# Patient Record
Sex: Male | Born: 1950 | Race: Black or African American | Hispanic: No | Smoking: Former smoker
Health system: Southern US, Community
[De-identification: ages and names within clinical notes are randomized; demographics above are authoritative.]

## PROBLEM LIST (undated history)

## (undated) DIAGNOSIS — I639 Cerebral infarction, unspecified: Secondary | ICD-10-CM

## (undated) DIAGNOSIS — I219 Acute myocardial infarction, unspecified: Secondary | ICD-10-CM

## (undated) DIAGNOSIS — R0602 Shortness of breath: Secondary | ICD-10-CM

## (undated) DIAGNOSIS — I10 Essential (primary) hypertension: Secondary | ICD-10-CM

## (undated) DIAGNOSIS — K219 Gastro-esophageal reflux disease without esophagitis: Secondary | ICD-10-CM

## (undated) DIAGNOSIS — N289 Disorder of kidney and ureter, unspecified: Secondary | ICD-10-CM

## (undated) DIAGNOSIS — E119 Type 2 diabetes mellitus without complications: Secondary | ICD-10-CM

## (undated) DIAGNOSIS — I509 Heart failure, unspecified: Secondary | ICD-10-CM

## (undated) DIAGNOSIS — E7800 Pure hypercholesterolemia, unspecified: Secondary | ICD-10-CM

## (undated) DIAGNOSIS — Z889 Allergy status to unspecified drugs, medicaments and biological substances status: Secondary | ICD-10-CM

## (undated) HISTORY — PX: CATARACT EXTRACTION W/ INTRAOCULAR LENS  IMPLANT, BILATERAL: SHX1307

---

## 1999-06-24 ENCOUNTER — Encounter: Admission: RE | Admit: 1999-06-24 | Discharge: 1999-09-22 | Payer: Self-pay | Admitting: Unknown Physician Specialty

## 2001-09-13 ENCOUNTER — Encounter: Admission: RE | Admit: 2001-09-13 | Discharge: 2001-12-12 | Payer: Self-pay | Admitting: Family Medicine

## 2006-03-08 HISTORY — PX: CORONARY ARTERY BYPASS GRAFT: SHX141

## 2006-04-28 ENCOUNTER — Ambulatory Visit: Payer: Self-pay | Admitting: Vascular Surgery

## 2006-04-28 ENCOUNTER — Encounter (INDEPENDENT_AMBULATORY_CARE_PROVIDER_SITE_OTHER): Payer: Self-pay | Admitting: Interventional Cardiology

## 2006-04-28 ENCOUNTER — Inpatient Hospital Stay (HOSPITAL_COMMUNITY): Admission: AD | Admit: 2006-04-28 | Discharge: 2006-04-29 | Payer: Self-pay | Admitting: Internal Medicine

## 2006-04-28 ENCOUNTER — Encounter: Payer: Self-pay | Admitting: Vascular Surgery

## 2006-05-01 ENCOUNTER — Emergency Department (HOSPITAL_COMMUNITY): Admission: EM | Admit: 2006-05-01 | Discharge: 2006-05-02 | Payer: Self-pay | Admitting: Emergency Medicine

## 2006-12-29 ENCOUNTER — Inpatient Hospital Stay (HOSPITAL_COMMUNITY): Admission: AD | Admit: 2006-12-29 | Discharge: 2007-01-07 | Payer: Self-pay | Admitting: Cardiology

## 2006-12-29 ENCOUNTER — Ambulatory Visit: Payer: Self-pay | Admitting: Surgery

## 2006-12-29 ENCOUNTER — Ambulatory Visit: Payer: Self-pay | Admitting: Cardiothoracic Surgery

## 2006-12-30 ENCOUNTER — Encounter (INDEPENDENT_AMBULATORY_CARE_PROVIDER_SITE_OTHER): Payer: Self-pay | Admitting: Cardiology

## 2007-01-27 ENCOUNTER — Encounter: Admission: RE | Admit: 2007-01-27 | Discharge: 2007-01-27 | Payer: Self-pay | Admitting: Cardiothoracic Surgery

## 2007-01-27 ENCOUNTER — Ambulatory Visit: Payer: Self-pay | Admitting: Cardiothoracic Surgery

## 2007-03-09 DIAGNOSIS — I639 Cerebral infarction, unspecified: Secondary | ICD-10-CM

## 2007-03-09 HISTORY — DX: Cerebral infarction, unspecified: I63.9

## 2007-03-27 ENCOUNTER — Ambulatory Visit (HOSPITAL_COMMUNITY): Admission: RE | Admit: 2007-03-27 | Discharge: 2007-03-27 | Payer: Self-pay | Admitting: Cardiology

## 2009-04-02 ENCOUNTER — Emergency Department (HOSPITAL_COMMUNITY)
Admission: EM | Admit: 2009-04-02 | Discharge: 2009-04-02 | Payer: Self-pay | Source: Home / Self Care | Admitting: Emergency Medicine

## 2010-03-23 ENCOUNTER — Encounter (HOSPITAL_COMMUNITY): Admission: RE | Admit: 2010-03-23 | Payer: Self-pay | Source: Home / Self Care | Admitting: Cardiology

## 2010-04-08 ENCOUNTER — Encounter: Payer: Self-pay | Admitting: Cardiology

## 2010-05-24 LAB — GLUCOSE, CAPILLARY: Glucose-Capillary: 208 mg/dL — ABNORMAL HIGH (ref 70–99)

## 2010-05-24 LAB — COMPREHENSIVE METABOLIC PANEL WITH GFR
ALT: 13 U/L (ref 0–53)
AST: 16 U/L (ref 0–37)
Albumin: 3.7 g/dL (ref 3.5–5.2)
Alkaline Phosphatase: 86 U/L (ref 39–117)
BUN: 2 mg/dL — ABNORMAL LOW (ref 6–23)
CO2: 30 meq/L (ref 19–32)
Calcium: 8.6 mg/dL (ref 8.4–10.5)
Chloride: 101 meq/L (ref 96–112)
Creatinine, Ser: 0.82 mg/dL (ref 0.4–1.5)
GFR calc non Af Amer: >60 mL/min
Glucose, Bld: 234 mg/dL — ABNORMAL HIGH (ref 70–99)
Potassium: 3.9 meq/L (ref 3.5–5.1)
Sodium: 136 meq/L (ref 135–145)
Total Bilirubin: 0.5 mg/dL (ref 0.3–1.2)
Total Protein: 6.7 g/dL (ref 6.0–8.3)

## 2010-05-24 LAB — DIFFERENTIAL
Eosinophils Absolute: 0.3 10*3/uL (ref 0.0–0.7)
Lymphocytes Relative: 40 % (ref 12–46)
Monocytes Relative: 7 % (ref 3–12)
Neutro Abs: 2.7 10*3/uL (ref 1.7–7.7)

## 2010-05-24 LAB — CBC
HCT: 32.7 % — ABNORMAL LOW (ref 39.0–52.0)
Hemoglobin: 10.8 g/dL — ABNORMAL LOW (ref 13.0–17.0)
MCHC: 33.1 g/dL (ref 30.0–36.0)
MCV: 84.7 fL (ref 78.0–100.0)
Platelets: 159 K/uL (ref 150–400)
RBC: 3.86 MIL/uL — ABNORMAL LOW (ref 4.22–5.81)
RDW: 14 % (ref 11.5–15.5)
WBC: 6 K/uL (ref 4.0–10.5)

## 2010-07-21 NOTE — Discharge Summary (Signed)
[name], [name]                ACCOUNT NO.:  [id number]   MEDICAL RECORD NO.:  [id number]          PATIENT TYPE:  INP   LOCATION:  2007                         FACILITY:  [facility]   PHYSICIAN:  Ivin Poot, M.D.  DATE OF BIRTH:  1950-05-18   DATE OF ADMISSION:  12/29/2006  DATE OF DISCHARGE:                               DISCHARGE SUMMARY   HISTORY OF PRESENT ILLNESS:  The patient is a 60 year old black male  with a history of diabetes and hypertension who was recently felt to  require admission for evaluation of coronary artery disease.  The  patient has multiple risk factors including the diabetes, hypertension,  hyperlipidemia and a previous stroke of the right sided basal ganglia in  February 2008.  The patient had a 2D echocardiogram which revealed a  well preserved left ventricular function without valvular insufficiency.  He recently has developed exertional chest pain that was worrisome for  angina and doctor recommended admission for cardiac catheterization.  He  was admitted for this and further treatment pending the results of this  study.   PAST MEDICAL HISTORY:  1. Hypertension.  2. Diabetes mellitus.  3. History of tobacco use, quit 10 years ago.  4. History of alcohol abuse.  5. History of CVA as described above.  6. History of diabetic neuropathy.   HOME MEDICATIONS:  Included:  1. Toprol XL 50 mg daily.  2. Enalapril 10 mg daily.  3. Aspirin 81 mg daily.  4. Plavix 75 mg daily.  5. Metformin 1 gram b.i.d.  6. Glipizide 10 mg daily.  7. Neurontin 300 mg at bedtime.  8. Nitroglycerin sublingual 0.4 mg p.r.n.   ALLERGIES:  None.   FAMILY HISTORY:  Please see the history and physical done at the time of  admission.   SOCIAL HISTORY:  Please see the history and physical done at the time of  admission.   REVIEW OF SYSTEMS:  Please see the history and physical done at the time  of admission.   PHYSICAL EXAMINATION:  Please see the history and physical  done at the  time of admission.   HOSPITAL COURSE:  The patient was admitted for the cardiac  catheterization as described.  This was performed on the day of  admission.  This demonstrated multivessel coronary artery disease with a  70-80% stenosis of the LAD and 80% stenosis of the ramus intermediate  and distal 80-90% stenosis of the right coronary artery extending into  the posterior descending and posterolateral bifurcation.  Left  ventricular ejection fraction was 50% with some inferior wall  hypokinesia and no evidence of mitral regurgitation or aortic stenosis.  Based on the coronary anatomy and symptoms, he was recommended surgical  revascularization.  Dr. Tharon Aquas Trigt evaluated the patient and his  studies and agreed with recommendations.  The patient was felt to  require a short term Plavix washout time period but was otherwise stable  medically and the surgery was done on January 02, 2007.  The following  procedure was performed: Coronary artery bypass grafting x4, the  following grafts were placed:  (  1) left internal mammary artery to the  LAD, (2) saphenous vein graft to the posterior descending, (3) a  saphenous vein graft to the ramus and (4) a saphenous vein graft to the  obtuse marginal #1.  The patient tolerated the procedure well, was taken  to the surgical intensive care unit in stable condition.   POSTOPERATIVE HOSPITAL COURSE:  The patient has overall done well.  His  inotropic support was weaned without difficulty.  He was weaned from the  ventilator without difficulty.  All routine lines, monitors and drainage  devices have been discontinued in the standard fashion.  He initially  did have some junctional rhythm but this has resolved.  He now maintains  normal sinus rhythm.  All routine lines, monitors and drainage devices  have been discontinued in the standard fashion.  His incisions are  healing well without evidence of infection.  He is tolerating  cardiac  rehabilitation phase 1 modalities per standard protocols.  He does have  an acute blood loss anemia.  His hemoglobin and hematocrit has  stabilized and he has been started on iron.  His most recent values on  January 06, 2007 are 7.6 and 22.8.  His electrolytes, BUN and creatinine  are stable and within normal limits.  He has had some postoperative  fever.  It was felt primarily to be related to a pulmonary source.  He  does have a slight infiltrate on is left lower lobe with some  atelectasis.  He has been started on a course of Avelox which will be  continued as an outpatient.  His diabetes is under adequate control.  Tentatively he is felt stable for discharge in the morning of January 07, 2007 pending morning round reevaluation.   INSTRUCTIONS:  The patient will receive read instructions in regards to  medications, activity, diet, wound care and followup.  Followup will  include Dr. Terrence Dupont 2 weeks, Dr. Prescott Gum on January 27, 2007 at 12:15  with a chest x-ray.   MEDICATIONS:  At the time of this dictation for discharge will be as  follows:  1. Aspirin 81 mg daily.  2. Lopressor 25 mg twice daily.  3. Zocor 20 mg daily.  4. Neurontin 300 mg daily at bedtime.  5. Glucotrol 10 mg daily.  6. Plavix 75 mg daily.  7. Nu-Iron 150 mg daily.  8. Glucophage 1 gram twice daily.  9. Avelox 400 mg daily for 5 additional days.  10.Lasix 40 mg daily for 10 days.  11.K-Dur 20 mEq daily for 10 days.  12.For pain, oxycodone 1 to 2 every 6 hours as needed.   FINAL DIAGNOSIS:  Include the following:  Severe multivessel coronary  artery disease as described, now status post surgical revascularization.   OTHER DIAGNOSES:  Include:  1. Hypertension.  2. Hyperlipidemia.  3. Noninsulin dependent diabetes.  4. Hypercholesterolemia.  5. History of cerebrovascular accident.  6. History of tobacco abuse.  7. History of alcohol abuse.  8. Positive family history for coronary artery  disease.  9. Postoperative acute blood loss anemia.  10.Postoperative fever with presumed pulmonary etiology which is      improving.      John Giovanni, P.A.-C.      Ivin Poot, M.D.  Electronically Signed    WEG/MEDQ  D:  01/06/2007  T:  01/07/2007  Job:  [id number]   cc:   Ivin Poot, M.D.  Allegra Lai. Terrence Dupont, M.D.  Robert A. Alyson Ingles, M.D.

## 2010-07-21 NOTE — Assessment & Plan Note (Signed)
OFFICE VISIT   [name], [name]  DOB:  Aug 11, 1950                                        January 27, 2007  CHART #:  [id number]   CURRENT PROBLEMS:  1. CABG x4 for severe 3-vessel coronary artery disease with unstable      angina.  2. Hypertension status post CVA.  3. Diabetes.   HISTORY OF PRESENT ILLNESS:  Mr. Steininger is a 60 year old black male who  returns from his first office visit after undergoing urgent 4 vessel  coronary artery bypass grafting on 01/01/06 for unstable angina with 3  vessel disease.  He is 60 years old who presented with dyspnea on  exertion and progressive chest pain.  He was found by Dr. Terrence Dupont to  have 3 vessel disease.  He underwent left IMA grafting due to his LAD  being grafted in the ramus intermediate circumflex marginal and  posterior descending and had an uncomplicated postoperative recovery in  the hospital.  He was discharged home on the 5th postoperative day in  sinus rhythm on Lopressor 25 b.i.d., Zocor 20 mg, aspirin 81 mg,  Glucotrol, Glucophage and short care of Lasix and potassium.  He has had  no recurrent chest pain since returning home and no CHF symptoms.  The  surgical incisions are healing well.  He is anxious to increase his  activity level.   On exam his blood pressure is 126/80, pulse 90, respirations 18,  saturation 98%.  He is alert and oriented.  Breath sounds are clear and  equal.  The sternum is stable and well-healed.  Cardiac rhythm is  regular without S3 gallop or rub.  The leg incision is well healed and  there is no peripheral edema.  There is no neurologic motor deficit on  exam.   His chest x-ray reveals stable cardiac silhouette with sternal wires  well aligned.  There is no pleural effusion and some minimal atelectasis  at the left costophrenic angle.   PLAN:  The patient is recommended to resume driving and light  activities.  He knows not to lift more than 20 pounds until mid  January.  He was encouraged to start the cardiac rehab program  or to take a daily walk on his own of 20 minutes.  He will maintain his  current medications.  He will return here as needed.   Ivin Poot, M.D.  Electronically Signed   PV/MEDQ  D:  01/27/2007  T:  01/28/2007  Job:  [id number]   cc:   Allegra Lai. Terrence Dupont, M.D.  [facility]

## 2010-07-21 NOTE — Op Note (Signed)
[name], [name]                ACCOUNT NO.:  [id number]   MEDICAL RECORD NO.:  [id number]          PATIENT TYPE:  INP   LOCATION:  2307                         FACILITY:  [facility]   PHYSICIAN:  Ivin Poot, M.D.  DATE OF BIRTH:  [birth year]   DATE OF PROCEDURE:  01/02/2007  DATE OF DISCHARGE:                               OPERATIVE REPORT   OPERATION:  1. Coronary artery bypass grafting x4 (left internal mammary artery to      left anterior descending, saphenous vein graft to ramus      intermediate, saphenous vein graft to circumflex marginal,      saphenous vein graft to posterior descending).   SURGEON:  Ivin Poot, M.D.   ASSISTANT:  Gwendolyn Lima MD and Crista Curb, surgical assistant.   PRE AND POSTOPERATIVE DIAGNOSIS:  Severe three-vessel coronary disease  with unstable angina.   ANESTHESIA:  General.   INDICATIONS:  The patient is a 60 year old hypertensive male who  presents with chest pain and progressive dyspnea on exertion with prior  history of coronary disease and percutaneous intervention.  Cardiac  catheterization by Dr. Terrence Dupont demonstrated severe three-vessel coronary  disease with fairly well-preserved LV function.  He is felt to be  candidate for surgical revascularization.  Prior to surgery I examined  the patient in his CCU room and reviewed results of the results of the  cardiac cath and preoperative echo with the patient.  I discussed  indications, benefits and alternatives to coronary artery bypass surgery  for treatment of his coronary disease.  I also reviewed the major  aspects of planned procedure including the choice of conduit, location  of the surgical incisions, use of general anesthesia and cardiopulmonary  bypass, and expected postoperative recovery.  I reviewed with [name]  the risks to him of coronary artery bypass surgery including risks of  MI, CVA, bleeding, infection, and death.  After reviewing these issues,  he  demonstrated his understanding and agreed to proceed with operation  as planned under what I felt was an informed consent.   OPERATIVE FINDINGS:  The vein was harvested endoscopically and was small  in caliber.  The mammary artery was 1.5 mm and had good flow.  The  patient had cardiomegaly.  The coronaries were heavily calcified.  The  circumflex vessel was a 1-mm vessel and was a borderline target.  The  distal right coronary vessels were too small to graft.  The patient  received one unit packed cells during surgery for a hemoglobin of 7  grams.   PROCEDURE:  The patient was brought to operative room placed on the  operating table where general anesthesia was induced.  The chest,  abdomen and legs were prepped with Betadine and draped as a sterile  field.  A sternal incision was made as the saphenous vein was harvested  endoscopically from the right leg.  The left internal mammary artery was  harvested as a pedicle graft from its origin at the subclavian vessels.  It was a good vessel with good flow.  Heparin was administered and the  ACT  was documented as being therapeutic.  The sternal retractor was  placed and the pericardium was opened and suspended.  When the vein had  been harvested and inspected, pursestrings placed in the ascending aorta  and right atrium and the patient was cannulated and placed on bypass.  The coronaries were identified for grafting.  The mammary artery and  vein grafts were prepared for the distal anastomoses.  Cardioplegia  catheters were placed for both antegrade and retrograde cold blood  cardioplegia.  The patient was cooled to 32 degrees and aortic  crossclamp was applied.  850 mL of cold blood cardioplegia was delivered  in split doses between the antegrade aortic and retrograde coronary  sinus catheters.  There is good cardioplegic arrest and septal  temperature dropped less than 12 degrees.  Cardioplegia was redosed  every 20 minutes while the  crossclamp was in place.   The distal coronary anastomoses were performed.  The first distal  anastomosis was the posterior descending.  This was a good target with a  1.5 mm diameter.  Reverse saphenous vein was sewn with 7-0 Prolene with  good flow through the graft.  The second distal anastomosis was to the  circumflex marginal.  This was a 1-mm vessel and was a borderline  target.  A reverse saphenous vein was sewn with running 7-0 Prolene with  fair flow through graft.  The third distal anastomosis was the ramus  intermediate.  This is a better vessel measuring 1.5 mm in diameter and  was intramyocardial.  A reverse saphenous vein was sewn end-to-side with  running 7-0 Prolene.  There was good flow through graft.  Cardioplegia  was redosed.  The fourth distal anastomosis was the distal third of the  LAD.  This a 1.7-mm vessel with proximal 80% stenosis.  The left IMA  pedicle was brought through an opening created in the left lateral  pericardium, was brought down onto the LAD and sewn end-to-side with  running 8-0 Prolene.  There was good flow through the anastomosis after  briefly releasing the pedicle bulldog on the mammary artery.  The  bulldog was reapplied and the pedicle secured to the epicardium.  Cardioplegia was redosed.   While the crossclamp was still in place the proximal vein anastomoses  were performed on the ascending aorta using a 4.0-mm punch running 7-0  Prolene.  Prior to tying down the final proximal anastomosis, air was  vented from the coronaries with a dose of retrograde warm blood  cardioplegia.  The final proximal anastomosis was tied and the  crossclamp was removed.   The heart resumed a spontaneous rhythm.  The cardioplegia catheters were  removed.  Air was aspirated from the vein grafts with a 27 gauge needle.  The proximal and distal anastomoses were inspected and found hemostatic.  The patient was rewarmed to 37 degrees.  Temporary pacing wires  were  applied.  The lungs re-expanded.  The ventilator was resumed.  The  patient was then weaned from bypass without difficulty.  Blood pressure  and cardiac output were stable.  The patient was given protamine without  adverse reaction.  The cannulas removed.  The mediastinum was irrigated  with warm antibiotic irrigation.  Leg incision was irrigated and closed  in a standard fashion.  The superior pericardial fat was closed.  Two  mediastinal and left pleural chest tube were placed brought out through  separate incisions.  The sternum was closed with interrupted steel wire.  The pectoralis fascia  was closed in running #1 Vicryl.  Subcutaneous and  skin layers were closed in running Vicryl and sterile dressings were  applied.  Total bypass time was 140 minutes with crossclamp time of 91  minutes sign.      Ivin Poot, M.D.  Electronically Signed     PV/MEDQ  D:  01/02/2007  T:  01/03/2007  Job:  [id number]   cc:   Allegra Lai. Terrence Dupont, M.D.

## 2010-07-21 NOTE — Cardiovascular Report (Signed)
[name], [name]                ACCOUNT NO.:  [id number]   MEDICAL RECORD NO.:  [id number]          PATIENT TYPE:  INP   LOCATION:  2922                         FACILITY:  [facility]   PHYSICIAN:  Mohan N. Terrence Dupont, M.D. DATE OF BIRTH:  1950-09-27   DATE OF PROCEDURE:  12/29/2006  DATE OF DISCHARGE:                            CARDIAC CATHETERIZATION   PROCEDURE:  Left cardiac catheterization with selective left and right  coronary angiography, left ventriculography via right groin using  Judkins technique.   INDICATIONS FOR PROCEDURE:  Mr. Cubitt is a 60 year old black male with  past medical history significant for hypertension, non-insulin-dependent  diabetes mellitus, history of CVA in the past, diabetic neuropathy,  history of tobacco abuse, history of alcohol abuse.  Complains of  retrosternal chest pain described as pressure, localized, without  associated symptoms of nausea, vomiting or diaphoresis.  Denies any  shortness of breath.  Denies palpitation, lightheadedness or syncope.  Denies PND, orthopnea, leg swelling.  EKG done in my office showed  normal sinus rhythm with T-wave inversion in the inferior leads.  Due to  typical chest pain and EKG changes and multiple risk factors, discussed  with the patient regarding various options of treatment, i.e.  noninvasive stress testing versus left catheterization, possible PTCA  and stenting, its risks and benefits, and consented for PCI.   PAST MEDICAL HISTORY:  As above.   PAST SURGICAL HISTORY:  None.   ALLERGIES:  No known drug allergies.   MEDICATIONS:  He is on:  1. Toprol-XL 50 mg p.o. daily.  2. Enalapril 10 mg p.o. daily.  3. Enteric-coated aspirin 81 mg p.o. daily.  4. Plavix 75 mg p.o. daily.  5. Metformin 1000 mg p.o. b.i.d.  6. Glipizide 10 mg p.o. daily.  7. Neurontin 300 mg p.o. h.s.  8. Nitrostat sublingual p.r.n.   SOCIAL HISTORY:  He is separated, has four sons.  Smoked less than one  pack per day for 10+  years, quit in the 1980s.  Used to drink beer and  hard liquor, quit 12 years ago.  He was born in [place], [place], lives in [place].  Presently on disability.   FAMILY HISTORY:  Father died of complications of bilateral pneumonia.  Mother died at the age of 36.  She was diabetic and died of septic  shock.  One brother died of MI in his 15s.   EXAMINATION:  He is alert, awake, oriented x3 in no acute distress.  Blood pressure was 140/80, pulse was 68 and regular.  Conjunctivae were  pink.  NECK:  Supple.  No JVD, no bruit.  LUNGS:  Clear to auscultation without rhonchi or rales.  CARDIOVASCULAR:  S1, S2 was normal.  There is no S3 gallop.  ABDOMEN:  Soft, bowel sounds were present, nontender.  EXTREMITIES:  There was no clubbing, cyanosis or edema.   IMPRESSION:  1. New-onset angina.  2. Hypertension.  3. Non-insulin-dependent diabetes mellitus.  4. Hypercholesteremia.  5. History of cerebrovascular accident.  6. History of tobacco abuse.  7. Alcohol abuse.  8. Positive family history of coronary artery  disease.   Again, discussed at length with the patient regarding noninvasive stress  testing versus left catheterization , possible PTCA and stenting; its  risks and benefits, i.e. death, MI, stroke, need for emergency CABG,  risk of restenosis, local vascular complications; accepted and consented  for PCI.   PROCEDURE:  After obtaining the informed consent the patient was brought  to the catheterization laboratory and was placed on the fluoroscopy  table.  Right groin was prepped and draped in usual fashion.  Xylocaine  2% was used for local anesthesia in the right groin.  With the help of  thin-wall needle, a 6-French arterial sheath was placed.  The sheath was  aspirated and flushed.  Next, 6-French left Judkins catheter was  advanced over the wire under fluoroscopic guidance up to the ascending  aorta.  The wire was pulled out, the catheter was aspirated  and  connected to the manifold.  The catheter was further advanced and  engaged into left coronary ostium.  Multiple views of the left system  were taken.  Next, the catheter was disengaged and was pulled out over  the wire and was replaced with 6-French right Judkins catheter, which  was advanced over the wire under fluoroscopic guidance up to the  ascending aorta.  The wire was pulled out, the catheter was aspirated  and connected to the manifold.  The catheter was further advanced and  engaged into right coronary ostium.  Multiple views of the right system  were taken.  Next, the catheter was disengaged and was pulled out over  the wire and was replaced with 6-French pigtail catheter, which was  advanced over the wire under fluoroscopic guidance up to the ascending  aorta.  The catheter was further advanced across the aortic valve into  LV.  LV pressures were recorded.  Next, left ventriculograph was done in  30-degree RAO position.  Post angiographic pressures were recorded from  the LV and then pullback pressures were recorded from the aorta.  There  was no significant gradient across the aortic valve.  Next, the pigtail  catheter was pulled out over the wire, sheaths aspirated and flushed.   FINDINGS:  LV showed good LV systolic function, mild LVH, EF of  approximately 50%.  Left main was patent.  All his vessels were  diffusely diseased diabetic vessels.  LAD has 30-40% proximal sequential  stenosis and 80-85% mid focal stenosis and 70-80% apical stenosis.  The  vessel distally is small and diffusely diseased.  Diagonal one to  diagonal three were very, very small.  Left circumflex was 100% occluded  beyond the proximal portion.  High OM-1/ramus branch has long sequential  75-80% proximal and mid stenosis.  Distally, the vessel appears to be  moderate size and patent.  The OM-2 was patent.  OM-3 was 100% occluded  which was diffusely diseased proximally which appears to be small   vessel.  RCA has proximal sequential 85-90% stenosis and 70-75% mid and  distal stenosis.  PLV branch has sequential 70-75% stenosis.  PDA is  small which is patent.  RCA is providing collaterals to the left  circumflex.  The patient tolerated the procedure well.  There are no  complications.  The patient was transferred to  recovery room in stable condition.  Plan is to get a CVTS consult for  possible CABG.  The patient in the past has been noncompliant to his  medications, i.e., aspirin, Plavix, and is not a good candidate for  percutaneous intervention due to multiple atherosclerotic diffuse  disease.      Allegra Lai. Terrence Dupont, M.D.  Electronically Signed     MNH/MEDQ  D:  12/29/2006  T:  12/30/2006  Job:  [id number]   cc:   Cath Lab

## 2010-07-21 NOTE — Consult Note (Signed)
NAMETYME, FALSETTI                ACCOUNT NO.:  [id number]   MEDICAL RECORD NO.:  [id number]          PATIENT TYPE:  INP   LOCATION:  2807                         FACILITY:  [facility]   PHYSICIAN:  Ivin Poot, M.D.  DATE OF BIRTH:  [birth year]   DATE OF CONSULTATION:  12/29/2006  DATE OF DISCHARGE:                                 CONSULTATION   REASON FOR CONSULTATION:  Severe multivessel coronary artery disease,  with class III angina.   PRIMARY CARE PHYSICIAN:  Robert A. Alyson Ingles, M.D., [facility].   HISTORY OF PRESENT ILLNESS:  I was asked to evaluate this 60 year old  African American male with past history of diabetes and hypertension who  was recently found to have severe three-vessel coronary artery disease  by cardiac catheterization.  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  At that time,  he complained of having some heart fatigue.  However, his 2D  echocardiogram showed fairly well-preserved LV function without valvular  insufficiency, and his cardiac enzymes and EKG were nonspecific.  He  recently had exertional chest pain worrisome for angina.  Dr. Terrence Dupont  recommended cardiac catheterization, which was performed today.  This  demonstrated multivessel coronary artery disease, with a 70%-80%  stenosis of the LAD, 80% stenosis of the ramus intermediate, distal 80%-  90% stenosis of the right coronary artery extending into the posterior  descending and posterolateral bifurcation, LVEF of 50%, with some  inferior wall hypokinesia, and no evidence of mitral regurgitation or  aortic stenosis.  Based on his coronary anatomy and symptoms, he was  felt to be a candidate for surgical coronary revascularization.  He is  currently stable in the cardiac catheterization lab holding area pending  admission.   PAST MEDICAL HISTORY:  1.  Hypertension.  2. Diabetes mellitus.  3. History of tobacco use, quit 10 years ago.  4. History of alcohol abuse.  5. 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  6. Diabetic neuropathy.   HOME MEDICATIONS:  1. Toprol-XL 50 mg daily.  2. Enalapril 10 mg daily.  3. Aspirin 81 mg daily.  4. Plavix 75 mg daily.  5. Metformin 1 g b.i.d.  6. Glipizide 10 mg daily.  7. Neurontin 300 mg at bedtime.  8. Nitroglycerin sublingual 0.4 mg p.r.n.   ALLERGIES:  None.   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  He is separated and  has four sons, the youngest being 64 years old.  He quit smoking several  years ago, and used to drink hard liquor, but quit 12 years ago.   FAMILY HISTORY:  Positive for myocardial infarction in a brother.  Positive for hypertension and diabetes in his parents.   REVIEW OF SYSTEMS:  CONSTITUTIONAL:  Negative for fever or weight loss.  ENT:  Negative for difficulty swallowing.  He had an appointment to  see  a dentist for some poor dentition, but he was unable to keep the  appointment due to his cardiac issues.  He denies any loose teeth.  THORACIC:  Negative for history of chest trauma or abnormal chest x-ray  or recent symptoms of URI.  CARDIAC:  Positive for his multivessel  coronary disease, as described previously.  GI:  Negative for hepatitis,  jaundice, or blood per rectum.  NEUROLOGIC:  Negative for kidney stones  or BPH.  ENDOCRINE:  Positive diabetes.  Negative for thyroid disease.  VASCULAR:  Negative for claudication or TIA.  Positive for stroke.  Previous carotid Dopplers done earlier this year were negative.  HEMATOLOGIC:  Negative for bleeding disorder.  He has been taking Plavix  since the stroke, without bleeding complications.  He denies prior blood  transfusion.  NEUROLOGIC:  Positive for his basal ganglia stroke, with  right  facial droop.  Otherwise, no neurologic deficit.   PHYSICAL EXAMINATION:  VITAL SIGNS:  The patient is 5 feet 10 inches and  weighs 202 pounds.  Blood pressure 130/80, pulse 70, respirations 18.  GENERAL APPEARANCE:  A middle-aged African American male in the  catheterization lab holding area, in no acute distress.  HEENT:  With a mild right facial droop.  Otherwise normocephalic.  Dentition shows several missing teeth, and the remaining teeth have some  gum erosion but are not loose  NECK:  Without JVD, mass. or carotid bruit.  LYMPHATICS:  Show no palpable supraclavicular or cervical adenopathy.  LUNGS:  Breath sounds are clear and equal.  CARDIAC:  Reveals regular S1 and S2, without murmur or S3 gallop.  ABDOMEN:  Soft, without pulsatile mass.  There is no tenderness.  EXTREMITIES:  Reveal no clubbing, cyanosis, or edema.  Peripheral pulses  are 1-2 plus in the right pedal, nonpalpable in the left pedal,  otherwise palpable in both radials and femorals.  NEUROLOGIC:  Without motor deficit, and he is alert and oriented x3.   LABORATORY DATA:  Coronary arteriograms performed by Dr. Terrence Dupont.  Agree  with his interpretation of severe three-vessel coronary artery disease  with fairly well-preserved LV function.   PLAN:  The patient will be scheduled for surgical revascularization, but  we will need to allow several days for Plavix washout.  The patient may  be a candidate to return home on leave of absence and return for surgery  early next week.  This will be discussed and reviewed.   Thank you for the consultation.      Ivin Poot, M.D.  Electronically Signed     PV/MEDQ  D:  12/29/2006  T:  12/30/2006  Job:  [id number]   cc:   Herbie Baltimore A. Alyson Ingles, M.D.  [facility] Office  [place]. Terrence Dupont, M.D.

## 2010-07-24 NOTE — H&P (Signed)
NAME:  [name], [name]                ACCOUNT NO.:  [id number]   MEDICAL RECORD NO.:  [id number]          PATIENT TYPE:  INP   LOCATION:  0101                         [facility]AC[facility]   PHYSICIAN:  Benito Mccreedy, M.D.DATE OF BIRTH:  1950-06-22   DATE OF ADMISSION:  04/27/2006  DATE OF DISCHARGE:                              HISTORY & PHYSICAL   CHIEF COMPLAINT:  Left facial droop.   HISTORY OF PRESENT ILLNESS:  Patient is a 60 year old African American  gentleman with history of hypertension and diabetes mellitus.  He denies  any known previous history of strokes.  He presented with one day  history of left facial droop.  He has also had some mild difficulty with  his speech.  He awoke in the morning with this symptom.  He had been a  little bit weak over the last three days.  No headaches, no visual  changes.  No difficulty swallowing.  At the emergency room patient was  noted to be severely hyperglycemic.  Exam also confirmed left facial  droop.  CT scan was positive for subacute versus old internal capsule  corona radiata and basal ganglia stroke.  MRI was recommended for follow-  up and patient admitted for further evaluation and management of  presumed stroke.   ALLERGIES:  NO KNOWN DRUG ALLERGIES.   CURRENT MEDICATIONS:  Multivitamin.  Apparently he has not been  compliant with his diabetes and hypertension medications.   PAST MEDICAL HISTORY:  As noted above.   FAMILY HISTORY:  Positive for diabetes mellitus.  There is also some  family history of stroke.   SOCIAL HISTORY:  Patient does not smoke cigarettes nor drink alcohol.   REVIEW OF SYSTEMS:  Patient denies any fever or chills.  No shortness of  breath, no chest pain, no cough, no sputum production, no abdominal  pain, nausea or vomiting.  No heat or cold intolerance.   PHYSICAL EXAMINATION:  GENERAL APPEARANCE:  Not in distress.  VITAL SIGNS:  Blood pressure 128/88, pulse 86, respirations 18,  temperature  98.4 degrees F.  HEENT:  Normocephalic and atraumatic.  Pupils are equal, round and  reactive to light.  Extraocular movements intact.  Oropharynx moist.  NECK:  Supple.  No JVD.  CARDIOPULMONARY:  Auscultation was unremarkable.  ABDOMEN:  Soft and nontender.  Bowel sounds present.  EXTREMITIES:  No cyanosis, no edema.  NEUROLOGIC:  Alert and oriented x3.  No acute focal deficit except for  mild left facial droop.  His speech was slightly garbled.   Patient had CT as noted above.   LABORATORY DATA:  Sodium 134, potassium normal 3.8, chloride 100, CO2  27, glucose 428, BUN 5, creatinine 0.76, calcium 9.4.  CBC was within  normal limits essentially.  Coagulation panel was unremarkable.   IMPRESSION:  Acute neurologic deficit, likely related to stroke.  Cannot  rule out hyperglycemia as __________ in a patient with possible previous  old infarct.  Patient was admitted for MRI to confirm the stroke.  Patient definitely has an old stroke.  He denies any previous knowledge  of this, therefore, we  will go ahead and obtain a full panel of stroke  work-up.  He will be started on IV insulin and try to control his sugars  for now.  He will need to be seen by diabetes educator, continue  counseling regarding the need to be compliant to his medication regimen.      Benito Mccreedy, M.D.  Electronically Signed     GO/MEDQ  D:  04/28/2006  T:  04/28/2006  Job:  [id number]

## 2010-07-30 ENCOUNTER — Other Ambulatory Visit (HOSPITAL_COMMUNITY): Payer: Self-pay | Admitting: Cardiology

## 2010-08-24 ENCOUNTER — Other Ambulatory Visit (HOSPITAL_COMMUNITY): Payer: Self-pay

## 2010-12-16 LAB — BASIC METABOLIC PANEL
BUN: 10
BUN: 12
BUN: 9
BUN: 9
BUN: 9
CO2: 26
CO2: 27
CO2: 27
CO2: 28
CO2: 28
Calcium: 8.4
Calcium: 8.4
Calcium: 8.5
Calcium: 8.5
Calcium: 9
Chloride: 103
Chloride: 104
Chloride: 106
Chloride: 106
Chloride: 114 — ABNORMAL HIGH
Creatinine, Ser: 0.82
Creatinine, Ser: 0.87
Creatinine, Ser: 0.9
Creatinine, Ser: 0.91
Creatinine, Ser: 1
GFR calc Af Amer: >60
GFR calc Af Amer: >60
GFR calc Af Amer: >60
GFR calc Af Amer: >60
GFR calc Af Amer: >60
GFR calc non Af Amer: >60
GFR calc non Af Amer: >60
GFR calc non Af Amer: >60
GFR calc non Af Amer: >60
GFR calc non Af Amer: >60
Glucose, Bld: 108 — ABNORMAL HIGH
Glucose, Bld: 129 — ABNORMAL HIGH
Glucose, Bld: 136 — ABNORMAL HIGH
Glucose, Bld: 150 — ABNORMAL HIGH
Glucose, Bld: 156 — ABNORMAL HIGH
Potassium: 3.6
Potassium: 3.9
Potassium: 3.9
Potassium: 3.9
Potassium: 4.1
Sodium: 137
Sodium: 138
Sodium: 140
Sodium: 140
Sodium: 144

## 2010-12-16 LAB — POCT I-STAT 3, ART BLOOD GAS (G3+)
Acid-Base Excess: 1
Acid-base deficit: 1
Acid-base deficit: 1
Acid-base deficit: 3 — ABNORMAL HIGH
Acid-base deficit: 3 — ABNORMAL HIGH
Bicarbonate: 20.7
Bicarbonate: 21.1
Bicarbonate: 22.9
O2 Saturation: 100
O2 Saturation: 100
O2 Saturation: 92
O2 Saturation: 96
O2 Saturation: 99
O2 Saturation: 99
Operator id: 128731
Operator id: 3406
Patient temperature: 36
Patient temperature: 37.6
Patient temperature: 38
TCO2: 22
TCO2: 22
pCO2 arterial: 32.6 — ABNORMAL LOW
pCO2 arterial: 36.2
pH, Arterial: 7.385
pO2, Arterial: 123 — ABNORMAL HIGH
pO2, Arterial: 327 — ABNORMAL HIGH

## 2010-12-16 LAB — CBC
HCT: 22.8 — ABNORMAL LOW
HCT: 24.9 — ABNORMAL LOW
HCT: 25 — ABNORMAL LOW
HCT: 26.5 — ABNORMAL LOW
HCT: 26.8 — ABNORMAL LOW
HCT: 31.1 — ABNORMAL LOW
HCT: 31.4 — ABNORMAL LOW
HCT: 33.3 — ABNORMAL LOW
HCT: 34.5 — ABNORMAL LOW
Hemoglobin: 10.3 — ABNORMAL LOW
Hemoglobin: 10.5 — ABNORMAL LOW
Hemoglobin: 10.9 — ABNORMAL LOW
Hemoglobin: 11.3 — ABNORMAL LOW
Hemoglobin: 7.6 — CL
Hemoglobin: 8.1 — ABNORMAL LOW
Hemoglobin: 8.2 — ABNORMAL LOW
Hemoglobin: 8.5 — ABNORMAL LOW
Hemoglobin: 8.7 — ABNORMAL LOW
MCHC: 32.1
MCHC: 32.5
MCHC: 32.5
MCHC: 32.6
MCHC: 32.6
MCHC: 32.7
MCHC: 32.8
MCHC: 33.3
MCHC: 33.7
MCV: 84.2
MCV: 84.9
MCV: 85.2
MCV: 85.2
MCV: 85.3
MCV: 85.8
MCV: 85.9
MCV: 86.1
MCV: 86.3
Platelets: 131 — ABNORMAL LOW
Platelets: 140 — ABNORMAL LOW
Platelets: 148 — ABNORMAL LOW
Platelets: 153
Platelets: 160
Platelets: 179
Platelets: 179
Platelets: 209
RBC: 2.71 — ABNORMAL LOW
RBC: 2.9 — ABNORMAL LOW
RBC: 2.93 — ABNORMAL LOW
RBC: 3.08 — ABNORMAL LOW
RBC: 3.1 — ABNORMAL LOW
RBC: 3.65 — ABNORMAL LOW
RBC: 3.66 — ABNORMAL LOW
RBC: 3.93 — ABNORMAL LOW
RBC: 4.05 — ABNORMAL LOW
RDW: 13.1
RDW: 13.5
RDW: 13.5
RDW: 13.6
RDW: 13.6
RDW: 13.6
RDW: 13.8
RDW: 13.8
RDW: 13.9
WBC: 10.9 — ABNORMAL HIGH
WBC: 11.2 — ABNORMAL HIGH
WBC: 12.3 — ABNORMAL HIGH
WBC: 12.7 — ABNORMAL HIGH
WBC: 6.5
WBC: 9
WBC: 9.6
WBC: 9.9

## 2010-12-16 LAB — URINALYSIS, ROUTINE W REFLEX MICROSCOPIC
Bilirubin Urine: NEGATIVE
Glucose, UA: NEGATIVE
Hgb urine dipstick: NEGATIVE
Ketones, ur: NEGATIVE
Nitrite: NEGATIVE
pH: 6.5

## 2010-12-16 LAB — POCT I-STAT 4, (NA,K, GLUC, HGB,HCT)
Glucose, Bld: 116 — ABNORMAL HIGH
Glucose, Bld: 161 — ABNORMAL HIGH
HCT: 24 — ABNORMAL LOW
HCT: 34 — ABNORMAL LOW
HCT: 35 — ABNORMAL LOW
HCT: 35 — ABNORMAL LOW
Hemoglobin: 11.6 — ABNORMAL LOW
Hemoglobin: 11.9 — ABNORMAL LOW
Hemoglobin: 8.2 — ABNORMAL LOW
Hemoglobin: 8.5 — ABNORMAL LOW
Operator id: 3406
Operator id: 3406
Potassium: 3.7
Potassium: 3.9
Potassium: 4
Potassium: 4
Sodium: 137
Sodium: 140
Sodium: 141
Sodium: 144

## 2010-12-16 LAB — URINE CULTURE
Colony Count: 6000
Special Requests: NEGATIVE

## 2010-12-16 LAB — HEMOGLOBIN AND HEMATOCRIT, BLOOD
HCT: 21.6 — ABNORMAL LOW
Hemoglobin: 7.1 — CL

## 2010-12-16 LAB — MAGNESIUM
Magnesium: 2.4
Magnesium: 2.5
Magnesium: 2.7 — ABNORMAL HIGH

## 2010-12-16 LAB — CREATININE, SERUM
Creatinine, Ser: 0.8
Creatinine, Ser: 1
GFR calc Af Amer: >60
GFR calc Af Amer: >60
GFR calc non Af Amer: >60
GFR calc non Af Amer: >60

## 2010-12-16 LAB — TYPE AND SCREEN
ABO/RH(D): B POS
Antibody Screen: NEGATIVE

## 2010-12-16 LAB — ABO/RH: ABO/RH(D): B POS

## 2010-12-16 LAB — CULTURE, BLOOD (ROUTINE X 2)
Culture: NO GROWTH
Culture: NO GROWTH

## 2010-12-16 LAB — COMPREHENSIVE METABOLIC PANEL
AST: 17
Albumin: 3.2 — ABNORMAL LOW
Alkaline Phosphatase: 55
Calcium: 9
Sodium: 140
Total Protein: 6

## 2010-12-16 LAB — BLOOD GAS, ARTERIAL
Acid-Base Excess: 1.6
O2 Saturation: 96.1
TCO2: 26.8

## 2010-12-16 LAB — PLATELET COUNT: Platelets: 149 — ABNORMAL LOW

## 2010-12-16 LAB — POCT I-STAT GLUCOSE
Glucose, Bld: 111 — ABNORMAL HIGH
Operator id: 3406

## 2010-12-16 LAB — EXPECTORATED SPUTUM ASSESSMENT W GRAM STAIN, RFLX TO RESP C

## 2010-12-16 LAB — HEMOGLOBIN A1C: Mean Plasma Glucose: 147

## 2010-12-16 LAB — APTT: aPTT: 36

## 2010-12-16 LAB — PROTIME-INR
INR: 1.4
Prothrombin Time: 17 — ABNORMAL HIGH

## 2012-07-20 ENCOUNTER — Encounter (HOSPITAL_COMMUNITY): Payer: Self-pay | Admitting: *Deleted

## 2012-07-20 ENCOUNTER — Emergency Department (HOSPITAL_COMMUNITY)
Admission: EM | Admit: 2012-07-20 | Discharge: 2012-07-20 | Disposition: A | Discharge status: Home / Self Care | Payer: Medicare Other | Attending: Emergency Medicine | Admitting: Emergency Medicine

## 2012-07-20 ENCOUNTER — Emergency Department (HOSPITAL_COMMUNITY): Payer: Medicare Other

## 2012-07-20 DIAGNOSIS — I2510 Atherosclerotic heart disease of native coronary artery without angina pectoris: Secondary | ICD-10-CM | POA: Insufficient documentation

## 2012-07-20 DIAGNOSIS — R739 Hyperglycemia, unspecified: Secondary | ICD-10-CM

## 2012-07-20 DIAGNOSIS — R000 Tachycardia, unspecified: Secondary | ICD-10-CM | POA: Insufficient documentation

## 2012-07-20 DIAGNOSIS — R319 Hematuria, unspecified: Secondary | ICD-10-CM | POA: Insufficient documentation

## 2012-07-20 DIAGNOSIS — Z7982 Long term (current) use of aspirin: Secondary | ICD-10-CM | POA: Insufficient documentation

## 2012-07-20 DIAGNOSIS — I10 Essential (primary) hypertension: Secondary | ICD-10-CM | POA: Insufficient documentation

## 2012-07-20 DIAGNOSIS — R7309 Other abnormal glucose: Secondary | ICD-10-CM | POA: Insufficient documentation

## 2012-07-20 DIAGNOSIS — R509 Fever, unspecified: Secondary | ICD-10-CM

## 2012-07-20 LAB — URINALYSIS, ROUTINE W REFLEX MICROSCOPIC
Leukocytes, UA: NEGATIVE
Nitrite: NEGATIVE
Protein, ur: 100 mg/dL — AB
Specific Gravity, Urine: 1.035 — ABNORMAL HIGH (ref 1.005–1.030)
Urobilinogen, UA: 1 mg/dL (ref 0.0–1.0)

## 2012-07-20 LAB — CBC WITH DIFFERENTIAL/PLATELET
Basophils Absolute: 0.1 10*3/uL (ref 0.0–0.1)
Basophils Relative: 1 % (ref 0–1)
Eosinophils Relative: 1 % (ref 0–5)
HCT: 30.6 % — ABNORMAL LOW (ref 39.0–52.0)
Hemoglobin: 10.4 g/dL — ABNORMAL LOW (ref 13.0–17.0)
Lymphocytes Relative: 15 % (ref 12–46)
MCHC: 34 g/dL (ref 30.0–36.0)
MCV: 78.9 fL (ref 78.0–100.0)
Monocytes Absolute: 0.9 10*3/uL (ref 0.1–1.0)
Monocytes Relative: 9 % (ref 3–12)
RDW: 12.8 % (ref 11.5–15.5)

## 2012-07-20 LAB — COMPREHENSIVE METABOLIC PANEL
AST: 16 U/L (ref 0–37)
BUN: 12 mg/dL (ref 6–23)
CO2: 27 mEq/L (ref 19–32)
Calcium: 9.5 mg/dL (ref 8.4–10.5)
Creatinine, Ser: 1.05 mg/dL (ref 0.50–1.35)
GFR calc non Af Amer: 74 mL/min — ABNORMAL LOW (ref >90)
Total Bilirubin: 0.6 mg/dL (ref 0.3–1.2)

## 2012-07-20 LAB — URINE MICROSCOPIC-ADD ON

## 2012-07-20 MED ORDER — ACETAMINOPHEN 325 MG PO TABS
650.0000 mg | ORAL_TABLET | Freq: Four times a day (QID) | ORAL | Status: DC | PRN
  Administered 2012-07-20: 650 mg via ORAL
  Filled 2012-07-20: qty 2

## 2012-07-20 NOTE — ED Notes (Signed)
Attempted to locate pt.; pt. Not in room or bathroom across hall from room.  Will attempt to find pt.
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
## 2012-07-20 NOTE — ED Notes (Signed)
Pt. Having chills, diaphoresis today. Denies feeling febrile until now. Denies sob, flu like symptoms, no urinary symptoms, no diarrhea.

## 2012-07-21 NOTE — ED Provider Notes (Signed)
Medical screening examination/treatment/procedure(s) were performed by non-physician practitioner and as supervising physician I was immediately available for consultation/collaboration.    Kathalene Frames, MD 07/21/12 [phone]

## 2012-07-27 ENCOUNTER — Emergency Department (HOSPITAL_COMMUNITY): Payer: Medicare Other

## 2012-07-27 ENCOUNTER — Encounter (HOSPITAL_COMMUNITY): Payer: Self-pay | Admitting: Emergency Medicine

## 2012-07-27 ENCOUNTER — Inpatient Hospital Stay (HOSPITAL_COMMUNITY)
Admission: EM | Admit: 2012-07-27 | Discharge: 2012-08-11 | DRG: 853 | Disposition: A | Discharge status: Skilled Nursing Facility | Payer: Medicare Other | Attending: Internal Medicine | Admitting: Internal Medicine

## 2012-07-27 DIAGNOSIS — M9080 Osteopathy in diseases classified elsewhere, unspecified site: Secondary | ICD-10-CM | POA: Diagnosis present

## 2012-07-27 DIAGNOSIS — F418 Other specified anxiety disorders: Secondary | ICD-10-CM

## 2012-07-27 DIAGNOSIS — E1159 Type 2 diabetes mellitus with other circulatory complications: Secondary | ICD-10-CM | POA: Diagnosis present

## 2012-07-27 DIAGNOSIS — F3289 Other specified depressive episodes: Secondary | ICD-10-CM | POA: Diagnosis present

## 2012-07-27 DIAGNOSIS — Z951 Presence of aortocoronary bypass graft: Secondary | ICD-10-CM

## 2012-07-27 DIAGNOSIS — E1149 Type 2 diabetes mellitus with other diabetic neurological complication: Secondary | ICD-10-CM | POA: Diagnosis present

## 2012-07-27 DIAGNOSIS — L02619 Cutaneous abscess of unspecified foot: Secondary | ICD-10-CM | POA: Diagnosis present

## 2012-07-27 DIAGNOSIS — Z720 Tobacco use: Secondary | ICD-10-CM

## 2012-07-27 DIAGNOSIS — D649 Anemia, unspecified: Secondary | ICD-10-CM

## 2012-07-27 DIAGNOSIS — F329 Major depressive disorder, single episode, unspecified: Secondary | ICD-10-CM | POA: Diagnosis present

## 2012-07-27 DIAGNOSIS — Z8673 Personal history of transient ischemic attack (TIA), and cerebral infarction without residual deficits: Secondary | ICD-10-CM

## 2012-07-27 DIAGNOSIS — N390 Urinary tract infection, site not specified: Secondary | ICD-10-CM | POA: Diagnosis present

## 2012-07-27 DIAGNOSIS — W19XXXA Unspecified fall, initial encounter: Secondary | ICD-10-CM | POA: Diagnosis present

## 2012-07-27 DIAGNOSIS — R112 Nausea with vomiting, unspecified: Secondary | ICD-10-CM

## 2012-07-27 DIAGNOSIS — I2510 Atherosclerotic heart disease of native coronary artery without angina pectoris: Secondary | ICD-10-CM | POA: Diagnosis present

## 2012-07-27 DIAGNOSIS — A480 Gas gangrene: Secondary | ICD-10-CM

## 2012-07-27 DIAGNOSIS — E871 Hypo-osmolality and hyponatremia: Secondary | ICD-10-CM | POA: Diagnosis not present

## 2012-07-27 DIAGNOSIS — E1142 Type 2 diabetes mellitus with diabetic polyneuropathy: Secondary | ICD-10-CM | POA: Diagnosis present

## 2012-07-27 DIAGNOSIS — F411 Generalized anxiety disorder: Secondary | ICD-10-CM | POA: Diagnosis present

## 2012-07-27 DIAGNOSIS — F17200 Nicotine dependence, unspecified, uncomplicated: Secondary | ICD-10-CM | POA: Diagnosis present

## 2012-07-27 DIAGNOSIS — M869 Osteomyelitis, unspecified: Secondary | ICD-10-CM | POA: Diagnosis present

## 2012-07-27 DIAGNOSIS — E1110 Type 2 diabetes mellitus with ketoacidosis without coma: Secondary | ICD-10-CM

## 2012-07-27 DIAGNOSIS — Z794 Long term (current) use of insulin: Secondary | ICD-10-CM

## 2012-07-27 DIAGNOSIS — E1165 Type 2 diabetes mellitus with hyperglycemia: Secondary | ICD-10-CM | POA: Diagnosis present

## 2012-07-27 DIAGNOSIS — Z9119 Patient's noncompliance with other medical treatment and regimen: Secondary | ICD-10-CM

## 2012-07-27 DIAGNOSIS — E1310 Other specified diabetes mellitus with ketoacidosis without coma: Secondary | ICD-10-CM | POA: Diagnosis present

## 2012-07-27 DIAGNOSIS — E1169 Type 2 diabetes mellitus with other specified complication: Secondary | ICD-10-CM

## 2012-07-27 DIAGNOSIS — IMO0002 Reserved for concepts with insufficient information to code with codable children: Secondary | ICD-10-CM | POA: Diagnosis present

## 2012-07-27 DIAGNOSIS — Z7982 Long term (current) use of aspirin: Secondary | ICD-10-CM

## 2012-07-27 DIAGNOSIS — A419 Sepsis, unspecified organism: Principal | ICD-10-CM

## 2012-07-27 DIAGNOSIS — N289 Disorder of kidney and ureter, unspecified: Secondary | ICD-10-CM | POA: Diagnosis present

## 2012-07-27 DIAGNOSIS — I10 Essential (primary) hypertension: Secondary | ICD-10-CM

## 2012-07-27 DIAGNOSIS — Z79899 Other long term (current) drug therapy: Secondary | ICD-10-CM

## 2012-07-27 DIAGNOSIS — F1010 Alcohol abuse, uncomplicated: Secondary | ICD-10-CM

## 2012-07-27 DIAGNOSIS — L03039 Cellulitis of unspecified toe: Secondary | ICD-10-CM | POA: Diagnosis present

## 2012-07-27 DIAGNOSIS — Z91199 Patient's noncompliance with other medical treatment and regimen due to unspecified reason: Secondary | ICD-10-CM

## 2012-07-27 DIAGNOSIS — D500 Iron deficiency anemia secondary to blood loss (chronic): Secondary | ICD-10-CM | POA: Diagnosis not present

## 2012-07-27 DIAGNOSIS — E875 Hyperkalemia: Secondary | ICD-10-CM | POA: Diagnosis not present

## 2012-07-27 DIAGNOSIS — I96 Gangrene, not elsewhere classified: Secondary | ICD-10-CM

## 2012-07-27 DIAGNOSIS — R6520 Severe sepsis without septic shock: Secondary | ICD-10-CM

## 2012-07-27 DIAGNOSIS — B3749 Other urogenital candidiasis: Secondary | ICD-10-CM | POA: Diagnosis not present

## 2012-07-27 HISTORY — DX: Essential (primary) hypertension: I10

## 2012-07-27 LAB — CBC
MCHC: 35 g/dL (ref 30.0–36.0)
Platelets: 359 10*3/uL (ref 150–400)
RDW: 12.9 % (ref 11.5–15.5)
WBC: 28.3 10*3/uL — ABNORMAL HIGH (ref 4.0–10.5)

## 2012-07-27 LAB — GLUCOSE, CAPILLARY
Glucose-Capillary: >600 mg/dL (ref 70–99)
Glucose-Capillary: 433 mg/dL — ABNORMAL HIGH (ref 70–99)
Glucose-Capillary: 419 mg/dL — ABNORMAL HIGH (ref 70–99)
Glucose-Capillary: 336 mg/dL — ABNORMAL HIGH (ref 70–99)
Glucose-Capillary: 324 mg/dL — ABNORMAL HIGH (ref 70–99)

## 2012-07-27 LAB — POCT I-STAT 3, VENOUS BLOOD GAS (G3P V)
Acid-base deficit: 6 mmol/L — ABNORMAL HIGH (ref 0.0–2.0)
O2 Saturation: 59 %
Patient temperature: 99.8
TCO2: 20 mmol/L (ref 0–100)

## 2012-07-27 LAB — COMPREHENSIVE METABOLIC PANEL
AST: 21 U/L (ref 0–37)
Albumin: 2.2 g/dL — ABNORMAL LOW (ref 3.5–5.2)
Chloride: 82 mEq/L — ABNORMAL LOW (ref 96–112)
Creatinine, Ser: 1.35 mg/dL (ref 0.50–1.35)
Potassium: 4.4 mEq/L (ref 3.5–5.1)
Total Bilirubin: 0.7 mg/dL (ref 0.3–1.2)
Total Protein: 8.3 g/dL (ref 6.0–8.3)

## 2012-07-27 LAB — BASIC METABOLIC PANEL
BUN: 36 mg/dL — ABNORMAL HIGH (ref 6–23)
Calcium: 9.4 mg/dL (ref 8.4–10.5)
Creatinine, Ser: 1.13 mg/dL (ref 0.50–1.35)
GFR calc Af Amer: 79 mL/min — ABNORMAL LOW (ref >90)

## 2012-07-27 LAB — URINALYSIS, ROUTINE W REFLEX MICROSCOPIC
Glucose, UA: >1000 mg/dL — AB
Leukocytes, UA: NEGATIVE
Protein, ur: 100 mg/dL — AB
pH: 5 (ref 5.0–8.0)

## 2012-07-27 LAB — CBC WITH DIFFERENTIAL/PLATELET
Basophils Relative: 0 % (ref 0–1)
Eosinophils Relative: 0 % (ref 0–5)
Hemoglobin: 8.4 g/dL — ABNORMAL LOW (ref 13.0–17.0)
Lymphocytes Relative: 6 % — ABNORMAL LOW (ref 12–46)
Neutrophils Relative %: 85 % — ABNORMAL HIGH (ref 43–77)
RBC: 3.14 MIL/uL — ABNORMAL LOW (ref 4.22–5.81)
WBC: 29.1 10*3/uL — ABNORMAL HIGH (ref 4.0–10.5)

## 2012-07-27 LAB — URINE MICROSCOPIC-ADD ON

## 2012-07-27 LAB — MRSA PCR SCREENING: MRSA by PCR: NEGATIVE

## 2012-07-27 MED ORDER — METOPROLOL TARTRATE 25 MG PO TABS
25.0000 mg | ORAL_TABLET | Freq: Two times a day (BID) | ORAL | Status: DC
  Administered 2012-07-27 – 2012-08-11 (×30): 25 mg via ORAL
  Filled 2012-07-27 (×31): qty 1

## 2012-07-27 MED ORDER — SODIUM CHLORIDE 0.9 % IV SOLN: INTRAVENOUS | Status: AC

## 2012-07-27 MED ORDER — SODIUM CHLORIDE 0.9 % IV SOLN
INTRAVENOUS | Status: DC
  Administered 2012-07-27: 5.4 [IU]/h via INTRAVENOUS
  Administered 2012-07-27: 10.1 [IU]/h via INTRAVENOUS
  Filled 2012-07-27: qty 1

## 2012-07-27 MED ORDER — SODIUM CHLORIDE 0.9 % IV BOLUS (SEPSIS)
2000.0000 mL | Freq: Once | INTRAVENOUS | Status: AC
  Administered 2012-07-27: 1000 mL via INTRAVENOUS

## 2012-07-27 MED ORDER — ONDANSETRON HCL 4 MG/2ML IJ SOLN
4.0000 mg | Freq: Four times a day (QID) | INTRAMUSCULAR | Status: DC | PRN
  Administered 2012-07-28 – 2012-08-11 (×5): 4 mg via INTRAVENOUS
  Filled 2012-07-27 (×5): qty 2

## 2012-07-27 MED ORDER — ENOXAPARIN SODIUM 40 MG/0.4ML ~~LOC~~ SOLN: 40.0000 mg | SUBCUTANEOUS | Status: DC

## 2012-07-27 MED ORDER — ACETAMINOPHEN 650 MG RE SUPP: 650.0000 mg | Freq: Four times a day (QID) | RECTAL | Status: DC | PRN

## 2012-07-27 MED ORDER — ASPIRIN 81 MG PO CHEW
81.0000 mg | CHEWABLE_TABLET | Freq: Every day | ORAL | Status: DC
  Administered 2012-07-27 – 2012-08-10 (×13): 81 mg via ORAL
  Filled 2012-07-27 (×13): qty 1

## 2012-07-27 MED ORDER — PIPERACILLIN-TAZOBACTAM 3.375 G IVPB
3.3750 g | Freq: Three times a day (TID) | INTRAVENOUS | Status: DC
  Administered 2012-07-27 – 2012-08-08 (×34): 3.375 g via INTRAVENOUS
  Filled 2012-07-27 (×39): qty 50

## 2012-07-27 MED ORDER — DEXTROSE 50 % IV SOLN: 25.0000 mL | INTRAVENOUS | Status: DC | PRN

## 2012-07-27 MED ORDER — OXYCODONE HCL 5 MG PO TABS
5.0000 mg | ORAL_TABLET | ORAL | Status: DC | PRN
  Administered 2012-07-28 – 2012-08-10 (×27): 5 mg via ORAL
  Filled 2012-07-27 (×28): qty 1

## 2012-07-27 MED ORDER — PIPERACILLIN-TAZOBACTAM 3.375 G IVPB 30 MIN
3.3750 g | Freq: Once | INTRAVENOUS | Status: AC
  Administered 2012-07-27: 3.375 g via INTRAVENOUS
  Filled 2012-07-27: qty 50

## 2012-07-27 MED ORDER — ENOXAPARIN SODIUM 40 MG/0.4ML ~~LOC~~ SOLN
40.0000 mg | SUBCUTANEOUS | Status: DC
  Administered 2012-07-27 – 2012-08-10 (×15): 40 mg via SUBCUTANEOUS
  Filled 2012-07-27 (×16): qty 0.4

## 2012-07-27 MED ORDER — DEXTROSE-NACL 5-0.45 % IV SOLN
INTRAVENOUS | Status: DC
  Administered 2012-07-28: 1000 mL via INTRAVENOUS

## 2012-07-27 MED ORDER — SODIUM CHLORIDE 0.9 % IV SOLN
INTRAVENOUS | Status: DC
  Administered 2012-07-27 – 2012-07-28 (×2): 1000 mL via INTRAVENOUS
  Administered 2012-07-28 – 2012-07-29 (×2): via INTRAVENOUS
  Administered 2012-07-30: 1000 mL via INTRAVENOUS
  Administered 2012-07-30 – 2012-07-31 (×2): 100 mL/h via INTRAVENOUS
  Administered 2012-08-11: 08:00:00 via INTRAVENOUS

## 2012-07-27 MED ORDER — ONDANSETRON HCL 4 MG PO TABS
4.0000 mg | ORAL_TABLET | Freq: Four times a day (QID) | ORAL | Status: DC | PRN
  Administered 2012-08-09: 4 mg via ORAL
  Filled 2012-07-27: qty 1

## 2012-07-27 MED ORDER — VANCOMYCIN HCL IN DEXTROSE 1-5 GM/200ML-% IV SOLN: 1000.0000 mg | Freq: Once | INTRAVENOUS | Status: DC

## 2012-07-27 MED ORDER — ACETAMINOPHEN 325 MG PO TABS
650.0000 mg | ORAL_TABLET | Freq: Four times a day (QID) | ORAL | Status: DC | PRN
  Administered 2012-07-27 – 2012-08-11 (×11): 650 mg via ORAL
  Filled 2012-07-27 (×13): qty 2

## 2012-07-27 MED ORDER — SODIUM CHLORIDE 0.9 % IV SOLN
INTRAVENOUS | Status: DC
  Administered 2012-07-27: 5.5 [IU]/h via INTRAVENOUS
  Administered 2012-07-28: 2.9 [IU]/h via INTRAVENOUS
  Administered 2012-07-28 (×2): 1.3 [IU]/h via INTRAVENOUS
  Filled 2012-07-27 (×2): qty 1

## 2012-07-27 MED ORDER — SODIUM CHLORIDE 0.9 % IJ SOLN
3.0000 mL | Freq: Two times a day (BID) | INTRAMUSCULAR | Status: DC
  Administered 2012-07-28 – 2012-08-11 (×12): 3 mL via INTRAVENOUS

## 2012-07-27 MED ORDER — VANCOMYCIN HCL IN DEXTROSE 1-5 GM/200ML-% IV SOLN
1000.0000 mg | Freq: Two times a day (BID) | INTRAVENOUS | Status: DC
  Administered 2012-07-27 – 2012-08-04 (×15): 1000 mg via INTRAVENOUS
  Filled 2012-07-27 (×18): qty 200

## 2012-07-27 MED ORDER — POTASSIUM CHLORIDE 10 MEQ/100ML IV SOLN
10.0000 meq | INTRAVENOUS | Status: AC
  Administered 2012-07-27 – 2012-07-28 (×4): 10 meq via INTRAVENOUS
  Filled 2012-07-27: qty 100
  Filled 2012-07-27: qty 400

## 2012-07-27 MED ORDER — INSULIN ASPART 100 UNIT/ML ~~LOC~~ SOLN: 10.0000 [IU] | Freq: Once | SUBCUTANEOUS | Status: DC

## 2012-07-27 NOTE — H&P (Signed)
[facility] History and Physical  Mandell Leeb [id number] DOB: 07-03-1950 DOA: 07/27/2012  Referring physician: ED PCP: No PCP Per Patient , sees Dr Terrence Dupont as his cardiolist  Chief Complaint:  Fall at home with enck pain x 1 day Pain and discoloration of right toe since 3 days  HPI:  62 year old male wit history of diabetes mellitus ( post being on oral hypoglycemics until one year back and stopped taking his meds), CAD status post CABG X4 in 2008 ( reports being on aspirin and following with Dr. Terrence Dupont as outpatient), history of CVA, history of tobacco and alcohol use presented to the ED after a mechanical fall at home yesterday and hurt his neck. Also complained of pain over his right toe that he has noticed since 3 days. Patient is a poor historian. Patient also reports off off-and-on nausea and vomiting for past one week associated with increased frequency of urination and dry mouth. Patient denies any chest pain, palpitations, dizziness, headache, blurry vision, fever, chills, abdominal pain, diarrhea shortness of breath or joint pain.  Course in the ED Patient noted to have gangrene of his right third toe. Noted low grade  Temperature. Labs done showed a marked leukocytosis with WBC of 29th K., anemia, blood glucose >600, with anion gap of 30, ketonuria and mild acidosis suggestive of DKA. Also noted for elevated lactate. Blood cultures sent and patient empirically given IV vancomycin and Zosyn. -Patient started on IV insulin for DKA. -CT of his head and neck given fall was done which was unremarkable. -[facility] called for admission stepdown given sepsis and DKA. -Orthopedics Dr. Berenice Primas consulted for gangrene of his right third toe.  Review of Systems:  Constitutional: Denies fever, chills, diaphoresis, appetite change. C/o fatigue. HEENT: Denies photophobia, eye pain, redness, hearing loss, ear pain, congestion, sore throat, rhinorrhea, sneezing, mouth sores,  trouble swallowing, neck pain+, neck stiffness and tinnitus.   Respiratory: Denies SOB, DOE, cough, chest tightness,  and wheezing.   Cardiovascular: Denies chest pain, palpitations and leg swelling.  Gastrointestinal: positive for  nausea, vomiting, denies abdominal pain, diarrhea, constipation, blood in stool and abdominal distention.  Genitourinary: Complains of polyuria,  polydipsia, Denies dysuria, urgency, frequency, hematuria, flank pain and difficulty urinating.  Musculoskeletal: Pain in right foot , Denies myalgias, back pain, joint swelling, arthralgias and gait problem.  Skin: Denies pallor, rash   Neurological: Denies dizziness, seizures, syncope, weakness, light-headedness, numbness and headaches.  Hematological: Denies adenopathy. Easy bruising, personal or family bleeding history  Psychiatric/Behavioral: Denies suicidal ideation, mood changes, confusion, nervousness, sleep disturbance and agitation   Past medical hx CAD S/p CABG HTN Uncontrolled DM  Past surgical hx  CABG X 4 in 2008 by Dr Lawson Fiscal   Social History:  HX of tobacco and etoh abuse, now quit. He reports that he does use illicit drugs.  No Known Allergies  No family history on file.  Prior to Admission medications   Medication Sig Start Date End Date Taking? Authorizing Provider  aspirin 81 MG chewable tablet Chew 81 mg by mouth daily.   Yes Historical Provider, MD    Physical Exam:  Filed Vitals:   07/27/12 1605 07/27/12 1615  BP: 154/72 182/81  Pulse: 107 110  Temp: 99.8 F (37.7 C)   TempSrc: Oral   Resp: 14   SpO2: 98% 99%    Constitutional: Vital signs reviewed.  Patient is a well-developed and well-nourished in no acute distress and cooperative with exam. Head: Normocephalic and atraumatic Ear: TM normal bilaterally  Mouth: no erythema or exudates, dry oral mucosa Eyes: PERRL, EOMI, conjunctivae normal, No scleral icterus.  Neck: Supple,No JVD, mass, thyromegaly, or carotid bruit  present.  Cardiovascular: RRR, S1 normal, S2 normal, no MRG,  Pulmonary/Chest: CTAB, no wheezes, rales, or rhonchi Abdominal: Soft. Non-tender, non-distended, bowel sounds are normal, no masses, organomegaly, or guarding present.  GU: no CVA tenderness Musculoskeletal: No joint deformities, erythema, or stiffness, ROM full and no nontender Ext: Gangrenous right third toe with absent distal pulsation. Blister over her dorsum of the right foot. Trace edema over right foot extending to distal tibia. Hematology: no cervical adenopathy.  Neurological: A&O x3, Strenght is normal and symmetric bilaterally, cranial nerve II-XII are grossly intact, no focal motor deficit, sensory intact to light touch bilaterally.  Skin: Warm, dry and intact. No rash, cyanosis, or clubbing.  Psychiatric: Normal mood and affect. speech and behavior is normal. Judgment and thought content normal. Cognition and memory are normal.   Labs on Admission:  Basic Metabolic Panel:  Recent Labs Lab 07/20/12 1928 07/27/12 1631  NA 132* 126*  K 3.9 4.4  CL 95* 82*  CO2 27 14*  GLUCOSE 322* 669*  BUN 12 42*  CREATININE 1.05 1.35  CALCIUM 9.5 9.6   Liver Function Tests:  Recent Labs Lab 07/20/12 1928 07/27/12 1631  AST 16 21  ALT 12 14  ALKPHOS 117 149*  BILITOT 0.6 0.7  PROT 7.9 8.3  ALBUMIN 3.6 2.2*   No results found for this basename: LIPASE, AMYLASE,  in the last 168 hours No results found for this basename: AMMONIA,  in the last 168 hours CBC:  Recent Labs Lab 07/20/12 1928 07/27/12 1631  WBC 10.2 29.1*  NEUTROABS 7.6 24.8*  HGB 10.4* 8.4*  HCT 30.6* 24.7*  MCV 78.9 78.7  PLT 291 376   Cardiac Enzymes: No results found for this basename: CKTOTAL, CKMB, CKMBINDEX, TROPONINI,  in the last 168 hours BNP: No components found with this basename: POCBNP,  CBG:  Recent Labs Lab 07/27/12 1608 07/27/12 1812  GLUCAP >600* 516*    Radiological Exams on Admission: Dg Chest 2 View  07/27/2012    *RADIOLOGY REPORT*  Clinical Data: Fever.  Left-sided chest pain.  Diabetes.  Coronary artery disease.  CHEST - 2 VIEW  Comparison: 07/20/2012  Findings: Low lung volumes and lordotic positioning noted.  Both lungs are clear.  No evidence of pleural effusion.  Heart size is within normal limits.  Prior CABG again noted.  IMPRESSION: No active disease.   Original Report Authenticated By: Earle Gell, M.D.   Ct Head Wo Contrast  07/27/2012   *RADIOLOGY REPORT*  Clinical Data:  Fall.  Head and neck injury and pain.  CT HEAD WITHOUT CONTRAST CT CERVICAL SPINE WITHOUT CONTRAST  Technique:  Multidetector CT imaging of the head and cervical spine was performed following the standard protocol without intravenous contrast.  Multiplanar CT image reconstructions of the cervical spine were also generated.  Comparison:  Head CT on 04/27/2006  CT HEAD  Findings: There is no evidence of intracranial hemorrhage, brain edema or other signs of acute infarction.  There is no evidence of intracranial mass lesion or mass effect.  No abnormal extra-axial fluid collections are identified.  Ventricles remain normal in size.  Mild chronic small vessel disease is again seen.  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  No evidence of skull fracture or other bone lesion.  IMPRESSION:  1.  No  acute intracranial findings. 2.  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  CT CERVICAL SPINE  Findings: No evidence of cervical spine fracture or subluxation. Degenerative disc disease is seen which is most pronounced at C6-7, and to lesser degrees at C4-5 and C5-6.  Mild facet DJD noted bilaterally as well as mild atlantoaxial degenerative changes.  No other significant bone abnormality identified.  IMPRESSION:  1.  No evidence of acute cervical spine fracture or subluxation. 2.  Degenerative spondylosis as described.   Original Report  Authenticated By: Earle Gell, M.D.   Ct Cervical Spine Wo Contrast  07/27/2012   *RADIOLOGY REPORT*  Clinical Data:  Fall.  Head and neck injury and pain.  CT HEAD WITHOUT CONTRAST CT CERVICAL SPINE WITHOUT CONTRAST  Technique:  Multidetector CT imaging of the head and cervical spine was performed following the standard protocol without intravenous contrast.  Multiplanar CT image reconstructions of the cervical spine were also generated.  Comparison:  Head CT on 04/27/2006  CT HEAD  Findings: There is no evidence of intracranial hemorrhage, brain edema or other signs of acute infarction.  There is no evidence of intracranial mass lesion or mass effect.  No abnormal extra-axial fluid collections are identified.  Ventricles remain normal in size.  Mild chronic small vessel disease is again seen.  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  No evidence of skull fracture or other bone lesion.  IMPRESSION:  1.  No acute intracranial findings. 2.  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  CT CERVICAL SPINE  Findings: No evidence of cervical spine fracture or subluxation. Degenerative disc disease is seen which is most pronounced at C6-7, and to lesser degrees at C4-5 and C5-6.  Mild facet DJD noted bilaterally as well as mild atlantoaxial degenerative changes.  No other significant bone abnormality identified.  IMPRESSION:  1.  No evidence of acute cervical spine fracture or subluxation. 2.  Degenerative spondylosis as described.   Original Report Authenticated By: Earle Gell, M.D.   Dg Foot Complete Right  07/27/2012   *RADIOLOGY REPORT*  Clinical Data: Gangrene right third toe block and with blistering extending into the lateral metatarsal and tarsal region, swelling, history diabetes, recent fall yesterday, question osteomyelitis  RIGHT FOOT COMPLETE - 3+ VIEW  Comparison: None  Findings: Soft tissue gas  throughout the third toe. Additional foci of soft tissue gas are seen adjacent to the third metatarsal head and between the second and first metatarsal heads. Bones appear demineralized. Diffuse soft tissue swelling. Joint spaces preserved. Due to superimposed soft tissue gas is difficult to visualize the cortex at the distal phalanx there is question of bone destruction involving the shaft of the distal phalanx. No additional fracture dislocation or bone destruction. Small plantar calcaneal spur.  IMPRESSION: Extensive soft tissue gas in the third toe extending to the region of the third metatarsal head and between the first and second metatarsal head consistent with infection by a gas forming organism. Questionable bone destruction at the distal phalanx of the third toe raising question of osteomyelitis.   Original Report Authenticated By: Lavonia Dana, M.D.    EKG: pending  Assessment/Plan  Principal Problem:   DKA (diabetic ketoacidoses) Admit to stepdown. Started on insulin drip on admission. Place on glucose stabilizer. Monitor anion gap closely . Patient not on any meds for over a year and  non complaint with diet or medical follow up. aggressive IV hydration for DKA.  Check A1C. Diabetic coordinator consult.   Severe sepsis  marked leucocytosis, elevated lactate secondary to gangrenous toe. X ray shows osteomyelitis. Empirically covering with IV vanco and zosyn. Blood cx sent from ED Appreciate ortho recommendations. Will need amputation of the toe once medically stable    Active Problems:    H/O: CVA (cerebrovascular accident) No residual weakness. informs taking aspirin    Hypertension Not on any BP meds. Given extensive CAD will need perioperative BB. Will place on metoprolol    CAD S/P CABG x 4 in 2008 informs seeing Dr Terrence Dupont infrequently. Please consult in am if needed. Check lipid panel. Patient quite non compliant    Tobacco abuse/ Alcohol abuse Reports to have quit      Anemia Low MCV. Check stool for occult blood and iron panel    DVT prophylaxis Subcutaneous Lovenox  Diet: Diabetic  Code Status: Full code Family Communication:none at bedside Disposition Plan: currently inpatient  Louellen Molder [facility] Pager [phone]  If 7PM-7AM, please contact night-coverage [email or web address] Password [facility] 07/27/2012, 7:10 PM  Total time spent: 70 minutes

## 2012-07-27 NOTE — ED Notes (Signed)
Per EMS: pt fell at home last night around 2030. Denies loc. Pain in r side. Hx htn, dm - non compliant with meds. CBG for ems was HIGH. Rates pain in neck 4/10.

## 2012-07-27 NOTE — Progress Notes (Signed)
ANTIBIOTIC CONSULT NOTE - INITIAL  Pharmacy Consult for Vancomycin Indication: gangrene in rt third toe  No Known Allergies  Patient Measurements: Height: 5\' 11"  (180.3 cm) Weight: 174 lb 6.1 oz (79.1 kg) IBW/kg (Calculated) : 75.3 Adjusted Body Weight:   Vital Signs: Temp: 102 F (38.9 C) (05/22 2045) Temp src: Oral (05/22 2045) BP: 139/75 mmHg (05/22 2015) Pulse Rate: 117 (05/22 2015) Intake/Output from previous day:   Intake/Output from this shift:    Labs:  Recent Labs  07/27/12 1631  WBC 29.1*  HGB 8.4*  PLT 376  CREATININE 1.35   Estimated Creatinine Clearance: 60.4 ml/min (by C-G formula based on Cr of 1.35). No results found for this basename: VANCOTROUGH, VANCOPEAK, VANCORANDOM, GENTTROUGH, GENTPEAK, GENTRANDOM, TOBRATROUGH, TOBRAPEAK, TOBRARND, AMIKACINPEAK, AMIKACINTROU, AMIKACIN,  in the last 72 hours   Microbiology: No results found for this or any previous visit (from the past 720 hour(s)).  Medical History: History reviewed. No pertinent past medical history.  Medications:  Scheduled:  . sodium chloride   Intravenous STAT  . aspirin  81 mg Oral Daily  . enoxaparin (LOVENOX) injection  40 mg Subcutaneous Q24H  . metoprolol tartrate  25 mg Oral BID  . piperacillin-tazobactam (ZOSYN)  IV  3.375 g Intravenous Q8H  . potassium chloride  10 mEq Intravenous Q1H  . sodium chloride  3 mL Intravenous Q12H   Assessment: 62 yr old male arrived at ED after a fall at home. 3rd right toe was painful and discolored. He had stopped his DM meds a year ago (metformin and glipizide).  Blood glucose was >600. WBC 29K. Was started on an insulin drip, zosyn and vancomycin per pharmacy.  Goal of Therapy:  Vancomycin trough level 15-20 mcg/ml  Plan:  Vancomycin 1 Gm IV q12hrs.  Levels when appropriate.   Minta Balsam 07/27/2012,9:35 PM

## 2012-07-27 NOTE — ED Provider Notes (Signed)
History     CSN: [id number]  Arrival date & time 07/27/12  1554   First MD Initiated Contact with Patient 07/27/12 1617      Chief Complaint  Patient presents with  . Fall  . Neck Pain  . Hyperglycemia    (Consider location/radiation/quality/duration/timing/severity/associated sxs/prior treatment) The history is provided by the patient.  [name] is a 62 y.o. male history diabetes with medication noncompliance here presenting with fall. He stated that he had not taken his metformin and glipizide for over a year. His sugar has been running high. He is chronically weak and got worse. Yesterday he tripped over something and hit his head and had some headache and neck pain. Called EMS today because he felt weak. He also has some subjective fevers at home. He has some occasional cough and vomiting. He also noticed several days ago when he was trying to trim his nails on his right foot the toenail came off and the right second toe has become gangrenous. Arrived collared by EMS.    History reviewed. No pertinent past medical history.  History reviewed. No pertinent past surgical history.  No family history on file.  History  Substance Use Topics  . Smoking status: Never Smoker   . Smokeless tobacco: Not on file  . Alcohol Use: No      Review of Systems  Constitutional: Positive for fever and chills.  HENT: Positive for neck pain.   Respiratory: Positive for cough.   Gastrointestinal: Positive for vomiting.  Musculoskeletal:       R second toe gangrene   All other systems reviewed and are negative.    Allergies  Review of patient's allergies indicates no known allergies.  Home Medications   Current Outpatient Rx  Name  Route  Sig  Dispense  Refill  . aspirin 81 MG chewable tablet   Oral   Chew 81 mg by mouth daily.           BP 182/81  Pulse 110  Temp(Src) 99.8 F (37.7 C) (Oral)  Resp 14  SpO2 99%  Physical Exam  Nursing note and vitals  reviewed. Constitutional: He is oriented to person, place, and time.  Chronically ill, tired. Unkempt, dehydrated   HENT:  Head: Normocephalic.  MM dry. No scalp hematoma.   Eyes: Conjunctivae are normal. Pupils are equal, round, and reactive to light.  Neck:  C collar in place. ? Midline tenderness   Cardiovascular: Regular rhythm and normal heart sounds.   + tachycardic   Pulmonary/Chest: Effort normal.  Crackles R base   Abdominal: Soft. Bowel sounds are normal. He exhibits no distension. There is no tenderness. There is no rebound and no guarding.  Musculoskeletal:  R 3rd toe with black gangrene that is tender. Diminished pulses. No obvious cellulitis in foot.   Neurological: He is alert and oriented to person, place, and time.  Skin: Skin is warm and dry.  Psychiatric: He has a normal mood and affect. His behavior is normal. Judgment and thought content normal.    ED Course  Procedures (including critical care time)  CRITICAL CARE Performed by: Darl Householder, DAVID   Total critical care time: 45 min   Critical care time was exclusive of separately billable procedures and treating other patients.  Critical care was necessary to treat or prevent imminent or life-threatening deterioration.  Critical care was time spent personally by me on the following activities: development of treatment plan with patient and/or surrogate as well as nursing, discussions  with consultants, evaluation of patient's response to treatment, examination of patient, obtaining history from patient or surrogate, ordering and performing treatments and interventions, ordering and review of laboratory studies, ordering and review of radiographic studies, pulse oximetry and re-evaluation of patient's condition.    Labs Reviewed  GLUCOSE, CAPILLARY - Abnormal; Notable for the following:    Glucose-Capillary >600 (*)    All other components within normal limits  CBC WITH DIFFERENTIAL - Abnormal; Notable for the  following:    WBC 29.1 (*)    RBC 3.14 (*)    Hemoglobin 8.4 (*)    HCT 24.7 (*)    Neutrophils Relative % 85 (*)    Lymphocytes Relative 6 (*)    Neutro Abs 24.8 (*)    Monocytes Absolute 2.6 (*)    All other components within normal limits  COMPREHENSIVE METABOLIC PANEL - Abnormal; Notable for the following:    Sodium 126 (*)    Chloride 82 (*)    CO2 14 (*)    Glucose, Bld 669 (*)    BUN 42 (*)    Albumin 2.2 (*)    Alkaline Phosphatase 149 (*)    GFR calc non Af Amer 55 (*)    GFR calc Af Amer 63 (*)    All other components within normal limits  URINALYSIS, ROUTINE W REFLEX MICROSCOPIC - Abnormal; Notable for the following:    APPearance CLOUDY (*)    Glucose, UA >1000 (*)    Hgb urine dipstick MODERATE (*)    Ketones, ur 40 (*)    Protein, ur 100 (*)    All other components within normal limits  LACTIC ACID, PLASMA - Abnormal; Notable for the following:    Lactic Acid, Venous 2.4 (*)    All other components within normal limits  POCT I-STAT 3, BLOOD GAS (G3P V) - Abnormal; Notable for the following:    pH, Ven 7.323 (*)    pCO2, Ven 36.5 (*)    Bicarbonate 18.8 (*)    Acid-base deficit 6.0 (*)    All other components within normal limits  URINE CULTURE  CULTURE, BLOOD (ROUTINE X 2)  CULTURE, BLOOD (ROUTINE X 2)  URINE MICROSCOPIC-ADD ON   Dg Chest 2 View  07/27/2012   *RADIOLOGY REPORT*  Clinical Data: Fever.  Left-sided chest pain.  Diabetes.  Coronary artery disease.  CHEST - 2 VIEW  Comparison: 07/20/2012  Findings: Low lung volumes and lordotic positioning noted.  Both lungs are clear.  No evidence of pleural effusion.  Heart size is within normal limits.  Prior CABG again noted.  IMPRESSION: No active disease.   Original Report Authenticated By: Earle Gell, M.D.   Ct Head Wo Contrast  07/27/2012   *RADIOLOGY REPORT*  Clinical Data:  Fall.  Head and neck injury and pain.  CT HEAD WITHOUT CONTRAST CT CERVICAL SPINE WITHOUT CONTRAST  Technique:  Multidetector CT  imaging of the head and cervical spine was performed following the standard protocol without intravenous contrast.  Multiplanar CT image reconstructions of the cervical spine were also generated.  Comparison:  Head CT on 04/27/2006  CT HEAD  Findings: There is no evidence of intracranial hemorrhage, brain edema or other signs of acute infarction.  There is no evidence of intracranial mass lesion or mass effect.  No abnormal extra-axial fluid collections are identified.  Ventricles remain normal in size.  Mild chronic small vessel disease is again seen.  An old lacunar infarct is seen involving the right basal ganglia  and deep periventricular matter.  No evidence of skull fracture or other bone lesion.  IMPRESSION:  1.  No acute intracranial findings. 2.  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  CT CERVICAL SPINE  Findings: No evidence of cervical spine fracture or subluxation. Degenerative disc disease is seen which is most pronounced at C6-7, and to lesser degrees at C4-5 and C5-6.  Mild facet DJD noted bilaterally as well as mild atlantoaxial degenerative changes.  No other significant bone abnormality identified.  IMPRESSION:  1.  No evidence of acute cervical spine fracture or subluxation. 2.  Degenerative spondylosis as described.   Original Report Authenticated By: Earle Gell, M.D.   Ct Cervical Spine Wo Contrast  07/27/2012   *RADIOLOGY REPORT*  Clinical Data:  Fall.  Head and neck injury and pain.  CT HEAD WITHOUT CONTRAST CT CERVICAL SPINE WITHOUT CONTRAST  Technique:  Multidetector CT imaging of the head and cervical spine was performed following the standard protocol without intravenous contrast.  Multiplanar CT image reconstructions of the cervical spine were also generated.  Comparison:  Head CT on 04/27/2006  CT HEAD  Findings: There is no evidence of intracranial hemorrhage, brain edema or other signs of acute infarction.  There is no  evidence of intracranial mass lesion or mass effect.  No abnormal extra-axial fluid collections are identified.  Ventricles remain normal in size.  Mild chronic small vessel disease is again seen.  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  No evidence of skull fracture or other bone lesion.  IMPRESSION:  1.  No acute intracranial findings. 2.  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  CT CERVICAL SPINE  Findings: No evidence of cervical spine fracture or subluxation. Degenerative disc disease is seen which is most pronounced at C6-7, and to lesser degrees at C4-5 and C5-6.  Mild facet DJD noted bilaterally as well as mild atlantoaxial degenerative changes.  No other significant bone abnormality identified.  IMPRESSION:  1.  No evidence of acute cervical spine fracture or subluxation. 2.  Degenerative spondylosis as described.   Original Report Authenticated By: Earle Gell, M.D.   Dg Foot Complete Right  07/27/2012   *RADIOLOGY REPORT*  Clinical Data: Gangrene right third toe block and with blistering extending into the lateral metatarsal and tarsal region, swelling, history diabetes, recent fall yesterday, question osteomyelitis  RIGHT FOOT COMPLETE - 3+ VIEW  Comparison: None  Findings: Soft tissue gas throughout the third toe. Additional foci of soft tissue gas are seen adjacent to the third metatarsal head and between the second and first metatarsal heads. Bones appear demineralized. Diffuse soft tissue swelling. Joint spaces preserved. Due to superimposed soft tissue gas is difficult to visualize the cortex at the distal phalanx there is question of bone destruction involving the shaft of the distal phalanx. No additional fracture dislocation or bone destruction. Small plantar calcaneal spur.  IMPRESSION: Extensive soft tissue gas in the third toe extending to the region of the third metatarsal  head and between the first and second metatarsal head consistent with infection by a gas forming organism. Questionable bone destruction at the distal phalanx of the third toe raising question of osteomyelitis.   Original Report Authenticated By: Lavonia Dana, M.D.     No diagnosis found.    MDM  [name] is a 62 y.o. male hx of DM with  med uncompliance here with s/p fall. Low grade temp and tachy, concerned for sepsis. Will need to r/o pneumonia vs osteo of foot vs UTI. Will also need to r/o DKA with labs and VBG. Will get CT head/neck given injury. Will control sugar with glucose stabilizer. Will need admission.   5:40 PM Xray showed osteo R 3rd toe with gas. Given vanc/zosyn. WBC 29 with AG 30. Patient on glucose stabilizer. I called Dr. Berenice Primas, who recommend IV abx, medical admission, and he may do amputation tomorrow if stabilized.   5:50 PM I called Dr. Selinda Michaels from critical care, who said that patient doesn't need to be in ICU. Should be in a monitored bed.   6:07 PM I talked to Dr. Clementeen Graham, hospitalist, who accepted the patient on stepdown.          Wandra Arthurs, MD 07/27/12 [id number]

## 2012-07-27 NOTE — ED Notes (Signed)
Pt's CBG was reading HI and RN Marylou Flesher is aware.4:17 pm JG.

## 2012-07-27 NOTE — ED Notes (Signed)
Attempted report. RN will call back. 

## 2012-07-27 NOTE — Consult Note (Signed)
Reason for Consult:62 year old male with uncontrolled diabetes who presents with a gangrenous toe. Referring Physician: Hospitalists  [name] is an 62 y.o. male.  HPI: This 62 year old male with uncontrolled diabetes presents to the emergency room with a gangrenous right third toe.  We are consulted for management of the gangrenous toe.  The patient will be admitted to medicine and started on IV antibiotics.  We will evaluate the patient for management.  History reviewed. No pertinent past medical history.  History reviewed. No pertinent past surgical history.  No family history on file.  Social History:  reports that he has never smoked. He does not have any smokeless tobacco history on file. He reports that he does not drink alcohol or use illicit drugs.  Allergies: No Known Allergies  Medications: I have reviewed the patient's current medications.  Results for orders placed during the hospital encounter of 07/27/12 (from the past 48 hour(s))  GLUCOSE, CAPILLARY     Status: Abnormal   Collection Time    07/27/12  4:08 PM      Result Value Range   Glucose-Capillary >600 (*) 70 - 99 mg/dL  CBC WITH DIFFERENTIAL     Status: Abnormal   Collection Time    07/27/12  4:31 PM      Result Value Range   WBC 29.1 (*) 4.0 - 10.5 K/uL   RBC 3.14 (*) 4.22 - 5.81 MIL/uL   Hemoglobin 8.4 (*) 13.0 - 17.0 g/dL   HCT 24.7 (*) 39.0 - 52.0 %   MCV 78.7  78.0 - 100.0 fL   MCH 26.8  26.0 - 34.0 pg   MCHC 34.0  30.0 - 36.0 g/dL   RDW 13.2  11.5 - 15.5 %   Platelets 376  150 - 400 K/uL   Neutrophils Relative % 85 (*) 43 - 77 %   Lymphocytes Relative 6 (*) 12 - 46 %   Monocytes Relative 9  3 - 12 %   Eosinophils Relative 0  0 - 5 %   Basophils Relative 0  0 - 1 %   Neutro Abs 24.8 (*) 1.7 - 7.7 K/uL   Lymphs Abs 1.7  0.7 - 4.0 K/uL   Monocytes Absolute 2.6 (*) 0.1 - 1.0 K/uL   Eosinophils Absolute 0.0  0.0 - 0.7 K/uL   Basophils Absolute 0.0  0.0 - 0.1 K/uL   RBC Morphology  POLYCHROMASIA PRESENT     Comment: BURR CELLS   WBC Morphology TOXIC GRANULATION     Comment: ATYPICAL LYMPHOCYTES   Smear Review LARGE PLATELETS PRESENT    COMPREHENSIVE METABOLIC PANEL     Status: Abnormal   Collection Time    07/27/12  4:31 PM      Result Value Range   Sodium 126 (*) 135 - 145 mEq/L   Potassium 4.4  3.5 - 5.1 mEq/L   Chloride 82 (*) 96 - 112 mEq/L   CO2 14 (*) 19 - 32 mEq/L   Glucose, Bld 669 (*) 70 - 99 mg/dL   Comment: CRITICAL RESULT CALLED TO, READ BACK BY AND VERIFIED WITH:     K MUELLER,RN 1738 07/27/12 WBOND   BUN 42 (*) 6 - 23 mg/dL   Creatinine, Ser 1.35  0.50 - 1.35 mg/dL   Calcium 9.6  8.4 - 10.5 mg/dL   Total Protein 8.3  6.0 - 8.3 g/dL   Albumin 2.2 (*) 3.5 - 5.2 g/dL   AST 21  0 - 37 U/L   ALT 14  0 - 53 U/L   Alkaline Phosphatase 149 (*) 39 - 117 U/L   Total Bilirubin 0.7  0.3 - 1.2 mg/dL   GFR calc non Af Amer 55 (*) >90 mL/min   GFR calc Af Amer 63 (*) >90 mL/min   Comment:            The eGFR has been calculated     using the CKD EPI equation.     This calculation has not been     validated in all clinical     situations.     eGFR's persistently     <90 mL/min signify     possible Chronic Kidney Disease.  LACTIC ACID, PLASMA     Status: Abnormal   Collection Time    07/27/12  4:54 PM      Result Value Range   Lactic Acid, Venous 2.4 (*) 0.5 - 2.2 mmol/L  POCT I-STAT 3, BLOOD GAS (G3P V)     Status: Abnormal   Collection Time    07/27/12  5:08 PM      Result Value Range   pH, Ven 7.323 (*) 7.250 - 7.300   pCO2, Ven 36.5 (*) 45.0 - 50.0 mmHg   pO2, Ven 34.0  30.0 - 45.0 mmHg   Bicarbonate 18.8 (*) 20.0 - 24.0 mEq/L   TCO2 20  0 - 100 mmol/L   O2 Saturation 59.0     Acid-base deficit 6.0 (*) 0.0 - 2.0 mmol/L   Patient temperature 99.8 F     Collection site RADIAL, ALLEN'S TEST ACCEPTABLE     Drawn by RT     Sample type VENOUS     Comment NOTIFIED PHYSICIAN    URINALYSIS, ROUTINE W REFLEX MICROSCOPIC     Status: Abnormal    Collection Time    07/27/12  5:10 PM      Result Value Range   Color, Urine YELLOW  YELLOW   APPearance CLOUDY (*) CLEAR   Specific Gravity, Urine 1.026  1.005 - 1.030   pH 5.0  5.0 - 8.0   Glucose, UA >1000 (*) NEGATIVE mg/dL   Hgb urine dipstick MODERATE (*) NEGATIVE   Bilirubin Urine NEGATIVE  NEGATIVE   Ketones, ur 40 (*) NEGATIVE mg/dL   Protein, ur 100 (*) NEGATIVE mg/dL   Urobilinogen, UA 1.0  0.0 - 1.0 mg/dL   Nitrite NEGATIVE  NEGATIVE   Leukocytes, UA NEGATIVE  NEGATIVE  URINE MICROSCOPIC-ADD ON     Status: None   Collection Time    07/27/12  5:10 PM      Result Value Range   Squamous Epithelial / LPF RARE  RARE   WBC, UA 0-2  <3 WBC/hpf   RBC / HPF 0-2  <3 RBC/hpf   Bacteria, UA RARE  RARE   Urine-Other MUCOUS PRESENT      Dg Chest 2 View  07/27/2012   *RADIOLOGY REPORT*  Clinical Data: Fever.  Left-sided chest pain.  Diabetes.  Coronary artery disease.  CHEST - 2 VIEW  Comparison: 07/20/2012  Findings: Low lung volumes and lordotic positioning noted.  Both lungs are clear.  No evidence of pleural effusion.  Heart size is within normal limits.  Prior CABG again noted.  IMPRESSION: No active disease.   Original Report Authenticated By: Earle Gell, M.D.   Dg Foot Complete Right  07/27/2012   *RADIOLOGY REPORT*  Clinical Data: Gangrene right third toe block and with blistering extending into the lateral metatarsal and tarsal region, swelling, history  diabetes, recent fall yesterday, question osteomyelitis  RIGHT FOOT COMPLETE - 3+ VIEW  Comparison: None  Findings: Soft tissue gas throughout the third toe. Additional foci of soft tissue gas are seen adjacent to the third metatarsal head and between the second and first metatarsal heads. Bones appear demineralized. Diffuse soft tissue swelling. Joint spaces preserved. Due to superimposed soft tissue gas is difficult to visualize the cortex at the distal phalanx there is question of bone destruction involving the shaft of the  distal phalanx. No additional fracture dislocation or bone destruction. Small plantar calcaneal spur.  IMPRESSION: Extensive soft tissue gas in the third toe extending to the region of the third metatarsal head and between the first and second metatarsal head consistent with infection by a gas forming organism. Questionable bone destruction at the distal phalanx of the third toe raising question of osteomyelitis.   Original Report Authenticated By: Lavonia Dana, M.D.    ROS: I have reviewed the patient's review of systems thoroughly and there are no positive responses as relates to the HPI. Exam:  Blood pressure 182/81, pulse 110, temperature 99.8 F (37.7 C), temperature source Oral, resp. rate 14, SpO2 99.00%. Well-developed well-nourished patient in no acute distress. Alert and oriented x3 HEENT:within normal limits Cardiac: Regular rate and rhythm Pulmonary: Lungs clear to auscultation Abdomen: Soft and nontender.  Normal active bowel sounds  Musculoskeletal:Markedly edematous and ecchymotic third toe.  There is drainage from the nail area.  There is significant erythema and necrotic appearance to 3rd toe.  Blistered area between 4and 5th toes .  Assessment/Plan: 62 year old male with uncontrolled diabetes with severe third toe infection.//The patient will be admitted to the internal medicine service and started on IV antibiotic therapy.  Will need blood sugar control.  We will monitor him while in the hospital but he will likely need an amputation of the third toe.  We will likely get an MRI examination after his been on antibiotics for a couple days suspect Saturday AM..  Will follow and Dr. Mayer Camel will assume care after noon tomorrow.  Alvera Tourigny L 07/27/2012, 5:53 PM

## 2012-07-27 NOTE — ED Notes (Signed)
Attempted report. Charge rn will call back.

## 2012-07-27 NOTE — ED Notes (Signed)
Pt's CBG was 564 when I checked it.6:15pm JG.

## 2012-07-28 ENCOUNTER — Inpatient Hospital Stay (HOSPITAL_COMMUNITY): Payer: Medicare Other

## 2012-07-28 DIAGNOSIS — A419 Sepsis, unspecified organism: Secondary | ICD-10-CM

## 2012-07-28 DIAGNOSIS — D649 Anemia, unspecified: Secondary | ICD-10-CM

## 2012-07-28 LAB — BASIC METABOLIC PANEL
BUN: 35 mg/dL — ABNORMAL HIGH (ref 6–23)
BUN: 35 mg/dL — ABNORMAL HIGH (ref 6–23)
CO2: 25 mEq/L (ref 19–32)
Calcium: 9.1 mg/dL (ref 8.4–10.5)
Calcium: 9.2 mg/dL (ref 8.4–10.5)
Chloride: 97 mEq/L (ref 96–112)
Creatinine, Ser: 1.16 mg/dL (ref 0.50–1.35)
Creatinine, Ser: 1.23 mg/dL (ref 0.50–1.35)
GFR calc Af Amer: 73 mL/min — ABNORMAL LOW (ref >90)
GFR calc Af Amer: 71 mL/min — ABNORMAL LOW (ref >90)
GFR calc non Af Amer: 61 mL/min — ABNORMAL LOW (ref >90)
GFR calc non Af Amer: 63 mL/min — ABNORMAL LOW (ref >90)
GFR calc non Af Amer: 66 mL/min — ABNORMAL LOW (ref >90)
Glucose, Bld: 264 mg/dL — ABNORMAL HIGH (ref 70–99)
Potassium: 3.6 mEq/L (ref 3.5–5.1)
Sodium: 134 mEq/L — ABNORMAL LOW (ref 135–145)
Sodium: 134 mEq/L — ABNORMAL LOW (ref 135–145)

## 2012-07-28 LAB — CBC
HCT: 23.5 % — ABNORMAL LOW (ref 39.0–52.0)
MCHC: 34.9 g/dL (ref 30.0–36.0)
RDW: 12.9 % (ref 11.5–15.5)

## 2012-07-28 LAB — LACTIC ACID, PLASMA: Lactic Acid, Venous: 2.9 mmol/L — ABNORMAL HIGH (ref 0.5–2.2)

## 2012-07-28 LAB — FERRITIN: Ferritin: 4198 ng/mL — ABNORMAL HIGH (ref 22–322)

## 2012-07-28 LAB — GLUCOSE, CAPILLARY
Glucose-Capillary: 245 mg/dL — ABNORMAL HIGH (ref 70–99)
Glucose-Capillary: 204 mg/dL — ABNORMAL HIGH (ref 70–99)
Glucose-Capillary: 133 mg/dL — ABNORMAL HIGH (ref 70–99)
Glucose-Capillary: 125 mg/dL — ABNORMAL HIGH (ref 70–99)

## 2012-07-28 LAB — LIPID PANEL
Cholesterol: 137 mg/dL (ref 0–200)
LDL Cholesterol: 105 mg/dL — ABNORMAL HIGH (ref 0–99)
Triglycerides: 123 mg/dL (ref <150)

## 2012-07-28 LAB — URINE CULTURE
Colony Count: NO GROWTH
Culture: NO GROWTH

## 2012-07-28 LAB — IRON AND TIBC: Iron: 28 ug/dL — ABNORMAL LOW (ref 42–135)

## 2012-07-28 MED ORDER — INSULIN ASPART 100 UNIT/ML ~~LOC~~ SOLN
0.0000 [IU] | Freq: Three times a day (TID) | SUBCUTANEOUS | Status: DC
  Administered 2012-07-28: 11 [IU] via SUBCUTANEOUS
  Administered 2012-07-28: 8 [IU] via SUBCUTANEOUS
  Administered 2012-07-29: 5 [IU] via SUBCUTANEOUS
  Administered 2012-07-29: 8 [IU] via SUBCUTANEOUS
  Administered 2012-07-30: 5 [IU] via SUBCUTANEOUS
  Administered 2012-07-30: 3 [IU] via SUBCUTANEOUS
  Administered 2012-07-30: 8 [IU] via SUBCUTANEOUS
  Administered 2012-07-31: 5 [IU] via SUBCUTANEOUS
  Administered 2012-07-31: 3 [IU] via SUBCUTANEOUS
  Administered 2012-07-31: 5 [IU] via SUBCUTANEOUS
  Administered 2012-08-01: 2 [IU] via SUBCUTANEOUS
  Administered 2012-08-01 – 2012-08-04 (×7): 3 [IU] via SUBCUTANEOUS
  Administered 2012-08-04: 2 [IU] via SUBCUTANEOUS
  Administered 2012-08-05 (×2): 3 [IU] via SUBCUTANEOUS
  Administered 2012-08-06: 5 [IU] via SUBCUTANEOUS
  Administered 2012-08-06 (×2): 3 [IU] via SUBCUTANEOUS
  Administered 2012-08-07 – 2012-08-08 (×2): 2 [IU] via SUBCUTANEOUS

## 2012-07-28 MED ORDER — INSULIN GLARGINE 100 UNIT/ML ~~LOC~~ SOLN
20.0000 [IU] | Freq: Every day | SUBCUTANEOUS | Status: DC
  Filled 2012-07-28: qty 0.2

## 2012-07-28 MED ORDER — INSULIN ASPART 100 UNIT/ML ~~LOC~~ SOLN
0.0000 [IU] | Freq: Every day | SUBCUTANEOUS | Status: DC
  Administered 2012-07-28 – 2012-07-29 (×2): 2 [IU] via SUBCUTANEOUS

## 2012-07-28 MED ORDER — INSULIN DETEMIR 100 UNIT/ML ~~LOC~~ SOLN
20.0000 [IU] | Freq: Every day | SUBCUTANEOUS | Status: DC
  Administered 2012-07-28 – 2012-07-29 (×2): 20 [IU] via SUBCUTANEOUS
  Filled 2012-07-28 (×3): qty 0.2

## 2012-07-28 MED ORDER — LIVING WELL WITH DIABETES BOOK
Freq: Once | Status: AC
  Administered 2012-07-28: 13:00:00
  Filled 2012-07-28: qty 1

## 2012-07-28 NOTE — Progress Notes (Signed)
5/23 Spoke with patient about his diabetes.  States that he was diagnosed "many years ago".  Had been seeing Dr. Joneen Caraway at one time, but has not seen any doctor for several years.  States that he stopped taking his oral meds due to financial issues.  His only transportation is the city bus service. Lives alone and usually goes out to eat.  Recommend that case management see patient to determine what needs he has in reference to affording medications that he will be discharged with and to determine his insurance situation.  Needs a PCP to follow him at discharge.  Also recommend that a dietician see patient to help with meal planning.  Recommend that a HgbA1C be checked.  Will continue to follow while in hospital.  Harvel Ricks RN BSN CDE

## 2012-07-28 NOTE — Evaluation (Signed)
Physical Therapy Evaluation Patient Details Name: [name] MRN: [id number] DOB: 12/05/1950 Today's Date: 07/28/2012 Time: 1631-1700 PT Time Calculation (min): 29 min  PT Assessment / Plan / Recommendation Clinical Impression  Pt is 62 year old male wit history of diabetes mellitus ( post being on oral hypoglycemics until one year back and stopped taking his meds), CAD status post CABG X4 in 2008 ( reports being on aspirin and following with Dr. Terrence Dupont as outpatient), history of CVA, history of tobacco and alcohol use presented to the ED after a mechanical fall at home yesterday and hurt his neck. Also complained of pain over his right toe that he has noticed since 3 days. Patient is a poor historian.  Pt found to have right third toe osteomyelitis and most likely amputation per ortho note.  Pt with generalized weakness and limited due to back and right LE pain.  Pt will benefit from acute PT services to improve overall mobilty and prepare for safe d/c home    PT Assessment  Patient needs continued PT services    Follow Up Recommendations  Home health PT;Supervision/Assistance - 24 hour (May need to update post surgery)    Does the patient have the potential to tolerate intense rehabilitation      Barriers to Discharge Decreased caregiver support      Equipment Recommendations  Rolling walker with 5" wheels    Recommendations for Other Services     Frequency Min 3X/week    Precautions / Restrictions Precautions Precautions: Fall Restrictions Weight Bearing Restrictions: No   Pertinent Vitals/Pain 10/10 back pain but in bed calm with no sign of pain      Mobility  Bed Mobility Bed Mobility: Rolling Right;Right Sidelying to Sit Rolling Right: 4: Min assist;With rail Right Sidelying to Sit: 4: Min assist;HOB flat;With rails Details for Bed Mobility Assistance: (A) to complete roll and (A) to elevate trunk OOB with cues for hand placement Transfers Transfers: Sit to  Stand;Stand to Sit Sit to Stand: 4: Min assist;From bed Stand to Sit: 4: Min assist;To chair/3-in-1 Details for Transfer Assistance: (A) to initiate transfer and slowly descend to chair with max cues for hand placement Ambulation/Gait Ambulation/Gait Assistance: 4: Min assist Ambulation Distance (Feet): 4 Feet (side steps to recliner) Assistive device: Rolling walker Ambulation/Gait Assistance Details: (A) to maintain balance.  Limited ambulation due to right foot pain with weight bearing Gait Pattern: Step-to pattern;Decreased stance time - right;Shuffle;Antalgic Gait velocity: decreased due to pain Stairs: No Wheelchair Mobility Wheelchair Mobility: No    Exercises     PT Diagnosis: Difficulty walking;Generalized weakness;Acute pain  PT Problem List: Decreased strength;Decreased activity tolerance;Decreased balance;Decreased mobility;Decreased knowledge of use of DME;Pain PT Treatment Interventions: DME instruction;Gait training;Functional mobility training;Therapeutic activities;Therapeutic exercise;Balance training;Patient/family education   PT Goals Acute Rehab PT Goals PT Goal Formulation: With patient Time For Goal Achievement: 08/04/12 Potential to Achieve Goals: Good Pt will go Supine/Side to Sit: with modified independence PT Goal: Supine/Side to Sit - Progress: Goal set today Pt will go Sit to Stand: with modified independence PT Goal: Sit to Stand - Progress: Goal set today Pt will go Stand to Sit: with modified independence PT Goal: Stand to Sit - Progress: Goal set today Pt will Ambulate: >150 feet;with modified independence;with rolling walker PT Goal: Ambulate - Progress: Goal set today  Visit Information  Last PT Received On: 07/28/12 Assistance Needed: +1    Subjective Data  Subjective: "My back has been hurting since the fall."  Patient Stated Goal: To go  home   Prior [place] Lives With: Alone Available Help at Discharge:  Family;Available 24 hours/day Type of Home: Apartment Home Access: Level entry Home Layout: One level Bathroom Shower/Tub: Walk-in Sales promotion account executive: Handicapped height Bathroom Accessibility: Yes How Accessible: Accessible via wheelchair;Accessible via walker Home Adaptive Equipment: Grab bars in shower;Grab bars around toilet;Straight cane Prior Function Level of Independence: Independent with assistive device(s) (SPC) Able to Take Stairs?: No Driving: No Vocation: On disability Communication Communication: No difficulties Dominant Hand: Right    Cognition  Cognition Arousal/Alertness: Awake/alert Behavior During Therapy: WFL for tasks assessed/performed Overall Cognitive Status: Within Functional Limits for tasks assessed    Extremity/Trunk Assessment Right Lower Extremity Assessment RLE ROM/Strength/Tone: Unable to fully assess;Due to pain (At least 3/5 with functional mobility) RLE Sensation: History of peripheral neuropathy Left Lower Extremity Assessment LLE ROM/Strength/Tone: Within functional levels LLE Sensation: History of peripheral neuropathy   Balance Balance Balance Assessed: Yes Static Sitting Balance Static Sitting - Balance Support: Feet supported Static Sitting - Level of Assistance: 6: Modified independent (Device/Increase time)  End of Session PT - End of Session Equipment Utilized During Treatment: Gait belt Activity Tolerance: Patient limited by pain Patient left: in chair;with call bell/phone within reach Nurse Communication: Mobility status  GP     [name] 07/28/2012, 5:08 PM Antoine Poche, [place] DPT [phone]

## 2012-07-28 NOTE — Plan of Care (Addendum)
Problem: Food- and Nutrition-Related Knowledge Deficit (NB-1.1) Goal: Nutrition education Formal process to instruct or train a patient/client in a skill or to impart knowledge to help patients/clients voluntarily manage or modify food choices and eating behavior to maintain or improve health.  Outcome: Completed/Met Date Met:  07/28/12  RD consulted for nutrition education regarding diabetes.      Lab Results  Component Value Date    HGBA1C  Value: 6.3 (NOTE)   The ADA recommends the following therapeutic goals for glycemic  control related to Hgb A1C measurement:   Goal of Therapy:   < 7.0% Hgb A1C   Action Suggested:  > 8.0% Hgb A1C   Ref:  Diabetes Care, 43, Suppl. 1, 1999* 12/30/2006    RD provided "Carbohydrate Counting for People with Diabetes" handout from the Academy of Nutrition and Dietetics. Discussed different food groups and their effects on blood sugar, emphasizing carbohydrate-containing foods. Provided list of carbohydrates and recommended serving sizes of common foods.  Discussed importance of controlled and consistent carbohydrate intake throughout the day. Provided examples of ways to balance meals/snacks and encouraged intake of high-fiber, whole grain complex carbohydrates. RD did not review handouts with patient as RN was cleaning pt up, will return for more formal education today if time permits.  Body mass index is 25.29 kg/(m^2). Pt meets criteria for overwt based on current BMI.  Current diet order is Carbohydrate Modified Medium. Labs and medications reviewed. No further nutrition interventions warranted at this time. RD contact information provided. If additional nutrition issues arise, please re-consult RD.  Inda Coke MS, RD, LDN Pager: [phone] After-hours pager: [phone]

## 2012-07-28 NOTE — Progress Notes (Signed)
Subjective: No c/o's.  Pt non verbal today.  Objective: Vital signs in last 24 hours: Temp:  [98.1 F (36.7 C)-102.6 F (39.2 C)] 99.6 F (37.6 C) (05/23 0920) Pulse Rate:  [58-117] 89 (05/23 0920) Resp:  [14-24] 16 (05/23 0920) BP: (114-182)/(55-81) 152/70 mmHg (05/23 0920) SpO2:  [94 %-99 %] 99 % (05/23 0920) Weight:  [79.1 kg (174 lb 6.1 oz)-82.2 kg (181 lb 3.5 oz)] 82.2 kg (181 lb 3.5 oz) (05/23 0600)  Intake/Output from previous day: 05/22 0701 - 05/23 0700 In: 1555 [P.O.:480; I.V.:425; IV Piggyback:650] Out: 300 [Urine:300] Intake/Output this shift: Total I/O In: 203 [I.V.:203] Out: 350 [Urine:350]   Recent Labs  07/27/12 1631 07/27/12 2106 07/28/12 0257  HGB 8.4* 9.0* 8.2*    Recent Labs  07/27/12 2106 07/28/12 0257  WBC 28.3* 24.8*  RBC 3.35* 3.05*  HCT 25.7* 23.5*  PLT 359 367    Recent Labs  07/28/12 0058 07/28/12 0257  NA 134* 135  K 3.6 3.7  CL 96 97  CO2 26 26  BUN 35* 35*  CREATININE 1.20 1.23  GLUCOSE 264* 158*  CALCIUM 9.2 9.3   Right foot exam: Has red swollen third toe. Large superfiscial blister on dorsolateral aspect of foot. Minimal redness on dorsum of foot.  Assessment/Plan: Infection/osteomyelitis right third toe.R/O foot abcess or more proximal infection. Plan: Ordered MRI of right foot . Dr Mayer Camel to cover over holiday weekend. Pt will probably need right 3rd toe amputation by Dr Mayer Camel in 1-2 days. He will see fri am after MRI obtained.     Torian Thoennes G 07/28/2012, 11:09 AM

## 2012-07-28 NOTE — Progress Notes (Signed)
PT Cancellation Note  Patient Details Name: [name] MRN: [id number] DOB: 03-22-1950   Cancelled Treatment:    Reason Eval/Treat Not Completed: Patient at procedure or test/unavailable.  Pt off floor when attempted to see pt.  Will attempt later today if time allows & pt able.  Thanks!!   Omari Mcmanaway 07/28/2012, 2:00 PM [place], [place] DPT [phone]

## 2012-07-28 NOTE — Progress Notes (Signed)
Utilization Review Completed. 07/28/2012

## 2012-07-28 NOTE — Progress Notes (Signed)
[facility] PROGRESS NOTE  [name] [id number] DOB: 02/24/1951 DOA: 07/27/2012 PCP: No PCP Per Patient   Brief narrative: 62 year old male with history of diabetes mellitus ( was  on oral hypoglycemics until one year back and stopped taking his meds), CAD status post CABG X4 in 2008  history of CVA, history of tobacco and alcohol use presented to the ED after a mechanical fall at home and hurt his neck. Also complained of pain over his right toe that he has noticed since 3 days.patient noted to have gangrene of his right 3rd toe with sepsis and DKA. admitted to stepdown.   Assessment/Plan: DKA (diabetic ketoacidoses)  In the setting of sepsis and severe non compliance Continue stepdown monitoring for today.  Started on insulin drip on admission.AG closed this am. Ordered 20 units levemir . Discontinue drip after 2 hrs and place on SSI. Diabetes coordinator consulted.contineu hydration with NS  Check A1C.   Severe sepsis  marked leucocytosis, elevated lactate secondary to gangrenous toe. X ray shows osteomyelitis.  Empirically covering with IV vanco and zosyn. Blood cx sent from ED  Appreciate ortho recommendations. Will need amputation of the toe once medically stable Afebrile this am, leucocytosis slightly improved. Elevated lactate. Possibly with sepsis and dehydration. will  monitor.     Active Problems:  H/O: CVA (cerebrovascular accident)  No residual weakness. informs taking aspirin   Hypertension  Not on any BP meds. Given extensive CAD will need perioperative BB. started on metoprolol   CAD S/P CABG x 4 in 2008  informs seeing Dr Terrence Dupont infrequently. Will consult him. Needs preop cardiac clearance as well.  Tobacco abuse/ Alcohol abuse  Reports to have quit   Anemia  Low MCV. Check stool for occult blood. iron panel suggests iron def.  DVT prophylaxis  Subcutaneous Lovenox   Diet: Diabetic   Code Status:full Family Communication:none Disposition  Plan: currently inpatient   Consultants:  Ortho ( Dr Berenice Primas)  Dr Terrence Dupont to be consulted  Procedures:  none  Antibiotics:  IV vnaco / zosyn: 5/22>>  HPI/Subjective: AG closed this am. Patient denies any N/V. Foot pain better.   Objective: Filed Vitals:   07/28/12 0400 07/28/12 0411 07/28/12 0500 07/28/12 0600  BP: 117/60 117/60 123/59   Pulse: 81 80 71   Temp:  98.1 F (36.7 C)    TempSrc:  Oral    Resp: 17 17 14    Height:      Weight:    82.2 kg (181 lb 3.5 oz)  SpO2: 98%  98%     Intake/Output Summary (Last 24 hours) at 07/28/12 0816 Last data filed at 07/28/12 0400  Gross per 24 hour  Intake   1555 ml  Output    300 ml  Net   1255 ml   Filed Weights   07/27/12 2100 07/27/12 2201 07/28/12 0600  Weight: 79.1 kg (174 lb 6.1 oz) 79.1 kg (174 lb 6.1 oz) 82.2 kg (181 lb 3.5 oz)    Exam:   General: middle aged male in NAD  HEENT: no pallor, dry mucosa  Cardiovascular: S1&S2 normal, 3/6 systolic murmur  Respiratory: clear b/l, no added sounds  Abdomen: soft, NT, ND, BS+ Musculoskeletal:Gangrenous right third toe with absent distal pulsation. Blister over her dorsum of the right foot. Trace edema over right foot extending to distal tibia  CNS: AAOX3   Data Reviewed: Basic Metabolic Panel:  Recent Labs Lab 07/27/12 1631 07/27/12 2106 07/27/12 2303 07/28/12 0058 07/28/12 0257  NA 126* 132*  134* 134* 135  K 4.4 5.0 3.6 3.6 3.7  CL 82* 93* 95* 96 97  CO2 14* 21 25 26 26   GLUCOSE 669* 370* 330* 264* 158*  BUN 42* 36* 36* 35* 35*  CREATININE 1.35 1.13 1.16 1.20 1.23  CALCIUM 9.6 9.4 9.1 9.2 9.3   Liver Function Tests:  Recent Labs Lab 07/27/12 1631  AST 21  ALT 14  ALKPHOS 149*  BILITOT 0.7  PROT 8.3  ALBUMIN 2.2*   No results found for this basename: LIPASE, AMYLASE,  in the last 168 hours No results found for this basename: AMMONIA,  in the last 168 hours CBC:  Recent Labs Lab 07/27/12 1631 07/27/12 2106 07/28/12 0257  WBC  29.1* 28.3* 24.8*  NEUTROABS 24.8*  --   --   HGB 8.4* 9.0* 8.2*  HCT 24.7* 25.7* 23.5*  MCV 78.7 76.7* 77.0*  PLT 376 359 367   Cardiac Enzymes: No results found for this basename: CKTOTAL, CKMB, CKMBINDEX, TROPONINI,  in the last 168 hours BNP (last 3 results) No results found for this basename: PROBNP,  in the last 8760 hours CBG:  Recent Labs Lab 07/28/12 0159 07/28/12 0328 07/28/12 0436 07/28/12 0531 07/28/12 0634  GLUCAP 204* 133* 133* 124* 125*    Recent Results (from the past 240 hour(s))  MRSA PCR SCREENING     Status: None   Collection Time    07/27/12  9:17 PM      Result Value Range Status   MRSA by PCR NEGATIVE  NEGATIVE Final   Comment:            The GeneXpert MRSA Assay (FDA     approved for NASAL specimens     only), is one component of a     comprehensive MRSA colonization     surveillance program. It is not     intended to diagnose MRSA     infection nor to guide or     monitor treatment for     MRSA infections.     Studies: Dg Chest 2 View  07/27/2012   *RADIOLOGY REPORT*  Clinical Data: Fever.  Left-sided chest pain.  Diabetes.  Coronary artery disease.  CHEST - 2 VIEW  Comparison: 07/20/2012  Findings: Low lung volumes and lordotic positioning noted.  Both lungs are clear.  No evidence of pleural effusion.  Heart size is within normal limits.  Prior CABG again noted.  IMPRESSION: No active disease.   Original Report Authenticated By: Earle Gell, M.D.   Ct Head Wo Contrast  07/27/2012   *RADIOLOGY REPORT*  Clinical Data:  Fall.  Head and neck injury and pain.  CT HEAD WITHOUT CONTRAST CT CERVICAL SPINE WITHOUT CONTRAST  Technique:  Multidetector CT imaging of the head and cervical spine was performed following the standard protocol without intravenous contrast.  Multiplanar CT image reconstructions of the cervical spine were also generated.  Comparison:  Head CT on 04/27/2006  CT HEAD  Findings: There is no evidence of intracranial hemorrhage, brain  edema or other signs of acute infarction.  There is no evidence of intracranial mass lesion or mass effect.  No abnormal extra-axial fluid collections are identified.  Ventricles remain normal in size.  Mild chronic small vessel disease is again seen.  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  No evidence of skull fracture or other bone lesion.  IMPRESSION:  1.  No acute intracranial findings. 2.  Chronic small vessel  disease, and old right basal ganglia and deep white matter lacunar infarct.  CT CERVICAL SPINE  Findings: No evidence of cervical spine fracture or subluxation. Degenerative disc disease is seen which is most pronounced at C6-7, and to lesser degrees at C4-5 and C5-6.  Mild facet DJD noted bilaterally as well as mild atlantoaxial degenerative changes.  No other significant bone abnormality identified.  IMPRESSION:  1.  No evidence of acute cervical spine fracture or subluxation. 2.  Degenerative spondylosis as described.   Original Report Authenticated By: Earle Gell, M.D.   Ct Cervical Spine Wo Contrast  07/27/2012   *RADIOLOGY REPORT*  Clinical Data:  Fall.  Head and neck injury and pain.  CT HEAD WITHOUT CONTRAST CT CERVICAL SPINE WITHOUT CONTRAST  Technique:  Multidetector CT imaging of the head and cervical spine was performed following the standard protocol without intravenous contrast.  Multiplanar CT image reconstructions of the cervical spine were also generated.  Comparison:  Head CT on 04/27/2006  CT HEAD  Findings: There is no evidence of intracranial hemorrhage, brain edema or other signs of acute infarction.  There is no evidence of intracranial mass lesion or mass effect.  No abnormal extra-axial fluid collections are identified.  Ventricles remain normal in size.  Mild chronic small vessel disease is again seen.  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  No evidence of  skull fracture or other bone lesion.  IMPRESSION:  1.  No acute intracranial findings. 2.  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  CT CERVICAL SPINE  Findings: No evidence of cervical spine fracture or subluxation. Degenerative disc disease is seen which is most pronounced at C6-7, and to lesser degrees at C4-5 and C5-6.  Mild facet DJD noted bilaterally as well as mild atlantoaxial degenerative changes.  No other significant bone abnormality identified.  IMPRESSION:  1.  No evidence of acute cervical spine fracture or subluxation. 2.  Degenerative spondylosis as described.   Original Report Authenticated By: Earle Gell, M.D.   Dg Foot Complete Right  07/27/2012   *RADIOLOGY REPORT*  Clinical Data: Gangrene right third toe block and with blistering extending into the lateral metatarsal and tarsal region, swelling, history diabetes, recent fall yesterday, question osteomyelitis  RIGHT FOOT COMPLETE - 3+ VIEW  Comparison: None  Findings: Soft tissue gas throughout the third toe. Additional foci of soft tissue gas are seen adjacent to the third metatarsal head and between the second and first metatarsal heads. Bones appear demineralized. Diffuse soft tissue swelling. Joint spaces preserved. Due to superimposed soft tissue gas is difficult to visualize the cortex at the distal phalanx there is question of bone destruction involving the shaft of the distal phalanx. No additional fracture dislocation or bone destruction. Small plantar calcaneal spur.  IMPRESSION: Extensive soft tissue gas in the third toe extending to the region of the third metatarsal head and between the first and second metatarsal head consistent with infection by a gas forming organism. Questionable bone destruction at the distal phalanx of the third toe raising question of osteomyelitis.   Original Report Authenticated By: Lavonia Dana, M.D.    Scheduled Meds: . sodium chloride    Intravenous STAT  . aspirin  81 mg Oral Daily  . enoxaparin (LOVENOX) injection  40 mg Subcutaneous Q24H  . insulin aspart  0-15 Units Subcutaneous TID WC  . insulin detemir  20 Units Subcutaneous Daily  .  metoprolol tartrate  25 mg Oral BID  . piperacillin-tazobactam (ZOSYN)  IV  3.375 g Intravenous Q8H  . sodium chloride  3 mL Intravenous Q12H  . vancomycin  1,000 mg Intravenous Q12H   Continuous Infusions: . sodium chloride 100 mL/hr at 07/28/12 0809  . dextrose 5 % and 0.45% NaCl 1,000 mL (07/28/12 0104)  . insulin (NOVOLIN-R) infusion 1.3 Units/hr (07/28/12 0807)      Time spent: 35 minutes    Lily Kernen, [place] Pager [phone] If 7PM-7AM, please contact night-coverage at [email or web address], password [facility] 07/28/2012, 8:16 AM  LOS: 1 day

## 2012-07-29 ENCOUNTER — Encounter (HOSPITAL_COMMUNITY): Payer: Self-pay | Admitting: Anesthesiology

## 2012-07-29 ENCOUNTER — Inpatient Hospital Stay (HOSPITAL_COMMUNITY): Payer: Medicare Other | Admitting: Anesthesiology

## 2012-07-29 ENCOUNTER — Encounter (HOSPITAL_COMMUNITY)
Admission: EM | Disposition: A | Discharge status: Skilled Nursing Facility | Payer: Self-pay | Source: Home / Self Care | Attending: Internal Medicine

## 2012-07-29 HISTORY — PX: AMPUTATION: SHX166

## 2012-07-29 LAB — CBC
HCT: 18.8 % — ABNORMAL LOW (ref 39.0–52.0)
Hemoglobin: 6.5 g/dL — CL (ref 13.0–17.0)
MCH: 26.6 pg (ref 26.0–34.0)
MCV: 77 fL — ABNORMAL LOW (ref 78.0–100.0)
Platelets: 371 10*3/uL (ref 150–400)
RBC: 2.44 MIL/uL — ABNORMAL LOW (ref 4.22–5.81)
WBC: 25.3 10*3/uL — ABNORMAL HIGH (ref 4.0–10.5)

## 2012-07-29 LAB — GLUCOSE, CAPILLARY
Glucose-Capillary: 237 mg/dL — ABNORMAL HIGH (ref 70–99)
Glucose-Capillary: 256 mg/dL — ABNORMAL HIGH (ref 70–99)

## 2012-07-29 LAB — HEMOGLOBIN A1C
Hgb A1c MFr Bld: 10.9 % — ABNORMAL HIGH (ref <5.7)
Mean Plasma Glucose: 266 mg/dL — ABNORMAL HIGH (ref <117)

## 2012-07-29 SURGERY — AMPUTATION DIGIT
Anesthesia: General | Laterality: Right | Wound class: Dirty or Infected

## 2012-07-29 MED ORDER — OXYCODONE HCL 5 MG/5ML PO SOLN: 5.0000 mg | Freq: Once | ORAL | Status: DC | PRN

## 2012-07-29 MED ORDER — FENTANYL CITRATE 0.05 MG/ML IJ SOLN
INTRAMUSCULAR | Status: DC | PRN
  Administered 2012-07-29: 50 ug via INTRAVENOUS

## 2012-07-29 MED ORDER — MORPHINE SULFATE 2 MG/ML IJ SOLN
2.0000 mg | INTRAMUSCULAR | Status: DC | PRN
  Administered 2012-08-05 – 2012-08-09 (×3): 2 mg via INTRAVENOUS
  Filled 2012-07-29 (×4): qty 1

## 2012-07-29 MED ORDER — 0.9 % SODIUM CHLORIDE (POUR BTL) OPTIME
TOPICAL | Status: DC | PRN
  Administered 2012-07-29: 1000 mL

## 2012-07-29 MED ORDER — PROPOFOL 10 MG/ML IV BOLUS
INTRAVENOUS | Status: DC | PRN
  Administered 2012-07-29: 150 mg via INTRAVENOUS

## 2012-07-29 MED ORDER — HYDRALAZINE HCL 25 MG PO TABS
25.0000 mg | ORAL_TABLET | Freq: Four times a day (QID) | ORAL | Status: DC | PRN
  Administered 2012-08-05 – 2012-08-07 (×3): 25 mg via ORAL
  Filled 2012-07-29 (×4): qty 1

## 2012-07-29 MED ORDER — MORPHINE SULFATE 2 MG/ML IJ SOLN: 1.0000 mg | INTRAMUSCULAR | Status: DC | PRN

## 2012-07-29 MED ORDER — OXYCODONE HCL 5 MG PO TABS: 5.0000 mg | ORAL_TABLET | Freq: Once | ORAL | Status: DC | PRN

## 2012-07-29 MED ORDER — HYDROGEN PEROXIDE 3 % EX SOLN
CUTANEOUS | Status: DC | PRN
  Administered 2012-07-29: 1

## 2012-07-29 MED ORDER — LIDOCAINE HCL (CARDIAC) 20 MG/ML IV SOLN
INTRAVENOUS | Status: DC | PRN
  Administered 2012-07-29: 80 mg via INTRAVENOUS

## 2012-07-29 MED ORDER — FAMOTIDINE IN NACL 20-0.9 MG/50ML-% IV SOLN
20.0000 mg | Freq: Once | INTRAVENOUS | Status: DC
  Filled 2012-07-29: qty 50

## 2012-07-29 MED ORDER — PROMETHAZINE HCL 25 MG/ML IJ SOLN: 6.2500 mg | INTRAMUSCULAR | Status: DC | PRN

## 2012-07-29 SURGICAL SUPPLY — 42 items
BANDAGE CONFORM 2  STR LF (GAUZE/BANDAGES/DRESSINGS) IMPLANT
BANDAGE ELASTIC 3 VELCRO ST LF (GAUZE/BANDAGES/DRESSINGS) IMPLANT
BANDAGE ELASTIC 4 VELCRO ST LF (GAUZE/BANDAGES/DRESSINGS) IMPLANT
BANDAGE GAUZE ELAST BULKY 4 IN (GAUZE/BANDAGES/DRESSINGS) ×2 IMPLANT
BNDG COHESIVE 1X5 TAN STRL LF (GAUZE/BANDAGES/DRESSINGS) IMPLANT
CLOTH BEACON ORANGE TIMEOUT ST (SAFETY) ×2 IMPLANT
CORDS BIPOLAR (ELECTRODE) IMPLANT
COVER SURGICAL LIGHT HANDLE (MISCELLANEOUS) ×2 IMPLANT
CUFF TOURNIQUET SINGLE 18IN (TOURNIQUET CUFF) ×2 IMPLANT
CUFF TOURNIQUET SINGLE 24IN (TOURNIQUET CUFF) IMPLANT
DRAPE OEC MINIVIEW 54X84 (DRAPES) IMPLANT
DRAPE SURG 17X23 STRL (DRAPES) ×2 IMPLANT
GAUZE SPONGE 2X2 8PLY STRL LF (GAUZE/BANDAGES/DRESSINGS) IMPLANT
GAUZE XEROFORM 1X8 LF (GAUZE/BANDAGES/DRESSINGS) IMPLANT
GLOVE BIO SURGEON STRL SZ7 (GLOVE) IMPLANT
GLOVE BIO SURGEON STRL SZ7.5 (GLOVE) ×2 IMPLANT
GLOVE BIOGEL PI IND STRL 8 (GLOVE) ×1 IMPLANT
GLOVE BIOGEL PI INDICATOR 8 (GLOVE) ×1
GOWN PREVENTION PLUS XLARGE (GOWN DISPOSABLE) ×2 IMPLANT
GOWN STRL NON-REIN LRG LVL3 (GOWN DISPOSABLE) ×4 IMPLANT
KIT BASIN OR (CUSTOM PROCEDURE TRAY) ×2 IMPLANT
KIT ROOM TURNOVER OR (KITS) ×2 IMPLANT
MANIFOLD NEPTUNE II (INSTRUMENTS) IMPLANT
NEEDLE HYPO 25GX1X1/2 BEV (NEEDLE) IMPLANT
NS IRRIG 1000ML POUR BTL (IV SOLUTION) ×2 IMPLANT
PACK ORTHO EXTREMITY (CUSTOM PROCEDURE TRAY) ×2 IMPLANT
PAD ARMBOARD 7.5X6 YLW CONV (MISCELLANEOUS) ×4 IMPLANT
PAD CAST 4YDX4 CTTN HI CHSV (CAST SUPPLIES) IMPLANT
PADDING CAST COTTON 4X4 STRL (CAST SUPPLIES)
SOLUTION BETADINE 4OZ (MISCELLANEOUS) IMPLANT
SPECIMEN JAR SMALL (MISCELLANEOUS) IMPLANT
SPONGE GAUZE 2X2 STER 10/PKG (GAUZE/BANDAGES/DRESSINGS)
SPONGE GAUZE 4X4 12PLY (GAUZE/BANDAGES/DRESSINGS) ×2 IMPLANT
SPONGE SCRUB IODOPHOR (GAUZE/BANDAGES/DRESSINGS) IMPLANT
SUCTION FRAZIER TIP 10 FR DISP (SUCTIONS) IMPLANT
SUT ETHILON 4 0 PS 2 18 (SUTURE) IMPLANT
SYR CONTROL 10ML LL (SYRINGE) IMPLANT
TOWEL OR 17X24 6PK STRL BLUE (TOWEL DISPOSABLE) ×2 IMPLANT
TOWEL OR 17X26 10 PK STRL BLUE (TOWEL DISPOSABLE) ×2 IMPLANT
TUBE CONNECTING 12X1/4 (SUCTIONS) IMPLANT
UNDERPAD 30X30 INCONTINENT (UNDERPADS AND DIAPERS) ×2 IMPLANT
WATER STERILE IRR 1000ML POUR (IV SOLUTION) IMPLANT

## 2012-07-29 NOTE — Progress Notes (Signed)
[facility] PROGRESS NOTE  [name] [id number] DOB: [birth year] DOA: 07/27/2012 PCP: No PCP Per Patient  Brief narrative:  62 year old male with history of diabetes mellitus ( was on oral hypoglycemics until one year back and stopped taking his meds), CAD status post CABG X4 in 2008 history of CVA, history of tobacco and alcohol use presented to the ED after a mechanical fall at home and hurt his neck. Also complained of pain over his right toe that he has noticed since 3 days.patient noted to have gangrene of his right 3rd toe with sepsis and DKA. admitted to stepdown.   Assessment/Plan:  DKA (diabetic ketoacidoses)  In the setting of sepsis and severe non compliance  Started on insulin drip on admission.AG closed.. Ordered 20 units levemir .Diabetes coordinator consulted.contineu hydration with NS  A1C of 10.9 .  Severe sepsis  marked leucocytosis, elevated lactate secondary to gangrenous toe. X ray shows osteomyelitis.  Empirically covering with IV vanco and zosyn. Blood cx so far no growth Appreciate ortho recommendations. Toe amputation today. MRI rt foot shows osteomyelitis  of rt third toe with cellulitis of foot. Follow post surgery.  Active Problems:  H/O: CVA (cerebrovascular accident)  No residual weakness. informs taking aspirin   Hypertension  Not on any BP meds. Given extensive CAD will need perioperative BB. started on metoprolol   CAD S/P CABG x 4 in 2008  informs seeing Dr Terrence Dupont infrequently. Discussed with him on 5/22. No further recommendations  Anemia Low MCV noted on iron panel. No recent blood work. Drop in H&H noted. No gross Gi bleed. Stool for occult blood pending. 2U PRBC ordered.   Tobacco abuse/ Alcohol abuse  Reports to have quit    DVT prophylaxis  Sq lovenox  Diet: Diabetic   Code Status:full  Family Communication:sisters at bedside  Disposition Plan: currently inpatient   Consultants:  Ortho ( Dr Berenice Primas)    Procedures:   Rt third toe MTP amputation on 5/24   Antibiotics:  IV vnaco / zosyn: 5/22>>     HPI/Subjective: Noted for drop in H&H. Ordered 2 U PRBC. Patient taken to OR this afternoon for toe amputation. Sisters at bedside  Objective: Filed Vitals:   07/29/12 1342 07/29/12 1345 07/29/12 1400 07/29/12 1415  BP: 155/61 169/83 172/76 184/97  Pulse:  81 89 93  Temp: 99.8 F (37.7 C)   99.3 F (37.4 C)  TempSrc:      Resp:  11 11 11   Height:      Weight:      SpO2: 98% 98% 97% 97%    Intake/Output Summary (Last 24 hours) at 07/29/12 1510 Last data filed at 07/29/12 1400  Gross per 24 hour  Intake 2887.5 ml  Output    755 ml  Net 2132.5 ml   Filed Weights   07/27/12 2201 07/28/12 0600 07/29/12 0036  Weight: 79.1 kg (174 lb 6.1 oz) 82.2 kg (181 lb 3.5 oz) 84 kg (185 lb 3 oz)    Exam: General: middle aged male in NAD  HEENT: no pallor, moist mucosa  Cardiovascular: S1&S2 normal, 3/6 systolic murmur  Respiratory: clear b/l, no added sounds  Abdomen: soft, NT, ND, BS+ Musculoskeletal:Gangrenous right third toe with absent distal pulsation. Blister over her dorsum of the right foot. Trace edema over right foot extending to distal tibia  CNS: AAOX3    Data Reviewed: Basic Metabolic Panel:  Recent Labs Lab 07/27/12 1631 07/27/12 2106 07/27/12 2303 07/28/12 0058 07/28/12 0257  NA 126* 132*  134* 134* 135  K 4.4 5.0 3.6 3.6 3.7  CL 82* 93* 95* 96 97  CO2 14* 21 25 26 26   GLUCOSE 669* 370* 330* 264* 158*  BUN 42* 36* 36* 35* 35*  CREATININE 1.35 1.13 1.16 1.20 1.23  CALCIUM 9.6 9.4 9.1 9.2 9.3   Liver Function Tests:  Recent Labs Lab 07/27/12 1631  AST 21  ALT 14  ALKPHOS 149*  BILITOT 0.7  PROT 8.3  ALBUMIN 2.2*   No results found for this basename: LIPASE, AMYLASE,  in the last 168 hours No results found for this basename: AMMONIA,  in the last 168 hours CBC:  Recent Labs Lab 07/27/12 1631 07/27/12 2106 07/28/12 0257 07/29/12 0849  WBC 29.1* 28.3*  24.8* 25.3*  NEUTROABS 24.8*  --   --   --   HGB 8.4* 9.0* 8.2* 6.5*  HCT 24.7* 25.7* 23.5* 18.8*  MCV 78.7 76.7* 77.0* 77.0*  PLT 376 359 367 371   Cardiac Enzymes: No results found for this basename: CKTOTAL, CKMB, CKMBINDEX, TROPONINI,  in the last 168 hours BNP (last 3 results) No results found for this basename: PROBNP,  in the last 8760 hours CBG:  Recent Labs Lab 07/28/12 1623 07/28/12 2156 07/29/12 0817 07/29/12 1217 07/29/12 1351  GLUCAP 319* 243* 235* 237* 256*    Recent Results (from the past 240 hour(s))  CULTURE, BLOOD (ROUTINE X 2)     Status: None   Collection Time    07/27/12  4:40 PM      Result Value Range Status   Specimen Description BLOOD LEFT ANTECUBITAL   Final   Special Requests BOTTLES DRAWN AEROBIC ONLY 4CC   Final   Culture  Setup Time 07/27/2012 21:39   Final   Culture     Final   Value:        BLOOD CULTURE RECEIVED NO GROWTH TO DATE CULTURE WILL BE HELD FOR 5 DAYS BEFORE ISSUING A FINAL NEGATIVE REPORT   Report Status PENDING   Incomplete  CULTURE, BLOOD (ROUTINE X 2)     Status: None   Collection Time    07/27/12  4:50 PM      Result Value Range Status   Specimen Description BLOOD RIGHT HAND   Final   Special Requests BOTTLES DRAWN AEROBIC ONLY 1CC   Final   Culture  Setup Time 07/27/2012 21:38   Final   Culture     Final   Value:        BLOOD CULTURE RECEIVED NO GROWTH TO DATE CULTURE WILL BE HELD FOR 5 DAYS BEFORE ISSUING A FINAL NEGATIVE REPORT   Report Status PENDING   Incomplete  URINE CULTURE     Status: None   Collection Time    07/27/12  5:10 PM      Result Value Range Status   Specimen Description URINE, CLEAN CATCH   Final   Special Requests NONE   Final   Culture  Setup Time 07/27/2012 21:57   Final   Colony Count NO GROWTH   Final   Culture NO GROWTH   Final   Report Status 07/28/2012 FINAL   Final  MRSA PCR SCREENING     Status: None   Collection Time    07/27/12  9:17 PM      Result Value Range Status   MRSA by PCR  NEGATIVE  NEGATIVE Final   Comment:            The GeneXpert MRSA Assay (FDA  approved for NASAL specimens     only), is one component of a     comprehensive MRSA colonization     surveillance program. It is not     intended to diagnose MRSA     infection nor to guide or     monitor treatment for     MRSA infections.     Studies: Dg Chest 2 View  07/27/2012   *RADIOLOGY REPORT*  Clinical Data: Fever.  Left-sided chest pain.  Diabetes.  Coronary artery disease.  CHEST - 2 VIEW  Comparison: 07/20/2012  Findings: Low lung volumes and lordotic positioning noted.  Both lungs are clear.  No evidence of pleural effusion.  Heart size is within normal limits.  Prior CABG again noted.  IMPRESSION: No active disease.   Original Report Authenticated By: Earle Gell, M.D.   Ct Head Wo Contrast  07/27/2012   *RADIOLOGY REPORT*  Clinical Data:  Fall.  Head and neck injury and pain.  CT HEAD WITHOUT CONTRAST CT CERVICAL SPINE WITHOUT CONTRAST  Technique:  Multidetector CT imaging of the head and cervical spine was performed following the standard protocol without intravenous contrast.  Multiplanar CT image reconstructions of the cervical spine were also generated.  Comparison:  Head CT on 04/27/2006  CT HEAD  Findings: There is no evidence of intracranial hemorrhage, brain edema or other signs of acute infarction.  There is no evidence of intracranial mass lesion or mass effect.  No abnormal extra-axial fluid collections are identified.  Ventricles remain normal in size.  Mild chronic small vessel disease is again seen.  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  No evidence of skull fracture or other bone lesion.  IMPRESSION:  1.  No acute intracranial findings. 2.  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  CT CERVICAL SPINE  Findings: No evidence of cervical spine fracture or subluxation.  Degenerative disc disease is seen which is most pronounced at C6-7, and to lesser degrees at C4-5 and C5-6.  Mild facet DJD noted bilaterally as well as mild atlantoaxial degenerative changes.  No other significant bone abnormality identified.  IMPRESSION:  1.  No evidence of acute cervical spine fracture or subluxation. 2.  Degenerative spondylosis as described.   Original Report Authenticated By: Earle Gell, M.D.   Ct Cervical Spine Wo Contrast  07/27/2012   *RADIOLOGY REPORT*  Clinical Data:  Fall.  Head and neck injury and pain.  CT HEAD WITHOUT CONTRAST CT CERVICAL SPINE WITHOUT CONTRAST  Technique:  Multidetector CT imaging of the head and cervical spine was performed following the standard protocol without intravenous contrast.  Multiplanar CT image reconstructions of the cervical spine were also generated.  Comparison:  Head CT on 04/27/2006  CT HEAD  Findings: There is no evidence of intracranial hemorrhage, brain edema or other signs of acute infarction.  There is no evidence of intracranial mass lesion or mass effect.  No abnormal extra-axial fluid collections are identified.  Ventricles remain normal in size.  Mild chronic small vessel disease is again seen.  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  No evidence of skull fracture or other bone lesion.  IMPRESSION:  1.  No acute intracranial findings. 2.  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  CT CERVICAL SPINE  Findings: No evidence of cervical spine  fracture or subluxation. Degenerative disc disease is seen which is most pronounced at C6-7, and to lesser degrees at C4-5 and C5-6.  Mild facet DJD noted bilaterally as well as mild atlantoaxial degenerative changes.  No other significant bone abnormality identified.  IMPRESSION:  1.  No evidence of acute cervical spine fracture or subluxation. 2.  Degenerative spondylosis as described.    Original Report Authenticated By: Earle Gell, M.D.   Mr Foot Right Wo Contrast  07/28/2012   *RADIOLOGY REPORT*  Clinical Data: Diabetes.  Gas in third toe.  Abscess.  MRI OF THE RIGHT FOREFOOT WITHOUT CONTRAST  Technique:  Multiplanar, multisequence MR imaging was performed. No intravenous contrast was administered.  Comparison: None.  Findings: 9.8 x 4.6 cm skin blistering noted along the dorsum of the foot.  Diffuse subcutaneous edema in the foot with diffuse edema within along the plantar musculature of the foot  Low signal intensity compatible with infiltrative gas in the subcutaneous tissues noted in the third toe, most appreciable on the T1-weighted images. Small linear gas signal intensities in the first and second intermetatarsal regions distally. Abnormal signal distal phalanx third toe compatible with osteomyelitis of the distal phalanx.  No other regions of osteomyelitis are observed in the forefoot.  Lisfranc ligament intact.  Subtle edema in the distal talus.  IMPRESSION:  1.  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 Trace amount of suspected gas in the distal first and second intermetatarsal spaces. 2.  Diffuse soft tissue infiltrative edema in the foot, involving its subcutaneous and muscular structures.  Appearance suggests cellulitis and myositis. 3.  Large blister along the dorsum of the foot. 4.  Subtle edema in the distal talus, likely reactive or due to mild stress reaction.   Original Report Authenticated By: Van Clines, M.D.   Dg Foot Complete Right  07/27/2012   *RADIOLOGY REPORT*  Clinical Data: Gangrene right third toe block and with blistering extending into the lateral metatarsal and tarsal region, swelling, history diabetes, recent fall yesterday, question osteomyelitis  RIGHT FOOT COMPLETE - 3+ VIEW  Comparison: None   Findings: Soft tissue gas throughout the third toe. Additional foci of soft tissue gas are seen adjacent to the third metatarsal head and between the second and first metatarsal heads. Bones appear demineralized. Diffuse soft tissue swelling. Joint spaces preserved. Due to superimposed soft tissue gas is difficult to visualize the cortex at the distal phalanx there is question of bone destruction involving the shaft of the distal phalanx. No additional fracture dislocation or bone destruction. Small plantar calcaneal spur.  IMPRESSION: Extensive soft tissue gas in the third toe extending to the region of the third metatarsal head and between the first and second metatarsal head consistent with infection by a gas forming organism. Questionable bone destruction at the distal phalanx of the third toe raising question of osteomyelitis.   Original Report Authenticated By: Lavonia Dana, M.D.    Scheduled Meds: . aspirin  81 mg Oral Daily  . enoxaparin (LOVENOX) injection  40 mg Subcutaneous Q24H  . famotidine (PEPCID) IV  20 mg Intravenous Once  . insulin aspart  0-15 Units Subcutaneous TID WC  . insulin aspart  0-5 Units Subcutaneous QHS  . insulin detemir  20 Units Subcutaneous Daily  . metoprolol tartrate  25 mg Oral BID  . piperacillin-tazobactam (ZOSYN)  IV  3.375 g Intravenous  Q8H  . sodium chloride  3 mL Intravenous Q12H  . vancomycin  1,000 mg Intravenous Q12H   Continuous Infusions: . sodium chloride 1,000 mL (07/28/12 2039)  . dextrose 5 % and 0.45% NaCl 1,000 mL (07/28/12 0104)       Time spent:35 minutes   Freman Lapage, [place]  [facility] Pager [phone] If 7PM-7AM, please contact night-coverage at [email or web address], password [facility] 07/29/2012, 3:10 PM  LOS: 2 days

## 2012-07-29 NOTE — Anesthesia Preprocedure Evaluation (Addendum)
Anesthesia Evaluation  Patient identified by MRN, date of birth, ID band Patient awake    Reviewed: Allergy & Precautions, H&P , NPO status , Patient's Chart, lab work & pertinent test results  Airway Mallampati: II  Neck ROM: Full    Dental   Pulmonary neg pulmonary ROS, Current Smoker,    + decreased breath sounds      Cardiovascular hypertension, + CAD, + CABG and + Peripheral Vascular Disease Rhythm:Regular Rate:Normal     Neuro/Psych CVA    GI/Hepatic   Endo/Other  diabetes  Renal/GU Renal InsufficiencyRenal disease     Musculoskeletal   Abdominal   Peds  Hematology   Anesthesia Other Findings   Reproductive/Obstetrics                           Anesthesia Physical Anesthesia Plan  ASA: III  Anesthesia Plan: General   Post-op Pain Management:    Induction: Intravenous  Airway Management Planned: LMA  Additional Equipment:   Intra-op Plan:   Post-operative Plan: Extubation in OR  Informed Consent: I have reviewed the patients History and Physical, chart, labs and discussed the procedure including the risks, benefits and alternatives for the proposed anesthesia with the patient or authorized representative who has indicated his/her understanding and acceptance.   Dental advisory given  Plan Discussed with: CRNA and Surgeon  Anesthesia Plan Comments:         Anesthesia Quick Evaluation

## 2012-07-29 NOTE — Anesthesia Postprocedure Evaluation (Signed)
  Anesthesia Post-op Note  Patient: [name]  Procedure(s) Performed: Procedure(s) with comments: AMPUTATION 3RD TOE (Right) - right third toe amputation  Patient Location: PACU  Anesthesia Type:General  Level of Consciousness: awake  Airway and Oxygen Therapy: Patient Spontanous Breathing  Post-op Pain: none  Post-op Assessment: Post-op Vital signs reviewed  Post-op Vital Signs: stable  Complications: No apparent anesthesia complications

## 2012-07-29 NOTE — Op Note (Addendum)
Pre Op Dx: Gangrene and necrosis of right third toe secondary to diabetic ketoacidosis with deep infection  Post Op Dx: Same  Procedure: Right third toe MTP amputation with irrigation and debridement of soft tissues  Surgeon: Kerin Salen, MD  Assistant: Leafy Kindle PA-C, present for entire case and needed for retraction irrigation and debridement the  Anesthesia: General  EBL: None  Fluids: 800 cc  Tourniquet Time: None  Indications: Patient was admitted 2 days ago with a blood sugar of 600 and sepsis. Had blistering to the dorsal aspect of the right foot and obvious necrosis of the third toe. Plain x-rays were consistent with possible osteo-of the third toe distal phalanx confirmed by MRI scan. Clinically the entire third toe was necrotic. The patient is taken for probable MTP amputation and debridement of any nonviable tissue. He is a long-term diabetic and has not taken his medicines for the last year resulting in the diabetic ketoacidosis. He is on IV vancomycin and Zosyn  Procedure: Patient was identified by arm band and taken to operating room 3 at [facility]. The appropriate anesthetic monitors were attached and general LMA anesthesia induced with the patient in supine position. The right lower extremity was prepped and draped in the usual sterile fashion, and time out procedure performed. Using a #15 blade and cutting back to normal-appearing tissue at the base of the third toe we then amputated at the level of the MTP joint. There was virtually no bleeding noted although the remaining tissue appeared healthy we also noted blistering from the second toe lateral side and the fourth toe medial side. The footpad over the metatarsal heads from 2-4 had a dusky appearance, but had not necrosed.After sharp debridement all tissues were thoroughly irrigated with normal saline solution alternating with hydrogen peroxide. We then applied a dressing of 4 x 4's and Kerlix. The patient  was awakened extubated and taken to the recovery room without difficulty.

## 2012-07-29 NOTE — Anesthesia Procedure Notes (Signed)
Procedure Name: LMA Insertion Date/Time: 07/29/2012 1:06 PM Performed by: Marinda Elk A Pre-anesthesia Checklist: Patient identified, Timeout performed, Emergency Drugs available, Suction available and Patient being monitored Patient Re-evaluated:Patient Re-evaluated prior to inductionOxygen Delivery Method: Circle system utilized Preoxygenation: Pre-oxygenation with 100% oxygen Intubation Type: IV induction Ventilation: Mask ventilation without difficulty LMA Size: 5.0 Number of attempts: 1 Placement Confirmation: positive ETCO2 Tube secured with: Tape Dental Injury: Teeth and Oropharynx as per pre-operative assessment

## 2012-07-29 NOTE — Progress Notes (Addendum)
Patient ID: [name], male   DOB: 1950-07-26, 62 y.o.   MRN: [id number] Subjective: Patient is much improved today regarding his diabetic ketoacidosis he is alert oriented and anxious to discuss definitive treatment of the right third toe. His white blood cell count has dropped down to 24,000 and his temperature dropped down to 100. Because of diabetic neuropathy he actually has little pain in the foot. The third toe is obviously black and necrotic from gangrene. His MRI scan is consistent with osteomyelitis of the third toe distal phalanx but on clinical examination he has obvious necrosis of the skin back to the metatarsal head. The risks and benefits of amputation were discussed at length with the patient.  Objective: The right third toe is obviously necrotic all way back to the base. The blister to the dorsum of the foot is healing nicely and the swelling is greatly diminished to the foot. He does have some sensation intact to the dorsum and plantar aspect of the foot and the other toes appear to be in good shape. The MRI scan did show the osteomyelitis of the third toe as well as some gas in the soft tissues around the third metatarsal head.  Assessment: Obviously necrotic right third toe secondary to gangrene secondary to diabetic ketoacidosis, ketoacidosis now resolved.  Plan: the risks and benefits of the indication discussed at length with the patient we'll try to get this accomplished later today after 1300 hours. We'll obtain informed consent and place the patient n.p.o.

## 2012-07-29 NOTE — Progress Notes (Signed)
CRITICAL VALUE ALERT  Critical value received:  Hemoglobin 6.5  Date of notification:  07/29/12  Time of notification:  0915  Critical value read back:YES  Nurse who received alert:  HaskinsRN  MD notified (1st page):  Dhungel  Time of first page:  0916  MD notified (2nd page):  Time of second page:  Responding MD:  Clementeen Graham  Time MD responded:  [phone]

## 2012-07-29 NOTE — Transfer of Care (Signed)
Immediate Anesthesia Transfer of Care Note  Patient: [name]  Procedure(s) Performed: Procedure(s) with comments: AMPUTATION 3RD TOE (Right) - right third toe amputation  Patient Location: PACU  Anesthesia Type:General  Level of Consciousness: awake  Airway & Oxygen Therapy: Patient Spontanous Breathing  Post-op Assessment: Report given to PACU RN and Post -op Vital signs reviewed and stable  Post vital signs: Reviewed and stable  Complications: No apparent anesthesia complications

## 2012-07-30 DIAGNOSIS — E1310 Other specified diabetes mellitus with ketoacidosis without coma: Secondary | ICD-10-CM

## 2012-07-30 DIAGNOSIS — E1110 Type 2 diabetes mellitus with ketoacidosis without coma: Secondary | ICD-10-CM | POA: Diagnosis present

## 2012-07-30 LAB — CBC
HCT: 29.8 % — ABNORMAL LOW (ref 39.0–52.0)
MCH: 27.6 pg (ref 26.0–34.0)
MCHC: 35.9 g/dL (ref 30.0–36.0)
MCV: 77 fL — ABNORMAL LOW (ref 78.0–100.0)
Platelets: 339 10*3/uL (ref 150–400)
RDW: 13.8 % (ref 11.5–15.5)
WBC: 21.3 10*3/uL — ABNORMAL HIGH (ref 4.0–10.5)

## 2012-07-30 LAB — TYPE AND SCREEN
ABO/RH(D): B POS
Antibody Screen: NEGATIVE
Unit division: 0

## 2012-07-30 LAB — GLUCOSE, CAPILLARY
Glucose-Capillary: 177 mg/dL — ABNORMAL HIGH (ref 70–99)
Glucose-Capillary: 149 mg/dL — ABNORMAL HIGH (ref 70–99)

## 2012-07-30 MED ORDER — INSULIN DETEMIR 100 UNIT/ML ~~LOC~~ SOLN
25.0000 [IU] | SUBCUTANEOUS | Status: DC
  Administered 2012-07-30 – 2012-08-08 (×9): 25 [IU] via SUBCUTANEOUS
  Filled 2012-07-30 (×18): qty 0.25

## 2012-07-30 MED ORDER — INSULIN ASPART 100 UNIT/ML ~~LOC~~ SOLN
3.0000 [IU] | Freq: Three times a day (TID) | SUBCUTANEOUS | Status: DC
  Administered 2012-07-30 – 2012-08-08 (×17): 3 [IU] via SUBCUTANEOUS

## 2012-07-30 MED ORDER — POLYETHYLENE GLYCOL 3350 17 G PO PACK
17.0000 g | PACK | Freq: Every day | ORAL | Status: DC
  Administered 2012-07-30 – 2012-08-08 (×8): 17 g via ORAL
  Filled 2012-07-30 (×13): qty 1

## 2012-07-30 NOTE — Progress Notes (Signed)
[facility] PROGRESS NOTE  [name] [id number] DOB: 1950-05-12 DOA: 07/27/2012 PCP: No PCP Per Patient  Brief narrative:  62 year old male with history of diabetes mellitus ( was on oral hypoglycemics until one year back and stopped taking his meds), CAD status post CABG X4 in 2008 history of CVA, history of tobacco and alcohol use presented to the ED after a mechanical fall at home and hurt his neck. Also complained of pain over his right toe that he has noticed since 3 days.patient noted to have gangrene of his right 3rd toe with sepsis and DKA. admitted to stepdown.    Assessment/Plan:  DKA (diabetic ketoacidoses)  In the setting of sepsis and severe non compliance  Started on insulin drip on admission.AG closed and placed on levemir. A1C of 10.9.  Increase levemir dose and add premeal aspart .  Severe sepsis  marked leucocytosis, elevated lactate secondary to gangrenous toe. X ray shows osteomyelitis. MRI rt foot shows osteomyelitis of rt third toe with cellulitis of foot.  Empirically covering with IV vanco and zosyn. Blood cx so far no growth  Appreciate ortho recommendations. Toe amputation done on 5/24. tolerated well. continue dressing. Pain control  Active Problems:  H/O: CVA (cerebrovascular accident)  No residual weakness. continue ASA   Hypertension  Not on any BP meds. On perioperative BB.  CAD S/P CABG x 4 in 2008  informs seeing Dr Terrence Dupont infrequently. Discussed with him on 5/22. No further recommendations   Anemia  Low MCV noted on iron panel. No recent blood work. Drop in H&H noted. No gross Gi bleed. Stool for occult blood pending. 2U PRBC given with good improvement in H&H. Monitor closely.  Tobacco abuse/ Alcohol abuse  Reports to have quit   DVT prophylaxis  Sq lovenox   Diet: Diabetic   Code Status:full   Family Communication:sisters at bedside   Disposition Plan: currently inpatient   Consultants:  Ortho ( Dr Berenice Primas)     Procedures:  Rt third toe MTP amputation on 5/24  Antibiotics:  IV vnaco / zosyn: 5/22>>   HPI/Subjective:  S/p rt toe amputation. tolerated well  Objective: Filed Vitals:   07/30/12 0900 07/30/12 1100 07/30/12 1146 07/30/12 1335  BP:   152/70 130/72  Pulse: 87 88 79 71  Temp:   99.3 F (37.4 C) 98.9 F (37.2 C)  TempSrc:   Oral Oral  Resp: 9 14 16 18   Height:      Weight:      SpO2: 96% 97% 99% 99%    Intake/Output Summary (Last 24 hours) at 07/30/12 1343 Last data filed at 07/30/12 1100  Gross per 24 hour  Intake 3925.67 ml  Output   1375 ml  Net 2550.67 ml   Filed Weights   07/27/12 2201 07/28/12 0600 07/29/12 0036  Weight: 79.1 kg (174 lb 6.1 oz) 82.2 kg (181 lb 3.5 oz) 84 kg (185 lb 3 oz)    Exam:  General: middle aged male in NAD  HEENT: no pallor, moist mucosa  Cardiovascular: S1&S2 normal, 3/6 systolic murmur  Respiratory: clear b/l, no added sounds  Abdomen: soft, NT, ND, BS+ Musculoskeletal:right foot dressing.   Trace edema over right foot extending to distal tibia  CNS: AAOX3    Data Reviewed: Basic Metabolic Panel:  Recent Labs Lab 07/27/12 1631 07/27/12 2106 07/27/12 2303 07/28/12 0058 07/28/12 0257  NA 126* 132* 134* 134* 135  K 4.4 5.0 3.6 3.6 3.7  CL 82* 93* 95* 96 97  CO2 14*  21 25 26 26   GLUCOSE 669* 370* 330* 264* 158*  BUN 42* 36* 36* 35* 35*  CREATININE 1.35 1.13 1.16 1.20 1.23  CALCIUM 9.6 9.4 9.1 9.2 9.3   Liver Function Tests:  Recent Labs Lab 07/27/12 1631  AST 21  ALT 14  ALKPHOS 149*  BILITOT 0.7  PROT 8.3  ALBUMIN 2.2*   No results found for this basename: LIPASE, AMYLASE,  in the last 168 hours No results found for this basename: AMMONIA,  in the last 168 hours CBC:  Recent Labs Lab 07/27/12 1631 07/27/12 2106 07/28/12 0257 07/29/12 0849 07/30/12 0440  WBC 29.1* 28.3* 24.8* 25.3* 21.3*  NEUTROABS 24.8*  --   --   --   --   HGB 8.4* 9.0* 8.2* 6.5* 10.7*  HCT 24.7* 25.7* 23.5* 18.8* 29.8*   MCV 78.7 76.7* 77.0* 77.0* 77.0*  PLT 376 359 367 371 339   Cardiac Enzymes: No results found for this basename: CKTOTAL, CKMB, CKMBINDEX, TROPONINI,  in the last 168 hours BNP (last 3 results) No results found for this basename: PROBNP,  in the last 8760 hours CBG:  Recent Labs Lab 07/29/12 1351 07/29/12 1713 07/29/12 2110 07/30/12 0819 07/30/12 1150  GLUCAP 256* 274* 243* 270* 254*    Recent Results (from the past 240 hour(s))  CULTURE, BLOOD (ROUTINE X 2)     Status: None   Collection Time    07/27/12  4:40 PM      Result Value Range Status   Specimen Description BLOOD LEFT ANTECUBITAL   Final   Special Requests BOTTLES DRAWN AEROBIC ONLY 4CC   Final   Culture  Setup Time 07/27/2012 21:39   Final   Culture     Final   Value:        BLOOD CULTURE RECEIVED NO GROWTH TO DATE CULTURE WILL BE HELD FOR 5 DAYS BEFORE ISSUING A FINAL NEGATIVE REPORT   Report Status PENDING   Incomplete  CULTURE, BLOOD (ROUTINE X 2)     Status: None   Collection Time    07/27/12  4:50 PM      Result Value Range Status   Specimen Description BLOOD RIGHT HAND   Final   Special Requests BOTTLES DRAWN AEROBIC ONLY 1CC   Final   Culture  Setup Time 07/27/2012 21:38   Final   Culture     Final   Value:        BLOOD CULTURE RECEIVED NO GROWTH TO DATE CULTURE WILL BE HELD FOR 5 DAYS BEFORE ISSUING A FINAL NEGATIVE REPORT   Report Status PENDING   Incomplete  URINE CULTURE     Status: None   Collection Time    07/27/12  5:10 PM      Result Value Range Status   Specimen Description URINE, CLEAN CATCH   Final   Special Requests NONE   Final   Culture  Setup Time 07/27/2012 21:57   Final   Colony Count NO GROWTH   Final   Culture NO GROWTH   Final   Report Status 07/28/2012 FINAL   Final  MRSA PCR SCREENING     Status: None   Collection Time    07/27/12  9:17 PM      Result Value Range Status   MRSA by PCR NEGATIVE  NEGATIVE Final   Comment:            The GeneXpert MRSA Assay (FDA      approved for NASAL specimens  only), is one component of a     comprehensive MRSA colonization     surveillance program. It is not     intended to diagnose MRSA     infection nor to guide or     monitor treatment for     MRSA infections.     Studies: No results found.  Scheduled Meds: . aspirin  81 mg Oral Daily  . enoxaparin (LOVENOX) injection  40 mg Subcutaneous Q24H  . famotidine (PEPCID) IV  20 mg Intravenous Once  . insulin aspart  0-15 Units Subcutaneous TID WC  . insulin aspart  3 Units Subcutaneous TID WC  . insulin detemir  25 Units Subcutaneous Q24H  . metoprolol tartrate  25 mg Oral BID  . piperacillin-tazobactam (ZOSYN)  IV  3.375 g Intravenous Q8H  . sodium chloride  3 mL Intravenous Q12H  . vancomycin  1,000 mg Intravenous Q12H   Continuous Infusions: . sodium chloride 100 mL/hr (07/30/12 0410)      Time spent: 35 minutes    [name], [facility] Pager [phone] If 7PM-7AM, please contact night-coverage at [email or web address], password [facility] 07/30/2012, 1:43 PM  LOS: 3 days

## 2012-07-30 NOTE — Progress Notes (Signed)
S:  Patient is awake, alert, and oriented this morning.  He reports improvement in his foot pain.  No nausea or vomiting.  Passing flatus.  O: Right foot dressing has some drainage over the lateral dorsum of the foot.    A: #1 s/p right third toe amputation yesterday #2 Diabetic ketoacidosis - managed by medicine service.  P: Dressing changes twice daily.  Wash with peroxide and then apply 4x4 gauze and kerlix.  Partial weightbearing right foot.

## 2012-07-30 NOTE — Progress Notes (Signed)
ANTIBIOTIC CONSULT NOTE - INITIAL  Pharmacy Consult for Vancomycin Indication: gangrene in rt third toe  No Known Allergies  Patient Measurements: Height: 5\' 11"  (180.3 cm) Weight: 185 lb 3 oz (84 kg) IBW/kg (Calculated) : 75.3   Vital Signs: Temp: 100.1 F (37.8 C) (05/25 0800) Temp src: Oral (05/25 0800) BP: 142/70 mmHg (05/25 0800) Pulse Rate: 87 (05/25 0900) Intake/Output from previous day: 05/24 0701 - 05/25 0700 In: 4195.7 [P.O.:360; I.V.:2610.7; Blood:762.5; IV Piggyback:462.5] Out: 880 [Urine:875; Blood:5] Intake/Output from this shift: Total I/O In: 880 [P.O.:480; I.V.:200; IV Piggyback:200] Out: 500 [Urine:500]  Labs:  Recent Labs  07/27/12 2303 07/28/12 0058 07/28/12 0257 07/29/12 0849 07/30/12 0440  WBC  --   --  24.8* 25.3* 21.3*  HGB  --   --  8.2* 6.5* 10.7*  PLT  --   --  367 371 339  CREATININE 1.16 1.20 1.23  --   --    Estimated Creatinine Clearance: 66.3 ml/min (by C-G formula based on Cr of 1.23).  Recent Labs  07/30/12 0840  [place] 16.9     Microbiology: Recent Results (from the past 720 hour(s))  CULTURE, BLOOD (ROUTINE X 2)     Status: None   Collection Time    07/27/12  4:40 PM      Result Value Range Status   Specimen Description BLOOD LEFT ANTECUBITAL   Final   Special Requests BOTTLES DRAWN AEROBIC ONLY 4CC   Final   Culture  Setup Time 07/27/2012 21:39   Final   Culture     Final   Value:        BLOOD CULTURE RECEIVED NO GROWTH TO DATE CULTURE WILL BE HELD FOR 5 DAYS BEFORE ISSUING A FINAL NEGATIVE REPORT   Report Status PENDING   Incomplete  CULTURE, BLOOD (ROUTINE X 2)     Status: None   Collection Time    07/27/12  4:50 PM      Result Value Range Status   Specimen Description BLOOD RIGHT HAND   Final   Special Requests BOTTLES DRAWN AEROBIC ONLY 1CC   Final   Culture  Setup Time 07/27/2012 21:38   Final   Culture     Final   Value:        BLOOD CULTURE RECEIVED NO GROWTH TO DATE CULTURE WILL BE HELD FOR 5 DAYS  BEFORE ISSUING A FINAL NEGATIVE REPORT   Report Status PENDING   Incomplete  URINE CULTURE     Status: None   Collection Time    07/27/12  5:10 PM      Result Value Range Status   Specimen Description URINE, CLEAN CATCH   Final   Special Requests NONE   Final   Culture  Setup Time 07/27/2012 21:57   Final   Colony Count NO GROWTH   Final   Culture NO GROWTH   Final   Report Status 07/28/2012 FINAL   Final  MRSA PCR SCREENING     Status: None   Collection Time    07/27/12  9:17 PM      Result Value Range Status   MRSA by PCR NEGATIVE  NEGATIVE Final   Comment:            The GeneXpert MRSA Assay (FDA     approved for NASAL specimens     only), is one component of a     comprehensive MRSA colonization     surveillance program. It is not     intended  to diagnose MRSA     infection nor to guide or     monitor treatment for     MRSA infections.   Assessment: 61 yr old male arrived at ED after a fall at home. 3rd right toe was painful and discolored. He had stopped his DM meds a year ago (metformin and glipizide).  Blood glucose was >600. WBC 29K. Was started on an insulin drip, zosyn and vancomycin per pharmacy.    5/24:  Right third toe MTP amputation with irrigation and debridement of soft tissues  5/25 - Vancomycin trough 16.9 on 1g IV q12h which is therapeutic  Goal of Therapy:  Vancomycin trough level 15-20 mcg/ml  Plan:  Continue Vancomycin 1 Gm IV q12hrs.  Continue Zosyn 3.375g IV q8h (infuse over 4 hours)  Candie Mile 07/30/2012,11:39 AM

## 2012-07-31 ENCOUNTER — Inpatient Hospital Stay (HOSPITAL_COMMUNITY): Payer: Medicare Other

## 2012-07-31 DIAGNOSIS — I10 Essential (primary) hypertension: Secondary | ICD-10-CM

## 2012-07-31 LAB — URINALYSIS, ROUTINE W REFLEX MICROSCOPIC
Glucose, UA: 250 mg/dL — AB
Ketones, ur: NEGATIVE mg/dL
pH: 5.5 (ref 5.0–8.0)

## 2012-07-31 LAB — GLUCOSE, CAPILLARY
Glucose-Capillary: 215 mg/dL — ABNORMAL HIGH (ref 70–99)
Glucose-Capillary: 248 mg/dL — ABNORMAL HIGH (ref 70–99)

## 2012-07-31 LAB — CBC
MCH: 27.2 pg (ref 26.0–34.0)
MCHC: 35.4 g/dL (ref 30.0–36.0)
Platelets: 332 10*3/uL (ref 150–400)
RBC: 3.35 MIL/uL — ABNORMAL LOW (ref 4.22–5.81)
RDW: 14.3 % (ref 11.5–15.5)

## 2012-07-31 LAB — URINE MICROSCOPIC-ADD ON

## 2012-07-31 MED ORDER — FAMOTIDINE 10 MG PO TABS
10.0000 mg | ORAL_TABLET | Freq: Two times a day (BID) | ORAL | Status: DC
  Administered 2012-07-31 – 2012-08-09 (×19): 10 mg via ORAL
  Filled 2012-07-31 (×21): qty 1

## 2012-07-31 MED ORDER — ALUM & MAG HYDROXIDE-SIMETH 200-200-20 MG/5ML PO SUSP: 30.0000 mL | Freq: Four times a day (QID) | ORAL | Status: DC | PRN

## 2012-07-31 NOTE — Progress Notes (Signed)
Orthopedic Tech Progress Note Patient Details:  [name] [birth year] [id number]  Ortho Devices Type of Ortho Device: Darco shoe Ortho Device/Splint Interventions: Application   [name] 07/31/2012, 4:50 PM

## 2012-07-31 NOTE — Progress Notes (Addendum)
Physical Therapy Treatment Patient Details Name: [name] MRN: [id number] DOB: June 27, 1950 Today's Date: 07/31/2012 Time: [id number] PT Time Calculation (min): 33 min  PT Assessment / Plan / Recommendation Comments on Treatment Session  Pt re-evaluated s/p R 3rd toe amputation. Patient demonstrates deficitts in functional mobility seconodary to pain and WBing restrictions. At this time patient may benefit from use of Darko shoe to assist with wt bearing. Will continue to see patient to increase activity as tolerated.  Do not feel patient is safe for d/c home currently but will monitor progress with [place] with hopes for d/c home with HHPT.      Follow Up Recommendations  Home health PT;Supervision/Assistance - 24 hour         Barriers to Discharge Decreased caregiver support      Equipment Recommendations  Rolling walker with 5" wheels    Recommendations for Other Services Other (comment) (DARKO SHOE)  Frequency Min 3X/week   Plan Discharge plan needs to be updated    Precautions / Restrictions Precautions Precautions: Fall Restrictions Weight Bearing Restrictions: No   Pertinent Vitals/Pain 4-5/10    Mobility  Bed Mobility Bed Mobility: Rolling Right;Right Sidelying to Sit Rolling Right: 4: Min guard Right Sidelying to Sit: 4: Min guard Sitting - Scoot to [employer] of Bed: 5: Supervision Details for Bed Mobility Assistance: vc Transfers Transfers: Sit to Stand;Stand to Sit Sit to Stand: 4: Min assist;From bed Stand to Sit: 4: Min assist;To chair/3-in-1 Details for Transfer Assistance: (A) to initiate transfer and slowly descend to chair with max cues for hand placement Ambulation/Gait Ambulation/Gait Assistance: 4: Min assist Ambulation Distance (Feet): 12 Feet (4 backwards steps) Assistive device: Rolling walker Ambulation/Gait Assistance Details: Assist to keep RLE WBing decreased; poor compliance with partial WBing. Difficulty bearing weight through UEs; max VCs for  technique, sequencing and WBing Gait Pattern: Step-to pattern;Decreased stance time - right;Shuffle;Trunk flexed;Narrow base of support Gait velocity: decreased due to pain Stairs: No        PT Diagnosis: Difficulty walking;Generalized weakness;Acute pain  PT Problem List: Decreased strength;Decreased activity tolerance;Decreased balance;Decreased mobility;Decreased knowledge of use of DME;Pain PT Treatment Interventions: DME instruction;Gait training;Functional mobility training;Therapeutic activities;Therapeutic exercise;Balance training;Patient/family education   PT Goals Acute Rehab PT Goals PT Goal Formulation: With patient Time For Goal Achievement: 08/04/12 Potential to Achieve Goals: Good Pt will go Supine/Side to Sit: with modified independence PT Goal: Supine/Side to Sit - Progress: Progressing toward goal Pt will go Sit to Stand: with modified independence PT Goal: Sit to Stand - Progress: Progressing toward goal Pt will go Stand to Sit: with modified independence PT Goal: Stand to Sit - Progress: Progressing toward goal Pt will Ambulate: >150 feet;with modified independence;with rolling walker PT Goal: Ambulate - Progress: Progressing toward goal  Visit Information  Last PT Received On: 07/31/12 Assistance Needed: +1    Subjective Data  Subjective: "I would like to get as much practice as possible" Patient Stated Goal: To go home   Cognition  Cognition Arousal/Alertness: Awake/alert Behavior During Therapy: WFL for tasks assessed/performed Overall Cognitive Status: No family/caregiver present to determine baseline cognitive functioning (slow processing. min A with problem solving)    Balance  Balance Balance Assessed: Yes Static Sitting Balance Static Sitting - Balance Support: Feet supported Static Sitting - Level of Assistance: 6: Modified independent (Device/Increase time) Dynamic Standing Balance Dynamic Standing - Balance Support: Right upper extremity  supported;During functional activity Dynamic Standing - Level of Assistance: 3: Mod assist Dynamic Standing - Balance Activities: Other (  comment) (hygiene and bathing) Dynamic Standing - Comments: Pt with difficulty shifting weight onto upoperated side, poor WBing compliance, difficulty maintaining balance while performing activities.  End of Session PT - End of Session Equipment Utilized During Treatment: Gait belt Activity Tolerance: Patient limited by pain Patient left: in chair;with call bell/phone within reach Nurse Communication: Mobility status   GP     Duncan Dull 07/31/2012, 4:04 PM Alben Deeds, [place] DPT  [phone]

## 2012-07-31 NOTE — Progress Notes (Signed)
07/31/2012 0815 Pt. C/o sharp chest pain on inspiration 6/10. Pt. States this pain is intermittent. EKG performed. No acute changes. Dr. Clementeen Graham on floor and made aware. No orders received at this time. MD to see patient.  [name], [name]

## 2012-07-31 NOTE — Progress Notes (Addendum)
Occupational Therapy Evaluation Patient Details Name: [name] MRN: [id number] DOB: 1950-12-07 Today's Date: 07/31/2012 Time: [id number] OT Time Calculation (min): 33 min  OT Assessment / Plan / Recommendation Clinical Impression  Patient was admitted with a blood sugar of 600 and sepsis. Had blistering to the dorsal aspect of the right foot and obvious necrosis of the third toe. Plain x-rays were consistent with possible osteo-of the third toe distal phalanx confirmed by MRI scan. Clinically the entire third toe was necrotic. The patient is taken for probable MTP amputation and debridement of any nonviable tissue. He is a long-term diabetic and has not taken his medicines for the last year resulting in the diabetic ketoacidosis. S/p R 3rd toe amputation.  Pt will benefit from skilled OT services to facilitate D/C to home with HHOT due to below deficits. Pt would benefit from [place] to assist with maintaining PWB status. Discussed with Anderson Malta, [place].  Pt will need to be mod I to D/C home as he lives alone. Pt will most likely be able to D/C home @ Wednesday after OT/PT pending progress if medically stable.  Rec SW consult to assist with any community resources available. Pt uses his moped to currently get groceries, etc.    OT Assessment  Patient needs continued OT Services    Follow Up Recommendations  Home health OT    Barriers to Discharge None    Equipment Recommendations  Tub/shower seat    Recommendations for Other Services  SW consult  Frequency  Min 3X/week    Precautions / Restrictions Precautions Precautions: Fall Restrictions Weight Bearing Restrictions: No   Pertinent Vitals/Pain 4. nsg aware    ADL  Grooming: Set up;Wash/dry hands;Wash/dry face;Performed Where Assessed - Grooming: Unsupported sitting Upper Body Bathing: Set up Where Assessed - Upper Body Bathing: Unsupported sitting Lower Body Bathing: Moderate assistance Where Assessed - Lower Body  Bathing: Supported sit to stand Upper Body Dressing: Set up Where Assessed - Upper Body Dressing: Unsupported sitting Lower Body Dressing: Moderate assistance Where Assessed - Lower Body Dressing: Supported sit to stand Toilet Transfer: Minimal assistance Toilet Transfer Method: Sit to stand Toileting - Clothing Manipulation and Hygiene: Moderate assistance Where Assessed - Toileting Clothing Manipulation and Hygiene: Sit to stand from 3-in-1 or toilet Equipment Used: Gait belt;Rolling walker Transfers/Ambulation Related to ADLs: mod a for mobility due to PWB status - Mod A for balance ADL Comments: decrease in funcitonal status    OT Diagnosis: Generalized weakness;Acute pain  OT Problem List: Decreased strength;Decreased range of motion;Decreased activity tolerance;Impaired balance (sitting and/or standing);Decreased safety awareness;Decreased knowledge of use of DME or AE;Decreased knowledge of precautions;Pain OT Treatment Interventions: Self-care/ADL training;Therapeutic exercise;Energy conservation;DME and/or AE instruction;Therapeutic activities;Patient/family education;Balance training   OT Goals Acute Rehab OT Goals OT Goal Formulation: With patient Time For Goal Achievement: 09/14/12 Potential to Achieve Goals: Good ADL Goals Pt Will Perform Lower Body Bathing: with modified independence;Sit to stand from chair;Unsupported;with adaptive equipment ADL Goal: Lower Body Bathing - Progress: Goal set today Pt Will Perform Lower Body Dressing: with modified independence;Sit to stand from chair;Unsupported;with adaptive equipment ADL Goal: Lower Body Dressing - Progress: Goal set today Pt Will Transfer to Toilet: with modified independence;Ambulation;3-in-1 ADL Goal: Toilet Transfer - Progress: Goal set today Pt Will Perform Toileting - Clothing Manipulation: with modified independence;Standing ADL Goal: Toileting - Clothing Manipulation - Progress: Goal set today Pt Will Perform  Toileting - Hygiene: with modified independence;Standing at 3-in-1/toilet ADL Goal: Toileting - Hygiene - Progress: Goal set today  Additional ADL Goal #1: independent with donning/doffing R Darco shoe ADL Goal: Additional Goal #1 - Progress: Goal set today Additional ADL Goal #2: mod I with tiem mgnt during functional task @ RW level. ADL Goal: Additional Goal #2 - Progress: Goal set today  Visit Information  Last OT Received On: 07/31/12 Assistance Needed: +1    Subjective Data      Prior Functioning     Home Living Lives With: Alone Available Help at Discharge: Family;Available 24 hours/day Type of Home: Apartment Home Access: Level entry Home Layout: One level Bathroom Shower/Tub: Walk-in Sales promotion account executive: Handicapped height Bathroom Accessibility: Yes How Accessible: Accessible via wheelchair;Accessible via walker Home Adaptive Equipment: Grab bars in shower;Grab bars around toilet;Straight cane Prior Function Level of Independence: Independent with assistive device(s) (SPC) Able to Take Stairs?: No Driving: No Vocation: On disability Comments: drove scooter to get groceries Communication Communication: No difficulties Dominant Hand: Right         Vision/Perception Vision - History Baseline Vision: No visual deficits   Cognition  Cognition Arousal/Alertness: Awake/alert Behavior During Therapy: WFL for tasks assessed/performed Overall Cognitive Status: No family/caregiver present to determine baseline cognitive functioning (slow processing. min A with problem solving)    Extremity/Trunk Assessment Right Upper Extremity Assessment RUE ROM/Strength/Tone: WFL for tasks assessed Left Upper Extremity Assessment LUE ROM/Strength/Tone: WFL for tasks assessed Right Lower Extremity Assessment RLE ROM/Strength/Tone: Unable to fully assess;Due to pain (At least 3/5 with functional mobility) RLE Sensation: History of peripheral neuropathy Left Lower  Extremity Assessment LLE ROM/Strength/Tone: Within functional levels LLE Sensation: History of peripheral neuropathy Trunk Assessment Trunk Assessment: Normal     Mobility Bed Mobility Bed Mobility: Rolling Right;Right Sidelying to Sit Rolling Right: 4: Min guard Right Sidelying to Sit: 4: Min guard Sitting - Scoot to [employer] of Bed: 5: Supervision Details for Bed Mobility Assistance: vc Transfers Transfers: Sit to Stand;Stand to Sit Sit to Stand: 4: Min assist;From bed Stand to Sit: 4: Min assist;To chair/3-in-1 Details for Transfer Assistance: (A) to initiate transfer and slowly descend to chair with max cues for hand placement     Exercise     Balance Balance Balance Assessed: Yes Static Sitting Balance Static Sitting - Balance Support: Feet supported Static Sitting - Level of Assistance: 6: Modified independent (Device/Increase time) Dynamic Standing Balance Dynamic Standing - Balance Support: Right upper extremity supported;During functional activity Dynamic Standing - Level of Assistance: 3: Mod assist Dynamic Standing - Balance Activities: Other (comment) (hygiene and bathing) Dynamic Standing - Comments: Pt with difficulty shifting weight onto upoperated side, poor WBing compliance, difficulty maintaining balance while performing activities.   End of Session OT - End of Session Equipment Utilized During Treatment: Gait belt Activity Tolerance: Patient tolerated treatment well Patient left: in chair;with call bell/phone within reach Nurse Communication: Mobility status  GO     Lakeeta Dobosz,HILLARY 07/31/2012, 4:07 PM [facility], OTR/L  [phone] 07/31/2012

## 2012-07-31 NOTE — Progress Notes (Signed)
S: Patient is awake, alert, and oriented this morning. He reports continued improvement in his foot pain. No nausea or vomiting. Passing flatus.  He was complaining of chest pain earlier this morning.  This seems to have improved.  EKG was obtained and showed no acute changes.  Internal medicine was notified.  O: Right foot dressing is clean and dry  A: #1 s/p right third toe amputation Saturday  #2 Diabetic ketoacidosis - managed by medicine service.   P: Continue dressing changes twice daily. Wash with peroxide and then apply 4x4 gauze and kerlix. Partial weightbearing right foot.

## 2012-07-31 NOTE — Progress Notes (Signed)
[facility] PROGRESS NOTE  [name] [id number] DOB: 01-05-1951 DOA: 07/27/2012 PCP: No PCP Per Patient  Brief narrative:  62 year old male with history of diabetes mellitus ( was on oral hypoglycemics until one year back and stopped taking his meds), CAD status post CABG X4 in 2008 history of CVA, history of tobacco and alcohol use presented to the ED after a mechanical fall at home and hurt his neck. Also complained of pain over his right toe that he has noticed since 3 days.patient noted to have gangrene of his right 3rd toe with sepsis and DKA. admitted to stepdown.   Assessment/Plan:  DKA (diabetic ketoacidoses)  In the setting of sepsis and severe non compliance  Started on insulin drip on admission.AG closed and placed on levemir. A1C of 10.9.  IncreaseD levemir dose and add premeal aspart  .  Severe sepsis  marked leucocytosis, elevated lactate secondary to gangrenous toe. X ray shows osteomyelitis. MRI rt foot shows osteomyelitis of rt third toe with cellulitis of foot.  Empirically covering with IV vanco and zosyn. Blood cx so far no growth . Appreciate ortho recommendations. Toe amputation done on 5/24.  tolerated well. continue dressing. Pain control  Temp spike today, check repeat blood cx, UA. CXR negative.  Active Problems:  H/O: CVA (cerebrovascular accident)  No residual weakness. continue ASA   Hypertension  Not on any BP meds. On perioperative BB.   CAD S/P CABG x 4 in 2008  informs seeing Dr Terrence Dupont infrequently. Discussed with him on 5/22. No further recommendations   Anemia  Low MCV noted on iron panel. No recent blood work. Drop in H&H noted. No gross Gi bleed. Stool for occult blood pending. 2U PRBC given with good improvement in H&H. Monitor closely.   Tobacco abuse/ Alcohol abuse  Reports to have quit   DVT prophylaxis  Sq lovenox   Diet: Diabetic   Code Status:full   Family Communication:none at bedside   Disposition Plan: currently  inpatient . PT eval  Consultants:  Ortho ( Dr Berenice Primas)    Procedures:  Rt third toe MTP amputation on 5/24    Antibiotics:  IV vnaco / zosyn: 5/22>>   HPI/Subjective:  Fever overnight. C/o chest discomfort with burning  this a. Given Maalox with relief . EKG unremarkable   Objective: Filed Vitals:   07/30/12 1955 07/31/12 0411 07/31/12 0900 07/31/12 1351  BP: 171/79 114/76 153/64 148/64  Pulse: 89 83 85 81  Temp: 100.7 F (38.2 C) 100.1 F (37.8 C)  101.8 F (38.8 C)  TempSrc: Oral Oral  Oral  Resp: 18 18  18   Height:      Weight:  88.9 kg (195 lb 15.8 oz)    SpO2: 96% 97%  100%    Intake/Output Summary (Last 24 hours) at 07/31/12 1423 Last data filed at 07/31/12 1352  Gross per 24 hour  Intake   2150 ml  Output   1050 ml  Net   1100 ml   Filed Weights   07/28/12 0600 07/29/12 0036 07/31/12 0411  Weight: 82.2 kg (181 lb 3.5 oz) 84 kg (185 lb 3 oz) 88.9 kg (195 lb 15.8 oz)    Exam:  General: middle aged male in NAD  HEENT: no pallor, moist mucosa  Cardiovascular: S1&S2 normal, 3/6 systolic murmur  Respiratory: clear b/l, no added sounds  Abdomen: soft, NT, ND, BS+ Musculoskeletal:right foot dressing. Trace edema over right foot extending to distal tibia  CNS: AAOX3   Data Reviewed: Basic Metabolic  Panel:  Recent Labs Lab 07/27/12 1631 07/27/12 2106 07/27/12 2303 07/28/12 0058 07/28/12 0257  NA 126* 132* 134* 134* 135  K 4.4 5.0 3.6 3.6 3.7  CL 82* 93* 95* 96 97  CO2 14* 21 25 26 26   GLUCOSE 669* 370* 330* 264* 158*  BUN 42* 36* 36* 35* 35*  CREATININE 1.35 1.13 1.16 1.20 1.23  CALCIUM 9.6 9.4 9.1 9.2 9.3   Liver Function Tests:  Recent Labs Lab 07/27/12 1631  AST 21  ALT 14  ALKPHOS 149*  BILITOT 0.7  PROT 8.3  ALBUMIN 2.2*   No results found for this basename: LIPASE, AMYLASE,  in the last 168 hours No results found for this basename: AMMONIA,  in the last 168 hours CBC:  Recent Labs Lab 07/27/12 1631 07/27/12 2106  07/28/12 0257 07/29/12 0849 07/30/12 0440 07/31/12 0400  WBC 29.1* 28.3* 24.8* 25.3* 21.3* 25.8*  NEUTROABS 24.8*  --   --   --   --   --   HGB 8.4* 9.0* 8.2* 6.5* 10.7* 9.1*  HCT 24.7* 25.7* 23.5* 18.8* 29.8* 25.7*  MCV 78.7 76.7* 77.0* 77.0* 77.0* 76.7*  PLT 376 359 367 371 339 332   Cardiac Enzymes: No results found for this basename: CKTOTAL, CKMB, CKMBINDEX, TROPONINI,  in the last 168 hours BNP (last 3 results) No results found for this basename: PROBNP,  in the last 8760 hours CBG:  Recent Labs Lab 07/30/12 1150 07/30/12 1635 07/30/12 2041 07/31/12 0605 07/31/12 1131  GLUCAP 254* 177* 149* 197* 222*    Recent Results (from the past 240 hour(s))  CULTURE, BLOOD (ROUTINE X 2)     Status: None   Collection Time    07/27/12  4:40 PM      Result Value Range Status   Specimen Description BLOOD LEFT ANTECUBITAL   Final   Special Requests BOTTLES DRAWN AEROBIC ONLY 4CC   Final   Culture  Setup Time 07/27/2012 21:39   Final   Culture     Final   Value:        BLOOD CULTURE RECEIVED NO GROWTH TO DATE CULTURE WILL BE HELD FOR 5 DAYS BEFORE ISSUING A FINAL NEGATIVE REPORT   Report Status PENDING   Incomplete  CULTURE, BLOOD (ROUTINE X 2)     Status: None   Collection Time    07/27/12  4:50 PM      Result Value Range Status   Specimen Description BLOOD RIGHT HAND   Final   Special Requests BOTTLES DRAWN AEROBIC ONLY 1CC   Final   Culture  Setup Time 07/27/2012 21:38   Final   Culture     Final   Value:        BLOOD CULTURE RECEIVED NO GROWTH TO DATE CULTURE WILL BE HELD FOR 5 DAYS BEFORE ISSUING A FINAL NEGATIVE REPORT   Report Status PENDING   Incomplete  URINE CULTURE     Status: None   Collection Time    07/27/12  5:10 PM      Result Value Range Status   Specimen Description URINE, CLEAN CATCH   Final   Special Requests NONE   Final   Culture  Setup Time 07/27/2012 21:57   Final   Colony Count NO GROWTH   Final   Culture NO GROWTH   Final   Report Status  07/28/2012 FINAL   Final  MRSA PCR SCREENING     Status: None   Collection Time    07/27/12  9:17 PM      Result Value Range Status   MRSA by PCR NEGATIVE  NEGATIVE Final   Comment:            The GeneXpert MRSA Assay (FDA     approved for NASAL specimens     only), is one component of a     comprehensive MRSA colonization     surveillance program. It is not     intended to diagnose MRSA     infection nor to guide or     monitor treatment for     MRSA infections.     Studies: Dg Chest Port 1 View  07/31/2012   *RADIOLOGY REPORT*  Clinical Data: Cough and fever, evaluate for pneumonia  PORTABLE CHEST - 1 VIEW  Comparison: 07/27/2012; 07/20/2012; 01/27/1999  Findings: Grossly unchanged cardiac silhouette and mediastinal contours given reduced lung volumes and patient rotation.  Post median sternotomy and CABG.  There is persistent mild elevation of the right hemidiaphragm.  No focal airspace opacities.  No pleural effusion or pneumothorax.  No definite evidence of edema. Unchanged bones.  IMPRESSION: No definite acute cardiopulmonary disease on this low lung volume AP portable exam. Further evaluation with a PA and lateral chest radiograph may be obtained as clinically indicated.   Original Report Authenticated By: Jake Seats, MD    Scheduled Meds: . aspirin  81 mg Oral Daily  . enoxaparin (LOVENOX) injection  40 mg Subcutaneous Q24H  . famotidine (PEPCID) IV  20 mg Intravenous Once  . famotidine  10 mg Oral BID  . insulin aspart  0-15 Units Subcutaneous TID WC  . insulin aspart  3 Units Subcutaneous TID WC  . insulin detemir  25 Units Subcutaneous Q24H  . metoprolol tartrate  25 mg Oral BID  . piperacillin-tazobactam (ZOSYN)  IV  3.375 g Intravenous Q8H  . polyethylene glycol  17 g Oral Daily  . sodium chloride  3 mL Intravenous Q12H  . vancomycin  1,000 mg Intravenous Q12H   Continuous Infusions: . sodium chloride 20 mL/hr at 07/31/12 1343      Time spent: 25  minutes    Ravneet Spilker, [facility] Pager [phone] If 7PM-7AM, please contact night-coverage at [email or web address], password [facility] 07/31/2012, 2:23 PM  LOS: 4 days

## 2012-08-01 ENCOUNTER — Encounter (HOSPITAL_COMMUNITY): Payer: Self-pay | Admitting: Anesthesiology

## 2012-08-01 DIAGNOSIS — I96 Gangrene, not elsewhere classified: Secondary | ICD-10-CM

## 2012-08-01 LAB — CBC
MCH: 26.6 pg (ref 26.0–34.0)
MCHC: 34.2 g/dL (ref 30.0–36.0)
MCV: 77.9 fL — ABNORMAL LOW (ref 78.0–100.0)
Platelets: 333 10*3/uL (ref 150–400)

## 2012-08-01 LAB — PREPARE RBC (CROSSMATCH)

## 2012-08-01 MED ORDER — ACETAMINOPHEN 325 MG PO TABS
650.0000 mg | ORAL_TABLET | Freq: Once | ORAL | Status: AC
  Administered 2012-08-01: 650 mg via ORAL

## 2012-08-01 MED ORDER — IBUPROFEN 800 MG PO TABS
800.0000 mg | ORAL_TABLET | Freq: Once | ORAL | Status: AC
  Administered 2012-08-01: 800 mg via ORAL
  Filled 2012-08-01: qty 1

## 2012-08-01 MED ORDER — INSULIN ASPART 100 UNIT/ML ~~LOC~~ SOLN: 3.0000 [IU] | Freq: Three times a day (TID) | SUBCUTANEOUS | Status: DC

## 2012-08-01 NOTE — Progress Notes (Signed)
[facility] PROGRESS NOTE  [name] [id number] DOB: 06-Feb-1951 DOA: 07/27/2012 PCP: No PCP Per Patient  Brief narrative:  62 year old male with history of diabetes mellitus ( was on oral hypoglycemics until one year back and stopped taking his meds), CAD status post CABG X4 in 2008 history of CVA, history of tobacco and alcohol use presented to the ED after a mechanical fall at home and hurt his neck. Also complained of pain over his right toe that he has noticed since 3 days.patient noted to have gangrene of his right 3rd toe with sepsis and DKA. admitted to stepdown.   Assessment/Plan:   Severe sepsis  marked leucocytosis, elevated lactate secondary to gangrenous third right toe. X ray shows osteomyelitis. MRI rt foot shows osteomyelitis of rt third toe with cellulitis of foot.  Empirically covering with IV vanco and zosyn. Blood cx so far no growth .  Appreciate ortho recommendations. Third Toe amputation done on 5/24. However he still has fever and other toe appear necrotic. ABI ordered and per ortho . <0.9 in both legs.  he will likely need amputation of other toes and debridement of  necrotic tissue to the plantar aspect of his right foot.  Pain control   repeat blood cx CXR negative. Pending urine cx  DKA (diabetic ketoacidoses)  In the setting of sepsis and severe non compliance  Started on insulin drip on admission.AG closed and placed on levemir. A1C of 10.9.  Increased levemir dose and add premeal aspart   .   Active Problems:  H/O: CVA (cerebrovascular accident)  No residual weakness. continue ASA   Hypertension  Not on any BP meds. On perioperative BB.   CAD S/P CABG x 4 in 2008  informs seeing Dr Terrence Dupont infrequently. Discussed with him on 5/22. No further recommendations   Anemia  Low MCV noted on iron panel. No recent blood work. Drop in H&H noted. No gross Gi bleed. Stool for occult blood pending. 2U PRBC given on the day of surgery. Hb of 8 again. Will  order 2 U prbc.  Tobacco abuse/ Alcohol abuse  Reports to have quit   Low urine output on 5/27 Check bladder scan with almost 400 cc residue. Ordered foley.  DVT prophylaxis  Sq lovenox   Diet: Diabetic   Code Status:full  Family Communication:sister at bedside  Disposition Plan: possible further amputation of toes.    Consultants:  Ortho ( Dr Berenice Primas)  Procedures:  Rt third toe MTP amputation on 5/24 .   Antibiotics:  IV vnaco / zosyn: 5/22>>   HPI/Subjective:  Still has temp spikes. Other toes appear necrotic on exam. Nurse informs of poor urine outptut today   Objective: Filed Vitals:   08/01/12 0349 08/01/12 0357 08/01/12 0553 08/01/12 1300  BP: 180/69 170/80 118/60 154/61  Pulse: 80   71  Temp: 100.3 F (37.9 C)   100.2 F (37.9 C)  TempSrc: Oral   Oral  Resp: 18   18  Height:      Weight:      SpO2: 95%   100%    Intake/Output Summary (Last 24 hours) at 08/01/12 1646 Last data filed at 08/01/12 0945  Gross per 24 hour  Intake    600 ml  Output    850 ml  Net   -250 ml   Filed Weights   07/28/12 0600 07/29/12 0036 07/31/12 0411  Weight: 82.2 kg (181 lb 3.5 oz) 84 kg (185 lb 3 oz) 88.9 kg (195 lb 15.8  oz)    Exam: General: middle aged male in NAD  HEENT: no pallor, moist mucosa  Cardiovascular: S1&S2 normal, 3/6 systolic murmur  Respiratory: clear b/l, no added sounds  Abdomen: soft, NT, ND, BS+  Musculoskeletal:right foot dressing. Trace edema over right foot extending to distal tibia . Other toes appears necrotic as well CNS: AAOX3      Data Reviewed: Basic Metabolic Panel:  Recent Labs Lab 07/27/12 1631 07/27/12 2106 07/27/12 2303 07/28/12 0058 07/28/12 0257  NA 126* 132* 134* 134* 135  K 4.4 5.0 3.6 3.6 3.7  CL 82* 93* 95* 96 97  CO2 14* 21 25 26 26   GLUCOSE 669* 370* 330* 264* 158*  BUN 42* 36* 36* 35* 35*  CREATININE 1.35 1.13 1.16 1.20 1.23  CALCIUM 9.6 9.4 9.1 9.2 9.3   Liver Function Tests:  Recent Labs Lab  07/27/12 1631  AST 21  ALT 14  ALKPHOS 149*  BILITOT 0.7  PROT 8.3  ALBUMIN 2.2*   No results found for this basename: LIPASE, AMYLASE,  in the last 168 hours No results found for this basename: AMMONIA,  in the last 168 hours CBC:  Recent Labs Lab 07/27/12 1631  07/28/12 0257 07/29/12 0849 07/30/12 0440 07/31/12 0400 08/01/12 0520  WBC 29.1*  < > 24.8* 25.3* 21.3* 25.8* 23.3*  NEUTROABS 24.8*  --   --   --   --   --   --   HGB 8.4*  < > 8.2* 6.5* 10.7* 9.1* 8.2*  HCT 24.7*  < > 23.5* 18.8* 29.8* 25.7* 24.0*  MCV 78.7  < > 77.0* 77.0* 77.0* 76.7* 77.9*  PLT 376  < > 367 371 339 332 333  < > = values in this interval not displayed. Cardiac Enzymes: No results found for this basename: CKTOTAL, CKMB, CKMBINDEX, TROPONINI,  in the last 168 hours BNP (last 3 results) No results found for this basename: PROBNP,  in the last 8760 hours CBG:  Recent Labs Lab 07/31/12 1649 07/31/12 2047 08/01/12 0555 08/01/12 1112 08/01/12 1613  GLUCAP 215* 248* 151* 177* 146*    Recent Results (from the past 240 hour(s))  CULTURE, BLOOD (ROUTINE X 2)     Status: None   Collection Time    07/27/12  4:40 PM      Result Value Range Status   Specimen Description BLOOD LEFT ANTECUBITAL   Final   Special Requests BOTTLES DRAWN AEROBIC ONLY 4CC   Final   Culture  Setup Time 07/27/2012 21:39   Final   Culture     Final   Value:        BLOOD CULTURE RECEIVED NO GROWTH TO DATE CULTURE WILL BE HELD FOR 5 DAYS BEFORE ISSUING A FINAL NEGATIVE REPORT   Report Status PENDING   Incomplete  CULTURE, BLOOD (ROUTINE X 2)     Status: None   Collection Time    07/27/12  4:50 PM      Result Value Range Status   Specimen Description BLOOD RIGHT HAND   Final   Special Requests BOTTLES DRAWN AEROBIC ONLY 1CC   Final   Culture  Setup Time 07/27/2012 21:38   Final   Culture     Final   Value:        BLOOD CULTURE RECEIVED NO GROWTH TO DATE CULTURE WILL BE HELD FOR 5 DAYS BEFORE ISSUING A FINAL NEGATIVE  REPORT   Report Status PENDING   Incomplete  URINE CULTURE     Status:  None   Collection Time    07/27/12  5:10 PM      Result Value Range Status   Specimen Description URINE, CLEAN CATCH   Final   Special Requests NONE   Final   Culture  Setup Time 07/27/2012 21:57   Final   Colony Count NO GROWTH   Final   Culture NO GROWTH   Final   Report Status 07/28/2012 FINAL   Final  MRSA PCR SCREENING     Status: None   Collection Time    07/27/12  9:17 PM      Result Value Range Status   MRSA by PCR NEGATIVE  NEGATIVE Final   Comment:            The GeneXpert MRSA Assay (FDA     approved for NASAL specimens     only), is one component of a     comprehensive MRSA colonization     surveillance program. It is not     intended to diagnose MRSA     infection nor to guide or     monitor treatment for     MRSA infections.  CULTURE, BLOOD (ROUTINE X 2)     Status: None   Collection Time    07/31/12  2:37 PM      Result Value Range Status   Specimen Description BLOOD RIGHT ARM   Final   Special Requests BOTTLES DRAWN AEROBIC ONLY 10CC   Final   Culture  Setup Time 07/31/2012 23:11   Final   Culture     Final   Value:        BLOOD CULTURE RECEIVED NO GROWTH TO DATE CULTURE WILL BE HELD FOR 5 DAYS BEFORE ISSUING A FINAL NEGATIVE REPORT   Report Status PENDING   Incomplete  CULTURE, BLOOD (ROUTINE X 2)     Status: None   Collection Time    07/31/12  3:15 PM      Result Value Range Status   Specimen Description BLOOD RIGHT ARM   Final   Special Requests BOTTLES DRAWN AEROBIC AND ANAEROBIC 10CC   Final   Culture  Setup Time 07/31/2012 23:12   Final   Culture     Final   Value:        BLOOD CULTURE RECEIVED NO GROWTH TO DATE CULTURE WILL BE HELD FOR 5 DAYS BEFORE ISSUING A FINAL NEGATIVE REPORT   Report Status PENDING   Incomplete     Studies: Dg Chest Port 1 View  07/31/2012   *RADIOLOGY REPORT*  Clinical Data: Cough and fever, evaluate for pneumonia  PORTABLE CHEST - 1 VIEW   Comparison: 07/27/2012; 07/20/2012; 01/27/1999  Findings: Grossly unchanged cardiac silhouette and mediastinal contours given reduced lung volumes and patient rotation.  Post median sternotomy and CABG.  There is persistent mild elevation of the right hemidiaphragm.  No focal airspace opacities.  No pleural effusion or pneumothorax.  No definite evidence of edema. Unchanged bones.  IMPRESSION: No definite acute cardiopulmonary disease on this low lung volume AP portable exam. Further evaluation with a PA and lateral chest radiograph may be obtained as clinically indicated.   Original Report Authenticated By: Jake Seats, MD    Scheduled Meds: . aspirin  81 mg Oral Daily  . enoxaparin (LOVENOX) injection  40 mg Subcutaneous Q24H  . famotidine  10 mg Oral BID  . insulin aspart  0-15 Units Subcutaneous TID WC  . insulin aspart  3 Units Subcutaneous TID WC  .  insulin detemir  25 Units Subcutaneous Q24H  . metoprolol tartrate  25 mg Oral BID  . piperacillin-tazobactam (ZOSYN)  IV  3.375 g Intravenous Q8H  . polyethylene glycol  17 g Oral Daily  . sodium chloride  3 mL Intravenous Q12H  . vancomycin  1,000 mg Intravenous Q12H   Continuous Infusions: . sodium chloride 20 mL/hr at 07/31/12 1343      Time spent:35 minutes    Navi Erber  [facility] Pager [phone]. If 7PM-7AM, please contact night-coverage at [email or web address], password [facility] 08/01/2012, 4:46 PM  LOS: 5 days

## 2012-08-01 NOTE — Clinical Documentation Improvement (Signed)
Anemia Blood Loss Clarification  THIS DOCUMENT IS NOT A PERMANENT PART OF THE MEDICAL RECORD  RESPOND TO THE THIS QUERY, FOLLOW THE INSTRUCTIONS BELOW:  1. If needed, update documentation for the patient's encounter via the notes activity.  2. Access this query again and click edit on the In [employer].  3. After updating, or not, click F2 to complete all highlighted (required) fields concerning your review. Select "additional documentation in the medical record" OR "no additional documentation provided".  4. Click Sign note button.  5. The deficiency will fall out of your In Basket *Please let us know if you are not able to complete this workflow by phone or e-mail (listed below).        08/01/12  Dear Dr.Dhungel Rolley Sims  In an effort to better capture your patient's severity of illness, reflect appropriate length of stay and utilization of resources, a review of the patient medical record has revealed the following indicators.    Based on your clinical judgment, please clarify and document in a progress note and/or discharge summary the clinical condition associated with the following supporting information:  In responding to this query please exercise your independent judgment.  The fact that a query is asked, does not imply that any particular answer is desired or expected.   Possible Clinical Conditions?   " Expected Acute Blood Loss Anemia  " Acute Blood Loss Anemia  " Acute on chronic blood loss anemia  " Precipitous drop in Hematocrit  " Other Condition  " Cannot Clinically Determine    Risk Factors:  Anemia noted per 5/24 progress notes.   Diagnostics: H&H on 5/25:   10.7/29.8 H&H on 5/24:     6.5/18.8   Transfusion: 5/24:  2units PRBC    Reviewed: Anemia of blood loss documented per 5/31 progress notes.  Thank You,  Theron Arista,  Clinical Documentation Specialist:  Phone: [place]

## 2012-08-01 NOTE — Progress Notes (Signed)
Physical Therapy Treatment Patient Details Name: [name] MRN: [id number] DOB: [birth year] Today's Date: 08/01/2012 Time: [id number] PT Time Calculation (min): 31 min  PT Assessment / Plan / Recommendation Comments on Treatment Session  Worked with patient using Darko shoe for compliance with wt bearing restrictions. Pt with better compliance but continues to demonstrate unsafe behaviors and shows no carryover of instructions. At this time feel that patient is unsafe to return home alone. Rec updating discharge to SNF to improve mobility, address deficits as indicated and ensure patient safety prior to returning home.     Follow Up Recommendations  SNF     Does the patient have the potential to tolerate intense rehabilitation     Barriers to Discharge  Patient lives alone and has decreased caregiver support available      Equipment Recommendations  Rolling walker with 5" wheels    Recommendations for Other Services    Frequency Min 3X/week   Plan Discharge plan needs to be updated    Precautions / Restrictions Precautions Precautions: Fall Required Braces or Orthoses: Other Brace/Splint (DARCO shoe) Restrictions Weight Bearing Restrictions: Yes RLE Weight Bearing: Partial weight bearing   Pertinent Vitals/Pain 4-5/10    Mobility  Bed Mobility Bed Mobility: Supine to Sit;Sitting - Scoot to Edge of Bed Rolling Right: 4: Min guard Right Sidelying to Sit: 4: Min guard Details for Bed Mobility Assistance: VCs and Max time needed to perform; patietn requiring significant amount of encouragement and VCs to carry out task. Difficulty initiating tasks  Transfers Transfers: Sit to Stand;Stand to Sit Sit to Stand: 4: Min assist;From bed Stand to Sit: 4: Min assist;To chair/3-in-1 Details for Transfer Assistance: (A) to initiate transfer and slowly descend to chair with max cues for hand placement; increased time required and max cues for  encouragement Ambulation/Gait Ambulation/Gait Assistance: 4: Min assist Ambulation Distance (Feet): 22 Feet Assistive device: Rolling walker Ambulation/Gait Assistance Details: Max cues for sequencing with no carry over;, better compliance with darko shoe Gait Pattern: Step-to pattern;Decreased stance time - right;Shuffle;Trunk flexed;Narrow base of support Gait velocity: decreased due to pain Stairs: No      PT Goals Acute Rehab PT Goals PT Goal Formulation: With patient Time For Goal Achievement: 08/04/12 Potential to Achieve Goals: Good Pt will go Supine/Side to Sit: with modified independence PT Goal: Supine/Side to Sit - Progress: Progressing toward goal Pt will go Sit to Stand: with modified independence PT Goal: Sit to Stand - Progress: Progressing toward goal Pt will go Stand to Sit: with modified independence PT Goal: Stand to Sit - Progress: Progressing toward goal Pt will Ambulate: >150 feet;with modified independence;with rolling walker PT Goal: Ambulate - Progress: Progressing toward goal (modestly)  Visit Information  Last PT Received On: 08/01/12 Assistance Needed: +1    Subjective Data  Subjective: You just don't know how much pain there is Patient Stated Goal: To go home   Cognition  Cognition Arousal/Alertness: Awake/alert Behavior During Therapy: Flat affect Overall Cognitive Status: History of cognitive impairments - at baseline (? related to ETOH)    Balance  Balance Balance Assessed: Yes Dynamic Standing Balance Dynamic Standing - Balance Support: Right upper extremity supported;During functional activity Dynamic Standing - Level of Assistance: 5: Stand by assistance Dynamic Standing - Balance Activities: Other (comment) (hygiene and bathing) Dynamic Standing - Comments: Patient still with poor compliance rolling onto forefoot requires cues to correct, patient very slow to perform activities  End of Session PT - End of Session Equipment Utilized  During  Treatment: Gait belt Activity Tolerance: Patient limited by pain Patient left: in chair;with call bell/phone within reach Nurse Communication: Mobility status   GP     [name] 08/01/2012, 9:52 AM [name], PT DPT  [phone]

## 2012-08-01 NOTE — Progress Notes (Signed)
Occupational Therapy Treatment Patient Details Name: [name] MRN: [id number] DOB: [birth year] Today's Date: 08/01/2012 Time: [id number] OT Time Calculation (min): 31 min  OT Assessment / Plan / Recommendation Comments on Treatment Session Concerned over pt's cognitive status regarding D/C plans. Discussed D/C with son, who states that his Dad does not drink anymore; however, pt (+) for ETOH on admission. Son also states that his father does not take his medicines. On the last 2 consecutive visits, pt was unaware of being saturated with urine. When questioned, pt denied incontinence. Do not feel pt will be safe to D/C home, as he does not appear capable of taking care of himself. If pt D/Cs home, feel he will have a very high chance of readmission due to ETOH use, fall risk and further wound complications. Recommend SNF placement. If pt refuses, then recommend adult protective services be involved. Will continue to follow acutely.    Follow Up Recommendations  SNF    Barriers to Discharge       Equipment Recommendations  None recommended by OT    Recommendations for Other Services    Frequency Min 2X/week   Plan Discharge plan needs to be updated    Precautions / Restrictions Precautions Precautions: Fall Required Braces or Orthoses: Other Brace/Splint (DARCO shoe) Restrictions Weight Bearing Restrictions: Yes RLE Weight Bearing: Partial weight bearing   Pertinent Vitals/Pain 4. Back. Requests pain meds    ADL  Grooming: Set up;Supervision/safety Where Assessed - Grooming: Supported standing Upper Body Bathing: Supervision/safety;Set up Where Assessed - Upper Body Bathing: Supported sit to stand Lower Body Bathing: Moderate assistance Where Assessed - Lower Body Bathing: Supported sit to Lobbyist: Minimal assistance Equipment Used: Gait belt;Rolling walker Transfers/Ambulation Related to ADLs: min A/ RW level ADL Comments: REquires increased time for processing  and completion of tasks. Appears internally distracted    OT Diagnosis:    OT Problem List:   OT Treatment Interventions:     OT Goals Acute Rehab OT Goals OT Goal Formulation: With patient Time For Goal Achievement: 08/15/12 Potential to Achieve Goals: Good ADL Goals Pt Will Perform Lower Body Bathing: with modified independence;Sit to stand from chair;Unsupported;with adaptive equipment ADL Goal: Lower Body Bathing - Progress: Progressing toward goals Pt Will Perform Lower Body Dressing: with modified independence;Sit to stand from chair;Unsupported;with adaptive equipment ADL Goal: Lower Body Dressing - Progress: Progressing toward goals Pt Will Transfer to Toilet: with modified independence;Ambulation;3-in-1 ADL Goal: Toilet Transfer - Progress: Progressing toward goals Pt Will Perform Toileting - Clothing Manipulation: with modified independence;Standing ADL Goal: Toileting - Clothing Manipulation - Progress: Progressing toward goals Pt Will Perform Toileting - Hygiene: with modified independence;Standing at 3-in-1/toilet ADL Goal: Toileting - Hygiene - Progress: Progressing toward goals Additional ADL Goal #1: independent with donning/doffing R Darco shoe ADL Goal: Additional Goal #1 - Progress: Progressing toward goals Additional ADL Goal #2: mod I with tiem mgnt during functional task @ RW level. ADL Goal: Additional Goal #2 - Progress: Progressing toward goals  Visit Information  Last OT Received On: 08/01/12 Assistance Needed: +1 PT/OT Co-Evaluation/Treatment: Yes    Subjective Data      Prior Functioning       Cognition  Cognition Arousal/Alertness: Awake/alert Behavior During Therapy: Flat affect Overall Cognitive Status: History of cognitive impairments - at baseline (? related to ETOH)    Mobility  Bed Mobility Bed Mobility: Supine to Sit;Sitting - Scoot to Edge of Bed Rolling Right: 5: Supervision Right Sidelying to Sit: 4: Min guard  Sitting - Scoot to  Edge of Bed: 5: Supervision Details for Bed Mobility Assistance: vc for problem solving how to scoot to EOB and transfer from sit - stand Transfers Transfers: Sit to Stand;Stand to Sit Sit to Stand: 4: Min assist Stand to Sit: 4: Min assist Details for Transfer Assistance: (A) to initiate transfer and slowly descend to chair with max cues for hand placement; increased time required and max cues for encouragement    Exercises      Balance Balance Balance Assessed: Yes Dynamic Standing Balance Dynamic Standing - Balance Support: Right upper extremity supported;During functional activity Dynamic Standing - Level of Assistance: 5: Stand by assistance Dynamic Standing - Balance Activities: Other (comment) (hygiene and bathing) Dynamic Standing - Comments: Patient still with poor compliance rolling onto forefoot requires cues to correct, patient very slow to perform activities   End of Session OT - End of Session Equipment Utilized During Treatment: Gait belt Activity Tolerance: Patient tolerated treatment well Patient left: in chair;with call bell/phone within reach Nurse Communication: Mobility status;Other (comment);Patient requests pain meds (IV attention)  GO     Mikel Pyon,HILLARY 08/01/2012, 9:53 AM [facility], OTR/L  [phone] 08/01/2012

## 2012-08-01 NOTE — Progress Notes (Signed)
08/01/2012 2100 Second page in to MD on call to clarify blood transfusion order.  Will continue to monitor. Perri Aragones, Luciano Cutter , RN

## 2012-08-01 NOTE — Progress Notes (Signed)
Subjective: 3 Days Post-Op Procedure(s) (LRB): AMPUTATION 3RD TOE (Right) Patient reports pain as 2 on 0-10 scale.  Patient is resting comfortably on telemetry where he was placed because of some chest pains. He reports minimal clear drainage from his foot.  Objective: Vital signs in last 24 hours: Temp:  [99.2 F (37.3 C)-101.8 F (38.8 C)] 100.3 F (37.9 C) (05/27 0349) Pulse Rate:  [79-85] 80 (05/27 0349) Resp:  [18] 18 (05/27 0349) BP: (118-180)/(60-80) 118/60 mmHg (05/27 0553) SpO2:  [95 %-100 %] 95 % (05/27 0349)  Intake/Output from previous day: 05/26 0701 - 05/27 0700 In: 600 [P.O.:600] Out: 1075 [Urine:1075] Intake/Output this shift:     Recent Labs  07/29/12 0849 07/30/12 0440 07/31/12 0400 08/01/12 0520  HGB 6.5* 10.7* 9.1* 8.2*    Recent Labs  07/31/12 0400 08/01/12 0520  WBC 25.8* 23.3*  RBC 3.35* 3.08*  HCT 25.7* 24.0*  PLT 332 333   No results found for this basename: NA, K, CL, CO2, BUN, CREATININE, GLUCOSE, CALCIUM,  in the last 72 hours No results found for this basename: LABPT, INR,  in the last 72 hours  Objective: The right foot wound has little if any drainage, but that is probably secondary to a lack of any blood supply. A great toe and the bordering second and fourth toes are developing dry gangrene consistent with his diabetes there is little if any swelling to the foot. There is also a line of demarcation to the foot pad indicating necrosis of the distal 1-2 cm of the foot pad.  Assessment/Plan: Assessment: 1. Status post severe diabetic ketoacidosis, and sepsis, with dry necrosis the tissue of the right foot. 2. Recurrent anemia with a hematocrit of 24 today they will probably require further transfusion per internal medicine.  Plan: We will obtain arterial duplex study of the lower extremity, to get arterial brachial indices. This will mainly be for a baseline. In addition, we had a talk about the need to amputate the second, fourth and  probably great toes as well as the necrotic tissue to the plantar aspect of his right foot. As his tissues are turning black, has dry gangrene on there is really no choice. I will reexamine his foot in 24 hours to see if the area of necrosis is better delineated, and then set him up for amputation of the necrotic tissue.  [name] J 08/01/2012, 7:19 AM

## 2012-08-01 NOTE — Progress Notes (Signed)
MD notified and aware Pt has only voided 100 cc of dark urine all day. Pt states he does not feel the urge to void. Bladder scan performed, resulted 389 ml. Orders given to insert foley catheter. Orders activated. Will continue to monitor.

## 2012-08-01 NOTE — Progress Notes (Signed)
08/01/2012 8:04 PM Recheck of pt temp is 101.4 orally one hour after tylenol dose.  MD on call made aware. Pt is to received two units PRBC tonight.  Awaiting call back. Will continue to monitor. Kailand Seda, Luciano Cutter , RN

## 2012-08-01 NOTE — Progress Notes (Signed)
Pt's temperature is 101.4 prior to blood transfusion. MD made aware, orders given to administer 2 tylenol, wait 1 hour and recheck temperature before transfusing. Will continue to monitor.

## 2012-08-01 NOTE — Progress Notes (Signed)
VASCULAR LAB PRELIMINARY  ARTERIAL  ABI completed:    RIGHT    LEFT    PRESSURE WAVEFORM  PRESSURE WAVEFORM  BRACHIAL 152 triphasic BRACHIAL 151 triphasic  DP   DP    AT 85 monophasic AT 89 monophasic  PT 94 monophasic PT 104 monophasic  PER   PER    GREAT TOE  flat GREAT TOE  flat    RIGHT LEFT  ABI 0.62 0.68     Daleyssa Loiselle, RVT 08/01/2012, 3:50 PM

## 2012-08-02 ENCOUNTER — Encounter (HOSPITAL_COMMUNITY)
Admission: EM | Disposition: A | Discharge status: Skilled Nursing Facility | Payer: Self-pay | Source: Home / Self Care | Attending: Internal Medicine

## 2012-08-02 ENCOUNTER — Inpatient Hospital Stay (HOSPITAL_COMMUNITY): Payer: Medicare Other | Admitting: Anesthesiology

## 2012-08-02 ENCOUNTER — Encounter (HOSPITAL_COMMUNITY): Payer: Self-pay | Admitting: Critical Care Medicine

## 2012-08-02 ENCOUNTER — Encounter (HOSPITAL_COMMUNITY): Payer: Self-pay | Admitting: Anesthesiology

## 2012-08-02 DIAGNOSIS — Z8673 Personal history of transient ischemic attack (TIA), and cerebral infarction without residual deficits: Secondary | ICD-10-CM

## 2012-08-02 HISTORY — PX: AMPUTATION: SHX166

## 2012-08-02 LAB — CBC
MCH: 27.5 pg (ref 26.0–34.0)
MCV: 80.3 fL (ref 78.0–100.0)
Platelets: 331 10*3/uL (ref 150–400)
RDW: 15 % (ref 11.5–15.5)
WBC: 27 10*3/uL — ABNORMAL HIGH (ref 4.0–10.5)

## 2012-08-02 LAB — URINE CULTURE

## 2012-08-02 LAB — CULTURE, BLOOD (ROUTINE X 2)
Culture: NO GROWTH
Culture: NO GROWTH

## 2012-08-02 LAB — GLUCOSE, CAPILLARY
Glucose-Capillary: 91 mg/dL (ref 70–99)
Glucose-Capillary: 92 mg/dL (ref 70–99)
Glucose-Capillary: 191 mg/dL — ABNORMAL HIGH (ref 70–99)

## 2012-08-02 LAB — BASIC METABOLIC PANEL
CO2: 22 mEq/L (ref 19–32)
Calcium: 8.2 mg/dL — ABNORMAL LOW (ref 8.4–10.5)
Creatinine, Ser: 1.25 mg/dL (ref 0.50–1.35)

## 2012-08-02 SURGERY — AMPUTATION, FOOT, PARTIAL
Anesthesia: General | Site: Foot | Laterality: Right | Wound class: Dirty or Infected

## 2012-08-02 SURGERY — AMPUTATION, FOOT, PARTIAL
Anesthesia: Choice | Site: Foot | Laterality: Right | Wound class: Clean

## 2012-08-02 MED ORDER — PROPOFOL 10 MG/ML IV BOLUS
INTRAVENOUS | Status: DC | PRN
  Administered 2012-08-02: 150 mg via INTRAVENOUS

## 2012-08-02 MED ORDER — MIDAZOLAM HCL 2 MG/2ML IJ SOLN: 0.5000 mg | Freq: Once | INTRAMUSCULAR | Status: DC | PRN

## 2012-08-02 MED ORDER — OXYCODONE HCL 5 MG/5ML PO SOLN: 5.0000 mg | Freq: Once | ORAL | Status: DC | PRN

## 2012-08-02 MED ORDER — MEPERIDINE HCL 25 MG/ML IJ SOLN: 6.2500 mg | INTRAMUSCULAR | Status: DC | PRN

## 2012-08-02 MED ORDER — LIDOCAINE HCL (PF) 1 % IJ SOLN
INTRAMUSCULAR | Status: AC
  Filled 2012-08-02: qty 30

## 2012-08-02 MED ORDER — FENTANYL CITRATE 0.05 MG/ML IJ SOLN
INTRAMUSCULAR | Status: DC | PRN
  Administered 2012-08-02 (×6): 25 ug via INTRAVENOUS

## 2012-08-02 MED ORDER — OXYCODONE HCL 5 MG PO TABS: 5.0000 mg | ORAL_TABLET | Freq: Once | ORAL | Status: DC | PRN

## 2012-08-02 MED ORDER — ONDANSETRON HCL 4 MG/2ML IJ SOLN
INTRAMUSCULAR | Status: DC | PRN
  Administered 2012-08-02: 4 mg via INTRAVENOUS

## 2012-08-02 MED ORDER — BUPIVACAINE HCL (PF) 0.5 % IJ SOLN
INTRAMUSCULAR | Status: AC
  Filled 2012-08-02: qty 30

## 2012-08-02 MED ORDER — HYDROMORPHONE HCL PF 1 MG/ML IJ SOLN: 0.2500 mg | INTRAMUSCULAR | Status: DC | PRN

## 2012-08-02 MED ORDER — 0.9 % SODIUM CHLORIDE (POUR BTL) OPTIME
TOPICAL | Status: DC | PRN
  Administered 2012-08-02: 1000 mL

## 2012-08-02 MED ORDER — MIDAZOLAM HCL 5 MG/5ML IJ SOLN
INTRAMUSCULAR | Status: DC | PRN
  Administered 2012-08-02: 1 mg via INTRAVENOUS

## 2012-08-02 MED ORDER — PROMETHAZINE HCL 25 MG/ML IJ SOLN: 6.2500 mg | INTRAMUSCULAR | Status: DC | PRN

## 2012-08-02 MED ORDER — LACTATED RINGERS IV SOLN
INTRAVENOUS | Status: DC | PRN
  Administered 2012-08-02: 11:00:00 via INTRAVENOUS

## 2012-08-02 MED ORDER — EPHEDRINE SULFATE 50 MG/ML IJ SOLN
INTRAMUSCULAR | Status: DC | PRN
  Administered 2012-08-02 (×2): 5 mg via INTRAVENOUS

## 2012-08-02 MED ORDER — LIDOCAINE HCL (CARDIAC) 20 MG/ML IV SOLN
INTRAVENOUS | Status: DC | PRN
  Administered 2012-08-02: 20 mg via INTRAVENOUS

## 2012-08-02 SURGICAL SUPPLY — 53 items
BANDAGE ELASTIC 3 VELCRO ST LF (GAUZE/BANDAGES/DRESSINGS) IMPLANT
BANDAGE ELASTIC 4 VELCRO ST LF (GAUZE/BANDAGES/DRESSINGS) ×2 IMPLANT
BANDAGE GAUZE ELAST BULKY 4 IN (GAUZE/BANDAGES/DRESSINGS) ×2 IMPLANT
BARD CWS 400 CLOSED WOUND SUCTION KIT ×2 IMPLANT
BLADE OSC/SAG .038X5.5 CUT EDG (BLADE) ×2 IMPLANT
BLADE OSCILLATING/SAGITTAL (BLADE) ×1
BLADE SURG 10 STRL SS (BLADE) ×6 IMPLANT
BLADE SW THK.38XMED NAR THN (BLADE) ×1 IMPLANT
BNDG ESMARK 4X9 LF (GAUZE/BANDAGES/DRESSINGS) IMPLANT
CLOTH BEACON ORANGE TIMEOUT ST (SAFETY) ×2 IMPLANT
COVER TABLE BACK 60X90 (DRAPES) ×2 IMPLANT
CUFF TOURNIQUET SINGLE 18IN (TOURNIQUET CUFF) IMPLANT
CUFF TOURNIQUET SINGLE 34IN LL (TOURNIQUET CUFF) IMPLANT
DECANTER SPIKE VIAL GLASS SM (MISCELLANEOUS) IMPLANT
DRAPE OEC MINIVIEW 54X84 (DRAPES) IMPLANT
DRAPE SURG 17X23 STRL (DRAPES) IMPLANT
DRAPE U-SHAPE 47X51 STRL (DRAPES) IMPLANT
DURAPREP 26ML APPLICATOR (WOUND CARE) ×2 IMPLANT
ELECT REM PT RETURN 9FT ADLT (ELECTROSURGICAL) ×2
ELECTRODE REM PT RTRN 9FT ADLT (ELECTROSURGICAL) ×1 IMPLANT
GAUZE XEROFORM 1X8 LF (GAUZE/BANDAGES/DRESSINGS) ×2 IMPLANT
GLOVE BIO SURGEON STRL SZ7 (GLOVE) ×2 IMPLANT
GLOVE BIO SURGEON STRL SZ7.5 (GLOVE) ×2 IMPLANT
GLOVE BIOGEL PI IND STRL 6.5 (GLOVE) ×2 IMPLANT
GLOVE BIOGEL PI IND STRL 7.0 (GLOVE) ×2 IMPLANT
GLOVE BIOGEL PI IND STRL 8 (GLOVE) ×1 IMPLANT
GLOVE BIOGEL PI INDICATOR 6.5 (GLOVE) ×2
GLOVE BIOGEL PI INDICATOR 7.0 (GLOVE) ×2
GLOVE BIOGEL PI INDICATOR 8 (GLOVE) ×1
GLOVE ECLIPSE 7.0 STRL STRAW (GLOVE) ×2 IMPLANT
GOWN PREVENTION PLUS XLARGE (GOWN DISPOSABLE) ×2 IMPLANT
KIT BASIN OR (CUSTOM PROCEDURE TRAY) ×2 IMPLANT
NEEDLE HYPO 22GX1.5 SAFETY (NEEDLE) IMPLANT
NS IRRIG 1000ML POUR BTL (IV SOLUTION) ×2 IMPLANT
PACK ORTHO EXTREMITY (CUSTOM PROCEDURE TRAY) ×2 IMPLANT
PAD CAST 4YDX4 CTTN HI CHSV (CAST SUPPLIES) ×1 IMPLANT
PADDING CAST ABS 4INX4YD NS (CAST SUPPLIES)
PADDING CAST ABS COTTON 4X4 ST (CAST SUPPLIES) IMPLANT
PADDING CAST COTTON 4X4 STRL (CAST SUPPLIES) ×2
SPONGE GAUZE 4X4 12PLY (GAUZE/BANDAGES/DRESSINGS) ×2 IMPLANT
SPONGE LAP 18X18 X RAY DECT (DISPOSABLE) IMPLANT
SPONGE LAP 4X18 X RAY DECT (DISPOSABLE) IMPLANT
SUT ETHILON 2 0 FS 18 (SUTURE) ×4 IMPLANT
SUT ETHILON 3 0 PS 1 (SUTURE) ×2 IMPLANT
SUT ETHILON 4 0 PS 2 18 (SUTURE) ×2 IMPLANT
SUT VIC AB 2-0 SH 27 (SUTURE) ×2
SUT VIC AB 2-0 SH 27XBRD (SUTURE) ×1 IMPLANT
SYR BULB IRRIGATION 50ML (SYRINGE) ×2 IMPLANT
SYR CONTROL 10ML LL (SYRINGE) IMPLANT
TOWEL OR 17X24 6PK STRL BLUE (TOWEL DISPOSABLE) ×2 IMPLANT
TUBE CONNECTING 12X1/4 (SUCTIONS) ×2 IMPLANT
UNDERPAD 30X30 INCONTINENT (UNDERPADS AND DIAPERS) ×2 IMPLANT
YANKAUER SUCT BULB TIP NO VENT (SUCTIONS) ×2 IMPLANT

## 2012-08-02 NOTE — Op Note (Signed)
Pre Op Dx: Dry gangrene of the right foot including second and fourth toes foot pad, status post diabetic ketoacidosis.  Post Op Dx: Same, with dysvascular tissue extending all the way to the mid foot across all of the metatarsals.  Procedure: Right foot transmetatarsal amputation  Surgeon: Kerin Salen, MD  Assistant: Leafy Kindle PA-C  Anesthesia: General  EBL: Minimal  Fluids: 800 cc of crystalloid  Tourniquet Time: None  Indications: Admitted last week with severe diabetic ketoacidosis with a glucose of 600 a high lactic acid and obvious necrosis of the right third toe.MRI scan was consistent with osteo-the distal phalanx of the third toe and 4 days ago the patient underwent right third toe amputation for necrosis of the third toe at the MTP joint going distally. The status of the second and fourth toes was questionable but they were not obviously infected at that time it was felt that we will allow the patient's ketoacidosis to continue to resolve and for the area of necrosis to outline itself. Over the last few days the second and fourth OB, modified half of the great toes also be, modified and the only tether seen to have any blood supply was the fifth toe. There was little if any drainage from the open wound and it should be noted that at the first surgery the patient had essentially no bleeding secondary to poor blood supply. He is had very little if any pain in the foot, but his white cell count is been at 23,000 although was 33,000 when he came in. He is taken for a minimum of second and fourth toe MTP and dictation is however we intend to cut back to bleeding tissue which may take [place] all way up to the midfoot. The foot pad over the metatarsal heads is also dusky.  Procedure: Patient identified by arm band and taken to the operating room at [facility] appropriate anesthetic monitors were attached and are when general LMA anesthesia.we did not use a tourniquet, the right  foot was then prepped and draped in usual sterile fashion from the ankle to the toes. Timeout procedure was performed. We began by making a fishmouth incision that included the MTP joints of the second and fourth MTP joints. This removed a black necrotic tissue and left [place] with an open wound with obvious necrosis extending up towards the midfoot and no bleeding. At this point I went ahead and excise the mummified great toe at the MTP joint and the small toe at the MTP joint. There is purulent material extending up to the midfoot at this point the fishmouth incision incision was extended up to the mid metatarsals and necrotic tissue was peeled off the distal aspect of the metatarsals back to tissue that did have a blood supply. Using a small anterior cruciate ligament saw we then performed transmetatarsal amputation 1 through 5 and continued sharp debridement removing necrotic tissue at the midfoot level and including tendons, fascia and sub-subcutaneous tissue. The dorsal and plantar flaps were also trimmed back to allow a loose closure over the metatarsal heads. The wound is thoroughly irrigated with normal saline solution alternating with hydrogen peroxide. A small Hemovac drain was placed in the wound and then we loosely closed the fishmouth incision with 2-0 nylon suture. A dressing of 4 x 4's and Kerlix was then applied the patient was awakened extubated and taken to the recovery without difficulty. Based on the condition of the tissues at the midfoot the dorsal and plantar  flaps may or may not survive, if they do not, he is a candidate for a BKA amputation.

## 2012-08-02 NOTE — Progress Notes (Signed)
Report given to Elise RN

## 2012-08-02 NOTE — Care Management Note (Unsigned)
    Page 1 of 2   08/10/2012     4:15:06 PM   CARE MANAGEMENT NOTE 08/10/2012  Patient:  [name],[name]   Account Number:  [id number]  Date Initiated:  07/28/2012  Documentation initiated by:  Felix Ahmadi Assessment:   62 yr-old male adm with dx of DKA; lives alone, independent PTA     Action/Plan:   Anticipated DC Date:  08/11/2012   Anticipated DC Plan:  SKILLED NURSING FACILITY  In-house referral  Clinical Social Worker      DC Planning Services  CM consult      Choice offered to / List presented to:             Status of service:  In process, will continue to follow Medicare Important Message given?   (If response is "NO", the following Medicare IM given date fields will be blank) Date Medicare IM given:   Date Additional Medicare IM given:    Discharge Disposition:    Per UR Regulation:  Reviewed for med. necessity/level of care/duration of stay  If discussed at [place] of Stay Meetings, dates discussed:   08/10/2012    Comments:    08/02/12 Lerone Onder,RN,BSN [id number] PT FOR FURTHER TOE AMPUTATIONS TODAY IN O.R.  PT/OT RECOMMENDING SNF, AS PT LIVES ALONE.  WILL CONSULT CSW TO FACILITATE POSS DC TO SNF FOR REHAB AT DC.  WILL FOLLOW PROGRESS.  07-31-12 Asked to see patient again regarding medication assistance and PCP . Confirmed he does have prescription coverage . He does not quality for medication assistance . Discussed PCP , explianed he can call the number on his insurance card for list of PCP's in his are accepting new patient's in network .  Magdalen Spatz RN BSN    07/28/12 1418 [place] RN MSN BSN CCM Received referral to assist with medications and PCP f/u. Pt does not qualify for medication assistance because he has insurance, will not qualify for Medicaid as his income is over the limit.  Will assist with pharmaceutical patient assistance programs based on d/c meds but informed pt he will need PCP.  Provided pt with list of  low-cost and free clinics, offered to call to ensure his insurance would be accepted -  pt does not want to discuss finding PCP@ present.

## 2012-08-02 NOTE — Progress Notes (Signed)
[facility] PROGRESS NOTE  [name] [id number] DOB: 01-Apr-1950 DOA: 07/27/2012 PCP: No PCP Per Patient  Brief narrative:  62 year old male with history of diabetes mellitus ( was on oral hypoglycemics until one year back and stopped taking his meds), CAD status post CABG X4 in 2008 history of CVA, history of tobacco and alcohol use presented to the ED after a mechanical fall at home and hurt his neck. Also complained of pain over his right toe that he has noticed since 3 days.patient noted to have gangrene of his right 3rd toe with sepsis and DKA. admitted to stepdown and transferred to telemetry .   Assessment/Plan:   Severe sepsis  marked leucocytosis, elevated lactate secondary to gangrenous third right toe. X ray shows osteomyelitis. MRI rt foot shows osteomyelitis of rt third toe with cellulitis of foot.  Empirically covering with IV vanco and zosyn. Blood cx so far no growth .  Appreciate ortho recommendations. Third Toe amputation done on 5/24. However he still has fever and other toe appear necrotic. ABI ordered and per ortho . <0.9 in both legs.  he will likely need amputation of other toes and debridement of  necrotic tissue to the plantar aspect of his right foot today.  Pain control   repeat blood cx CXR negative. Pending urine cx  DKA (diabetic ketoacidoses)  In the setting of sepsis and severe non compliance  Started on insulin drip on admission.AG closed and placed on levemir. A1C of 10.9.  Increased levemir dose and add premeal aspart  CBG (last 3)   Recent Labs  08/01/12 2105 08/02/12 0637 08/02/12 1224  GLUCAP 110* 91 92      .   Active Problems:  H/O: CVA (cerebrovascular accident)  No residual weakness. continue ASA   Hypertension  Not on any BP meds. On perioperative BB.   CAD S/P CABG x 4 in 2008  informs seeing Dr Terrence Dupont infrequently. Discussed with him on 5/22. No further recommendations   Anemia  Low MCV noted on iron panel. No  recent blood work. Drop in H&H noted. No gross Gi bleed. Stool for occult blood pending. 2U PRBC given on the day of surgery. Hb of 8 again. Will order 2 U prbc.  Tobacco abuse/ Alcohol abuse  Reports to have quit   Low urine output on 5/27 Check bladder scan with almost 400 cc residue. Ordered foley. I/O last 3 completed shifts: In: [id number] [P.O.:720; Blood:650; IV Piggyback:300] Out: 850 [Urine:850]       DVT prophylaxis  Sq lovenox   Diet: Diabetic   Code Status:full  Family Communication:sister at bedside  Disposition Plan: possible further amputation of toes.    Consultants:  Ortho ( Dr Berenice Primas)  Procedures:  Rt third toe MTP amputation on 5/24 .   Antibiotics:  IV vnaco / zosyn: 5/22>>   HPI/Subjective:  Reports pain in the toes. Is awaiting surgery today.  Objective: Filed Vitals:   08/02/12 0242 08/02/12 0321 08/02/12 0426 08/02/12 0525  BP: 131/68 127/68 142/67 129/69  Pulse: 68 71 69 72  Temp: 98.4 F (36.9 C) 98 F (36.7 C) 97.7 F (36.5 C) 97.7 F (36.5 C)  TempSrc: Oral Oral Oral Oral  Resp: 18 20 20 20   Height:      Weight:      SpO2:    98%    Intake/Output Summary (Last 24 hours) at 08/02/12 0748 Last data filed at 08/02/12 0549  Gross per 24 hour  Intake   1670 ml  Output    100 ml  Net   1570 ml   Filed Weights   07/28/12 0600 07/29/12 0036 07/31/12 0411  Weight: 82.2 kg (181 lb 3.5 oz) 84 kg (185 lb 3 oz) 88.9 kg (195 lb 15.8 oz)    Exam: General: middle aged male in NAD  HEENT: no pallor, moist mucosa  Cardiovascular: S1&S2 normal, 3/6 systolic murmur  Respiratory: clear b/l, no added sounds  Abdomen: soft, NT, ND, BS+  Musculoskeletal:right foot dressing. Trace edema over right foot extending to distal tibia . Other toes appears necrotic as well CNS: AAOX3      Data Reviewed: Basic Metabolic Panel:  Recent Labs Lab 07/27/12 1631 07/27/12 2106 07/27/12 2303 07/28/12 0058 07/28/12 0257  NA 126* 132* 134* 134* 135   K 4.4 5.0 3.6 3.6 3.7  CL 82* 93* 95* 96 97  CO2 14* 21 25 26 26   GLUCOSE 669* 370* 330* 264* 158*  BUN 42* 36* 36* 35* 35*  CREATININE 1.35 1.13 1.16 1.20 1.23  CALCIUM 9.6 9.4 9.1 9.2 9.3   Liver Function Tests:  Recent Labs Lab 07/27/12 1631  AST 21  ALT 14  ALKPHOS 149*  BILITOT 0.7  PROT 8.3  ALBUMIN 2.2*   No results found for this basename: LIPASE, AMYLASE,  in the last 168 hours No results found for this basename: AMMONIA,  in the last 168 hours CBC:  Recent Labs Lab 07/27/12 1631  07/28/12 0257 07/29/12 0849 07/30/12 0440 07/31/12 0400 08/01/12 0520  WBC 29.1*  < > 24.8* 25.3* 21.3* 25.8* 23.3*  NEUTROABS 24.8*  --   --   --   --   --   --   HGB 8.4*  < > 8.2* 6.5* 10.7* 9.1* 8.2*  HCT 24.7*  < > 23.5* 18.8* 29.8* 25.7* 24.0*  MCV 78.7  < > 77.0* 77.0* 77.0* 76.7* 77.9*  PLT 376  < > 367 371 339 332 333  < > = values in this interval not displayed. Cardiac Enzymes: No results found for this basename: CKTOTAL, CKMB, CKMBINDEX, TROPONINI,  in the last 168 hours BNP (last 3 results) No results found for this basename: PROBNP,  in the last 8760 hours CBG:  Recent Labs Lab 08/01/12 0555 08/01/12 1112 08/01/12 1613 08/01/12 2105 08/02/12 0637  GLUCAP 151* 177* 146* 110* 91    Recent Results (from the past 240 hour(s))  CULTURE, BLOOD (ROUTINE X 2)     Status: None   Collection Time    07/27/12  4:40 PM      Result Value Range Status   Specimen Description BLOOD LEFT ANTECUBITAL   Final   Special Requests BOTTLES DRAWN AEROBIC ONLY 4CC   Final   Culture  Setup Time 07/27/2012 21:39   Final   Culture     Final   Value:        BLOOD CULTURE RECEIVED NO GROWTH TO DATE CULTURE WILL BE HELD FOR 5 DAYS BEFORE ISSUING A FINAL NEGATIVE REPORT   Report Status PENDING   Incomplete  CULTURE, BLOOD (ROUTINE X 2)     Status: None   Collection Time    07/27/12  4:50 PM      Result Value Range Status   Specimen Description BLOOD RIGHT HAND   Final   Special  Requests BOTTLES DRAWN AEROBIC ONLY [place]   Final   Culture  Setup Time 07/27/2012 21:38   Final   Culture     Final   Value:  BLOOD CULTURE RECEIVED NO GROWTH TO DATE CULTURE WILL BE HELD FOR 5 DAYS BEFORE ISSUING A FINAL NEGATIVE REPORT   Report Status PENDING   Incomplete  URINE CULTURE     Status: None   Collection Time    07/27/12  5:10 PM      Result Value Range Status   Specimen Description URINE, CLEAN CATCH   Final   Special Requests NONE   Final   Culture  Setup Time 07/27/2012 21:57   Final   Colony Count NO GROWTH   Final   Culture NO GROWTH   Final   Report Status 07/28/2012 FINAL   Final  MRSA PCR SCREENING     Status: None   Collection Time    07/27/12  9:17 PM      Result Value Range Status   MRSA by PCR NEGATIVE  NEGATIVE Final   Comment:            The GeneXpert MRSA Assay (FDA     approved for NASAL specimens     only), is one component of a     comprehensive MRSA colonization     surveillance program. It is not     intended to diagnose MRSA     infection nor to guide or     monitor treatment for     MRSA infections.  CULTURE, BLOOD (ROUTINE X 2)     Status: None   Collection Time    07/31/12  2:37 PM      Result Value Range Status   Specimen Description BLOOD RIGHT ARM   Final   Special Requests BOTTLES DRAWN AEROBIC ONLY 10CC   Final   Culture  Setup Time 07/31/2012 23:11   Final   Culture     Final   Value:        BLOOD CULTURE RECEIVED NO GROWTH TO DATE CULTURE WILL BE HELD FOR 5 DAYS BEFORE ISSUING A FINAL NEGATIVE REPORT   Report Status PENDING   Incomplete  CULTURE, BLOOD (ROUTINE X 2)     Status: None   Collection Time    07/31/12  3:15 PM      Result Value Range Status   Specimen Description BLOOD RIGHT ARM   Final   Special Requests BOTTLES DRAWN AEROBIC AND ANAEROBIC 10CC   Final   Culture  Setup Time 07/31/2012 23:12   Final   Culture     Final   Value:        BLOOD CULTURE RECEIVED NO GROWTH TO DATE CULTURE WILL BE HELD FOR 5 DAYS  BEFORE ISSUING A FINAL NEGATIVE REPORT   Report Status PENDING   Incomplete     Studies: Dg Chest Port 1 View  07/31/2012   *RADIOLOGY REPORT*  Clinical Data: Cough and fever, evaluate for pneumonia  PORTABLE CHEST - 1 VIEW  Comparison: 07/27/2012; 07/20/2012; 01/27/1999  Findings: Grossly unchanged cardiac silhouette and mediastinal contours given reduced lung volumes and patient rotation.  Post median sternotomy and CABG.  There is persistent mild elevation of the right hemidiaphragm.  No focal airspace opacities.  No pleural effusion or pneumothorax.  No definite evidence of edema. Unchanged bones.  IMPRESSION: No definite acute cardiopulmonary disease on this low lung volume AP portable exam. Further evaluation with a PA and lateral chest radiograph may be obtained as clinically indicated.   Original Report Authenticated By: Jake Seats, MD    Scheduled Meds: . aspirin  81 mg Oral Daily  . enoxaparin (  LOVENOX) injection  40 mg Subcutaneous Q24H  . famotidine  10 mg Oral BID  . insulin aspart  0-15 Units Subcutaneous TID WC  . insulin aspart  3 Units Subcutaneous TID WC  . insulin detemir  25 Units Subcutaneous Q24H  . metoprolol tartrate  25 mg Oral BID  . piperacillin-tazobactam (ZOSYN)  IV  3.375 g Intravenous Q8H  . polyethylene glycol  17 g Oral Daily  . sodium chloride  3 mL Intravenous Q12H  . vancomycin  1,000 mg Intravenous Q12H   Continuous Infusions: . sodium chloride 20 mL/hr at 07/31/12 1343      Time spent:35 minutes    Osric Klopf  [facility] Pager [phone]. If 7PM-7AM, please contact night-coverage at [email or web address], password [facility] 08/02/2012, 7:48 AM  LOS: 6 days

## 2012-08-02 NOTE — Progress Notes (Signed)
Patient ID: [name], male   DOB: 1950-04-25, 62 y.o.   MRN: [id number] Patient seen in preop area for amputation of gangrenous second and fourth toes as well as soft tissue from the right forefoot. Status post amputation of third toe at the MTP joint back on Saturday, with progressive necrosis of the second and fourth toe as well as part of the foot pad. Patient is now out of the intensive care unit on telemetry, has received 4 units of packed red cells since I met him on Saturday, still has a white cell count of 23,000, urine output in the last 24 hours has been 100 cc.. When we did the amputation Saturday there was no bleeding from the tissue although we cut back to tissue that appeared to be viable. Postoperatively patient had some chest pain has been on telemetry ever since and stable, but a stress test that was ordered by Dr. Sarina Ser has not been a cholecystectomy. It is my opinion that the amputation of the gangrenous tissue is urgent and needs to be done without delay.

## 2012-08-02 NOTE — Anesthesia Postprocedure Evaluation (Signed)
  Anesthesia Post-op Note  Patient: [name]  Procedure(s) Performed: Procedure(s): right transmetatarsal amputation (Right)  Patient Location: PACU  Anesthesia Type:General  Level of Consciousness: awake, alert , oriented and patient cooperative  Airway and Oxygen Therapy: Patient Spontanous Breathing and Patient connected to nasal cannula oxygen  Post-op Pain: none  Post-op Assessment: Post-op Vital signs reviewed, Patient's Cardiovascular Status Stable, Respiratory Function Stable, Patent Airway, No signs of Nausea or vomiting and Pain level controlled  Post-op Vital Signs: Reviewed and stable  Complications: No apparent anesthesia complications

## 2012-08-02 NOTE — Transfer of Care (Signed)
Immediate Anesthesia Transfer of Care Note  Patient: [name]  Procedure(s) Performed: Procedure(s): right transmetatarsal amputation (Right)  Patient Location: PACU  Anesthesia Type:General  Level of Consciousness: awake, alert  and oriented  Airway & Oxygen Therapy: Patient Spontanous Breathing and Patient connected to nasal cannula oxygen  Post-op Assessment: Report given to PACU RN, Post -op Vital signs reviewed and stable and Patient moving all extremities X 4  Post vital signs: Reviewed and stable  Complications: No apparent anesthesia complications

## 2012-08-02 NOTE — Anesthesia Preprocedure Evaluation (Addendum)
Anesthesia Evaluation  Patient identified by MRN, date of birth, ID band Patient awake    Reviewed: Allergy & Precautions, H&P , NPO status , Patient's Chart, lab work & pertinent test results  History of Anesthesia Complications Negative for: history of anesthetic complications  Airway Mallampati: II TM Distance: >3 FB Neck ROM: Full    Dental  (+) Dental Advisory Given and Missing   Pulmonary Current Smoker,  breath sounds clear to auscultation  Pulmonary exam normal       Cardiovascular hypertension, Pt. on medications and Pt. on home beta blockers + CAD, + CABG and + Peripheral Vascular Disease (s/p toe amp) Rhythm:Regular Rate:Normal  Last stress test '09: EF 41%, no reversible ischemia   Neuro/Psych CVA    GI/Hepatic negative GI ROS, Neg liver ROS,   Endo/Other  diabetes (glu 91), Poorly Controlled, Type 2  Renal/GU negative Renal ROS     Musculoskeletal   Abdominal   Peds  Hematology  (+) Blood dyscrasia (Hb 10.2 s/p transfusion), anemia ,   Anesthesia Other Findings   Reproductive/Obstetrics                         Anesthesia Physical Anesthesia Plan  ASA: III  Anesthesia Plan: General   Post-op Pain Management:    Induction: Intravenous  Airway Management Planned: LMA  Additional Equipment:   Intra-op Plan:   Post-operative Plan:   Informed Consent: I have reviewed the patients History and Physical, chart, labs and discussed the procedure including the risks, benefits and alternatives for the proposed anesthesia with the patient or authorized representative who has indicated his/her understanding and acceptance.   Dental advisory given  Plan Discussed with: Anesthesiologist, Surgeon and CRNA  Anesthesia Plan Comments: (Plan routine monitors, GA- LMA OK  Leg cellulitic)       Anesthesia Quick Evaluation

## 2012-08-02 NOTE — Progress Notes (Addendum)
Patient ID: [name], male   DOB: 1950/05/20, 62 y.o.   MRN: [id number] Subjective: Patient resting comfortably in bed with minimal pain. Planned amputation of necrotic toes today, patient received packed red blood cells last night for hemoglobin of 8.2, urine output in the last 24 hours is measured at 100 cc. Capillary glucose measurements are now down well around 100.  Objective: The second toe is necrotic all the way down to the MTP joint and is becoming mummified. The fourth toes also mummified down to the IP joint and has no sensation. The great toe has active motion and sensation out to near the tip in the fifth toe has a normal appearance. The foot pad is sensation out to the level of the MTP joint. Arterial brachial indexes were accomplished and were 0.60 on the right and 0.68 on the left consistent with moderate peripheral artery disease.  Assessment: The patient has dry gangrene of the second and fourth toe as well as the distal part of the foot pad. There is little if any drainage probably because there's little if any blood supply to these areas. There is no active swelling or cellulitis on the mid foot or heel pad.  Plan: He'll be taken for second fourth toe MTP amputation's and removal of necrotic tissue from the foot pad today if necessary will also take metatarsal heads so there's coverage over the bone. I will defer to internal medicine regarding the management of his low urine output. Regarding his anemia, I do not believe is related to surgery, because there was no bleeding at all when we did the third toe amputation.

## 2012-08-02 NOTE — Progress Notes (Addendum)
ANTIBIOTIC CONSULT NOTE -follow up  Pharmacy Consult for Vancomycin/zosyn Indication: gangrene in rt third toe  No Known Allergies  Patient Measurements: Height: 5\' 11"  (180.3 cm) Weight: 195 lb 15.8 oz (88.9 kg) IBW/kg (Calculated) : 75.3   Vital Signs: Temp: 98.1 F (36.7 C) (05/28 1315) Temp src: Oral (05/28 0954) BP: 144/65 mmHg (05/28 1315) Pulse Rate: 84 (05/28 1315) Intake/Output from previous day: 05/27 0701 - 05/28 0700 In: 1670 [P.O.:720; Blood:650; IV Piggyback:300] Out: 100 [Urine:100] Intake/Output from this shift: Total I/O In: 500 [I.V.:500] Out: 1485 [Urine:1475; Blood:10]  Labs:  Recent Labs  07/31/12 0400 08/01/12 0520 08/02/12 0852  WBC 25.8* 23.3* 27.0*  HGB 9.1* 8.2* 10.2*  PLT 332 333 331  CREATININE  --   --  1.25   Estimated Creatinine Clearance: 65.3 ml/min (by C-G formula based on Cr of 1.25). No results found for this basename: VANCOTROUGH, VANCOPEAK, VANCORANDOM, [place], GENTPEAK, [place], [place], [place], TOBRARND, AMIKACINPEAK, AMIKACINTROU, AMIKACIN,  in the last 72 hours   Microbiology: Recent Results (from the past 720 hour(s))  CULTURE, BLOOD (ROUTINE X 2)     Status: None   Collection Time    07/27/12  4:40 PM      Result Value Range Status   Specimen Description BLOOD LEFT ANTECUBITAL   Final   Special Requests BOTTLES DRAWN AEROBIC ONLY 4CC   Final   Culture  Setup Time 07/27/2012 21:39   Final   Culture NO GROWTH 5 DAYS   Final   Report Status 08/02/2012 FINAL   Final  CULTURE, BLOOD (ROUTINE X 2)     Status: None   Collection Time    07/27/12  4:50 PM      Result Value Range Status   Specimen Description BLOOD RIGHT HAND   Final   Special Requests BOTTLES DRAWN AEROBIC ONLY [place]   Final   Culture  Setup Time 07/27/2012 21:38   Final   Culture NO GROWTH 5 DAYS   Final   Report Status 08/02/2012 FINAL   Final  URINE CULTURE     Status: None   Collection Time    07/27/12  5:10 PM      Result Value Range  Status   Specimen Description URINE, CLEAN CATCH   Final   Special Requests NONE   Final   Culture  Setup Time 07/27/2012 21:57   Final   Colony Count NO GROWTH   Final   Culture NO GROWTH   Final   Report Status 07/28/2012 FINAL   Final  MRSA PCR SCREENING     Status: None   Collection Time    07/27/12  9:17 PM      Result Value Range Status   MRSA by PCR NEGATIVE  NEGATIVE Final   Comment:            The GeneXpert MRSA Assay (FDA     approved for NASAL specimens     only), is one component of a     comprehensive MRSA colonization     surveillance program. It is not     intended to diagnose MRSA     infection nor to guide or     monitor treatment for     MRSA infections.  CULTURE, BLOOD (ROUTINE X 2)     Status: None   Collection Time    07/31/12  2:37 PM      Result Value Range Status   Specimen Description BLOOD RIGHT ARM   Final  Special Requests BOTTLES DRAWN AEROBIC ONLY 10CC   Final   Culture  Setup Time 07/31/2012 23:11   Final   Culture     Final   Value:        BLOOD CULTURE RECEIVED NO GROWTH TO DATE CULTURE WILL BE HELD FOR 5 DAYS BEFORE ISSUING A FINAL NEGATIVE REPORT   Report Status PENDING   Incomplete  CULTURE, BLOOD (ROUTINE X 2)     Status: None   Collection Time    07/31/12  3:15 PM      Result Value Range Status   Specimen Description BLOOD RIGHT ARM   Final   Special Requests BOTTLES DRAWN AEROBIC AND ANAEROBIC 10CC   Final   Culture  Setup Time 07/31/2012 23:12   Final   Culture     Final   Value:        BLOOD CULTURE RECEIVED NO GROWTH TO DATE CULTURE WILL BE HELD FOR 5 DAYS BEFORE ISSUING A FINAL NEGATIVE REPORT   Report Status PENDING   Incomplete   Assessment: 62 yr old male arrived at ED after a fall at home. 3rd right toe was painful and discolored. He had stopped his DM meds a year ago (metformin and glipizide).  Blood glucose was >600. WBC 29K. Was started on an insulin drip, zosyn and vancomycin per pharmacy.    5/24:  Right third toe MTP  amputation with irrigation and debridement of soft tissues  5/25 - Vancomycin trough 16.9 on 1g IV q12h which is therapeutic  5/28 back to OR for amputation of R 2nd and 4th toes as well as soft tissue from R forefoot  WBC up to 27, tmax 101.2,  Creat 1.25.   Am vanc dose missed b/c pt in OR but got zosyn dose.  All cx negative.  Goal of Therapy:  Vancomycin trough level 15-20 mcg/ml  Plan:  Continue Vancomycin 1 Gm IV q12hrs. Rescheduled  2nd to OR 5/28. Continue Zosyn 3.375g IV q8h (infuse over 4 hours) F/u planned LOT Eudelia Bunch, Pharm.D. [id number] 08/02/2012 2:31 PM

## 2012-08-02 NOTE — Progress Notes (Signed)
Dr. Mayer Camel at bedside spoke with patient above surgery and amputation of toes

## 2012-08-02 NOTE — Preoperative (Signed)
Beta Blockers   Reason not to administer Beta Blockers:Not Applicable 

## 2012-08-03 DIAGNOSIS — F1010 Alcohol abuse, uncomplicated: Secondary | ICD-10-CM

## 2012-08-03 LAB — TYPE AND SCREEN
ABO/RH(D): B POS
Antibody Screen: NEGATIVE
Unit division: 0

## 2012-08-03 LAB — BASIC METABOLIC PANEL
BUN: 24 mg/dL — ABNORMAL HIGH (ref 6–23)
CO2: 22 mEq/L (ref 19–32)
Calcium: 8.1 mg/dL — ABNORMAL LOW (ref 8.4–10.5)
Glucose, Bld: 190 mg/dL — ABNORMAL HIGH (ref 70–99)
Potassium: 4.1 mEq/L (ref 3.5–5.1)
Sodium: 127 mEq/L — ABNORMAL LOW (ref 135–145)

## 2012-08-03 LAB — GLUCOSE, CAPILLARY
Glucose-Capillary: 179 mg/dL — ABNORMAL HIGH (ref 70–99)
Glucose-Capillary: 163 mg/dL — ABNORMAL HIGH (ref 70–99)

## 2012-08-03 LAB — CBC
HCT: 27.3 % — ABNORMAL LOW (ref 39.0–52.0)
Hemoglobin: 9.5 g/dL — ABNORMAL LOW (ref 13.0–17.0)
MCH: 27.2 pg (ref 26.0–34.0)
RBC: 3.49 MIL/uL — ABNORMAL LOW (ref 4.22–5.81)

## 2012-08-03 MED ORDER — SODIUM CHLORIDE 0.9 % IV SOLN
INTRAVENOUS | Status: DC
  Administered 2012-08-03 – 2012-08-10 (×5): via INTRAVENOUS

## 2012-08-03 NOTE — Progress Notes (Signed)
Dressing changed to right foot. Cleansed foot and applied new dressing. Drain to right foot was lying in bed with the end not connected to the foot. Pt did not know when the drain had slipped off. Will page MD to make aware. Pt tolerated dressing change well. Marko Plume

## 2012-08-03 NOTE — Progress Notes (Signed)
Subjective: 1 Day Post-Op Procedure(s) (LRB): right transmetatarsal amputation (Right) Patient reports pain as 4 on 0-10 scale. Discussed the findings at surgery that included purulence extending up into the midfoot with necrotic tissue to the mid metatarsal level. Prepared the patient for possible below the knee amputation if the remaining skin flaps do not survive.   Objective: Vital signs in last 24 hours: Temp:  [98.1 F (36.7 C)-101.9 F (38.8 C)] 99.9 F (37.7 C) (05/29 0426) Pulse Rate:  [71-95] 79 (05/29 0426) Resp:  [12-18] 18 (05/29 0426) BP: (126-153)/(51-68) 142/62 mmHg (05/29 0426) SpO2:  [98 %-100 %] 98 % (05/29 0426)  Intake/Output from previous day: 05/28 0701 - 05/29 0700 In: 1460 [P.O.:960; I.V.:500] Out: 2885 [Urine:2875; Blood:10] Intake/Output this shift:     Recent Labs  08/01/12 0520 08/02/12 0852 08/03/12 0455  HGB 8.2* 10.2* 9.5*    Recent Labs  08/02/12 0852 08/03/12 0455  WBC 27.0* 25.7*  RBC 3.71* 3.49*  HCT 29.8* 27.3*  PLT 331 363    Recent Labs  08/02/12 0852 08/03/12 0455  NA 131* 127*  K 3.7 4.1  CL 97 93*  CO2 22 22  BUN 24* 24*  CREATININE 1.25 1.38*  GLUCOSE 92 190*  CALCIUM 8.2* 8.1*   No results found for this basename: LABPT, INR,  in the last 72 hours  The patient's actually had some bleeding through the dressing last night, this is actually good findings since up until now there is been no bleeding tissue to the forefoot. The drain is intact and has had minimal bloody output. I did take the dressing down and of the blistering dorsally and laterally appears stable and the patient says he has sensation when I palpate over the dorsum of the foot. At the mid foot the skin remains mildly mottled and dusky and that survival is in question. The suture line is intact and although the dressing was soaked there was no active bleeding on the dressing changed today. Assessment/Plan: 1 Day Post-Op Procedure(s) (LRB): right  transmetatarsal amputation (Right), I believe that when the patient came in with sepsis the infection had thrombosed small vessels throughout the foot, and her survival of the foot will be based on remaining blood supply and tissue viability. If there is further necrosis of the skin at the midfoot over the next few days we will proceed below the knee amputation, which will give the patient hives chance for function.  Plan: Continue drain for least 24 hours longer continue dressing changes for the next couple of days to see if the skin at the midfoot level survives. If not the patient is prepared for below the knee amputation.  Randalyn Ahmed J 08/03/2012, 7:51 AM

## 2012-08-03 NOTE — Progress Notes (Signed)
Nutrition Brief Follow Up Note  RD consulted for nutrition education regarding diabetes 5/23 ---> RD provided handouts to patient, however, unable to review/provide formal education at that time. This RD briefly reviewed DM diet guidelines; patient not very interested during visitation.  Lab Results  Component Value Date   HGBA1C 10.9* 07/28/2012    CBG (last 3)   Recent Labs  08/02/12 1656 08/02/12 2113 08/03/12 0613  GLUCAP 152* 191* 179*    Body mass index is 27.35 kg/(m^2). Patient meets criteria for Overweight based on current BMI.   Current diet order is Carbohydrate Modified High Calorie, patient is consuming approximately 100% of meals at this time. Labs and medications reviewed.   No further nutrition intervention warranted at this time.  If nutrition issues arise, please re-consult RD.   Arthur Holms, RD, LDN Pager #: [phone] After-Hours Pager #: [phone]

## 2012-08-03 NOTE — Progress Notes (Addendum)
[facility] PROGRESS NOTE  [name] [id number] DOB: [birth year] DOA: 07/27/2012 PCP: No PCP Per Patient  Brief narrative:  62 year old male with history of diabetes mellitus ( was on oral hypoglycemics until one year back and stopped taking his meds), CAD status post CABG X4 in 2008 history of CVA, history of tobacco and alcohol use presented to the ED after a mechanical fall at home and hurt his neck. Also complained of pain over his right toe that he has noticed since 3 days.patient noted to have gangrene of his right 3rd toe with sepsis and DKA. admitted to stepdown and transferred to telemetry .   Assessment/Plan:   Severe sepsis  marked leucocytosis, elevated lactate secondary to gangrenous third right toe. X ray shows osteomyelitis. MRI rt foot shows osteomyelitis of rt third toe with cellulitis of foot.  Empirically covering with IV vanco and zosyn. Blood cx so far no growth .  Appreciate ortho recommendations. Third Toe amputation done on 5/24. However he still has fever and other toe appear necrotic. ABI ordered and per ortho . <0.9 in both legs. He underwent right foot transmetatarsal amputation.  Recommend drain for 24 hour and to see if the skin at the mid foot level survives. , if there is necrosis of the skin in the next 48 hours , he will need BKA. Discussed with the patient.   Pain control   repeat blood cx CXR negative. Pending urine cx  DKA (diabetic ketoacidoses)  In the setting of sepsis and severe non compliance  Started on insulin drip on admission.AG closed and placed on levemir. A1C of 10.9.  Increased levemir dose and add premeal aspart  CBG (last 3)   Recent Labs  08/03/12 0613 08/03/12 1118 08/03/12 1610  GLUCAP 179* 163* 183*      .   Active Problems:  H/O: CVA (cerebrovascular accident)  No residual weakness. continue ASA   Hypertension  Not on any BP meds. On perioperative BB.   CAD S/P CABG x 4 in 2008  informs seeing Dr Terrence Dupont  infrequently. Discussed with him on 5/22. No further recommendations   Anemia  Low MCV noted on iron panel. No recent blood work. Drop in H&H noted. No gross Gi bleed. Stool for occult blood pending. 2U PRBC given on the day of surgery. Hb of 8 again. Will order 2 U prbc.  Tobacco abuse/ Alcohol abuse  Reports to have quit   Low urine output on 5/27 Check bladder scan with almost 400 cc residue. Ordered foley. I/O last 3 completed shifts: In: 2410 [P.O.:960; I.V.:500; Blood:650; IV Piggyback:300] Out: 2885 [Urine:2875; Blood:10] Total I/O In: 46 [P.O.:720] Out: 900 [Urine:900]  Hyponatremia and worsening of renal insufficiency: probably from dehydration. Wills tart him on IV normal saline and repeat labs in am.     DVT prophylaxis  Sq lovenox   Diet: Diabetic   Code Status:full  Family Communication:sister at bedside  Disposition Plan: possible further amputation of toes.    Consultants:  Ortho ( Dr Berenice Primas)  Procedures:  Rt third toe MTP amputation on 5/24 .   Antibiotics:  IV vnaco / zosyn: 5/22>>   HPI/Subjective:  Frustrated that he might need surgery int he future.   Objective: Filed Vitals:   08/02/12 2116 08/02/12 2210 08/03/12 0426 08/03/12 1345  BP: 139/66  142/62 148/58  Pulse: 95  79 75  Temp: 101.9 F (38.8 C) 99.2 F (37.3 C) 99.9 F (37.7 C) 98.8 F (37.1 C)  TempSrc: Oral Oral  Oral Oral  Resp: 18  18 18   Height:      Weight:      SpO2: 100%  98% 99%    Intake/Output Summary (Last 24 hours) at 08/03/12 1644 Last data filed at 08/03/12 1300  Gross per 24 hour  Intake   1440 ml  Output   2200 ml  Net   -760 ml   Filed Weights   07/28/12 0600 07/29/12 0036 07/31/12 0411  Weight: 82.2 kg (181 lb 3.5 oz) 84 kg (185 lb 3 oz) 88.9 kg (195 lb 15.8 oz)    Exam: General: middle aged male in NAD  HEENT: no pallor, moist mucosa  Cardiovascular: S1&S2 normal, 3/6 systolic murmur  Respiratory: clear b/l, no added sounds  Abdomen: soft, NT,  ND, BS+  Musculoskeletal:right foot dressing. Trace edema over right foot extending to distal tibia . Other toes appears necrotic as well CNS: AAOX3      Data Reviewed: Basic Metabolic Panel:  Recent Labs Lab 07/27/12 2303 07/28/12 0058 07/28/12 0257 08/02/12 0852 08/03/12 0455  NA 134* 134* 135 131* 127*  K 3.6 3.6 3.7 3.7 4.1  CL 95* 96 97 97 93*  CO2 25 26 26 22 22   GLUCOSE 330* 264* 158* 92 190*  BUN 36* 35* 35* 24* 24*  CREATININE 1.16 1.20 1.23 1.25 1.38*  CALCIUM 9.1 9.2 9.3 8.2* 8.1*   Liver Function Tests: No results found for this basename: AST, ALT, ALKPHOS, BILITOT, PROT, ALBUMIN,  in the last 168 hours No results found for this basename: LIPASE, AMYLASE,  in the last 168 hours No results found for this basename: AMMONIA,  in the last 168 hours CBC:  Recent Labs Lab 07/30/12 0440 07/31/12 0400 08/01/12 0520 08/02/12 0852 08/03/12 0455  WBC 21.3* 25.8* 23.3* 27.0* 25.7*  HGB 10.7* 9.1* 8.2* 10.2* 9.5*  HCT 29.8* 25.7* 24.0* 29.8* 27.3*  MCV 77.0* 76.7* 77.9* 80.3 78.2  PLT 339 332 333 331 363   Cardiac Enzymes: No results found for this basename: CKTOTAL, CKMB, CKMBINDEX, TROPONINI,  in the last 168 hours BNP (last 3 results) No results found for this basename: PROBNP,  in the last 8760 hours CBG:  Recent Labs Lab 08/02/12 1656 08/02/12 2113 08/03/12 0613 08/03/12 1118 08/03/12 1610  GLUCAP 152* 191* 179* 163* 183*    Recent Results (from the past 240 hour(s))  CULTURE, BLOOD (ROUTINE X 2)     Status: None   Collection Time    07/27/12  4:40 PM      Result Value Range Status   Specimen Description BLOOD LEFT ANTECUBITAL   Final   Special Requests BOTTLES DRAWN AEROBIC ONLY 4CC   Final   Culture  Setup Time 07/27/2012 21:39   Final   Culture NO GROWTH 5 DAYS   Final   Report Status 08/02/2012 FINAL   Final  CULTURE, BLOOD (ROUTINE X 2)     Status: None   Collection Time    07/27/12  4:50 PM      Result Value Range Status   Specimen  Description BLOOD RIGHT HAND   Final   Special Requests BOTTLES DRAWN AEROBIC ONLY [place]   Final   Culture  Setup Time 07/27/2012 21:38   Final   Culture NO GROWTH 5 DAYS   Final   Report Status 08/02/2012 FINAL   Final  URINE CULTURE     Status: None   Collection Time    07/27/12  5:10 PM  Result Value Range Status   Specimen Description URINE, CLEAN CATCH   Final   Special Requests NONE   Final   Culture  Setup Time 07/27/2012 21:57   Final   Colony Count NO GROWTH   Final   Culture NO GROWTH   Final   Report Status 07/28/2012 FINAL   Final  MRSA PCR SCREENING     Status: None   Collection Time    07/27/12  9:17 PM      Result Value Range Status   MRSA by PCR NEGATIVE  NEGATIVE Final   Comment:            The GeneXpert MRSA Assay (FDA     approved for NASAL specimens     only), is one component of a     comprehensive MRSA colonization     surveillance program. It is not     intended to diagnose MRSA     infection nor to guide or     monitor treatment for     MRSA infections.  CULTURE, BLOOD (ROUTINE X 2)     Status: None   Collection Time    07/31/12  2:37 PM      Result Value Range Status   Specimen Description BLOOD RIGHT ARM   Final   Special Requests BOTTLES DRAWN AEROBIC ONLY 10CC   Final   Culture  Setup Time 07/31/2012 23:11   Final   Culture     Final   Value:        BLOOD CULTURE RECEIVED NO GROWTH TO DATE CULTURE WILL BE HELD FOR 5 DAYS BEFORE ISSUING A FINAL NEGATIVE REPORT   Report Status PENDING   Incomplete  CULTURE, BLOOD (ROUTINE X 2)     Status: None   Collection Time    07/31/12  3:15 PM      Result Value Range Status   Specimen Description BLOOD RIGHT ARM   Final   Special Requests BOTTLES DRAWN AEROBIC AND ANAEROBIC 10CC   Final   Culture  Setup Time 07/31/2012 23:12   Final   Culture     Final   Value:        BLOOD CULTURE RECEIVED NO GROWTH TO DATE CULTURE WILL BE HELD FOR 5 DAYS BEFORE ISSUING A FINAL NEGATIVE REPORT   Report Status PENDING    Incomplete  URINE CULTURE     Status: None   Collection Time    08/01/12  6:07 PM      Result Value Range Status   Specimen Description URINE, CATHETERIZED   Final   Special Requests NONE   Final   Culture  Setup Time 08/01/2012 18:48   Final   Colony Count NO GROWTH   Final   Culture NO GROWTH   Final   Report Status 08/02/2012 FINAL   Final     Studies: No results found.  Scheduled Meds: . aspirin  81 mg Oral Daily  . enoxaparin (LOVENOX) injection  40 mg Subcutaneous Q24H  . famotidine  10 mg Oral BID  . insulin aspart  0-15 Units Subcutaneous TID WC  . insulin aspart  3 Units Subcutaneous TID WC  . insulin detemir  25 Units Subcutaneous Q24H  . metoprolol tartrate  25 mg Oral BID  . piperacillin-tazobactam (ZOSYN)  IV  3.375 g Intravenous Q8H  . polyethylene glycol  17 g Oral Daily  . sodium chloride  3 mL Intravenous Q12H  . vancomycin  1,000 mg Intravenous Q12H  Continuous Infusions: . sodium chloride 20 mL/hr at 07/31/12 1343  . sodium chloride 75 mL/hr at 08/03/12 1252      Time spent:35 minutes    [facility]  [facility] Pager [phone]. If 7PM-7AM, please contact night-coverage at [email or web address], password [facility] 08/03/2012, 4:44 PM  LOS: 7 days

## 2012-08-04 ENCOUNTER — Encounter (HOSPITAL_COMMUNITY): Payer: Self-pay | Admitting: Orthopedic Surgery

## 2012-08-04 ENCOUNTER — Inpatient Hospital Stay (HOSPITAL_COMMUNITY): Payer: Medicare Other

## 2012-08-04 LAB — URINALYSIS, ROUTINE W REFLEX MICROSCOPIC
Bilirubin Urine: NEGATIVE
Nitrite: NEGATIVE
Specific Gravity, Urine: 1.011 (ref 1.005–1.030)
Urobilinogen, UA: >8 mg/dL — ABNORMAL HIGH (ref 0.0–1.0)

## 2012-08-04 LAB — BASIC METABOLIC PANEL
BUN: 19 mg/dL (ref 6–23)
CO2: 28 mEq/L (ref 19–32)
Chloride: 98 mEq/L (ref 96–112)
Glucose, Bld: 90 mg/dL (ref 70–99)
Potassium: 4.5 mEq/L (ref 3.5–5.1)

## 2012-08-04 LAB — GLUCOSE, CAPILLARY
Glucose-Capillary: 84 mg/dL (ref 70–99)
Glucose-Capillary: 131 mg/dL — ABNORMAL HIGH (ref 70–99)
Glucose-Capillary: 199 mg/dL — ABNORMAL HIGH (ref 70–99)

## 2012-08-04 MED ORDER — NITROGLYCERIN 0.4 MG SL SUBL
SUBLINGUAL_TABLET | SUBLINGUAL | Status: AC
  Administered 2012-08-04: 0.4 mg
  Filled 2012-08-04: qty 25

## 2012-08-04 MED ORDER — VANCOMYCIN HCL IN DEXTROSE 750-5 MG/150ML-% IV SOLN
750.0000 mg | Freq: Two times a day (BID) | INTRAVENOUS | Status: DC
  Administered 2012-08-04 – 2012-08-07 (×6): 750 mg via INTRAVENOUS
  Filled 2012-08-04 (×8): qty 150

## 2012-08-04 NOTE — Progress Notes (Signed)
[facility] PROGRESS NOTE  [name] [id number] DOB: Sep 12, 1950 DOA: 07/27/2012 PCP: No PCP Per Patient  Brief narrative:  62 year old male with history of diabetes mellitus ( was on oral hypoglycemics until one year back and stopped taking his meds), CAD status post CABG X4 in 2008 history of CVA, history of tobacco and alcohol use presented to the ED after a mechanical fall at home and hurt his neck. Also complained of pain over his right toe that he has noticed since 3 days.patient noted to have gangrene of his right 3rd toe with sepsis and DKA. admitted to stepdown and transferred to telemetry .   Assessment/Plan:   Severe sepsis  marked leucocytosis, elevated lactate secondary to gangrenous third right toe. X ray shows osteomyelitis. MRI rt foot shows osteomyelitis of right third toe with cellulitis of foot.  Empirically covering with IV vanco and zosyn. Blood cx so far no growth .  Appreciate ortho recommendations. Third Toe amputation done on 5/24. However he still has fever and other toe appear necrotic. ABI ordered and per ortho . <0.9 in both legs. He underwent right foot transmetatarsal amputation.  Recommend drain for 24 hour and to see if the skin at the mid foot level survives. , if there is necrosis of the skin in the next 48 hours , he will need BKA. Discussed with the patient.   Pain control   repeat blood cx CXR negative. Urine cultures.   DKA (diabetic ketoacidoses)  In the setting of sepsis and severe non compliance  Started on insulin drip on admission.AG closed and placed on levemir. A1C of 10.9.  Increased levemir dose and add premeal aspart  CBG (last 3)   Recent Labs  08/03/12 2118 08/04/12 0659 08/04/12 1108  GLUCAP 166* 84 131*     H/O: CVA (cerebrovascular accident)  No residual weakness. continue ASA   Hypertension  Not on any BP meds. On perioperative BB.   CAD S/P CABG x 4 in 2008  informs seeing Dr Terrence Dupont infrequently. Discussed with  him on 5/22. No further recommendations   Anemia  Low MCV noted on iron panel. No recent blood work. Drop in H&H noted. No gross GI bleed. Stool for occult blood pending. A total of 4 units of prbc was given to the patient.  His H&h remains between 9 to 10.   Tobacco abuse/ Alcohol abuse  Reports to have quit   Low urine output on 5/27 Check bladder scan with almost 400 cc residue. Ordered foley. I/O last 3 completed shifts: In: 2220 [P.O.:1320; I.V.:900] Out: 4600 [Urine:4600] Total I/O In: 600 [P.O.:600] Out: 601 [Urine:600; Stool:1]  Hyponatremia and worsening of renal insufficiency: probably from dehydration. Wills tart him on IV normal saline and repeat labs in am.   Fever despite being on antibiotics: blood cultures ordered. CXR  And UA pending. Removed foley catheter.    DVT prophylaxis  Sq lovenox   Diet: Diabetic   Code Status:full  Family Communication:sister at bedside  Disposition Plan: possible further amputation of toes.    Consultants:  Ortho ( Dr Berenice Primas)  Procedures:  Rt third toe MTP amputation on 5/24 .   Antibiotics:  IV vnaco / zosyn: 5/22>>   HPI/Subjective:  Frustrated that he might need surgery int he future.   Objective: Filed Vitals:   08/03/12 1345 08/03/12 2116 08/04/12 0517 08/04/12 1350  BP: 148/58 141/81 117/100 151/58  Pulse: 75 88 75 70  Temp: 98.8 F (37.1 C) 100.2 F (37.9 C) 100.9  F (38.3 C) 99.4 F (37.4 C)  TempSrc: Oral Oral Oral Oral  Resp: 18 18 18 18   Height:      Weight:      SpO2: 99% 97% 98% 99%    Intake/Output Summary (Last 24 hours) at 08/04/12 1407 Last data filed at 08/04/12 1300  Gross per 24 hour  Intake   1860 ml  Output   3001 ml  Net  -1141 ml   Filed Weights   07/28/12 0600 07/29/12 0036 07/31/12 0411  Weight: 82.2 kg (181 lb 3.5 oz) 84 kg (185 lb 3 oz) 88.9 kg (195 lb 15.8 oz)    Exam: General: middle aged male in NAD  HEENT: no pallor, moist mucosa  Cardiovascular: S1&S2 normal, 3/6  systolic murmur  Respiratory: clear b/l, no added sounds  Abdomen: soft, NT, ND, BS+  Musculoskeletal:right foot dressing. Trace edema over right foot extending to distal tibia . Other toes appears necrotic as well CNS: AAOX3      Data Reviewed: Basic Metabolic Panel:  Recent Labs Lab 08/02/12 0852 08/03/12 0455 08/04/12 0425  NA 131* 127* 133*  K 3.7 4.1 4.5  CL 97 93* 98  CO2 22 22 28   GLUCOSE 92 190* 90  BUN 24* 24* 19  CREATININE 1.25 1.38* 1.40*  CALCIUM 8.2* 8.1* 8.2*   Liver Function Tests: No results found for this basename: AST, ALT, ALKPHOS, BILITOT, PROT, ALBUMIN,  in the last 168 hours No results found for this basename: LIPASE, AMYLASE,  in the last 168 hours No results found for this basename: AMMONIA,  in the last 168 hours CBC:  Recent Labs Lab 07/30/12 0440 07/31/12 0400 08/01/12 0520 08/02/12 0852 08/03/12 0455  WBC 21.3* 25.8* 23.3* 27.0* 25.7*  HGB 10.7* 9.1* 8.2* 10.2* 9.5*  HCT 29.8* 25.7* 24.0* 29.8* 27.3*  MCV 77.0* 76.7* 77.9* 80.3 78.2  PLT 339 332 333 331 363   Cardiac Enzymes: No results found for this basename: CKTOTAL, CKMB, CKMBINDEX, TROPONINI,  in the last 168 hours BNP (last 3 results) No results found for this basename: PROBNP,  in the last 8760 hours CBG:  Recent Labs Lab 08/03/12 1118 08/03/12 1610 08/03/12 2118 08/04/12 0659 08/04/12 1108  GLUCAP 163* 183* 166* 84 131*    Recent Results (from the past 240 hour(s))  CULTURE, BLOOD (ROUTINE X 2)     Status: None   Collection Time    07/27/12  4:40 PM      Result Value Range Status   Specimen Description BLOOD LEFT ANTECUBITAL   Final   Special Requests BOTTLES DRAWN AEROBIC ONLY 4CC   Final   Culture  Setup Time 07/27/2012 21:39   Final   Culture NO GROWTH 5 DAYS   Final   Report Status 08/02/2012 FINAL   Final  CULTURE, BLOOD (ROUTINE X 2)     Status: None   Collection Time    07/27/12  4:50 PM      Result Value Range Status   Specimen Description BLOOD  RIGHT HAND   Final   Special Requests BOTTLES DRAWN AEROBIC ONLY [place]   Final   Culture  Setup Time 07/27/2012 21:38   Final   Culture NO GROWTH 5 DAYS   Final   Report Status 08/02/2012 FINAL   Final  URINE CULTURE     Status: None   Collection Time    07/27/12  5:10 PM      Result Value Range Status   Specimen Description URINE, CLEAN  CATCH   Final   Special Requests NONE   Final   Culture  Setup Time 07/27/2012 21:57   Final   Colony Count NO GROWTH   Final   Culture NO GROWTH   Final   Report Status 07/28/2012 FINAL   Final  MRSA PCR SCREENING     Status: None   Collection Time    07/27/12  9:17 PM      Result Value Range Status   MRSA by PCR NEGATIVE  NEGATIVE Final   Comment:            The GeneXpert MRSA Assay (FDA     approved for NASAL specimens     only), is one component of a     comprehensive MRSA colonization     surveillance program. It is not     intended to diagnose MRSA     infection nor to guide or     monitor treatment for     MRSA infections.  CULTURE, BLOOD (ROUTINE X 2)     Status: None   Collection Time    07/31/12  2:37 PM      Result Value Range Status   Specimen Description BLOOD RIGHT ARM   Final   Special Requests BOTTLES DRAWN AEROBIC ONLY 10CC   Final   Culture  Setup Time 07/31/2012 23:11   Final   Culture     Final   Value:        BLOOD CULTURE RECEIVED NO GROWTH TO DATE CULTURE WILL BE HELD FOR 5 DAYS BEFORE ISSUING A FINAL NEGATIVE REPORT   Report Status PENDING   Incomplete  CULTURE, BLOOD (ROUTINE X 2)     Status: None   Collection Time    07/31/12  3:15 PM      Result Value Range Status   Specimen Description BLOOD RIGHT ARM   Final   Special Requests BOTTLES DRAWN AEROBIC AND ANAEROBIC 10CC   Final   Culture  Setup Time 07/31/2012 23:12   Final   Culture     Final   Value:        BLOOD CULTURE RECEIVED NO GROWTH TO DATE CULTURE WILL BE HELD FOR 5 DAYS BEFORE ISSUING A FINAL NEGATIVE REPORT   Report Status PENDING   Incomplete   URINE CULTURE     Status: None   Collection Time    08/01/12  6:07 PM      Result Value Range Status   Specimen Description URINE, CATHETERIZED   Final   Special Requests NONE   Final   Culture  Setup Time 08/01/2012 18:48   Final   Colony Count NO GROWTH   Final   Culture NO GROWTH   Final   Report Status 08/02/2012 FINAL   Final     Studies: No results found.  Scheduled Meds: . aspirin  81 mg Oral Daily  . enoxaparin (LOVENOX) injection  40 mg Subcutaneous Q24H  . famotidine  10 mg Oral BID  . insulin aspart  0-15 Units Subcutaneous TID WC  . insulin aspart  3 Units Subcutaneous TID WC  . insulin detemir  25 Units Subcutaneous Q24H  . metoprolol tartrate  25 mg Oral BID  . piperacillin-tazobactam (ZOSYN)  IV  3.375 g Intravenous Q8H  . polyethylene glycol  17 g Oral Daily  . sodium chloride  3 mL Intravenous Q12H  . vancomycin  1,000 mg Intravenous Q12H   Continuous Infusions: . sodium chloride 20 mL/hr at 07/31/12  1343  . sodium chloride 75 mL/hr at 08/04/12 0029      Time spent:35 minutes    [facility]  [facility] Pager [phone]. If 7PM-7AM, please contact night-coverage at [email or web address], password [facility] 08/04/2012, 2:07 PM  LOS: 8 days

## 2012-08-04 NOTE — Progress Notes (Signed)
S:  Awake, alert.  Eating breakfast.  Denies N/V.  Reports improvement in right foot pain.  O: Right foot dressing with bloody drainage.  Dressing was changed today.  The skin over the plantar aspect of the midfoot has a slightly mottled appearance.  Incision appears benign with no signs of infection.  A: s/p right foot transmetatarsal amputation  P: We will continue to monitor the skin to the right foot to see if the skin flap survives.  Dr. Mayer Camel has discussed the possibility of a BKA with the patient.  Dr. Berenice Primas will follow this weekend.

## 2012-08-04 NOTE — Progress Notes (Signed)
Pt voided 200 cc of urine after removal of foley catheter. Pt ambulated with rolling walker and Darco shoe in place to bathroom with 2 assist. Pt tolerated well ambulation well but stated "It will take some time to get use to walking with this shoe". Marko Plume

## 2012-08-04 NOTE — Progress Notes (Signed)
PT Cancellation Note  Patient Details Name: [name] MRN: [id number] DOB: [birth year]   Cancelled Treatment:    Reason Eval/Treat Not Completed: Other (comment) (pt refused )   [name],[name] 08/04/2012, 2:46 PM  [facility] [phone] (pager)

## 2012-08-04 NOTE — Progress Notes (Signed)
ANTIBIOTIC CONSULT NOTE -follow up  Pharmacy Consult for Vancomycin/zosyn Indication: gangrene in rt third toe  No Known Allergies  Patient Measurements: Height: 5\' 11"  (180.3 cm) Weight: 195 lb 15.8 oz (88.9 kg) IBW/kg (Calculated) : 75.3   Vital Signs: Temp: 99.4 F (37.4 C) (05/30 1350) Temp src: Oral (05/30 1350) BP: 151/58 mmHg (05/30 1350) Pulse Rate: 70 (05/30 1350) Intake/Output from previous day: 05/29 0701 - 05/30 0700 In: 1980 [P.O.:1080; I.V.:900] Out: 3300 [Urine:3300] Intake/Output from this shift: Total I/O In: 600 [P.O.:600] Out: 601 [Urine:600; Stool:1]  Labs:  Recent Labs  08/02/12 0852 08/03/12 0455 08/04/12 0425  WBC 27.0* 25.7*  --   HGB 10.2* 9.5*  --   PLT 331 363  --   CREATININE 1.25 1.38* 1.40*   Estimated Creatinine Clearance: 58.3 ml/min (by C-G formula based on Cr of 1.4).  Recent Labs  08/04/12 1451  [place] 24.4*     Microbiology: Recent Results (from the past 720 hour(s))  CULTURE, BLOOD (ROUTINE X 2)     Status: None   Collection Time    07/27/12  4:40 PM      Result Value Range Status   Specimen Description BLOOD LEFT ANTECUBITAL   Final   Special Requests BOTTLES DRAWN AEROBIC ONLY 4CC   Final   Culture  Setup Time 07/27/2012 21:39   Final   Culture NO GROWTH 5 DAYS   Final   Report Status 08/02/2012 FINAL   Final  CULTURE, BLOOD (ROUTINE X 2)     Status: None   Collection Time    07/27/12  4:50 PM      Result Value Range Status   Specimen Description BLOOD RIGHT HAND   Final   Special Requests BOTTLES DRAWN AEROBIC ONLY [place]   Final   Culture  Setup Time 07/27/2012 21:38   Final   Culture NO GROWTH 5 DAYS   Final   Report Status 08/02/2012 FINAL   Final  URINE CULTURE     Status: None   Collection Time    07/27/12  5:10 PM      Result Value Range Status   Specimen Description URINE, CLEAN CATCH   Final   Special Requests NONE   Final   Culture  Setup Time 07/27/2012 21:57   Final   Colony Count NO GROWTH    Final   Culture NO GROWTH   Final   Report Status 07/28/2012 FINAL   Final  MRSA PCR SCREENING     Status: None   Collection Time    07/27/12  9:17 PM      Result Value Range Status   MRSA by PCR NEGATIVE  NEGATIVE Final   Comment:            The GeneXpert MRSA Assay (FDA     approved for NASAL specimens     only), is one component of a     comprehensive MRSA colonization     surveillance program. It is not     intended to diagnose MRSA     infection nor to guide or     monitor treatment for     MRSA infections.  CULTURE, BLOOD (ROUTINE X 2)     Status: None   Collection Time    07/31/12  2:37 PM      Result Value Range Status   Specimen Description BLOOD RIGHT ARM   Final   Special Requests BOTTLES DRAWN AEROBIC ONLY 10CC   Final   Culture  Setup Time 07/31/2012 23:11   Final   Culture     Final   Value:        BLOOD CULTURE RECEIVED NO GROWTH TO DATE CULTURE WILL BE HELD FOR 5 DAYS BEFORE ISSUING A FINAL NEGATIVE REPORT   Report Status PENDING   Incomplete  CULTURE, BLOOD (ROUTINE X 2)     Status: None   Collection Time    07/31/12  3:15 PM      Result Value Range Status   Specimen Description BLOOD RIGHT ARM   Final   Special Requests BOTTLES DRAWN AEROBIC AND ANAEROBIC 10CC   Final   Culture  Setup Time 07/31/2012 23:12   Final   Culture     Final   Value:        BLOOD CULTURE RECEIVED NO GROWTH TO DATE CULTURE WILL BE HELD FOR 5 DAYS BEFORE ISSUING A FINAL NEGATIVE REPORT   Report Status PENDING   Incomplete  URINE CULTURE     Status: None   Collection Time    08/01/12  6:07 PM      Result Value Range Status   Specimen Description URINE, CATHETERIZED   Final   Special Requests NONE   Final   Culture  Setup Time 08/01/2012 18:48   Final   Colony Count NO GROWTH   Final   Culture NO GROWTH   Final   Report Status 08/02/2012 FINAL   Final   Assessment: 62 yr old male arrived at ED after a fall at home. 3rd right toe was painful and discolored. He had stopped his  DM meds a year ago (metformin and glipizide).  Blood glucose was >600. WBC 29K. Was started on an insulin drip, zosyn and vancomycin per pharmacy.    5/24:  Right third toe MTP amputation with irrigation and debridement of soft tissues  5/25 - Vancomycin trough 16.9 on 1g IV q12h which is therapeutic  5/28 back to OR for amputation of R 2nd and 4th toes as well as soft tissue from R forefoot  WBC up to 27, tmax 101.2,  Creat 1.25.   Am vanc dose missed b/c pt in OR but got zosyn dose.  All cx negative.  5/30 all subsequent vancomycin doses charted VT 24.4 with slight bump in Cr 1.4, Tm 1009 for last 24hr - will drop vancpmycin dose to prevent accumulation and follow closley  Goal of Therapy:  Vancomycin trough level 15-20 mcg/ml  Plan:  Decrease Vancomycin 750 mg IV q12hrs.  Continue Zosyn 3.375g IV q8h (infuse over 4 hours)  Bonnita Nasuti Pharm.D. CPP, BCPS Clinical Pharmacist [phone] 08/04/2012 4:06 PM

## 2012-08-04 NOTE — Progress Notes (Signed)
Dressing changed to right foot. Cleansed and new dressing applied. Incision bleeding moderate amount. Incision open around sutures. Marko Plume

## 2012-08-04 NOTE — Clinical Social Work Placement (Addendum)
    Clinical Social Work Department CLINICAL SOCIAL WORK PLACEMENT NOTE 08/04/2012  Patient:  [name],[name]  Account Number:  [id number] Admit date:  07/27/2012  Clinical Social Worker:  Otilio Saber  Date/time:  08/04/2012 04:16 PM  Clinical Social Work is seeking post-discharge placement for this patient at the following level of care:   [place]   (*CSW will update this form in Epic as items are completed)   08/04/2012  Patient/family provided with [facility] Department of Clinical Social Work's list of facilities offering this level of care within the geographic area requested by the patient (or if unable, by the patient's family).  08/04/2012  Patient/family informed of their freedom to choose among providers that offer the needed level of care, that participate in Medicare, Medicaid or managed care program needed by the patient, have an available bed and are willing to accept the patient.  08/04/2012  Patient/family informed of [facility]' ownership interest in [facility], as well as of the fact that they are under no obligation to receive care at this facility.  PASARR submitted to EDS on 08/04/2012 PASARR number received from EDS on 08/04/2012  FL2 transmitted to all facilities in geographic area requested by pt/family on  08/04/2012 FL2 transmitted to all facilities within larger geographic area on   Patient informed that his/her managed care company has contracts with or will negotiate with  certain facilities, including the following:     Patient/family informed of bed offers received:  08/08/12 Patient chooses bed at [facility]) Physician recommends and patient chooses bed at  SNF   Patient to be transferred to  on   Patient to be transferred to facility by   The following physician request were entered in Epic:   Additional Comments:

## 2012-08-04 NOTE — Progress Notes (Signed)
Foley catheter d/c at 1125. Urine culture sent. Will continue to monitor output. Marko Plume

## 2012-08-04 NOTE — Clinical Social Work Psychosocial (Signed)
     Clinical Social Work Department BRIEF PSYCHOSOCIAL ASSESSMENT 08/04/2012  Patient:  [name],[name]     Account Number:  [id number]     Admit date:  07/27/2012  Clinical Social Worker:  Otilio Saber  Date/Time:  08/04/2012 04:11 PM  Referred by:  RN  Date Referred:  08/03/2012 Referred for  SNF Placement   Other Referral:   Interview type:  Patient Other interview type:    PSYCHOSOCIAL DATA Living Status:  ALONE Admitted from facility:   Level of care:   Primary support name:  Dreama Saa Primary support relationship to patient:  FRIEND Degree of support available:    CURRENT CONCERNS Current Concerns  Post-Acute Placement   Other Concerns:    SOCIAL WORK ASSESSMENT / PLAN CLinical Social Worker received referral for patient needing short term SNF placement at discharge. CSW introduced self and explained reason for visit. Patient is agreeable for CSW to initiate SNF search in [facility].    CSW will complete FL2 for MD's signature and will update patient when bed offers are made.   Assessment/plan status:  Psychosocial Support/Ongoing Assessment of Needs Other assessment/ plan:   Information/referral to community resources:   SNF packet    PATIENTS/FAMILYS RESPONSE TO PLAN OF CARE: Patient reported that he is agreeable for CSW to intiate SNF search in [place].

## 2012-08-05 LAB — URINE CULTURE: Colony Count: >=100000

## 2012-08-05 LAB — BASIC METABOLIC PANEL
Chloride: 99 mEq/L (ref 96–112)
GFR calc Af Amer: 61 mL/min — ABNORMAL LOW (ref >90)
Potassium: 4.8 mEq/L (ref 3.5–5.1)
Sodium: 134 mEq/L — ABNORMAL LOW (ref 135–145)

## 2012-08-05 LAB — CBC
HCT: 24.4 % — ABNORMAL LOW (ref 39.0–52.0)
Hemoglobin: 8.4 g/dL — ABNORMAL LOW (ref 13.0–17.0)
RBC: 2.99 MIL/uL — ABNORMAL LOW (ref 4.22–5.81)
RDW: 16.6 % — ABNORMAL HIGH (ref 11.5–15.5)
WBC: 25.6 10*3/uL — ABNORMAL HIGH (ref 4.0–10.5)

## 2012-08-05 LAB — GLUCOSE, CAPILLARY
Glucose-Capillary: 152 mg/dL — ABNORMAL HIGH (ref 70–99)
Glucose-Capillary: 119 mg/dL — ABNORMAL HIGH (ref 70–99)

## 2012-08-05 LAB — PROCALCITONIN: Procalcitonin: 1.84 ng/mL

## 2012-08-05 NOTE — Progress Notes (Signed)
Right foot dressing changed using sterile technique. Sutures intact but site not well approximated.  Serousangunous oozing noted from site as well at time. Will continue to monitor. Darco boot applied to right foot. Patient assisted to bedside chair x 2 assist. Denies pain or distress. Will monitor. Call bell near. Bed linens changed at this time. Cindee Salt

## 2012-08-05 NOTE — Progress Notes (Signed)
[facility] PROGRESS NOTE  [name] [id number] DOB: [birth year] DOA: 07/27/2012 PCP: No PCP Per Patient  Brief narrative:  62 year old male with history of diabetes mellitus ( was on oral hypoglycemics until one year back and stopped taking his meds), CAD status post CABG X4 in 2008 history of CVA, history of tobacco and alcohol use presented to the ED after a mechanical fall at home and hurt his neck. Also complained of pain over his right toe that he has noticed since 3 days.patient noted to have gangrene of his right 3rd toe with sepsis and DKA. admitted to stepdown and transferred to telemetry .   Assessment/Plan:   Severe sepsis  marked leucocytosis, elevated lactate secondary to gangrenous third right toe. X ray shows osteomyelitis. MRI rt foot shows osteomyelitis of right third toe with cellulitis of foot.  Empirically covering with IV vanco and zosyn. Blood cx so far no growth .  Appreciate ortho recommendations. Third Toe amputation done on 5/24. However he still has fever and other toe appear necrotic. ABI ordered and per ortho . <0.9 in both legs. He underwent right foot transmetatarsal amputation.  Recommend drain for 24 hour and to see if the skin at the mid foot level survives. , if there is necrosis of the skin in the next 48 hours , he will need BKA. Discussed with the patient.   Pain control   repeat blood cx CXR negative. Urine cultures pending.   DKA (diabetic ketoacidoses)  In the setting of sepsis and severe non compliance  Started on insulin drip on admission.AG closed and placed on levemir. A1C of 10.9.  Increased levemir dose and add premeal aspart  CBG (last 3)   Recent Labs  08/04/12 2200 08/05/12 0657 08/05/12 1212  GLUCAP 166* 152* 119*   cbg's better controlled.   H/O: CVA (cerebrovascular accident)  No residual weakness. continue ASA   Hypertension  Not on any BP meds. On perioperative BB.   CAD S/P CABG x 4 in 2008  informs seeing Dr  Terrence Dupont infrequently. Discussed with him on 5/22. No further recommendations   Anemia / anemia of BLOOD loss from the foot:  Low MCV noted on iron panel. No recent blood work. Drop in H&H noted. No gross GI bleed. Stool for occult blood pending. A total of 4 units of prbc was given to the patient.  His H&h  Today is 8.4/24.4, if his H&h drops to less than 8 we will transfuse him.   Tobacco abuse/ Alcohol abuse  Reports to have quit   Low urine output on 5/27 Check bladder scan with almost 400 cc residue. I/O last 3 completed shifts: In: 2400 [P.O.:600; I.V.:1800] Out: 3351 [Urine:3350; Stool:1] Total I/O In: 240 [P.O.:240] Out: 925 [Urine:925]  Hyponatremia and worsening of renal insufficiency: probably from dehydration. Wills tart him on IV normal saline and repeat labs in am. Hyponatremia improved. But his creatinine appears to stabilized at 1.4.   Fever despite being on antibiotics:  His fevers have come down. blood cultures ordered. CXR does not show pneumonia  And UA appears abnormal. Urine cultures are sent.  And removed foley catheter.    DVT prophylaxis  Sq lovenox   Diet: Diabetic   Code Status:full  Family Communication:sister at bedside  Disposition Plan: possible further amputation of toes.    Consultants:  Ortho ( Dr Berenice Primas)  Procedures:  Rt third toe MTP amputation on 5/24 .   Antibiotics:  IV vnaco / zosyn: 5/22>>   HPI/Subjective:  Frustrated that he might need surgery int he future.   Objective: Filed Vitals:   08/04/12 1955 08/05/12 0427 08/05/12 1049 08/05/12 1409  BP: 132/58 154/76 153/68 166/69  Pulse: 95 74 79 68  Temp: 99.5 F (37.5 C) 98.2 F (36.8 C)  99.9 F (37.7 C)  TempSrc: Oral Oral  Oral  Resp: 16 18  18   Height:      Weight:      SpO2: 98% 97%  99%    Intake/Output Summary (Last 24 hours) at 08/05/12 1450 Last data filed at 08/05/12 1409  Gross per 24 hour  Intake   1140 ml  Output   1775 ml  Net   -635 ml   Filed  Weights   07/28/12 0600 07/29/12 0036 07/31/12 0411  Weight: 82.2 kg (181 lb 3.5 oz) 84 kg (185 lb 3 oz) 88.9 kg (195 lb 15.8 oz)    Exam: General: middle aged male in NAD  HEENT: no pallor, moist mucosa  Cardiovascular: S1&S2 normal, 3/6 systolic murmur  Respiratory: clear b/l, no added sounds  Abdomen: soft, NT, ND, BS+  Musculoskeletal:right foot dressing. Trace edema over right foot extending to distal tibia . Other toes appears necrotic as well CNS: AAOX3      Data Reviewed: Basic Metabolic Panel:  Recent Labs Lab 08/02/12 0852 08/03/12 0455 08/04/12 0425 08/05/12 0348  NA 131* 127* 133* 134*  K 3.7 4.1 4.5 4.8  CL 97 93* 98 99  CO2 22 22 28 27   GLUCOSE 92 190* 90 89  BUN 24* 24* 19 19  CREATININE 1.25 1.38* 1.40* 1.40*  CALCIUM 8.2* 8.1* 8.2* 8.2*   Liver Function Tests: No results found for this basename: AST, ALT, ALKPHOS, BILITOT, PROT, ALBUMIN,  in the last 168 hours No results found for this basename: LIPASE, AMYLASE,  in the last 168 hours No results found for this basename: AMMONIA,  in the last 168 hours CBC:  Recent Labs Lab 07/31/12 0400 08/01/12 0520 08/02/12 0852 08/03/12 0455 08/05/12 0348  WBC 25.8* 23.3* 27.0* 25.7* 25.6*  HGB 9.1* 8.2* 10.2* 9.5* 8.4*  HCT 25.7* 24.0* 29.8* 27.3* 24.4*  MCV 76.7* 77.9* 80.3 78.2 81.6  PLT 332 333 331 363 431*   Cardiac Enzymes: No results found for this basename: CKTOTAL, CKMB, CKMBINDEX, TROPONINI,  in the last 168 hours BNP (last 3 results) No results found for this basename: PROBNP,  in the last 8760 hours CBG:  Recent Labs Lab 08/04/12 1108 08/04/12 1651 08/04/12 2200 08/05/12 0657 08/05/12 1212  GLUCAP 131* 199* 166* 152* 119*    Recent Results (from the past 240 hour(s))  CULTURE, BLOOD (ROUTINE X 2)     Status: None   Collection Time    07/27/12  4:40 PM      Result Value Range Status   Specimen Description BLOOD LEFT ANTECUBITAL   Final   Special Requests BOTTLES DRAWN AEROBIC  ONLY 4CC   Final   Culture  Setup Time 07/27/2012 21:39   Final   Culture NO GROWTH 5 DAYS   Final   Report Status 08/02/2012 FINAL   Final  CULTURE, BLOOD (ROUTINE X 2)     Status: None   Collection Time    07/27/12  4:50 PM      Result Value Range Status   Specimen Description BLOOD RIGHT HAND   Final   Special Requests BOTTLES DRAWN AEROBIC ONLY [place]   Final   Culture  Setup Time 07/27/2012 21:38  Final   Culture NO GROWTH 5 DAYS   Final   Report Status 08/02/2012 FINAL   Final  URINE CULTURE     Status: None   Collection Time    07/27/12  5:10 PM      Result Value Range Status   Specimen Description URINE, CLEAN CATCH   Final   Special Requests NONE   Final   Culture  Setup Time 07/27/2012 21:57   Final   Colony Count NO GROWTH   Final   Culture NO GROWTH   Final   Report Status 07/28/2012 FINAL   Final  MRSA PCR SCREENING     Status: None   Collection Time    07/27/12  9:17 PM      Result Value Range Status   MRSA by PCR NEGATIVE  NEGATIVE Final   Comment:            The GeneXpert MRSA Assay (FDA     approved for NASAL specimens     only), is one component of a     comprehensive MRSA colonization     surveillance program. It is not     intended to diagnose MRSA     infection nor to guide or     monitor treatment for     MRSA infections.  CULTURE, BLOOD (ROUTINE X 2)     Status: None   Collection Time    07/31/12  2:37 PM      Result Value Range Status   Specimen Description BLOOD RIGHT ARM   Final   Special Requests BOTTLES DRAWN AEROBIC ONLY 10CC   Final   Culture  Setup Time 07/31/2012 23:11   Final   Culture     Final   Value:        BLOOD CULTURE RECEIVED NO GROWTH TO DATE CULTURE WILL BE HELD FOR 5 DAYS BEFORE ISSUING A FINAL NEGATIVE REPORT   Report Status PENDING   Incomplete  CULTURE, BLOOD (ROUTINE X 2)     Status: None   Collection Time    07/31/12  3:15 PM      Result Value Range Status   Specimen Description BLOOD RIGHT ARM   Final   Special  Requests BOTTLES DRAWN AEROBIC AND ANAEROBIC 10CC   Final   Culture  Setup Time 07/31/2012 23:12   Final   Culture     Final   Value:        BLOOD CULTURE RECEIVED NO GROWTH TO DATE CULTURE WILL BE HELD FOR 5 DAYS BEFORE ISSUING A FINAL NEGATIVE REPORT   Report Status PENDING   Incomplete  URINE CULTURE     Status: None   Collection Time    08/01/12  6:07 PM      Result Value Range Status   Specimen Description URINE, CATHETERIZED   Final   Special Requests NONE   Final   Culture  Setup Time 08/01/2012 18:48   Final   Colony Count NO GROWTH   Final   Culture NO GROWTH   Final   Report Status 08/02/2012 FINAL   Final  CULTURE, BLOOD (ROUTINE X 2)     Status: None   Collection Time    08/04/12 10:00 AM      Result Value Range Status   Specimen Description BLOOD RIGHT ARM   Final   Special Requests BOTTLES DRAWN AEROBIC AND ANAEROBIC 10CC   Final   Culture  Setup Time 08/04/2012 15:19   Final  Culture     Final   Value:        BLOOD CULTURE RECEIVED NO GROWTH TO DATE CULTURE WILL BE HELD FOR 5 DAYS BEFORE ISSUING A FINAL NEGATIVE REPORT   Report Status PENDING   Incomplete     Studies: Dg Chest 2 View  08/04/2012   *RADIOLOGY REPORT*  Clinical Data: Fever and cough.  CHEST - 2 VIEW  Comparison: Single view of the chest 07/31/2012 and PA and lateral chest 07/27/2012.  Findings: The patient has very small bilateral pleural effusions seen on the lateral view.  No consolidative process, pneumothorax or effusion is identified.  Heart size is upper normal.  The patient is status post CABG.  IMPRESSION: Small bilateral pleural effusions.   Original Report Authenticated By: Orlean Patten, M.D.    Scheduled Meds: . aspirin  81 mg Oral Daily  . enoxaparin (LOVENOX) injection  40 mg Subcutaneous Q24H  . famotidine  10 mg Oral BID  . insulin aspart  0-15 Units Subcutaneous TID WC  . insulin aspart  3 Units Subcutaneous TID WC  . insulin detemir  25 Units Subcutaneous Q24H  . metoprolol  tartrate  25 mg Oral BID  . piperacillin-tazobactam (ZOSYN)  IV  3.375 g Intravenous Q8H  . polyethylene glycol  17 g Oral Daily  . sodium chloride  3 mL Intravenous Q12H  . vancomycin  750 mg Intravenous Q12H   Continuous Infusions: . sodium chloride 20 mL/hr at 07/31/12 1343  . sodium chloride 75 mL/hr at 08/04/12 2146      Time spent:35 minutes    [facility]  [facility] Pager [phone]. If 7PM-7AM, please contact night-coverage at [email or web address], password [facility] 08/05/2012, 2:50 PM  LOS: 9 days

## 2012-08-05 NOTE — Progress Notes (Signed)
Assisted patient back to bed. Right foot dressing moderately draining. Old dressing removed and new dressing applied using sterile technique.  Right foot incision remains non-approximated with sutures intact. Surrounding tissue necrotic and previously noted; no changes. Pain medication given per order. Patient refuses to eat and appears depressed; encouragement provided. Will continue to monitor. Call bell near. Cindee Salt

## 2012-08-06 LAB — BASIC METABOLIC PANEL
BUN: 18 mg/dL (ref 6–23)
CO2: 25 mEq/L (ref 19–32)
Calcium: 8.4 mg/dL (ref 8.4–10.5)
Chloride: 96 mEq/L (ref 96–112)
Creatinine, Ser: 1.38 mg/dL — ABNORMAL HIGH (ref 0.50–1.35)
GFR calc Af Amer: 62 mL/min — ABNORMAL LOW (ref >90)
GFR calc non Af Amer: 53 mL/min — ABNORMAL LOW (ref >90)
Glucose, Bld: 222 mg/dL — ABNORMAL HIGH (ref 70–99)
Potassium: 5.2 mEq/L — ABNORMAL HIGH (ref 3.5–5.1)
Sodium: 130 mEq/L — ABNORMAL LOW (ref 135–145)

## 2012-08-06 LAB — GLUCOSE, CAPILLARY
Glucose-Capillary: 160 mg/dL — ABNORMAL HIGH (ref 70–99)
Glucose-Capillary: 227 mg/dL — ABNORMAL HIGH (ref 70–99)
Glucose-Capillary: 161 mg/dL — ABNORMAL HIGH (ref 70–99)
Glucose-Capillary: 88 mg/dL (ref 70–99)

## 2012-08-06 LAB — POTASSIUM: Potassium: 4.2 mEq/L (ref 3.5–5.1)

## 2012-08-06 LAB — CULTURE, BLOOD (ROUTINE X 2): Culture: NO GROWTH

## 2012-08-06 MED ORDER — SODIUM POLYSTYRENE SULFONATE 15 GM/60ML PO SUSP
15.0000 g | Freq: Once | ORAL | Status: AC
  Administered 2012-08-06: 15 g via ORAL
  Filled 2012-08-06: qty 60

## 2012-08-06 MED ORDER — FLUCONAZOLE 200 MG PO TABS
200.0000 mg | ORAL_TABLET | Freq: Once | ORAL | Status: AC
  Administered 2012-08-06: 200 mg via ORAL
  Filled 2012-08-06: qty 1

## 2012-08-06 NOTE — Progress Notes (Signed)
Patient saturated in urine. Incontinent and has frequent accidents with urine. Patient bathed with gown and linens changed. Assisted out of bed to bedside chair x 2 RNs. Call bell near. Visitors at bedside. Cindee Salt

## 2012-08-06 NOTE — Progress Notes (Signed)
[facility] PROGRESS NOTE  [name] [id number] DOB: August 27, 1950 DOA: 07/27/2012 PCP: No PCP Per Patient  Brief narrative:  62 year old male with history of diabetes mellitus ( was on oral hypoglycemics until one year back and stopped taking his meds), CAD status post CABG X4 in 2008 history of CVA, history of tobacco and alcohol use presented to the ED after a mechanical fall at home and hurt his neck. Also complained of pain over his right toe that he has noticed since 3 days.patient noted to have gangrene of his right 3rd toe with sepsis and DKA. admitted to stepdown and transferred to telemetry .   Assessment/Plan:   Severe sepsis  marked leucocytosis, elevated lactate secondary to gangrenous third right toe. X ray shows osteomyelitis. MRI rt foot shows osteomyelitis of right third toe with cellulitis of foot.  Empirically covering with IV vanco and zosyn. Blood cx so far no growth .  Appreciate ortho recommendations. Third Toe amputation done on 5/24. However he still has fever and other toe appear necrotic. ABI ordered and per ortho . <0.9 in both legs. He underwent right foot transmetatarsal amputation.  Recommend drain for 24 hour and to see if the skin at the mid foot level survives. , if there is necrosis of the skin in the next 48 hours , he will need BKA. Discussed with the patient.   Pain control    DKA (diabetic ketoacidoses)  In the setting of sepsis and severe non compliance  Started on insulin drip on admission.AG closed and placed on levemir. A1C of 10.9.  Increased levemir dose and add premeal aspart  CBG (last 3)   Recent Labs  08/06/12 0556 08/06/12 1207 08/06/12 1616  GLUCAP 227* 161* 172*   cbg's better controlled.   H/O: CVA (cerebrovascular accident)  No residual weakness. continue ASA   Hypertension  Not on any BP meds. On perioperative BB.   CAD S/P CABG x 4 in 2008  informs seeing Dr Terrence Dupont infrequently. Discussed with him on 5/22. No  further recommendations   Anemia / anemia of BLOOD loss from the foot:  Low MCV noted on iron panel. No recent blood work. Drop in H&H noted. No gross GI bleed. Stool for occult blood pending. A total of 4 units of prbc was given to the patient.  His H&h  Today is 8.4/24.4, if his H&h drops to less than 8 we will transfuse him.   Tobacco abuse/ Alcohol abuse  Reports to have quit   Low urine output on 5/27 Check bladder scan with almost 400 cc residue. I/O last 3 completed shifts: In: 1140 [P.O.:240; I.V.:900] Out: 2375 [Urine:2375] Total I/O In: 240 [P.O.:240] Out: 700 [Urine:700]  Hyponatremia and worsening of renal insufficiency: probably from dehydration. Wills tart him on IV normal saline and repeat labs in am. Hyponatremia improved. But his creatinine appears to stabilized at 1.4.   Fever despite being on antibiotics:  His fevers have come down. blood cultures ordered. CXR does not show pneumonia  And UA appears abnormal. Urine cultures are sent. And showed yeast. Will give him a dose of diflucan.  And removed foley catheter.   Leukocytosis: not improving much. Probably from the gangrenous foot. procalcitonin elevated. Will call ID consult in am , if repeat CBC shows persistent elevated leukocytosis.    Hyperkalemia: unclear etiology. Will give a dose of kayexalate and check potassium tonight. No chest pain.   DVT prophylaxis  Sq lovenox   Diet: Diabetic   Code Status:full  Family Communication:none at bedside  Disposition Plan: possibly snf when bed available.     Consultants:  Ortho ( Dr Berenice Primas)  Procedures:  Rt third toe MTP amputation on 5/24 .   Antibiotics:  IV vnaco / zosyn: 5/22>>   HPI/Subjective:  Frustrated that he might need surgery int he future.   Objective: Filed Vitals:   08/05/12 1957 08/06/12 0458 08/06/12 1026 08/06/12 1538  BP: 180/77 164/60 162/61 164/62  Pulse: 88 85 79 78  Temp: 99.7 F (37.6 C) 99.8 F (37.7 C)  99.6 F (37.6 C)   TempSrc: Oral Oral  Oral  Resp: 18 16  18   Height:      Weight:      SpO2: 97% 100%  97%    Intake/Output Summary (Last 24 hours) at 08/06/12 1630 Last data filed at 08/06/12 1539  Gross per 24 hour  Intake    240 ml  Output   1300 ml  Net  -1060 ml   Filed Weights   07/28/12 0600 07/29/12 0036 07/31/12 0411  Weight: 82.2 kg (181 lb 3.5 oz) 84 kg (185 lb 3 oz) 88.9 kg (195 lb 15.8 oz)    Exam: General: middle aged male in NAD  HEENT: no pallor, moist mucosa  Cardiovascular: S1&S2 normal, 3/6 systolic murmur  Respiratory: clear b/l, no added sounds  Abdomen: soft, NT, ND, BS+  Musculoskeletal:right foot dressing. Trace edema over right foot extending to distal tibia . Other toes appears necrotic as well CNS: AAOX3      Data Reviewed: Basic Metabolic Panel:  Recent Labs Lab 08/02/12 0852 08/03/12 0455 08/04/12 0425 08/05/12 0348 08/06/12 0430  NA 131* 127* 133* 134* 130*  K 3.7 4.1 4.5 4.8 5.2*  CL 97 93* 98 99 96  CO2 22 22 28 27 25   GLUCOSE 92 190* 90 89 222*  BUN 24* 24* 19 19 18   CREATININE 1.25 1.38* 1.40* 1.40* 1.38*  CALCIUM 8.2* 8.1* 8.2* 8.2* 8.4   Liver Function Tests: No results found for this basename: AST, ALT, ALKPHOS, BILITOT, PROT, ALBUMIN,  in the last 168 hours No results found for this basename: LIPASE, AMYLASE,  in the last 168 hours No results found for this basename: AMMONIA,  in the last 168 hours CBC:  Recent Labs Lab 07/31/12 0400 08/01/12 0520 08/02/12 0852 08/03/12 0455 08/05/12 0348  WBC 25.8* 23.3* 27.0* 25.7* 25.6*  HGB 9.1* 8.2* 10.2* 9.5* 8.4*  HCT 25.7* 24.0* 29.8* 27.3* 24.4*  MCV 76.7* 77.9* 80.3 78.2 81.6  PLT 332 333 331 363 431*   Cardiac Enzymes: No results found for this basename: CKTOTAL, CKMB, CKMBINDEX, TROPONINI,  in the last 168 hours BNP (last 3 results) No results found for this basename: PROBNP,  in the last 8760 hours CBG:  Recent Labs Lab 08/05/12 1644 08/05/12 2056 08/06/12 0556  08/06/12 1207 08/06/12 1616  GLUCAP 159* 160* 227* 161* 172*    Recent Results (from the past 240 hour(s))  CULTURE, BLOOD (ROUTINE X 2)     Status: None   Collection Time    07/27/12  4:40 PM      Result Value Range Status   Specimen Description BLOOD LEFT ANTECUBITAL   Final   Special Requests BOTTLES DRAWN AEROBIC ONLY 4CC   Final   Culture  Setup Time 07/27/2012 21:39   Final   Culture NO GROWTH 5 DAYS   Final   Report Status 08/02/2012 FINAL   Final  CULTURE, BLOOD (ROUTINE X 2)  Status: None   Collection Time    07/27/12  4:50 PM      Result Value Range Status   Specimen Description BLOOD RIGHT HAND   Final   Special Requests BOTTLES DRAWN AEROBIC ONLY 1CC   Final   Culture  Setup Time 07/27/2012 21:38   Final   Culture NO GROWTH 5 DAYS   Final   Report Status 08/02/2012 FINAL   Final  URINE CULTURE     Status: None   Collection Time    07/27/12  5:10 PM      Result Value Range Status   Specimen Description URINE, CLEAN CATCH   Final   Special Requests NONE   Final   Culture  Setup Time 07/27/2012 21:57   Final   Colony Count NO GROWTH   Final   Culture NO GROWTH   Final   Report Status 07/28/2012 FINAL   Final  MRSA PCR SCREENING     Status: None   Collection Time    07/27/12  9:17 PM      Result Value Range Status   MRSA by PCR NEGATIVE  NEGATIVE Final   Comment:            The GeneXpert MRSA Assay (FDA     approved for NASAL specimens     only), is one component of a     comprehensive MRSA colonization     surveillance program. It is not     intended to diagnose MRSA     infection nor to guide or     monitor treatment for     MRSA infections.  CULTURE, BLOOD (ROUTINE X 2)     Status: None   Collection Time    07/31/12  2:37 PM      Result Value Range Status   Specimen Description BLOOD RIGHT ARM   Final   Special Requests BOTTLES DRAWN AEROBIC ONLY 10CC   Final   Culture  Setup Time 07/31/2012 23:11   Final   Culture NO GROWTH 5 DAYS   Final    Report Status 08/06/2012 FINAL   Final  CULTURE, BLOOD (ROUTINE X 2)     Status: None   Collection Time    07/31/12  3:15 PM      Result Value Range Status   Specimen Description BLOOD RIGHT ARM   Final   Special Requests BOTTLES DRAWN AEROBIC AND ANAEROBIC 10CC   Final   Culture  Setup Time 07/31/2012 23:12   Final   Culture NO GROWTH 5 DAYS   Final   Report Status 08/06/2012 FINAL   Final  URINE CULTURE     Status: None   Collection Time    08/01/12  6:07 PM      Result Value Range Status   Specimen Description URINE, CATHETERIZED   Final   Special Requests NONE   Final   Culture  Setup Time 08/01/2012 18:48   Final   Colony Count NO GROWTH   Final   Culture NO GROWTH   Final   Report Status 08/02/2012 FINAL   Final  CULTURE, BLOOD (ROUTINE X 2)     Status: None   Collection Time    08/04/12 10:00 AM      Result Value Range Status   Specimen Description BLOOD RIGHT ARM   Final   Special Requests BOTTLES DRAWN AEROBIC AND ANAEROBIC 10CC   Final   Culture  Setup Time 08/04/2012 15:19   Final  Culture     Final   Value:        BLOOD CULTURE RECEIVED NO GROWTH TO DATE CULTURE WILL BE HELD FOR 5 DAYS BEFORE ISSUING A FINAL NEGATIVE REPORT   Report Status PENDING   Incomplete  CULTURE, BLOOD (ROUTINE X 2)     Status: None   Collection Time    08/04/12 10:05 AM      Result Value Range Status   Specimen Description BLOOD RIGHT ARM   Final   Special Requests BOTTLES DRAWN AEROBIC AND ANAEROBIC 10CC   Final   Culture  Setup Time 08/04/2012 15:20   Final   Culture     Final   Value:        BLOOD CULTURE RECEIVED NO GROWTH TO DATE CULTURE WILL BE HELD FOR 5 DAYS BEFORE ISSUING A FINAL NEGATIVE REPORT   Report Status PENDING   Incomplete  URINE CULTURE     Status: None   Collection Time    08/04/12 11:32 AM      Result Value Range Status   Specimen Description URINE, CATHETERIZED   Final   Special Requests NONE   Final   Culture  Setup Time 08/04/2012 19:33   Final   Colony Count  >=100,000 COLONIES/ML   Final   Culture YEAST   Final   Report Status 08/05/2012 FINAL   Final     Studies: No results found.  Scheduled Meds: . aspirin  81 mg Oral Daily  . enoxaparin (LOVENOX) injection  40 mg Subcutaneous Q24H  . famotidine  10 mg Oral BID  . insulin aspart  0-15 Units Subcutaneous TID WC  . insulin aspart  3 Units Subcutaneous TID WC  . insulin detemir  25 Units Subcutaneous Q24H  . metoprolol tartrate  25 mg Oral BID  . piperacillin-tazobactam (ZOSYN)  IV  3.375 g Intravenous Q8H  . polyethylene glycol  17 g Oral Daily  . sodium chloride  3 mL Intravenous Q12H  . vancomycin  750 mg Intravenous Q12H   Continuous Infusions: . sodium chloride 20 mL/hr at 07/31/12 1343  . sodium chloride 75 mL/hr at 08/04/12 2146      Time spent:35 minutes    [facility]  [facility] Pager [phone]. If 7PM-7AM, please contact night-coverage at [email or web address], password [facility] 08/06/2012, 4:30 PM  LOS: 10 days

## 2012-08-06 NOTE — Progress Notes (Signed)
Subjective: 4 Days Post-Op Procedure(s) (LRB): right transmetatarsal amputation (Right) Patient reports pain as 2 on 0-10 scale.    Objective: Vital signs in last 24 hours: Temp:  [99.7 F (37.6 C)-99.8 F (37.7 C)] 99.8 F (37.7 C) (06/01 0458) Pulse Rate:  [79-88] 79 (06/01 1026) Resp:  [16-18] 16 (06/01 0458) BP: (162-180)/(60-77) 162/61 mmHg (06/01 1026) SpO2:  [97 %-100 %] 100 % (06/01 0458)  Intake/Output from previous day: 05/31 0701 - 06/01 0700 In: 240 [P.O.:240] Out: 1525 [Urine:1525] Intake/Output this shift: Total I/O In: 240 [P.O.:240] Out: 400 [Urine:400]   Recent Labs  08/05/12 0348  HGB 8.4*    Recent Labs  08/05/12 0348  WBC 25.6*  RBC 2.99*  HCT 24.4*  PLT 431*    Recent Labs  08/05/12 0348 08/06/12 0430  NA 134* 130*  K 4.8 5.2*  CL 99 96  CO2 27 25  BUN 19 18  CREATININE 1.40* 1.38*  GLUCOSE 89 222*  CALCIUM 8.2* 8.4   Right foot exam: Incision clean with mild drainage. Still has avascular skin on dorsolateral aspect of foot. No redness into ankle  Assessment/Plan: 4 Days Post-Op Procedure(s) (LRB): right transmetatarsal amputation (Right) Plan: Dressing changed. Have ordered daily dry dressing changes by nursing staff.   Sheldon G 08/06/2012, 2:58 PM

## 2012-08-07 LAB — GLUCOSE, CAPILLARY
Glucose-Capillary: 65 mg/dL — ABNORMAL LOW (ref 70–99)
Glucose-Capillary: 91 mg/dL (ref 70–99)
Glucose-Capillary: 147 mg/dL — ABNORMAL HIGH (ref 70–99)

## 2012-08-07 LAB — CBC
HCT: 24.5 % — ABNORMAL LOW (ref 39.0–52.0)
Hemoglobin: 8.2 g/dL — ABNORMAL LOW (ref 13.0–17.0)
MCV: 82.8 fL (ref 78.0–100.0)
RBC: 2.96 MIL/uL — ABNORMAL LOW (ref 4.22–5.81)
RDW: 16.7 % — ABNORMAL HIGH (ref 11.5–15.5)
WBC: 21.2 10*3/uL — ABNORMAL HIGH (ref 4.0–10.5)

## 2012-08-07 LAB — BASIC METABOLIC PANEL
CO2: 25 mEq/L (ref 19–32)
Chloride: 100 mEq/L (ref 96–112)
Creatinine, Ser: 1.35 mg/dL (ref 0.50–1.35)
GFR calc Af Amer: 63 mL/min — ABNORMAL LOW (ref >90)
Potassium: 4.1 mEq/L (ref 3.5–5.1)

## 2012-08-07 LAB — VANCOMYCIN, TROUGH: Vancomycin Tr: 33.1 ug/mL (ref 10.0–20.0)

## 2012-08-07 MED ORDER — VANCOMYCIN HCL IN DEXTROSE 1-5 GM/200ML-% IV SOLN
1000.0000 mg | INTRAVENOUS | Status: DC
  Administered 2012-08-08 – 2012-08-11 (×4): 1000 mg via INTRAVENOUS
  Filled 2012-08-07 (×4): qty 200

## 2012-08-07 MED ORDER — HYDRALAZINE HCL 25 MG PO TABS
25.0000 mg | ORAL_TABLET | Freq: Three times a day (TID) | ORAL | Status: DC
  Administered 2012-08-07 – 2012-08-09 (×6): 25 mg via ORAL
  Filled 2012-08-07 (×10): qty 1

## 2012-08-07 NOTE — Progress Notes (Signed)
Physical Therapy Treatment Patient Details Name: [name] MRN: [id number] DOB: 1950/11/09 Today's Date: 08/07/2012 Time: [id number] PT Time Calculation (min): 24 min  PT Assessment / Plan / Recommendation Comments on Treatment Session  Patient very fatigued, desired to get back into bed. Patient tolerated ther-ex well. Instructed in positioning and elevation for RLE. Will continue to monitor and progress activity as tolerated.     Follow Up Recommendations  SNF     Does the patient have the potential to tolerate intense rehabilitation     Barriers to Discharge        Equipment Recommendations  Rolling walker with 5" wheels    Recommendations for Other Services Other (comment) (DARKO SHOE)  Frequency Min 3X/week   Plan Discharge plan needs to be updated    Precautions / Restrictions Precautions Precautions: Fall Required Braces or Orthoses: Other Brace/Splint (DARCO shoe) Restrictions Weight Bearing Restrictions: Yes RLE Weight Bearing: Partial weight bearing   Pertinent Vitals/Pain Patient reports no pain at this time    Mobility  Bed Mobility Bed Mobility: Sit to Supine;Scooting to HOB Supine to Sit: 4: Min assist Details for Bed Mobility Assistance: VCs for safe transition Transfers Transfers: Sit to Stand;Stand to Sit;Stand Pivot Transfers Sit to Stand: 4: Min assist Stand to Sit: 4: Min assist Stand Pivot Transfers: 4: Min assist Details for Transfer Assistance: Assist for stability, VCs for weight bearing restrictions Ambulation/Gait Ambulation/Gait Assistance: 4: Min assist Ambulation Distance (Feet): 6 Feet Assistive device: Rolling walker Ambulation/Gait Assistance Details: Patient with increased WBing through RLE,  Gait Pattern: Step-to pattern;Decreased stance time - right;Shuffle;Trunk flexed;Narrow base of support Gait velocity: decreased due to pain Stairs: No    Exercises General Exercises - Lower Extremity Ankle Circles/Pumps: Both;10 reps Long  Arc Quad: Both;10 reps Hip ABduction/ADduction: Both;10 reps Straight Leg Raises: Both;10 reps Hip Flexion/Marching: Both;10 reps    PT Goals Acute Rehab PT Goals PT Goal Formulation: With patient Time For Goal Achievement: 08/04/12 Potential to Achieve Goals: Good Pt will go Supine/Side to Sit: with modified independence PT Goal: Supine/Side to Sit - Progress: Progressing toward goal Pt will go Sit to Stand: with modified independence PT Goal: Sit to Stand - Progress: Progressing toward goal Pt will go Stand to Sit: with modified independence PT Goal: Stand to Sit - Progress: Progressing toward goal Pt will Ambulate: >150 feet;with modified independence;with rolling walker PT Goal: Ambulate - Progress: Progressing toward goal  Visit Information  Last PT Received On: 08/07/12 Assistance Needed: +2    Subjective Data  Subjective: My son was here to help me shave yesterday Patient Stated Goal: to go home   Cognition  Cognition Arousal/Alertness: Awake/alert Behavior During Therapy: Flat affect Overall Cognitive Status: History of cognitive impairments - at baseline (? related to ETOH)    Balance  Static Sitting Balance Static Sitting - Balance Support: Feet supported Static Sitting - Level of Assistance: 6: Modified independent (Device/Increase time)  End of Session PT - End of Session Equipment Utilized During Treatment: Gait belt Activity Tolerance: Patient limited by fatigue;Patient limited by pain Patient left: in bed;with call bell/phone within reach Nurse Communication: Mobility status   GP     Duncan Dull 08/07/2012, 3:00 PM Alben Deeds, [place] DPT  [phone]

## 2012-08-07 NOTE — Progress Notes (Signed)
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  No Known Allergies  Patient Measurements: Height: 5\' 11"  (180.3 cm) Weight: 195 lb 15.8 oz (88.9 kg) IBW/kg (Calculated) : 75.3  Vital Signs: Temp: 99.7 F (37.6 C) (06/02 1347) Temp src: Oral (06/02 1347) BP: 141/58 mmHg (06/02 1347) Pulse Rate: 79 (06/02 1347) Intake/Output from previous day: 06/01 0701 - 06/02 0700 In: 240 [P.O.:240] Out: 950 [Urine:950] Intake/Output from this shift: Total I/O In: 9208.6 [P.O.:730; I.V.:8478.6] Out: 425 [Urine:425]  Labs:  Recent Labs  08/05/12 0348 08/06/12 0430 08/07/12 0530  WBC 25.6*  --  21.2*  HGB 8.4*  --  8.2*  PLT 431*  --  516*  CREATININE 1.40* 1.38* 1.35   Estimated Creatinine Clearance: 60.4 ml/min (by C-G formula based on Cr of 1.35).  Recent Labs  08/07/12 0530 08/07/12 1543  VANCOTROUGH 33.1* 24.5*     Microbiology:   5/22 blood cultures x 2 negative; 5/30 blood cultures x 2 - negative to date   5/30 - yeast in urine - Fluconazole 200 mg PO x 1 given  Assessment:  Day # 10 Vancomycin and Zosyn.  POD # 4 right 2nd & 4th toes and  forefoot amputation, POD # 8 right 3rd toe amputation.   May need  BKA.        Currently on Vancomycin 750 mg IV q12hrs.  Tmax 99.7.  Creat 1.35.     Trough level this am = 33.1 mcg/ml, but dose given at 4:32am and trough drawn at 5:30am, so invalid.  A trough drawn correctly today at 1543 = 24.5 mcg/ml This level is above goal on vancomycin 750 mg IV q12h.   Goal of Therapy:  Vancomycin trough level 15-20 mcg/ml  Plan:    1. Change vancomycin to 1000 mg IV q24 with next dose due 6/3 at 06 am 2.  Continue Zosyn 3.375 grams IV q8hrs (each infused over 4 hours).   3. Bmet at least every 3 days while on Vancomycin. Eudelia Bunch, Pharm.D. [id number] 08/07/2012 5:45 PM vancd

## 2012-08-07 NOTE — Progress Notes (Signed)
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  No Known Allergies  Patient Measurements: Height: 5\' 11"  (180.3 cm) Weight: 195 lb 15.8 oz (88.9 kg) IBW/kg (Calculated) : 75.3  Vital Signs: Temp: 98.5 F (36.9 C) (06/02 0502) Temp src: Oral (06/02 0502) BP: 170/68 mmHg (06/02 0502) Pulse Rate: 76 (06/02 0502) Intake/Output from previous day: 06/01 0701 - 06/02 0700 In: 240 [P.O.:240] Out: 950 [Urine:950] Intake/Output from this shift: Total I/O In: 730 [P.O.:730] Out: 250 [Urine:250]  Labs:  Recent Labs  08/05/12 0348 08/06/12 0430 08/07/12 0530  WBC 25.6*  --  21.2*  HGB 8.4*  --  8.2*  PLT 431*  --  516*  CREATININE 1.40* 1.38* 1.35   Estimated Creatinine Clearance: 60.4 ml/min (by C-G formula based on Cr of 1.35).  Recent Labs  08/04/12 1451 08/07/12 0530  VANCOTROUGH 24.4* 33.1*     Microbiology:   5/22 blood cultures x 2 negative; 5/30 blood cultures x 2 - negative to date   5/30 - yeast in urine - Fluconazole 200 mg PO x 1 given  Assessment:  Day # 10 Vancomycin and Zosyn.  POD # 4 right 2nd & 4th toes and  forefoot amputation, POD # 8 right 3rd toe amputation.   May need  BKA.        Currently on Vancomycin 750 mg IV q12hrs.    Trough level this am = 33.1 mcg/ml, but dose given at 4:32am and trough drawn at 5:30am, so invalid. At goal for peak level. Initial trough level (5/25) was 16.9 mcg/ml on Vanc 1gram IV q12hrs, but subsequent trough level on 5/30 was 24.4 mcg/ml (above goal), and dose was decreased from 1 gram to 750 mg IV q12hrs.  BUN and creatinine have normalized.   Goal of Therapy:  Vancomycin trough level 15-20 mcg/ml  Plan:    Will recheck Vancomycin trough at 4pm today. Doses are currently scheduled at 4:30am/4:30pm.   Next dose will be held until Vancomycin trough level is evaluated.  Continue Zosyn 3.375 grams IV q8hrs (each infused over 4  hours).   Bmet at least every 3 days while on Vancomycin.  Arty Baumgartner, [place] Pager: [phone] 08/07/2012,12:54 PM

## 2012-08-07 NOTE — Clinical Documentation Improvement (Signed)
RENAL FAILURE DOCUMENTATION CLARIFICATION QUERY  THIS DOCUMENT IS NOT A PERMANENT PART OF THE MEDICAL RECORD  TO RESPOND TO THE THIS QUERY, FOLLOW THE INSTRUCTIONS BELOW:  1. If needed, update documentation for the patient's encounter via the notes activity.  2. Access this query again and click edit on the In [employer].  3. After updating, or not, click F2 to complete all highlighted (required) fields concerning your review. Select "additional documentation in the medical record" OR "no additional documentation provided".  4. Click Sign note button.  5. The deficiency will fall out of your In Basket *Please let us know if you are not able to complete this workflow by phone or e-mail (listed below).  Please update your documentation within the medical record to reflect your response to this query.                                                                                    08/07/12  Dear Joseph Berkshire,  In a better effort to capture your patient's severity of illness, reflect appropriate length of stay and utilization of resources, a review of the patient medical record has revealed the following indicators.    Based on your clinical judgment, please clarify and document in a progress note and/or discharge summary the clinical condition associated with the following supporting information:  In responding to this query please exercise your independent judgment.  The fact that a query is asked, does not imply that any particular answer is desired or expected.   Possible Clinical Conditions?   _______Acute Renal Failure  _______Acute Kidney Injury  _______Acute on Chronic Renal Failure  _______Acute Renal Insufficiency  _______Acute on Chronic Renal Insufficiency  _______Other Condition  _______Cannot Clinically Determine      Supporting Information:  Risk Factors: Worsening Renal Insufficiency probably from dehydration, will start IV Normal Saline noted per 6/01  progress notes.   Diagnostics:  Lab: Bun: 5/15:  12 5/22:  42 6/02:  12  Creat: 5/15:  1.05 5/22:  1.35 6/02:  1/35  GFR: 5/15:   86 5/22:   63 6/02:   63                 You may use possible, probable, or suspect with inpatient documentation. possible, probable, suspected diagnoses MUST be documented at the time of discharge  Reviewed: No additional documentation provided.  Thank You,  Theron Arista,  Clinical Documentation Specialist:  Phone: [place]

## 2012-08-07 NOTE — Progress Notes (Signed)
[facility] PROGRESS NOTE  [name] [id number] DOB: 1951-02-14 DOA: 07/27/2012 PCP: No PCP Per Patient  Brief narrative:  62 year old male with history of diabetes mellitus ( was on oral hypoglycemics until one year back and stopped taking his meds), CAD status post CABG X4 in 2008 history of CVA, history of tobacco and alcohol use presented to the ED after a mechanical fall at home and hurt his neck. Also complained of pain over his right toe that he has noticed since 3 days.patient noted to have gangrene of his right 3rd toe with sepsis and DKA. admitted to stepdown and transferred to telemetry .   Assessment/Plan:   Severe sepsis  marked leucocytosis, elevated lactate secondary to gangrenous third right toe. X ray shows osteomyelitis. MRI rt foot shows osteomyelitis of right third toe with cellulitis of foot.  Empirically covering with IV vanco and zosyn. Blood cx so far no growth .  Appreciate ortho recommendations. Third Toe amputation done on 5/24. However he still has fever and other toe appear necrotic. ABI ordered and per ortho . <0.9 in both legs. He underwent right foot transmetatarsal amputation.  Recommend drain for 24 hour and to see if the skin at the mid foot level survives. , if there is necrosis of the skin in the next 48 hours , he will need BKA. Discussed with the patient.   Pain control . Awaiting ortho recommendations for either BKA or discharge on antibiotics.    DKA (diabetic ketoacidoses)  In the setting of sepsis and severe non compliance  Started on insulin drip on admission.AG closed and placed on levemir. A1C of 10.9.  Increased levemir dose and add premeal aspart  CBG (last 3)   Recent Labs  08/07/12 0649 08/07/12 1130 08/07/12 1613  GLUCAP 91 147* 113*   cbg's better controlled.   H/O: CVA (cerebrovascular accident)  No residual weakness. continue ASA   Hypertension  Not on any BP meds. On perioperative BB.   CAD S/P CABG x 4 in 2008   informs seeing Dr Terrence Dupont infrequently. Discussed with him on 5/22. No further recommendations   Anemia / anemia of BLOOD loss from the foot:  Low MCV noted on iron panel. No recent blood work. Drop in H&H noted. No gross GI bleed. Stool for occult blood pending. A total of 4 units of prbc was given to the patient.  His H&h  Today is 8.4/24.4, if his H&h drops to less than 8 we will transfuse him.   Tobacco abuse/ Alcohol abuse  Reports to have quit   Low urine output on 5/27 Check bladder scan with almost 400 cc residue. I/O last 3 completed shifts: In: 240 [P.O.:240] Out: 1350 [Urine:1350] Total I/O In: 9208.6 [P.O.:730; I.V.:8478.6] Out: 425 [Urine:425]  Hyponatremia and worsening of renal insufficiency: probably from dehydration. Wills tart him on IV normal saline and repeat labs in am. Hyponatremia improved. But his creatinine appearsto be improving.  Fever despite being on antibiotics:  His fevers have come down. blood cultures ordered. CXR does not show pneumonia  And UA appears abnormal. Urine cultures are sent. And showed yeast. Will give him a dose of diflucan.  And removed foley catheter.   Leukocytosis:  improving . Probably from the gangrenous foot. procalcitonin elevated.  Hyperkalemia: unclear etiology. Resolved with kayexalate and repeat K NORMAL.  DVT prophylaxis  Sq lovenox   Diet: Diabetic   Code Status:full  Family Communication:none at bedside  Disposition Plan: possibly snf when bed available.  Consultants:  Ortho ( Dr Berenice Primas)  Procedures:  Rt third toe MTP amputation on 5/24 .   Antibiotics:  IV vnaco / zosyn: 5/22>>   HPI/Subjective:  Frustrated that he might need surgery int he future.   Objective: Filed Vitals:   08/06/12 1538 08/06/12 2018 08/07/12 0502 08/07/12 1347  BP: 164/62 150/57 170/68 141/58  Pulse: 78 78 76 79  Temp: 99.6 F (37.6 C) 99.6 F (37.6 C) 98.5 F (36.9 C) 99.7 F (37.6 C)  TempSrc: Oral Oral Oral Oral  Resp:  18 18 18 18   Height:      Weight:      SpO2: 97% 98% 99% 99%    Intake/Output Summary (Last 24 hours) at 08/07/12 1710 Last data filed at 08/07/12 1700  Gross per 24 hour  Intake 9208.58 ml  Output    425 ml  Net 8783.58 ml   Filed Weights   07/28/12 0600 07/29/12 0036 07/31/12 0411  Weight: 82.2 kg (181 lb 3.5 oz) 84 kg (185 lb 3 oz) 88.9 kg (195 lb 15.8 oz)    Exam: General: middle aged male in NAD  HEENT: no pallor, moist mucosa  Cardiovascular: S1&S2 normal, 3/6 systolic murmur  Respiratory: clear b/l, no added sounds  Abdomen: soft, NT, ND, BS+  Musculoskeletal:right foot dressing. Trace edema over right foot extending to distal tibia . Other toes appears necrotic as well CNS: AAOX3      Data Reviewed: Basic Metabolic Panel:  Recent Labs Lab 08/03/12 0455 08/04/12 0425 08/05/12 0348 08/06/12 0430 08/06/12 1901 08/07/12 0530  NA 127* 133* 134* 130*  --  135  K 4.1 4.5 4.8 5.2* 4.2 4.1  CL 93* 98 99 96  --  100  CO2 22 28 27 25   --  25  GLUCOSE 190* 90 89 222*  --  70  BUN 24* 19 19 18   --  12  CREATININE 1.38* 1.40* 1.40* 1.38*  --  1.35  CALCIUM 8.1* 8.2* 8.2* 8.4  --  8.7   Liver Function Tests: No results found for this basename: AST, ALT, ALKPHOS, BILITOT, PROT, ALBUMIN,  in the last 168 hours No results found for this basename: LIPASE, AMYLASE,  in the last 168 hours No results found for this basename: AMMONIA,  in the last 168 hours CBC:  Recent Labs Lab 08/01/12 0520 08/02/12 0852 08/03/12 0455 08/05/12 0348 08/07/12 0530  WBC 23.3* 27.0* 25.7* 25.6* 21.2*  HGB 8.2* 10.2* 9.5* 8.4* 8.2*  HCT 24.0* 29.8* 27.3* 24.4* 24.5*  MCV 77.9* 80.3 78.2 81.6 82.8  PLT 333 331 363 431* 516*   Cardiac Enzymes: No results found for this basename: CKTOTAL, CKMB, CKMBINDEX, TROPONINI,  in the last 168 hours BNP (last 3 results) No results found for this basename: PROBNP,  in the last 8760 hours CBG:  Recent Labs Lab 08/06/12 2103 08/07/12 0606  08/07/12 0649 08/07/12 1130 08/07/12 1613  GLUCAP 88 65* 91 147* 113*    Recent Results (from the past 240 hour(s))  CULTURE, BLOOD (ROUTINE X 2)     Status: None   Collection Time    07/31/12  2:37 PM      Result Value Range Status   Specimen Description BLOOD RIGHT ARM   Final   Special Requests BOTTLES DRAWN AEROBIC ONLY 10CC   Final   Culture  Setup Time 07/31/2012 23:11   Final   Culture NO GROWTH 5 DAYS   Final   Report Status 08/06/2012 FINAL   Final  CULTURE, BLOOD (ROUTINE X 2)     Status: None   Collection Time    07/31/12  3:15 PM      Result Value Range Status   Specimen Description BLOOD RIGHT ARM   Final   Special Requests BOTTLES DRAWN AEROBIC AND ANAEROBIC 10CC   Final   Culture  Setup Time 07/31/2012 23:12   Final   Culture NO GROWTH 5 DAYS   Final   Report Status 08/06/2012 FINAL   Final  URINE CULTURE     Status: None   Collection Time    08/01/12  6:07 PM      Result Value Range Status   Specimen Description URINE, CATHETERIZED   Final   Special Requests NONE   Final   Culture  Setup Time 08/01/2012 18:48   Final   Colony Count NO GROWTH   Final   Culture NO GROWTH   Final   Report Status 08/02/2012 FINAL   Final  CULTURE, BLOOD (ROUTINE X 2)     Status: None   Collection Time    08/04/12 10:00 AM      Result Value Range Status   Specimen Description BLOOD RIGHT ARM   Final   Special Requests BOTTLES DRAWN AEROBIC AND ANAEROBIC 10CC   Final   Culture  Setup Time 08/04/2012 15:19   Final   Culture     Final   Value:        BLOOD CULTURE RECEIVED NO GROWTH TO DATE CULTURE WILL BE HELD FOR 5 DAYS BEFORE ISSUING A FINAL NEGATIVE REPORT   Report Status PENDING   Incomplete  CULTURE, BLOOD (ROUTINE X 2)     Status: None   Collection Time    08/04/12 10:05 AM      Result Value Range Status   Specimen Description BLOOD RIGHT ARM   Final   Special Requests BOTTLES DRAWN AEROBIC AND ANAEROBIC 10CC   Final   Culture  Setup Time 08/04/2012 15:20   Final    Culture     Final   Value:        BLOOD CULTURE RECEIVED NO GROWTH TO DATE CULTURE WILL BE HELD FOR 5 DAYS BEFORE ISSUING A FINAL NEGATIVE REPORT   Report Status PENDING   Incomplete  URINE CULTURE     Status: None   Collection Time    08/04/12 11:32 AM      Result Value Range Status   Specimen Description URINE, CATHETERIZED   Final   Special Requests NONE   Final   Culture  Setup Time 08/04/2012 19:33   Final   Colony Count >=100,000 COLONIES/ML   Final   Culture YEAST   Final   Report Status 08/05/2012 FINAL   Final     Studies: No results found.  Scheduled Meds: . aspirin  81 mg Oral Daily  . enoxaparin (LOVENOX) injection  40 mg Subcutaneous Q24H  . famotidine  10 mg Oral BID  . hydrALAZINE  25 mg Oral Q8H  . insulin aspart  0-15 Units Subcutaneous TID WC  . insulin aspart  3 Units Subcutaneous TID WC  . insulin detemir  25 Units Subcutaneous Q24H  . metoprolol tartrate  25 mg Oral BID  . piperacillin-tazobactam (ZOSYN)  IV  3.375 g Intravenous Q8H  . polyethylene glycol  17 g Oral Daily  . sodium chloride  3 mL Intravenous Q12H  . vancomycin  750 mg Intravenous Q12H   Continuous Infusions: . sodium chloride 10 mL/hr at  08/07/12 1400  . sodium chloride 75 mL/hr at 08/07/12 1700      Time spent:35 minutes    Azuree Minish  [facility] Pager [phone]. If 7PM-7AM, please contact night-coverage at [email or web address], password [facility] 08/07/2012, 5:10 PM  LOS: 11 days

## 2012-08-07 NOTE — Clinical Social Work Note (Signed)
Clinical Social Worker provided bed offers to patient. Patient could not remember the name of the facility he is preferring. CSW will check back with him on which facility he is confirming.   [name] MSW, [place]

## 2012-08-07 NOTE — Progress Notes (Signed)
Patient ID: [name], male   DOB: 1950-05-08, 62 y.o.   MRN: [id number] Subjective: Spoke with and examine patient at 6:30 this morning and 3:30 this afternoon.  Because of continued necrosis at the midfoot.  I specifically recommended below-knee amputation.  Patient is aware that amputation is not accomplished now, it will certainly be accomplished later.  He is also aware of the risks of recurrent infection with ascending cellulitis.  It may pose risk to his left lower extremity and his life.  Objective: Patient's foot is examined and does have necrosis to the midfoot level, above the transmetatarsal amputation site.  This is both dorsally and plantarly, and the skin laterally also appears to be somewhat dusky.  The surgical wound from the transmetatarsal amputation is not healing suture line is intact.  There is moderate serosanguineous drainage and no odor.  His white blood cell count remains about 20,000.  Assessment: From my point of view, the foot is probably nonviable and below knee amputation has once again been recommended and discussed with the patient.  Plan: At this time the patient "wants no more cutting", and is not amenable to any further surgery.  I will discuss this with him again tomorrow morning at 06:30.  If he chooses against the surgery, discharged home on antibiotics with dressing changes at the wound center may hold him for a short period of time.  I believe that below-knee amputation will occur sooner than later because of the necrotic changes in his foot.

## 2012-08-08 DIAGNOSIS — E1165 Type 2 diabetes mellitus with hyperglycemia: Secondary | ICD-10-CM

## 2012-08-08 DIAGNOSIS — M869 Osteomyelitis, unspecified: Secondary | ICD-10-CM

## 2012-08-08 DIAGNOSIS — M9080 Osteopathy in diseases classified elsewhere, unspecified site: Secondary | ICD-10-CM

## 2012-08-08 LAB — CBC
Hemoglobin: 6.7 g/dL — CL (ref 13.0–17.0)
MCH: 27.1 pg (ref 26.0–34.0)
MCV: 83 fL (ref 78.0–100.0)
Platelets: 530 10*3/uL — ABNORMAL HIGH (ref 150–400)
RBC: 2.47 MIL/uL — ABNORMAL LOW (ref 4.22–5.81)
WBC: 20.4 10*3/uL — ABNORMAL HIGH (ref 4.0–10.5)

## 2012-08-08 LAB — BASIC METABOLIC PANEL
BUN: 11 mg/dL (ref 6–23)
CO2: 26 mEq/L (ref 19–32)
Chloride: 102 mEq/L (ref 96–112)
Creatinine, Ser: 1.35 mg/dL (ref 0.50–1.35)
GFR calc Af Amer: 63 mL/min — ABNORMAL LOW (ref >90)
Glucose, Bld: 61 mg/dL — ABNORMAL LOW (ref 70–99)
Potassium: 3.9 mEq/L (ref 3.5–5.1)

## 2012-08-08 LAB — IRON AND TIBC
Iron: 20 ug/dL — ABNORMAL LOW (ref 42–135)
TIBC: 131 ug/dL — ABNORMAL LOW (ref 215–435)

## 2012-08-08 LAB — RETICULOCYTES: Retic Count, Absolute: 85.1 10*3/uL (ref 19.0–186.0)

## 2012-08-08 LAB — HEPATIC FUNCTION PANEL
Albumin: 1.5 g/dL — ABNORMAL LOW (ref 3.5–5.2)
Alkaline Phosphatase: 168 U/L — ABNORMAL HIGH (ref 39–117)
Indirect Bilirubin: 0.7 mg/dL (ref 0.3–0.9)
Total Protein: 6.7 g/dL (ref 6.0–8.3)

## 2012-08-08 LAB — AMMONIA: Ammonia: 19 umol/L (ref 11–60)

## 2012-08-08 LAB — SEDIMENTATION RATE: Sed Rate: >140 mm/hr — ABNORMAL HIGH (ref 0–16)

## 2012-08-08 LAB — PREPARE RBC (CROSSMATCH)

## 2012-08-08 LAB — GLUCOSE, CAPILLARY: Glucose-Capillary: 116 mg/dL — ABNORMAL HIGH (ref 70–99)

## 2012-08-08 MED ORDER — IBUPROFEN 400 MG PO TABS
400.0000 mg | ORAL_TABLET | Freq: Once | ORAL | Status: AC
  Administered 2012-08-08: 400 mg via ORAL
  Filled 2012-08-08 (×2): qty 1

## 2012-08-08 MED ORDER — DEXTROSE 5 % IV SOLN
2.0000 g | INTRAVENOUS | Status: DC
  Administered 2012-08-08 – 2012-08-10 (×3): 2 g via INTRAVENOUS
  Filled 2012-08-08 (×4): qty 2

## 2012-08-08 MED ORDER — METRONIDAZOLE 500 MG PO TABS
500.0000 mg | ORAL_TABLET | Freq: Three times a day (TID) | ORAL | Status: DC
  Administered 2012-08-08 – 2012-08-09 (×3): 500 mg via ORAL
  Filled 2012-08-08 (×6): qty 1

## 2012-08-08 MED ORDER — DIPHENHYDRAMINE HCL 50 MG/ML IJ SOLN
12.5000 mg | Freq: Once | INTRAMUSCULAR | Status: AC
  Administered 2012-08-09: 12.5 mg via INTRAVENOUS
  Filled 2012-08-08: qty 1

## 2012-08-08 MED ORDER — ACETAMINOPHEN 325 MG PO TABS
650.0000 mg | ORAL_TABLET | Freq: Once | ORAL | Status: AC
  Administered 2012-08-08: 650 mg via ORAL

## 2012-08-08 NOTE — Progress Notes (Signed)
Patient ID: [name], male   DOB: [birth year], 62 y.o.   MRN: [id number] Subjective: Patient was seen and examined this morning around 0700 hrs.  He still does not desire any further surgical intervention and clearly understands that more likely than not his mid tarsal and dictation wound will not heal and that the necrosis going proximally may actually progress.  Objective: The suture line is intact, but there is little if any evidence of healing along the suture line and there is serosanguineous drainage.  The drainage is mild to moderate.  The area of necrosis going up the midfoot has pretty much demarcated at mid arch.  There is no odor coming from the wound.  Assessment: Status post-septic shock, which resulted in necrosis of the right foot from the toes to the middle of the arch, probably secondary to occlusion of small vessels in the foot.  Plan: the patient has been advised that it is unlikely the midfoot wound will heal, and that he will need below-knee amputation.  Mentally he is not prepared for that at this time and wants to do dressing changes and IV antibiotics.  That being said, I will need to see him back in approximately one week in my office for followup reevaluation at which point I'll probably once again removed recommend a below-knee amputation.

## 2012-08-08 NOTE — Progress Notes (Signed)
[facility] PROGRESS NOTE  [name] [id number] DOB: Jul 26, 1950 DOA: 07/27/2012 PCP: No PCP Per Patient  Brief narrative:  62 year old male with history of diabetes mellitus ( was on oral hypoglycemics until one year back and stopped taking his meds), CAD status post CABG X4 in 2008 history of CVA, history of tobacco and alcohol use presented to the ED after a mechanical fall at home and hurt his neck. Also complained of pain over his right toe that he has noticed since 3 days.patient noted to have gangrene of his right 3rd toe with sepsis and DKA. admitted to stepdown and transferred to telemetry . He underwent metatarsal amputation on 5/28 as his foot remained gangrenous. Plan was to watch his mid foot skin heal, if there is further necrosis he should get a BKA, but he is adamantly refusing to undergo any more surgery. He continues to spike fevers despite being on antibiotics for > 10 days. ID consulted and ortho recommends to follow outpatient in one week after discharge.   Assessment/Plan:   Severe sepsis  marked leucocytosis, elevated lactate secondary to gangrenous third right toe. X ray shows osteomyelitis. MRI rt foot shows osteomyelitis of right third toe with cellulitis of foot. Empirically covering with IV vanco and zosyn. Blood cx so far no growth .  Appreciate ortho recommendations. Third Toe amputation done on 5/24. However he still has fever and other toe appear necrotic. ABI ordered and per ortho . <0.9 in both legs. He underwent right foot transmetatarsal amputation on 5/28.  Recommend drain for 24 hour and to see if the skin at the mid foot level survives. , if there is necrosis of the skin in the next 48 hours , he will need BKA. Discussed with the patient.  Dr Mayer Camel recommends BKA, but patient is adamantly refusing any more surgery, reports he understands the risks of not undergoing BKA. The nursing staff reports him being very indifferent to his surroundings and does not  seem to have insight of his medical problems.  He has delayed response to questions, denies any hallucinations or delusions. He doenst appear to understand his whole medical situation. We will get psych evaluation to assist [place] to determine if he has capacity to make medical decisions and to see if he is depressed.     DKA (diabetic ketoacidoses)  In the setting of sepsis and severe non compliance  Started on insulin drip on admission. AG closed and placed on levemir. A1C of 10.9.  Increased levemir dose .  CBG (last 3)   Recent Labs  08/07/12 2140 08/08/12 0558 08/08/12 1139  GLUCAP 111* 82 116*   cbg's better controlled.   H/O: CVA (cerebrovascular accident)  No residual weakness. continue ASA   Hypertension  Not on any BP meds. On perioperative BB.   CAD S/P CABG x 4 in 2008  informs seeing Dr Terrence Dupont infrequently. Discussed with him on 5/22. No further recommendations   Anemia / anemia of BLOOD loss from the foot:  Normocytic. Drop in H&H noted. No gross GI bleed. Stool for occult blood pending. A total of 4 units of prbc was given to the patient, so far. His H&H today is 6.7/20.5 , we have no evidence of gross bleeding. We will get anemia panel. Order 2 more units of prbc and recheck H&H tomorrow.   Tobacco abuse/ Alcohol abuse  Reports to have quit   Low urine output on 5/27  I/O last 3 completed shifts: In: 1120 [P.O.:610; I.V.:510] Out: 1550 [  Urine:1550] Total I/O In: 120 [P.O.:120] Out: -    Hyponatremia and worsening of renal insufficiency: probably from dehydration. Started him on  IV normal saline and repeat labs in amshow improvement in sodium and creatinine.  Hyponatremia improved and his creatinine appears to be improving.   Fever despite being on antibiotics: probably from the foot infection. blood cultures ordered. CXR does not show pneumonia  And UA appears abnormal. Urine cultures are sent. And showed yeast. Will give him a dose of diflucan.  And removed  foley catheter.   Leukocytosis:  improving . Probably from the gangrenous foot. procalcitonin elevated.    Hyperkalemia: unclear etiology. Resolved with kayexalate and repeat K NORMAL.   DVT prophylaxis  Sq lovenox   Diet: Diabetic   Code Status:full  Family Communication:none at bedside  Disposition Plan: possibly snf when bed available and when stable.    Consultants:  Ortho ( Dr Berenice Primas)  Procedures:  Rt third toe MTP amputation on 5/24 . Right foot transmetatarsal amputation on 5/8   Antibiotics:  IV vnaco / zosyn: 5/22>>   HPI/Subjective:  Adamantly refusing more surgery.   Objective: Filed Vitals:   08/07/12 0502 08/07/12 1347 08/07/12 1942 08/08/12 0429  BP: 170/68 141/58 155/65 163/72  Pulse: 76 79 88 89  Temp: 98.5 F (36.9 C) 99.7 F (37.6 C) 100.3 F (37.9 C) 99.2 F (37.3 C)  TempSrc: Oral Oral Oral Oral  Resp: 18 18 18 18   Height:      Weight:      SpO2: 99% 99% 100% 97%    Intake/Output Summary (Last 24 hours) at 08/08/12 1407 Last data filed at 08/08/12 0800  Gross per 24 hour  Intake    750 ml  Output   1300 ml  Net   -550 ml   Filed Weights   07/28/12 0600 07/29/12 0036 07/31/12 0411  Weight: 82.2 kg (181 lb 3.5 oz) 84 kg (185 lb 3 oz) 88.9 kg (195 lb 15.8 oz)    Exam: General: middle aged male in NAD  HEENT: no pallor, moist mucosa  Cardiovascular: S1&S2 normal, 3/6 systolic murmur  Respiratory: clear b/l, no added sounds  Abdomen: soft, NT, ND, BS+  Musculoskeletal:right foot dressing. Trace edema over right foot extending to distal tibia . Other toes appears necrotic as well CNS: AAOX3      Data Reviewed: Basic Metabolic Panel:  Recent Labs Lab 08/04/12 0425 08/05/12 0348 08/06/12 0430 08/06/12 1901 08/07/12 0530 08/08/12 0445  NA 133* 134* 130*  --  135 136  K 4.5 4.8 5.2* 4.2 4.1 3.9  CL 98 99 96  --  100 102  CO2 28 27 25   --  25 26  GLUCOSE 90 89 222*  --  70 61*  BUN 19 19 18   --  12 11  CREATININE 1.40*  1.40* 1.38*  --  1.35 1.35  CALCIUM 8.2* 8.2* 8.4  --  8.7 8.5   Liver Function Tests:  Recent Labs Lab 08/08/12 1145  AST 26  ALT 16  ALKPHOS 168*  BILITOT 2.0*  PROT 6.7  ALBUMIN 1.5*   No results found for this basename: LIPASE, AMYLASE,  in the last 168 hours  Recent Labs Lab 08/08/12 1145  AMMONIA 19   CBC:  Recent Labs Lab 08/02/12 0852 08/03/12 0455 08/05/12 0348 08/07/12 0530 08/08/12 0920  WBC 27.0* 25.7* 25.6* 21.2* 20.4*  HGB 10.2* 9.5* 8.4* 8.2* 6.7*  HCT 29.8* 27.3* 24.4* 24.5* 20.5*  MCV 80.3 78.2 81.6  82.8 83.0  PLT 331 363 431* 516* 530*   Cardiac Enzymes: No results found for this basename: CKTOTAL, CKMB, CKMBINDEX, TROPONINI,  in the last 168 hours BNP (last 3 results) No results found for this basename: PROBNP,  in the last 8760 hours CBG:  Recent Labs Lab 08/07/12 1130 08/07/12 1613 08/07/12 2140 08/08/12 0558 08/08/12 1139  GLUCAP 147* 113* 111* 82 116*    Recent Results (from the past 240 hour(s))  CULTURE, BLOOD (ROUTINE X 2)     Status: None   Collection Time    07/31/12  2:37 PM      Result Value Range Status   Specimen Description BLOOD RIGHT ARM   Final   Special Requests BOTTLES DRAWN AEROBIC ONLY 10CC   Final   Culture  Setup Time 07/31/2012 23:11   Final   Culture NO GROWTH 5 DAYS   Final   Report Status 08/06/2012 FINAL   Final  CULTURE, BLOOD (ROUTINE X 2)     Status: None   Collection Time    07/31/12  3:15 PM      Result Value Range Status   Specimen Description BLOOD RIGHT ARM   Final   Special Requests BOTTLES DRAWN AEROBIC AND ANAEROBIC 10CC   Final   Culture  Setup Time 07/31/2012 23:12   Final   Culture NO GROWTH 5 DAYS   Final   Report Status 08/06/2012 FINAL   Final  URINE CULTURE     Status: None   Collection Time    08/01/12  6:07 PM      Result Value Range Status   Specimen Description URINE, CATHETERIZED   Final   Special Requests NONE   Final   Culture  Setup Time 08/01/2012 18:48   Final    Colony Count NO GROWTH   Final   Culture NO GROWTH   Final   Report Status 08/02/2012 FINAL   Final  CULTURE, BLOOD (ROUTINE X 2)     Status: None   Collection Time    08/04/12 10:00 AM      Result Value Range Status   Specimen Description BLOOD RIGHT ARM   Final   Special Requests BOTTLES DRAWN AEROBIC AND ANAEROBIC 10CC   Final   Culture  Setup Time 08/04/2012 15:19   Final   Culture     Final   Value:        BLOOD CULTURE RECEIVED NO GROWTH TO DATE CULTURE WILL BE HELD FOR 5 DAYS BEFORE ISSUING A FINAL NEGATIVE REPORT   Report Status PENDING   Incomplete  CULTURE, BLOOD (ROUTINE X 2)     Status: None   Collection Time    08/04/12 10:05 AM      Result Value Range Status   Specimen Description BLOOD RIGHT ARM   Final   Special Requests BOTTLES DRAWN AEROBIC AND ANAEROBIC 10CC   Final   Culture  Setup Time 08/04/2012 15:20   Final   Culture     Final   Value:        BLOOD CULTURE RECEIVED NO GROWTH TO DATE CULTURE WILL BE HELD FOR 5 DAYS BEFORE ISSUING A FINAL NEGATIVE REPORT   Report Status PENDING   Incomplete  URINE CULTURE     Status: None   Collection Time    08/04/12 11:32 AM      Result Value Range Status   Specimen Description URINE, CATHETERIZED   Final   Special Requests NONE   Final  Culture  Setup Time 08/04/2012 19:33   Final   Colony Count >=100,000 COLONIES/ML   Final   Culture YEAST   Final   Report Status 08/05/2012 FINAL   Final     Studies: No results found.  Scheduled Meds: . aspirin  81 mg Oral Daily  . cefTRIAXone (ROCEPHIN)  IV  2 g Intravenous Q24H  . enoxaparin (LOVENOX) injection  40 mg Subcutaneous Q24H  . famotidine  10 mg Oral BID  . hydrALAZINE  25 mg Oral Q8H  . insulin aspart  0-15 Units Subcutaneous TID WC  . insulin aspart  3 Units Subcutaneous TID WC  . insulin detemir  25 Units Subcutaneous Q24H  . metoprolol tartrate  25 mg Oral BID  . metroNIDAZOLE  500 mg Oral Q8H  . polyethylene glycol  17 g Oral Daily  . sodium chloride  3 mL  Intravenous Q12H  . vancomycin  1,000 mg Intravenous Q24H   Continuous Infusions: . sodium chloride 10 mL/hr at 08/07/12 1700  . sodium chloride 75 mL/hr at 08/07/12 1700      Time spent:35 minutes    Berneta Sconyers  [facility] Pager [phone]. If 7PM-7AM, please contact night-coverage at [email or web address], password [facility] 08/08/2012, 2:07 PM  LOS: 12 days

## 2012-08-08 NOTE — Progress Notes (Signed)
Occupational Therapy Treatment Patient Details Name: [name] MRN: [id number] DOB: 1950/10/19 Today's Date: 08/08/2012 Time: [id number] OT Time Calculation (min): 36 min  OT Assessment / Plan / Recommendation Comments on Treatment Session Upon arrival, pt unaware that he was sitting in urine soaked sheets with fecal material on sheets. Pt ambulated to sink to wash up and was only going to wash his face. Pt required mod redirectional cues throughout ADL to complete bathing activity. Poor insight/judgement, poor emergent awareness, poor mental flexibility noted throughout session. Do not feel pt is capable of taking care of himeself at this time. Pt less talkative and interactive than initial visits. Discussed ortho MD recommendation regarding BKA and pt responded that he "was tired of them cutting on him and treating him like a [place] pig". Rec SNF placement  for long term care, as pt does not demonstrate the ability to complete adequate basic self care needs at this time due to cognitive deficits. Will continue to follow acutely. discussed with nursing. rec psych consult..     Follow Up Recommendations  SNF    Barriers to Discharge       Equipment Recommendations  None recommended by OT    Recommendations for Other Services  Psych consult  Frequency Min 2X/week   Plan Discharge plan remains appropriate    Precautions / Restrictions Precautions Precautions: Fall Precaution Comments: cognitive deficits Other Brace/Splint: Darco shoe Restrictions Weight Bearing Restrictions: Yes RLE Weight Bearing: Partial weight bearing   Pertinent Vitals/Pain no apparent distress     ADL  Grooming: Set up Where Assessed - Grooming: Unsupported standing Upper Body Bathing: Set up Where Assessed - Upper Body Bathing: Unsupported sit to stand Lower Body Bathing: Minimal assistance Where Assessed - Lower Body Bathing: Unsupported sit to stand Lower Body Dressing: Moderate assistance (total A to  donn Darco shoe) Where Assessed - Lower Body Dressing: Unsupported sit to stand Equipment Used: Gait belt;Rolling walker Transfers/Ambulation Related to ADLs: Min A from lower chair ADL Comments: Requires increased time for processing    OT Diagnosis:    OT Problem List:   OT Treatment Interventions:     OT Goals Acute Rehab OT Goals OT Goal Formulation: With patient Potential to Achieve Goals: Good ADL Goals Pt Will Perform Lower Body Bathing: with modified independence;Sit to stand from chair;Unsupported;with adaptive equipment ADL Goal: Lower Body Bathing - Progress: Progressing toward goals Pt Will Perform Lower Body Dressing: with modified independence;Sit to stand from chair;Unsupported;with adaptive equipment ADL Goal: Lower Body Dressing - Progress: Progressing toward goals Pt Will Transfer to Toilet: with modified independence;Ambulation;3-in-1 ADL Goal: Toilet Transfer - Progress: Progressing toward goals Pt Will Perform Toileting - Clothing Manipulation: with modified independence;Standing ADL Goal: Toileting - Clothing Manipulation - Progress: Progressing toward goals Pt Will Perform Toileting - Hygiene: with modified independence;Standing at 3-in-1/toilet ADL Goal: Toileting - Hygiene - Progress: Progressing toward goals Additional ADL Goal #1: independent with donning/doffing R Darco shoe ADL Goal: Additional Goal #1 - Progress: Progressing toward goals Additional ADL Goal #2: mod I with tiem mgnt during functional task @ RW level. ADL Goal: Additional Goal #2 - Progress: Discontinued (comment)  Visit Information  Last OT Received On: 08/08/12 Assistance Needed: +1    Subjective Data      Prior Functioning       Cognition  Cognition Arousal/Alertness: Awake/alert Behavior During Therapy: Flat affect Overall Cognitive Status: Impaired/Different from baseline Area of Impairment: Orientation;Attention;Memory;Following commands;Safety/judgement;Awareness;Problem  solving Orientation Level: Disoriented to;Time Current Attention Level: Focused Memory:  Decreased short-term memory Following Commands: Follows one step commands with increased time Safety/Judgement: Decreased awareness of safety;Decreased awareness of deficits Awareness: Emergent Problem Solving: Slow processing;Decreased initiation;Difficulty sequencing;Requires verbal cues General Comments: appears more depressed today. discussed current situation with MD recommending amputation. Pt staes that he is "not having anymore cutting" Pt with poor initiation, requiring 3 minutes to stand. Poor mental flexibility. Poor judgement/insight.    Mobility  Bed Mobility Bed Mobility: Supine to Sit;Sitting - Scoot to Edge of Bed Rolling Right: 5: Supervision Right Sidelying to Sit: 5: Supervision Supine to Sit: 5: Supervision Details for Bed Mobility Assistance: increased time Transfers Transfers: Sit to Stand;Stand to Sit Sit to Stand: 4: Min assist;With upper extremity assist;From chair/3-in-1 Stand to Sit: 4: Min guard;To bed;With upper extremity assist Details for Transfer Assistance: vc for safety/hand placemetn    Exercises      Balance Balance Balance Assessed: Yes Static Sitting Balance Static Sitting - Balance Support: No upper extremity supported;Feet supported Static Sitting - Level of Assistance: 4: Min assist Dynamic Standing Balance Dynamic Standing - Balance Support: No upper extremity supported;During functional activity Dynamic Standing - Level of Assistance: 4: Min assist   End of Session OT - End of Session Equipment Utilized During Treatment: Gait belt Activity Tolerance: Patient tolerated treatment well Patient left: in bed;with call bell/phone within reach;with bed alarm set Nurse Communication: Mobility status;Other (comment) (concerns over cognitive status)  GO     Virginia Curl,HILLARY 08/08/2012, 12:01 PM [facility], OTR/L  [phone] 08/08/2012

## 2012-08-08 NOTE — Progress Notes (Signed)
Pt's temp has decreased to 98.4 oral. The nurse contacts [name] to inquire into whether or not she wants the nurse to acknowledge the Transfuse blood order and administer. [name] instructed the nurse to administer the PRBC. [name] wrote an order for Benadryl prior to the administration. The nurse performed as was instructed. [name]

## 2012-08-08 NOTE — Progress Notes (Signed)
MD notified, Pt fever increasing after administration of 650 mg of Tylenol. Orders given to hold off on PICC line placement until tomorrow. Orders given to hold off on blood administration until fever comes down, will continue to monitor. Orders given for Motrin PO, will administer and continue to monitor pt.

## 2012-08-08 NOTE — Clinical Social Work Note (Signed)
CSW provided current bed offers to pt who has accepted offer from [facility]. Pt with delayed response to questions, but answered questions appropriately. Anticipate d/c Wed or Thurs. Will continue to follow for SNF placement.  Wandra Feinstein, MSW, LCSWA [phone] (Coverage)

## 2012-08-08 NOTE — Consult Note (Addendum)
[facility]  Total days of antibiotics 13        Day13 vanco        Day 13 piptazo       Reason for Consult: osteo    Referring Physician: Karleen Hampshire  Principal Problem:   DKA (diabetic ketoacidoses) Active Problems:   Sepsis   Gangrene of toe   S/P CABG x 4   H/O: CVA (cerebrovascular accident)   Tobacco abuse   Alcohol abuse   Hypertension   Anemia   DM (diabetes mellitus) type 2, uncontrolled, with ketoacidosis    HPI: [name] is a 62 y.o. male  With HTN, poorly controlled DM c/b microvascular disease was Admitted roughly 2 wks ago with  severe diabetic ketoacidosis with a glucose of 600 a high lactic acid and obvious necrosis of the right third toe c/w dry gangrenous foot .MRI scan was consistent with osteo-the distal phalanx of the third toe . He underwent right third toe amputation for necrosis of the third toe at the MTP joint going distally but eventually underwent a TM amputation of the right foot  On 5/28 since his wound was poorly healing and further necrosis of remaining toes was occurring. Since 5/28 the wound flap is showing concerns of poor healing and surgeons have recommended for the patient to undergo a BKA. He is apprehensive for further surgery and would like some time with antibiotics to see if it can improve. The patient has been on vanco and piptazo for 13 days as of today. No cultures sent from the OR to determine pathogen.    Past Medical History  Diagnosis Date  . Hypertension   . Diabetes mellitus without complication     Allergies: No Known Allergies  MEDICATIONS: . aspirin  81 mg Oral Daily  . enoxaparin (LOVENOX) injection  40 mg Subcutaneous Q24H  . famotidine  10 mg Oral BID  . hydrALAZINE  25 mg Oral Q8H  . insulin aspart  0-15 Units Subcutaneous TID WC  . insulin aspart  3 Units Subcutaneous TID WC  . insulin detemir  25 Units Subcutaneous Q24H  . metoprolol tartrate  25 mg Oral BID  . piperacillin-tazobactam  (ZOSYN)  IV  3.375 g Intravenous Q8H  . polyethylene glycol  17 g Oral Daily  . sodium chloride  3 mL Intravenous Q12H  . vancomycin  1,000 mg Intravenous Q24H    History  Substance Use Topics  . Smoking status: Never Smoker   . Smokeless tobacco: Not on file  . Alcohol Use: No    No family history on file.  Review of Systems  Constitutional: Negative for fever, chills, diaphoresis, activity change, appetite change, fatigue and unexpected weight change.  HENT: Negative for congestion, sore throat, rhinorrhea, sneezing, trouble swallowing and sinus pressure.  Eyes: Negative for photophobia and visual disturbance.  Respiratory: Negative for cough, chest tightness, shortness of breath, wheezing and stridor.  Cardiovascular: Negative for chest pain, palpitations and leg swelling.  Gastrointestinal: Negative for nausea, vomiting, abdominal pain, diarrhea, constipation, blood in stool, abdominal distention and anal bleeding.  Genitourinary: Negative for dysuria, hematuria, flank pain and difficulty urinating.  Musculoskeletal: Negative for myalgias, back pain, joint swelling, arthralgias and gait problem.  Skin: Negative for color change, pallor, rash and wound.  Neurological: Negative for dizziness, tremors, weakness and light-headedness.  Hematological: Negative for adenopathy. Does not bruise/bleed easily.  Psychiatric/Behavioral: Negative for behavioral problems, confusion, sleep disturbance, dysphoric mood, decreased concentration and agitation.  OBJECTIVE: Temp:  [99.2 F (37.3 C)-100.3 F (37.9 C)] 99.2 F (37.3 C) (06/03 0429) Pulse Rate:  [79-89] 89 (06/03 0429) Resp:  [18] 18 (06/03 0429) BP: (141-163)/(58-72) 163/72 mmHg (06/03 0429) SpO2:  [97 %-100 %] 97 % (06/03 0429) Physical Exam  Constitutional: He is oriented to person, place, and time. He appears well-developed and well-nourished. No distress.  HENT:  Mouth/Throat: Oropharynx is clear and moist. No  oropharyngeal exudate.  Cardiovascular: Normal rate, regular rhythm and normal heart sounds. Exam reveals no gallop and no friction rub.  3/6SEM BH LUSB Pulmonary/Chest: Effort normal and breath sounds normal. No respiratory distress. He has no wheezes.  Abdominal: Soft. Bowel sounds are normal. He exhibits no distension. There is no tenderness.  Lymphadenopathy:  no cervical adenopathy.  Neurological: He is alert and oriented to person, place, and time.  Ext: right foot dressed from amputation Skin: Skin is warm and dry. No rash noted. No erythema.  Psychiatric: He has a normal mood and affect. His behavior is normal.    LABS: Results for orders placed during the hospital encounter of 07/27/12 (from the past 48 hour(s))  GLUCOSE, CAPILLARY     Status: Abnormal   Collection Time    08/06/12 12:07 PM      Result Value Range   Glucose-Capillary 161 (*) 70 - 99 mg/dL   Comment 1 Documented in Chart     Comment 2 Notify RN    GLUCOSE, CAPILLARY     Status: Abnormal   Collection Time    08/06/12  4:16 PM      Result Value Range   Glucose-Capillary 172 (*) 70 - 99 mg/dL   Comment 1 Documented in Chart     Comment 2 Notify RN    POTASSIUM     Status: None   Collection Time    08/06/12  7:01 PM      Result Value Range   Potassium 4.2  3.5 - 5.1 mEq/L   Comment: DELTA CHECK NOTED  GLUCOSE, CAPILLARY     Status: None   Collection Time    08/06/12  9:03 PM      Result Value Range   Glucose-Capillary 88  70 - 99 mg/dL  VANCOMYCIN, TROUGH     Status: Abnormal   Collection Time    08/07/12  5:30 AM      Result Value Range   Vancomycin Tr 33.1 (*) 10.0 - 20.0 ug/mL   Comment: CRITICAL RESULT CALLED TO, READ BACK BY AND VERIFIED WITH:     GARDNER D,RN 08/07/12 0642 WAYK  BASIC METABOLIC PANEL     Status: Abnormal   Collection Time    08/07/12  5:30 AM      Result Value Range   Sodium 135  135 - 145 mEq/L   Potassium 4.1  3.5 - 5.1 mEq/L   Chloride 100  96 - 112 mEq/L   CO2 25  19  - 32 mEq/L   Glucose, Bld 70  70 - 99 mg/dL   BUN 12  6 - 23 mg/dL   Creatinine, Ser 1.35  0.50 - 1.35 mg/dL   Calcium 8.7  8.4 - 10.5 mg/dL   GFR calc non Af Amer 55 (*) >90 mL/min   GFR calc Af Amer 63 (*) >90 mL/min   Comment:            The eGFR has been calculated     using the CKD EPI equation.     This calculation  has not been     validated in all clinical     situations.     eGFR's persistently     <90 mL/min signify     possible Chronic Kidney Disease.  CBC     Status: Abnormal   Collection Time    08/07/12  5:30 AM      Result Value Range   WBC 21.2 (*) 4.0 - 10.5 K/uL   RBC 2.96 (*) 4.22 - 5.81 MIL/uL   Hemoglobin 8.2 (*) 13.0 - 17.0 g/dL   HCT 24.5 (*) 39.0 - 52.0 %   MCV 82.8  78.0 - 100.0 fL   MCH 27.7  26.0 - 34.0 pg   MCHC 33.5  30.0 - 36.0 g/dL   RDW 16.7 (*) 11.5 - 15.5 %   Platelets 516 (*) 150 - 400 K/uL  PROCALCITONIN     Status: None   Collection Time    08/07/12  5:30 AM      Result Value Range   Procalcitonin 1.04     Comment:            Interpretation:     PCT > 0.5 ng/mL and <= 2 ng/mL:     Systemic infection (sepsis) is possible,     but other conditions are known to elevate     PCT as well.     (NOTE)             ICU PCT Algorithm               Non ICU PCT Algorithm        ----------------------------     ------------------------------             PCT < 0.25 ng/mL                 PCT < 0.1 ng/mL         Stopping of antibiotics            Stopping of antibiotics           strongly encouraged.               strongly encouraged.        ----------------------------     ------------------------------           PCT level decrease by               PCT < 0.25 ng/mL           >= 80% from peak PCT           OR PCT 0.25 - 0.5 ng/mL          Stopping of antibiotics                                                 encouraged.         Stopping of antibiotics               encouraged.        ----------------------------      ------------------------------           PCT level decrease by              PCT >= 0.25 ng/mL           < 80% from peak PCT  AND PCT >= 0.5 ng/mL            Continuing antibiotics                                                  encouraged.           Continuing antibiotics                encouraged.        ----------------------------     ------------------------------         PCT level increase compared          PCT > 0.5 ng/mL             with peak PCT AND              PCT >= 0.5 ng/mL             Escalation of antibiotics                                              strongly encouraged.          Escalation of antibiotics            strongly encouraged.  GLUCOSE, CAPILLARY     Status: Abnormal   Collection Time    08/07/12  6:06 AM      Result Value Range   Glucose-Capillary 65 (*) 70 - 99 mg/dL  GLUCOSE, CAPILLARY     Status: None   Collection Time    08/07/12  6:49 AM      Result Value Range   Glucose-Capillary 91  70 - 99 mg/dL  GLUCOSE, CAPILLARY     Status: Abnormal   Collection Time    08/07/12 11:30 AM      Result Value Range   Glucose-Capillary 147 (*) 70 - 99 mg/dL   Comment 1 Documented in Chart     Comment 2 Notify RN    [place], [place]     Status: Abnormal   Collection Time    08/07/12  3:43 PM      Result Value Range   Vancomycin Tr 24.5 (*) 10.0 - 20.0 ug/mL  GLUCOSE, CAPILLARY     Status: Abnormal   Collection Time    08/07/12  4:13 PM      Result Value Range   Glucose-Capillary 113 (*) 70 - 99 mg/dL   Comment 1 Documented in Chart     Comment 2 Notify RN    GLUCOSE, CAPILLARY     Status: Abnormal   Collection Time    08/07/12  9:40 PM      Result Value Range   Glucose-Capillary 111 (*) 70 - 99 mg/dL  BASIC METABOLIC PANEL     Status: Abnormal   Collection Time    08/08/12  4:45 AM      Result Value Range   Sodium 136  135 - 145 mEq/L   Potassium 3.9  3.5 - 5.1 mEq/L   Chloride 102  96 - 112 mEq/L   CO2 26  19 - 32 mEq/L   Glucose,  Bld 61 (*) 70 - 99 mg/dL   BUN 11  6 - 23 mg/dL   Creatinine, Ser 1.35  0.50 - 1.35 mg/dL   Calcium 8.5  8.4 - 10.5 mg/dL   GFR calc non Af Amer 55 (*) >90 mL/min   GFR calc Af Amer 63 (*) >90 mL/min   Comment:            The eGFR has been calculated     using the CKD EPI equation.     This calculation has not been     validated in all clinical     situations.     eGFR's persistently     <90 mL/min signify     possible Chronic Kidney Disease.  GLUCOSE, CAPILLARY     Status: None   Collection Time    08/08/12  5:58 AM      Result Value Range   Glucose-Capillary 82  70 - 99 mg/dL  CBC     Status: Abnormal   Collection Time    08/08/12  9:20 AM      Result Value Range   WBC 20.4 (*) 4.0 - 10.5 K/uL   RBC 2.47 (*) 4.22 - 5.81 MIL/uL   Hemoglobin 6.7 (*) 13.0 - 17.0 g/dL   Comment: SPECIMEN CHECKED FOR CLOTS     REPEATED TO VERIFY     CRITICAL RESULT CALLED TO, READ BACK BY AND VERIFIED WITH:     Brigid Re [id number] BY BARRK   HCT 20.5 (*) 39.0 - 52.0 %   MCV 83.0  78.0 - 100.0 fL   MCH 27.1  26.0 - 34.0 pg   MCHC 32.7  30.0 - 36.0 g/dL   RDW 16.6 (*) 11.5 - 15.5 %   Platelets 530 (*) 150 - 400 K/uL    MICRO: 5/22 blood cx 2 NGTD 5/26 blood cx 2 NGTD 5/30 blood cx x2  NGTD IMAGING: MRI of right foot on 5/23 IMPRESSION:  1. Osteomyelitis of the distal phalanx of the third toe, with gas  primarily tracking in the subcutaneous tissues of the third toe,  compatible with gas-forming soft tissue infection in the third toe.  Trace amount of suspected gas in the distal first and second  intermetatarsal spaces.  2. Diffuse soft tissue infiltrative edema in the foot, involving  its subcutaneous and muscular structures. Appearance suggests  cellulitis and myositis.  3. Large blister along the dorsum of the foot.  4. Subtle edema in the distal talus, likely reactive or due to  mild stress reaction.    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 Currently on vancomycin and piptazo.  1) recommend 6 wk of IV vancomycin and can do ceftriaxone 2gm IV daily plus metronidazole 500mg  PO TID (oral) since there is concern for residual infection. Use antibiotic start date of 5/29 and end on July 10th. 2) will need home health to check weekly cbc, cmp, and vanco trough, goal 15-20, adjust per protocol 2) If patient has BKA, may decrease this course of therapy and consider oral regimen for 7-10 days. 3) will arrange follow up in ID clinic in 5-6 wk. 4) will check cbc with diff, sed rate, and crp  Thank you for consultation  Caren Griffins B. [facility] [phone]

## 2012-08-08 NOTE — Progress Notes (Signed)
Inpatient Diabetes Program Recommendations  AACE/ADA: New Consensus Statement on Inpatient Glycemic Control (2013)  Target Ranges:  Prepandial:   less than 140 mg/dL      Peak postprandial:   less than 180 mg/dL (1-2 hours)      Critically ill patients:  140 - 180 mg/dL   Reason for Assessment: Before breakfast CBG's below target  Results for KENSHIN, CARLBERG (MRN [id number]) as of 08/08/2012 13:58  Ref. Range 08/07/2012 06:06 08/07/2012 06:49 08/07/2012 11:30 08/07/2012 16:13 08/07/2012 21:40 08/08/2012 05:58 08/08/2012 11:39  Glucose-Capillary Latest Range: 70-99 mg/dL 65 (L) 91 147 (H) 113 (H) 111 (H) 82 116 (H)   Note: CBG's before breakfast less than target.  May benefit from decrease in Levemir dosage from 25 units to 20 units.  Thank you.  Yusuf Yu S. Marcelline Mates, RN, CNS, CDE Inpatient Diabetes Program, team pager [phone]

## 2012-08-08 NOTE — Progress Notes (Signed)
CRITICAL VALUE ALERT  Critical value received:  HGb 6.7   Date of notification:  08/08/12   Time of notification:  0920   Critical value read back:yes   Nurse who received alert:  Chilton Greathouse, RN   MD notified (1st page):  Dr . Karleen Hampshire present on floor    Time of first page:  0920  Responding MD:  Karleen Hampshire  Time MD responded:  [phone]

## 2012-08-08 NOTE — Consult Note (Signed)
Reason for Consult: Capacity evaluation Referring Physician: Dr. Celene Skeen Prideaux is an 62 y.o. male.  HPI: Patient is seen and chart reviewed. Patient has been aware of his medical problems especially gangrene of his foot and previously consented for the procedures. He is reluctant to go for BKA and hopes he can be treated with non invasive procedures. He is also aware that he and treatment team has to make a health care decisions when he can not be treated with his current medication treatment. He has denied symptoms of depression and anxiety. He has no evidence of psychosis. Patient has received a couple of phone calls from his family and friends from church who are his well wishers were asking about his health..  Mental Status Examination: Patient appeared as per his stated age, lying down on his bed and covered himself with bed sheets. He is awake, alert and oriented to time, place, person and situation. He Has good eye contact. Patient has depressed and anxious mood and his affect was constricted. He has normal speech. His thought process is linear and goal directed. Patient has denied suicidal, homicidal ideations, intentions or plans. Patient has no evidence of auditory or visual hallucinations, delusions, and paranoia. Patient has fair insight judgment and impulse control.  Past Medical History  Diagnosis Date  . Hypertension   . Diabetes mellitus without complication     Past Surgical History  Procedure Laterality Date  . Coronary artery bypass graft  2008    4 vessel  . Amputation Right 07/29/2012    Procedure: AMPUTATION 3RD TOE;  Surgeon: Kerin Salen, MD;  Location: [facility];  Service: Orthopedics;  Laterality: Right;  right third toe amputation  . Amputation Right 08/02/2012    Procedure: right transmetatarsal amputation;  Surgeon: Kerin Salen, MD;  Location: [facility];  Service: Orthopedics;  Laterality: Right;    No family history on file.  Social History:  reports that he has  never smoked. He does not have any smokeless tobacco history on file. He reports that he does not drink alcohol or use illicit drugs.  Allergies: No Known Allergies  Medications: I have reviewed the patient's current medications.  Results for orders placed during the hospital encounter of 07/27/12 (from the past 48 hour(s))  GLUCOSE, CAPILLARY     Status: Abnormal   Collection Time    08/06/12  4:16 PM      Result Value Range   Glucose-Capillary 172 (*) 70 - 99 mg/dL   Comment 1 Documented in Chart     Comment 2 Notify RN    POTASSIUM     Status: None   Collection Time    08/06/12  7:01 PM      Result Value Range   Potassium 4.2  3.5 - 5.1 mEq/L   Comment: DELTA CHECK NOTED  GLUCOSE, CAPILLARY     Status: None   Collection Time    08/06/12  9:03 PM      Result Value Range   Glucose-Capillary 88  70 - 99 mg/dL  VANCOMYCIN, TROUGH     Status: Abnormal   Collection Time    08/07/12  5:30 AM      Result Value Range   Vancomycin Tr 33.1 (*) 10.0 - 20.0 ug/mL   Comment: CRITICAL RESULT CALLED TO, READ BACK BY AND VERIFIED WITH:     GARDNER D,RN 08/07/12 0642 WAYK  BASIC METABOLIC PANEL     Status: Abnormal   Collection Time  08/07/12  5:30 AM      Result Value Range   Sodium 135  135 - 145 mEq/L   Potassium 4.1  3.5 - 5.1 mEq/L   Chloride 100  96 - 112 mEq/L   CO2 25  19 - 32 mEq/L   Glucose, Bld 70  70 - 99 mg/dL   BUN 12  6 - 23 mg/dL   Creatinine, Ser 1.35  0.50 - 1.35 mg/dL   Calcium 8.7  8.4 - 10.5 mg/dL   GFR calc non Af Amer 55 (*) >90 mL/min   GFR calc Af Amer 63 (*) >90 mL/min   Comment:            The eGFR has been calculated     using the CKD EPI equation.     This calculation has not been     validated in all clinical     situations.     eGFR's persistently     <90 mL/min signify     possible Chronic Kidney Disease.  CBC     Status: Abnormal   Collection Time    08/07/12  5:30 AM      Result Value Range   WBC 21.2 (*) 4.0 - 10.5 K/uL   RBC 2.96 (*)  4.22 - 5.81 MIL/uL   Hemoglobin 8.2 (*) 13.0 - 17.0 g/dL   HCT 24.5 (*) 39.0 - 52.0 %   MCV 82.8  78.0 - 100.0 fL   MCH 27.7  26.0 - 34.0 pg   MCHC 33.5  30.0 - 36.0 g/dL   RDW 16.7 (*) 11.5 - 15.5 %   Platelets 516 (*) 150 - 400 K/uL  PROCALCITONIN     Status: None   Collection Time    08/07/12  5:30 AM      Result Value Range   Procalcitonin 1.04     Comment:            Interpretation:     PCT > 0.5 ng/mL and <= 2 ng/mL:     Systemic infection (sepsis) is possible,     but other conditions are known to elevate     PCT as well.     (NOTE)             ICU PCT Algorithm               Non ICU PCT Algorithm        ----------------------------     ------------------------------             PCT < 0.25 ng/mL                 PCT < 0.1 ng/mL         Stopping of antibiotics            Stopping of antibiotics           strongly encouraged.               strongly encouraged.        ----------------------------     ------------------------------           PCT level decrease by               PCT < 0.25 ng/mL           >= 80% from peak PCT           OR PCT 0.25 - 0.5 ng/mL          Stopping  of antibiotics                                                 encouraged.         Stopping of antibiotics               encouraged.        ----------------------------     ------------------------------           PCT level decrease by              PCT >= 0.25 ng/mL           < 80% from peak PCT            AND PCT >= 0.5 ng/mL            Continuing antibiotics                                                  encouraged.           Continuing antibiotics                encouraged.        ----------------------------     ------------------------------         PCT level increase compared          PCT > 0.5 ng/mL             with peak PCT AND              PCT >= 0.5 ng/mL             Escalation of antibiotics                                              strongly encouraged.          Escalation of  antibiotics            strongly encouraged.  GLUCOSE, CAPILLARY     Status: Abnormal   Collection Time    08/07/12  6:06 AM      Result Value Range   Glucose-Capillary 65 (*) 70 - 99 mg/dL  GLUCOSE, CAPILLARY     Status: None   Collection Time    08/07/12  6:49 AM      Result Value Range   Glucose-Capillary 91  70 - 99 mg/dL  GLUCOSE, CAPILLARY     Status: Abnormal   Collection Time    08/07/12 11:30 AM      Result Value Range   Glucose-Capillary 147 (*) 70 - 99 mg/dL   Comment 1 Documented in Chart     Comment 2 Notify RN    [place], [place]     Status: Abnormal   Collection Time    08/07/12  3:43 PM      Result Value Range   Vancomycin Tr 24.5 (*) 10.0 - 20.0 ug/mL  GLUCOSE, CAPILLARY     Status: Abnormal   Collection Time    08/07/12  4:13 PM      Result Value Range   Glucose-Capillary 113 (*)  70 - 99 mg/dL   Comment 1 Documented in Chart     Comment 2 Notify RN    GLUCOSE, CAPILLARY     Status: Abnormal   Collection Time    08/07/12  9:40 PM      Result Value Range   Glucose-Capillary 111 (*) 70 - 99 mg/dL  BASIC METABOLIC PANEL     Status: Abnormal   Collection Time    08/08/12  4:45 AM      Result Value Range   Sodium 136  135 - 145 mEq/L   Potassium 3.9  3.5 - 5.1 mEq/L   Chloride 102  96 - 112 mEq/L   CO2 26  19 - 32 mEq/L   Glucose, Bld 61 (*) 70 - 99 mg/dL   BUN 11  6 - 23 mg/dL   Creatinine, Ser 1.35  0.50 - 1.35 mg/dL   Calcium 8.5  8.4 - 10.5 mg/dL   GFR calc non Af Amer 55 (*) >90 mL/min   GFR calc Af Amer 63 (*) >90 mL/min   Comment:            The eGFR has been calculated     using the CKD EPI equation.     This calculation has not been     validated in all clinical     situations.     eGFR's persistently     <90 mL/min signify     possible Chronic Kidney Disease.  GLUCOSE, CAPILLARY     Status: None   Collection Time    08/08/12  5:58 AM      Result Value Range   Glucose-Capillary 82  70 - 99 mg/dL  CBC     Status: Abnormal    Collection Time    08/08/12  9:20 AM      Result Value Range   WBC 20.4 (*) 4.0 - 10.5 K/uL   RBC 2.47 (*) 4.22 - 5.81 MIL/uL   Hemoglobin 6.7 (*) 13.0 - 17.0 g/dL   Comment: SPECIMEN CHECKED FOR CLOTS     REPEATED TO VERIFY     CRITICAL RESULT CALLED TO, READ BACK BY AND VERIFIED WITH:     Brigid Re [id number] BY BARRK   HCT 20.5 (*) 39.0 - 52.0 %   MCV 83.0  78.0 - 100.0 fL   MCH 27.1  26.0 - 34.0 pg   MCHC 32.7  30.0 - 36.0 g/dL   RDW 16.6 (*) 11.5 - 15.5 %   Platelets 530 (*) 150 - 400 K/uL  GLUCOSE, CAPILLARY     Status: Abnormal   Collection Time    08/08/12 11:39 AM      Result Value Range   Glucose-Capillary 116 (*) 70 - 99 mg/dL  TYPE AND SCREEN     Status: None   Collection Time    08/08/12 11:45 AM      Result Value Range   ABO/RH(D) B POS     Antibody Screen PENDING     Sample Expiration 08/11/2012      No results found.  Positive for anxiety and depression Blood pressure 163/72, pulse 89, temperature 99.2 F (37.3 C), temperature source Oral, resp. rate 18, height 5\' 11"  (1.803 m), weight 195 lb 15.8 oz (88.9 kg), SpO2 97.00%.   Assessment/Plan: Depression NOS  Patient meets criteria of making his own health care dicisions. He may need education regarding risks and benefit of suggested surgical procedure. Appreciate psychiatric consultation and will sign off.  Darlynn Ricco,JANARDHAHA R. 08/08/2012, 1:12 PM

## 2012-08-09 ENCOUNTER — Inpatient Hospital Stay (HOSPITAL_COMMUNITY): Payer: Medicare Other

## 2012-08-09 DIAGNOSIS — A480 Gas gangrene: Secondary | ICD-10-CM

## 2012-08-09 DIAGNOSIS — E1169 Type 2 diabetes mellitus with other specified complication: Secondary | ICD-10-CM

## 2012-08-09 DIAGNOSIS — M869 Osteomyelitis, unspecified: Secondary | ICD-10-CM

## 2012-08-09 DIAGNOSIS — F341 Dysthymic disorder: Secondary | ICD-10-CM

## 2012-08-09 DIAGNOSIS — R112 Nausea with vomiting, unspecified: Secondary | ICD-10-CM

## 2012-08-09 LAB — DIFFERENTIAL
Eosinophils Relative: 0 % (ref 0–5)
Lymphocytes Relative: 8 % — ABNORMAL LOW (ref 12–46)
Lymphs Abs: 1.3 10*3/uL (ref 0.7–4.0)
Monocytes Absolute: 1 10*3/uL (ref 0.1–1.0)
Neutro Abs: 14.6 10*3/uL — ABNORMAL HIGH (ref 1.7–7.7)

## 2012-08-09 LAB — GLUCOSE, CAPILLARY
Glucose-Capillary: 36 mg/dL — CL (ref 70–99)
Glucose-Capillary: 38 mg/dL — CL (ref 70–99)
Glucose-Capillary: 64 mg/dL — ABNORMAL LOW (ref 70–99)
Glucose-Capillary: 131 mg/dL — ABNORMAL HIGH (ref 70–99)
Glucose-Capillary: 163 mg/dL — ABNORMAL HIGH (ref 70–99)
Glucose-Capillary: 232 mg/dL — ABNORMAL HIGH (ref 70–99)

## 2012-08-09 LAB — BASIC METABOLIC PANEL
BUN: 11 mg/dL (ref 6–23)
CO2: 24 mEq/L (ref 19–32)
Chloride: 105 mEq/L (ref 96–112)
Glucose, Bld: 99 mg/dL (ref 70–99)
Potassium: 4 mEq/L (ref 3.5–5.1)
Sodium: 137 mEq/L (ref 135–145)

## 2012-08-09 LAB — CBC
HCT: 27.1 % — ABNORMAL LOW (ref 39.0–52.0)
Hemoglobin: 9.3 g/dL — ABNORMAL LOW (ref 13.0–17.0)
MCHC: 34.3 g/dL (ref 30.0–36.0)
MCV: 81.9 fL (ref 78.0–100.0)
RDW: 16 % — ABNORMAL HIGH (ref 11.5–15.5)

## 2012-08-09 LAB — OCCULT BLOOD X 1 CARD TO LAB, STOOL: Fecal Occult Bld: NEGATIVE

## 2012-08-09 LAB — FOLATE: Folate: 7.2 ng/mL

## 2012-08-09 LAB — VITAMIN B12: Vitamin B-12: 792 pg/mL (ref 211–911)

## 2012-08-09 MED ORDER — GLUCAGON HCL (RDNA) 1 MG IJ SOLR
INTRAMUSCULAR | Status: AC
  Administered 2012-08-09: 1 mg
  Filled 2012-08-09: qty 1

## 2012-08-09 MED ORDER — METOCLOPRAMIDE HCL 5 MG/ML IJ SOLN
5.0000 mg | Freq: Three times a day (TID) | INTRAMUSCULAR | Status: DC
  Administered 2012-08-09 – 2012-08-11 (×3): 5 mg via INTRAVENOUS
  Filled 2012-08-09 (×8): qty 1

## 2012-08-09 MED ORDER — INSULIN ASPART 100 UNIT/ML ~~LOC~~ SOLN
0.0000 [IU] | Freq: Three times a day (TID) | SUBCUTANEOUS | Status: DC
  Administered 2012-08-09: 1 [IU] via SUBCUTANEOUS
  Administered 2012-08-10: 2 [IU] via SUBCUTANEOUS
  Administered 2012-08-10: 1 [IU] via SUBCUTANEOUS
  Administered 2012-08-10: 5 [IU] via SUBCUTANEOUS
  Administered 2012-08-11: 2 [IU] via SUBCUTANEOUS

## 2012-08-09 MED ORDER — INSULIN DETEMIR 100 UNIT/ML ~~LOC~~ SOLN
15.0000 [IU] | Freq: Every day | SUBCUTANEOUS | Status: DC
  Administered 2012-08-09 – 2012-08-10 (×2): 15 [IU] via SUBCUTANEOUS
  Filled 2012-08-09 (×3): qty 0.15

## 2012-08-09 MED ORDER — INSULIN DETEMIR 100 UNIT/ML ~~LOC~~ SOLN: 20.0000 [IU] | Freq: Every day | SUBCUTANEOUS | Status: DC

## 2012-08-09 MED ORDER — METRONIDAZOLE IN NACL 5-0.79 MG/ML-% IV SOLN
500.0000 mg | Freq: Three times a day (TID) | INTRAVENOUS | Status: DC
  Administered 2012-08-09 – 2012-08-10 (×2): 500 mg via INTRAVENOUS
  Filled 2012-08-09 (×5): qty 100

## 2012-08-09 MED ORDER — SODIUM CHLORIDE 0.9 % IJ SOLN
10.0000 mL | INTRAMUSCULAR | Status: DC | PRN
  Administered 2012-08-11: 10 mL

## 2012-08-09 MED ORDER — HYDRALAZINE HCL 50 MG PO TABS
50.0000 mg | ORAL_TABLET | Freq: Three times a day (TID) | ORAL | Status: DC
  Administered 2012-08-09 – 2012-08-11 (×7): 50 mg via ORAL
  Filled 2012-08-09 (×9): qty 1

## 2012-08-09 MED ORDER — PROMETHAZINE HCL 25 MG/ML IJ SOLN
25.0000 mg | Freq: Four times a day (QID) | INTRAMUSCULAR | Status: DC | PRN
  Filled 2012-08-09: qty 1

## 2012-08-09 MED ORDER — INSULIN ASPART 100 UNIT/ML ~~LOC~~ SOLN
0.0000 [IU] | Freq: Every day | SUBCUTANEOUS | Status: DC
  Administered 2012-08-09: 2 [IU] via SUBCUTANEOUS

## 2012-08-09 MED ORDER — PANTOPRAZOLE SODIUM 40 MG IV SOLR
40.0000 mg | Freq: Two times a day (BID) | INTRAVENOUS | Status: DC
  Administered 2012-08-09 – 2012-08-10 (×2): 40 mg via INTRAVENOUS
  Filled 2012-08-09 (×5): qty 40

## 2012-08-09 MED ORDER — METRONIDAZOLE 500 MG PO TABS
500.0000 mg | ORAL_TABLET | Freq: Three times a day (TID) | ORAL | Status: DC
  Administered 2012-08-09: 500 mg via ORAL
  Filled 2012-08-09 (×3): qty 1

## 2012-08-09 NOTE — Progress Notes (Signed)
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  No Known Allergies  Patient Measurements: Height: 5\' 11"  (180.3 cm) Weight: 195 lb 15.8 oz (88.9 kg) IBW/kg (Calculated) : 75.3  Vital Signs: Temp: 99.7 F (37.6 C) (06/04 1350) Temp src: Oral (06/04 1350) BP: 157/84 mmHg (06/04 1350) Pulse Rate: 108 (06/04 1350) Intake/Output from previous day: 06/03 0701 - 06/04 0700 In: 1385.5 [P.O.:120; I.V.:703; Blood:562.5] Out: 1000 [Urine:1000] Intake/Output from this shift: Total I/O In: 480 [P.O.:480] Out: 1202 [Urine:1200; Stool:2]  Labs:  Recent Labs  08/07/12 0530 08/08/12 0445 08/08/12 0920 08/09/12 0735  WBC 21.2*  --  20.4* 16.9*  HGB 8.2*  --  6.7* 9.3*  PLT 516*  --  530* 517*  CREATININE 1.35 1.35  --  1.22   Estimated Creatinine Clearance: 66.9 ml/min (by C-G formula based on Cr of 1.22).  Recent Labs  08/07/12 0530 08/07/12 1543  VANCOTROUGH 33.1* 24.5*     Microbiology:   5/22 blood cultures x 2 negative; 5/30 blood cultures x 2 - negative to date   5/30 - yeast in urine - Fluconazole 200 mg PO x 1 given  Assessment:  Day # 14 Vancomycin. # 2 Ceftriaxone and PO Flagyl.  POD # 6 right 2nd & 4th toes and  forefoot amputation,  POD # 10 right 3rd toe amputation.   BKA recommended, but patient refusing. Planning 6 weeks antibiotics, thru July 10th.    Initial trough level (5/25) was 16.9 mcg/ml on Vanc 1gram IV q12hrs, but subsequent trough level on 5/30 was 24.4 mcg/ml (above goal), and dose was decreased to 750 mg IV q12hrs.     Next "Trough" level 6/2 am = 33.1 mcg/ml, but dose given at 4:32am and trough drawn at 5:30am, so invalid. At goal for peak level. True trough level 6/2 pm = 24.5 mcg/ml, and dose adjusted from 750 mg IV q12hrs to 1 gram IV q24hrs at 6am.     BUN and creatinine have normalized.   Goal of Therapy:  Vancomycin trough level 15-20 mcg/ml  Plan:     Continue Vancomycin 1 gram IV q24hrs. 2nd dose today.   Trough level planned for 6/6 am, prior to 4th dose of current regimen.    Bmet at least every 3 days while on Vancomycin in the hospital.   Will need weekly vancomycin trough levels and bmet for duration of course.  Arty Baumgartner, [place] Pager: [phone] 08/09/2012,2:07 PM

## 2012-08-09 NOTE — Progress Notes (Signed)
Physical Therapy Treatment Patient Details Name: [name] MRN: [id number] DOB: 11-27-1950 Today's Date: 08/09/2012 Time: [id number] PT Time Calculation (min): 38 min  PT Assessment / Plan / Recommendation Comments on Treatment Session  Pt s/p right toes amputation with Darco shoe.  Patient tolerated ther-ex well. Pt limited in mobility due to vomited and then once ambulated, pt with bleeding from right LE.  Instructed in positioning and elevation for RLE. Will continue to monitor and progress activity as tolerated.     Follow Up Recommendations  SNF                 Equipment Recommendations  Rolling walker with 5" wheels        Frequency Min 3X/week   Plan Discharge plan remains appropriate;Frequency remains appropriate    Precautions / Restrictions Precautions Precautions: Fall Precaution Comments: cognitive deficits Required Braces or Orthoses: Other Brace/Splint Other Brace/Splint: Darco shoe Restrictions Weight Bearing Restrictions: Yes RLE Weight Bearing: Partial weight bearing   Pertinent Vitals/Pain VSS, no pain    Mobility  Bed Mobility Bed Mobility: Not assessed Transfers Transfers: Sit to Stand;Stand to Sit Sit to Stand: 4: Min guard;With upper extremity assist;With armrests;From chair/3-in-1 Stand to Sit: 4: Min guard;With upper extremity assist;To chair/3-in-1;With armrests Stand Pivot Transfers: Not tested (comment) Details for Transfer Assistance: Was getting pt ready for standing and without warning, pt began vomiting profusely.  Vomited large amount on floor and on self.  NT came in and assisted with clean up and nurse brought nausea med.  Once cleaned, pt needed vc for safety/hand placement to stand. Ambulation/Gait Ambulation/Gait Assistance: 4: Min guard Ambulation Distance (Feet): 75 Feet Assistive device: Rolling walker Ambulation/Gait Assistance Details: Pt ambulated with RW with cues for sequencing steps and RW.  Pt moving his right LE too far  at times needing cues.  Cues also needed to keep weight on heel and maintain PWB.  After ambulating 40 feet, noted blood on floor.  Wound on right foot leaking therefore headed back to room and nursing came to change dressing.  Gait Pattern: Step-to pattern;Decreased stance time - right;Shuffle;Trunk flexed;Narrow base of support Gait velocity: decreased due to pain Stairs: No Wheelchair Mobility Wheelchair Mobility: No    Exercises General Exercises - Lower Extremity Ankle Circles/Pumps: Both;10 reps;AROM Long Arc Quad: AROM;Both;10 reps;Seated Hip Flexion/Marching: AROM;Both;10 reps;Seated    PT Goals Acute Rehab PT Goals Pt will go Sit to Stand: with modified independence PT Goal: Sit to Stand - Progress: Progressing toward goal Pt will go Stand to Sit: with modified independence PT Goal: Stand to Sit - Progress: Progressing toward goal Pt will Ambulate: >150 feet;with modified independence;with rolling walker PT Goal: Ambulate - Progress: Progressing toward goal  Visit Information  Last PT Received On: 08/09/12 Assistance Needed: +1    Subjective Data  Subjective: "I feel better." pt stated after he vomited. Patient Stated Goal: to go home   Cognition  Cognition Arousal/Alertness: Awake/alert Behavior During Therapy: Flat affect Overall Cognitive Status: Impaired/Different from baseline Area of Impairment: Orientation;Attention;Memory;Following commands;Safety/judgement;Awareness;Problem solving Orientation Level: Disoriented to;Time Current Attention Level: Focused Memory: Decreased short-term memory Following Commands: Follows one step commands with increased time Safety/Judgement: Decreased awareness of safety;Decreased awareness of deficits Awareness: Emergent Problem Solving: Slow processing;Decreased initiation;Difficulty sequencing;Requires verbal cues General Comments: appears more depressed today. discussed current situation with MD recommending amputation. Pt  staes that he is "not having anymore cutting" Pt with poor initiation, requiring 3 minutes to stand. Poor mental flexibility. Poor judgement/insight.  Balance  Static Sitting Balance Static Sitting - Level of Assistance: Not tested (comment) Dynamic Standing Balance Dynamic Standing - Balance Support: No upper extremity supported;During functional activity Dynamic Standing - Level of Assistance: 4: Min assist Dynamic Standing - Balance Activities: Forward lean/weight shifting;Lateral lean/weight shifting;Reaching across midline Dynamic Standing - Comments: Generally unsteady with dynamic activities.  End of Session PT - End of Session Equipment Utilized During Treatment: Gait belt Activity Tolerance: Patient limited by fatigue;Patient limited by pain Patient left: in chair;with call bell/phone within reach;with nursing in room;with family/visitor present Nurse Communication: Mobility status        [name],Tiesha Marich 08/09/2012, 12:25 PM [facility] Acute Rehabilitation [phone] (pager)

## 2012-08-09 NOTE — Progress Notes (Signed)
Inpatient Diabetes Program Recommendations  AACE/ADA: New Consensus Statement on Inpatient Glycemic Control (2013)  Target Ranges:  Prepandial:   less than 140 mg/dL      Peak postprandial:   less than 180 mg/dL (1-2 hours)      Critically ill patients:  140 - 180 mg/dL  Results for [name], [name] (MRN [id number]) as of 08/09/2012 10:23  Ref. Range 08/09/2012 05:57 08/09/2012 06:29 08/09/2012 06:32 08/09/2012 07:01 08/09/2012 07:32  Glucose-Capillary Latest Range: 70-99 mg/dL 36 (LL) 33 (LL) 38 (LL) 64 (L) 92    Inpatient Diabetes Program Recommendations Insulin - Basal: Decrease Levemir to 15 units Thank you  Raoul Pitch BSN, RN,CDE Inpatient Diabetes Coordinator [phone] (team pager)

## 2012-08-09 NOTE — Clinical Social Work Psych Note (Signed)
Psych CSW reviewed chart.  Per psychiatry notes documented on 08/08/2012 on 1:12PM, "patient meets criteria of making his own health care decisions." Psychiatry request pt education re: benefit of suggested surgical procedure."  Please refer to the specific documentation for details.  Psychiatry is signing off.  Please re-consult as necessary.  Nonnie Done, [place] [phone]  Clinical Social Work

## 2012-08-09 NOTE — Progress Notes (Signed)
[place] for Infectious Disease    Date of Admission:  07/27/2012   Total days of antibiotics 14        Day 2 ceftriaxone        Day 2 metronidazole        Day 14 vanco   ID: [name] is a 62 y.o. 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 Currently on vancomycin and piptazo.  Principal Problem:   DKA (diabetic ketoacidoses) Active Problems:   Sepsis   Gangrene of toe   S/P CABG x 4   H/O: CVA (cerebrovascular accident)   Tobacco abuse   Alcohol abuse   Hypertension   Anemia   DM (diabetes mellitus) type 2, uncontrolled, with ketoacidosis    Subjective: Febrile to 101.8 yesterday, but WBC continues to improve. Having nausea and vomiting this morning  Medications:  . aspirin  81 mg Oral Daily  . cefTRIAXone (ROCEPHIN)  IV  2 g Intravenous Q24H  . enoxaparin (LOVENOX) injection  40 mg Subcutaneous Q24H  . famotidine  10 mg Oral BID  . hydrALAZINE  25 mg Oral Q8H  . insulin aspart  0-15 Units Subcutaneous TID WC  . insulin aspart  3 Units Subcutaneous TID WC  . insulin detemir  25 Units Subcutaneous Q24H  . metoprolol tartrate  25 mg Oral BID  . metroNIDAZOLE  500 mg Oral Q8H  . polyethylene glycol  17 g Oral Daily  . sodium chloride  3 mL Intravenous Q12H  . vancomycin  1,000 mg Intravenous Q24H    Objective: Vital signs in last 24 hours: Temp:  [97.4 F (36.3 C)-101.8 F (38.8 C)] 97.4 F (36.3 C) (06/04 0605) Pulse Rate:  [81-102] 81 (06/04 0605) Resp:  [17-18] 18 (06/04 0605) BP: (136-188)/(69-87) 178/85 mmHg (06/04 0735) SpO2:  [94 %-98 %] 98 % (06/03 1932)   Physical Exam  Constitutional: He is oriented to person, place, and time. He appears well-developed and well-nourished. No distress.  HENT:  Mouth/Throat: Oropharynx is clear and moist. No oropharyngeal exudate.  Cardiovascular: Normal rate, regular rhythm and normal heart sounds. Exam reveals no gallop  and no friction rub.  3/6SEM BH LUSB  Pulmonary/Chest: Effort normal and breath sounds normal. No respiratory distress. He has no wheezes.  Abdominal: Soft. Bowel sounds are normal. He exhibits no distension. There is no tenderness.  Lymphadenopathy: no cervical adenopathy.  Neurological: He is alert and oriented to person, place, and time.  Ext: right foot dressed from amputation  Skin: Skin is warm and dry. No rash noted. No erythema.  Psychiatric: He has a normal mood and affect. His behavior is normal.   Lab Results  Recent Labs  08/07/12 0530 08/08/12 0445 08/08/12 0920 08/09/12 0735  WBC 21.2*  --  20.4* 16.9*  HGB 8.2*  --  6.7* 9.3*  HCT 24.5*  --  20.5* 27.1*  NA 135 136  --   --   K 4.1 3.9  --   --   CL 100 102  --   --   CO2 25 26  --   --   BUN 12 11  --   --   CREATININE 1.35 1.35  --   --    Liver Panel  Recent Labs  08/08/12 1145  PROT 6.7  ALBUMIN 1.5*  AST 26  ALT 16  ALKPHOS 168*  BILITOT 2.0*  BILIDIR 1.3*  IBILI 0.7  Sedimentation Rate  Recent Labs  08/08/12 1145  ESRSEDRATE >140*   C-Reactive Protein  Recent Labs  08/08/12 1145  CRP 24.0*    Microbiology: 6/3 blood cx x 2 NGTD  Studies/Results: No results found.   Assessment/Plan: 62 yo Male with osteomyelitis to right foot s/p TMA however has poor wound healing likely requiring bka, currently on IV antibiotics, had fever to 101.8 yesterday  1) fever : recommend to wait 48hr until blood cx from 6/3 are negative to place picc line. Unclear if source of fever from yesterday is all attributed to his osteomyelitis and poor wound healing vs. Another etiology.  2) osteo:  recommend 6 wk of IV vancomycin and can do ceftriaxone 2gm IV daily plus metronidazole 500mg  PO TID (oral) since there is concern for residual infection. Use antibiotic start date of 5/29 and end on July 10th.  - will need home health to check weekly cbc, cmp, and vanco trough, goal 15-20, adjust per protocol  -  If patient has BKA, may decrease this course of therapy and consider oral regimen for 7-10 days.  - will arrange follow up in ID clinic in 5-6 wk.     [facility] for Infectious Diseases Cell: [phone] Pager: [phone]  08/09/2012, 8:54 AM

## 2012-08-09 NOTE — Progress Notes (Signed)
Peripherally Inserted Central Catheter/Midline Placement  The IV Nurse has discussed with the patient and/or persons authorized to consent for the patient, the purpose of this procedure and the potential benefits and risks involved with this procedure.  The benefits include less needle sticks, lab draws from the catheter and patient may be discharged home with the catheter.  Risks include, but not limited to, infection, bleeding, blood clot (thrombus formation), and puncture of an artery; nerve damage and irregular heat beat.  Alternatives to this procedure were also discussed.  PICC/Midline Placement Documentation        Murvin Natal 08/09/2012, 8:33 AM

## 2012-08-09 NOTE — Progress Notes (Addendum)
Patient ID: [name]  male  [id number]    DOB: 08-May-1950    DOA: 07/27/2012  PCP: No PCP Per Patient  Assessment/Plan: Principal Problem:   Gangrene of toe with sepsis - Recommended BKA by orthopedics, patient has been refusing surgery - Patient evaluated by psychiatry, patient meets criteria for making his own healthcare decisions - Continue IV Vanc and flagyl for 6 weeks, PICC line placed today, followup orthopedics in one week  Active Problems:  Nausea and vomiting: - Change diet to full liquids, obtain stat KUB, continue antiemetics    DKA (diabetic ketoacidoses) now with hypoglycemia - Decrease Levemir to 15 units, discontinue meal coverage - downgrade SSI to sensitive    S/P CABG x 4 - out-patient follow-up with dr Terrence Dupont    H/O: CVA (cerebrovascular accident)    Tobacco abuse/   Alcohol abuse:  - patient counseled to quit     Hypertension:  - increase Hydralazine    Anemia; - Transfuse 2 units packed RBCs    DM (diabetes mellitus) type 2, uncontrolled, with ketoacidosis: As above   Yeast UTI: has completed diflucan   DVT Prophylaxis: lovenox  Code Status:  Disposition: possible in 24-48hrs    Subjective: intractable vomiting, multiple episodes during my encounter, no hematemesis   Objective: Weight change:   Intake/Output Summary (Last 24 hours) at 08/09/12 1020 Last data filed at 08/09/12 0802  Gross per 24 hour  Intake 1265.5 ml  Output    901 ml  Net  364.5 ml   Blood pressure 178/85, pulse 81, temperature 97.4 F (36.3 C), temperature source Oral, resp. rate 18, height 5\' 11"  (1.803 m), weight 88.9 kg (195 lb 15.8 oz), SpO2 98.00%.  Physical Exam: General: Alert and awake, oriented x3, moderate distress. CVS: S1-S2 clear, no murmur rubs or gallops Chest: clear to auscultation bilaterally Abdomen: soft nontender, nondistended, normal bowel sounds  Extremities: no cyanosis, clubbing, right foot dressing intact     Lab  Results: Basic Metabolic Panel:  Recent Labs Lab 08/08/12 0445 08/09/12 0735  NA 136 137  K 3.9 4.0  CL 102 105  CO2 26 24  GLUCOSE 61* 99  BUN 11 11  CREATININE 1.35 1.22  CALCIUM 8.5 8.9   Liver Function Tests:  Recent Labs Lab 08/08/12 1145  AST 26  ALT 16  ALKPHOS 168*  BILITOT 2.0*  PROT 6.7  ALBUMIN 1.5*   No results found for this basename: LIPASE, AMYLASE,  in the last 168 hours  Recent Labs Lab 08/08/12 1145  AMMONIA 19   CBC:  Recent Labs Lab 08/08/12 0920 08/09/12 0735  WBC 20.4* 16.9*  NEUTROABS  --  14.6*  HGB 6.7* 9.3*  HCT 20.5* 27.1*  MCV 83.0 81.9  PLT 530* 517*   Cardiac Enzymes: No results found for this basename: CKTOTAL, CKMB, CKMBINDEX, TROPONINI,  in the last 168 hours BNP: No components found with this basename: POCBNP,  CBG:  Recent Labs Lab 08/09/12 0557 08/09/12 0629 08/09/12 0632 08/09/12 0701 08/09/12 0732  GLUCAP 36* 33* 38* 64* 92     Micro Results: Recent Results (from the past 240 hour(s))  CULTURE, BLOOD (ROUTINE X 2)     Status: None   Collection Time    07/31/12  2:37 PM      Result Value Range Status   Specimen Description BLOOD RIGHT ARM   Final   Special Requests BOTTLES DRAWN AEROBIC ONLY 10CC   Final   Culture  Setup Time 07/31/2012 23:11  Final   Culture NO GROWTH 5 DAYS   Final   Report Status 08/06/2012 FINAL   Final  CULTURE, BLOOD (ROUTINE X 2)     Status: None   Collection Time    07/31/12  3:15 PM      Result Value Range Status   Specimen Description BLOOD RIGHT ARM   Final   Special Requests BOTTLES DRAWN AEROBIC AND ANAEROBIC 10CC   Final   Culture  Setup Time 07/31/2012 23:12   Final   Culture NO GROWTH 5 DAYS   Final   Report Status 08/06/2012 FINAL   Final  URINE CULTURE     Status: None   Collection Time    08/01/12  6:07 PM      Result Value Range Status   Specimen Description URINE, CATHETERIZED   Final   Special Requests NONE   Final   Culture  Setup Time 08/01/2012  18:48   Final   Colony Count NO GROWTH   Final   Culture NO GROWTH   Final   Report Status 08/02/2012 FINAL   Final  CULTURE, BLOOD (ROUTINE X 2)     Status: None   Collection Time    08/04/12 10:00 AM      Result Value Range Status   Specimen Description BLOOD RIGHT ARM   Final   Special Requests BOTTLES DRAWN AEROBIC AND ANAEROBIC 10CC   Final   Culture  Setup Time 08/04/2012 15:19   Final   Culture     Final   Value:        BLOOD CULTURE RECEIVED NO GROWTH TO DATE CULTURE WILL BE HELD FOR 5 DAYS BEFORE ISSUING A FINAL NEGATIVE REPORT   Report Status PENDING   Incomplete  CULTURE, BLOOD (ROUTINE X 2)     Status: None   Collection Time    08/04/12 10:05 AM      Result Value Range Status   Specimen Description BLOOD RIGHT ARM   Final   Special Requests BOTTLES DRAWN AEROBIC AND ANAEROBIC 10CC   Final   Culture  Setup Time 08/04/2012 15:20   Final   Culture     Final   Value:        BLOOD CULTURE RECEIVED NO GROWTH TO DATE CULTURE WILL BE HELD FOR 5 DAYS BEFORE ISSUING A FINAL NEGATIVE REPORT   Report Status PENDING   Incomplete  URINE CULTURE     Status: None   Collection Time    08/04/12 11:32 AM      Result Value Range Status   Specimen Description URINE, CATHETERIZED   Final   Special Requests NONE   Final   Culture  Setup Time 08/04/2012 19:33   Final   Colony Count >=100,000 COLONIES/ML   Final   Culture YEAST   Final   Report Status 08/05/2012 FINAL   Final  CULTURE, BLOOD (ROUTINE X 2)     Status: None   Collection Time    08/08/12  4:22 PM      Result Value Range Status   Specimen Description BLOOD LEFT UPPER WRIST   Final   Special Requests BOTTLES DRAWN AEROBIC AND ANAEROBIC 10CC   Final   Culture  Setup Time 08/08/2012 20:36   Final   Culture     Final   Value:        BLOOD CULTURE RECEIVED NO GROWTH TO DATE CULTURE WILL BE HELD FOR 5 DAYS BEFORE ISSUING A FINAL NEGATIVE REPORT   Report  Status PENDING   Incomplete  CULTURE, BLOOD (ROUTINE X 2)     Status: None    Collection Time    08/08/12  4:39 PM      Result Value Range Status   Specimen Description BLOOD LEFT ARM   Final   Special Requests BOTTLES DRAWN AEROBIC AND ANAEROBIC 10CC   Final   Culture  Setup Time 08/08/2012 20:37   Final   Culture     Final   Value:        BLOOD CULTURE RECEIVED NO GROWTH TO DATE CULTURE WILL BE HELD FOR 5 DAYS BEFORE ISSUING A FINAL NEGATIVE REPORT   Report Status PENDING   Incomplete    Studies/Results: Dg Chest 2 View  08/04/2012   *RADIOLOGY REPORT*  Clinical Data: Fever and cough.  CHEST - 2 VIEW  Comparison: Single view of the chest 07/31/2012 and PA and lateral chest 07/27/2012.  Findings: The patient has very small bilateral pleural effusions seen on the lateral view.  No consolidative process, pneumothorax or effusion is identified.  Heart size is upper normal.  The patient is status post CABG.  IMPRESSION: Small bilateral pleural effusions.   Original Report Authenticated By: Orlean Patten, M.D.   Dg Chest 2 View  07/27/2012   *RADIOLOGY REPORT*  Clinical Data: Fever.  Left-sided chest pain.  Diabetes.  Coronary artery disease.  CHEST - 2 VIEW  Comparison: 07/20/2012  Findings: Low lung volumes and lordotic positioning noted.  Both lungs are clear.  No evidence of pleural effusion.  Heart size is within normal limits.  Prior CABG again noted.  IMPRESSION: No active disease.   Original Report Authenticated By: Earle Gell, M.D.   Dg Chest 2 View  07/20/2012   *RADIOLOGY REPORT*  Clinical Data: Fever.  CHEST - 2 VIEW  Comparison: 03/28/2006  Findings: Prior CABG.  Heart is normal size.  Lungs are clear.  No effusions or acute bony abnormality.  IMPRESSION: No acute cardiopulmonary disease.   Original Report Authenticated By: Rolm Baptise, M.D.   Ct Head Wo Contrast  07/27/2012   *RADIOLOGY REPORT*  Clinical Data:  Fall.  Head and neck injury and pain.  CT HEAD WITHOUT CONTRAST CT CERVICAL SPINE WITHOUT CONTRAST  Technique:  Multidetector CT imaging of the  head and cervical spine was performed following the standard protocol without intravenous contrast.  Multiplanar CT image reconstructions of the cervical spine were also generated.  Comparison:  Head CT on 04/27/2006  CT HEAD  Findings: There is no evidence of intracranial hemorrhage, brain edema or other signs of acute infarction.  There is no evidence of intracranial mass lesion or mass effect.  No abnormal extra-axial fluid collections are identified.  Ventricles remain normal in size.  Mild chronic small vessel disease is again seen.  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  No evidence of skull fracture or other bone lesion.  IMPRESSION:  1.  No acute intracranial findings. 2.  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  CT CERVICAL SPINE  Findings: No evidence of cervical spine fracture or subluxation. Degenerative disc disease is seen which is most pronounced at C6-7, and to lesser degrees at C4-5 and C5-6.  Mild facet DJD noted bilaterally as well as mild atlantoaxial degenerative changes.  No other significant bone abnormality identified.  IMPRESSION:  1.  No evidence of acute cervical spine fracture or subluxation. 2.  Degenerative  spondylosis as described.   Original Report Authenticated By: Earle Gell, M.D.   Ct Cervical Spine Wo Contrast  07/27/2012   *RADIOLOGY REPORT*  Clinical Data:  Fall.  Head and neck injury and pain.  CT HEAD WITHOUT CONTRAST CT CERVICAL SPINE WITHOUT CONTRAST  Technique:  Multidetector CT imaging of the head and cervical spine was performed following the standard protocol without intravenous contrast.  Multiplanar CT image reconstructions of the cervical spine were also generated.  Comparison:  Head CT on 04/27/2006  CT HEAD  Findings: There is no evidence of intracranial hemorrhage, brain edema or other signs of acute infarction.  There is no evidence of  intracranial mass lesion or mass effect.  No abnormal extra-axial fluid collections are identified.  Ventricles remain normal in size.  Mild chronic small vessel disease is again seen.  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  No evidence of skull fracture or other bone lesion.  IMPRESSION:  1.  No acute intracranial findings. 2.  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  CT CERVICAL SPINE  Findings: No evidence of cervical spine fracture or subluxation. Degenerative disc disease is seen which is most pronounced at C6-7, and to lesser degrees at C4-5 and C5-6.  Mild facet DJD noted bilaterally as well as mild atlantoaxial degenerative changes.  No other significant bone abnormality identified.  IMPRESSION:  1.  No evidence of acute cervical spine fracture or subluxation. 2.  Degenerative spondylosis as described.   Original Report Authenticated By: Earle Gell, M.D.   Mr Foot Right Wo Contrast  07/28/2012   *RADIOLOGY REPORT*  Clinical Data: Diabetes.  Gas in third toe.  Abscess.  MRI OF THE RIGHT FOREFOOT WITHOUT CONTRAST  Technique:  Multiplanar, multisequence MR imaging was performed. No intravenous contrast was administered.  Comparison: None.  Findings: 9.8 x 4.6 cm skin blistering noted along the dorsum of the foot.  Diffuse subcutaneous edema in the foot with diffuse edema within along the plantar musculature of the foot  Low signal intensity compatible with infiltrative gas in the subcutaneous tissues noted in the third toe, most appreciable on the T1-weighted images. Small linear gas signal intensities in the first and second intermetatarsal regions distally. Abnormal signal distal phalanx third toe compatible with osteomyelitis of the distal phalanx.  No other regions of osteomyelitis are observed in the forefoot.  Lisfranc ligament intact.  Subtle edema in the distal talus.  IMPRESSION:  1.   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 Trace amount of suspected gas in the distal first and second intermetatarsal spaces. 2.  Diffuse soft tissue infiltrative edema in the foot, involving its subcutaneous and muscular structures.  Appearance suggests cellulitis and myositis. 3.  Large blister along the dorsum of the foot. 4.  Subtle edema in the distal talus, likely reactive or due to mild stress reaction.   Original Report Authenticated By: Van Clines, M.D.   Dg Chest Port 1 View  08/09/2012   *RADIOLOGY REPORT*  Clinical Data: PICC placement.  PORTABLE CHEST - 1 VIEW  Comparison: PA and lateral chest 08/04/2012.  Findings: Right PICC is in place with the tip projecting over the lower superior vena cava.  Lungs are clear.  Heart size is upper normal.  No pneumothorax or pleural  fluid.  IMPRESSION: Tip of right PICC projects over the lower superior vena cava.  No acute abnormality.   Original Report Authenticated By: Orlean Patten, M.D.   Dg Chest Port 1 View  07/31/2012   *RADIOLOGY REPORT*  Clinical Data: Cough and fever, evaluate for pneumonia  PORTABLE CHEST - 1 VIEW  Comparison: 07/27/2012; 07/20/2012; 01/27/1999  Findings: Grossly unchanged cardiac silhouette and mediastinal contours given reduced lung volumes and patient rotation.  Post median sternotomy and CABG.  There is persistent mild elevation of the right hemidiaphragm.  No focal airspace opacities.  No pleural effusion or pneumothorax.  No definite evidence of edema. Unchanged bones.  IMPRESSION: No definite acute cardiopulmonary disease on this low lung volume AP portable exam. Further evaluation with a PA and lateral chest radiograph may be obtained as clinically indicated.   Original Report Authenticated By: Jake Seats, MD   Dg Foot Complete Right  07/27/2012   *RADIOLOGY  REPORT*  Clinical Data: Gangrene right third toe block and with blistering extending into the lateral metatarsal and tarsal region, swelling, history diabetes, recent fall yesterday, question osteomyelitis  RIGHT FOOT COMPLETE - 3+ VIEW  Comparison: None  Findings: Soft tissue gas throughout the third toe. Additional foci of soft tissue gas are seen adjacent to the third metatarsal head and between the second and first metatarsal heads. Bones appear demineralized. Diffuse soft tissue swelling. Joint spaces preserved. Due to superimposed soft tissue gas is difficult to visualize the cortex at the distal phalanx there is question of bone destruction involving the shaft of the distal phalanx. No additional fracture dislocation or bone destruction. Small plantar calcaneal spur.  IMPRESSION: Extensive soft tissue gas in the third toe extending to the region of the third metatarsal head and between the first and second metatarsal head consistent with infection by a gas forming organism. Questionable bone destruction at the distal phalanx of the third toe raising question of osteomyelitis.   Original Report Authenticated By: Lavonia Dana, M.D.    Medications: Scheduled Meds: . aspirin  81 mg Oral Daily  . cefTRIAXone (ROCEPHIN)  IV  2 g Intravenous Q24H  . enoxaparin (LOVENOX) injection  40 mg Subcutaneous Q24H  . famotidine  10 mg Oral BID  . hydrALAZINE  25 mg Oral Q8H  . insulin aspart  0-15 Units Subcutaneous TID WC  . insulin aspart  3 Units Subcutaneous TID WC  . insulin detemir  25 Units Subcutaneous Q24H  . metoprolol tartrate  25 mg Oral BID  . metroNIDAZOLE  500 mg Oral Q8H  . polyethylene glycol  17 g Oral Daily  . sodium chloride  3 mL Intravenous Q12H  . vancomycin  1,000 mg Intravenous Q24H      LOS: 13 days   Meloni Hinz M.D. [facility] 08/09/2012, 10:20 AM Pager: [phone]  If 7PM-7AM, please contact night-coverage [email or web address] Password [email or web address]

## 2012-08-09 NOTE — Progress Notes (Signed)
Awake, alert.  Reports minimal discomfort in right foot. Dressing with a small amount of bloody drainage on the lateral side.  A:  Necrotic right foot s/p transmetatarsal amputation  P: We discussed the options with the patient today.  Dr. Mayer Camel is recommending below the knee amputation.  The patient would like to avoid any further surgical intervention if at all possible.  We will try a course of IV antibiotics and reevaluate him in the office 1 week after discharge.

## 2012-08-09 NOTE — Clinical Social Work Psych Assess (Signed)
Clinical Social Work Department CLINICAL SOCIAL WORK PSYCHIATRY SERVICE LINE ASSESSMENT 08/09/2012  Patient:  [name]  Account:  [id number]  Admit Date:  07/27/2012  Clinical Social Worker:  Wylene Men  Date/Time:  08/09/2012 01:52 PM Referred by:  Physician  Date referred:  08/09/2012 Reason for Referral  Competency/Guardianship   Presenting Symptoms/Problems (In the person's/family's own words):   Psych was consulted for a capacity determination.   Abuse/Neglect/Trauma History (check all that apply)  Denies history   Abuse/Neglect/Trauma Comments:   none reported or charted  pt denies   Psychiatric History (check all that apply)  Denies history   Psychiatric medications:  none reported or charted  pt denies   Current Mental Health Hospitalizations/Previous Mental Health History:   none reported or charted  pt denies   Current provider:   unable to accurately assess   Place and Date:   unable to accurately assess   Current Medications:   Scheduled Meds:      . aspirin  81 mg Oral Daily  . cefTRIAXone (ROCEPHIN)  IV  2 g Intravenous Q24H  . enoxaparin (LOVENOX) injection  40 mg Subcutaneous Q24H  . famotidine  10 mg Oral BID  . hydrALAZINE  50 mg Oral Q8H  . insulin aspart  0-5 Units Subcutaneous QHS  . insulin aspart  0-9 Units Subcutaneous TID WC  . insulin detemir  15 Units Subcutaneous QHS  . metoprolol tartrate  25 mg Oral BID  . metroNIDAZOLE  500 mg Oral TID WC  . polyethylene glycol  17 g Oral Daily  . sodium chloride  3 mL Intravenous Q12H  . vancomycin  1,000 mg Intravenous Q24H        Continuous Infusions:      . sodium chloride 10 mL/hr at 08/07/12 1700    . sodium chloride 75 mL/hr at 08/08/12 1637          PRN Meds:.acetaminophen, acetaminophen, alum & mag hydroxide-simeth, dextrose, morphine injection, ondansetron (ZOFRAN) IV, ondansetron, oxyCODONE, sodium chloride       Previous Impatient Admission/Date/Reason:   last previous  admission shown on chart was 2008- medical admission   Emotional Health / Current Symptoms    Suicide/Self Harm  None reported   Suicide attempt in the past:   none reported or charted  pt denies   Other harmful behavior:   none reported or charted  pt denies   Psychotic/Dissociative Symptoms  None reported   Other Psychotic/Dissociative Symptoms:   none reported or charted  pt denies    Attention/Behavioral Symptoms  Within Normal Limits   Other Attention / Behavioral Symptoms:   none reported or charted  pt denies    Cognitive Impairment  Orientation - Place  Orientation - Self  Orientation - Situation  Orientation - Time   Other Cognitive Impairment:   none reported or charted  pt denies    Mood and Adjustment  Mood Congruent    Stress, Anxiety, Trauma, Any Recent Loss/Stressor  Anxiety   Anxiety (frequency):   pt shows some typical anxiety re: medical decision for BKA. Within normal limits   Phobia (specify):   none reported or charted  pt denies   Compulsive behavior (specify):   none reported or noted  pt denies   Obsessive behavior (specify):   none reported or noted  pt denies   Other:   none reported other than that listed above  pt denies   Substance Abuse/Use  None   SBIRT completed (please  refer for detailed history):  N  Self-reported substance use:   untested   Urinary Drug Screen Completed:  N Alcohol level:   untested     Who is in the home:   alone;   Emergency contact:  [name] sister [phone]   Financial  Medicare   Patient's Strengths and Goals (patient's own words):   Pt is in the hospital receiving care.  Pt feels he is supported by his sister, [name].   Clinical Social Worker's Interpretive Summary:   Psych was consulted for determination for capacity. Psychiatrist assessed pt and determined the pt has full capacity to make his own healthcare decisions.  This was documented clearly and psych CSW  informed unit.  No other psych needs identified.   Disposition:  Psych Clinical Social Worker signing off  Nonnie Done, [place] [phone]  Clinical Social Work

## 2012-08-09 NOTE — Progress Notes (Signed)
Patient has had 3 episodes of emesis. Patient has had 1 IV dose and 1 PO dose. Paged Dr. Tana Coast and text paged Dr. Tana Coast to notify doctor. No return call as yet, going to administer second dose of Zofran IV. Glade Nurse, RN

## 2012-08-10 DIAGNOSIS — R509 Fever, unspecified: Secondary | ICD-10-CM

## 2012-08-10 DIAGNOSIS — R110 Nausea: Secondary | ICD-10-CM

## 2012-08-10 LAB — DIFFERENTIAL
Basophils Absolute: 0 10*3/uL (ref 0.0–0.1)
Basophils Relative: 0 % (ref 0–1)
Eosinophils Absolute: 0.1 10*3/uL (ref 0.0–0.7)
Monocytes Relative: 9 % (ref 3–12)
Neutrophils Relative %: 78 % — ABNORMAL HIGH (ref 43–77)

## 2012-08-10 LAB — CULTURE, BLOOD (ROUTINE X 2): Culture: NO GROWTH

## 2012-08-10 LAB — BASIC METABOLIC PANEL
Calcium: 8.2 mg/dL — ABNORMAL LOW (ref 8.4–10.5)
Creatinine, Ser: 1.31 mg/dL (ref 0.50–1.35)
GFR calc Af Amer: 66 mL/min — ABNORMAL LOW (ref >90)
GFR calc non Af Amer: 57 mL/min — ABNORMAL LOW (ref >90)

## 2012-08-10 LAB — GLUCOSE, CAPILLARY
Glucose-Capillary: 176 mg/dL — ABNORMAL HIGH (ref 70–99)
Glucose-Capillary: 135 mg/dL — ABNORMAL HIGH (ref 70–99)
Glucose-Capillary: 144 mg/dL — ABNORMAL HIGH (ref 70–99)

## 2012-08-10 MED ORDER — PIPERACILLIN-TAZOBACTAM 3.375 G IVPB
3.3750 g | Freq: Three times a day (TID) | INTRAVENOUS | Status: DC
  Administered 2012-08-10 – 2012-08-11 (×3): 3.375 g via INTRAVENOUS
  Filled 2012-08-10 (×6): qty 50

## 2012-08-10 MED ORDER — INSULIN ASPART 100 UNIT/ML ~~LOC~~ SOLN
4.0000 [IU] | Freq: Three times a day (TID) | SUBCUTANEOUS | Status: DC
  Administered 2012-08-10 – 2012-08-11 (×2): 4 [IU] via SUBCUTANEOUS

## 2012-08-10 NOTE — Progress Notes (Addendum)
Inpatient Diabetes Program Recommendations  AACE/ADA: New Consensus Statement on Inpatient Glycemic Control (2013)  Target Ranges:  Prepandial:   less than 140 mg/dL      Peak postprandial:   less than 180 mg/dL (1-2 hours)      Critically ill patients:  140 - 180 mg/dL  Results for ARAGORN, MENDEZ (MRN [id number]) as of 08/10/2012 12:55  Ref. Range 08/09/2012 21:15 08/10/2012 06:25 08/10/2012 11:27  Glucose-Capillary Latest Range: 70-99 mg/dL 232 (H) 176 (H) 259 (H)   Inpatient Diabetes Program Recommendations Insulin - Basal: . Insulin - Meal Coverage: add Novolog 3 units TID with meals for elevated postprandials (noted already ordered) Thank you  Raoul Pitch BSN, RN,CDE Inpatient Diabetes Coordinator [phone] (team pager)

## 2012-08-10 NOTE — Progress Notes (Signed)
Patient ID: [name], male   DOB: Jun 28, 1950, 62 y.o.   MRN: [id number] Subjective: Patient examined at bedside today.Patient reports that pain is 2 on a scale of 10, there is some tingling in his foot.  At this time, he still does not want to have any further surgery, and is aware that the necrosis to the midfoot is irreversible.  Objective: The dressing that has been in place for the last 16 hours is removed and there is little if any drainage.  The area of necrosis, going from the metatarsal amputation proximally for 2 inches is stable dry, and I cannot express any pus from the suture line.  The wound itself was not healing secondary to necrosis of the tissues.  He does not seem to have any pain or discomfort Y palpate the foot.  Assessment: Right foot necrosis, secondary to infection, secondary to diabetic ketoacidosisOn admission 07/27/2012.  At this time.  Necrosis is stable and relatively dry.  Recommendation: Below-the-knee amputation is indicated at this time, the patient is not amenable to any further surgery at this time.  Discussed the risks of continued nonsurgical treatment, which include ascending infection, sepsis, and possible death.  Having said that, the patient wants to continue dressing changes and IV antibiotics at this time.  Once again, he is aware that this method of treatment will not work. I will be out of town through Monday 07/14/2012.  My partner, Phylliss Bob will be providing coverage if needed.  Frederik Pear [phone]

## 2012-08-10 NOTE — Clinical Social Work Note (Signed)
Clinical Social Worker updated [facility] on anticipated discharge tomorrow. CSW is continuing to follow and will facilitate discharge when medically stable.  Leandro Reasoner MSW, [place]

## 2012-08-10 NOTE — Progress Notes (Addendum)
[facility]    Date of Admission:  07/27/2012   Total days of antibiotics 15        Day 3 ceftriaxone        Day 3 metronidazole        Day 14 vanco   ID: [name] is a 62 y.o. 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 Currently on vancomycin, ceftriaxone, and metronidazole  Principal Problem:   Gangrene of foot Active Problems:   DKA (diabetic ketoacidoses)   Sepsis   S/P CABG x 4   H/O: CVA (cerebrovascular accident)   Tobacco abuse   Alcohol abuse   Hypertension   Anemia   DM (diabetes mellitus) type 2, uncontrolled, with ketoacidosis    Subjective: Febrile again yesterday for the past 2 days , had repeated events of  nausea and vomiting. Seen by ortho who recommend bka at this time  Medications:  . aspirin  81 mg Oral Daily  . cefTRIAXone (ROCEPHIN)  IV  2 g Intravenous Q24H  . enoxaparin (LOVENOX) injection  40 mg Subcutaneous Q24H  . hydrALAZINE  50 mg Oral Q8H  . insulin aspart  0-5 Units Subcutaneous QHS  . insulin aspart  0-9 Units Subcutaneous TID WC  . insulin aspart  4 Units Subcutaneous TID WC  . insulin detemir  15 Units Subcutaneous QHS  . metoCLOPramide (REGLAN) injection  5 mg Intravenous Q8H  . metoprolol tartrate  25 mg Oral BID  . metronidazole  500 mg Intravenous Q8H  . pantoprazole (PROTONIX) IV  40 mg Intravenous Q12H  . polyethylene glycol  17 g Oral Daily  . sodium chloride  3 mL Intravenous Q12H  . vancomycin  1,000 mg Intravenous Q24H    Objective: Vital signs in last 24 hours: Temp:  [98.5 F (36.9 C)-101.8 F (38.8 C)] 98.5 F (36.9 C) (06/05 1307) Pulse Rate:  [88-114] 88 (06/05 1307) Resp:  [16-19] 19 (06/05 1307) BP: (134-163)/(60-84) 134/60 mmHg (06/05 1307) SpO2:  [96 %-100 %] 98 % (06/05 1307)   Physical Exam  Constitutional: He is oriented to person, place, and time. He appears well-developed and  well-nourished. No distress.  HENT:  Mouth/Throat: Oropharynx is clear and moist. No oropharyngeal exudate.  Cardiovascular: Normal rate, regular rhythm and normal heart sounds. Exam reveals no gallop and no friction rub.  3/6SEM BH LUSB  Pulmonary/Chest: Effort normal and breath sounds normal. No respiratory distress. He has no wheezes.  Abdominal: Soft. Bowel sounds are normal. He exhibits no distension. There is no tenderness.  Lymphadenopathy: no cervical adenopathy.  Neurological: He is alert and oriented to person, place, and time.  Ext: right foot dressed from amputation  Skin: Skin is warm and dry. No rash noted. No erythema.  Psychiatric: He has a normal mood and affect. His behavior is normal.   Lab Results  Recent Labs  08/08/12 0920 08/09/12 0735 08/10/12 0520  WBC 20.4* 16.9*  --   HGB 6.7* 9.3*  --   HCT 20.5* 27.1*  --   NA  --  137 135  K  --  4.0 3.6  CL  --  105 103  CO2  --  24 25  BUN  --  11 12  CREATININE  --  1.22 1.31   Liver Panel  Recent Labs  08/08/12 1145  PROT 6.7  ALBUMIN 1.5*  AST 26  ALT 16  ALKPHOS 168*  BILITOT 2.0*  BILIDIR 1.3*  IBILI 0.7   Sedimentation Rate  Recent Labs  08/08/12 1145  ESRSEDRATE >140*   C-Reactive Protein  Recent Labs  08/08/12 1145  CRP 24.0*    Microbiology: 6/3 blood cx x 2 NGTD  Studies/Results: Dg Abd 1 View  08/09/2012   *RADIOLOGY REPORT*  Clinical Data: Abdominal pain and vomiting.  ABDOMEN - 1 VIEW  Comparison: None.  Findings: Bowel gas pattern is unremarkable.  No abnormal abdominal calcification is seen.  There is some enthesopathic change about the pelvis.  IMPRESSION: No acute abnormality.   Original Report Authenticated By: Orlean Patten, M.D.   Dg Chest Port 1 View  08/09/2012   *RADIOLOGY REPORT*  Clinical Data: PICC placement.  PORTABLE CHEST - 1 VIEW  Comparison: PA and lateral chest 08/04/2012.  Findings: Right PICC is in place with the tip projecting over the lower superior  vena cava.  Lungs are clear.  Heart size is upper normal.  No pneumothorax or pleural fluid.  IMPRESSION: Tip of right PICC projects over the lower superior vena cava.  No acute abnormality.   Original Report Authenticated By: Orlean Patten, M.D.     Assessment/Plan: 62 yo Male with osteomyelitis to right foot s/p TMA however has poor wound healing likely requiring bka, currently on IV antibiotics, had fever to 101.8 yesterday  1) fever : recommend to wait 48hr until blood cx from 6/3 are negative to place picc line. It presently appears that his fevers are attributed to his osteomyelitis and poor wound healing, no new sources of infection identifed  2) osteo:  I am concerned that he will need bka sooner than later if he continues to have intermittent fevers and leukocytosis. We still do not have good source control. However, patient is unwilling to get BKA. Surgery has discussed risk and that IV antibiotics alone will not treat necrosis process  Currently recommend 6 wk of IV vancomycin and piptazo 3.375 TID  . Use antibiotic start date of 5/29 and end on July 10th.    - will need home health to check weekly cbc, cmp, and vanco trough, goal 15-20, adjust per protocol  - If patient has BKA, may decrease this course of therapy and consider oral regimen for 7-10 days.    3) nausea = possibly due to metronidazole, will switch back to piptazo,   - will sign off and arrange follow up in ID clinic in 5-6 wk.   Baxter Flattery [facility] for Infectious Diseases Cell: [phone] Pager: [phone]  08/10/2012, 1:13 PM

## 2012-08-10 NOTE — Progress Notes (Signed)
Occupational Therapy Treatment Patient Details Name: [name] MRN: [id number] DOB: 04/03/1950 Today's Date: 08/10/2012 Time: [id number] OT Time Calculation (min): 23 min  OT Assessment / Plan / Recommendation    Follow Up Recommendations  SNF       Equipment Recommendations  None recommended by OT       Frequency Min 2X/week   Plan Discharge plan remains appropriate    Precautions / Restrictions Precautions Precautions: Fall Precaution Comments: cognitive deficits Required Braces or Orthoses: Other Brace/Splint Other Brace/Splint: Darco shoe Restrictions Weight Bearing Restrictions: Yes RLE Weight Bearing: Partial weight bearing RLE Partial Weight Bearing Percentage or Pounds: 50%       ADL  Grooming: Performed;Wash/dry hands;Wash/dry face;Minimal assistance Where Assessed - Grooming: Supported standing Upper Body Bathing: Performed;Set up Where Assessed - Upper Body Bathing: Unsupported sitting Lower Body Bathing: Performed;Minimal assistance Where Assessed - Lower Body Bathing: Supported sit to stand Upper Body Dressing: Minimal assistance Where Assessed - Upper Body Dressing: Unsupported sitting Lower Body Dressing: Performed;Moderate assistance Where Assessed - Lower Body Dressing: Supported sit to Lobbyist: Performed;Moderate assistance Toilet Transfer Method: Sit to stand;Other (comment) (sit to stand with urinal) Toilet Transfer Equipment: Other (comment) (urinal) Toileting - Clothing Manipulation and Hygiene: Performed;Minimal assistance Where Assessed - Toileting Clothing Manipulation and Hygiene: Standing ADL Comments: Pt did well with wash up. Pt did need verbal cues for safety with hand placement with sit to stand      OT Goals ADL Goals ADL Goal: Lower Body Bathing - Progress: Progressing toward goals ADL Goal: Lower Body Dressing - Progress: Progressing toward goals ADL Goal: Toilet Transfer - Progress: Progressing toward goals ADL  Goal: Toileting - Clothing Manipulation - Progress: Progressing toward goals ADL Goal: Toileting - Hygiene - Progress: Progressing toward goals ADL Goal: Additional Goal #1 - Progress: Progressing toward goals  Visit Information  Last OT Received On: 08/10/12 Assistance Needed: +1    Subjective Data  Subjective: I would like to do a wash up      Cognition  Cognition Arousal/Alertness: Awake/alert Behavior During Therapy: WFL for tasks assessed/performed Overall Cognitive Status: Within Functional Limits for tasks assessed General Comments: Pt with good participation this OT visit    Mobility  Transfers Transfers: Sit to Stand;Stand to Sit Sit to Stand: 4: Min assist;With armrests;With upper extremity assist;From chair/3-in-1 Stand to Sit: 4: Min assist;With upper extremity assist;To chair/3-in-1          End of Session OT - End of Session Activity Tolerance: Patient tolerated treatment well Patient left: in chair;with call bell/phone within reach  [name], Thereasa Parkin 08/10/2012, 10:06 AM

## 2012-08-10 NOTE — Progress Notes (Signed)
Patient ID: [name]  male  [id number]    DOB: 11-12-1950    DOA: 07/27/2012  PCP: No PCP Per Patient  Assessment/Plan: Principal Problem:   Gangrene of toe with sepsis - Recommended BKA by orthopedics, patient has been refusing surgery - Patient was evaluated by psychiatry, patient meets criteria for making his own healthcare decisions - Continue IV Vanc, rocephin and flagyl for 6 weeks, PICC line placed on 6/4, followup orthopedics in one week - If patient is able to tolerate diet, will change Flagyl to oral  Active Problems:  Nausea and vomiting: Resolved - Advance diet to carb modified diet, continue Reglan    DKA (diabetic ketoacidoses) with hypoglycemia on 6/4, now with hyperglycemia  - continue Levemir 15 units,  restarted meal coverage - continue SSI to sensitive    S/P CABG x 4 - out-patient follow-up with dr Terrence Dupont    H/O: CVA (cerebrovascular accident)    Tobacco abuse/   Alcohol abuse:  - patient counseled to quit     Hypertension:  -  continue hydralazine    Anemia; - Transfuse 2 units packed RBCs    DM (diabetes mellitus) type 2, uncontrolled, with ketoacidosis: As above   Yeast UTI: has completed diflucan  DVT Prophylaxis: lovenox  Code Status:  Disposition:  tomorrow if tolerating oral diet and remains stable   Subjective: no nausea and vomiting today, tolerating liquid diet, and wants to try solids today   Objective: Weight change:   Intake/Output Summary (Last 24 hours) at 08/10/12 1155 Last data filed at 08/10/12 0730  Gross per 24 hour  Intake    720 ml  Output   1751 ml  Net  -1031 ml   Blood pressure 154/74, pulse 96, temperature 99.1 F (37.3 C), temperature source Oral, resp. rate 16, height 5\' 11"  (1.803 m), weight 88.9 kg (195 lb 15.8 oz), SpO2 97.00%.  Physical Exam: General: A x O x3, NAD CVS: S1-S2 clear, no murmur rubs or gallops Chest: CTAB Abdomen: soft NT, ND, NBS  Extremities: no cyanosis, clubbing, right foot  dressing intact     Lab Results: Basic Metabolic Panel:  Recent Labs Lab 08/09/12 0735 08/10/12 0520  NA 137 135  K 4.0 3.6  CL 105 103  CO2 24 25  GLUCOSE 99 216*  BUN 11 12  CREATININE 1.22 1.31  CALCIUM 8.9 8.2*   Liver Function Tests:  Recent Labs Lab 08/08/12 1145  AST 26  ALT 16  ALKPHOS 168*  BILITOT 2.0*  PROT 6.7  ALBUMIN 1.5*   No results found for this basename: LIPASE, AMYLASE,  in the last 168 hours  Recent Labs Lab 08/08/12 1145  AMMONIA 19   CBC:  Recent Labs Lab 08/08/12 0920  08/09/12 0735 08/10/12 0520  WBC 20.4*  --  16.9*  --   NEUTROABS  --   < > 14.6* 13.5*  HGB 6.7*  --  9.3*  --   HCT 20.5*  --  27.1*  --   MCV 83.0  --  81.9  --   PLT 530*  --  517*  --   < > = values in this interval not displayed. Cardiac Enzymes: No results found for this basename: CKTOTAL, CKMB, CKMBINDEX, TROPONINI,  in the last 168 hours BNP: No components found with this basename: POCBNP,  CBG:  Recent Labs Lab 08/09/12 1101 08/09/12 1626 08/09/12 2115 08/10/12 0625 08/10/12 1127  GLUCAP 131* 163* 232* 176* 259*     Micro Results:  Recent Results (from the past 240 hour(s))  CULTURE, BLOOD (ROUTINE X 2)     Status: None   Collection Time    07/31/12  2:37 PM      Result Value Range Status   Specimen Description BLOOD RIGHT ARM   Final   Special Requests BOTTLES DRAWN AEROBIC ONLY 10CC   Final   Culture  Setup Time 07/31/2012 23:11   Final   Culture NO GROWTH 5 DAYS   Final   Report Status 08/06/2012 FINAL   Final  CULTURE, BLOOD (ROUTINE X 2)     Status: None   Collection Time    07/31/12  3:15 PM      Result Value Range Status   Specimen Description BLOOD RIGHT ARM   Final   Special Requests BOTTLES DRAWN AEROBIC AND ANAEROBIC 10CC   Final   Culture  Setup Time 07/31/2012 23:12   Final   Culture NO GROWTH 5 DAYS   Final   Report Status 08/06/2012 FINAL   Final  URINE CULTURE     Status: None   Collection Time    08/01/12  6:07  PM      Result Value Range Status   Specimen Description URINE, CATHETERIZED   Final   Special Requests NONE   Final   Culture  Setup Time 08/01/2012 18:48   Final   Colony Count NO GROWTH   Final   Culture NO GROWTH   Final   Report Status 08/02/2012 FINAL   Final  CULTURE, BLOOD (ROUTINE X 2)     Status: None   Collection Time    08/04/12 10:00 AM      Result Value Range Status   Specimen Description BLOOD RIGHT ARM   Final   Special Requests BOTTLES DRAWN AEROBIC AND ANAEROBIC 10CC   Final   Culture  Setup Time 08/04/2012 15:19   Final   Culture NO GROWTH 5 DAYS   Final   Report Status 08/10/2012 FINAL   Final  CULTURE, BLOOD (ROUTINE X 2)     Status: None   Collection Time    08/04/12 10:05 AM      Result Value Range Status   Specimen Description BLOOD RIGHT ARM   Final   Special Requests BOTTLES DRAWN AEROBIC AND ANAEROBIC 10CC   Final   Culture  Setup Time 08/04/2012 15:20   Final   Culture     Final   Value:        BLOOD CULTURE RECEIVED NO GROWTH TO DATE CULTURE WILL BE HELD FOR 5 DAYS BEFORE ISSUING A FINAL NEGATIVE REPORT   Report Status PENDING   Incomplete  URINE CULTURE     Status: None   Collection Time    08/04/12 11:32 AM      Result Value Range Status   Specimen Description URINE, CATHETERIZED   Final   Special Requests NONE   Final   Culture  Setup Time 08/04/2012 19:33   Final   Colony Count >=100,000 COLONIES/ML   Final   Culture YEAST   Final   Report Status 08/05/2012 FINAL   Final  CULTURE, BLOOD (ROUTINE X 2)     Status: None   Collection Time    08/08/12  4:22 PM      Result Value Range Status   Specimen Description BLOOD LEFT UPPER WRIST   Final   Special Requests BOTTLES DRAWN AEROBIC AND ANAEROBIC 10CC   Final   Culture  Setup Time 08/08/2012 20:36  Final   Culture     Final   Value:        BLOOD CULTURE RECEIVED NO GROWTH TO DATE CULTURE WILL BE HELD FOR 5 DAYS BEFORE ISSUING A FINAL NEGATIVE REPORT   Report Status PENDING   Incomplete   CULTURE, BLOOD (ROUTINE X 2)     Status: None   Collection Time    08/08/12  4:39 PM      Result Value Range Status   Specimen Description BLOOD LEFT ARM   Final   Special Requests BOTTLES DRAWN AEROBIC AND ANAEROBIC 10CC   Final   Culture  Setup Time 08/08/2012 20:37   Final   Culture     Final   Value:        BLOOD CULTURE RECEIVED NO GROWTH TO DATE CULTURE WILL BE HELD FOR 5 DAYS BEFORE ISSUING A FINAL NEGATIVE REPORT   Report Status PENDING   Incomplete    Studies/Results: Dg Chest 2 View  08/04/2012   *RADIOLOGY REPORT*  Clinical Data: Fever and cough.  CHEST - 2 VIEW  Comparison: Single view of the chest 07/31/2012 and PA and lateral chest 07/27/2012.  Findings: The patient has very small bilateral pleural effusions seen on the lateral view.  No consolidative process, pneumothorax or effusion is identified.  Heart size is upper normal.  The patient is status post CABG.  IMPRESSION: Small bilateral pleural effusions.   Original Report Authenticated By: Orlean Patten, M.D.   Dg Chest 2 View  07/27/2012   *RADIOLOGY REPORT*  Clinical Data: Fever.  Left-sided chest pain.  Diabetes.  Coronary artery disease.  CHEST - 2 VIEW  Comparison: 07/20/2012  Findings: Low lung volumes and lordotic positioning noted.  Both lungs are clear.  No evidence of pleural effusion.  Heart size is within normal limits.  Prior CABG again noted.  IMPRESSION: No active disease.   Original Report Authenticated By: Earle Gell, M.D.   Dg Chest 2 View  07/20/2012   *RADIOLOGY REPORT*  Clinical Data: Fever.  CHEST - 2 VIEW  Comparison: 03/28/2006  Findings: Prior CABG.  Heart is normal size.  Lungs are clear.  No effusions or acute bony abnormality.  IMPRESSION: No acute cardiopulmonary disease.   Original Report Authenticated By: Rolm Baptise, M.D.   Ct Head Wo Contrast  07/27/2012   *RADIOLOGY REPORT*  Clinical Data:  Fall.  Head and neck injury and pain.  CT HEAD WITHOUT CONTRAST CT CERVICAL SPINE WITHOUT CONTRAST   Technique:  Multidetector CT imaging of the head and cervical spine was performed following the standard protocol without intravenous contrast.  Multiplanar CT image reconstructions of the cervical spine were also generated.  Comparison:  Head CT on 04/27/2006  CT HEAD  Findings: There is no evidence of intracranial hemorrhage, brain edema or other signs of acute infarction.  There is no evidence of intracranial mass lesion or mass effect.  No abnormal extra-axial fluid collections are identified.  Ventricles remain normal in size.  Mild chronic small vessel disease is again seen.  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  No evidence of skull fracture or other bone lesion.  IMPRESSION:  1.  No acute intracranial findings. 2.  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  CT CERVICAL SPINE  Findings: No evidence of cervical spine fracture or subluxation. Degenerative disc disease is seen which is most pronounced at C6-7, and to lesser  degrees at C4-5 and C5-6.  Mild facet DJD noted bilaterally as well as mild atlantoaxial degenerative changes.  No other significant bone abnormality identified.  IMPRESSION:  1.  No evidence of acute cervical spine fracture or subluxation. 2.  Degenerative spondylosis as described.   Original Report Authenticated By: Earle Gell, M.D.   Ct Cervical Spine Wo Contrast  07/27/2012   *RADIOLOGY REPORT*  Clinical Data:  Fall.  Head and neck injury and pain.  CT HEAD WITHOUT CONTRAST CT CERVICAL SPINE WITHOUT CONTRAST  Technique:  Multidetector CT imaging of the head and cervical spine was performed following the standard protocol without intravenous contrast.  Multiplanar CT image reconstructions of the cervical spine were also generated.  Comparison:  Head CT on 04/27/2006  CT HEAD  Findings: There is no evidence of intracranial hemorrhage, brain edema or other signs of acute  infarction.  There is no evidence of intracranial mass lesion or mass effect.  No abnormal extra-axial fluid collections are identified.  Ventricles remain normal in size.  Mild chronic small vessel disease is again seen.  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  No evidence of skull fracture or other bone lesion.  IMPRESSION:  1.  No acute intracranial findings. 2.  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  CT CERVICAL SPINE  Findings: No evidence of cervical spine fracture or subluxation. Degenerative disc disease is seen which is most pronounced at C6-7, and to lesser degrees at C4-5 and C5-6.  Mild facet DJD noted bilaterally as well as mild atlantoaxial degenerative changes.  No other significant bone abnormality identified.  IMPRESSION:  1.  No evidence of acute cervical spine fracture or subluxation. 2.  Degenerative spondylosis as described.   Original Report Authenticated By: Earle Gell, M.D.   Mr Foot Right Wo Contrast  07/28/2012   *RADIOLOGY REPORT*  Clinical Data: Diabetes.  Gas in third toe.  Abscess.  MRI OF THE RIGHT FOREFOOT WITHOUT CONTRAST  Technique:  Multiplanar, multisequence MR imaging was performed. No intravenous contrast was administered.  Comparison: None.  Findings: 9.8 x 4.6 cm skin blistering noted along the dorsum of the foot.  Diffuse subcutaneous edema in the foot with diffuse edema within along the plantar musculature of the foot  Low signal intensity compatible with infiltrative gas in the subcutaneous tissues noted in the third toe, most appreciable on the T1-weighted images. Small linear gas signal intensities in the first and second intermetatarsal regions distally. Abnormal signal distal phalanx third toe compatible with osteomyelitis of the distal phalanx.  No other regions of osteomyelitis are observed in the forefoot.  Lisfranc ligament intact.  Subtle edema  in the distal talus.  IMPRESSION:  1.  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 Trace amount of suspected gas in the distal first and second intermetatarsal spaces. 2.  Diffuse soft tissue infiltrative edema in the foot, involving its subcutaneous and muscular structures.  Appearance suggests cellulitis and myositis. 3.  Large blister along the dorsum of the foot. 4.  Subtle edema in the distal talus, likely reactive or due to mild stress reaction.   Original Report Authenticated By: Van Clines, M.D.   Dg Chest Port 1 View  08/09/2012   *RADIOLOGY REPORT*  Clinical Data: PICC placement.  PORTABLE CHEST -  1 VIEW  Comparison: PA and lateral chest 08/04/2012.  Findings: Right PICC is in place with the tip projecting over the lower superior vena cava.  Lungs are clear.  Heart size is upper normal.  No pneumothorax or pleural fluid.  IMPRESSION: Tip of right PICC projects over the lower superior vena cava.  No acute abnormality.   Original Report Authenticated By: Orlean Patten, M.D.   Dg Chest Port 1 View  07/31/2012   *RADIOLOGY REPORT*  Clinical Data: Cough and fever, evaluate for pneumonia  PORTABLE CHEST - 1 VIEW  Comparison: 07/27/2012; 07/20/2012; 01/27/1999  Findings: Grossly unchanged cardiac silhouette and mediastinal contours given reduced lung volumes and patient rotation.  Post median sternotomy and CABG.  There is persistent mild elevation of the right hemidiaphragm.  No focal airspace opacities.  No pleural effusion or pneumothorax.  No definite evidence of edema. Unchanged bones.  IMPRESSION: No definite acute cardiopulmonary disease on this low lung volume AP portable exam. Further evaluation with a PA and lateral chest radiograph may be obtained as clinically indicated.   Original Report Authenticated By: Jake Seats, MD   Dg Foot  Complete Right  07/27/2012   *RADIOLOGY REPORT*  Clinical Data: Gangrene right third toe block and with blistering extending into the lateral metatarsal and tarsal region, swelling, history diabetes, recent fall yesterday, question osteomyelitis  RIGHT FOOT COMPLETE - 3+ VIEW  Comparison: None  Findings: Soft tissue gas throughout the third toe. Additional foci of soft tissue gas are seen adjacent to the third metatarsal head and between the second and first metatarsal heads. Bones appear demineralized. Diffuse soft tissue swelling. Joint spaces preserved. Due to superimposed soft tissue gas is difficult to visualize the cortex at the distal phalanx there is question of bone destruction involving the shaft of the distal phalanx. No additional fracture dislocation or bone destruction. Small plantar calcaneal spur.  IMPRESSION: Extensive soft tissue gas in the third toe extending to the region of the third metatarsal head and between the first and second metatarsal head consistent with infection by a gas forming organism. Questionable bone destruction at the distal phalanx of the third toe raising question of osteomyelitis.   Original Report Authenticated By: Lavonia Dana, M.D.    Medications: Scheduled Meds: . aspirin  81 mg Oral Daily  . cefTRIAXone (ROCEPHIN)  IV  2 g Intravenous Q24H  . enoxaparin (LOVENOX) injection  40 mg Subcutaneous Q24H  . hydrALAZINE  50 mg Oral Q8H  . insulin aspart  0-5 Units Subcutaneous QHS  . insulin aspart  0-9 Units Subcutaneous TID WC  . insulin detemir  15 Units Subcutaneous QHS  . metoCLOPramide (REGLAN) injection  5 mg Intravenous Q8H  . metoprolol tartrate  25 mg Oral BID  . metronidazole  500 mg Intravenous Q8H  . pantoprazole (PROTONIX) IV  40 mg Intravenous Q12H  . polyethylene glycol  17 g Oral Daily  . sodium chloride  3 mL Intravenous Q12H  . vancomycin  1,000 mg Intravenous Q24H      LOS: 14 days   RAI,RIPUDEEP M.D. [facility] 08/10/2012, 11:55 AM Pager: [phone]  If 7PM-7AM, please contact night-coverage [email or web address] Password [email or web address]

## 2012-08-11 DIAGNOSIS — E1110 Type 2 diabetes mellitus with ketoacidosis without coma: Secondary | ICD-10-CM

## 2012-08-11 DIAGNOSIS — R0989 Other specified symptoms and signs involving the circulatory and respiratory systems: Secondary | ICD-10-CM

## 2012-08-11 LAB — BASIC METABOLIC PANEL
CO2: 25 mEq/L (ref 19–32)
Chloride: 99 mEq/L (ref 96–112)
Creatinine, Ser: 1.34 mg/dL (ref 0.50–1.35)
Glucose, Bld: 177 mg/dL — ABNORMAL HIGH (ref 70–99)

## 2012-08-11 LAB — CBC
Hemoglobin: 7.4 g/dL — ABNORMAL LOW (ref 13.0–17.0)
MCH: 28 pg (ref 26.0–34.0)
RBC: 2.64 MIL/uL — ABNORMAL LOW (ref 4.22–5.81)
WBC: 15.1 10*3/uL — ABNORMAL HIGH (ref 4.0–10.5)

## 2012-08-11 LAB — DIFFERENTIAL
Basophils Absolute: 0.1 10*3/uL (ref 0.0–0.1)
Basophils Relative: 0 % (ref 0–1)
Eosinophils Relative: 1 % (ref 0–5)
Lymphs Abs: 2.4 10*3/uL (ref 0.7–4.0)
Monocytes Relative: 9 % (ref 3–12)
Neutrophils Relative %: 74 % (ref 43–77)

## 2012-08-11 LAB — PREPARE RBC (CROSSMATCH)

## 2012-08-11 LAB — VANCOMYCIN, TROUGH: Vancomycin Tr: 10.9 ug/mL (ref 10.0–20.0)

## 2012-08-11 LAB — GLUCOSE, CAPILLARY: Glucose-Capillary: 177 mg/dL — ABNORMAL HIGH (ref 70–99)

## 2012-08-11 LAB — HEMOGLOBIN AND HEMATOCRIT, BLOOD
HCT: 26.3 % — ABNORMAL LOW (ref 39.0–52.0)
Hemoglobin: 8.7 g/dL — ABNORMAL LOW (ref 13.0–17.0)

## 2012-08-11 MED ORDER — PIPERACILLIN-TAZOBACTAM 3.375 G IVPB: 3.3750 g | Freq: Three times a day (TID) | INTRAVENOUS | Status: DC

## 2012-08-11 MED ORDER — HEPARIN SOD (PORK) LOCK FLUSH 100 UNIT/ML IV SOLN
250.0000 [IU] | INTRAVENOUS | Status: AC | PRN
  Administered 2012-08-11: 250 [IU]

## 2012-08-11 MED ORDER — HYDRALAZINE HCL 50 MG PO TABS: 50.0000 mg | ORAL_TABLET | Freq: Three times a day (TID) | ORAL | Status: DC

## 2012-08-11 MED ORDER — VANCOMYCIN HCL 10 G IV SOLR
1500.0000 mg | INTRAVENOUS | Status: DC
  Filled 2012-08-11: qty 1500

## 2012-08-11 MED ORDER — VANCOMYCIN HCL 10 G IV SOLR: 1500.0000 mg | INTRAVENOUS | Status: DC

## 2012-08-11 MED ORDER — ACETAMINOPHEN 325 MG PO TABS: 650.0000 mg | ORAL_TABLET | Freq: Four times a day (QID) | ORAL | Status: DC | PRN

## 2012-08-11 MED ORDER — PANTOPRAZOLE SODIUM 40 MG PO TBEC: 40.0000 mg | DELAYED_RELEASE_TABLET | Freq: Every day | ORAL | Status: DC

## 2012-08-11 MED ORDER — INSULIN DETEMIR 100 UNIT/ML ~~LOC~~ SOLN: 15.0000 [IU] | Freq: Every day | SUBCUTANEOUS | Status: DC

## 2012-08-11 MED ORDER — INSULIN ASPART 100 UNIT/ML ~~LOC~~ SOLN: 0.0000 [IU] | Freq: Three times a day (TID) | SUBCUTANEOUS | Status: DC

## 2012-08-11 MED ORDER — INSULIN ASPART 100 UNIT/ML ~~LOC~~ SOLN: 4.0000 [IU] | Freq: Three times a day (TID) | SUBCUTANEOUS | Status: DC

## 2012-08-11 MED ORDER — ONDANSETRON HCL 4 MG PO TABS: 4.0000 mg | ORAL_TABLET | Freq: Four times a day (QID) | ORAL | Status: DC | PRN

## 2012-08-11 MED ORDER — OXYCODONE HCL 5 MG PO TABS: 5.0000 mg | ORAL_TABLET | ORAL | Status: DC | PRN

## 2012-08-11 MED ORDER — ASPIRIN 81 MG PO TBEC: 81.0000 mg | DELAYED_RELEASE_TABLET | Freq: Every day | ORAL | Status: DC

## 2012-08-11 MED ORDER — METOPROLOL TARTRATE 25 MG PO TABS: 25.0000 mg | ORAL_TABLET | Freq: Two times a day (BID) | ORAL | Status: DC

## 2012-08-11 MED ORDER — SODIUM CHLORIDE 0.9 % IJ SOLN: 10.0000 mL | INTRAMUSCULAR | Status: DC | PRN

## 2012-08-11 MED ORDER — POLYETHYLENE GLYCOL 3350 17 G PO PACK: 17.0000 g | PACK | Freq: Every day | ORAL | Status: DC

## 2012-08-11 MED ORDER — VANCOMYCIN HCL IN DEXTROSE 1-5 GM/200ML-% IV SOLN: 1000.0000 mg | INTRAVENOUS | Status: DC

## 2012-08-11 MED ORDER — METOCLOPRAMIDE HCL 5 MG PO TABS
5.0000 mg | ORAL_TABLET | Freq: Three times a day (TID) | ORAL | Status: DC
  Administered 2012-08-11 (×2): 5 mg via ORAL
  Filled 2012-08-11 (×3): qty 1

## 2012-08-11 MED ORDER — PANTOPRAZOLE SODIUM 40 MG PO TBEC
40.0000 mg | DELAYED_RELEASE_TABLET | Freq: Every day | ORAL | Status: DC
  Administered 2012-08-11: 40 mg via ORAL
  Filled 2012-08-11: qty 1

## 2012-08-11 MED ORDER — METOCLOPRAMIDE HCL 5 MG PO TABS: 5.0000 mg | ORAL_TABLET | Freq: Three times a day (TID) | ORAL | Status: DC

## 2012-08-11 NOTE — Progress Notes (Signed)
Pt discharged to [facility], all belongings sent with pt. PICC line hep locked by IV team prior to leaving. Pt has boot in place. Rx for oxy sent with pt to SNF. Report called to Milon Score, RN prior to leaving facility. Transferred on RA. Family called and notified of transfer. (son Kasandra Knudsen).   Rayburn Ma, RN, BSN

## 2012-08-11 NOTE — Progress Notes (Signed)
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  No Known Allergies  Patient Measurements: Height: 5\' 11"  (180.3 cm) Weight: 195 lb 15.8 oz (88.9 kg) IBW/kg (Calculated) : 75.3  Vital Signs: Temp: 99.5 F (37.5 C) (06/06 0935) Temp src: Oral (06/06 0935) BP: 141/71 mmHg (06/06 0935) Pulse Rate: 89 (06/06 0935) Intake/Output from previous day: 06/05 0701 - 06/06 0700 In: 7030 [P.O.:480; I.V.:6450; IV Piggyback:100] Out: 1875 [Urine:1875] Intake/Output from this shift: Total I/O In: 91.7 [I.V.:75; Blood:16.7] Out: -   Labs:  Recent Labs  08/09/12 0735 08/10/12 0520 08/11/12 0535  WBC 16.9*  --  15.1*  HGB 9.3*  --  7.4*  PLT 517*  --  473*  CREATININE 1.22 1.31 1.34   Estimated Creatinine Clearance: 60.9 ml/min (by C-G formula based on Cr of 1.34).  Recent Labs  08/11/12 0535  VANCOTROUGH 10.9     Microbiology:   5/22 blood cultures x 2 negative; 5/30 blood cultures x 2 - neg; 6/3 blood cultures - negative to date.   5/30 - yeast in urine - Fluconazole 200 mg PO x 1 given  Assessment:  Day # 16 Vancomycin. Back on Zoysn due to nausea from Metronidazole.  POD # 8 right 2nd & 4th toes and  forefoot amputation,  POD # 12 right 3rd toe amputation.   BKA recommended, but patient refusing. Planning 6 weeks antibiotics, thru July 10th.    Initial trough level (5/25) was 16.9 mcg/ml on Vanc 1gram IV q12hrs, but subsequent trough level on 5/30 was 24.4 mcg/ml (above goal), and dose was decreased to 750 mg IV q12hrs.     Next "Trough" level 6/2 am = 33.1 mcg/ml, but dose given at 4:32am and trough drawn at 5:30am, so invalid. At goal for peak level. True trough level 6/2 pm = 24.5 mcg/ml, and dose adjusted from 750 mg IV q12hrs to 1 gram IV q24hrs at 6am.     Trough level today is 10.9 mcg/ml. Below target. Hesitant to increase back to q12h dosing due to prior high troughs.    BUN  and creatinine stable.  Goal of Therapy:  Vancomycin trough level 15-20 mcg/ml  Plan:     Increase Vancomycin to 1500 mg IV q24hrs.    Back on Zosyn 3.375 grams IV q8hrs (each over 4 hours).    Could change Zosyn to q6hrs over 30 minute infusion for home use.    Bmet at least every 3 days while on Vancomycin in the hospital.   Will need weekly vancomycin trough levels and bmet for duration of course.  Arty Baumgartner, [place] Pager: [phone] 08/11/2012,9:57 AM

## 2012-08-11 NOTE — Clinical Social Work Note (Signed)
Clinical Education officer, museum facilitated discharge by contacting facility, [employer]. Patient will be transported via ambulance. CSW will complete discharge packet and place with shadow chart. CSW will sign off, as social work intervention is no longer needed.   Leandro Reasoner MSW, [place]

## 2012-08-11 NOTE — Discharge Summary (Addendum)
Physician Discharge Summary  Patient ID: [name] MRN: [id number] DOB/AGE: 12-May-1950 62 y.o.  Admit date: 07/27/2012 Discharge date: 08/11/2012  Primary Care Physician:  No PCP Per Patient  Discharge Diagnoses:    . DKA (diabetic ketoacidoses) . Sepsis . Gangrene of foot . Tobacco abuse . Hypertension . Anemia . DM (diabetes mellitus) type 2, uncontrolled, with ketoacidosis  Consults:  Orthopedics, Dr Mayer Camel                    Infectious disease, Dr Baxter Flattery                    Psychiatry, Dr Louretta Shorten   Recommendations for Outpatient Follow-up:  1) please draw CBC, BMET, vancomycin trough weekly, fax results to Dr. Graylon Good (infectious disease) 2) Please make a followup appointment with orthopedics, Dr Mayer Camel in 1 week for follow-up 3) continue vancomycin, Zosyn, STOP DATE  09/14/12   Allergies:  No Known Allergies   Discharge Medications:   Medication List    STOP taking these medications       aspirin 81 MG chewable tablet      TAKE these medications       acetaminophen 325 MG tablet  Commonly known as:  TYLENOL  Take 2 tablets (650 mg total) by mouth every 6 (six) hours as needed.     aspirin 81 MG EC tablet  Take 1 tablet (81 mg total) by mouth daily. Swallow whole.     hydrALAZINE 50 MG tablet  Commonly known as:  APRESOLINE  Take 1 tablet (50 mg total) by mouth every 8 (eight) hours.     insulin aspart 100 UNIT/ML injection  Commonly known as:  novoLOG  Inject 0-9 Units into the skin 3 (three) times daily with meals. CBG 70 - 120: 0 units CBG 121 - 150: 1 unit,  CBG 151 - 200: 2 units,  CBG 201 - 250: 3 units,  CBG 251 - 300: 5 units,  CBG 301 - 350: 7 units,  CBG 351 - 400: 9 units   CBG > 400: 9 units and notify your MD     insulin aspart 100 UNIT/ML injection  Commonly known as:  novoLOG  Inject 4 Units into the skin 3 (three) times daily with meals.     insulin detemir 100 UNIT/ML injection  Commonly known as:  LEVEMIR  Inject 0.15 mLs (15 Units  total) into the skin at bedtime.     metoCLOPramide 5 MG tablet  Commonly known as:  REGLAN  Take 1 tablet (5 mg total) by mouth 3 (three) times daily before meals.     metoprolol tartrate 25 MG tablet  Commonly known as:  LOPRESSOR  Take 1 tablet (25 mg total) by mouth 2 (two) times daily.     ondansetron 4 MG tablet  Commonly known as:  ZOFRAN  Take 1 tablet (4 mg total) by mouth every 6 (six) hours as needed for nausea.     oxyCODONE 5 MG immediate release tablet  Commonly known as:  Oxy IR/ROXICODONE  Take 1 tablet (5 mg total) by mouth every 4 (four) hours as needed for pain.     pantoprazole 40 MG tablet  Commonly known as:  PROTONIX  Take 1 tablet (40 mg total) by mouth daily at 6 (six) AM.     piperacillin-tazobactam 3.375 GM/50ML IVPB  Commonly known as:  ZOSYN  Inject 50 mLs (3.375 g total) into the vein every 8 (eight) hours. Stop on  July10th     polyethylene glycol packet  Commonly known as:  MIRALAX / GLYCOLAX  Take 17 g by mouth daily.     sodium chloride 0.9 % injection  10-40 mLs by Intracatheter route as needed (flush).     sodium chloride 0.9 % SOLN 500 mL with vancomycin 10 G SOLR 1,500 mg  Inject 1,500 mg into the vein daily. Stop date on July 10th  Start taking on:  08/12/2012         Brief H and P: For complete details please refer to admission H and P, but in brief 62 year old male with history of diabetes mellitus ( post being on oral hypoglycemics until one year back and stopped taking his meds), CAD status post CABG X4 in 2008 ( reports being on aspirin and following with Dr. Terrence Dupont as outpatient), history of CVA, history of tobacco and alcohol use presented to the ED after a mechanical fall at home the day before the admission  and hurt his neck. Also complained of pain over his right toe that he had noticed since 3 days before the admission.    Hospital Course:  62 year old male with history of diabetes mellitus ( was on oral hypoglycemics  until one year back and stopped taking his meds), CAD status post CABG X4 in 2008 history of CVA, history of tobacco and alcohol use presented to the ED after a mechanical fall at home and hurt his neck. Also complained of pain over his right toe that he had noticed since 3 days. Patient was noted to have gangrene of his right 3rd toe with sepsis and DKA and was admitted to stepdown and susequently transferred to telemetry . He underwent metatarsal amputation on 5/28 as his foot remained gangrenous. Plan was to watch his mid foot skin heal, if there is further necrosis he should get a BKA, but he is adamantly refusing to undergo any more surgery. He continued to spike fevers despite being on antibiotics for > 10 days. ID was consulted and ortho (Dr Mayer Camel) recommends to follow outpatient in one week after discharge.   Severe sepsis : secondary to gangrenous third right toe. X ray of the foot showed osteomyelitis. MRI Right foot shows osteomyelitis of right third toe with cellulitis of foot. Patient was placed on IV vancomycin, Zosyn.  He was placed on IV vancomycin, Rocephin and Flagyl however he was not able to tolerate Flagyl. Infectious disease, Dr. Graylon Good recommended continuing vancomycin and Zosyn through July 10th, 2014. Blood cultures showed no growth .  Orthopedics was consulted and patient had underwent third Toe amputation on 5/24. However he continued to spike fever and other toe also appeared necrotic. ABI ordered and per ortho . <0.9 in both legs. He underwent right foot transmetatarsal amputation on 5/28. Ortho recommend drain for 24 hour and to see if the skin at the mid foot level survives , if there is necrosis of the skin in the next 48 hours, he will need BKA. Dr Mayer Camel recommended BKA, but patient adamantly refused any more surgery and reported that he understands the risks (including worsening of gangrene, sepsis, loss of limb, death) of not undergoing BKA. Psychiatry evaluation was done by Dr  Louretta Shorten and determined that patient has capacity to make medical decisions.   Patient has followup appointment with Dr. Graylon Good on 09/13/12 at [place]. Please arrange follow-up with Dr Mayer Camel in 1 week.  DKA (diabetic ketoacidoses) In the setting of sepsis and severe non compliance, patient was admitted to  step down unit and placed on insulin drip on admission hemoglobin A1c was noted to be 10.9. Currently blood sugars are controlled with Levemir 15 units, meal coverage, sensitive sliding scale insulin.    H/O: CVA (cerebrovascular accident) No residual weakness, on aspirin 81 mg.   Hypertension: continue hydralazine    CAD S/P CABG x 4 in 2008:  - patient follows Dr Terrence Dupont infrequently, please arrange follow-up if needed from cardiac stand point  Anemia / anemia of blood loss from the foot: Normocytic. Drop in H&H noted. No gross GI bleed. Stool for occult blood negative. Please check CBC weekly and transfuse as needed to keep Hb ~8.  Patient will have pRBC transfusion prior to DC, he has at least 5 units transfusion during this hospitalization.     Tobacco abuse/ Alcohol abuse  Reports to have quit   Hyponatremia and worsening of renal insufficiency: probably from dehydration.  patient was started on IV fluid hydration and hyponatremia improved and his creatinine appears to be stable.   Fever despite being on antibiotics: probably from the foot infection.  repeat blood cultures on 08/08/2012 has been negative. CXR does not show pneumonia And UA appears abnormal. Urine cultures are sent. And showed yeast, patient received Diflucan and Foley catheter was removed.       Day of Discharge BP 149/65  Pulse 81  Temp(Src) 100.3 F (37.9 C) (Oral)  Resp 17  Ht 5\' 11"  (1.803 m)  Wt 88.9 kg (195 lb 15.8 oz)  BMI 27.35 kg/m2  SpO2 100%  Physical Exam:  General: A x O x3, NAD  CVS: S1-S2 clear, no murmur rubs or gallops  Chest: CTAB  Abdomen: soft NT, ND, NBS  Extremities: no cyanosis,  clubbing, right foot dressing intact    The results of significant diagnostics from this hospitalization (including imaging, microbiology, ancillary and laboratory) are listed below for reference.    LAB RESULTS: Basic Metabolic Panel:  Recent Labs Lab 08/10/12 0520 08/11/12 0535  NA 135 132*  K 3.6 3.7  CL 103 99  CO2 25 25  GLUCOSE 216* 177*  BUN 12 9  CREATININE 1.31 1.34  CALCIUM 8.2* 8.3*   Liver Function Tests:  Recent Labs Lab 08/08/12 1145  AST 26  ALT 16  ALKPHOS 168*  BILITOT 2.0*  PROT 6.7  ALBUMIN 1.5*   No results found for this basename: LIPASE, AMYLASE,  in the last 168 hours  Recent Labs Lab 08/08/12 1145  AMMONIA 19   CBC:  Recent Labs Lab 08/09/12 0735  08/11/12 0535 08/11/12 1240  WBC 16.9*  --  15.1*  --   NEUTROABS 14.6*  < > 11.2*  --   HGB 9.3*  --  7.4* 8.7*  HCT 27.1*  --  22.1* 26.3*  MCV 81.9  --  83.7  --   PLT 517*  --  473*  --   < > = values in this interval not displayed. Cardiac Enzymes: No results found for this basename: CKTOTAL, CKMB, CKMBINDEX, TROPONINI,  in the last 168 hours BNP: No components found with this basename: POCBNP,  CBG:  Recent Labs Lab 08/11/12 0547 08/11/12 1102  GLUCAP 177* 71    Significant Diagnostic Studies:  Dg Chest 2 View  07/27/2012   *RADIOLOGY REPORT*  Clinical Data: Fever.  Left-sided chest pain.  Diabetes.  Coronary artery disease.  CHEST - 2 VIEW  Comparison: 07/20/2012  Findings: Low lung volumes and lordotic positioning noted.  Both lungs are clear.  No evidence of pleural effusion.  Heart size is within normal limits.  Prior CABG again noted.  IMPRESSION: No active disease.   Original Report Authenticated By: Earle Gell, M.D.   Ct Head Wo Contrast  07/27/2012   *RADIOLOGY REPORT*  Clinical Data:  Fall.  Head and neck injury and pain.  CT HEAD WITHOUT CONTRAST CT CERVICAL SPINE WITHOUT CONTRAST  Technique:  Multidetector CT imaging of the head and cervical spine was performed  following the standard protocol without intravenous contrast.  Multiplanar CT image reconstructions of the cervical spine were also generated.  Comparison:  Head CT on 04/27/2006  CT HEAD  Findings: There is no evidence of intracranial hemorrhage, brain edema or other signs of acute infarction.  There is no evidence of intracranial mass lesion or mass effect.  No abnormal extra-axial fluid collections are identified.  Ventricles remain normal in size.  Mild chronic small vessel disease is again seen.  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  No evidence of skull fracture or other bone lesion.  IMPRESSION:  1.  No acute intracranial findings. 2.  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  CT CERVICAL SPINE  Findings: No evidence of cervical spine fracture or subluxation. Degenerative disc disease is seen which is most pronounced at C6-7, and to lesser degrees at C4-5 and C5-6.  Mild facet DJD noted bilaterally as well as mild atlantoaxial degenerative changes.  No other significant bone abnormality identified.  IMPRESSION:  1.  No evidence of acute cervical spine fracture or subluxation. 2.  Degenerative spondylosis as described.   Original Report Authenticated By: Earle Gell, M.D.   Ct Cervical Spine Wo Contrast  07/27/2012   *RADIOLOGY REPORT*  Clinical Data:  Fall.  Head and neck injury and pain.  CT HEAD WITHOUT CONTRAST CT CERVICAL SPINE WITHOUT CONTRAST  Technique:  Multidetector CT imaging of the head and cervical spine was performed following the standard protocol without intravenous contrast.  Multiplanar CT image reconstructions of the cervical spine were also generated.  Comparison:  Head CT on 04/27/2006  CT HEAD  Findings: There is no evidence of intracranial hemorrhage, brain edema or other signs of acute infarction.  There is no evidence of intracranial mass lesion or mass effect.  No  abnormal extra-axial fluid collections are identified.  Ventricles remain normal in size.  Mild chronic small vessel disease is again seen.  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  No evidence of skull fracture or other bone lesion.  IMPRESSION:  1.  No acute intracranial findings. 2.  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  CT CERVICAL SPINE  Findings: No evidence of cervical spine fracture or subluxation. Degenerative disc disease is seen which is most pronounced at C6-7, and to lesser degrees at C4-5 and C5-6.  Mild facet DJD noted bilaterally as well as mild atlantoaxial degenerative changes.  No other significant bone abnormality identified.  IMPRESSION:  1.  No evidence of acute cervical spine fracture or subluxation. 2.  Degenerative spondylosis as described.   Original Report Authenticated By: Earle Gell, M.D.   Mr Foot Right Wo Contrast  07/28/2012   *RADIOLOGY REPORT*  Clinical Data: Diabetes.  Gas in third toe.  Abscess.  MRI OF THE RIGHT FOREFOOT WITHOUT CONTRAST  Technique:  Multiplanar, multisequence MR imaging was  performed. No intravenous contrast was administered.  Comparison: None.  Findings: 9.8 x 4.6 cm skin blistering noted along the dorsum of the foot.  Diffuse subcutaneous edema in the foot with diffuse edema within along the plantar musculature of the foot  Low signal intensity compatible with infiltrative gas in the subcutaneous tissues noted in the third toe, most appreciable on the T1-weighted images. Small linear gas signal intensities in the first and second intermetatarsal regions distally. Abnormal signal distal phalanx third toe compatible with osteomyelitis of the distal phalanx.  No other regions of osteomyelitis are observed in the forefoot.  Lisfranc ligament intact.  Subtle edema in the distal talus.  IMPRESSION:  1.  Osteomyelitis of the distal phalanx of the  third toe, with gas primarily tracking in the subcutaneous tissues of the third toe, compatible with gas-forming soft tissue infection in the third toe. Trace amount of suspected gas in the distal first and second intermetatarsal spaces. 2.  Diffuse soft tissue infiltrative edema in the foot, involving its subcutaneous and muscular structures.  Appearance suggests cellulitis and myositis. 3.  Large blister along the dorsum of the foot. 4.  Subtle edema in the distal talus, likely reactive or due to mild stress reaction.   Original Report Authenticated By: Van Clines, M.D.   Dg Foot Complete Right  07/27/2012   *RADIOLOGY REPORT*  Clinical Data: Gangrene right third toe block and with blistering extending into the lateral metatarsal and tarsal region, swelling, history diabetes, recent fall yesterday, question osteomyelitis  RIGHT FOOT COMPLETE - 3+ VIEW  Comparison: None  Findings: Soft tissue gas throughout the third toe. Additional foci of soft tissue gas are seen adjacent to the third metatarsal head and between the second and first metatarsal heads. Bones appear demineralized. Diffuse soft tissue swelling. Joint spaces preserved. Due to superimposed soft tissue gas is difficult to visualize the cortex at the distal phalanx there is question of bone destruction involving the shaft of the distal phalanx. No additional fracture dislocation or bone destruction. Small plantar calcaneal spur.  IMPRESSION: Extensive soft tissue gas in the third toe extending to the region of the third metatarsal head and between the first and second metatarsal head consistent with infection by a gas forming organism. Questionable bone destruction at the distal phalanx of the third toe raising question of osteomyelitis.   Original Report Authenticated By: Lavonia Dana, M.D.     Disposition and Follow-up: Discharge Orders   Future Appointments Provider Department Dept Phone   09/13/2012 9:00 AM Carlyle Basques, MD [facility] [phone]   Future Orders Complete By Expires     Diet Carb Modified  As directed     Discharge instructions  As directed     Comments:      Please check CBC, BMET, Vanc trough weekly    Increase activity slowly  As directed         DISPOSITION: SNF   DIET: Carb Modify diet  ACTIVITY: As tolerated TESTS THAT NEED FOLLOW-UP CBC, BMET, Vanc trough weekly  DISCHARGE FOLLOW-UP Follow-up Information   Follow up with Carlyle Basques, MD On 09/13/2012. (at [facility])    Contact information:   [address] [place] [address] [phone]       Follow up with Kerin Salen, MD. Schedule an appointment as soon as possible for a visit in 1 week.   Contact information:   [address] [place] [address] [phone]  Time spent on Discharge: 45 mins  Signed:   Chelsi Warr M.D. [facility] 08/11/2012, 2:30 PM Pager: [phone]

## 2012-08-12 LAB — TYPE AND SCREEN: Unit division: 0

## 2012-08-14 LAB — CULTURE, BLOOD (ROUTINE X 2): Culture: NO GROWTH

## 2012-09-04 ENCOUNTER — Other Ambulatory Visit: Payer: Self-pay | Admitting: Orthopedic Surgery

## 2012-09-05 ENCOUNTER — Encounter (HOSPITAL_COMMUNITY): Payer: Self-pay | Admitting: *Deleted

## 2012-09-05 HISTORY — PX: PERIPHERALLY INSERTED CENTRAL CATHETER INSERTION: SHX2221

## 2012-09-05 MED ORDER — CEFAZOLIN SODIUM-DEXTROSE 2-3 GM-% IV SOLR
2.0000 g | INTRAVENOUS | Status: AC
  Administered 2012-09-06: 2 g via INTRAVENOUS
  Filled 2012-09-05: qty 50

## 2012-09-05 NOTE — H&P (Signed)
[name] is an 62 y.o. male.   Chief Complaint: Right foot necrosis HPI: [name] is a patient of Dr. Mayer Camel who underwent a transmetatarsal amputation about a month ago.  He is here for his first postoperative visit.  He currently resides at [facility].  He says the people at the facility told that he would probably need some more surgery.  He is also developing some issues on his opposite leg.  Past Medical History  Diagnosis Date  . Hypertension   . Diabetes mellitus without complication     Past Surgical History  Procedure Laterality Date  . Coronary artery bypass graft  2008    4 vessel  . Amputation Right 07/29/2012    Procedure: AMPUTATION 3RD TOE;  Surgeon: Kerin Salen, MD;  Location: [facility];  Service: Orthopedics;  Laterality: Right;  right third toe amputation  . Amputation Right 08/02/2012    Procedure: right transmetatarsal amputation;  Surgeon: Kerin Salen, MD;  Location: [facility];  Service: Orthopedics;  Laterality: Right;    No family history on file. Social History:  reports that he has never smoked. He does not have any smokeless tobacco history on file. He reports that he does not drink alcohol or use illicit drugs.  Allergies: No Known Allergies  No prescriptions prior to admission    No results found for this or any previous visit (from the past 48 hour(s)). No results found.  Review of Systems  Constitutional: Negative.   HENT: Negative.   Eyes: Negative.   Respiratory: Negative.   Cardiovascular: Negative.   Gastrointestinal: Negative.   Genitourinary: Negative.   Musculoskeletal: Positive for joint pain.  Skin: Negative.   Neurological: Negative.   Endo/Heme/Allergies: Negative.   Psychiatric/Behavioral: Negative.     There were no vitals taken for this visit. Physical Exam  Constitutional: He is oriented to person, place, and time. He appears well-developed and well-nourished.  HENT:  Head: Normocephalic.  Eyes: Pupils are equal,  round, and reactive to light.  Respiratory: Effort normal.  Musculoskeletal:       Right foot: He exhibits swelling.  Neurological: He is alert and oriented to person, place, and time.     Right foot stump is necrotic.  There is foul odor and some discharge.  Calf is soft and nontender.  There is no acending lymphangitis.  In addition the patient is well developed, well nourished, and in no apparent distress.  Also alert and oriented times three.  There are normal extraocular motions and pupils are equal and reactive.  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  This would be a BKA which was something Dr. Mayer Camel thought he probably needed initially.  We are going to set this up with Dr. Mayer Camel schedule for a couple of days from now as [name] thinks he would have trouble getting in here again to see Dr. Mayer Camel in person.  He obviously has a risk of this also not healing with his concomitant diabetes and other medical issues.  Anquinette Pierro M. 09/05/2012, 4:07 PM

## 2012-09-05 NOTE — Progress Notes (Signed)
Stop Santa Lighter is 4 , pt does not have  PCP currently.

## 2012-09-06 ENCOUNTER — Inpatient Hospital Stay (HOSPITAL_COMMUNITY)
Admission: RE | Admit: 2012-09-06 | Discharge: 2012-09-11 | DRG: 475 | Disposition: A | Discharge status: Skilled Nursing Facility | Payer: Medicare Other | Source: Ambulatory Visit | Attending: Orthopedic Surgery | Admitting: Orthopedic Surgery

## 2012-09-06 ENCOUNTER — Encounter (HOSPITAL_COMMUNITY): Payer: Self-pay | Admitting: Certified Registered Nurse Anesthetist

## 2012-09-06 ENCOUNTER — Encounter (HOSPITAL_COMMUNITY)
Admission: RE | Disposition: A | Discharge status: Skilled Nursing Facility | Payer: Self-pay | Source: Ambulatory Visit | Attending: Orthopedic Surgery

## 2012-09-06 ENCOUNTER — Inpatient Hospital Stay (HOSPITAL_COMMUNITY): Payer: Medicare Other | Admitting: Certified Registered Nurse Anesthetist

## 2012-09-06 DIAGNOSIS — Z8673 Personal history of transient ischemic attack (TIA), and cerebral infarction without residual deficits: Secondary | ICD-10-CM

## 2012-09-06 DIAGNOSIS — D638 Anemia in other chronic diseases classified elsewhere: Secondary | ICD-10-CM | POA: Diagnosis present

## 2012-09-06 DIAGNOSIS — E1169 Type 2 diabetes mellitus with other specified complication: Secondary | ICD-10-CM | POA: Diagnosis present

## 2012-09-06 DIAGNOSIS — K5900 Constipation, unspecified: Secondary | ICD-10-CM | POA: Diagnosis present

## 2012-09-06 DIAGNOSIS — K219 Gastro-esophageal reflux disease without esophagitis: Secondary | ICD-10-CM | POA: Diagnosis present

## 2012-09-06 DIAGNOSIS — D62 Acute posthemorrhagic anemia: Secondary | ICD-10-CM | POA: Diagnosis present

## 2012-09-06 DIAGNOSIS — L97509 Non-pressure chronic ulcer of other part of unspecified foot with unspecified severity: Secondary | ICD-10-CM | POA: Diagnosis present

## 2012-09-06 DIAGNOSIS — L97519 Non-pressure chronic ulcer of other part of right foot with unspecified severity: Secondary | ICD-10-CM | POA: Diagnosis present

## 2012-09-06 DIAGNOSIS — D649 Anemia, unspecified: Secondary | ICD-10-CM | POA: Diagnosis present

## 2012-09-06 DIAGNOSIS — Z951 Presence of aortocoronary bypass graft: Secondary | ICD-10-CM

## 2012-09-06 DIAGNOSIS — S88119A Complete traumatic amputation at level between knee and ankle, unspecified lower leg, initial encounter: Secondary | ICD-10-CM

## 2012-09-06 DIAGNOSIS — I96 Gangrene, not elsewhere classified: Secondary | ICD-10-CM

## 2012-09-06 DIAGNOSIS — M869 Osteomyelitis, unspecified: Secondary | ICD-10-CM | POA: Diagnosis present

## 2012-09-06 DIAGNOSIS — F1010 Alcohol abuse, uncomplicated: Secondary | ICD-10-CM

## 2012-09-06 DIAGNOSIS — I771 Stricture of artery: Secondary | ICD-10-CM | POA: Diagnosis present

## 2012-09-06 DIAGNOSIS — N189 Chronic kidney disease, unspecified: Secondary | ICD-10-CM | POA: Diagnosis present

## 2012-09-06 DIAGNOSIS — IMO0001 Reserved for inherently not codable concepts without codable children: Secondary | ICD-10-CM | POA: Diagnosis present

## 2012-09-06 DIAGNOSIS — N179 Acute kidney failure, unspecified: Secondary | ICD-10-CM | POA: Diagnosis present

## 2012-09-06 DIAGNOSIS — I2510 Atherosclerotic heart disease of native coronary artery without angina pectoris: Secondary | ICD-10-CM | POA: Diagnosis present

## 2012-09-06 DIAGNOSIS — F3289 Other specified depressive episodes: Secondary | ICD-10-CM | POA: Diagnosis present

## 2012-09-06 DIAGNOSIS — I10 Essential (primary) hypertension: Secondary | ICD-10-CM | POA: Diagnosis present

## 2012-09-06 DIAGNOSIS — M9080 Osteopathy in diseases classified elsewhere, unspecified site: Secondary | ICD-10-CM | POA: Diagnosis present

## 2012-09-06 DIAGNOSIS — S98919A Complete traumatic amputation of unspecified foot, level unspecified, initial encounter: Secondary | ICD-10-CM

## 2012-09-06 DIAGNOSIS — I129 Hypertensive chronic kidney disease with stage 1 through stage 4 chronic kidney disease, or unspecified chronic kidney disease: Secondary | ICD-10-CM | POA: Diagnosis present

## 2012-09-06 DIAGNOSIS — Z87891 Personal history of nicotine dependence: Secondary | ICD-10-CM

## 2012-09-06 DIAGNOSIS — T8140XA Infection following a procedure, unspecified, initial encounter: Secondary | ICD-10-CM | POA: Diagnosis present

## 2012-09-06 DIAGNOSIS — F329 Major depressive disorder, single episode, unspecified: Secondary | ICD-10-CM | POA: Diagnosis present

## 2012-09-06 DIAGNOSIS — E1110 Type 2 diabetes mellitus with ketoacidosis without coma: Secondary | ICD-10-CM

## 2012-09-06 HISTORY — DX: Allergy status to unspecified drugs, medicaments and biological substances: Z88.9

## 2012-09-06 HISTORY — DX: Acute myocardial infarction, unspecified: I21.9

## 2012-09-06 HISTORY — PX: AMPUTATION: SHX166

## 2012-09-06 HISTORY — DX: Cerebral infarction, unspecified: I63.9

## 2012-09-06 HISTORY — DX: Gastro-esophageal reflux disease without esophagitis: K21.9

## 2012-09-06 LAB — GLUCOSE, CAPILLARY: Glucose-Capillary: 158 mg/dL — ABNORMAL HIGH (ref 70–99)

## 2012-09-06 LAB — CBC WITH DIFFERENTIAL/PLATELET
Basophils Relative: 0 % (ref 0–1)
Eosinophils Absolute: 0 10*3/uL (ref 0.0–0.7)
Eosinophils Relative: 0 % (ref 0–5)
HCT: 20 % — ABNORMAL LOW (ref 39.0–52.0)
Hemoglobin: 6.5 g/dL — CL (ref 13.0–17.0)
Lymphs Abs: 1.8 10*3/uL (ref 0.7–4.0)
MCH: 26.4 pg (ref 26.0–34.0)
MCHC: 32.5 g/dL (ref 30.0–36.0)
MCV: 81.3 fL (ref 78.0–100.0)
Monocytes Absolute: 1.5 10*3/uL — ABNORMAL HIGH (ref 0.1–1.0)
Monocytes Relative: 11 % (ref 3–12)
Neutrophils Relative %: 76 % (ref 43–77)

## 2012-09-06 LAB — BASIC METABOLIC PANEL
BUN: 40 mg/dL — ABNORMAL HIGH (ref 6–23)
CO2: 21 mEq/L (ref 19–32)
GFR calc non Af Amer: 30 mL/min — ABNORMAL LOW (ref >90)
Glucose, Bld: 178 mg/dL — ABNORMAL HIGH (ref 70–99)
Potassium: 4.7 mEq/L (ref 3.5–5.1)

## 2012-09-06 LAB — URINE MICROSCOPIC-ADD ON

## 2012-09-06 LAB — URINALYSIS, ROUTINE W REFLEX MICROSCOPIC
Bilirubin Urine: NEGATIVE
Hgb urine dipstick: NEGATIVE
Nitrite: NEGATIVE
Protein, ur: 30 mg/dL — AB
Specific Gravity, Urine: 1.011 (ref 1.005–1.030)
Urobilinogen, UA: 0.2 mg/dL (ref 0.0–1.0)

## 2012-09-06 LAB — RETICULOCYTES
RBC.: 3.11 MIL/uL — ABNORMAL LOW (ref 4.22–5.81)
Retic Count, Absolute: 62.2 10*3/uL (ref 19.0–186.0)
Retic Ct Pct: 2 % (ref 0.4–3.1)

## 2012-09-06 LAB — HEMOGLOBIN AND HEMATOCRIT, BLOOD
HCT: 24.2 % — ABNORMAL LOW (ref 39.0–52.0)
Hemoglobin: 7.9 g/dL — ABNORMAL LOW (ref 13.0–17.0)

## 2012-09-06 SURGERY — AMPUTATION BELOW KNEE
Anesthesia: General | Laterality: Right | Wound class: Clean

## 2012-09-06 MED ORDER — FENTANYL CITRATE 0.05 MG/ML IJ SOLN
25.0000 ug | INTRAMUSCULAR | Status: DC | PRN
  Administered 2012-09-06 (×4): 25 ug via INTRAVENOUS

## 2012-09-06 MED ORDER — FLEET ENEMA 7-19 GM/118ML RE ENEM: 1.0000 | ENEMA | Freq: Once | RECTAL | Status: AC | PRN

## 2012-09-06 MED ORDER — ONDANSETRON HCL 4 MG/2ML IJ SOLN: 4.0000 mg | Freq: Four times a day (QID) | INTRAMUSCULAR | Status: DC | PRN

## 2012-09-06 MED ORDER — SODIUM CHLORIDE 0.45 % IV SOLN
INTRAVENOUS | Status: DC
  Administered 2012-09-06 (×2): via INTRAVENOUS

## 2012-09-06 MED ORDER — CEFAZOLIN SODIUM 1-5 GM-% IV SOLN
INTRAVENOUS | Status: AC
  Filled 2012-09-06: qty 50

## 2012-09-06 MED ORDER — PHENYLEPHRINE HCL 10 MG/ML IJ SOLN
INTRAMUSCULAR | Status: DC | PRN
  Administered 2012-09-06 (×2): 80 ug via INTRAVENOUS

## 2012-09-06 MED ORDER — VANCOMYCIN HCL 10 G IV SOLR: 1500.0000 mg | INTRAVENOUS | Status: DC

## 2012-09-06 MED ORDER — INSULIN ASPART 100 UNIT/ML ~~LOC~~ SOLN
0.0000 [IU] | Freq: Three times a day (TID) | SUBCUTANEOUS | Status: DC
  Administered 2012-09-06: 3 [IU] via SUBCUTANEOUS
  Administered 2012-09-08: 2 [IU] via SUBCUTANEOUS
  Administered 2012-09-09: 5 [IU] via SUBCUTANEOUS
  Administered 2012-09-10 – 2012-09-11 (×3): 2 [IU] via SUBCUTANEOUS

## 2012-09-06 MED ORDER — METOPROLOL TARTRATE 25 MG PO TABS
25.0000 mg | ORAL_TABLET | Freq: Once | ORAL | Status: AC
Start: 2012-09-06 — End: 2012-09-06
  Filled 2012-09-06: qty 1

## 2012-09-06 MED ORDER — DEXTROSE-NACL 5-0.45 % IV SOLN: INTRAVENOUS | Status: DC

## 2012-09-06 MED ORDER — ONDANSETRON HCL 4 MG PO TABS
4.0000 mg | ORAL_TABLET | Freq: Four times a day (QID) | ORAL | Status: DC | PRN
  Administered 2012-09-09 – 2012-09-11 (×2): 4 mg via ORAL
  Filled 2012-09-06 (×2): qty 1

## 2012-09-06 MED ORDER — OXYCODONE HCL 5 MG PO TABS
ORAL_TABLET | ORAL | Status: AC
  Filled 2012-09-06: qty 1

## 2012-09-06 MED ORDER — OXYCODONE HCL 5 MG/5ML PO SOLN: 5.0000 mg | Freq: Once | ORAL | Status: AC | PRN

## 2012-09-06 MED ORDER — SODIUM CHLORIDE 0.9 % IV SOLN
INTRAVENOUS | Status: DC
  Administered 2012-09-06: 11:00:00 via INTRAVENOUS

## 2012-09-06 MED ORDER — METOPROLOL TARTRATE 12.5 MG HALF TABLET
ORAL_TABLET | ORAL | Status: AC
  Administered 2012-09-06: 25 mg via ORAL
  Filled 2012-09-06: qty 2

## 2012-09-06 MED ORDER — INSULIN ASPART 100 UNIT/ML ~~LOC~~ SOLN: 0.0000 [IU] | Freq: Three times a day (TID) | SUBCUTANEOUS | Status: DC

## 2012-09-06 MED ORDER — SODIUM CHLORIDE 0.9 % IV SOLN
INTRAVENOUS | Status: DC | PRN
  Administered 2012-09-06: 11:00:00 via INTRAVENOUS

## 2012-09-06 MED ORDER — INSULIN DETEMIR 100 UNIT/ML ~~LOC~~ SOLN
15.0000 [IU] | Freq: Every day | SUBCUTANEOUS | Status: DC
  Administered 2012-09-06 – 2012-09-08 (×3): 15 [IU] via SUBCUTANEOUS
  Filled 2012-09-06 (×3): qty 0.15

## 2012-09-06 MED ORDER — HYDROMORPHONE HCL PF 1 MG/ML IJ SOLN
1.0000 mg | INTRAMUSCULAR | Status: DC | PRN
  Administered 2012-09-07 – 2012-09-09 (×2): 1 mg via INTRAVENOUS
  Filled 2012-09-06 (×2): qty 1

## 2012-09-06 MED ORDER — ARTIFICIAL TEARS OP OINT
TOPICAL_OINTMENT | OPHTHALMIC | Status: DC | PRN
  Administered 2012-09-06: 1 via OPHTHALMIC

## 2012-09-06 MED ORDER — MIDAZOLAM HCL 5 MG/5ML IJ SOLN
INTRAMUSCULAR | Status: DC | PRN
  Administered 2012-09-06: 2 mg via INTRAVENOUS

## 2012-09-06 MED ORDER — PIPERACILLIN-TAZOBACTAM 3.375 G IVPB
3.3750 g | Freq: Three times a day (TID) | INTRAVENOUS | Status: DC
  Administered 2012-09-06 – 2012-09-11 (×15): 3.375 g via INTRAVENOUS
  Filled 2012-09-06 (×20): qty 50

## 2012-09-06 MED ORDER — ONDANSETRON HCL 4 MG/2ML IJ SOLN
INTRAMUSCULAR | Status: DC | PRN
  Administered 2012-09-06: 4 mg via INTRAVENOUS

## 2012-09-06 MED ORDER — MAGNESIUM HYDROXIDE 400 MG/5ML PO SUSP
30.0000 mL | Freq: Every day | ORAL | Status: DC | PRN
  Administered 2012-09-09: 30 mL via ORAL
  Filled 2012-09-06 (×2): qty 30

## 2012-09-06 MED ORDER — 0.9 % SODIUM CHLORIDE (POUR BTL) OPTIME
TOPICAL | Status: DC | PRN
  Administered 2012-09-06: 1000 mL

## 2012-09-06 MED ORDER — CEFAZOLIN SODIUM-DEXTROSE 2-3 GM-% IV SOLR
INTRAVENOUS | Status: AC
  Filled 2012-09-06: qty 50

## 2012-09-06 MED ORDER — ALTEPLASE 2 MG IJ SOLR
2.0000 mg | Freq: Once | INTRAMUSCULAR | Status: AC
  Administered 2012-09-06: 2 mg
  Filled 2012-09-06: qty 2

## 2012-09-06 MED ORDER — BUPIVACAINE HCL (PF) 0.25 % IJ SOLN: INTRAMUSCULAR | Status: DC | PRN

## 2012-09-06 MED ORDER — METOCLOPRAMIDE HCL 5 MG PO TABS
5.0000 mg | ORAL_TABLET | Freq: Three times a day (TID) | ORAL | Status: DC
  Administered 2012-09-07 – 2012-09-11 (×14): 5 mg via ORAL
  Filled 2012-09-06 (×17): qty 1

## 2012-09-06 MED ORDER — SODIUM CHLORIDE 0.9 % IJ SOLN
10.0000 mL | INTRAMUSCULAR | Status: DC | PRN
  Administered 2012-09-07 – 2012-09-11 (×2): 20 mL

## 2012-09-06 MED ORDER — PROPOFOL 10 MG/ML IV BOLUS
INTRAVENOUS | Status: DC | PRN
  Administered 2012-09-06: 150 mg via INTRAVENOUS

## 2012-09-06 MED ORDER — OXYCODONE HCL 5 MG PO TABS
5.0000 mg | ORAL_TABLET | Freq: Once | ORAL | Status: AC | PRN
  Administered 2012-09-06: 5 mg via ORAL

## 2012-09-06 MED ORDER — CEFAZOLIN SODIUM 1 G IJ SOLR: INTRAMUSCULAR | Status: DC | PRN

## 2012-09-06 MED ORDER — METOPROLOL TARTRATE 25 MG PO TABS
25.0000 mg | ORAL_TABLET | Freq: Two times a day (BID) | ORAL | Status: DC
  Administered 2012-09-06 – 2012-09-11 (×10): 25 mg via ORAL
  Filled 2012-09-06 (×11): qty 1

## 2012-09-06 MED ORDER — INSULIN ASPART 100 UNIT/ML ~~LOC~~ SOLN: 0.0000 [IU] | Freq: Every day | SUBCUTANEOUS | Status: DC

## 2012-09-06 MED ORDER — DOCUSATE SODIUM 100 MG PO CAPS
100.0000 mg | ORAL_CAPSULE | Freq: Two times a day (BID) | ORAL | Status: DC
  Administered 2012-09-06 – 2012-09-09 (×7): 100 mg via ORAL
  Filled 2012-09-06 (×10): qty 1

## 2012-09-06 MED ORDER — OXYCODONE HCL 5 MG PO TABS
5.0000 mg | ORAL_TABLET | ORAL | Status: DC | PRN
  Administered 2012-09-06 – 2012-09-11 (×18): 10 mg via ORAL
  Filled 2012-09-06 (×18): qty 2

## 2012-09-06 MED ORDER — LIDOCAINE HCL (CARDIAC) 20 MG/ML IV SOLN
INTRAVENOUS | Status: DC | PRN
  Administered 2012-09-06: 80 mg via INTRAVENOUS

## 2012-09-06 MED ORDER — BUPIVACAINE HCL (PF) 0.25 % IJ SOLN
INTRAMUSCULAR | Status: AC
  Filled 2012-09-06: qty 30

## 2012-09-06 MED ORDER — POLYETHYLENE GLYCOL 3350 17 G PO PACK
17.0000 g | PACK | Freq: Every day | ORAL | Status: DC
  Administered 2012-09-07 – 2012-09-08 (×2): 17 g via ORAL
  Filled 2012-09-06 (×5): qty 1

## 2012-09-06 MED ORDER — FENTANYL CITRATE 0.05 MG/ML IJ SOLN
INTRAMUSCULAR | Status: DC | PRN
  Administered 2012-09-06: 50 ug via INTRAVENOUS

## 2012-09-06 MED ORDER — FENTANYL CITRATE 0.05 MG/ML IJ SOLN
INTRAMUSCULAR | Status: AC
  Filled 2012-09-06: qty 2

## 2012-09-06 MED ORDER — INSULIN ASPART 100 UNIT/ML ~~LOC~~ SOLN: 4.0000 [IU] | Freq: Three times a day (TID) | SUBCUTANEOUS | Status: DC

## 2012-09-06 MED ORDER — VANCOMYCIN HCL 10 G IV SOLR
1500.0000 mg | INTRAVENOUS | Status: DC
  Administered 2012-09-07 – 2012-09-09 (×3): 1500 mg via INTRAVENOUS
  Filled 2012-09-06 (×3): qty 1500

## 2012-09-06 MED ORDER — HYDRALAZINE HCL 50 MG PO TABS
50.0000 mg | ORAL_TABLET | Freq: Three times a day (TID) | ORAL | Status: DC
  Administered 2012-09-06 – 2012-09-11 (×15): 50 mg via ORAL
  Filled 2012-09-06 (×18): qty 1

## 2012-09-06 MED ORDER — PANTOPRAZOLE SODIUM 40 MG PO TBEC
40.0000 mg | DELAYED_RELEASE_TABLET | Freq: Every day | ORAL | Status: DC
  Administered 2012-09-07 – 2012-09-09 (×2): 40 mg via ORAL
  Filled 2012-09-06 (×2): qty 1

## 2012-09-06 MED ORDER — BISACODYL 5 MG PO TBEC: 5.0000 mg | DELAYED_RELEASE_TABLET | Freq: Every day | ORAL | Status: DC | PRN

## 2012-09-06 MED ORDER — ACETAMINOPHEN 325 MG PO TABS
650.0000 mg | ORAL_TABLET | Freq: Four times a day (QID) | ORAL | Status: DC | PRN
  Administered 2012-09-06 – 2012-09-10 (×4): 650 mg via ORAL
  Filled 2012-09-06 (×4): qty 2

## 2012-09-06 SURGICAL SUPPLY — 49 items
BANDAGE ELASTIC 6 VELCRO ST LF (GAUZE/BANDAGES/DRESSINGS) IMPLANT
BANDAGE GAUZE ELAST BULKY 4 IN (GAUZE/BANDAGES/DRESSINGS) ×10 IMPLANT
BLADE SAW RECIP 87.9 MT (BLADE) ×2 IMPLANT
BLADE SURG ROTATE 9660 (MISCELLANEOUS) ×2 IMPLANT
BNDG COHESIVE 4X5 TAN STRL (GAUZE/BANDAGES/DRESSINGS) ×10 IMPLANT
CLOTH BEACON ORANGE TIMEOUT ST (SAFETY) ×2 IMPLANT
COVER SURGICAL LIGHT HANDLE (MISCELLANEOUS) ×2 IMPLANT
CUFF TOURNIQUET SINGLE 34IN LL (TOURNIQUET CUFF) IMPLANT
CUFF TOURNIQUET SINGLE 44IN (TOURNIQUET CUFF) ×2 IMPLANT
DRAPE EXTREMITY T 121X128X90 (DRAPE) ×2 IMPLANT
DRAPE SURG 17X11 SM STRL (DRAPES) ×2 IMPLANT
DRAPE U-SHAPE 47X51 STRL (DRAPES) ×2 IMPLANT
DURAPREP 26ML APPLICATOR (WOUND CARE) ×2 IMPLANT
EVACUATOR 1/8 PVC DRAIN (DRAIN) ×2 IMPLANT
GAUZE XEROFORM 5X9 LF (GAUZE/BANDAGES/DRESSINGS) IMPLANT
GLOVE BIO SURGEON STRL SZ7 (GLOVE) ×4 IMPLANT
GLOVE BIO SURGEON STRL SZ7.5 (GLOVE) ×2 IMPLANT
GLOVE BIOGEL PI IND STRL 7.0 (GLOVE) ×2 IMPLANT
GLOVE BIOGEL PI IND STRL 8 (GLOVE) ×1 IMPLANT
GLOVE BIOGEL PI INDICATOR 7.0 (GLOVE) ×2
GLOVE BIOGEL PI INDICATOR 8 (GLOVE) ×1
GOWN PREVENTION PLUS XLARGE (GOWN DISPOSABLE) ×2 IMPLANT
GOWN STRL NON-REIN LRG LVL3 (GOWN DISPOSABLE) ×4 IMPLANT
KIT BASIN OR (CUSTOM PROCEDURE TRAY) ×2 IMPLANT
KIT ROOM TURNOVER OR (KITS) ×2 IMPLANT
MANIFOLD NEPTUNE II (INSTRUMENTS) ×2 IMPLANT
NEEDLE 22X1 1/2 (OR ONLY) (NEEDLE) ×2 IMPLANT
NEEDLE HYPO 25GX1X1/2 BEV (NEEDLE) ×2 IMPLANT
NS IRRIG 1000ML POUR BTL (IV SOLUTION) ×2 IMPLANT
PACK GENERAL/GYN (CUSTOM PROCEDURE TRAY) ×2 IMPLANT
PAD ARMBOARD 7.5X6 YLW CONV (MISCELLANEOUS) ×4 IMPLANT
PAD CAST 4YDX4 CTTN HI CHSV (CAST SUPPLIES) IMPLANT
PADDING CAST COTTON 4X4 STRL (CAST SUPPLIES)
SOLUTION BETADINE 4OZ (MISCELLANEOUS) IMPLANT
SPONGE GAUZE 4X4 12PLY (GAUZE/BANDAGES/DRESSINGS) ×2 IMPLANT
SPONGE SCRUB IODOPHOR (GAUZE/BANDAGES/DRESSINGS) IMPLANT
STAPLER VISISTAT 35W (STAPLE) ×2 IMPLANT
STOCKINETTE IMPERVIOUS LG (DRAPES) ×2 IMPLANT
SUT ETHILON 3 0 PS 1 (SUTURE) ×4 IMPLANT
SUT SILK 1 TIES 10X30 (SUTURE) ×2 IMPLANT
SUT SILK 2 0 TIES 10X30 (SUTURE) ×2 IMPLANT
SUT VIC AB 0 CTB1 27 (SUTURE) ×2 IMPLANT
SUT VIC AB 1 CTB1 27 (SUTURE) ×4 IMPLANT
SUT VIC AB 2-0 CTB1 (SUTURE) ×4 IMPLANT
SYR CONTROL 10ML LL (SYRINGE) ×2 IMPLANT
TOWEL OR 17X24 6PK STRL BLUE (TOWEL DISPOSABLE) ×4 IMPLANT
TOWEL OR 17X26 10 PK STRL BLUE (TOWEL DISPOSABLE) ×2 IMPLANT
UNDERPAD 30X30 INCONTINENT (UNDERPADS AND DIAPERS) ×2 IMPLANT
WATER STERILE IRR 1000ML POUR (IV SOLUTION) ×2 IMPLANT

## 2012-09-06 NOTE — Op Note (Signed)
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 He was noted to have necrosis of the right third toe on admission and after stabilization for 2 days he was taken for removal of the right third toe and unfortunately there was no bleeding noted from the edge of the wound. He had partial necrosis of the forefoot over the next couple of days underwent a transmetatarsal amputation, but again there was little bleeding noted although his ABIs were noted to be 0.65 and 0.67 to after a few more days we recommended on the amputation, which was declined by the patient. He was discharged to a nursing home and unfortunately did not heal his wound the necrosis has progressed to the midfoot and heel, is very malodorous, and he is now ready for below knee amputation. The risks and benefits of surgery been discussed. The contralateral limb also has some evidence of dysvascular area of the small vessels with some epidermal blistering over the heel and distal tib-fib which are also becoming atrophic risk-benefit since surgery been discussed with the patient.  Procedure: The patient was identified by arm band receive preoperative IV antibiotics and taken to the operating room, and hospital. The appropriate site monitors were attached and general endotracheal anesthesia was induced. Because of his chronic diseases noted to have a hemoglobin of 6.5 when he came in to preadmission today and he did receive 2 units of packed red blood cells intraoperatively for this. A tourniquet was applied high to the right thigh and a plastic bag and  tape was placed over the right foot at the junction of the third and fourth quarters of the tib-fib. The right lower [place] was then prepped and draped in usual sterile fashion from the tourniquet to the distal tib-fib and a timeout procedure performed. The leg was elevated for 3 minutes and the tourniquet inflated to 350 mm of mercury. Starting one hand breath below the tibial tubercle a standard fishmouth incision was made through the skin and subcutaneous tissue into the muscle of the distal tib-fib with a longer posterior flap. This exposed the periosteum over the tibia which was peeled back with a periosteal elevator for an inch and a half and then the tibial cut was accomplished with the oscillating saw and an anterior notch placed. The bone was smoothed with the oscillating saw. 1 inch proximal to this a standard cut was then made on the tibia after cutting through the muscle of the anterior compartment. We identified and tied off the posterior tib and peroneal vessels using double ligatures. The posterior tibial and peroneal nerves are also cut at this time. We then continued cutting through the muscle of the posterior compartment back to the posterior skin flap. The wound was then thoroughly irrigated normal saline solution the tourniquet let down small bleeders identified and cauterized. The posterior fascia was then repaired back to the anterior fascia using #1 Vicryl suture interrupted. We then repaired the subcutaneous tissue from posterior to anterior using 2-0 Vicryl suture. A medium Hemovac drain was placed in the wound below the fascia. The skin was then closed with  staples spaced about a quarter inch apart to allow some drainage. A dressing of Xerofoam 4 x 4 dressing sponges Curlex and Coban was then applied. The patient was awakened extubated and taken to the recovery room without difficulty.

## 2012-09-06 NOTE — Interval H&P Note (Signed)
History and Physical Interval Note:  09/06/2012 11:29 AM  [name]  has presented today for surgery, with the diagnosis of RIGHT LEG NECROSIS  The various methods of treatment have been discussed with the patient and family. After consideration of risks, benefits and other options for treatment, the patient has consented to  Procedure(s): AMPUTATION BELOW KNEE (Right) as a surgical intervention .  The patient's history has been reviewed, patient examined, no change in status, stable for surgery.  I have reviewed the patient's chart and labs.  Questions were answered to the patient's satisfaction.     Kerin Salen

## 2012-09-06 NOTE — Progress Notes (Signed)
Family with questions regarding infection in remaining leg.  Please address rounding.

## 2012-09-06 NOTE — Anesthesia Postprocedure Evaluation (Signed)
Anesthesia Post Note  Patient: [name]  Procedure(s) Performed: Procedure(s) (LRB): AMPUTATION BELOW KNEE (Right)  Anesthesia type: General  Patient location: PACU  Post pain: Pain level controlled and Adequate analgesia  Post assessment: Post-op Vital signs reviewed, Patient's Cardiovascular Status Stable, Respiratory Function Stable, Patent Airway and Pain level controlled  Last Vitals:  Filed Vitals:   09/06/12 1316  BP: 124/57  Pulse: 82  Temp: 37.2 C  Resp: 16    Post vital signs: Reviewed and stable  Level of consciousness: awake, alert  and oriented  Complications: No apparent anesthesia complications

## 2012-09-06 NOTE — Consult Note (Signed)
Medical Consultation   [name]  [id number]  DOB: 10/11/1950  DOA: 09/06/2012  PCP: No PCP Per Patient  Requesting physician: Dr Mayer Camel  Reason for consultation: Medical management   History of Present Illness: 62 year old male with history of diabetes mellitus , CAD status post CABG X4 in 2008 (cardiologist: Dr. Terrence Dupont as outpatient), history of CVA, history of tobacco and alcohol use, was admitted in 5/22 with sepsis, DKA, gangrene and OM rt third toe, underwent toe amputation on 5/24, resides at [facility] care facility. Patient underwent right below knee amputation today for nonhealing right foot ulcer/osteomyelitis.  [facility] hospitalist service consulted for medical co-management.   Allergies:  No Known Allergies    Past Medical History  Diagnosis Date  . Hypertension   . Diabetes mellitus without complication   . 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 Myocardial infarction   . Depression   . Shortness of breath     with activy  . Hx of seasonal allergies   . GERD (gastroesophageal reflux disease)   . Cataract     Past Surgical History  Procedure Laterality Date  . Coronary artery bypass graft  2008    4 vessel  . Amputation Right 07/29/2012    Procedure: AMPUTATION 3RD TOE;  Surgeon: Kerin Salen, MD;  Location: [place];  Service: Orthopedics;  Laterality: Right;  right third toe amputation  . Amputation Right 08/02/2012    Procedure: right transmetatarsal amputation;  Surgeon: Kerin Salen, MD;  Location: [place];  Service: Orthopedics;  Laterality: Right;  . Eye surgery      catarct    Social History:  reports that he has never smoked. He does not have any smokeless tobacco history on file. He reports that he does not drink alcohol or use illicit drugs.  he is currently resident of [facility] History reviewed. No pertinent family history.  Review of Systems:   Constitutional: Denies fever, chills, diaphoresis, appetite change  and fatigue.  HEENT: Denies photophobia, eye pain, redness, hearing loss, ear pain, congestion, sore throat, rhinorrhea, sneezing, mouth sores, trouble swallowing, neck pain, neck stiffness and tinnitus.   Respiratory: Denies SOB, DOE, cough, chest tightness,  and wheezing.   Cardiovascular: Denies chest pain, palpitations and leg swelling.  Gastrointestinal: Denies nausea, vomiting, abdominal pain, diarrhea, constipation, blood in stool and abdominal distention.  Genitourinary: Denies dysuria, urgency, frequency, hematuria, flank pain and difficulty urinating.  Musculoskeletal: Denies myalgias, back pain, joint swelling, arthralgias and gait problem.  Skin: Denies pallor, rash and wound.  Neurological: Denies dizziness, seizures, syncope, weakness, light-headedness, numbness and headaches.  Hematological: Denies adenopathy. Easy bruising, personal or family bleeding history  Psychiatric/Behavioral: Denies suicidal ideation, mood changes, confusion, nervousness, sleep disturbance and agitation   Physical Exam: Blood pressure 124/57, pulse 82, temperature 98.9 F (37.2 C), temperature source Oral, resp. rate 16, SpO2 95.00%.  General: Alert and awake, oriented x3, not in any acute distress. HEENT: normocephalic, atraumatic, anicteric sclera, pupils reactive to light and accommodation, EOMI, oropharynx clear CVS: S1-S2 clear, no murmur rubs or gallops Chest: clear to auscultation bilaterally, no wheezing, rales or rhonchi Abdomen: soft nontender, nondistended, normal bowel sounds, no organomegaly Extremities: Right BKA, dressing intact on the left lower extremity Neuro: Cranial nerves II-XII intact, no focal neurological deficits Psych: alert and oriented, stable mood and affect Skin: no rashes or lesions  Labs on Admission:  Basic Metabolic Panel:  Recent Labs Lab 09/06/12 0951  NA 134*  K 4.7  CL 101  CO2 21  GLUCOSE 178*  BUN 40*  CREATININE 2.20*  CALCIUM 8.5   CBC:  Recent Labs Lab 09/06/12 0951  WBC 14.1*  NEUTROABS 10.7*  HGB 6.5*  HCT 20.0*  MCV 81.3  PLT 416*   Cardiac Enzymes: No results found for this basename: CKTOTAL, CKMB, CKMBINDEX, TROPONINI,  in the last 168 hours BNP: No components found with this basename: POCBNP,  CBG:  Recent Labs Lab 09/06/12 0859 09/06/12 1054 09/06/12 1318  GLUCAP 158* 158* 158*    Inpatient Medications:   Scheduled Meds: . ceFAZolin      . fentaNYL      . oxyCODONE       Continuous Infusions: . sodium chloride 20 mL/hr at 09/06/12 1100  . dextrose 5 % and 0.45% NaCl       Radiological Exams on Admission: No results found.  Impression/Recommendations Active Problems:   Type II or unspecified type diabetes mellitus  - Will restart Levemir and sliding scale insulin (moderate)    H/O: CVA (cerebrovascular accident):  - hold ASA until FOBT results/ anemia    Hypertension: Continue metoprolol, hydralazine    Anemia: - per RN in PACU, patient received 2 units packed RBCs prior to the surgery, recheck hemoglobin - Obtain anemia panel, stool occult test, hold aspirin until GI bleed ruled out. It could be intermittent bleeding from the foot ulcer.    Acute kidney injury - Likely worsened due to anemia, patient has been on vancomycin.  - Monitor creatinine function    Foot ulcer, right, osteomyelitis s/p right BKA  - Management per orthopedics  [facility] hospitalist service will follow the patient, thank you for this consultation.  Time Spent on Admission: 45 mins  RAI,RIPUDEEP M.D. [facility] 09/06/2012, 1:36 PM

## 2012-09-06 NOTE — Progress Notes (Signed)
Spoke with Dr Hal Morales re: hgb of 6.5.  Orders received for 2 units packed cell to be infused, but not to delay surgery because of it.  Dr Marcie Bal notified and holding area nurse contacted.  Blood bank currently working on preparing them.

## 2012-09-06 NOTE — Progress Notes (Signed)
Patient received from PACU.  Vitals stable.  Patient oriented to room and unit.  Dressings clean, dry, and intact to right BKA and left leg.  Will continue to monitor.

## 2012-09-06 NOTE — Progress Notes (Signed)
ANTIBIOTIC CONSULT NOTE - INITIAL  Pharmacy Consult for Vancomycin Indication:  Right foot necrosis/osteomyelitis:   Now s/p R. BKA 09/06/12  No Known Allergies  Patient Measurements: Height: 5' 10.87" (180 cm) (from 07/27/12 data) Weight: 195 lb 15.8 oz (88.9 kg) (from 07/31/12 at 04:11 AM) IBW/kg (Calculated) : 74.99   Vital Signs: Temp: 98.8 F (37.1 C) (07/02 1545) Temp src: Oral (07/02 1545) BP: 139/81 mmHg (07/02 1545) Pulse Rate: 93 (07/02 1545) Intake/Output from previous day:   Intake/Output from this shift:    Labs:  Recent Labs  09/06/12 0951 09/06/12 1500  WBC 14.1*  --   HGB 6.5* 7.9*  PLT 416*  --   CREATININE 2.20*  --    Estimated Creatinine Clearance: 36.9 ml/min (by C-G formula based on Cr of 2.2).  Recent Labs  09/06/12 1701  VANCORANDOM 17.9     Microbiology: Recent Results (from the past 720 hour(s))  CULTURE, BLOOD (ROUTINE X 2)     Status: None   Collection Time    08/08/12  4:22 PM      Result Value Range Status   Specimen Description BLOOD LEFT UPPER WRIST   Final   Special Requests BOTTLES DRAWN AEROBIC AND ANAEROBIC 10CC   Final   Culture  Setup Time 08/08/2012 20:36   Final   Culture NO GROWTH 5 DAYS   Final   Report Status 08/14/2012 FINAL   Final  CULTURE, BLOOD (ROUTINE X 2)     Status: None   Collection Time    08/08/12  4:39 PM      Result Value Range Status   Specimen Description BLOOD LEFT ARM   Final   Special Requests BOTTLES DRAWN AEROBIC AND ANAEROBIC 10CC   Final   Culture  Setup Time 08/08/2012 20:37   Final   Culture NO GROWTH 5 DAYS   Final   Report Status 08/14/2012 FINAL   Final    Medical History: Past Medical History  Diagnosis Date  . Hypertension   . Diabetes mellitus without complication   . 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 Myocardial infarction   . Depression   . Shortness of breath     with activy  . Hx of seasonal allergies   . GERD (gastroesophageal reflux disease)   . Cataract      Medications:  Prescriptions prior to admission  Medication Sig Dispense Refill  . acetaminophen (TYLENOL) 325 MG tablet Take 2 tablets (650 mg total) by mouth every 6 (six) hours as needed.      Marland Kitchen aspirin 81 MG EC tablet Take 1 tablet (81 mg total) by mouth daily. Swallow whole.  30 tablet  12  . hydrALAZINE (APRESOLINE) 50 MG tablet Take 1 tablet (50 mg total) by mouth every 8 (eight) hours.      . insulin aspart (NOVOLOG) 100 UNIT/ML injection Inject 0-9 Units into the skin 3 (three) times daily with meals. CBG 70 - 120: 0 units CBG 121 - 150: 1 unit,  CBG 151 - 200: 2 units,  CBG 201 - 250: 3 units,  CBG 251 - 300: 5 units,  CBG 301 - 350: 7 units,  CBG 351 - 400: 9 units   CBG > 400: 9 units and notify your MD  1 vial  12  . insulin aspart (NOVOLOG) 100 UNIT/ML injection Inject 4 Units into the skin 3 (three) times daily with meals.  1 vial  12  . insulin detemir (LEVEMIR) 100 UNIT/ML  injection Inject 0.15 mLs (15 Units total) into the skin at bedtime.  10 mL  12  . metoCLOPramide (REGLAN) 5 MG tablet Take 1 tablet (5 mg total) by mouth 3 (three) times daily before meals.      . metoprolol tartrate (LOPRESSOR) 25 MG tablet Take 1 tablet (25 mg total) by mouth 2 (two) times daily.      . ondansetron (ZOFRAN) 4 MG tablet Take 1 tablet (4 mg total) by mouth every 6 (six) hours as needed for nausea.  20 tablet  0  . oxyCODONE (OXY IR/ROXICODONE) 5 MG immediate release tablet Take 1 tablet (5 mg total) by mouth every 4 (four) hours as needed for pain.  30 tablet  0  . pantoprazole (PROTONIX) 40 MG tablet Take 1 tablet (40 mg total) by mouth daily at 6 (six) AM.      . piperacillin-tazobactam (ZOSYN) 3.375 GM/50ML IVPB Inject 50 mLs (3.375 g total) into the vein every 8 (eight) hours. Stop on July10th  50 mL    . polyethylene glycol (MIRALAX / GLYCOLAX) packet Take 17 g by mouth daily.  14 each  0  . sodium chloride 0.9 % injection 10-40 mLs by Intracatheter route as needed (flush).  5 mL    .  sodium chloride 0.9 % SOLN 500 mL with vancomycin 10 G SOLR 1,500 mg Inject 1,500 mg into the vein daily. Stop date on July 10th       Assessment: 62 y.o male with righ foot necrosis s/p transmetatarsal amputation 08/02/12 secondary to diabetic ketoacidosis with deep infection.  MRI on 07/28/12 revealed osteomyelitis of the distal phalanx of the third toe. He was started on IV vancomycin which was continued upon his discharge to [facility] care facility. PTA he has been receiving Vancomycin 1.5 gm IV q24h. I called [facility] and RN reviewed his MAG/verified the patient received his IV vancomycin dose at 6am today prior to coming to [facility].    Today he underwent right BKA for nonhealing right foot necrosis/osteomyelitis.   SCr = 2.2;  CrCl ~ 37 ml/min  (last admission SCr ranged 1.13 - 1.34)   Random vancomycin level today @  17:01 = 17.9 mcg/ml ~ 10 hrs after 6am  dose infused.  Previous admission, vancomycin trough was >20 mcg/ml on 750 mg IV q12h (when Scr = 1.4) thus dose was adjusted to 1.5 gm IV q24h.  He received 2 gm IV Ancef in OR today @ 11:49AM.   PMH: DM, CAD s/p CABG x4 2008, CVA, h/o tobacco and alcohol use.  Goal of Therapy:  Vancomycin trough = 15-20 mcg/ml  Plan:  Continue Vancomycin 1.5 gm IV q24h at 6AM tomorrow.  Check steady state trough on 09/09/12.   Nicole Cella, RPh Clinical Pharmacist Pager: [phone] 09/06/2012,7:40 PM

## 2012-09-06 NOTE — Transfer of Care (Signed)
Immediate Anesthesia Transfer of Care Note  Patient: [name]  Procedure(s) Performed: Procedure(s): AMPUTATION BELOW KNEE (Right)  Patient Location: PACU  Anesthesia Type:General  Level of Consciousness: awake, alert , oriented and patient cooperative  Airway & Oxygen Therapy: Patient Spontanous Breathing  Post-op Assessment: Report given to PACU RN, Post -op Vital signs reviewed and stable and Patient moving all extremities X 4  Post vital signs: Reviewed and stable  Complications: No apparent anesthesia complications

## 2012-09-06 NOTE — Anesthesia Preprocedure Evaluation (Addendum)
Anesthesia Evaluation  Patient identified by MRN, date of birth, ID band Patient awake    Reviewed: Allergy & Precautions, H&P , NPO status , Patient's Chart, lab work & pertinent test results  Airway Mallampati: II  Neck ROM: full    Dental   Pulmonary shortness of breath, Current Smoker,          Cardiovascular hypertension, + CAD, + Past MI and + CABG     Neuro/Psych Depression CVA    GI/Hepatic GERD-  ,(+)     substance abuse  alcohol use,   Endo/Other  diabetes, Type 2DKA a month ago  Renal/GU ARFRenal disease     Musculoskeletal   Abdominal   Peds  Hematology  (+) Blood dyscrasia, anemia ,   Anesthesia Other Findings   Reproductive/Obstetrics                          Anesthesia Physical Anesthesia Plan  ASA: III  Anesthesia Plan: General   Post-op Pain Management:    Induction: Intravenous  Airway Management Planned: LMA  Additional Equipment:   Intra-op Plan:   Post-operative Plan:   Informed Consent: I have reviewed the patients History and Physical, chart, labs and discussed the procedure including the risks, benefits and alternatives for the proposed anesthesia with the patient or authorized representative who has indicated his/her understanding and acceptance.     Plan Discussed with: CRNA, Anesthesiologist and Surgeon  Anesthesia Plan Comments:         Anesthesia Quick Evaluation

## 2012-09-07 ENCOUNTER — Inpatient Hospital Stay (HOSPITAL_COMMUNITY): Payer: Medicare Other

## 2012-09-07 ENCOUNTER — Encounter (HOSPITAL_COMMUNITY): Payer: Self-pay | Admitting: Orthopedic Surgery

## 2012-09-07 DIAGNOSIS — F1010 Alcohol abuse, uncomplicated: Secondary | ICD-10-CM

## 2012-09-07 DIAGNOSIS — E1110 Type 2 diabetes mellitus with ketoacidosis without coma: Secondary | ICD-10-CM

## 2012-09-07 LAB — HEMOGLOBIN A1C: Hgb A1c MFr Bld: 5.9 % — ABNORMAL HIGH (ref <5.7)

## 2012-09-07 LAB — CBC
HCT: 23.1 % — ABNORMAL LOW (ref 39.0–52.0)
MCHC: 33.3 g/dL (ref 30.0–36.0)
MCV: 81.3 fL (ref 78.0–100.0)
RDW: 15.4 % (ref 11.5–15.5)

## 2012-09-07 LAB — GLUCOSE, CAPILLARY: Glucose-Capillary: 107 mg/dL — ABNORMAL HIGH (ref 70–99)

## 2012-09-07 LAB — CREATININE, URINE, RANDOM: Creatinine, Urine: 50.75 mg/dL

## 2012-09-07 LAB — URINALYSIS W MICROSCOPIC + REFLEX CULTURE
Glucose, UA: NEGATIVE mg/dL
Hgb urine dipstick: NEGATIVE
Ketones, ur: NEGATIVE mg/dL
Protein, ur: 30 mg/dL — AB

## 2012-09-07 LAB — BASIC METABOLIC PANEL
BUN: 36 mg/dL — ABNORMAL HIGH (ref 6–23)
BUN: 35 mg/dL — ABNORMAL HIGH (ref 6–23)
CO2: 21 mEq/L (ref 19–32)
CO2: 22 mEq/L (ref 19–32)
Calcium: 8 mg/dL — ABNORMAL LOW (ref 8.4–10.5)
Chloride: 106 mEq/L (ref 96–112)
Creatinine, Ser: 2.08 mg/dL — ABNORMAL HIGH (ref 0.50–1.35)
Creatinine, Ser: 2.08 mg/dL — ABNORMAL HIGH (ref 0.50–1.35)
Glucose, Bld: 125 mg/dL — ABNORMAL HIGH (ref 70–99)

## 2012-09-07 LAB — IRON AND TIBC: UIBC: 54 ug/dL — ABNORMAL LOW (ref 125–400)

## 2012-09-07 LAB — SODIUM, URINE, RANDOM: Sodium, Ur: 28 mEq/L

## 2012-09-07 LAB — OSMOLALITY, URINE: Osmolality, Ur: 260 mOsm/kg — ABNORMAL LOW (ref 390–1090)

## 2012-09-07 LAB — FERRITIN: Ferritin: 5292 ng/mL — ABNORMAL HIGH (ref 22–322)

## 2012-09-07 MED ORDER — HEPARIN SODIUM (PORCINE) 5000 UNIT/ML IJ SOLN
5000.0000 [IU] | Freq: Three times a day (TID) | INTRAMUSCULAR | Status: DC
  Administered 2012-09-07 – 2012-09-11 (×12): 5000 [IU] via SUBCUTANEOUS
  Filled 2012-09-07 (×15): qty 1

## 2012-09-07 MED ORDER — SODIUM CHLORIDE 0.9 % IV SOLN
INTRAVENOUS | Status: AC
  Administered 2012-09-07: 08:00:00 via INTRAVENOUS

## 2012-09-07 NOTE — Progress Notes (Signed)
[facility]                                                                                Patient Demographics  [name], is a 62 y.o. male, DOB - February 15, 1951, [id number], [id number]  Admit date - 09/06/2012  Admitting Physician Thomas Salen, MD  Outpatient Primary MD for the patient is No PCP Per Patient  LOS - 1   No chief complaint on file.       Assessment & Plan     Type II or unspecified type diabetes mellitus  - Will restart Levemir and sliding scale insulin (moderate)   Lab Results  Component Value Date   HGBA1C 5.9* 09/06/2012    CBG (last 3)   Recent Labs  09/06/12 1623 09/06/12 2133 09/07/12 0725  GLUCAP 188* 173* 107*      H/O: CVA (cerebrovascular accident), CAD CABG Continue aspirin along with beta blocker. He is chest pain-free, try to keep hemoglobin above 8.     Hypertension: Continue metoprolol, hydralazine     Anemia: Likely anemia of chronic disease worsened by perioperative blood loss. - per RN in PACU, patient received 2 units packed RBCs prior to the surgery, still remains anemic, with his history of CAD, now acute renal failure I will transfuse him another unit of packed RBC on 09/07/2012. Anemia panel is stable, pending occult blood result.    Acute kidney injury   - Likely worsened due to anemia, patient has been on vancomycin. Vancomycin dose being managed by pharmacy. - Renal function has improved some, we'll transfuse another unit on 09/07/2012 as he continues to have end organ damage and check BMP tomorrow.      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 Mayer Price orthopedic surgeon on 09/07/2012, he is doubtful that patient will be aable to salvage his left leg. as that also looks infected, for now empiric antibiotics to be continued.    Code Status: Full  Family Communication: None present  Disposition Plan:  Per orthopedics   Procedures R. BKA done by primary team   DVT Prophylaxis   Heparin   Lab Results  Component Value Date   PLT 383 09/07/2012    Medications  Scheduled Meds: . docusate sodium  100 mg Oral BID  . hydrALAZINE  50 mg Oral Q8H  . insulin aspart  0-15 Units Subcutaneous TID WC  . insulin aspart  0-5 Units Subcutaneous QHS  . insulin detemir  15 Units Subcutaneous QHS  . metoCLOPramide  5 mg Oral TID AC  . metoprolol tartrate  25 mg Oral BID  . pantoprazole  40 mg Oral Q0600  . piperacillin-tazobactam  3.375 g Intravenous Q8H  . polyethylene glycol  17 g Oral Daily  . vancomycin  1,500 mg Intravenous Q24H   Continuous Infusions: . sodium chloride 75 mL/hr at 09/07/12 0828   PRN Meds:.acetaminophen, bisacodyl, HYDROmorphone (DILAUDID) injection, magnesium hydroxide, ondansetron, oxyCODONE, sodium chloride  Antibiotics    Anti-infectives   Start     Dose/Rate Route Frequency Ordered Stop   09/07/12 0600  vancomycin (VANCOCIN)  1,500 mg in sodium chloride 0.9 % 500 mL IVPB     1,500 mg 250 mL/hr over 120 Minutes Intravenous Every 24 hours 09/06/12 2023     09/06/12 1645  piperacillin-tazobactam (ZOSYN) IVPB 3.375 g    Comments:  For necrotic right lower extremity.   3.375 g 12.5 mL/hr over 240 Minutes Intravenous 3 times per day 09/06/12 1613     09/06/12 1615  vancomycin (VANCOCIN) 1,500 mg in sodium chloride 0.9 % 500 mL IVPB  Status:  Discontinued    Comments:  For necrotic right lower extremity.   1,500 mg 250 mL/hr over 120 Minutes Intravenous Every 24 hours 09/06/12 1613 09/06/12 1625   09/06/12 1124  ceFAZolin (ANCEF) injection  Status:  Discontinued       As needed 09/06/12 1125 09/06/12 1313   09/06/12 1103  ceFAZolin (ANCEF) 2-3 GM-% IVPB SOLR    Comments:  [name]: cabinet override      09/06/12 1103 09/06/12 2314   09/06/12 0600  ceFAZolin (ANCEF) IVPB 2 g/50 mL premix     2 g 100 mL/hr over 30 Minutes Intravenous On call to O.R.  09/05/12 1210 09/06/12 1149       Time Spent in minutes   40   [name] on 09/07/2012 at 9:58 AM  Between 7am to 7pm - Pager - [phone]  After 7pm go to [email or web address] - password [email or web address]  And look for the night coverage person covering for me after hours  [facility] Office  [phone]    Subjective:   [name] today has, No headache, No chest pain, No abdominal pain - No Nausea, No new weakness tingling or numbness, No Cough - SOB.   Objective:   Filed Vitals:   09/06/12 1545 09/06/12 2117 09/07/12 0400 09/07/12 0728  BP: 139/81 138/61  131/64  Pulse: 93 100  96  Temp: 98.8 F (37.1 C) 102.7 F (39.3 C) 98.4 F (36.9 C) 100.1 F (37.8 C)  TempSrc: Oral Oral Oral Oral  Resp: 16 16  18   Height: 5' 10.87" (1.8 m)     Weight: 88.9 kg (195 lb 15.8 oz)     SpO2: 99% 90%  97%    Wt Readings from Last 3 Encounters:  09/06/12 88.9 kg (195 lb 15.8 oz)  09/06/12 88.9 kg (195 lb 15.8 oz)  07/31/12 88.9 kg (195 lb 15.8 oz)     Intake/Output Summary (Last 24 hours) at 09/07/12 0958 Last data filed at 09/07/12 0222  Gross per 24 hour  Intake 2136.67 ml  Output    200 ml  Net 1936.67 ml    Exam Awake Alert, Oriented X 3, No new F.N deficits, Normal affect [place].AT,PERRAL Supple Neck,No JVD, No cervical lymphadenopathy appriciated.  Symmetrical Chest wall movement, Good air movement bilaterally, CTAB RRR,No Gallops,Rubs or new Murmurs, No Parasternal Heave +ve B.Sounds, Abd Soft, Non tender, No organomegaly appriciated, No rebound - guarding or rigidity. R. BKA, left leg and foot also in bandage upto his calf.   Data Review   Micro Results No results found for this or any previous visit (from the past 240 hour(s)).  Radiology Reports Dg Abd 1 View  08/09/2012   *RADIOLOGY REPORT*  Clinical Data: Abdominal pain and vomiting.  ABDOMEN - 1 VIEW  Comparison: None.  Findings: Bowel gas pattern is unremarkable.  No abnormal abdominal  calcification is seen.  There is some enthesopathic change about the pelvis.  IMPRESSION: No acute abnormality.   Original Report  Authenticated By: [name], Price.   Dg Chest Port 1 View  09/07/2012   *RADIOLOGY REPORT*  Clinical Data: Fever  PORTABLE CHEST - 1 VIEW  Comparison: 08/09/2012  Findings: There is been previous median sternotomy and CABG.  Right arm PICC has been withdrawn somewhat, the tip now in the SVC just above the azygos level.  There is worsened pulmonary density, right more than left, probably representing interstitial edema.  One could not rule out developing widespread pneumonia.  No effusions. Bony structures are unremarkable.  IMPRESSION: Some withdrawal of the right arm PICC with the tip now in the SVC just above the azygos level.  Worsened pulmonary density, more notable on the right than the left.  This is most consistent with developing pulmonary edema, though pneumonia is possible as well.   Original Report Authenticated By: Nelson Chimes, Price.   Dg Chest Port 1 View  08/09/2012   *RADIOLOGY REPORT*  Clinical Data: PICC placement.  PORTABLE CHEST - 1 VIEW  Comparison: PA and lateral chest 08/04/2012.  Findings: Right PICC is in place with the tip projecting over the lower superior vena cava.  Lungs are clear.  Heart size is upper normal.  No pneumothorax or pleural fluid.  IMPRESSION: Tip of right PICC projects over the lower superior vena cava.  No acute abnormality.   Original Report Authenticated By: [name], Price.    [facility] 09/06/12 [phone] 09/06/12 1500 09/07/12 0600  WBC 14.1*  --  14.2*  HGB 6.5* 7.9* 7.7*  HCT 20.0* 24.2* 23.1*  PLT 416*  --  383  MCV 81.3  --  81.3  MCH 26.4  --  27.1  MCHC 32.5  --  33.3  RDW 15.4  --  15.4  LYMPHSABS 1.8  --   --   MONOABS 1.5*  --   --   EOSABS 0.0  --   --   BASOSABS 0.0  --   --     Chemistries   Recent Labs Lab 09/06/12 0951 09/07/12 0600  NA 134* 137  K 4.7 4.5  CL 101 106  CO2  21 21  GLUCOSE 178* 125*  BUN 40* 36*  CREATININE 2.20* 2.08*  CALCIUM 8.5 8.0*   ------------------------------------------------------------------------------------------------------------------ estimated creatinine clearance is 39.1 ml/min (by C-G formula based on Cr of 2.08). ------------------------------------------------------------------------------------------------------------------  Recent Labs  09/06/12 1701  HGBA1C 5.9*   ------------------------------------------------------------------------------------------------------------------ No results found for this basename: CHOL, HDL, LDLCALC, TRIG, CHOLHDL, LDLDIRECT,  in the last 72 hours ------------------------------------------------------------------------------------------------------------------ No results found for this basename: TSH, T4TOTAL, FREET3, T3FREE, THYROIDAB,  in the last 72 hours ------------------------------------------------------------------------------------------------------------------  Recent Labs  09/06/12 1701  VITAMINB12 693  FOLATE 8.7  FERRITIN 5292*  TIBC 121*  IRON 67  RETICCTPCT 2.0    Coagulation profile  Recent Labs Lab 09/06/12 0951  INR 1.32    No results found for this basename: DDIMER,  in the last 72 hours  Cardiac Enzymes No results found for this basename: CK, CKMB, TROPONINI, MYOGLOBIN,  in the last 168 hours ------------------------------------------------------------------------------------------------------------------ No components found with this basename: POCBNP,

## 2012-09-07 NOTE — Progress Notes (Signed)
Orthopedic Tech Progress Note Patient Details:  [name] [birth year] [id number]  Ortho Devices Type of Ortho Device: Haematologist Ortho Device/Splint Interventions: Application   [name] 09/07/2012, 12:23 PM

## 2012-09-07 NOTE — Consult Note (Signed)
WOC consult Note Reason for Consult:Management of left LE with ulcerations (chronic). Patient is unable to tell me how long he has had LE ulcerations.  Nods affirmatively when I say, "Long time?". Wound type:Mixed etiology (arterial and venous insufficiency),  LE wounds on left LE managed with weekly Unna's Boots. Pressure Ulcer POA: No Measurement: left [place] Comment is with numerous small open areas on the anterior aspect, the largest of which measures 1.5cm x 1.5cm x .2cm.  The posterior LE has a full thickness ulcer measuring 6.5cm x 5cm x 0.2cm with islands of epithelial tissue throughout the light yellow wound bed.  No drainage, no odor. Wound bed:As described above. Drainage (amount, consistency, odor): As described above. Periwound: Intact on the posterior aspect; scattered open areas anteriorally as mentioned above. Dressing procedure/placement/frequency: We will protect the posterior LE wounds with a soft silicone foam dressing and continued the previously used Unna's Boots with weekly changes. Ortho tech has been ordered for placement and weekly changes while here. [facility] Nursing Team will not follow.  Please re-consult if needed. Thanks, Maudie Flakes, MSN, RN, [facility], [place] [phone])

## 2012-09-07 NOTE — Clinical Social Work Psychosocial (Signed)
Clinical Social Work Department  BRIEF PSYCHOSOCIAL ASSESSMENT  Patient: [name]  Account Number: [id number]  Admit date: 09/06/12 Clinical Social Worker Rhea Pink, MSW Date/Time: 09/07/2012 3:00 PM Referred by: Physician Date Referred:  Referred for   SNF Placement   Other Referral:  Interview type: Patient  Other interview type: PSYCHOSOCIAL DATA  Living Status: ALone Admitted from facility: [facility] Level of care: SNF Primary support name: Lemont Fillers Primary support relationship to patient: Sister Degree of support available:  Poor  CURRENT CONCERNS  Current Concerns   Post-Acute Placement   Other Concerns:  SOCIAL WORK ASSESSMENT / PLAN  CSW met with pt re: PT recommendation for SNF.   Pt lives alone but was admitted from [facility]. Patient does not want to return there and would like to go to [facility] [place]  CSW explained placement process and answered questions.   Pt reports  [facility] [place]  as his preference    CSW completed FL2 and sent clinicals to  [facility] [place]    Assessment/plan status: Information/Referral to [employer]  Other assessment/ plan:  Information/referral to community resources:  SNF     PATIENT'S/FAMILY'S RESPONSE TO PLAN OF CARE:  Pt  reports he does not want to return to [facility] but  is agreeable to [facility] [place] in order to increase strength and independence with mobility prior to returning home  Pt verbalized understanding of placement process and appreciation for CSW assist.   Rhea Pink, MSW [phone]

## 2012-09-07 NOTE — Progress Notes (Signed)
Subjective: 1 Day Post-Op Procedure(s) (LRB): AMPUTATION BELOW KNEE (Right) Patient reports pain as 2 on 0-10 scale and 3 on 0-10 scale, pain is better than it was preop. He reports improved appetite. He reports no change in the left lower trembly which is had some epidermal blistering probably secondary to small vessel disease which lead to the right lower extremity BKA. Patient has been seen by tried hospitalist and is being monitored for moderate acute renal failure as well as his anemia of chronic disease. He did receive 2 units of packed red cells at surgery yesterday   Objective: Vital signs in last 24 hours: Temp:  [98.4 F (36.9 C)-102.7 F (39.3 C)] 98.4 F (36.9 C) (07/03 0400) Pulse Rate:  [81-102] 100 (07/02 2117) Resp:  [15-20] 16 (07/02 2117) BP: (119-139)/(55-81) 138/61 mmHg (07/02 2117) SpO2:  [90 %-99 %] 90 % (07/02 2117) Weight:  [88.9 kg (195 lb 15.8 oz)] 88.9 kg (195 lb 15.8 oz) (07/02 1545)  Intake/Output from previous day: 07/02 0701 - 07/03 0700 In: 2136.7 [I.V.:1336.7; Blood:700; IV Piggyback:100] Out: 200 [Blood:200] Intake/Output this shift:     Recent Labs  09/06/12 0951 09/06/12 1500 09/07/12 0600  HGB 6.5* 7.9* 7.7*    Recent Labs  09/06/12 0951 09/06/12 1500 09/07/12 0600  WBC 14.1*  --  14.2*  RBC 2.46*  --  2.84*  HCT 20.0* 24.2* 23.1*  PLT 416*  --  383    Recent Labs  09/06/12 0951  NA 134*  K 4.7  CL 101  CO2 21  BUN 40*  CREATININE 2.20*  GLUCOSE 178*  CALCIUM 8.5    Recent Labs  09/06/12 0951  INR 1.32    ABD soft Sensation intact distally Dorsiflexion/Plantar flexion intact Compartment soft The BKA dressing is dry, there is little if any drainage and the drainage tube and it was removed today. The contralateral left foot continues to have a mottled appearance although he is able to dorsiflex and plantarflex without pain and says that he can feel me touch his foot. When he was admitted yesterday he did have some  epidermal blistering along the leg consistent with small vascular disease. Assessment/Plan: 1 Day Post-Op Procedure(s) (LRB): AMPUTATION BELOW KNEE (Right) The patient's left lower Judithann Graves is being evaluated by the wound care nurse He is feeling better with his hemoglobin having gone from 6.5-7.9, but he does have a history of four-vessel bypass and may benefit from further transfusion. I will await the recommendation of tried hospitalist.  Discharge to SNF, when medically stable and after recommendations for the left lower trembly skin care.  Decorian Schuenemann J 09/07/2012, 7:18 AM

## 2012-09-08 ENCOUNTER — Inpatient Hospital Stay (HOSPITAL_COMMUNITY): Payer: Medicare Other

## 2012-09-08 LAB — BASIC METABOLIC PANEL
BUN: 32 mg/dL — ABNORMAL HIGH (ref 6–23)
CO2: 22 mEq/L (ref 19–32)
Chloride: 107 mEq/L (ref 96–112)
Creatinine, Ser: 2.14 mg/dL — ABNORMAL HIGH (ref 0.50–1.35)
GFR calc Af Amer: 36 mL/min — ABNORMAL LOW (ref >90)

## 2012-09-08 LAB — URINE CULTURE: Colony Count: 9000

## 2012-09-08 LAB — CBC
HCT: 24.4 % — ABNORMAL LOW (ref 39.0–52.0)
MCH: 27.4 pg (ref 26.0–34.0)
MCV: 82.4 fL (ref 78.0–100.0)
RDW: 15.8 % — ABNORMAL HIGH (ref 11.5–15.5)
WBC: 11.7 10*3/uL — ABNORMAL HIGH (ref 4.0–10.5)

## 2012-09-08 LAB — GLUCOSE, CAPILLARY: Glucose-Capillary: 111 mg/dL — ABNORMAL HIGH (ref 70–99)

## 2012-09-08 MED ORDER — OXYCODONE HCL 5 MG PO TABS: 5.0000 mg | ORAL_TABLET | ORAL | Status: DC | PRN

## 2012-09-08 NOTE — Progress Notes (Signed)
[facility]                                                                                Patient Demographics  [name], is a 62 y.o. male, DOB - 11/19/1950, [id number], [id number]  Admit date - 09/06/2012  Admitting Physician Kerin Salen, MD  Outpatient Primary MD for the patient is No PCP Per Patient  LOS - 2   No chief complaint on file.       Assessment & Plan     Type II or unspecified type diabetes mellitus  - Will restart Levemir and sliding scale insulin (moderate)   Lab Results  Component Value Date   HGBA1C 5.9* 09/06/2012    CBG (last 3)   Recent Labs  09/07/12 1611 09/07/12 2125 09/08/12 0632  GLUCAP 90 194* 79      H/O: CVA (cerebrovascular accident), CAD CABG Continue aspirin along with beta blocker. He is chest pain-free, try to keep hemoglobin above 8.     Hypertension: Continue metoprolol, hydralazine     Anemia: Likely anemia of chronic disease worsened by perioperative blood loss and dilution with IV fluids.  - He status post 3 units of packed RBC transfusion, his anemia panel is stable, he is Hemoccult negative x2. Monitor H&H on intermittent basis.     Acute kidney injury   - Likely worsened due to anemia, patient has been on vancomycin. Vancomycin dose being managed by pharmacy.  - Renal function has improved some, he status post 3 units packed RBC transfusion and IV fluids which are ongoing, however still not close to baseline, will check a renal ultrasound also, continue IV fluids, repeat BMP in the morning, if renal function is still not better I will call renal consult.      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 Mayer Camel orthopedic surgeon on 09/07/2012, he is doubtful that patient will be aable to salvage his left leg. as that also looks infected, for now empiric antibiotics to be continued.    Code  Status: Full  Family Communication: None present  Disposition Plan: Per orthopedics   Procedures R. BKA done by primary team, renal ultrasound ordered   DVT Prophylaxis   Heparin   Lab Results  Component Value Date   PLT 312 09/08/2012    Medications  Scheduled Meds: . docusate sodium  100 mg Oral BID  . heparin subcutaneous  5,000 Units Subcutaneous Q8H  . hydrALAZINE  50 mg Oral Q8H  . insulin aspart  0-15 Units Subcutaneous TID WC  . insulin aspart  0-5 Units Subcutaneous QHS  . insulin detemir  15 Units Subcutaneous QHS  . metoCLOPramide  5 mg Oral TID AC  . metoprolol tartrate  25 mg Oral BID  . pantoprazole  40 mg Oral Q0600  . piperacillin-tazobactam  3.375 g Intravenous Q8H  . polyethylene glycol  17 g Oral Daily  . vancomycin  1,500 mg Intravenous Q24H   Continuous Infusions: . sodium chloride 75 mL/hr at 09/07/12 0828   PRN Meds:.acetaminophen, bisacodyl, HYDROmorphone (DILAUDID) injection, magnesium hydroxide, ondansetron, oxyCODONE,  sodium chloride  Antibiotics    Anti-infectives   Start     Dose/Rate Route Frequency Ordered Stop   09/07/12 0600  vancomycin (VANCOCIN) 1,500 mg in sodium chloride 0.9 % 500 mL IVPB     1,500 mg 250 mL/hr over 120 Minutes Intravenous Every 24 hours 09/06/12 2023     09/06/12 1645  piperacillin-tazobactam (ZOSYN) IVPB 3.375 g    Comments:  For necrotic right lower extremity.   3.375 g 12.5 mL/hr over 240 Minutes Intravenous 3 times per day 09/06/12 1613     09/06/12 1615  vancomycin (VANCOCIN) 1,500 mg in sodium chloride 0.9 % 500 mL IVPB  Status:  Discontinued    Comments:  For necrotic right lower extremity.   1,500 mg 250 mL/hr over 120 Minutes Intravenous Every 24 hours 09/06/12 1613 09/06/12 1625   09/06/12 1124  ceFAZolin (ANCEF) injection  Status:  Discontinued       As needed 09/06/12 1125 09/06/12 1313   09/06/12 1103  ceFAZolin (ANCEF) 2-3 GM-% IVPB SOLR    Comments:  Ara Kussmaul: cabinet override       09/06/12 1103 09/06/12 2314   09/06/12 0600  ceFAZolin (ANCEF) IVPB 2 g/50 mL premix     2 g 100 mL/hr over 30 Minutes Intravenous On call to O.R. 09/05/12 1210 09/06/12 1149       Time Spent in minutes   40   SINGH,PRASHANT K M.D on 09/08/2012 at 8:04 AM  Between 7am to 7pm - Pager - [phone]  After 7pm go to [email or web address] - password [email or web address]  And look for the night coverage person covering for me after hours  [facility] Office  [phone]    Subjective:   [name] today has, No headache, No chest pain, No abdominal pain - No Nausea, No new weakness tingling or numbness, No Cough - SOB.   Objective:   Filed Vitals:   09/07/12 1330 09/07/12 2115 09/07/12 2300 09/08/12 0538  BP: 126/57 127/59  123/58  Pulse: 93 91  84  Temp: 100 F (37.8 C) 100.9 F (38.3 C) 100 F (37.8 C) 100.4 F (38 C)  TempSrc: Oral     Resp: 18 18  16   Height:      Weight:      SpO2: 98% 98%  98%    Wt Readings from Last 3 Encounters:  09/06/12 88.9 kg (195 lb 15.8 oz)  09/06/12 88.9 kg (195 lb 15.8 oz)  07/31/12 88.9 kg (195 lb 15.8 oz)     Intake/Output Summary (Last 24 hours) at 09/08/12 0804 Last data filed at 09/08/12 [id number]  Gross per 24 hour  Intake 1702.5 ml  Output   1825 ml  Net -122.5 ml    Exam Awake Alert, Oriented X 3, No new F.N deficits, Normal affect [place].AT,PERRAL Supple Neck,No JVD, No cervical lymphadenopathy appriciated.  Symmetrical Chest wall movement, Good air movement bilaterally, CTAB RRR,No Gallops,Rubs or new Murmurs, No Parasternal Heave +ve B.Sounds, Abd Soft, Non tender, No organomegaly appriciated, No rebound - guarding or rigidity. R. BKA, left leg and foot also in bandage upto his calf.   Data Review   Micro Results No results found for this or any previous visit (from the past 240 hour(s)).  Radiology Reports Dg Abd 1 View  08/09/2012   *RADIOLOGY REPORT*  Clinical Data: Abdominal pain and vomiting.  ABDOMEN - 1 VIEW   Comparison: None.  Findings: Bowel gas pattern is unremarkable.  No abnormal abdominal calcification is seen.  There is some enthesopathic change about the pelvis.  IMPRESSION: No acute abnormality.   Original Report Authenticated By: Orlean Patten, M.D.   Dg Chest Port 1 View  09/07/2012   *RADIOLOGY REPORT*  Clinical Data: Fever  PORTABLE CHEST - 1 VIEW  Comparison: 08/09/2012  Findings: There is been previous median sternotomy and CABG.  Right arm PICC has been withdrawn somewhat, the tip now in the SVC just above the azygos level.  There is worsened pulmonary density, right more than left, probably representing interstitial edema.  One could not rule out developing widespread pneumonia.  No effusions. Bony structures are unremarkable.  IMPRESSION: Some withdrawal of the right arm PICC with the tip now in the SVC just above the azygos level.  Worsened pulmonary density, more notable on the right than the left.  This is most consistent with developing pulmonary edema, though pneumonia is possible as well.   Original Report Authenticated By: Nelson Chimes, M.D.   Dg Chest Port 1 View  08/09/2012   *RADIOLOGY REPORT*  Clinical Data: PICC placement.  PORTABLE CHEST - 1 VIEW  Comparison: PA and lateral chest 08/04/2012.  Findings: Right PICC is in place with the tip projecting over the lower superior vena cava.  Lungs are clear.  Heart size is upper normal.  No pneumothorax or pleural fluid.  IMPRESSION: Tip of right PICC projects over the lower superior vena cava.  No acute abnormality.   Original Report Authenticated By: Orlean Patten, M.D.    [facility] 09/06/12 [phone] 09/06/12 1500 09/07/12 0600 09/07/12 1601 09/08/12 0500  WBC 14.1*  --  14.2*  --  11.7*  HGB 6.5* 7.9* 7.7* 8.6* 8.1*  HCT 20.0* 24.2* 23.1* 25.4* 24.4*  PLT 416*  --  383  --  312  MCV 81.3  --  81.3  --  82.4  MCH 26.4  --  27.1  --  27.4  MCHC 32.5  --  33.3  --  33.2  RDW 15.4  --  15.4  --  15.8*  LYMPHSABS  1.8  --   --   --   --   MONOABS 1.5*  --   --   --   --   EOSABS 0.0  --   --   --   --   BASOSABS 0.0  --   --   --   --     Chemistries   Recent Labs Lab 09/06/12 0951 09/07/12 0600 09/07/12 1045 09/08/12 0500  NA 134* 137 136 137  K 4.7 4.5 4.6 4.5  CL 101 106 105 107  CO2 21 21 22 22   GLUCOSE 178* 125* 117* 85  BUN 40* 36* 35* 32*  CREATININE 2.20* 2.08* 2.08* 2.14*  CALCIUM 8.5 8.0* 8.0* 7.7*   ------------------------------------------------------------------------------------------------------------------ estimated creatinine clearance is 38 ml/min (by C-G formula based on Cr of 2.14). ------------------------------------------------------------------------------------------------------------------  Recent Labs  09/06/12 1701  HGBA1C 5.9*   ------------------------------------------------------------------------------------------------------------------ No results found for this basename: CHOL, HDL, LDLCALC, TRIG, CHOLHDL, LDLDIRECT,  in the last 72 hours ------------------------------------------------------------------------------------------------------------------ No results found for this basename: TSH, T4TOTAL, FREET3, T3FREE, THYROIDAB,  in the last 72 hours ------------------------------------------------------------------------------------------------------------------  Recent Labs  09/06/12 1701  VITAMINB12 693  FOLATE 8.7  FERRITIN 5292*  TIBC 121*  IRON 67  RETICCTPCT 2.0    Coagulation profile  Recent Labs Lab 09/06/12 0951  INR 1.32    No results found for this basename: DDIMER,  in the last 72 hours  Cardiac Enzymes No results found for this basename: CK, CKMB, TROPONINI, MYOGLOBIN,  in the last 168 hours ------------------------------------------------------------------------------------------------------------------ No components found with this basename: POCBNP,

## 2012-09-08 NOTE — Progress Notes (Signed)
PATIENT ID: [name]   2 Days Post-Op Procedure(s) (LRB): AMPUTATION BELOW KNEE (Right)  Subjective: Patient is feeling okay this am. Reports pain is well controlled with current pain mgmt. No other complaints or concerns.   Objective:  Filed Vitals:   09/08/12 0538  BP: 123/58  Pulse: 84  Temp: 100.4 F (38 C)  Resp: 16     Awake, alert, oritentated R LE with mild swelling, dressing in place Surrounding skin with no swelling, erythema, warmth  Labs:   Recent Labs  09/06/12 0951 09/06/12 1500 09/07/12 0600 09/07/12 1601 09/08/12 0500  HGB 6.5* 7.9* 7.7* 8.6* 8.1*   Recent Labs  09/07/12 0600 09/07/12 1601 09/08/12 0500  WBC 14.2*  --  11.7*  RBC 2.84*  --  2.96*  HCT 23.1* 25.4* 24.4*  PLT 383  --  312   Recent Labs  09/07/12 1045 09/08/12 0500  NA 136 137  K 4.6 4.5  CL 105 107  CO2 22 22  BUN 35* 32*  CREATININE 2.08* 2.14*  GLUCOSE 117* 85  CALCIUM 8.0* 7.7*    Assessment and Plan: 2 days s/p BKA Pain well controlled, continue current pain mgmt ABLA- expected postoperatively, mgmt per primary team D/C to SNF when medically stable per primary team  VTE proph: per primary team

## 2012-09-09 ENCOUNTER — Inpatient Hospital Stay (HOSPITAL_COMMUNITY): Payer: Medicare Other

## 2012-09-09 LAB — BASIC METABOLIC PANEL
BUN: 30 mg/dL — ABNORMAL HIGH (ref 6–23)
Chloride: 107 mEq/L (ref 96–112)
Glucose, Bld: 72 mg/dL (ref 70–99)
Potassium: 4.6 mEq/L (ref 3.5–5.1)

## 2012-09-09 LAB — GLUCOSE, CAPILLARY
Glucose-Capillary: 116 mg/dL — ABNORMAL HIGH (ref 70–99)
Glucose-Capillary: 108 mg/dL — ABNORMAL HIGH (ref 70–99)
Glucose-Capillary: 202 mg/dL — ABNORMAL HIGH (ref 70–99)

## 2012-09-09 LAB — PROTEIN / CREATININE RATIO, URINE
Creatinine, Urine: 45.31 mg/dL
Protein Creatinine Ratio: 0.93 — ABNORMAL HIGH (ref 0.00–0.15)
Total Protein, Urine: 42.2 mg/dL

## 2012-09-09 LAB — CBC
HCT: 23.9 % — ABNORMAL LOW (ref 39.0–52.0)
Hemoglobin: 7.9 g/dL — ABNORMAL LOW (ref 13.0–17.0)
WBC: 10.8 10*3/uL — ABNORMAL HIGH (ref 4.0–10.5)

## 2012-09-09 MED ORDER — FUROSEMIDE 10 MG/ML IJ SOLN
60.0000 mg | Freq: Three times a day (TID) | INTRAMUSCULAR | Status: DC
  Administered 2012-09-09 – 2012-09-11 (×5): 60 mg via INTRAVENOUS
  Filled 2012-09-09 (×3): qty 6
  Filled 2012-09-09: qty 4
  Filled 2012-09-09: qty 6
  Filled 2012-09-09: qty 2
  Filled 2012-09-09 (×4): qty 6

## 2012-09-09 MED ORDER — POLYETHYLENE GLYCOL 3350 17 G PO PACK
17.0000 g | PACK | Freq: Two times a day (BID) | ORAL | Status: DC
  Administered 2012-09-09: 17 g via ORAL
  Filled 2012-09-09 (×6): qty 1

## 2012-09-09 MED ORDER — PANTOPRAZOLE SODIUM 40 MG PO TBEC
40.0000 mg | DELAYED_RELEASE_TABLET | Freq: Two times a day (BID) | ORAL | Status: DC
  Administered 2012-09-09 – 2012-09-11 (×5): 40 mg via ORAL
  Filled 2012-09-09 (×5): qty 1

## 2012-09-09 MED ORDER — BISACODYL 10 MG RE SUPP
10.0000 mg | Freq: Every day | RECTAL | Status: DC
  Administered 2012-09-09: 10 mg via RECTAL
  Filled 2012-09-09: qty 1

## 2012-09-09 MED ORDER — INSULIN DETEMIR 100 UNIT/ML ~~LOC~~ SOLN
11.0000 [IU] | Freq: Every day | SUBCUTANEOUS | Status: DC
  Administered 2012-09-09: 11 [IU] via SUBCUTANEOUS
  Filled 2012-09-09: qty 0.11

## 2012-09-09 MED ORDER — SODIUM CHLORIDE 0.9 % IV SOLN
350.0000 mg | INTRAVENOUS | Status: DC
  Administered 2012-09-09 – 2012-09-10 (×2): 350 mg via INTRAVENOUS
  Filled 2012-09-09 (×3): qty 7

## 2012-09-09 NOTE — Consult Note (Signed)
[facility] - CONSULT NOTE Resident Note    Please see below for attending addendum to resident note.   Date: 09/09/2012                  Patient Name:  [name]  MRN: [id number]  DOB: May 09, 1950  Age / Sex: 62 y.o., male         PCP: No PCP Per Patient                 Referring Physician: Margaree Mackintosh. Candiss Norse, MD                 Reason for Consult: Acute renal failure            History of Present Illness: Patient is a 62 y.o. male with a PMHx of CAD, s/p 4 vessel CABG '08 and CVA, chronic venous insufficiency, DM2, and HTN, who was admitted to [facility] on 09/06/2012 for evaluation of right foot osteomyelitis and LLE ulcerations, now POD#3 s/p R BKA, adn is on IV abx for his LLE wounds.  On admission, he was started on IV Vancomycin for his osteomyelitis. His RLE was unable to be salvaged, and a R BKA was performed. Abx were continued 2/2 LLE ulcerations from his chronic venous and arterial insufficiency.   He was also found to be anemic on admission and has received a total of 3u pRBCs. Unfortunately, despite blood and IVF, his kidney function has continued to worsen although he remains non-oliguric. Baseline Cr around 1.2-1.4 with a GFR 60-70.   Medications: Outpatient medications: Prescriptions prior to admission  Medication Sig Dispense Refill  . acetaminophen (TYLENOL) 325 MG tablet Take 2 tablets (650 mg total) by mouth every 6 (six) hours as needed.      Marland Kitchen aspirin 81 MG EC tablet Take 1 tablet (81 mg total) by mouth daily. Swallow whole.  30 tablet  12  . hydrALAZINE (APRESOLINE) 50 MG tablet Take 1 tablet (50 mg total) by mouth every 8 (eight) hours.      . insulin aspart (NOVOLOG) 100 UNIT/ML injection Inject 0-9 Units into the skin 3 (three) times daily with meals. CBG 70 - 120: 0 units CBG 121 - 150: 1 unit,  CBG 151 - 200: 2 units,  CBG 201 - 250: 3 units,  CBG 251 - 300: 5 units,  CBG 301 - 350: 7 units,  CBG 351 - 400: 9 units   CBG > 400: 9 units and notify  your MD  1 vial  12  . insulin aspart (NOVOLOG) 100 UNIT/ML injection Inject 4 Units into the skin 3 (three) times daily with meals.  1 vial  12  . insulin detemir (LEVEMIR) 100 UNIT/ML injection Inject 0.15 mLs (15 Units total) into the skin at bedtime.  10 mL  12  . metoCLOPramide (REGLAN) 5 MG tablet Take 1 tablet (5 mg total) by mouth 3 (three) times daily before meals.      . metoprolol tartrate (LOPRESSOR) 25 MG tablet Take 1 tablet (25 mg total) by mouth 2 (two) times daily.      . ondansetron (ZOFRAN) 4 MG tablet Take 1 tablet (4 mg total) by mouth every 6 (six) hours as needed for nausea.  20 tablet  0  . pantoprazole (PROTONIX) 40 MG tablet Take 1 tablet (40 mg total) by mouth daily at 6 (six) AM.      . piperacillin-tazobactam (ZOSYN) 3.375 GM/50ML IVPB Inject 50 mLs (3.375 g total) into the  vein every 8 (eight) hours. Stop on July10th  50 mL    . polyethylene glycol (MIRALAX / GLYCOLAX) packet Take 17 g by mouth daily.  14 each  0  . sodium chloride 0.9 % injection 10-40 mLs by Intracatheter route as needed (flush).  5 mL    . sodium chloride 0.9 % SOLN 500 mL with vancomycin 10 G SOLR 1,500 mg Inject 1,500 mg into the vein daily. Stop date on July 10th      . [DISCONTINUED] oxyCODONE (OXY IR/ROXICODONE) 5 MG immediate release tablet Take 1 tablet (5 mg total) by mouth every 4 (four) hours as needed for pain.  30 tablet  0    Current medications: Current Facility-Administered Medications  Medication Dose Route Frequency Provider Last Rate Last Dose  . acetaminophen (TYLENOL) tablet 650 mg  650 mg Oral Q6H PRN Clearnce Sorrel. Williams, PA-C   650 mg at 09/09/12 0243  . bisacodyl (DULCOLAX) EC tablet 5 mg  5 mg Oral Daily PRN Clearnce Sorrel. Williams, PA-C      . bisacodyl (DULCOLAX) suppository 10 mg  10 mg Rectal Daily Thurnell Lose, MD   10 mg at 09/09/12 0823  . docusate sodium (COLACE) capsule 100 mg  100 mg Oral BID Clearnce Sorrel. Williams, PA-C   100 mg at 09/09/12 1022  . heparin  injection 5,000 Units  5,000 Units Subcutaneous Q8H Thurnell Lose, MD   5,000 Units at 09/09/12 [id number]  . hydrALAZINE (APRESOLINE) tablet 50 mg  50 mg Oral Q8H Clearnce Sorrel. Williams, PA-C   50 mg at 09/09/12 0631  . HYDROmorphone (DILAUDID) injection 1 mg  1 mg Intravenous Q2H PRN Clearnce Sorrel. Williams, PA-C   1 mg at 09/07/12 2110  . insulin aspart (novoLOG) injection 0-15 Units  0-15 Units Subcutaneous TID WC Ripudeep Krystal Eaton, MD   2 Units at 09/08/12 1724  . insulin detemir (LEVEMIR) injection 11 Units  11 Units Subcutaneous QHS Thurnell Lose, MD      . magnesium hydroxide (MILK OF MAGNESIA) suspension 30 mL  30 mL Oral Daily PRN Clearnce Sorrel. Williams, PA-C   30 mL at 09/09/12 0801  . metoCLOPramide (REGLAN) tablet 5 mg  5 mg Oral TID AC Clearnce Sorrel. Williams, PA-C   5 mg at 09/09/12 [id number]  . metoprolol tartrate (LOPRESSOR) tablet 25 mg  25 mg Oral BID Clearnce Sorrel. Williams, PA-C   25 mg at 09/09/12 1022  . ondansetron (ZOFRAN) tablet 4 mg  4 mg Oral Q6H PRN Clearnce Sorrel. Williams, PA-C   4 mg at 09/09/12 0800  . oxyCODONE (Oxy IR/ROXICODONE) immediate release tablet 5-10 mg  5-10 mg Oral Q4H PRN Clearnce Sorrel. Williams, PA-C   10 mg at 09/09/12 [id number]  . pantoprazole (PROTONIX) EC tablet 40 mg  40 mg Oral BID Thurnell Lose, MD   40 mg at 09/09/12 0900  . piperacillin-tazobactam (ZOSYN) IVPB 3.375 g  3.375 g Intravenous Q8H Clearnce Sorrel. Williams, PA-C   3.375 g at 09/09/12 0631  . polyethylene glycol (MIRALAX / GLYCOLAX) packet 17 g  17 g Oral BID Thurnell Lose, MD   17 g at 09/09/12 [id number]  . sodium chloride 0.9 % injection 10-40 mL  10-40 mL Intracatheter PRN Clearnce Sorrel. Williams, PA-C   20 mL at 09/07/12 1045  . vancomycin (VANCOCIN) 1,500 mg in sodium chloride 0.9 % 500 mL IVPB  1,500 mg Intravenous Q24H Arman Bogus, RPH   1,500 mg at 09/09/12 [phone]  Allergies: No Known Allergies    Past Medical History: Past Medical History  Diagnosis Date  . Hypertension   . Diabetes mellitus  without complication   . 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 Myocardial infarction   . Depression   . Shortness of breath     with activy  . Hx of seasonal allergies   . GERD (gastroesophageal reflux disease)   . Cataract      Past Surgical History: Past Surgical History  Procedure Laterality Date  . Coronary artery bypass graft  2008    4 vessel  . Amputation Right 07/29/2012    Procedure: AMPUTATION 3RD TOE;  Surgeon: Kerin Salen, MD;  Location: [place];  Service: Orthopedics;  Laterality: Right;  right third toe amputation  . Amputation Right 08/02/2012    Procedure: right transmetatarsal amputation;  Surgeon: Kerin Salen, MD;  Location: [place];  Service: Orthopedics;  Laterality: Right;  . Eye surgery      catarct  . Amputation Right 09/06/2012    Procedure: AMPUTATION BELOW KNEE;  Surgeon: Kerin Salen, MD;  Location: [place];  Service: Orthopedics;  Laterality: Right;     Family History: History reviewed. No pertinent family history.   Social History:  reports that he has never smoked. He does not have any smokeless tobacco history on file. He reports that he does not drink alcohol or use illicit drugs.   Review of Systems: As per HPI  Vital Signs: Blood pressure 121/59, pulse 75, temperature 99.4 F (37.4 C), temperature source Oral, resp. rate 16, height 5' 10.87" (1.8 m), weight 195 lb 15.8 oz (88.9 kg), SpO2 99.00%.  Weight trends: Filed Weights   09/06/12 1545  Weight: 195 lb 15.8 oz (88.9 kg)    Physical Exam: General: Vital signs reviewed and noted. Well-developed, well-nourished, in no acute distress; alert, appropriate and cooperative throughout examination.  Head: Normocephalic, atraumatic.  Eyes: PERRL, EOMI, No signs of anemia or jaundince.  Nose: Mucous membranes moist, not inflammed, nonerythematous.  Throat: Oropharynx nonerythematous, no exudate appreciated.   Neck: No deformities, masses, or tenderness noted.Supple, Left carotid bruit, +JVD to the  jaw line.  Lungs:  Normal respiratory effort. Clear to auscultation BL without crackles or wheezes.  Heart: RRR. S1 and S2 normal without gallop, murmur, or rubs.  Abdomen:  Hyperactive BS. Soft, Nondistended, non-tender.  Liver palpable 2-3cm below costal margin. No masses. No appreciable bruit  Extremities: 2+ radial pulses. No appreciable pretibial edema. 2+ edema of posterior thighs, buttocks, and bilateral flanks. R BKA, bandages dry, LLE wrapped in unna boot.  Neurologic: A&O X3, CN II - XII are grossly intact. R BKA, moves other extremities  Skin: No visible rashes, scars.    Lab results: Basic Metabolic Panel:  Recent Labs Lab 09/07/12 1045 09/08/12 0500 09/09/12 0605  NA 136 137 139  K 4.6 4.5 4.6  CL 105 107 107  CO2 22 22 21   GLUCOSE 117* 85 72  BUN 35* 32* 30*  CREATININE 2.08* 2.14* 2.25*  CALCIUM 8.0* 7.7* 7.9*    Liver Function Tests: No results found for this basename: AST, ALT, ALKPHOS, BILITOT, PROT, ALBUMIN,  in the last 168 hours No results found for this basename: LIPASE, AMYLASE,  in the last 168 hours No results found for this basename: AMMONIA,  in the last 168 hours  CBC:  Recent Labs Lab 09/06/12 0951  09/07/12 0600 09/07/12 1601 09/08/12 0500 09/09/12 0605  WBC 14.1*  --  14.2*  --  11.7* 10.8*  NEUTROABS 10.7*  --   --   --   --   --   HGB 6.5*  < > 7.7* 8.6* 8.1* 7.9*  HCT 20.0*  < > 23.1* 25.4* 24.4* 23.9*  MCV 81.3  --  81.3  --  82.4 82.4  PLT 416*  --  383  --  312 336  < > = values in this interval not displayed.  Cardiac Enzymes: No results found for this basename: CKTOTAL, CKMB, CKMBINDEX, TROPONINI,  in the last 168 hours  BNP: No components found with this basename: POCBNP,   CBG:  Recent Labs Lab 09/08/12 1050 09/08/12 1708 09/08/12 2138 09/09/12 0630 09/09/12 0825  GLUCAP 111* 136* 123* 9* 116*    Microbiology: Results for orders placed during the hospital encounter of 09/06/12  URINE CULTURE     Status:  None   Collection Time    09/07/12  1:22 PM      Result Value Range Status   Specimen Description URINE, RANDOM   Final   Special Requests NONE   Final   Culture  Setup Time 09/07/2012 16:58   Final   Colony Count 9,000 COLONIES/ML   Final   Culture INSIGNIFICANT GROWTH   Final   Report Status 09/08/2012 FINAL   Final    Coagulation Studies: No results found for this basename: LABPROT, INR,  in the last 72 hours  Urinalysis:  Recent Labs  09/07/12 1322  COLORURINE YELLOW  LABSPEC 1.013  PHURINE 5.0  GLUCOSEU NEGATIVE  HGBUR NEGATIVE  BILIRUBINUR NEGATIVE  KETONESUR NEGATIVE  PROTEINUR 30*  UROBILINOGEN 1.0  NITRITE NEGATIVE  LEUKOCYTESUR SMALL*      Imaging: US Renal  09/08/2012   *RADIOLOGY REPORT*  Clinical Data:  Acute renal failure  RENAL/URINARY TRACT ULTRASOUND COMPLETE  Comparison:  None.  Findings:  Right Kidney:  Normal in size at 12.5 cm.  The renal parenchyma is diffusely echogenic.  No evidence of mass or hydronephrosis.  Left Kidney:  Normal in size at 12.3 cm.  The renal parenchyma is diffusely echogenic.  No evidence of mass or hydronephrosis.  Bladder:  Appears normal for degree of bladder distention.  Other:  Bilateral pleural effusions. Sludge and small stones layer within the gallbladder lumen.  IMPRESSION:  1.  Negative for hydronephrosis. 2.  Echogenic kidneys bilaterally consistent with underlying medical renal disease. 3.  Small bilateral pleural effusions incidentally noted. 4.  Gallbladder sludge and / or small stones.   Original Report Authenticated By: Jacqulynn Cadet, M.D.   Dg Abd Acute W/chest  09/09/2012   *RADIOLOGY REPORT*  Clinical Data: Vomiting.  Nausea.  ACUTE ABDOMEN SERIES (ABDOMEN 2 VIEW & CHEST 1 VIEW)  Comparison: Multiple exams, including 09/07/2012 and 08/09/2012  Findings: Right-sided PICC line tip:  The upper SVC.  Prior CABG noted with borderline cardiomegaly (cardiothoracic index 55%).  Indistinct pulmonary vasculature noted  compatible with pulmonary venous hypertension.  No overt edema.  No pleural effusion identified.  No free intraperitoneal gas noted within the abdomen.  No significant abnormal air-fluid levels or dilated bowel noted. Lumbar spondylosis and levoconvex scoliosis noted.  IMPRESSION:  1.  Unremarkable bowel gas pattern. 2.  Borderline cardiomegaly with pulmonary venous hypertension.   Original Report Authenticated By: Van Clines, M.D.      Assessment & Plan: Patient is a 62 y.o. male with a PMHx of CAD, s/p 4 vessel CABG '08 and CVA, DM2 with DKA, HTN, and chronic venous and  arterial insufficiency with resulting gangrene and toe amputation 5/24, who was admitted to [facility] on 09/06/2012 for evaluation of right foot osteomyelitis, now POD#3 s/p R BKA, and still with LLE ulcerations requiring IV abx, and now with an AKI.  Acute on chronic kidney disease: He does appear to have some baseline kidney disease, and renal u/s consistent with this as well. UA with mild proteinuria and minimal hematuria. Pt with a long standing h/o DM2 and HTN, which could be source of his chronic renal disease. Worsening acute injury possibly from Vancomycin use in the setting of previously uncontrolled diabetes and HTN. Also with significant fluid retention with JVD, liver distension, edema in his posterior thighs and flank, and CXR consistent with pulmonary vasc congestion. Worsening renal function concerning for pre-renal disorder, possibilities include RAS vs intrinsic renal disease, such as multiple myeloma. Pt non-oliguric and has good uop. With mild acidosis, but acid base status appears stable. Potassium stable. Will need to change abx, consider switching Vanc to Daptomycin for MRSA coverage. - Checking SPEP, UPEP, serum protein/Cr, and kappa/lamda light chains - MRA to evaluate renal arteries for RAS - Stop the Vancomycin - Diureses with Lasix IV 60mg  q8h - Strict I&Os - Daily weights - AM BMP  Anemia: Hgb 6.5 this  admission. Transfused 3u pRBCs this admission. No active bleeding, noted, but hemoccult not done. Hgb 7.9 today. Likely anemia of chronic disease but cannot r/o GI bleed. Keep Hgb>8 given CAD history. - Check hemoccult - Am CBC  Chronic venous and arterial insufficiency: Osteomyelitis in right foot; now POD #3 s/p R BKA. LLE with numerous small ulcerations on the anterior surface and a large ulceration on the posterior surface. Dressings per WOC, and pt on Vancomycin/Zosyn since admission.  - Stop Vanc and change to Daptomycin  HTN: Stable. On Lopressor and hydralazine. Will need to monitor since starting Lasix; may need to decrease hydralazine if BP drops.   DM: Stable. A1c 5.9 this admission. Previously uncontrolled per pt and family, A1c 10.9 07/28/12. Family attributes improved control to the fact that he has been in a SNF and has been receiving insulin since June.  DVT PPx: Heparin   Patient history and plan of care reviewed with attending, Dr. Jonnie Finner.   Otho Bellows, MD  PGYII, Internal Medicine Resident 09/09/2012, 10:37 AM  Patient seen and examined.  Agree with assessment and plan as above. Kelly Splinter  MD Pager [phone]    Cell  [phone] 09/09/2012, 4:17 PM

## 2012-09-09 NOTE — Clinical Social Work Note (Signed)
Clinical Social Work  CSW received phone call from RN that pt's sisters wanted to see CSW. CSW met with pt and family to address discharge plan. Pt's sisters wanted to know more infomedCSW informed pt that a bed offer has been extended from [facility]. Pt shared that he would like to stay close to the hospital. CSW offered to expand search to [place], which pt agreed. CSW encouraged pt's sisters to visit facilities. Pt is agreeable to this. CSW also shared with pt and family that a bed choice needs to be made as pt will likely be ready for discharge. CSW will continue to follow.   Darden Dates, MSW, LCSW Clinical Social Work Weekend Coverage  [phone]

## 2012-09-09 NOTE — Progress Notes (Signed)
ANTIBIOTIC CONSULT NOTE - INITIAL  Pharmacy Consult for Daptomycin Indication: LLE cellulitis  No Known Allergies  Patient Measurements: Height: 5' 10.87" (180 cm) (from 07/27/12 data) Weight: 195 lb 15.8 oz (88.9 kg) (from 07/31/12 at 04:11 AM) IBW/kg (Calculated) : 74.99 Vital Signs: Temp: 100 F (37.8 C) (07/05 1352) BP: 136/69 mmHg (07/05 1352) Pulse Rate: 84 (07/05 1352) Intake/Output from previous day: 07/04 0701 - 07/05 0700 In: 600 [P.O.:600] Out: 2100 [Urine:2100] Intake/Output from this shift: Total I/O In: 360 [P.O.:360] Out: 550 [Urine:550]  Labs:  Recent Labs  09/07/12 0600 09/07/12 1045 09/07/12 1322 09/07/12 1601 09/08/12 0500 09/09/12 0605  WBC 14.2*  --   --   --  11.7* 10.8*  HGB 7.7*  --   --  8.6* 8.1* 7.9*  PLT 383  --   --   --  312 336  LABCREA  --   --  50.75  --   --   --   CREATININE 2.08* 2.08*  --   --  2.14* 2.25*   Estimated Creatinine Clearance: 36.1 ml/min (by C-G formula based on Cr of 2.25).  Recent Labs  09/06/12 1701  VANCORANDOM 17.9     Microbiology: Recent Results (from the past 720 hour(s))  URINE CULTURE     Status: None   Collection Time    09/07/12  1:22 PM      Result Value Range Status   Specimen Description URINE, RANDOM   Final   Special Requests NONE   Final   Culture  Setup Time 09/07/2012 16:58   Final   Colony Count 9,000 COLONIES/ML   Final   Culture INSIGNIFICANT GROWTH   Final   Report Status 09/08/2012 FINAL   Final    Medical History: Past Medical History  Diagnosis Date  . Hypertension   . Diabetes mellitus without complication   . 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 Myocardial infarction   . Depression   . Shortness of breath     with activy  . Hx of seasonal allergies   . GERD (gastroesophageal reflux disease)   . Cataract     Medications:  Scheduled:  . bisacodyl  10 mg Rectal Daily  . docusate sodium  100 mg Oral BID  . furosemide  60 mg Intravenous Q8H  . heparin subcutaneous   5,000 Units Subcutaneous Q8H  . hydrALAZINE  50 mg Oral Q8H  . insulin aspart  0-15 Units Subcutaneous TID WC  . insulin detemir  11 Units Subcutaneous QHS  . metoCLOPramide  5 mg Oral TID AC  . metoprolol tartrate  25 mg Oral BID  . pantoprazole  40 mg Oral BID  . piperacillin-tazobactam  3.375 g Intravenous Q8H  . polyethylene glycol  17 g Oral BID   Infusions:   Assessment: 62 y/o male patient admitted with osteo of right foot, now s/p R BKA. Patient receiving vanc and zosyn , to transition to daptomycin d/t renal dysfunction. Will start with 4mg  /kg per day and adjust if crcl<30.  Plan:  Daptomycin 350mg  IV q24 and monitor renal functino with weekly ck.  Davonna Belling, PharmD, BCPS Pager [phone] 09/09/2012,3:23 PM

## 2012-09-09 NOTE — Progress Notes (Signed)
PATIENT ID: [name]   3 Days Post-Op Procedure(s) (LRB): AMPUTATION BELOW KNEE (Right)  Subjective: Patient is feeling well this am with regards to R LE. Reports pain is well controlled with current pain mgmt. No other complaints or concerns.    Objective:  Filed Vitals:   09/09/12 0627  BP: 121/59  Pulse: 75  Temp: 99.4 F (37.4 C)  Resp: 16    Awake, alert, oritentated  R LE with mild swelling, dressing in place  Surrounding skin with no swelling, erythema, warmth L LE wound with clean, dry and intact dressing, able to dorsiflex and plantarflex without pain  Labs:    Recent Labs  09/06/12 1500 09/07/12 0600 09/07/12 1601 09/08/12 0500 09/09/12 0605  HGB 7.9* 7.7* 8.6* 8.1* 7.9*   Recent Labs  09/08/12 0500 09/09/12 0605  WBC 11.7* 10.8*  RBC 2.96* 2.90*  HCT 24.4* 23.9*  PLT 312 336   Recent Labs  09/08/12 0500 09/09/12 0605  NA 137 139  K 4.5 4.6  CL 107 107  CO2 22 21  BUN 32* 30*  CREATININE 2.14* 2.25*  GLUCOSE 85 72  CALCIUM 7.7* 7.9*    Assessment and Plan: 3 days s/p right BKA  Pain well controlled, continue current pain mgmt  ABLA- expected postoperatively, trending downward, s/p transfusion 3 units packed RBC, mgmt is complex with AKI, mgmt per primary team  Left lower extremity wound evaluated by [place] nurse, Louretta Parma boot with weekly changes, no changes/concerns today D/C to SNF when medically stable per primary team, greatly appreciate their care of this complex patient  VTE proph: per primary team

## 2012-09-09 NOTE — Progress Notes (Signed)
[facility]                                                                                Patient Demographics  [name], is a 62 y.o. male, DOB - 13-Aug-1950, [id number], [id number]  Admit date - 09/06/2012  Admitting Physician Kerin Salen, MD  Outpatient Primary MD for the patient is No PCP Per Patient  LOS - 3   No chief complaint on file.       Assessment & Plan    Acute kidney injury   - Likely worsened due to anemia, patient has been on vancomycin. Vancomycin dose being managed by pharmacy.  - Renal function has improved some, he status post 3 units packed RBC transfusion and IV fluids which are ongoing, however still not close to baseline, renal ultrasound is stable however despite generous IV fluids and packed RBC transfusion his renal function has not improved, I will consult nephrology.     Constipation x3 days with nausea vomiting in the morning of 09/09/2012.  Patient is passing flatus, he has no abdominal pain, will check KUB, give him Zofran, patient aggressive bowel regimen for constipation which could be causing his nausea vomiting.     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 Mayer Camel orthopedic surgeon on 09/07/2012, he is doubtful that patient will be aable to salvage his left leg. as that also looks infected, for now empiric antibiotics to be continued.    Type II or unspecified type diabetes mellitus    A.m. Sugars running low, discontinued nighttime sliding scale and reduced Levemir dose.   Lab Results  Component Value Date   HGBA1C 5.9* 09/06/2012    CBG (last 3)   Recent Labs  09/08/12 2138 09/09/12 0630 09/09/12 0825  GLUCAP 123* 64* 116*      Anemia: Likely anemia of chronic disease worsened by perioperative blood loss and dilution with IV fluids, He status post 3 units of packed RBC transfusion, his anemia panel is  stable, he is Hemoccult negative x2. Monitor H&H on intermittent basis.     H/O: CVA (cerebrovascular accident), CAD CABG Continue aspirin along with beta blocker. He is chest pain-free, try to keep hemoglobin above 8.     Hypertension: Continue metoprolol, hydralazine       Code Status: Full  Family Communication: None present  Disposition Plan: Per orthopedics   Procedures R. BKA done by primary team, renal ultrasound ordered   DVT Prophylaxis   Heparin   Lab Results  Component Value Date   PLT 336 09/09/2012    Medications  Scheduled Meds: . bisacodyl  10 mg Rectal Daily  . docusate sodium  100 mg Oral BID  . heparin subcutaneous  5,000 Units Subcutaneous Q8H  . hydrALAZINE  50 mg Oral Q8H  . insulin aspart  0-15 Units Subcutaneous TID WC  . insulin detemir  11 Units Subcutaneous QHS  . metoCLOPramide  5 mg Oral TID AC  . metoprolol tartrate  25 mg Oral BID  . pantoprazole  40 mg Oral BID  . piperacillin-tazobactam  3.375 g  Intravenous Q8H  . polyethylene glycol  17 g Oral BID  . vancomycin  1,500 mg Intravenous Q24H   Continuous Infusions:   PRN Meds:.acetaminophen, bisacodyl, HYDROmorphone (DILAUDID) injection, magnesium hydroxide, ondansetron, oxyCODONE, sodium chloride  Antibiotics    Anti-infectives   Start     Dose/Rate Route Frequency Ordered Stop   09/07/12 0600  vancomycin (VANCOCIN) 1,500 mg in sodium chloride 0.9 % 500 mL IVPB     1,500 mg 250 mL/hr over 120 Minutes Intravenous Every 24 hours 09/06/12 2023     09/06/12 1645  piperacillin-tazobactam (ZOSYN) IVPB 3.375 g    Comments:  For necrotic right lower extremity.   3.375 g 12.5 mL/hr over 240 Minutes Intravenous 3 times per day 09/06/12 1613     09/06/12 1615  vancomycin (VANCOCIN) 1,500 mg in sodium chloride 0.9 % 500 mL IVPB  Status:  Discontinued    Comments:  For necrotic right lower extremity.   1,500 mg 250 mL/hr over 120 Minutes Intravenous Every 24 hours 09/06/12 1613  09/06/12 1625   09/06/12 1124  ceFAZolin (ANCEF) injection  Status:  Discontinued       As needed 09/06/12 1125 09/06/12 1313   09/06/12 1103  ceFAZolin (ANCEF) 2-3 GM-% IVPB SOLR    Comments:  Ara Kussmaul: cabinet override      09/06/12 1103 09/06/12 2314   09/06/12 0600  ceFAZolin (ANCEF) IVPB 2 g/50 mL premix     2 g 100 mL/hr over 30 Minutes Intravenous On call to O.R. 09/05/12 1210 09/06/12 1149       Time Spent in minutes   40   Denelle Capurro K M.D on 09/09/2012 at 8:49 AM  Between 7am to 7pm - Pager - [phone]  After 7pm go to [email or web address] - password [email or web address]  And look for the night coverage person covering for me after hours  [facility] Office  [phone]    Subjective:   [name] today has, No headache, No chest pain, No abdominal pain  No new weakness tingling or numbness, No Cough - SOB. Had nausea with emesis this morning and does have constipation with no BM in the last 3 days.  Objective:   Filed Vitals:   09/08/12 1700 09/08/12 2135 09/09/12 0229 09/09/12 0627  BP: 131/61 132/61 127/65 121/59  Pulse: 89 95 89 75  Temp: 100.5 F (38.1 C) 100.2 F (37.9 C) 101 F (38.3 C) 99.4 F (37.4 C)  TempSrc: Oral     Resp: 16 16 16 16   Height:      Weight:      SpO2: 98% 99% 98% 99%    Wt Readings from Last 3 Encounters:  09/06/12 88.9 kg (195 lb 15.8 oz)  09/06/12 88.9 kg (195 lb 15.8 oz)  07/31/12 88.9 kg (195 lb 15.8 oz)     Intake/Output Summary (Last 24 hours) at 09/09/12 0849 Last data filed at 09/09/12 [id number]  Gross per 24 hour  Intake    600 ml  Output   2100 ml  Net  -1500 ml    Exam Awake Alert, Oriented X 3, No new F.N deficits, Normal affect [place].AT,PERRAL Supple Neck,No JVD, No cervical lymphadenopathy appriciated.  Symmetrical Chest wall movement, Good air movement bilaterally, CTAB RRR,No Gallops,Rubs or new Murmurs, No Parasternal Heave +ve B.Sounds, Abd Soft, Non tender, No organomegaly appriciated, No  rebound - guarding or rigidity. R. BKA, left leg and foot also in bandage upto his calf.   Data Review   Micro Results Recent Results (  from the past 240 hour(s))  URINE CULTURE     Status: None   Collection Time    09/07/12  1:22 PM      Result Value Range Status   Specimen Description URINE, RANDOM   Final   Special Requests NONE   Final   Culture  Setup Time 09/07/2012 16:58   Final   Colony Count 9,000 COLONIES/ML   Final   Culture INSIGNIFICANT GROWTH   Final   Report Status 09/08/2012 FINAL   Final    Radiology Reports Dg Abd 1 View  08/09/2012   *RADIOLOGY REPORT*  Clinical Data: Abdominal pain and vomiting.  ABDOMEN - 1 VIEW  Comparison: None.  Findings: Bowel gas pattern is unremarkable.  No abnormal abdominal calcification is seen.  There is some enthesopathic change about the pelvis.  IMPRESSION: No acute abnormality.   Original Report Authenticated By: Orlean Patten, M.D.   Dg Chest Port 1 View  09/07/2012   *RADIOLOGY REPORT*  Clinical Data: Fever  PORTABLE CHEST - 1 VIEW  Comparison: 08/09/2012  Findings: There is been previous median sternotomy and CABG.  Right arm PICC has been withdrawn somewhat, the tip now in the SVC just above the azygos level.  There is worsened pulmonary density, right more than left, probably representing interstitial edema.  One could not rule out developing widespread pneumonia.  No effusions. Bony structures are unremarkable.  IMPRESSION: Some withdrawal of the right arm PICC with the tip now in the SVC just above the azygos level.  Worsened pulmonary density, more notable on the right than the left.  This is most consistent with developing pulmonary edema, though pneumonia is possible as well.   Original Report Authenticated By: Nelson Chimes, M.D.   Dg Chest Port 1 View  08/09/2012   *RADIOLOGY REPORT*  Clinical Data: PICC placement.  PORTABLE CHEST - 1 VIEW  Comparison: PA and lateral chest 08/04/2012.  Findings: Right PICC is in place with the  tip projecting over the lower superior vena cava.  Lungs are clear.  Heart size is upper normal.  No pneumothorax or pleural fluid.  IMPRESSION: Tip of right PICC projects over the lower superior vena cava.  No acute abnormality.   Original Report Authenticated By: Orlean Patten, M.D.    [facility] [place] 09/06/12 [phone] 09/06/12 1500 09/07/12 0600 09/07/12 1601 09/08/12 0500 09/09/12 0605  WBC 14.1*  --  14.2*  --  11.7* 10.8*  HGB 6.5* 7.9* 7.7* 8.6* 8.1* 7.9*  HCT 20.0* 24.2* 23.1* 25.4* 24.4* 23.9*  PLT 416*  --  383  --  312 336  MCV 81.3  --  81.3  --  82.4 82.4  MCH 26.4  --  27.1  --  27.4 27.2  MCHC 32.5  --  33.3  --  33.2 33.1  RDW 15.4  --  15.4  --  15.8* 15.7*  LYMPHSABS 1.8  --   --   --   --   --   MONOABS 1.5*  --   --   --   --   --   EOSABS 0.0  --   --   --   --   --   BASOSABS 0.0  --   --   --   --   --     Chemistries   Recent Labs Lab 09/06/12 0951 09/07/12 0600 09/07/12 1045 09/08/12 0500 09/09/12 0605  NA 134* 137 136 137 139  K 4.7 4.5 4.6 4.5  4.6  CL 101 106 105 107 107  CO2 21 21 22 22 21   GLUCOSE 178* 125* 117* 85 72  BUN 40* 36* 35* 32* 30*  CREATININE 2.20* 2.08* 2.08* 2.14* 2.25*  CALCIUM 8.5 8.0* 8.0* 7.7* 7.9*   ------------------------------------------------------------------------------------------------------------------ estimated creatinine clearance is 36.1 ml/min (by C-G formula based on Cr of 2.25). ------------------------------------------------------------------------------------------------------------------  Recent Labs  09/06/12 1701  HGBA1C 5.9*   ------------------------------------------------------------------------------------------------------------------ No results found for this basename: CHOL, HDL, LDLCALC, TRIG, CHOLHDL, LDLDIRECT,  in the last 72 hours ------------------------------------------------------------------------------------------------------------------ No results found for this  basename: TSH, T4TOTAL, FREET3, T3FREE, THYROIDAB,  in the last 72 hours ------------------------------------------------------------------------------------------------------------------  Recent Labs  09/06/12 1701  VITAMINB12 693  FOLATE 8.7  FERRITIN 5292*  TIBC 121*  IRON 67  RETICCTPCT 2.0    Coagulation profile  Recent Labs Lab 09/06/12 0951  INR 1.32    No results found for this basename: DDIMER,  in the last 72 hours  Cardiac Enzymes No results found for this basename: CK, CKMB, TROPONINI, MYOGLOBIN,  in the last 168 hours ------------------------------------------------------------------------------------------------------------------ No components found with this basename: POCBNP,

## 2012-09-10 ENCOUNTER — Inpatient Hospital Stay (HOSPITAL_COMMUNITY): Payer: Medicare Other

## 2012-09-10 LAB — CBC
MCH: 27.1 pg (ref 26.0–34.0)
MCHC: 32.8 g/dL (ref 30.0–36.0)
MCV: 82.8 fL (ref 78.0–100.0)
Platelets: 343 10*3/uL (ref 150–400)
RDW: 15.7 % — ABNORMAL HIGH (ref 11.5–15.5)

## 2012-09-10 LAB — TYPE AND SCREEN
Unit division: 0
Unit division: 0

## 2012-09-10 LAB — BASIC METABOLIC PANEL
CO2: 25 mEq/L (ref 19–32)
Calcium: 8 mg/dL — ABNORMAL LOW (ref 8.4–10.5)
Creatinine, Ser: 2.22 mg/dL — ABNORMAL HIGH (ref 0.50–1.35)
GFR calc Af Amer: 35 mL/min — ABNORMAL LOW (ref >90)
GFR calc non Af Amer: 30 mL/min — ABNORMAL LOW (ref >90)

## 2012-09-10 LAB — CK: Total CK: 35 U/L (ref 7–232)

## 2012-09-10 MED ORDER — INSULIN DETEMIR 100 UNIT/ML ~~LOC~~ SOLN
8.0000 [IU] | Freq: Every day | SUBCUTANEOUS | Status: DC
  Administered 2012-09-10: 8 [IU] via SUBCUTANEOUS
  Filled 2012-09-10 (×2): qty 0.08

## 2012-09-10 NOTE — Progress Notes (Signed)
[facility]                                                                                Patient Demographics  [name], is a 62 y.o. male, DOB - 10/11/1950, [id number], [id number]  Admit date - 09/06/2012  Admitting Physician Kerin Salen, MD  Outpatient Primary MD for the patient is No PCP Per Patient  LOS - 4   No chief complaint on file.       Assessment & Plan    Acute kidney injury   - Likely worsened due to anemia, question underlying chronic kidney disease  - Renal function has improved some, he status post 3 units packed RBC transfusion and IV fluids which are ongoing, however still not close to baseline, renal ultrasound is stable however despite generous IV fluids and packed RBC transfusion his renal function has not improved, renal has been consulted appreciate their input. Note his vancomycin has been switched to daptomycin by renal. This was done on 09/09/2012.     Constipation x3 days with nausea vomiting in the morning of 09/09/2012.  KUB stable, all resolved after bowel regimen and bowel movements. Not nauseated now.     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 Mayer Camel orthopedic surgeon on 09/07/2012, he is doubtful that patient will be aable to salvage his left leg. as that also looks infected, for now empiric antibiotics to be continued.    Type II or unspecified type diabetes mellitus    A.m. Sugars running low, discontinued nighttime sliding scale and reduced Levemir dose.   Lab Results  Component Value Date   HGBA1C 5.9* 09/06/2012    CBG (last 3)   Recent Labs  09/09/12 1606 09/09/12 2238 09/10/12 0657  GLUCAP 202* 114* 62*      Anemia: Likely anemia of chronic disease worsened by perioperative blood loss and dilution with IV fluids, He status post 3 units of packed RBC transfusion, his anemia panel is stable, he is  Hemoccult negative x2. Monitor H&H on intermittent basis.     H/O: CVA (cerebrovascular accident), CAD CABG Continue aspirin along with beta blocker. He is chest pain-free, try to keep hemoglobin above 8.     Hypertension: Continue metoprolol, hydralazine       Code Status: Full  Family Communication: None present  Disposition Plan: Per orthopedics   Procedures R. BKA done by primary team, renal ultrasound ordered   DVT Prophylaxis   Heparin   Lab Results  Component Value Date   PLT 343 09/10/2012    Medications  Scheduled Meds: . bisacodyl  10 mg Rectal Daily  . DAPTOmycin (CUBICIN)  IV  350 mg Intravenous Q24H  . docusate sodium  100 mg Oral BID  . furosemide  60 mg Intravenous Q8H  . heparin subcutaneous  5,000 Units Subcutaneous Q8H  . hydrALAZINE  50 mg Oral Q8H  . insulin aspart  0-15 Units Subcutaneous TID WC  . insulin detemir  11 Units Subcutaneous QHS  . metoCLOPramide  5 mg Oral TID AC  . metoprolol tartrate  25 mg  Oral BID  . pantoprazole  40 mg Oral BID  . piperacillin-tazobactam  3.375 g Intravenous Q8H  . polyethylene glycol  17 g Oral BID   Continuous Infusions:   PRN Meds:.acetaminophen, bisacodyl, HYDROmorphone (DILAUDID) injection, magnesium hydroxide, ondansetron, oxyCODONE, sodium chloride  Antibiotics    Anti-infectives   Start     Dose/Rate Route Frequency Ordered Stop   09/09/12 1700  DAPTOmycin (CUBICIN) 350 mg in sodium chloride 0.9 % IVPB     350 mg 214 mL/hr over 30 Minutes Intravenous Every 24 hours 09/09/12 1530     09/07/12 0600  vancomycin (VANCOCIN) 1,500 mg in sodium chloride 0.9 % 500 mL IVPB  Status:  Discontinued     1,500 mg 250 mL/hr over 120 Minutes Intravenous Every 24 hours 09/06/12 2023 09/09/12 1449   09/06/12 1645  piperacillin-tazobactam (ZOSYN) IVPB 3.375 g    Comments:  For necrotic right lower extremity.   3.375 g 12.5 mL/hr over 240 Minutes Intravenous 3 times per day 09/06/12 1613     09/06/12 1615   vancomycin (VANCOCIN) 1,500 mg in sodium chloride 0.9 % 500 mL IVPB  Status:  Discontinued    Comments:  For necrotic right lower extremity.   1,500 mg 250 mL/hr over 120 Minutes Intravenous Every 24 hours 09/06/12 1613 09/06/12 1625   09/06/12 1124  ceFAZolin (ANCEF) injection  Status:  Discontinued       As needed 09/06/12 1125 09/06/12 1313   09/06/12 1103  ceFAZolin (ANCEF) 2-3 GM-% IVPB SOLR    Comments:  Ara Kussmaul: cabinet override      09/06/12 1103 09/06/12 2314   09/06/12 0600  ceFAZolin (ANCEF) IVPB 2 g/50 mL premix     2 g 100 mL/hr over 30 Minutes Intravenous On call to O.R. 09/05/12 1210 09/06/12 1149       Time Spent in minutes   40   Lavone Weisel K M.D on 09/10/2012 at 8:00 AM  Between 7am to 7pm - Pager - [phone]  After 7pm go to [email or web address] - password [email or web address]  And look for the night coverage person covering for me after hours  [facility] Office  [phone]    Subjective:   [name] today has, No headache, No chest pain, No abdominal pain  No new weakness tingling or numbness, No Cough - SOB. Had nausea with emesis this morning and does have constipation with no BM in the last 3 days.  Objective:   Filed Vitals:   09/09/12 1754 09/09/12 2047 09/10/12 0300 09/10/12 0630  BP:  144/61  141/66  Pulse:  92  86  Temp: 100 F (37.8 C) 101.7 F (38.7 C) 100.1 F (37.8 C) 98.2 F (36.8 C)  TempSrc:  Oral Oral Oral  Resp:  16  18  Height:      Weight:      SpO2:  95%  100%    Wt Readings from Last 3 Encounters:  09/06/12 88.9 kg (195 lb 15.8 oz)  09/06/12 88.9 kg (195 lb 15.8 oz)  07/31/12 88.9 kg (195 lb 15.8 oz)     Intake/Output Summary (Last 24 hours) at 09/10/12 0800 Last data filed at 09/10/12 0630  Gross per 24 hour  Intake    690 ml  Output   3530 ml  Net  -2840 ml    Exam Awake Alert, Oriented X 3, No new F.N deficits, Normal affect [place].AT,PERRAL Supple Neck,No JVD, No cervical lymphadenopathy  appriciated.  Symmetrical Chest wall movement, Good air movement bilaterally,  CTAB RRR,No Gallops,Rubs or new Murmurs, No Parasternal Heave +ve B.Sounds, Abd Soft, Non tender, No organomegaly appriciated, No rebound - guarding or rigidity. R. BKA, left leg and foot also in bandage upto his calf.   Data Review   Micro Results Recent Results (from the past 240 hour(s))  URINE CULTURE     Status: None   Collection Time    09/07/12  1:22 PM      Result Value Range Status   Specimen Description URINE, RANDOM   Final   Special Requests NONE   Final   Culture  Setup Time 09/07/2012 16:58   Final   Colony Count 9,000 COLONIES/ML   Final   Culture INSIGNIFICANT GROWTH   Final   Report Status 09/08/2012 FINAL   Final    Radiology Reports Dg Abd 1 View  08/09/2012   *RADIOLOGY REPORT*  Clinical Data: Abdominal pain and vomiting.  ABDOMEN - 1 VIEW  Comparison: None.  Findings: Bowel gas pattern is unremarkable.  No abnormal abdominal calcification is seen.  There is some enthesopathic change about the pelvis.  IMPRESSION: No acute abnormality.   Original Report Authenticated By: Orlean Patten, M.D.   Dg Chest Port 1 View  09/07/2012   *RADIOLOGY REPORT*  Clinical Data: Fever  PORTABLE CHEST - 1 VIEW  Comparison: 08/09/2012  Findings: There is been previous median sternotomy and CABG.  Right arm PICC has been withdrawn somewhat, the tip now in the SVC just above the azygos level.  There is worsened pulmonary density, right more than left, probably representing interstitial edema.  One could not rule out developing widespread pneumonia.  No effusions. Bony structures are unremarkable.  IMPRESSION: Some withdrawal of the right arm PICC with the tip now in the SVC just above the azygos level.  Worsened pulmonary density, more notable on the right than the left.  This is most consistent with developing pulmonary edema, though pneumonia is possible as well.   Original Report Authenticated By: Nelson Chimes,  M.D.   Dg Chest Port 1 View  08/09/2012   *RADIOLOGY REPORT*  Clinical Data: PICC placement.  PORTABLE CHEST - 1 VIEW  Comparison: PA and lateral chest 08/04/2012.  Findings: Right PICC is in place with the tip projecting over the lower superior vena cava.  Lungs are clear.  Heart size is upper normal.  No pneumothorax or pleural fluid.  IMPRESSION: Tip of right PICC projects over the lower superior vena cava.  No acute abnormality.   Original Report Authenticated By: Orlean Patten, M.D.    [facility] 09/06/12 [phone]  09/07/12 0600 09/07/12 1601 09/08/12 0500 09/09/12 0605 09/10/12 0430  WBC 14.1*  --  14.2*  --  11.7* 10.8* 10.8*  HGB 6.5*  < > 7.7* 8.6* 8.1* 7.9* 7.9*  HCT 20.0*  < > 23.1* 25.4* 24.4* 23.9* 24.1*  PLT 416*  --  383  --  312 336 343  MCV 81.3  --  81.3  --  82.4 82.4 82.8  MCH 26.4  --  27.1  --  27.4 27.2 27.1  MCHC 32.5  --  33.3  --  33.2 33.1 32.8  RDW 15.4  --  15.4  --  15.8* 15.7* 15.7*  LYMPHSABS 1.8  --   --   --   --   --   --   MONOABS 1.5*  --   --   --   --   --   --   EOSABS 0.0  --   --   --   --   --   --  BASOSABS 0.0  --   --   --   --   --   --   < > = values in this interval not displayed.  Chemistries   Recent Labs Lab 09/07/12 0600 09/07/12 1045 09/08/12 0500 09/09/12 0605 09/10/12 0430  NA 137 136 137 139 138  K 4.5 4.6 4.5 4.6 4.3  CL 106 105 107 107 107  CO2 21 22 22 21 25   GLUCOSE 125* 117* 85 72 87  BUN 36* 35* 32* 30* 33*  CREATININE 2.08* 2.08* 2.14* 2.25* 2.22*  CALCIUM 8.0* 8.0* 7.7* 7.9* 8.0*   ------------------------------------------------------------------------------------------------------------------ estimated creatinine clearance is 36.6 ml/min (by C-G formula based on Cr of 2.22). ------------------------------------------------------------------------------------------------------------------ No results found for this basename: HGBA1C,  in the last 72  hours ------------------------------------------------------------------------------------------------------------------ No results found for this basename: CHOL, HDL, LDLCALC, TRIG, CHOLHDL, LDLDIRECT,  in the last 72 hours ------------------------------------------------------------------------------------------------------------------ No results found for this basename: TSH, T4TOTAL, FREET3, T3FREE, THYROIDAB,  in the last 72 hours ------------------------------------------------------------------------------------------------------------------ No results found for this basename: VITAMINB12, FOLATE, FERRITIN, TIBC, IRON, RETICCTPCT,  in the last 72 hours  Coagulation profile  Recent Labs Lab 09/06/12 0951  INR 1.32    No results found for this basename: DDIMER,  in the last 72 hours  Cardiac Enzymes No results found for this basename: CK, CKMB, TROPONINI, MYOGLOBIN,  in the last 168 hours ------------------------------------------------------------------------------------------------------------------ No components found with this basename: POCBNP,

## 2012-09-10 NOTE — Progress Notes (Signed)
CSW attempted to follow up with pt to notify that [facility] was unable to offer a bed as Weekend CSW had expanded search to [place], however pt was sleeping soundly and did not awake to this CSW knocking on door.   Weekend CSW provided other bed offers to pt on 7/5.  Weekday CSW to follow up with pt re: decision for SNF placement.  Drake Leach, MSW, [place] Work Weekend coverage [phone]

## 2012-09-10 NOTE — Progress Notes (Signed)
[facility] - PROGRESS NOTE Resident Note   Please see below for attending addendum to resident note.  Subjective:   Febrile overnight. Pt w/o complaints this morning; states that he is feeling much better today.   Objective:    Vital Signs:   Temp:  [98.2 F (36.8 C)-101.7 F (38.7 C)] 98.2 F (36.8 C) (07/06 0630) Pulse Rate:  [84-92] 86 (07/06 0630) Resp:  [16-18] 18 (07/06 0630) BP: (136-144)/(61-69) 141/66 mmHg (07/06 0630) SpO2:  [95 %-100 %] 100 % (07/06 0630) Last BM Date: 09/09/12  24-hour weight change: Weight change:   Weight trends: Filed Weights   09/06/12 1545  Weight: 195 lb 15.8 oz (88.9 kg)    Intake/Output:  07/05 0701 - 07/06 0700 In: 810 [P.O.:360; IV Piggyback:450] Out: [id number] [Urine:3530]  Physical Exam: General: Vital signs reviewed and noted. Well-developed, well-nourished, in no acute distress; alert, appropriate and cooperative throughout examination.  Neck: No deformities, masses, or tenderness noted.Supple, Left carotid bruit, +JVD to the jaw line.   Lungs:  Normal respiratory effort. Clear to auscultation BL without crackles or wheezes.  Heart: RRR. S1 and S2 normal without gallop, murmur, or rubs.  Abdomen:  BS normoactive. Soft, Nondistended, non-tender.  No masses. Liver palpable 2-3cm.  Extremities: 2+ pitting edema of posterior thighs, buttocks, and bilateral flanks- improved from 7/5     Labs: Basic Metabolic Panel:  Recent Labs Lab 09/08/12 0500 09/09/12 0605 09/10/12 0430  NA 137 139 138  K 4.5 4.6 4.3  CL 107 107 107  CO2 22 21 25   GLUCOSE 85 72 87  BUN 32* 30* 33*  CREATININE 2.14* 2.25* 2.22*  CALCIUM 7.7* 7.9* 8.0*    Liver Function Tests: No results found for this basename: AST, ALT, ALKPHOS, BILITOT, PROT, ALBUMIN,  in the last 168 hours No results found for this basename: LIPASE, AMYLASE,  in the last 168 hours No results found for this basename: AMMONIA,  in the last 168  hours  CBC:  Recent Labs Lab 09/06/12 0951  09/08/12 0500 09/09/12 0605 09/10/12 0430  WBC 14.1*  < > 11.7* 10.8* 10.8*  NEUTROABS 10.7*  --   --   --   --   HGB 6.5*  < > 8.1* 7.9* 7.9*  HCT 20.0*  < > 24.4* 23.9* 24.1*  MCV 81.3  < > 82.4 82.4 82.8  PLT 416*  < > 312 336 343  < > = values in this interval not displayed.  Cardiac Enzymes:  Recent Labs Lab 09/10/12 0430  CKTOTAL 35    BNP: No results found for this basename: PROBNP,  in the last 168 hours  CBG:  Recent Labs Lab 09/09/12 1132 09/09/12 1606 09/09/12 2238 09/10/12 0657 09/10/12 0805  GLUCAP 108* 202* 114* 62* 101*    Microbiology: Results for orders placed during the hospital encounter of 09/06/12  URINE CULTURE     Status: None   Collection Time    09/07/12  1:22 PM      Result Value Range Status   Specimen Description URINE, RANDOM   Final   Special Requests NONE   Final   Culture  Setup Time 09/07/2012 16:58   Final   Colony Count 9,000 COLONIES/ML   Final   Culture INSIGNIFICANT GROWTH   Final   Report Status 09/08/2012 FINAL   Final    Coagulation Studies: No results found for this basename: LABPROT, INR,  in the last 72 hours  Urinalysis:    Component Value  Date/Time   COLORURINE YELLOW 09/07/2012 1322   APPEARANCEUR CLOUDY* 09/07/2012 1322   LABSPEC 1.013 09/07/2012 1322   PHURINE 5.0 09/07/2012 1322   GLUCOSEU NEGATIVE 09/07/2012 1322   HGBUR NEGATIVE 09/07/2012 1322   BILIRUBINUR NEGATIVE 09/07/2012 1322   KETONESUR NEGATIVE 09/07/2012 1322   PROTEINUR 30* 09/07/2012 1322   UROBILINOGEN 1.0 09/07/2012 1322   NITRITE NEGATIVE 09/07/2012 1322   LEUKOCYTESUR SMALL* 09/07/2012 1322     Imaging: US Renal  09/08/2012   *RADIOLOGY REPORT*  Clinical Data:  Acute renal failure  RENAL/URINARY TRACT ULTRASOUND COMPLETE  Comparison:  None.  Findings:  Right Kidney:  Normal in size at 12.5 cm.  The renal parenchyma is diffusely echogenic.  No evidence of mass or hydronephrosis.  Left Kidney:  Normal in  size at 12.3 cm.  The renal parenchyma is diffusely echogenic.  No evidence of mass or hydronephrosis.  Bladder:  Appears normal for degree of bladder distention.  Other:  Bilateral pleural effusions. Sludge and small stones layer within the gallbladder lumen.  IMPRESSION:  1.  Negative for hydronephrosis. 2.  Echogenic kidneys bilaterally consistent with underlying medical renal disease. 3.  Small bilateral pleural effusions incidentally noted. 4.  Gallbladder sludge and / or small stones.   Original Report Authenticated By: Jacqulynn Cadet, M.D.   Dg Abd Acute W/chest  09/09/2012   *RADIOLOGY REPORT*  Clinical Data: Vomiting.  Nausea.  ACUTE ABDOMEN SERIES (ABDOMEN 2 VIEW & CHEST 1 VIEW)  Comparison: Multiple exams, including 09/07/2012 and 08/09/2012  Findings: Right-sided PICC line tip:  The upper SVC.  Prior CABG noted with borderline cardiomegaly (cardiothoracic index 55%).  Indistinct pulmonary vasculature noted compatible with pulmonary venous hypertension.  No overt edema.  No pleural effusion identified.  No free intraperitoneal gas noted within the abdomen.  No significant abnormal air-fluid levels or dilated bowel noted. Lumbar spondylosis and levoconvex scoliosis noted.  IMPRESSION:  1.  Unremarkable bowel gas pattern. 2.  Borderline cardiomegaly with pulmonary venous hypertension.   Original Report Authenticated By: Van Clines, M.D.      Medications:    Infusions:    Scheduled Medications: . bisacodyl  10 mg Rectal Daily  . DAPTOmycin (CUBICIN)  IV  350 mg Intravenous Q24H  . docusate sodium  100 mg Oral BID  . furosemide  60 mg Intravenous Q8H  . heparin subcutaneous  5,000 Units Subcutaneous Q8H  . hydrALAZINE  50 mg Oral Q8H  . insulin aspart  0-15 Units Subcutaneous TID WC  . insulin detemir  8 Units Subcutaneous QHS  . metoCLOPramide  5 mg Oral TID AC  . metoprolol tartrate  25 mg Oral BID  . pantoprazole  40 mg Oral BID  . piperacillin-tazobactam  3.375 g  Intravenous Q8H  . polyethylene glycol  17 g Oral BID    PRN Medications: acetaminophen, bisacodyl, HYDROmorphone (DILAUDID) injection, magnesium hydroxide, ondansetron, oxyCODONE, sodium chloride   Assessment/ Plan:    Patient is a 62 y.o. 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   Acute on chronic kidney disease: Pt likely with baseline kidney disease 2/2 long standing DM and HTN, and renal u/s consistent with this as well. Elevated protein/Cr ratio. Worsening  acute injury possibly from Vancomycin use in setting of CKD. Also with significant fluid retention with JVD, liver distension, edema in his posterior thighs and flank, and CXR consistent with pulmonary vasc congestion. Lasix 60mg  IV q6h started 7/5; Cr stable at 2.22, and pt remains non-oliguric and has good uop 3530cc. Vancomycin switched to Daptomycin. Acid/base/K stable. Potassium stable.  Cannot r/o pre-renal disorder, possibilities include RAS vs intrinsic renal disease, such as multiple myeloma.  - Checking SPEP, UPEP, and kappa/lamda light chains  - MRA to evaluate renal arteries for RAS on Monday - Continue diureses with Lasix IV 60mg  q8h  - Strict I&Os  - Daily weights  - AM BMP   Anemia: Hgb 6.5 this admission. Transfused 3u pRBCs this admission. No active bleeding, noted, but hemoccult not done. Hgb stable at 7.9 today. Likely anemia of chronic disease but cannot r/o GI bleed. Keep Hgb>8 given CAD history.  - Check hemoccult  - Am CBC   Chronic venous and arterial insufficiency: Osteomyelitis in right foot; now POD #3 s/p R BKA. LLE with numerous small ulcerations on the anterior surface and a large ulceration on the posterior  surface. Dressings per WOC, and pt on Vancomycin/Zosyn on admission, Vanc changed to Daptomycin 7/5.  - Zosyn/Daptomycin   HTN: Stable. On Lopressor and hydralazine. Will need to monitor on Lasix; may need to decrease hydralazine if BP declines.   DM: Stable. A1c 5.9 this admission. Previously uncontrolled per pt and family, A1c 10.9 07/28/12. Family attributes improved control to the fact that he has been in a SNF and has been receiving insulin since June.   DVT PPx: Heparin  Length of Stay: 4 days  Patient history and plan of care reviewed with attending, Dr. Jonnie Finner.   Otho Bellows, MD  PGYII, Internal Medicine Resident 09/10/2012, 8:45 AM  Patient seen and examined.  Agree with assessment and plan as above. Kelly Splinter  MD Pager [phone]    Cell  [phone] 09/19/2012, 3:11 PM

## 2012-09-10 NOTE — Progress Notes (Signed)
PATIENT ID: [name]   4 Days Post-Op Procedure(s) (LRB): AMPUTATION BELOW KNEE (Right)  Subjective:Patient is feeling well this am with regards to R LE and L LE. Reports pain is well controlled with current pain mgmt. No other complaints or concerns.    Objective:  Filed Vitals:   09/10/12 0630  BP: 141/66  Pulse: 86  Temp: 98.2 F (36.8 C)  Resp: 18     Awake, alert, oritentated  R LE with mild swelling, dressing in place  Surrounding skin with no swelling, erythema, warmth  L LE wound with clean, dry and intact dressing, able to dorsiflex and plantarflex without pain, intact sensation to light touch on feet and toes   Labs:   Recent Labs  09/07/12 1601 09/08/12 0500 09/09/12 0605 09/10/12 0430  HGB 8.6* 8.1* 7.9* 7.9*   Recent Labs  09/09/12 0605 09/10/12 0430  WBC 10.8* 10.8*  RBC 2.90* 2.91*  HCT 23.9* 24.1*  PLT 336 343   Recent Labs  09/09/12 0605 09/10/12 0430  NA 139 138  K 4.6 4.3  CL 107 107  CO2 21 25  BUN 30* 33*  CREATININE 2.25* 2.22*  GLUCOSE 72 87  CALCIUM 7.9* 8.0*    Assessment and Plan: 4 days s/p right BKA  Pain well controlled, continue current pain mgmt  ABLA- expected postoperatively, trending downward, s/p transfusion 3 units packed RBC, mgmt is complex with AKI, mgmt per primary team  Left lower extremity wound evaluated by [facility] nurse, Louretta Parma boot with weekly changes, no changes/concerns today  D/C to SNF when medically stable per primary team, greatly appreciate their care of this complex patient   VTE proph: per primary team

## 2012-09-11 LAB — BASIC METABOLIC PANEL
BUN: 31 mg/dL — ABNORMAL HIGH (ref 6–23)
CO2: 26 mEq/L (ref 19–32)
Calcium: 8 mg/dL — ABNORMAL LOW (ref 8.4–10.5)
GFR calc non Af Amer: 30 mL/min — ABNORMAL LOW (ref >90)
Glucose, Bld: 95 mg/dL (ref 70–99)

## 2012-09-11 LAB — CBC
HCT: 24.2 % — ABNORMAL LOW (ref 39.0–52.0)
Hemoglobin: 8.1 g/dL — ABNORMAL LOW (ref 13.0–17.0)
MCH: 27.3 pg (ref 26.0–34.0)
MCHC: 33.5 g/dL (ref 30.0–36.0)

## 2012-09-11 LAB — KAPPA/LAMBDA LIGHT CHAINS: Kappa, lambda light chain ratio: 1.57 (ref 0.26–1.65)

## 2012-09-11 LAB — GLUCOSE, CAPILLARY: Glucose-Capillary: 141 mg/dL — ABNORMAL HIGH (ref 70–99)

## 2012-09-11 MED ORDER — FUROSEMIDE 10 MG/ML IJ SOLN: 60.0000 mg | Freq: Two times a day (BID) | INTRAMUSCULAR | Status: DC

## 2012-09-11 MED ORDER — ASPIRIN 325 MG PO TABS
325.0000 mg | ORAL_TABLET | Freq: Two times a day (BID) | ORAL | Status: DC
  Administered 2012-09-11: 325 mg via ORAL
  Filled 2012-09-11 (×2): qty 1

## 2012-09-11 MED ORDER — ASPIRIN 325 MG PO TABS: 325.0000 mg | ORAL_TABLET | Freq: Two times a day (BID) | ORAL | Status: DC

## 2012-09-11 MED ORDER — HEPARIN SOD (PORK) LOCK FLUSH 100 UNIT/ML IV SOLN
250.0000 [IU] | INTRAVENOUS | Status: AC | PRN
  Administered 2012-09-11: 16:00:00

## 2012-09-11 NOTE — Progress Notes (Signed)
UR COMPLETED  

## 2012-09-11 NOTE — Progress Notes (Addendum)
[name] - PROGRESS NOTE Resident Note   Please see below for attending addendum to resident note.  Subjective:   Afebrile. No acute events. Pt w/o complaints this morning; states that he is feeling much better today.   Objective:    Vital Signs:   Temp:  [98.1 F (36.7 C)-99 F (37.2 C)] 99 F (37.2 C) (07/07 0542) Pulse Rate:  [73-86] 81 (07/07 0542) Resp:  [16-18] 16 (07/07 0542) BP: (130-146)/(55-69) 132/55 mmHg (07/07 0542) SpO2:  [95 %-100 %] 95 % (07/07 0542) Weight:  [187 lb 12.8 oz (85.186 kg)-190 lb 14.4 oz (86.592 kg)] 190 lb 14.4 oz (86.592 kg) (07/07 0542) Last BM Date: 09/10/12  24-hour weight change: Weight change:   Weight trends: Filed Weights   09/06/12 1545 09/11/12 0500 09/11/12 0542  Weight: 195 lb 15.8 oz (88.9 kg) 187 lb 12.8 oz (85.186 kg) 190 lb 14.4 oz (86.592 kg)    Intake/Output:  07/06 0701 - 07/07 0700 In: 480 [P.O.:480] Out: 2950 [Urine:2950]  Physical Exam: General: Vital signs reviewed and noted. Well-developed, well-nourished, in no acute distress; alert, appropriate and cooperative throughout examination.  Neck: No deformities, masses, or tenderness noted.Supple, Left carotid bruit, +JVD to the jaw line.   Lungs:  Normal respiratory effort. Clear to auscultation BL without crackles or wheezes.  Heart: RRR. S1 and S2 normal without gallop, murmur, or rubs.  Abdomen:  BS normoactive. Soft, Nondistended, non-tender.  No masses. Liver palpable 2-3cm.  Extremities: 2+ pitting edema of posterior thighs, buttocks- improving     Labs: Basic Metabolic Panel:  Recent Labs Lab 09/09/12 0605 09/10/12 0430 09/11/12 0600  NA 139 138 137  K 4.6 4.3 3.9  CL 107 107 104  CO2 21 25 26   GLUCOSE 72 87 95  BUN 30* 33* 31*  CREATININE 2.25* 2.22* 2.22*  CALCIUM 7.9* 8.0* 8.0*    Liver Function Tests: No results found for this basename: AST, ALT, ALKPHOS, BILITOT, PROT, ALBUMIN,  in the last 168 hours No results found  for this basename: LIPASE, AMYLASE,  in the last 168 hours No results found for this basename: AMMONIA,  in the last 168 hours  CBC:  Recent Labs Lab 09/06/12 0951  09/09/12 0605 09/10/12 0430 09/11/12 0600  WBC 14.1*  < > 10.8* 10.8* 10.2  NEUTROABS 10.7*  --   --   --   --   HGB 6.5*  < > 7.9* 7.9* 8.1*  HCT 20.0*  < > 23.9* 24.1* 24.2*  MCV 81.3  < > 82.4 82.8 81.5  PLT 416*  < > 336 343 346  < > = values in this interval not displayed.  Cardiac Enzymes:  Recent Labs Lab 09/10/12 0430  CKTOTAL 35    BNP: No results found for this basename: PROBNP,  in the last 168 hours  CBG:  Recent Labs Lab 09/10/12 0805 09/10/12 1209 09/10/12 1618 09/10/12 2244 09/11/12 0614  GLUCAP 101* 140* 128* 198* 106*    Microbiology: Results for orders placed during the hospital encounter of 09/06/12  URINE CULTURE     Status: None   Collection Time    09/07/12  1:22 PM      Result Value Range Status   Specimen Description URINE, RANDOM   Final   Special Requests NONE   Final   Culture  Setup Time 09/07/2012 16:58   Final   Colony Count 9,000 COLONIES/ML   Final   Culture INSIGNIFICANT GROWTH   Final   Report  Status 09/08/2012 FINAL   Final    Coagulation Studies: No results found for this basename: LABPROT, INR,  in the last 72 hours  Urinalysis:    Component Value Date/Time   COLORURINE YELLOW 09/07/2012 1322   APPEARANCEUR CLOUDY* 09/07/2012 1322   LABSPEC 1.013 09/07/2012 1322   PHURINE 5.0 09/07/2012 1322   GLUCOSEU NEGATIVE 09/07/2012 1322   HGBUR NEGATIVE 09/07/2012 1322   BILIRUBINUR NEGATIVE 09/07/2012 1322   KETONESUR NEGATIVE 09/07/2012 1322   PROTEINUR 30* 09/07/2012 1322   UROBILINOGEN 1.0 09/07/2012 1322   NITRITE NEGATIVE 09/07/2012 1322   LEUKOCYTESUR SMALL* 09/07/2012 1322     Imaging: Dg Abd Acute W/chest  09/09/2012   *RADIOLOGY REPORT*  Clinical Data: Vomiting.  Nausea.  ACUTE ABDOMEN SERIES (ABDOMEN 2 VIEW & CHEST 1 VIEW)  Comparison: Multiple exams, including  09/07/2012 and 08/09/2012  Findings: Right-sided PICC line tip:  The upper SVC.  Prior CABG noted with borderline cardiomegaly (cardiothoracic index 55%).  Indistinct pulmonary vasculature noted compatible with pulmonary venous hypertension.  No overt edema.  No pleural effusion identified.  No free intraperitoneal gas noted within the abdomen.  No significant abnormal air-fluid levels or dilated bowel noted. Lumbar spondylosis and levoconvex scoliosis noted.  IMPRESSION:  1.  Unremarkable bowel gas pattern. 2.  Borderline cardiomegaly with pulmonary venous hypertension.   Original Report Authenticated By: Van Clines, M.D.      Medications:    Infusions:    Scheduled Medications: . bisacodyl  10 mg Rectal Daily  . DAPTOmycin (CUBICIN)  IV  350 mg Intravenous Q24H  . docusate sodium  100 mg Oral BID  . furosemide  60 mg Intravenous Q8H  . heparin subcutaneous  5,000 Units Subcutaneous Q8H  . hydrALAZINE  50 mg Oral Q8H  . insulin aspart  0-15 Units Subcutaneous TID WC  . insulin detemir  8 Units Subcutaneous QHS  . metoCLOPramide  5 mg Oral TID AC  . metoprolol tartrate  25 mg Oral BID  . pantoprazole  40 mg Oral BID  . piperacillin-tazobactam  3.375 g Intravenous Q8H  . polyethylene glycol  17 g Oral BID    PRN Medications: acetaminophen, bisacodyl, HYDROmorphone (DILAUDID) injection, magnesium hydroxide, ondansetron, oxyCODONE, sodium chloride   Assessment/ Plan:    Patient is a 62 y.o. 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   Acute on chronic kidney disease: Pt likely with baseline kidney disease 2/2 long standing DM and HTN, and renal u/s consistent with this as well. Elevated  protein/Cr ratio. Worsening acute injury possibly from Vancomycin use and poor po intake in setting of CKD. Also with significant fluid retention with JVD, liver distension, edema in his posterior thighs and flank, and CXR consistent with pulmonary vasc congestion. Vancomycin switched to Daptomycin. Lasix 60mg  IV q6h started 7/5; good uop and Cr stable at 2.22. Decreased Lasix to q6, and pt remains non-oliguric and Cr is stable.  Acid/base/K stable. Cannot r/o pre-renal disorder, possibilities include RAS vs intrinsic renal disease, such as multiple myeloma.  - Checking SPEP, UPEP, and kappa/lamda light chains  - MRA to evaluate renal arteries for RAS  - Decrease Lasix IV 60mg  BID  - Strict I&Os  -  Daily weights  - AM BMP   Anemia: Hgb 6.5 this admission. Transfused 3u pRBCs this admission. No active bleeding, noted, but hemoccult not done. Hgb stable at 8.1 today. Likely anemia of chronic disease but cannot r/o GI bleed. Keep Hgb>8 given CAD history.  - Check hemoccult  - Am CBC   Chronic venous and arterial insufficiency: Osteomyelitis in right foot; now POD #3 s/p R BKA. LLE with numerous small ulcerations on the anterior surface and a large ulceration on the posterior surface. Dressings per WOC, and pt on Vancomycin/Zosyn on admission, Vanc changed to Daptomycin 7/5.  - Zosyn/Daptomycin   HTN: Stable. On Lopressor and hydralazine. Will need to monitor on Lasix; may need to decrease hydralazine if BP declines.   DM: Stable. A1c 5.9 this admission. Previously uncontrolled per pt and family, A1c 10.9 07/28/12. Family attributes improved control to the fact that he has been in a SNF and has been receiving insulin since June.   DVT PPx: Heparin  Length of Stay: 5 days  Patient history and plan of care reviewed with attending, Dr. Posey Pronto.   Otho Bellows, MD  PGYII, Internal Medicine Resident 09/11/2012, 8:43 AM

## 2012-09-11 NOTE — Progress Notes (Signed)
[facility]                                                                                Patient Demographics  [name], is a 62 y.o. male, DOB - 08-09-1950, [id number], [id number]  Admit date - 09/06/2012  Admitting Physician Kerin Salen, MD  Outpatient Primary MD for the patient is No PCP Per Patient  LOS - 5   No chief complaint on file.       Assessment & Plan    Acute kidney injury   - Likely worsened due to anemia, question underlying chronic kidney disease  - Renal function has improved some, he status post 3 units packed RBC transfusion and IV fluids which are ongoing, however still not close to baseline, renal ultrasound is stable however despite generous IV fluids and packed RBC transfusion his renal function has not improved, renal has been consulted appreciate their input. Renal has ordered  them are a of the renal arteries which is pending, will defer followup on that to renal. Note his vancomycin has been switched to daptomycin by renal. This was done on 09/09/2012.     Constipation x3 days with nausea vomiting in the morning of 09/09/2012.  KUB stable, all resolved after bowel regimen and bowel movements. Not nauseated now.     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 Mayer Camel orthopedic surgeon on 09/07/2012, he is doubtful that patient will be aable to salvage his left leg. as that also looks infected, for now empiric antibiotics to be continued.    Type II or unspecified type diabetes mellitus    A.m. Sugars running low, discontinued nighttime sliding scale and reduced Levemir dose.   Lab Results  Component Value Date   HGBA1C 5.9* 09/06/2012    CBG (last 3)   Recent Labs  09/10/12 1618 09/10/12 2244 09/11/12 0614  GLUCAP 128* 198* 106*      Anemia: Likely anemia of chronic disease worsened by perioperative blood loss and  dilution with IV fluids, He status post 3 units of packed RBC transfusion, his anemia panel is stable, he is Hemoccult negative x2. Monitor H&H on intermittent basis.     H/O: CVA (cerebrovascular accident), CAD CABG Continue aspirin along with beta blocker. He is chest pain-free, try to keep hemoglobin above 8.     Hypertension: Continue metoprolol, hydralazine       Code Status: Full  Family Communication: None present  Disposition Plan: Per orthopedics   Procedures R. BKA done by primary team, renal ultrasound ordered   DVT Prophylaxis   Heparin   Lab Results  Component Value Date   PLT 346 09/11/2012    Medications  Scheduled Meds: . bisacodyl  10 mg Rectal Daily  . DAPTOmycin (CUBICIN)  IV  350 mg Intravenous Q24H  . docusate sodium  100 mg Oral BID  . furosemide  60 mg Intravenous Q8H  . heparin subcutaneous  5,000 Units Subcutaneous Q8H  . hydrALAZINE  50 mg Oral Q8H  . insulin aspart  0-15 Units Subcutaneous TID WC  . insulin detemir  8 Units Subcutaneous QHS  . metoCLOPramide  5 mg Oral TID AC  . metoprolol tartrate  25 mg Oral BID  . pantoprazole  40 mg Oral BID  . piperacillin-tazobactam  3.375 g Intravenous Q8H  . polyethylene glycol  17 g Oral BID   Continuous Infusions:   PRN Meds:.acetaminophen, bisacodyl, HYDROmorphone (DILAUDID) injection, magnesium hydroxide, ondansetron, oxyCODONE, sodium chloride  Antibiotics    Anti-infectives   Start     Dose/Rate Route Frequency Ordered Stop   09/09/12 1700  DAPTOmycin (CUBICIN) 350 mg in sodium chloride 0.9 % IVPB     350 mg 214 mL/hr over 30 Minutes Intravenous Every 24 hours 09/09/12 1530     09/07/12 0600  vancomycin (VANCOCIN) 1,500 mg in sodium chloride 0.9 % 500 mL IVPB  Status:  Discontinued     1,500 mg 250 mL/hr over 120 Minutes Intravenous Every 24 hours 09/06/12 2023 09/09/12 1449   09/06/12 1645  piperacillin-tazobactam (ZOSYN) IVPB 3.375 g    Comments:  For necrotic right lower  extremity.   3.375 g 12.5 mL/hr over 240 Minutes Intravenous 3 times per day 09/06/12 1613     09/06/12 1615  vancomycin (VANCOCIN) 1,500 mg in sodium chloride 0.9 % 500 mL IVPB  Status:  Discontinued    Comments:  For necrotic right lower extremity.   1,500 mg 250 mL/hr over 120 Minutes Intravenous Every 24 hours 09/06/12 1613 09/06/12 1625   09/06/12 1124  ceFAZolin (ANCEF) injection  Status:  Discontinued       As needed 09/06/12 1125 09/06/12 1313   09/06/12 1103  ceFAZolin (ANCEF) 2-3 GM-% IVPB SOLR    Comments:  Ara Kussmaul: cabinet override      09/06/12 1103 09/06/12 2314   09/06/12 0600  ceFAZolin (ANCEF) IVPB 2 g/50 mL premix     2 g 100 mL/hr over 30 Minutes Intravenous On call to O.R. 09/05/12 1210 09/06/12 1149       Time Spent in minutes   40   Lala Lund K M.D on 09/11/2012 at 8:38 AM  Between 7am to 7pm - Pager - [phone]  After 7pm go to [email or web address] - password [email or web address]  And look for the night coverage person covering for me after hours  [facility] Office  [phone]    Subjective:   Jacolby Brow today has, No headache, No chest pain, No abdominal pain  No new weakness tingling or numbness, No Cough - SOB. No nausea or vomiting he feels better.  Objective:   Filed Vitals:   09/10/12 1211 09/10/12 2129 09/11/12 0500 09/11/12 0542  BP: 146/69 130/57  132/55  Pulse: 86 73  81  Temp: 98.1 F (36.7 C) 98.9 F (37.2 C)  99 F (37.2 C)  TempSrc:  Oral  Oral  Resp: 18 16  16   Height:      Weight:   85.186 kg (187 lb 12.8 oz) 86.592 kg (190 lb 14.4 oz)  SpO2: 100% 98%  95%    Wt Readings from Last 3 Encounters:  09/11/12 86.592 kg (190 lb 14.4 oz)  09/11/12 86.592 kg (190 lb 14.4 oz)  07/31/12 88.9 kg (195 lb 15.8 oz)     Intake/Output Summary (Last 24 hours) at 09/11/12 0838 Last data filed at 09/11/12 0616  Gross per 24 hour  Intake    480 ml  Output   2950 ml  Net  -2470 ml    Exam Awake Alert, Oriented X 3, No  new F.N deficits, Normal affect Bryant.AT,PERRAL  Supple Neck,No JVD, No cervical lymphadenopathy appriciated.  Symmetrical Chest wall movement, Good air movement bilaterally, CTAB RRR,No Gallops,Rubs or new Murmurs, No Parasternal Heave +ve B.Sounds, Abd Soft, Non tender, No organomegaly appriciated, No rebound - guarding or rigidity. R. BKA, left leg and foot also in bandage upto his calf.   Data Review   Micro Results Recent Results (from the past 240 hour(s))  URINE CULTURE     Status: None   Collection Time    09/07/12  1:22 PM      Result Value Range Status   Specimen Description URINE, RANDOM   Final   Special Requests NONE   Final   Culture  Setup Time 09/07/2012 16:58   Final   Colony Count 9,000 COLONIES/ML   Final   Culture INSIGNIFICANT GROWTH   Final   Report Status 09/08/2012 FINAL   Final    Radiology Reports Dg Abd 1 View  08/09/2012   *RADIOLOGY REPORT*  Clinical Data: Abdominal pain and vomiting.  ABDOMEN - 1 VIEW  Comparison: None.  Findings: Bowel gas pattern is unremarkable.  No abnormal abdominal calcification is seen.  There is some enthesopathic change about the pelvis.  IMPRESSION: No acute abnormality.   Original Report Authenticated By: Orlean Patten, M.D.   Dg Chest Port 1 View  09/07/2012   *RADIOLOGY REPORT*  Clinical Data: Fever  PORTABLE CHEST - 1 VIEW  Comparison: 08/09/2012  Findings: There is been previous median sternotomy and CABG.  Right arm PICC has been withdrawn somewhat, the tip now in the SVC just above the azygos level.  There is worsened pulmonary density, right more than left, probably representing interstitial edema.  One could not rule out developing widespread pneumonia.  No effusions. Bony structures are unremarkable.  IMPRESSION: Some withdrawal of the right arm PICC with the tip now in the SVC just above the azygos level.  Worsened pulmonary density, more notable on the right than the left.  This is most consistent with developing  pulmonary edema, though pneumonia is possible as well.   Original Report Authenticated By: Nelson Chimes, M.D.   Dg Chest Port 1 View  08/09/2012   *RADIOLOGY REPORT*  Clinical Data: PICC placement.  PORTABLE CHEST - 1 VIEW  Comparison: PA and lateral chest 08/04/2012.  Findings: Right PICC is in place with the tip projecting over the lower superior vena cava.  Lungs are clear.  Heart size is upper normal.  No pneumothorax or pleural fluid.  IMPRESSION: Tip of right PICC projects over the lower superior vena cava.  No acute abnormality.   Original Report Authenticated By: Orlean Patten, M.D.    [facility] 09/06/12 [phone]  09/07/12 0600 09/07/12 1601 09/08/12 0500 09/09/12 0605 09/10/12 0430 09/11/12 0600  WBC 14.1*  --  14.2*  --  11.7* 10.8* 10.8* 10.2  HGB 6.5*  < > 7.7* 8.6* 8.1* 7.9* 7.9* 8.1*  HCT 20.0*  < > 23.1* 25.4* 24.4* 23.9* 24.1* 24.2*  PLT 416*  --  383  --  312 336 343 346  MCV 81.3  --  81.3  --  82.4 82.4 82.8 81.5  MCH 26.4  --  27.1  --  27.4 27.2 27.1 27.3  MCHC 32.5  --  33.3  --  33.2 33.1 32.8 33.5  RDW 15.4  --  15.4  --  15.8* 15.7* 15.7* 15.7*  LYMPHSABS 1.8  --   --   --   --   --   --   --  MONOABS 1.5*  --   --   --   --   --   --   --   EOSABS 0.0  --   --   --   --   --   --   --   BASOSABS 0.0  --   --   --   --   --   --   --   < > = values in this interval not displayed.  Chemistries   Recent Labs Lab 09/07/12 1045 09/08/12 0500 09/09/12 0605 09/10/12 0430 09/11/12 0600  NA 136 137 139 138 137  K 4.6 4.5 4.6 4.3 3.9  CL 105 107 107 107 104  CO2 22 22 21 25 26   GLUCOSE 117* 85 72 87 95  BUN 35* 32* 30* 33* 31*  CREATININE 2.08* 2.14* 2.25* 2.22* 2.22*  CALCIUM 8.0* 7.7* 7.9* 8.0* 8.0*   ------------------------------------------------------------------------------------------------------------------ estimated creatinine clearance is 36.6 ml/min (by C-G formula based on Cr of  2.22). ------------------------------------------------------------------------------------------------------------------ No results found for this basename: HGBA1C,  in the last 72 hours ------------------------------------------------------------------------------------------------------------------ No results found for this basename: CHOL, HDL, LDLCALC, TRIG, CHOLHDL, LDLDIRECT,  in the last 72 hours ------------------------------------------------------------------------------------------------------------------ No results found for this basename: TSH, T4TOTAL, FREET3, T3FREE, THYROIDAB,  in the last 72 hours ------------------------------------------------------------------------------------------------------------------ No results found for this basename: VITAMINB12, FOLATE, FERRITIN, TIBC, IRON, RETICCTPCT,  in the last 72 hours  Coagulation profile  Recent Labs Lab 09/06/12 0951  INR 1.32    No results found for this basename: DDIMER,  in the last 72 hours  Cardiac Enzymes No results found for this basename: CK, CKMB, TROPONINI, MYOGLOBIN,  in the last 168 hours ------------------------------------------------------------------------------------------------------------------ No components found with this basename: POCBNP,

## 2012-09-11 NOTE — Progress Notes (Signed)
Pt discharged to [facility], SNF. Report called to RN at [facility] [place]. Pts PICC line was heparin locked prior to discharge. Pt left unit in a stable condition via EMS.

## 2012-09-11 NOTE — Progress Notes (Signed)
Orthopedic Tech Progress Note Patient Details:  [name] Jul 31, 1950 [id number]  Ortho Devices Type of Ortho Device: Haematologist Ortho Device/Splint Location: LLE Ortho Device/Splint Interventions: Ordered;Application   Braulio Bosch 09/11/2012, 6:24 PM

## 2012-09-11 NOTE — Progress Notes (Signed)
S: Awake, alert, oriented.  Minimal pain lower extremities.  No N/V.  Tolerating PO intake well.  Passing flatus.    O:  Bilateral lower extremity dressings clean and dry.  Right leg incision healing well.  No drainage or evidence of infection.  Both dressings changed.  A:  #1 S/p right BKA #2 Multiple medical problems managed by [facility]  P: Continue left lower extremity dressing changes - wound nurse following.  Discharge to SNF when bed available and medically stable.  Follow up with Dr. Mayer Camel 2 weeks post-op

## 2012-09-11 NOTE — Progress Notes (Signed)
I have personally seen and examined this patient and agree with the assessment/plan as outlined above by Eulas Post MD (PGY2). Renal function stable (suspect plateau before recovery) with good UOP in response to lasix. No acute volume/electrolyte concerns and no indications for HD. Will continue to monitor for recovery. Almer Bushey K.,MD 09/11/2012 10:45 AM

## 2012-09-11 NOTE — Discharge Summary (Signed)
Patient ID: [name] MRN: [id number] DOB/AGE: 62-Sep-1952 62 y.o.  Admit date: 09/06/2012 Discharge date: 09/11/2012  Admission Diagnoses:  Principal Problem:   Acute kidney injury Active Problems:   H/O: CVA (cerebrovascular accident)   Hypertension   Anemia   Type II or unspecified type diabetes mellitus    Foot ulcer, right   Discharge Diagnoses:  Same  Past Medical History  Diagnosis Date  . Hypertension   . Diabetes mellitus without complication   . 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 Myocardial infarction   . Depression   . Shortness of breath     with activy  . Hx of seasonal allergies   . GERD (gastroesophageal reflux disease)   . Cataract     Surgeries: Procedure(s): AMPUTATION BELOW KNEE on 09/06/2012   Consultants: Treatment Team:  Thomas Ends, MD Thomas Blazing, MD  Discharged Condition: Improved  Hospital Course: [name] is an 62 y.o. male who was admitted 09/06/2012 for operative treatment ofAcute kidney injury. Patient has severe unremitting pain that affects sleep, daily activities, and work/hobbies. After pre-op clearance the patient was taken to the operating room on 09/06/2012 and underwent  Procedure(s): [place].    Patient was given perioperative antibiotics: Anti-infectives   Start     Dose/Rate Route Frequency Ordered Stop   09/09/12 1700  DAPTOmycin (CUBICIN) 350 mg in sodium chloride 0.9 % IVPB     350 mg 214 mL/hr over 30 Minutes Intravenous Every 24 hours 09/09/12 1530     09/07/12 0600  vancomycin (VANCOCIN) 1,500 mg in sodium chloride 0.9 % 500 mL IVPB  Status:  Discontinued     1,500 mg 250 mL/hr over 120 Minutes Intravenous Every 24 hours 09/06/12 2023 09/09/12 1449   09/06/12 1645  piperacillin-tazobactam (ZOSYN) IVPB 3.375 g    Comments:  For necrotic right lower extremity.   3.375 g 12.5 mL/hr over 240 Minutes Intravenous 3 times per day 09/06/12 1613     09/06/12 1615  vancomycin (VANCOCIN) 1,500 mg in sodium  chloride 0.9 % 500 mL IVPB  Status:  Discontinued    Comments:  For necrotic right lower extremity.   1,500 mg 250 mL/hr over 120 Minutes Intravenous Every 24 hours 09/06/12 1613 09/06/12 1625   09/06/12 1124  ceFAZolin (ANCEF) injection  Status:  Discontinued       As needed 09/06/12 1125 09/06/12 1313   09/06/12 1103  ceFAZolin (ANCEF) 2-3 GM-% IVPB SOLR    Comments:  [name]: cabinet override      09/06/12 1103 09/06/12 2314   09/06/12 0600  ceFAZolin (ANCEF) IVPB 2 g/50 mL premix     2 g 100 mL/hr over 30 Minutes Intravenous On call to O.R. 09/05/12 1210 09/06/12 1149       Patient was given sequential compression devices, early ambulation, and chemoprophylaxis to prevent DVT.  Patient benefited maximally from hospital stay and there were no complications.    Recent vital signs: Patient Vitals for the past 24 hrs:  BP Temp Temp src Pulse Resp SpO2 Weight  09/11/12 0542 132/55 mmHg 99 F (37.2 C) Oral 81 16 95 % 86.592 kg (190 lb 14.4 oz)  09/11/12 0500 - - - - - - 85.186 kg (187 lb 12.8 oz)  09/10/12 2129 130/57 mmHg 98.9 F (37.2 C) Oral 73 16 98 % -  09/10/12 1211 146/69 mmHg 98.1 F (36.7 C) - 86 18 100 % -     Recent laboratory studies:  Recent Labs  09/10/12 0430 09/11/12 0600  WBC 10.8* 10.2  HGB 7.9* 8.1*  HCT 24.1* 24.2*  PLT 343 346  NA 138 137  K 4.3 3.9  CL 107 104  CO2 25 26  BUN 33* 31*  CREATININE 2.22* 2.22*  GLUCOSE 87 95  CALCIUM 8.0* 8.0*     Discharge Medications:     Medication List    STOP taking these medications       aspirin 81 MG EC tablet      TAKE these medications       acetaminophen 325 MG tablet  Commonly known as:  TYLENOL  Take 2 tablets (650 mg total) by mouth every 6 (six) hours as needed.     hydrALAZINE 50 MG tablet  Commonly known as:  APRESOLINE  Take 1 tablet (50 mg total) by mouth every 8 (eight) hours.     insulin aspart 100 UNIT/ML injection  Commonly known as:  novoLOG  Inject 0-9 Units into  the skin 3 (three) times daily with meals. CBG 70 - 120: 0 units CBG 121 - 150: 1 unit,  CBG 151 - 200: 2 units,  CBG 201 - 250: 3 units,  CBG 251 - 300: 5 units,  CBG 301 - 350: 7 units,  CBG 351 - 400: 9 units   CBG > 400: 9 units and notify your MD     insulin aspart 100 UNIT/ML injection  Commonly known as:  novoLOG  Inject 4 Units into the skin 3 (three) times daily with meals.     insulin detemir 100 UNIT/ML injection  Commonly known as:  LEVEMIR  Inject 0.15 mLs (15 Units total) into the skin at bedtime.     metoCLOPramide 5 MG tablet  Commonly known as:  REGLAN  Take 1 tablet (5 mg total) by mouth 3 (three) times daily before meals.     metoprolol tartrate 25 MG tablet  Commonly known as:  LOPRESSOR  Take 1 tablet (25 mg total) by mouth 2 (two) times daily.     ondansetron 4 MG tablet  Commonly known as:  ZOFRAN  Take 1 tablet (4 mg total) by mouth every 6 (six) hours as needed for nausea.     oxyCODONE 5 MG immediate release tablet  Commonly known as:  Oxy IR/ROXICODONE  Take 1-2 tablets (5-10 mg total) by mouth every 4 (four) hours as needed.     pantoprazole 40 MG tablet  Commonly known as:  PROTONIX  Take 1 tablet (40 mg total) by mouth daily at 6 (six) AM.     piperacillin-tazobactam 3.375 GM/50ML IVPB  Commonly known as:  ZOSYN  Inject 50 mLs (3.375 g total) into the vein every 8 (eight) hours. Stop on July10th     polyethylene glycol packet  Commonly known as:  MIRALAX / GLYCOLAX  Take 17 g by mouth daily.     sodium chloride 0.9 % injection  10-40 mLs by Intracatheter route as needed (flush).     sodium chloride 0.9 % SOLN 500 mL with vancomycin 10 G SOLR 1,500 mg  Inject 1,500 mg into the vein daily. Stop date on July 10th        Diagnostic Studies: Mr [name] Abdomen Wo Contrast  09/11/2012   *RADIOLOGY REPORT*  Clinical Data:  Renal insufficiency and hypertension.  History of diabetes and coronary artery disease.  MRA ABDOMEN WITHOUT CONTRAST  Technique:   Angiographic images of the abdomen were obtained using MRA technique without intravenous  contrast.  Comparison:   None.  Findings:  Unenhanced sequences were utilized for evaluation of the mid abdominal aorta and renal arteries due to underlying renal insufficiency.  The renal arteries are well visualized.  A single left renal artery shows normal patency.  A single right renal artery does show evidence of proximal disease with stenosis identified.  This does not appear critical in severity but does likely approach moderate degree of narrowing of roughly 50 - 70%. Origins of the celiac axis, superior mesenteric artery and inferior mesenteric artery are normally patent.  The visualized segment of the abdominal aorta shows no aneurysmal disease.  No other incidental findings are identified on the limited acquisition.  There does not appear to be any evidence of hydronephrosis.  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  The left renal artery is normally patent.   Original Report Authenticated By: Aletta Edouard, M.D.   US Renal  09/08/2012   *RADIOLOGY REPORT*  Clinical Data:  Acute renal failure  RENAL/URINARY TRACT ULTRASOUND COMPLETE  Comparison:  None.  Findings:  Right Kidney:  Normal in size at 12.5 cm.  The renal parenchyma is diffusely echogenic.  No evidence of mass or hydronephrosis.  Left Kidney:  Normal in size at 12.3 cm.  The renal parenchyma is diffusely echogenic.  No evidence of mass or hydronephrosis.  Bladder:  Appears normal for degree of bladder distention.  Other:  Bilateral pleural effusions. Sludge and small stones layer within the gallbladder lumen.  IMPRESSION:  1.  Negative for hydronephrosis. 2.  Echogenic kidneys bilaterally consistent with underlying medical renal disease. 3.  Small bilateral pleural effusions incidentally noted. 4.  Gallbladder sludge and / or small stones.   Original Report Authenticated By: Jacqulynn Cadet, M.D.   Dg Chest Port 1 View  09/07/2012   *RADIOLOGY REPORT*  Clinical Data: Fever  PORTABLE CHEST - 1 VIEW  Comparison: 08/09/2012  Findings: There is been previous median sternotomy and CABG.  Right arm PICC has been withdrawn somewhat, the tip now in the SVC just above the azygos level.  There is worsened pulmonary density, right more than left, probably representing interstitial edema.  One could not rule out developing widespread pneumonia.  No effusions. Bony structures are unremarkable.  IMPRESSION: Some withdrawal of the right arm PICC with the tip now in the SVC just above the azygos level.  Worsened pulmonary density, more notable on the right than the left.  This is most consistent with developing pulmonary edema, though pneumonia is possible as well.   Original Report Authenticated By: Nelson Chimes, M.D.   Dg Abd Acute W/chest  09/09/2012   *RADIOLOGY REPORT*  Clinical Data: Vomiting.  Nausea.  ACUTE ABDOMEN SERIES (ABDOMEN 2 VIEW & CHEST 1 VIEW)  Comparison: Multiple exams, including 09/07/2012 and 08/09/2012  Findings: Right-sided PICC line tip:  The upper SVC.  Prior CABG noted with borderline cardiomegaly (cardiothoracic index 55%).  Indistinct pulmonary vasculature noted compatible with pulmonary venous hypertension.  No overt edema.  No pleural effusion identified.  No free intraperitoneal gas noted within the abdomen.  No significant abnormal air-fluid levels or dilated bowel noted. Lumbar spondylosis and levoconvex scoliosis noted.  IMPRESSION:  1.  Unremarkable bowel gas pattern. 2.  Borderline cardiomegaly with pulmonary venous hypertension.   Original Report Authenticated By: Van Clines, M.D.    Disposition: 03-Skilled Nursing Facility      Discharge Orders   Future Orders Complete By Expires  Call MD for:  redness, tenderness, or signs of infection (pain, swelling, redness, odor or green/yellow discharge around incision site)  As directed     Call MD for:   severe uncontrolled pain  As directed     Call MD for:  temperature >100.4  As directed     Discharge instructions  As directed     Comments:      Follow up with Dr. Mayer Camel 14 days post-op    Driving Restrictions  As directed     Comments:      No driving for 2 weeks.    Increase activity slowly  As directed     May shower / Bathe  As directed     Walker   As directed           Signed: Tierany Appleby M. 09/11/2012, 10:44 AM

## 2012-09-12 ENCOUNTER — Non-Acute Institutional Stay (SKILLED_NURSING_FACILITY): Payer: Medicare Other | Admitting: Internal Medicine

## 2012-09-12 DIAGNOSIS — Z89511 Acquired absence of right leg below knee: Secondary | ICD-10-CM

## 2012-09-12 DIAGNOSIS — E1159 Type 2 diabetes mellitus with other circulatory complications: Secondary | ICD-10-CM

## 2012-09-12 DIAGNOSIS — I96 Gangrene, not elsewhere classified: Secondary | ICD-10-CM

## 2012-09-12 DIAGNOSIS — I5022 Chronic systolic (congestive) heart failure: Secondary | ICD-10-CM

## 2012-09-12 DIAGNOSIS — R234 Changes in skin texture: Secondary | ICD-10-CM

## 2012-09-12 DIAGNOSIS — F17200 Nicotine dependence, unspecified, uncomplicated: Secondary | ICD-10-CM

## 2012-09-12 DIAGNOSIS — I10 Essential (primary) hypertension: Secondary | ICD-10-CM

## 2012-09-12 DIAGNOSIS — S88119A Complete traumatic amputation at level between knee and ankle, unspecified lower leg, initial encounter: Secondary | ICD-10-CM

## 2012-09-12 DIAGNOSIS — F331 Major depressive disorder, recurrent, moderate: Secondary | ICD-10-CM

## 2012-09-12 DIAGNOSIS — Z720 Tobacco use: Secondary | ICD-10-CM

## 2012-09-12 LAB — UIFE/LIGHT CHAINS/TP QN, 24-HR UR
Alpha 2, Urine: DETECTED — AB
Beta, Urine: DETECTED — AB
Free Kappa Lt Chains,Ur: 53.9 mg/dL — ABNORMAL HIGH (ref 0.14–2.42)
Free Lambda Lt Chains,Ur: 12.4 mg/dL — ABNORMAL HIGH (ref 0.02–0.67)

## 2012-09-12 LAB — PROTEIN ELECTROPHORESIS, SERUM
Albumin ELP: 29.8 % — ABNORMAL LOW (ref 55.8–66.1)
Beta 2: 8.6 % — ABNORMAL HIGH (ref 3.2–6.5)
Gamma Globulin: 25.6 % — ABNORMAL HIGH (ref 11.1–18.8)

## 2012-09-12 NOTE — Progress Notes (Signed)
Patient ID: [name], male   DOB: Oct 22, 1950, 62 y.o.   MRN: [id number] Provider:  Rexene Edison. Mariea Clonts, D.O., C.M.D. 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 male with h/o DMII with renal and vascular complications, CKD, prior stroke was admitted here for short term rehab s/p bka due to right foot osteomyelitis. Was admitted 09/06/12 due to bilateral foot ulcers with osteomyelitis of the right foot.  There were concerns that the left leg also was unlikely to be salvageable.  He underwent right BKA by Dr. Mayer Camel on 09/10/12.  His stay was complicated by acute kidney injury, constipation and acute blood loss anemia due to his surgery.  He required 3 units PRBCs and IVF hydration.  His hemoccult was negative x 2.   He had hypoglycemia requiring reduction of his levemir and discontinuation of bedtime SSI.  He is on vanc and zosyn through 09/14/12.    Review of Systems:  Review of Systems  Constitutional: Positive for malaise/fatigue. Negative for fever and chills.  Respiratory: Negative for shortness of breath.   Cardiovascular: Negative for chest pain.  Gastrointestinal: Negative for abdominal pain and constipation.  Genitourinary: Negative for dysuria.  Musculoskeletal: Negative for myalgias.  Skin: Negative for rash.       No erythema, warmth, mild drainage from bka site  Neurological: Positive for weakness. Negative for dizziness and headaches.  Psychiatric/Behavioral: Positive for depression.       Alcohol abuse     Past Medical History  Diagnosis Date  . Hypertension   . 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 Myocardial infarction   . Hx of seasonal allergies   . GERD (gastroesophageal reflux disease)   . High cholesterol   . Exertional shortness of breath     "before my last OR; I'm  fine now" (09/26/2012)  . Type II diabetes mellitus    Past Surgical History  Procedure Laterality Date  . 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 Amputation Right 07/29/2012    Procedure: AMPUTATION 3RD TOE;  Surgeon: Kerin Salen, MD;  Location: [place];  Service: Orthopedics;  Laterality: Right;  right third toe amputation  . Amputation Right 08/02/2012    Procedure: right transmetatarsal amputation;  Surgeon: Kerin Salen, MD;  Location: [place];  Service: Orthopedics;  Laterality: Right;  . Cataract extraction w/ intraocular lens  implant, bilateral  ?2010  . Amputation Right 09/06/2012    Procedure: AMPUTATION BELOW KNEE;  Surgeon: Kerin Salen, MD;  Location: [place];  Service: Orthopedics;  Laterality: Right;  . Peripherally inserted central catheter insertion Right 09/2012    upper arm  . Amputation Left 09/27/2012    Procedure: AMPUTATION BELOW KNEE;  Surgeon: Kerin Salen, MD;  Location: [place];  Service: Orthopedics;  Laterality: Left;  . Esophagogastroduodenoscopy N/A 10/08/2012    Procedure: ESOPHAGOGASTRODUODENOSCOPY (EGD);  Surgeon: Winfield Cunas., MD;  Location: [facility];  Service: Endoscopy;  Laterality: N/A;   Social History:   reports that he has quit smoking. His smoking use included Cigarettes. He has a 2.4 pack-year smoking history. He has never used smokeless tobacco. He reports that he does not drink alcohol or use illicit drugs.  No family history on file.  Medications: Patient's Medications  New Prescriptions   ASPIRIN 325 MG TABLET    Take 1 tablet (325 mg total) by mouth 2 (two) times daily.   DARBEPOETIN (ARANESP) 150 MCG/0.3ML SOLN INJECTION    Inject 0.3 mL (150 mcg total) into the skin every Monday at 6 PM.   FLUOXETINE (PROZAC) 40 MG CAPSULE    Take 1 capsule (40 mg total) by mouth daily.   METOPROLOL (LOPRESSOR) 50 MG TABLET    Take 1 tablet (50 mg total) by mouth 2 (two) times daily.  Previous Medications   ACETAMINOPHEN (TYLENOL) 325 MG  TABLET    Take 2 tablets (650 mg total) by mouth every 6 (six) hours as needed.   HYDRALAZINE (APRESOLINE) 50 MG TABLET    Take 1 tablet (50 mg total) by mouth every 8 (eight) hours.   INSULIN ASPART (NOVOLOG) 100 UNIT/ML INJECTION    Inject 4 Units into the skin 3 (three) times daily with meals.   INSULIN DETEMIR (LEVEMIR) 100 UNIT/ML INJECTION    Inject 0.15 mLs (15 Units total) into the skin at bedtime.   METOCLOPRAMIDE (REGLAN) 5 MG TABLET    Take 5 mg by mouth 2 (two) times daily before a meal.   ONDANSETRON (ZOFRAN) 4 MG TABLET    Take 1 tablet (4 mg total) by mouth every 6 (six) hours as needed for nausea.   PANTOPRAZOLE (PROTONIX) 40 MG TABLET    Take 1 tablet (40 mg total) by mouth daily at 6 (six) AM.   POLYETHYLENE GLYCOL (MIRALAX / GLYCOLAX) PACKET    Take 17 g by mouth daily.   PROMETHAZINE (PHENERGAN) 25 MG SUPPOSITORY    Place 25 mg rectally every 6 (six) hours as needed for nausea.  Modified Medications   Modified Medication Previous Medication   OXYCODONE (OXY IR/ROXICODONE) 5 MG IMMEDIATE RELEASE TABLET oxyCODONE (OXY IR/ROXICODONE) 5 MG immediate release tablet      Take 1-2 tablets (5-10 mg total) by mouth every 4 (four) hours as needed.    Take 1-2 tablets (5-10 mg total) by mouth every 4 (four) hours as needed.  Discontinued Medications   ASPIRIN 325 MG TABLET    Take 1 tablet (325 mg total) by mouth 2 (two) times daily.   INSULIN ASPART (NOVOLOG) 100 UNIT/ML INJECTION    Inject 0-9 Units into the skin 3 (three) times daily with meals. CBG 70 - 120: 0 units CBG 121 - 150: 1 unit,  CBG 151 - 200: 2 units,  CBG 201 - 250: 3 units,  CBG 251 - 300: 5 units,  CBG 301 - 350: 7 units,  CBG 351 - 400: 9 units   CBG > 400: 9 units and notify your MD   METOCLOPRAMIDE (REGLAN) 5 MG TABLET    Take 1 tablet (5 mg total) by mouth 3 (three) times daily before meals.   METOPROLOL TARTRATE (LOPRESSOR) 25 MG TABLET    Take 1 tablet (25 mg total) by mouth 2 (two) times daily.    PIPERACILLIN-TAZOBACTAM (ZOSYN) 3.375 GM/50ML IVPB    Inject 50 mLs (3.375 g total) into the vein every 8 (eight) hours. Stop on July10th   SODIUM CHLORIDE 0.9 % INJECTION    10-40 mLs by Intracatheter route as needed (flush).   SODIUM CHLORIDE 0.9 % SOLN 500 ML WITH VANCOMYCIN 10 G SOLR 1,500 MG    Inject 1,500 mg into the vein daily. Stop date on July 10th   Physical Exam: Filed Vitals:   03/16/13 1550  BP: 145/74  Physical Exam  Constitutional: No distress.  Chronically ill appearing male  HENT:  Head: Normocephalic and atraumatic.  Right Ear: External ear normal.  Left Ear: External ear normal.  Nose: Nose normal.  Mouth/Throat: Oropharynx is clear and moist. No oropharyngeal exudate.  Eyes: Conjunctivae and EOM are normal. Pupils are equal, round, and reactive to light.  Neck: Neck supple.  Cardiovascular: Normal rate, regular rhythm, normal heart sounds and intact distal pulses.   Pulmonary/Chest: Effort normal and breath sounds normal. No respiratory distress.  Abdominal: Soft. Bowel sounds are normal. He exhibits no distension and no mass. There is no tenderness.  Musculoskeletal: Normal range of motion. He exhibits no edema and no tenderness.  Right bka w/o erythema warmth , minimal drainage  Neurological: He is alert. No cranial nerve deficit.  Skin:  Ulcers present on left foot with necrosis  Psychiatric:  Flat affect     Labs reviewed: Basic Metabolic Panel:  Recent Labs  10/07/12 0555 10/08/12 0500  10/13/12 1155  10/16/12 0335  10/18/12 0400 10/19/12 0600 10/20/12 0510  NA 146* 146*  < > 152*  < > 146*  < > 147* 140 136  K 3.3* 3.7  < > 3.0*  < > 3.4*  < > 4.2 4.0 3.6  CL 110 111  < > 117*  < > 113*  < > 115* 106 102  CO2 18* 17*  < > 27  < > 24  < > 24 23 23   GLUCOSE 182* 212*  < > 183*  < > 100*  < > 115* 108* 107*  BUN 33* 30*  < > 18  < > 17  < > 14 13 13   CREATININE 3.12* 2.80*  < > 1.66*  < > 1.55*  < > 1.44* 1.40* 1.43*  CALCIUM 8.9 9.0  < >  8.7  < > 8.2*  < > 8.7 8.7 8.3*  MG  --   --   < >  --   --  2.0  --   --  1.8 1.8  PHOS 5.2* 4.8*  --  2.4  --   --   --   --   --   --   < > = values in this interval not displayed. Liver Function Tests:  Recent Labs  10/12/12 0530 10/13/12 1155 10/19/12 0600 10/20/12 0510  AST 22  --  31 21  ALT 19  --  21 17  ALKPHOS 177*  --  179* 159*  BILITOT 1.6*  --  1.8* 1.5*  PROT 7.5  --  7.0 6.0  ALBUMIN 1.7* 1.7* 1.9* 1.7*    Recent Labs  08/08/12 1145  AMMONIA 19   CBC:  Recent Labs  10/12/12 0530  10/16/12 0335 10/19/12 0600 10/20/12 0510  WBC 13.7*  < > 7.3 6.2 4.7  NEUTROABS 10.7*  --   --  3.1 2.1  HGB 8.4*  < > 8.8* 9.1* 8.6*  HCT 25.5*  < > 27.1* 27.7* 26.5*  MCV 82.5  < > 82.4 82.9 83.1  PLT 447*  < > 352 291 249  < > = values in this interval not displayed. Cardiac Enzymes:  Recent Labs  09/10/12 0430  CKTOTAL 35   CBG:  Recent Labs  10/19/12 2251 10/20/12 0759 10/20/12 1129  GLUCAP 89 123* 140*   Imaging and Procedures: Mr Mra Abdomen Wo Contrast  09/11/2012 *RADIOLOGY REPORT* Clinical Data: Renal insufficiency and hypertension. History of diabetes and coronary artery disease. MRA  ABDOMEN WITHOUT CONTRAST Technique: Angiographic images of the abdomen were obtained using MRA technique without intravenous contrast. Comparison: None. Findings: Unenhanced sequences were utilized for evaluation of the mid abdominal aorta and renal arteries due to underlying renal insufficiency. The renal arteries are well visualized. A single left renal artery shows normal patency. A single right renal artery does show evidence of proximal disease with stenosis identified. This does not appear critical in severity but does likely approach moderate degree of narrowing of roughly 50 - 70%. Origins of the celiac axis, superior mesenteric artery and inferior mesenteric artery are normally patent. The visualized segment of the abdominal aorta shows no aneurysmal disease. No other  incidental findings are identified on the limited acquisition. There does not appear to be any evidence of hydronephrosis. 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 The left renal artery is normally patent. Original Report Authenticated By: Aletta Edouard, M.D.   US Renal  09/08/2012 *RADIOLOGY REPORT* Clinical Data: Acute renal failure RENAL/URINARY TRACT ULTRASOUND COMPLETE Comparison: None. Findings: Right Kidney: Normal in size at 12.5 cm. The renal parenchyma is diffusely echogenic. No evidence of mass or hydronephrosis. Left Kidney: Normal in size at 12.3 cm. The renal parenchyma is diffusely echogenic. No evidence of mass or hydronephrosis. Bladder: Appears normal for degree of bladder distention. Other: Bilateral pleural effusions. Sludge and small stones layer within the gallbladder lumen. IMPRESSION: 1. Negative for hydronephrosis. 2. Echogenic kidneys bilaterally consistent with underlying medical renal disease. 3. Small bilateral pleural effusions incidentally noted. 4. Gallbladder sludge and / or small stones. Original Report Authenticated By: Jacqulynn Cadet, M.D.   Dg Chest Port 1 View  09/07/2012 *RADIOLOGY REPORT* Clinical Data: Fever PORTABLE CHEST - 1 VIEW Comparison: 08/09/2012 Findings: There is been previous median sternotomy and CABG. Right arm PICC has been withdrawn somewhat, the tip now in the SVC just above the azygos level. There is worsened pulmonary density, right more than left, probably representing interstitial edema. One could not rule out developing widespread pneumonia. No effusions. Bony structures are unremarkable. IMPRESSION: Some withdrawal of the right arm PICC with the tip now in the SVC just above the azygos level. Worsened pulmonary density, more notable on the right than the left. This is most consistent with developing pulmonary edema, though pneumonia is possible as well. Original Report  Authenticated By: Nelson Chimes, M.D.   Dg Abd Acute W/chest  09/09/2012 *RADIOLOGY REPORT* Clinical Data: Vomiting. Nausea. ACUTE ABDOMEN SERIES (ABDOMEN 2 VIEW & CHEST 1 VIEW) Comparison: Multiple exams, including 09/07/2012 and 08/09/2012 Findings: Right-sided PICC line tip: The upper SVC. Prior CABG noted with borderline cardiomegaly (cardiothoracic index 55%). Indistinct pulmonary vasculature noted compatible with pulmonary venous hypertension. No overt edema. No pleural effusion identified. No free intraperitoneal gas noted within the abdomen. No significant abnormal air-fluid levels or dilated bowel noted. Lumbar spondylosis and levoconvex scoliosis noted. IMPRESSION: 1. Unremarkable bowel gas pattern. 2. Borderline cardiomegaly with pulmonary venous hypertension. Original Report Authenticated By: Van Clines, M.D.   Assessment/Plan 1. Gangrene of foot Left -will f/u with Dr. Mayer Camel -wound care nurse managing wound on left foot, but does not have good circulation to the wounds so left bka also anticipated  2. Wound eschar of foot On left, poorly healing wound  3. Type II or unspecified type diabetes mellitus with peripheral circulatory disorders, uncontrolled(250.72) Contributing to his ulcerations and poorly healing wounds Monitor ac and hs, will probably need increased doses of insulin in context of osteomyelitis  and poorly healing wounds  4. Chronic systolic heart failure Fluid restriction, daily weight, cont current meds, monitor for sob and edema  5. Hypertension Will adjust meds if staying over 150/90 consistently  6. Hx of right BKA Is here primarily for rehab after this and for wound care of this surgical site and wounds on his left foot  7. Tobacco abuse Was smoking prior to admission, cannot smoke while here  8. Major depressive disorder, recurrent episode, moderate -cont prozac, may need additional meds as he is going through a difficult time with amputations, also  normally drinks alcohol and smokes and he cannot do either of these while here for rehab  Functional status:  Requires assistance with bathing, dressing, transfers until completes pt, ot  Family/ staff Communication: seen with unit supervisor and plan of care reviewed with her  Labs/tests ordered:  F/u with Dr. Elmarie Shiley (renal) in 1 wk, Dr. Mayer Camel (ortho) re: right BKA in 2 wks, cbc, bmp next draw

## 2012-09-13 ENCOUNTER — Inpatient Hospital Stay: Payer: Medicare Other | Admitting: Internal Medicine

## 2012-09-13 NOTE — Progress Notes (Signed)
Clinical social worker assisted with patient discharge to skilled nursing facility, [facility].  CSW addressed all family questions and concerns. CSW copied chart and added all important documents. CSW also set up patient transportation with [facility]. Clinical Social Worker will sign off for now as social work intervention is no longer needed.   Abbiegail Landgren, MSW,  [phone] 

## 2012-09-21 ENCOUNTER — Non-Acute Institutional Stay (SKILLED_NURSING_FACILITY): Payer: Medicare Other | Admitting: Internal Medicine

## 2012-09-21 DIAGNOSIS — R630 Anorexia: Secondary | ICD-10-CM

## 2012-09-21 DIAGNOSIS — E118 Type 2 diabetes mellitus with unspecified complications: Secondary | ICD-10-CM | POA: Insufficient documentation

## 2012-09-21 DIAGNOSIS — R112 Nausea with vomiting, unspecified: Secondary | ICD-10-CM | POA: Insufficient documentation

## 2012-09-21 DIAGNOSIS — IMO0002 Reserved for concepts with insufficient information to code with codable children: Secondary | ICD-10-CM | POA: Insufficient documentation

## 2012-09-21 DIAGNOSIS — E1165 Type 2 diabetes mellitus with hyperglycemia: Secondary | ICD-10-CM

## 2012-09-21 NOTE — Progress Notes (Signed)
Patient ID: [name], male   DOB: 09/06/1950, 62 y.o.   MRN: [id number]  CC- AV- nausea, vomiting, decreased appetite  HPI- 62 y/o male patient with recent right BKA, hx of DM, HTN here for STR was seen today with nurse complaining about patient not eating his meals and having occassional vomiting of food content. cbg between 100-250 and mostly in 150-180s and one reading of 57 and one of 308. He is on metoclopramide and insulin treatment. He denies any abdominal pain. He mentions not having an appetite. He has flat affect on conversation. Denies any complaints. He also does not like the appearance of his food. He is on mechanical soft diet. He likes fruits and pudding  Review of Systems  Constitutional: Positive for malaise/fatigue. Negative for fever, chills and diaphoresis.  Eyes: Negative for blurred vision.  Respiratory: Negative for cough and shortness of breath.   Cardiovascular: Negative for chest pain and palpitations.  Gastrointestinal: Negative for heartburn and constipation.  Genitourinary: Negative for dysuria.  Musculoskeletal: Negative for falls.  Skin: Negative for rash.  Neurological: Negative for dizziness and headaches.  Psychiatric/Behavioral: Positive for depression.   No Known Allergies  Medication reviewed  BP 140/72  Pulse 88  Temp(Src) 98.4 F (36.9 C)  Resp 17  SpO2 97%  gen- adult male, in NAD HEENT- No pallor, no icterus, no LAD, MMM CVS- ns 1,s2, rrr respi- CTAB abdo- bs+, soft, non tender Ext- right BKA and has dressing on it, left leg has una boot in place Neuro- aaox 3, n focal deficit Psych- flat affect  CBC    Component Value Date/Time   WBC 10.2 09/11/2012 0600   RBC 2.97* 09/11/2012 0600   HGB 8.1* 09/11/2012 0600   HCT 24.2* 09/11/2012 0600   PLT 346 09/11/2012 0600   MCV 81.5 09/11/2012 0600   MCH 27.3 09/11/2012 0600   MCHC 33.5 09/11/2012 0600   RDW 15.7* 09/11/2012 0600   LYMPHSABS 1.8 09/06/2012 0951   MONOABS 1.5* 09/06/2012 0951   EOSABS 0.0  09/06/2012 0951   BASOSABS 0.0 09/06/2012 0951    CMP     Component Value Date/Time   NA 137 09/11/2012 0600   K 3.9 09/11/2012 0600   CL 104 09/11/2012 0600   CO2 26 09/11/2012 0600   GLUCOSE 95 09/11/2012 0600   BUN 31* 09/11/2012 0600   CREATININE 2.22* 09/11/2012 0600   CALCIUM 8.0* 09/11/2012 0600   PROT 6.7 08/08/2012 1145   ALBUMIN 1.5* 08/08/2012 1145   AST 26 08/08/2012 1145   ALT 16 08/08/2012 1145   ALKPHOS 168* 08/08/2012 1145   BILITOT 2.0* 08/08/2012 1145   GFRNONAA 30* 09/11/2012 0600   GFRAA 35* 09/11/2012 0600    ASSESSMENT/PLAN-  Nausea and vomiting- concerns for worsening of his gastroparesis in setting of DM. Will increase metoclopramide to 10 mg before meals and reassess. Small , frequent meals recommended  DM- continue current regimen of insulin and monitor cbg, avoid skipping meals   Poor appetite- has flat affect. Recent BKA and other comorbidities at this age could be causing depression and also affecting his appetite. Pain under control. Will start him on remeron to help both with mood and appetite.

## 2012-09-26 ENCOUNTER — Emergency Department (HOSPITAL_COMMUNITY): Payer: Medicare Other

## 2012-09-26 ENCOUNTER — Inpatient Hospital Stay (HOSPITAL_COMMUNITY)
Admission: EM | Admit: 2012-09-26 | Discharge: 2012-10-20 | DRG: 239 | Disposition: A | Discharge status: Skilled Nursing Facility | Payer: Medicare Other | Attending: Internal Medicine | Admitting: Internal Medicine

## 2012-09-26 ENCOUNTER — Encounter (HOSPITAL_COMMUNITY): Payer: Self-pay | Admitting: Emergency Medicine

## 2012-09-26 DIAGNOSIS — R630 Anorexia: Secondary | ICD-10-CM

## 2012-09-26 DIAGNOSIS — F331 Major depressive disorder, recurrent, moderate: Secondary | ICD-10-CM | POA: Diagnosis present

## 2012-09-26 DIAGNOSIS — I96 Gangrene, not elsewhere classified: Secondary | ICD-10-CM | POA: Diagnosis present

## 2012-09-26 DIAGNOSIS — E1110 Type 2 diabetes mellitus with ketoacidosis without coma: Secondary | ICD-10-CM | POA: Diagnosis present

## 2012-09-26 DIAGNOSIS — E876 Hypokalemia: Secondary | ICD-10-CM | POA: Diagnosis present

## 2012-09-26 DIAGNOSIS — R509 Fever, unspecified: Secondary | ICD-10-CM | POA: Diagnosis present

## 2012-09-26 DIAGNOSIS — I509 Heart failure, unspecified: Secondary | ICD-10-CM | POA: Diagnosis present

## 2012-09-26 DIAGNOSIS — L97509 Non-pressure chronic ulcer of other part of unspecified foot with unspecified severity: Secondary | ICD-10-CM | POA: Diagnosis present

## 2012-09-26 DIAGNOSIS — R748 Abnormal levels of other serum enzymes: Secondary | ICD-10-CM | POA: Diagnosis present

## 2012-09-26 DIAGNOSIS — K3184 Gastroparesis: Secondary | ICD-10-CM | POA: Diagnosis present

## 2012-09-26 DIAGNOSIS — A689 Relapsing fever, unspecified: Secondary | ICD-10-CM | POA: Diagnosis present

## 2012-09-26 DIAGNOSIS — N390 Urinary tract infection, site not specified: Secondary | ICD-10-CM | POA: Diagnosis present

## 2012-09-26 DIAGNOSIS — E1149 Type 2 diabetes mellitus with other diabetic neurological complication: Secondary | ICD-10-CM | POA: Diagnosis present

## 2012-09-26 DIAGNOSIS — E118 Type 2 diabetes mellitus with unspecified complications: Secondary | ICD-10-CM

## 2012-09-26 DIAGNOSIS — B3749 Other urogenital candidiasis: Secondary | ICD-10-CM | POA: Diagnosis present

## 2012-09-26 DIAGNOSIS — K219 Gastro-esophageal reflux disease without esophagitis: Secondary | ICD-10-CM | POA: Diagnosis present

## 2012-09-26 DIAGNOSIS — I129 Hypertensive chronic kidney disease with stage 1 through stage 4 chronic kidney disease, or unspecified chronic kidney disease: Secondary | ICD-10-CM | POA: Diagnosis present

## 2012-09-26 DIAGNOSIS — Z9849 Cataract extraction status, unspecified eye: Secondary | ICD-10-CM

## 2012-09-26 DIAGNOSIS — Z87891 Personal history of nicotine dependence: Secondary | ICD-10-CM

## 2012-09-26 DIAGNOSIS — Z8673 Personal history of transient ischemic attack (TIA), and cerebral infarction without residual deficits: Secondary | ICD-10-CM

## 2012-09-26 DIAGNOSIS — E1159 Type 2 diabetes mellitus with other circulatory complications: Principal | ICD-10-CM | POA: Diagnosis present

## 2012-09-26 DIAGNOSIS — N170 Acute kidney failure with tubular necrosis: Secondary | ICD-10-CM | POA: Diagnosis present

## 2012-09-26 DIAGNOSIS — D500 Iron deficiency anemia secondary to blood loss (chronic): Secondary | ICD-10-CM | POA: Diagnosis present

## 2012-09-26 DIAGNOSIS — E43 Unspecified severe protein-calorie malnutrition: Secondary | ICD-10-CM | POA: Diagnosis not present

## 2012-09-26 DIAGNOSIS — R234 Changes in skin texture: Secondary | ICD-10-CM | POA: Diagnosis present

## 2012-09-26 DIAGNOSIS — D62 Acute posthemorrhagic anemia: Secondary | ICD-10-CM | POA: Diagnosis not present

## 2012-09-26 DIAGNOSIS — F329 Major depressive disorder, single episode, unspecified: Secondary | ICD-10-CM

## 2012-09-26 DIAGNOSIS — N179 Acute kidney failure, unspecified: Secondary | ICD-10-CM

## 2012-09-26 DIAGNOSIS — L97519 Non-pressure chronic ulcer of other part of right foot with unspecified severity: Secondary | ICD-10-CM

## 2012-09-26 DIAGNOSIS — S88119A Complete traumatic amputation at level between knee and ankle, unspecified lower leg, initial encounter: Secondary | ICD-10-CM

## 2012-09-26 DIAGNOSIS — E870 Hyperosmolality and hypernatremia: Secondary | ICD-10-CM | POA: Diagnosis present

## 2012-09-26 DIAGNOSIS — D649 Anemia, unspecified: Secondary | ICD-10-CM | POA: Diagnosis present

## 2012-09-26 DIAGNOSIS — I2510 Atherosclerotic heart disease of native coronary artery without angina pectoris: Secondary | ICD-10-CM | POA: Diagnosis present

## 2012-09-26 DIAGNOSIS — N183 Chronic kidney disease, stage 3 unspecified: Secondary | ICD-10-CM | POA: Diagnosis present

## 2012-09-26 DIAGNOSIS — I5022 Chronic systolic (congestive) heart failure: Secondary | ICD-10-CM | POA: Diagnosis present

## 2012-09-26 DIAGNOSIS — Z951 Presence of aortocoronary bypass graft: Secondary | ICD-10-CM

## 2012-09-26 DIAGNOSIS — I10 Essential (primary) hypertension: Secondary | ICD-10-CM | POA: Diagnosis present

## 2012-09-26 DIAGNOSIS — R05 Cough: Secondary | ICD-10-CM

## 2012-09-26 DIAGNOSIS — R58 Hemorrhage, not elsewhere classified: Secondary | ICD-10-CM | POA: Diagnosis not present

## 2012-09-26 DIAGNOSIS — F1010 Alcohol abuse, uncomplicated: Secondary | ICD-10-CM

## 2012-09-26 DIAGNOSIS — R112 Nausea with vomiting, unspecified: Secondary | ICD-10-CM | POA: Diagnosis present

## 2012-09-26 DIAGNOSIS — E1310 Other specified diabetes mellitus with ketoacidosis without coma: Secondary | ICD-10-CM | POA: Diagnosis present

## 2012-09-26 DIAGNOSIS — Z89511 Acquired absence of right leg below knee: Secondary | ICD-10-CM

## 2012-09-26 HISTORY — DX: Type 2 diabetes mellitus without complications: E11.9

## 2012-09-26 HISTORY — DX: Pure hypercholesterolemia, unspecified: E78.00

## 2012-09-26 HISTORY — DX: Shortness of breath: R06.02

## 2012-09-26 LAB — COMPREHENSIVE METABOLIC PANEL
ALT: 20 U/L (ref 0–53)
AST: 23 U/L (ref 0–37)
Albumin: 1.8 g/dL — ABNORMAL LOW (ref 3.5–5.2)
Alkaline Phosphatase: 203 U/L — ABNORMAL HIGH (ref 39–117)
Calcium: 9.1 mg/dL (ref 8.4–10.5)
Potassium: 3.7 mEq/L (ref 3.5–5.1)
Sodium: 138 mEq/L (ref 135–145)
Total Protein: 8.3 g/dL (ref 6.0–8.3)

## 2012-09-26 LAB — URINE MICROSCOPIC-ADD ON

## 2012-09-26 LAB — URINALYSIS, ROUTINE W REFLEX MICROSCOPIC
Bilirubin Urine: NEGATIVE
Glucose, UA: NEGATIVE mg/dL
Specific Gravity, Urine: 1.012 (ref 1.005–1.030)
Urobilinogen, UA: 1 mg/dL (ref 0.0–1.0)
pH: 7 (ref 5.0–8.0)

## 2012-09-26 LAB — CBC WITH DIFFERENTIAL/PLATELET
Basophils Absolute: 0 10*3/uL (ref 0.0–0.1)
Eosinophils Absolute: 0 10*3/uL (ref 0.0–0.7)
Eosinophils Relative: 0 % (ref 0–5)
Lymphocytes Relative: 14 % (ref 12–46)
Lymphs Abs: 2.3 10*3/uL (ref 0.7–4.0)
MCH: 26.1 pg (ref 26.0–34.0)
MCV: 82.2 fL (ref 78.0–100.0)
Neutrophils Relative %: 75 % (ref 43–77)
Platelets: 374 10*3/uL (ref 150–400)
RBC: 2.87 MIL/uL — ABNORMAL LOW (ref 4.22–5.81)
RDW: 15.3 % (ref 11.5–15.5)
WBC: 16.5 10*3/uL — ABNORMAL HIGH (ref 4.0–10.5)

## 2012-09-26 LAB — LACTIC ACID, PLASMA: Lactic Acid, Venous: 1 mmol/L (ref 0.5–2.2)

## 2012-09-26 LAB — MRSA PCR SCREENING: MRSA by PCR: NEGATIVE

## 2012-09-26 MED ORDER — ACETAMINOPHEN 325 MG PO TABS
650.0000 mg | ORAL_TABLET | Freq: Once | ORAL | Status: AC
  Administered 2012-09-26: 650 mg via ORAL
  Filled 2012-09-26: qty 2

## 2012-09-26 MED ORDER — ENOXAPARIN SODIUM 40 MG/0.4ML ~~LOC~~ SOLN
40.0000 mg | SUBCUTANEOUS | Status: DC
  Administered 2012-09-26 – 2012-09-28 (×3): 40 mg via SUBCUTANEOUS
  Filled 2012-09-26 (×4): qty 0.4

## 2012-09-26 MED ORDER — ACETAMINOPHEN 325 MG PO TABS
650.0000 mg | ORAL_TABLET | Freq: Four times a day (QID) | ORAL | Status: DC | PRN
  Administered 2012-09-27 – 2012-10-04 (×5): 650 mg via ORAL
  Filled 2012-09-26 (×5): qty 2

## 2012-09-26 MED ORDER — PANTOPRAZOLE SODIUM 40 MG PO TBEC
40.0000 mg | DELAYED_RELEASE_TABLET | Freq: Every morning | ORAL | Status: DC
  Administered 2012-09-27 – 2012-10-06 (×10): 40 mg via ORAL
  Filled 2012-09-26 (×7): qty 1

## 2012-09-26 MED ORDER — INSULIN DETEMIR 100 UNIT/ML ~~LOC~~ SOLN
15.0000 [IU] | Freq: Every day | SUBCUTANEOUS | Status: DC
  Administered 2012-09-27 – 2012-09-28 (×2): 15 [IU] via SUBCUTANEOUS
  Filled 2012-09-26 (×5): qty 0.15

## 2012-09-26 MED ORDER — ONDANSETRON HCL 4 MG PO TABS: 4.0000 mg | ORAL_TABLET | Freq: Four times a day (QID) | ORAL | Status: DC | PRN

## 2012-09-26 MED ORDER — ASPIRIN 325 MG PO TABS
325.0000 mg | ORAL_TABLET | Freq: Two times a day (BID) | ORAL | Status: DC
  Administered 2012-09-26 – 2012-10-06 (×19): 325 mg via ORAL
  Filled 2012-09-26 (×31): qty 1

## 2012-09-26 MED ORDER — PIPERACILLIN-TAZOBACTAM 3.375 G IVPB
3.3750 g | Freq: Once | INTRAVENOUS | Status: AC
  Administered 2012-09-26: 3.375 g via INTRAVENOUS
  Filled 2012-09-26: qty 50

## 2012-09-26 MED ORDER — POLYETHYLENE GLYCOL 3350 17 G PO PACK
17.0000 g | PACK | Freq: Every day | ORAL | Status: DC
  Administered 2012-09-27 – 2012-10-16 (×7): 17 g via ORAL
  Filled 2012-09-26 (×21): qty 1

## 2012-09-26 MED ORDER — VANCOMYCIN HCL IN DEXTROSE 1-5 GM/200ML-% IV SOLN: 1000.0000 mg | Freq: Once | INTRAVENOUS | Status: DC

## 2012-09-26 MED ORDER — ACETAMINOPHEN 650 MG RE SUPP: 650.0000 mg | Freq: Four times a day (QID) | RECTAL | Status: DC | PRN

## 2012-09-26 MED ORDER — ONDANSETRON HCL 4 MG/2ML IJ SOLN
4.0000 mg | Freq: Four times a day (QID) | INTRAMUSCULAR | Status: DC | PRN
  Administered 2012-09-27 – 2012-10-13 (×23): 4 mg via INTRAVENOUS
  Filled 2012-09-26 (×25): qty 2

## 2012-09-26 MED ORDER — INSULIN ASPART 100 UNIT/ML ~~LOC~~ SOLN
0.0000 [IU] | Freq: Three times a day (TID) | SUBCUTANEOUS | Status: DC
  Administered 2012-09-27: 2 [IU] via SUBCUTANEOUS
  Administered 2012-09-27: 3 [IU] via SUBCUTANEOUS
  Administered 2012-09-29 – 2012-09-30 (×3): 1 [IU] via SUBCUTANEOUS
  Administered 2012-10-01: 2 [IU] via SUBCUTANEOUS
  Administered 2012-10-01: 3 [IU] via SUBCUTANEOUS
  Administered 2012-10-01: 2 [IU] via SUBCUTANEOUS
  Administered 2012-10-02 (×2): 1 [IU] via SUBCUTANEOUS
  Administered 2012-10-02: 2 [IU] via SUBCUTANEOUS
  Administered 2012-10-03 – 2012-10-04 (×5): 1 [IU] via SUBCUTANEOUS
  Administered 2012-10-04: 3 [IU] via SUBCUTANEOUS
  Administered 2012-10-05 – 2012-10-06 (×4): 2 [IU] via SUBCUTANEOUS
  Administered 2012-10-06: 3 [IU] via SUBCUTANEOUS
  Administered 2012-10-06 – 2012-10-07 (×2): 2 [IU] via SUBCUTANEOUS
  Administered 2012-10-07: 1 [IU] via SUBCUTANEOUS
  Administered 2012-10-07: 2 [IU] via SUBCUTANEOUS
  Administered 2012-10-08: 3 [IU] via SUBCUTANEOUS
  Administered 2012-10-08 (×2): 2 [IU] via SUBCUTANEOUS
  Administered 2012-10-09: 3 [IU] via SUBCUTANEOUS
  Administered 2012-10-09 – 2012-10-10 (×3): 2 [IU] via SUBCUTANEOUS
  Administered 2012-10-10: 3 [IU] via SUBCUTANEOUS
  Administered 2012-10-11: 2 [IU] via SUBCUTANEOUS
  Administered 2012-10-11: 1 [IU] via SUBCUTANEOUS
  Administered 2012-10-11: 2 [IU] via SUBCUTANEOUS

## 2012-09-26 MED ORDER — METOCLOPRAMIDE HCL 5 MG PO TABS
5.0000 mg | ORAL_TABLET | Freq: Three times a day (TID) | ORAL | Status: DC
  Administered 2012-09-27 – 2012-10-04 (×20): 5 mg via ORAL
  Filled 2012-09-26 (×26): qty 1

## 2012-09-26 MED ORDER — SODIUM CHLORIDE 0.9 % IV SOLN: INTRAVENOUS | Status: DC

## 2012-09-26 MED ORDER — ALUM & MAG HYDROXIDE-SIMETH 200-200-20 MG/5ML PO SUSP
30.0000 mL | Freq: Four times a day (QID) | ORAL | Status: DC | PRN
  Filled 2012-09-26: qty 30

## 2012-09-26 MED ORDER — METOPROLOL TARTRATE 25 MG PO TABS
25.0000 mg | ORAL_TABLET | Freq: Two times a day (BID) | ORAL | Status: DC
  Administered 2012-09-26 – 2012-09-29 (×5): 25 mg via ORAL
  Filled 2012-09-26 (×7): qty 1

## 2012-09-26 MED ORDER — MORPHINE SULFATE 2 MG/ML IJ SOLN
2.0000 mg | INTRAMUSCULAR | Status: DC | PRN
  Administered 2012-09-27 – 2012-10-17 (×38): 2 mg via INTRAVENOUS
  Filled 2012-09-26 (×40): qty 1

## 2012-09-26 MED ORDER — PIPERACILLIN-TAZOBACTAM 3.375 G IVPB
3.3750 g | Freq: Three times a day (TID) | INTRAVENOUS | Status: DC
  Administered 2012-09-27 – 2012-09-29 (×7): 3.375 g via INTRAVENOUS
  Filled 2012-09-26 (×9): qty 50

## 2012-09-26 MED ORDER — OXYCODONE HCL 5 MG PO TABS
5.0000 mg | ORAL_TABLET | ORAL | Status: DC | PRN
  Administered 2012-09-27 – 2012-10-03 (×14): 10 mg via ORAL
  Administered 2012-10-03 (×2): 5 mg via ORAL
  Administered 2012-10-04 – 2012-10-06 (×6): 10 mg via ORAL
  Administered 2012-10-16: 5 mg via ORAL
  Administered 2012-10-16: 10 mg via ORAL
  Administered 2012-10-19: 5 mg via ORAL
  Filled 2012-09-26 (×2): qty 2
  Filled 2012-09-26: qty 1
  Filled 2012-09-26 (×4): qty 2
  Filled 2012-09-26: qty 1
  Filled 2012-09-26 (×3): qty 2
  Filled 2012-09-26: qty 1
  Filled 2012-09-26 (×12): qty 2

## 2012-09-26 MED ORDER — DOCUSATE SODIUM 100 MG PO CAPS
100.0000 mg | ORAL_CAPSULE | Freq: Two times a day (BID) | ORAL | Status: DC
  Administered 2012-09-27 – 2012-10-06 (×13): 100 mg via ORAL
  Filled 2012-09-26 (×23): qty 1

## 2012-09-26 MED ORDER — MORPHINE SULFATE 4 MG/ML IJ SOLN
4.0000 mg | Freq: Once | INTRAMUSCULAR | Status: AC
  Administered 2012-09-26: 4 mg via INTRAVENOUS
  Filled 2012-09-26: qty 1

## 2012-09-26 MED ORDER — SODIUM CHLORIDE 0.9 % IV SOLN
500.0000 mg | INTRAVENOUS | Status: DC
  Administered 2012-09-26: 500 mg via INTRAVENOUS
  Filled 2012-09-26 (×2): qty 10

## 2012-09-26 MED ORDER — SODIUM CHLORIDE 0.9 % IJ SOLN: 10.0000 mL | INTRAMUSCULAR | Status: DC | PRN

## 2012-09-26 MED ORDER — HYDRALAZINE HCL 50 MG PO TABS
50.0000 mg | ORAL_TABLET | Freq: Three times a day (TID) | ORAL | Status: DC
  Administered 2012-09-26 – 2012-10-07 (×29): 50 mg via ORAL
  Filled 2012-09-26 (×37): qty 1

## 2012-09-26 MED ORDER — ONDANSETRON HCL 4 MG/2ML IJ SOLN
4.0000 mg | Freq: Once | INTRAMUSCULAR | Status: AC
  Administered 2012-09-26: 4 mg via INTRAVENOUS
  Filled 2012-09-26: qty 2

## 2012-09-26 MED ORDER — SODIUM CHLORIDE 0.9 % IV SOLN
Freq: Once | INTRAVENOUS | Status: AC
  Administered 2012-09-26: 20:00:00 via INTRAVENOUS

## 2012-09-26 MED ORDER — SODIUM CHLORIDE 0.9 % IV SOLN
INTRAVENOUS | Status: DC
  Administered 2012-09-27: 02:00:00 via INTRAVENOUS

## 2012-09-26 NOTE — Progress Notes (Signed)
ANTIBIOTIC CONSULT NOTE - INITIAL  Pharmacy Consult for Zosyn, Cubicin Indication: Rt foot necrosis  No Known Allergies  Patient Measurements:   Adjusted Body Weight:   Vital Signs: Temp: 97.9 F (36.6 C) (07/22 2023) Temp src: Oral (07/22 2023) BP: 114/60 mmHg (07/22 2023) Pulse Rate: 87 (07/22 2023) Intake/Output from previous day:   Intake/Output from this shift:    Labs:  Recent Labs  09/26/12 1421  WBC 16.5*  HGB 7.5*  PLT 374  CREATININE 1.68*   The CrCl is unknown because both a height and weight (above a minimum accepted value) are required for this calculation. No results found for this basename: VANCOTROUGH, Corlis Leak, VANCORANDOM, GENTTROUGH, GENTPEAK, GENTRANDOM, TOBRATROUGH, TOBRAPEAK, TOBRARND, AMIKACINPEAK, AMIKACINTROU, AMIKACIN,  in the last 72 hours   Microbiology: Recent Results (from the past 720 hour(s))  URINE CULTURE     Status: None   Collection Time    09/07/12  1:22 PM      Result Value Range Status   Specimen Description URINE, RANDOM   Final   Special Requests NONE   Final   Culture  Setup Time 09/07/2012 16:58   Final   Colony Count 9,000 COLONIES/ML   Final   Culture INSIGNIFICANT GROWTH   Final   Report Status 09/08/2012 FINAL   Final    Medical History: Past Medical History  Diagnosis Date  . Hypertension   . 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 Myocardial infarction   . Hx of seasonal allergies   . GERD (gastroesophageal reflux disease)   . High cholesterol   . Exertional shortness of breath     "before my last OR; I'm fine now" (09/26/2012)  . Type II diabetes mellitus     Medications:  Scheduled:  . sodium chloride   Intravenous STAT  . aspirin  325 mg Oral BID  . docusate sodium  100 mg Oral BID  . enoxaparin (LOVENOX) injection  40 mg Subcutaneous Q24H  . hydrALAZINE  50 mg Oral Q8H  . [START ON 09/27/2012] insulin aspart  0-9 Units Subcutaneous TID WC  . insulin detemir  15 Units Subcutaneous QHS  . [START ON  09/27/2012] metoCLOPramide  5 mg Oral TID AC  . metoprolol tartrate  25 mg Oral BID  . [START ON 09/27/2012] pantoprazole  40 mg Oral q morning - 10a  . piperacillin-tazobactam (ZOSYN)  IV  3.375 g Intravenous Once  . [START ON 09/27/2012] polyethylene glycol  17 g Oral Daily   Assessment: Pt had a trasmetatarsal amputation a little over a month ago. Leg is now painful with foul smelling drainage. Pt also has HTN, DM, CVA, depression, chronic kidney disease, GERD. Pt will need further amputation later in the week once his medical issues have been stabilized.   Plan:  1) Zosyn 3.375 Gm IV q8h 2 ) Cubicin  500 mg IV q24hr   Minta Balsam 09/26/2012,9:07 PM

## 2012-09-26 NOTE — ED Notes (Addendum)
Pt presents to department from [facility] for evaluation of nausea and wound to R leg. Pt states he recently had R lower leg amputation. Now states area is painful and has drainage with foul smell. Pt currently has PICC line to R upper arm and receiving antibiotics. He is alert and oriented x4.

## 2012-09-26 NOTE — ED Notes (Signed)
Admitting MD at bedside.

## 2012-09-26 NOTE — ED Provider Notes (Signed)
Patient care transferred from [facility], [place], pending admission to orthopedics for further management of what is believed to be infection associated with recent operative procedure.   Re-examination:  Right BKA. Stump is dry and scaling. One surgical staple remaining in center of wound. Non-tender. Warm to touch without significant redness.  Left leg significantly warm to touch, also without redness. Initially bandaged. Bandage removed and reveals malodorous skin breakdown to dorsum of foot with granulation and eschar tissue present. Toes are cold to touch. No palpable pulses. PT arterial flow observed with bedside ultrasound. No flow observed at [facility].   Patient febrile on arrival to 102. Plan to admit at time of patient care transfer. Discussed with Ortho, Dr. Grandville Silos, who discussed with Dr. Mayer Camel. The patient was seen in his office this morning and referred to ED for admission to hospitalist for coordination of care of multiple medical problems, anticipating left BKA later this week. Discussed with hospitalist who will admit.  Dewaine Oats, PA-C 09/26/12 1932

## 2012-09-26 NOTE — ED Provider Notes (Signed)
History    CSN: [id number] Arrival date & time 09/26/12  1249  First MD Initiated Contact with Patient 09/26/12 1335     Chief Complaint  Patient presents with  . Nausea   (Consider location/radiation/quality/duration/timing/severity/associated sxs/prior Treatment) HPI Comments: Patient is a 62 year old male with history of hypertension, diabetes, stroke, MI, GERD who presents today with 3 days of worsening nausea and vomiting. He has been nauseous the past 3 days and this morning vomited 3 times. The vomit was nonbloody. He cannot recall the name of the food that he ate earlier today. He is a dialysis patient and he dialyzed today. He has not tried any medications to try to feel better. He is unsure of what makes him feel worse. Patient is a poor historian.   The history is provided by the patient. No language interpreter was used.   Past Medical History  Diagnosis Date  . Hypertension   . Diabetes mellitus without complication   . 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 Myocardial infarction   . Depression   . Shortness of breath     with activy  . Hx of seasonal allergies   . GERD (gastroesophageal reflux disease)   . Cataract    Past Surgical History  Procedure Laterality Date  . Coronary artery bypass graft  2008    4 vessel  . Amputation Right 07/29/2012    Procedure: AMPUTATION 3RD TOE;  Surgeon: Kerin Salen, MD;  Location: [place];  Service: Orthopedics;  Laterality: Right;  right third toe amputation  . Amputation Right 08/02/2012    Procedure: right transmetatarsal amputation;  Surgeon: Kerin Salen, MD;  Location: [place];  Service: Orthopedics;  Laterality: Right;  . Eye surgery      catarct  . Amputation Right 09/06/2012    Procedure: AMPUTATION BELOW KNEE;  Surgeon: Kerin Salen, MD;  Location: [place];  Service: Orthopedics;  Laterality: Right;   History reviewed. No pertinent family history. History  Substance Use Topics  . Smoking status: Never Smoker   . Smokeless  tobacco: Not on file  . Alcohol Use: No     Comment: last drink 2010    Review of Systems  Constitutional: Positive for fever.  Gastrointestinal: Positive for nausea and vomiting. Negative for abdominal pain.  Genitourinary: Negative for dysuria, urgency, frequency, decreased urine volume and difficulty urinating.  All other systems reviewed and are negative.    Allergies  Review of patient's allergies indicates no known allergies.  Home Medications   Current Outpatient Rx  Name  Route  Sig  Dispense  Refill  . acetaminophen (TYLENOL) 325 MG tablet   Oral   Take 2 tablets (650 mg total) by mouth every 6 (six) hours as needed.         Marland Kitchen aspirin 325 MG tablet   Oral   Take 1 tablet (325 mg total) by mouth 2 (two) times daily.   30 tablet   0   . hydrALAZINE (APRESOLINE) 50 MG tablet   Oral   Take 1 tablet (50 mg total) by mouth every 8 (eight) hours.         . insulin aspart (NOVOLOG) 100 UNIT/ML injection   Subcutaneous   Inject 0-9 Units into the skin 3 (three) times daily with meals. CBG 70 - 120: 0 units CBG 121 - 150: 1 unit,  CBG 151 - 200: 2 units,  CBG 201 - 250: 3 units,  CBG 251 - 300:  5 units,  CBG 301 - 350: 7 units,  CBG 351 - 400: 9 units   CBG > 400: 9 units and notify your MD   1 vial   12   . insulin aspart (NOVOLOG) 100 UNIT/ML injection   Subcutaneous   Inject 4 Units into the skin 3 (three) times daily with meals.   1 vial   12   . insulin detemir (LEVEMIR) 100 UNIT/ML injection   Subcutaneous   Inject 0.15 mLs (15 Units total) into the skin at bedtime.   10 mL   12   . metoCLOPramide (REGLAN) 5 MG tablet   Oral   Take 1 tablet (5 mg total) by mouth 3 (three) times daily before meals.         . metoprolol tartrate (LOPRESSOR) 25 MG tablet   Oral   Take 1 tablet (25 mg total) by mouth 2 (two) times daily.         . ondansetron (ZOFRAN) 4 MG tablet   Oral   Take 1 tablet (4 mg total) by mouth every 6 (six) hours as needed for  nausea.   20 tablet   0   . oxyCODONE (OXY IR/ROXICODONE) 5 MG immediate release tablet   Oral   Take 1-2 tablets (5-10 mg total) by mouth every 4 (four) hours as needed.   60 tablet   0   . pantoprazole (PROTONIX) 40 MG tablet   Oral   Take 1 tablet (40 mg total) by mouth daily at 6 (six) AM.         . piperacillin-tazobactam (ZOSYN) 3.375 GM/50ML IVPB   Intravenous   Inject 50 mLs (3.375 g total) into the vein every 8 (eight) hours. Stop on July10th   50 mL      . polyethylene glycol (MIRALAX / GLYCOLAX) packet   Oral   Take 17 g by mouth daily.   14 each   0   . sodium chloride 0.9 % injection   Intracatheter   10-40 mLs by Intracatheter route as needed (flush).   5 mL      . sodium chloride 0.9 % SOLN 500 mL with vancomycin 10 G SOLR 1,500 mg   Intravenous   Inject 1,500 mg into the vein daily. Stop date on July 10th          BP 162/68  Pulse 93  Temp(Src) 102.5 F (39.2 C) (Oral)  Resp 20  SpO2 95% Physical Exam  Nursing note and vitals reviewed. Constitutional: He is oriented to person, place, and time. He appears well-developed and well-nourished. No distress.  Malodorous   HENT:  Head: Normocephalic and atraumatic.  Right Ear: External ear normal.  Left Ear: External ear normal.  Nose: Nose normal.  Mouth/Throat: Uvula is midline. Abnormal dentition. Dental caries present.  Poor dentition, missing most teeth  Eyes: Conjunctivae are normal.  Neck: Normal range of motion. No tracheal deviation present.  Cardiovascular: Normal rate, regular rhythm, normal heart sounds, intact distal pulses and normal pulses.   Pulmonary/Chest: Effort normal and breath sounds normal. No stridor.  Abdominal: Soft. He exhibits no distension. There is no tenderness.  Musculoskeletal: Normal range of motion.  Below the knee amputation on right leg, warm to touch, no drainage.   Neurological: He is alert and oriented to person, place, and time.  Skin: Skin is warm and  dry. He is not diaphoretic.  Psychiatric: He has a normal mood and affect. His behavior is normal.  ED Course  Procedures (including critical care time) Labs Reviewed  CBC WITH DIFFERENTIAL - Abnormal; Notable for the following:    WBC 16.5 (*)    RBC 2.87 (*)    Hemoglobin 7.5 (*)    HCT 23.6 (*)    Neutro Abs 12.4 (*)    Monocytes Absolute 1.8 (*)    All other components within normal limits  COMPREHENSIVE METABOLIC PANEL - Abnormal; Notable for the following:    Glucose, Bld 129 (*)    Creatinine, Ser 1.68 (*)    Albumin 1.8 (*)    Alkaline Phosphatase 203 (*)    GFR calc non Af Amer 42 (*)    GFR calc Af Amer 49 (*)    All other components within normal limits  LIPASE, BLOOD  LACTIC ACID, PLASMA  URINALYSIS, ROUTINE W REFLEX MICROSCOPIC   Dg Chest 2 View  09/26/2012   *RADIOLOGY REPORT*  Clinical Data: Chest pain.  CHEST - 2 VIEW  Comparison: 09/07/2012  Findings: A right upper extremity PICC line is present with the tip in the lower SVC.  The heart size is normal and stable with evidence of prior CABG.  Lungs show no evidence of edema, focal consolidation or nodule.  No pleural fluid is identified.  Bony thorax is unremarkable.  IMPRESSION: No active disease.   Original Report Authenticated By: Aletta Edouard, M.D.   Dg Femur Right  09/26/2012   *RADIOLOGY REPORT*  Clinical Data: Pain of the mid right femur.  No known trauma.  RIGHT FEMUR - 2 VIEW  Comparison: Right foot radiographs 07/27/2012  Findings: The joint space of the right hip is maintained.  No significant degenerative changes.  Bony mineralization appears normal.  No fracture or focal bony abnormality of the femur is seen.  Inferior patellar osteophyte noted.  On the lateral view, surgical resection of the distal aspect of the fibula is noted.  Only 6.5 cm of the proximal fibula are present.  There is mild subcutaneous stranding of the visualized portion of the right lower extremity.  IMPRESSION:  1.  No acute bony  abnormality the right femur. 2. Below-the-knee amputation of the fibula noted.  The imaged portion of the proximal tibia continues below the edge of the image. 3.  Suspect mild diffuse subcutaneous edema.   Original Report Authenticated By: Curlene Dolphin, M.D.   1. Wound infection after surgery, initial encounter   2. Fever   3. Type II or unspecified type diabetes mellitus    4. Nausea with vomiting   5. Gangrene of foot   6. UTI (lower urinary tract infection)   7. Anemia     MDM  Patient presents with wound infection s/p right BKA with multiple medical problems. Presents with fever, white count, n/v. Plan is for patient admission. Patient signed out to Charlann Lange, PA-C at shift change. Vital signs currently stable.   Elwyn Lade, PA-C 09/27/12 [phone]

## 2012-09-26 NOTE — ED Notes (Signed)
Pt c/o nausea today; pt sts "he does not feel good"; pt sts last dialysis was today

## 2012-09-26 NOTE — H&P (Signed)
[facility] History and Physical  [name] [id number] DOB: [birth year] DOA: 09/26/2012  Referring physician: Dr Reather Converse  PCP: No PCP Per Patient  Specialists: Orthopedics: Dr Mayer Camel  Chief Complaint: nausea  HPI: [name] is a 62 y.o. 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 Patient endorses some fevers, and chills. Patient denies any shortness of breath, no chest pain, nondominant pain, no dysuria, no diarrhea, no constipation, no weakness. Patient also notes some warmth and slight erythema in the left lower extremity that has superficial lesions. Patient denies any discharge from his left lower extremity. Patient is seen in the ED comprehensive metabolic profile done had a creatinine of 1.68 glucose of 129 alk phosphatase of 203 albumin of 1.8 otherwise was within normal limits. CBC had a white count of 16.5 hemoglobin of 7.5 and a platelet count of 374. Urinalysis done had moderate leukocytes 7-10 white blood cells. Chest x-ray was negative for any infiltrates. X-ray of the right knee showed no acute abnormality. We will called to admit the patient for further evaluation and management.  It was noted that patient had presented from his orthopod's office, Dr. Damita Dunnings office to the ED. I spoke with Dr. Grandville Silos from Dr. Damita Dunnings office who was on call and was told that the hospitalist service had been called to admit this patient in preparation for surgery for amputation of the left lower extremity later on this week once patient's medical issues have been stabilized.   Review of Systems: The patient denies anorexia, fever, weight loss,, vision loss, decreased hearing, hoarseness, chest pain, syncope, dyspnea on exertion, peripheral edema, balance  deficits, hemoptysis, abdominal pain, melena, hematochezia, severe indigestion/heartburn, hematuria, incontinence, genital sores, muscle weakness, suspicious skin lesions, transient blindness, difficulty walking, depression, unusual weight change, abnormal bleeding, enlarged lymph nodes, angioedema, and breast masses.   Past Medical History  Diagnosis Date  . Hypertension   . Diabetes mellitus without complication   . 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 Myocardial infarction   . Depression   . Shortness of breath     with activy  . Hx of seasonal allergies   . GERD (gastroesophageal reflux disease)   . Cataract    Past Surgical History  Procedure Laterality Date  . 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 Amputation Right 07/29/2012    Procedure: AMPUTATION 3RD TOE;  Surgeon: Kerin Salen, MD;  Location: [place];  Service: Orthopedics;  Laterality: Right;  right third toe amputation  . Amputation Right 08/02/2012    Procedure: right transmetatarsal amputation;  Surgeon: Kerin Salen, MD;  Location: [place];  Service: Orthopedics;  Laterality: Right;  . Eye surgery      catarct  . Amputation Right 09/06/2012    Procedure: AMPUTATION BELOW KNEE;  Surgeon: Kerin Salen, MD;  Location: [place];  Service: Orthopedics;  Laterality: Right;  . Peripherally inserted central catheter insertion  09/2012   Social History:  reports that he has never smoked. He does not have any smokeless tobacco history on file. He reports that he does not drink alcohol or use illicit drugs.  No Known Allergies  History reviewed. No pertinent family history.  Prior to Admission medications   Medication Sig Start Date End Date Taking? Authorizing Provider  acetaminophen (TYLENOL) 325 MG tablet Take 2 tablets (650 mg total) by mouth every 6 (six) hours as needed. 08/11/12  Yes Ripudeep Krystal Eaton, MD  aspirin 325 MG tablet Take 1 tablet (325 mg total) by mouth 2 (two) times daily. 09/11/12  Yes Clearnce Sorrel. Williams, PA-C   hydrALAZINE (APRESOLINE) 50 MG tablet Take 1 tablet (50 mg total) by mouth every 8 (eight) hours. 08/11/12  Yes Ripudeep Krystal Eaton, MD  insulin aspart (NOVOLOG) 100 UNIT/ML injection Inject 0-9 Units into the skin 3 (three) times daily with meals. CBG 70 - 120: 0 units CBG 121 - 150: 1 unit,  CBG 151 - 200: 2 units,  CBG 201 - 250: 3 units,  CBG 251 - 300: 5 units,  CBG 301 - 350: 7 units,  CBG 351 - 400: 9 units   CBG > 400: 9 units and notify your MD 08/11/12  Yes Ripudeep K Rai, MD  insulin aspart (NOVOLOG) 100 UNIT/ML injection Inject 4 Units into the skin 3 (three) times daily with meals. 08/11/12  Yes Ripudeep Krystal Eaton, MD  insulin detemir (LEVEMIR) 100 UNIT/ML injection Inject 0.15 mLs (15 Units total) into the skin at bedtime. 08/11/12  Yes Ripudeep Krystal Eaton, MD  metoCLOPramide (REGLAN) 5 MG tablet Take 1 tablet (5 mg total) by mouth 3 (three) times daily before meals. 08/11/12  Yes Ripudeep Krystal Eaton, MD  metoprolol tartrate (LOPRESSOR) 25 MG tablet Take 1 tablet (25 mg total) by mouth 2 (two) times daily. 08/11/12  Yes Ripudeep Krystal Eaton, MD  ondansetron (ZOFRAN) 4 MG tablet Take 1 tablet (4 mg total) by mouth every 6 (six) hours as needed for nausea. 08/11/12  Yes Ripudeep Krystal Eaton, MD  oxyCODONE (OXY IR/ROXICODONE) 5 MG immediate release tablet Take 1-2 tablets (5-10 mg total) by mouth every 4 (four) hours as needed. 09/08/12  Yes Danielle Laliberte, PA-C  pantoprazole (PROTONIX) 40 MG tablet Take 1 tablet (40 mg total) by mouth daily at 6 (six) AM. 08/11/12  Yes Ripudeep K Rai, MD  polyethylene glycol (MIRALAX / GLYCOLAX) packet Take 17 g by mouth daily. 08/11/12  Yes Ripudeep K Rai, MD  sodium chloride 0.9 % injection 10-40 mLs by Intracatheter route as needed (flush). 08/11/12  Yes Ripudeep K Rai, MD  sodium chloride 0.9 % SOLN 500 mL with vancomycin 10 G SOLR 1,500 mg Inject 1,500 mg into the vein daily. Stop date on July 10th 08/12/12  Yes Ripudeep Krystal Eaton, MD  piperacillin-tazobactam (ZOSYN) 3.375 GM/50ML IVPB Inject 50 mLs (3.375  g total) into the vein every 8 (eight) hours. Stop on July10th 08/11/12   Ripudeep Krystal Eaton, MD   Physical Exam: Filed Vitals:   09/26/12 1559 09/26/12 1808 09/26/12 1845 09/26/12 1900  BP: 136/59 142/68 140/64 140/64  Pulse: 107 102 98 102  Temp:  102.8 F (39.3 C)    TempSrc:  Oral    Resp: 16 18 17 18   SpO2: 98% 94% 95% 96%     General:  Well-developed well-nourished in no acute cardiopulmonary distress. Left facial weakness chronic.  Eyes: Pupils equal round and reactive to light and accommodation. Extraocular movements intact.  ENT:  Oropharynx is clear, no lesions, no exudates.  Neck: Supple no lymphadenopathy.  Cardiovascular: Regular rate rhythm no murmurs rubs or gallops. No JVD.  Respiratory: Clear to auscultation bilaterally. No wheezes, no crackles, no rhonchi.  Abdomen: Soft, nontender, nondistended, positive bowel sounds.  Skin: Left lower extremity with mild odorous skin breakdown/lesions to the dorsum of  the foot with granulation and eschar tissue present. Slight warmth.  Musculoskeletal: 5/5 BUE strength, 4/5 LLE strength  Psychiatric: Normal mood. Normal affect. Fair insight. Fair judgment.  Neurologic: Alert and oriented x3. Cranial nerves II through XII are grossly intact. No focal deficits.  Extremities: Status post right BKA. Left lower extremity with mild odorous lesions/ulcers/skin breakdown to the dorsum of the foot with granulation and eschar tissue present.  Labs on Admission:  Basic Metabolic Panel:  Recent Labs Lab 09/26/12 1421  NA 138  K 3.7  CL 100  CO2 25  GLUCOSE 129*  BUN 20  CREATININE 1.68*  CALCIUM 9.1   Liver Function Tests:  Recent Labs Lab 09/26/12 1421  AST 23  ALT 20  ALKPHOS 203*  BILITOT 0.7  PROT 8.3  ALBUMIN 1.8*    Recent Labs Lab 09/26/12 1421  LIPASE 27   No results found for this basename: AMMONIA,  in the last 168 hours CBC:  Recent Labs Lab 09/26/12 1421  WBC 16.5*  NEUTROABS 12.4*  HGB  7.5*  HCT 23.6*  MCV 82.2  PLT 374   Cardiac Enzymes: No results found for this basename: CKTOTAL, CKMB, CKMBINDEX, TROPONINI,  in the last 168 hours  BNP (last 3 results) No results found for this basename: PROBNP,  in the last 8760 hours CBG: No results found for this basename: GLUCAP,  in the last 168 hours  Radiological Exams on Admission: Dg Chest 2 View  09/26/2012   *RADIOLOGY REPORT*  Clinical Data: Chest pain.  CHEST - 2 VIEW  Comparison: 09/07/2012  Findings: A right upper extremity PICC line is present with the tip in the lower SVC.  The heart size is normal and stable with evidence of prior CABG.  Lungs show no evidence of edema, focal consolidation or nodule.  No pleural fluid is identified.  Bony thorax is unremarkable.  IMPRESSION: No active disease.   Original Report Authenticated By: Aletta Edouard, M.D.   Dg Femur Right  09/26/2012   *RADIOLOGY REPORT*  Clinical Data: Pain of the mid right femur.  No known trauma.  RIGHT FEMUR - 2 VIEW  Comparison: Right foot radiographs 07/27/2012  Findings: The joint space of the right hip is maintained.  No significant degenerative changes.  Bony mineralization appears normal.  No fracture or focal bony abnormality of the femur is seen.  Inferior patellar osteophyte noted.  On the lateral view, surgical resection of the distal aspect of the fibula is noted.  Only 6.5 cm of the proximal fibula are present.  There is mild subcutaneous stranding of the visualized portion of the right lower extremity.  IMPRESSION:  1.  No acute bony abnormality the right femur. 2. Below-the-knee amputation of the fibula noted.  The imaged portion of the proximal tibia continues below the edge of the image. 3.  Suspect mild diffuse subcutaneous edema.   Original Report Authenticated By: Curlene Dolphin, M.D.   Dg Knee Complete 4 Views Right  09/26/2012   *RADIOLOGY REPORT*  Clinical Data: History of painful right knee.  No new injury.  RIGHT KNEE - COMPLETE 4+ VIEW   Comparison: 09/24/2012 study.  Findings: The patient has undergone previous below-the-knee amputation.  Surgical clip is seen posterior to the proximal fibula on the lateral image.  There is degenerative spurring of the posterior inferior aspect of the patella.  No fracture or bony destruction is seen.  Joint spaces appear relatively well preserved. No chondrocalcinosis is evident.  IMPRESSION: No acute abnormality is  identified.  Post below-the-knee amputation.  Degenerative spurring of patella.   Original Report Authenticated By: Shanon Brow Call    EKG: None  Assessment/Plan Principal Problem:   Fever Active Problems:   Gangrene of foot   H/O: CVA (cerebrovascular accident)   Hypertension   Anemia   DM (diabetes mellitus) type 2, uncontrolled, with ketoacidosis   Nausea with vomiting   UTI (lower urinary tract infection)   Wound eschar of foot  #1 fever Questionable etiology. Likely secondary to left lower extremity wound/possible Lavinia Sharps is lower extremity with malodorous smell and urinary tract infection. Urine cultures are pending. Will try to get some wound cultures. Will check blood cultures x2. Placed empirically on IV Zosyn and daptomycin. Follow.  #2 left lower extremity malodorous wounds/? Gangrenous lower extremity Per Dr. Nobie Putnam M.D. on call, the plan was patient was to be admitted to the medicine service for stabilization of his chronic medical issues for surgery to be planned this week per orthopedics. Will place patient empirically on IV antibiotics. Orthopedics to follow and schedule surgery.  #3 hypertension Stable. Continue home regimen of hydralazine, metoprolol.  #4 UTI Urine cultures are pending. IV Zosyn.  #5 well controlled type 2 diabetes Hemoglobin A1c was 5.9 on 09/06/2012. Will place on sliding scale insulin and Reglan. follow.  #6 anemia Likely anemia of chronic disease. Patient's hemoglobin is currently 7.5. Will check an anemia panel. May  consider transfusing 2 units of packed red blood cells prior to surgery/in anticipation of surgery.  #7 leukocytosis Likely secondary to problems #214. Blood cultures are pending. Urine cultures are pending. Chest x-ray is negative. Continue empiric IV Zosyn and daptomycin. Follow.  #8 history of CVA On aspirin.  #9 nausea and vomiting Seems to resolve. Patient stated last episode was one day prior to admission. Patient stated tolerated a diet today. Will monitor for now. Continue Reglan.  #10 chronic kidney disease Baseline creatinine 1.2-1.4. Close to baseline. Follow.  #11 prophylaxis PPI for GI prophylaxis. Lovenox for DVT prophylaxis.   Code Status: Full Family Communication: Updated patient no family at bedside. Disposition Plan: Admit to [place].  Time spent: 65 mins  [facility] Pager [phone]  If 7PM-7AM, please contact night-coverage [email or web address] Password [facility] 09/26/2012, 8:20 PM

## 2012-09-26 NOTE — Consult Note (Signed)
[facility], [place], [place], [place] [address] Telephone: [phone]  Fax: [phone]  Vernell Leep, M.D.       Alta Corning, M.D.             Rayvon Char Grandville Silos, M.D. Kathalene Frames. Mayer Camel, M.D.       Malena Catholic, M.D.   Gentry Fitz, M.D. Monico Blitz Rhona Raider, M.D.             Phylliss Bob, M.D.  Normajean Glasgow, M.D.  Patient Name: [name], [name]    Date of Birth: November 19, 1950    Shared ID #:  [id number] Date: 09/26/2012    AGE: 62 Years    Sex: M    Employer:     DOI:       Reason for visit: Hospital and postoperative followup status post right below-knee amputation  Subjective: Status post hospital Admission 07/25/2012 for severe diabetic ketoacidosis with sepsis.  He underwent right third toe MTP amputation 07/29/2012 for gangrene, subsequently refused below knee amputation and underwent transmetatarsal amputation him a left side 08/02/2012.  He went to [facility] for one month and finally consented to right below knee amputation 09/06/2012 and is here today for staple removal.  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  ABIs during hospitalization 07/2012 were 0.62. Marland Kitchen  Patient states that with regards to his right leg he really has no pain.  His staples are still in place.  He does not know of any purulent discharge or swelling.  New concern today is with regards to patient's left leg.  He thinks that skin changes have happened since he left the hospital.  He has had new ulcerations although he does not know when it happened.  He is unable to tell me when he started to have malodorous discharge from the wounds.  He does state that he has had chills and several episodes of emesis for the last 2 days.  Patient states his sugars have been running very high.  He had a  documented temperature of 101.5 at [facility].  Patient denies any pain in the leg.  Review of systems as related to current complaint  reviewed and unchanged.  Objective:The patient is well-developed disheveled male and in no acute distress.  He is awake alert and oriented x3.  Extraocular motion is intact.  No use of accessory muscles for breathing.  No skin rash. Right leg: Well healing right below knee amputation with staples in place.  There is no erythema, swelling, purulent discharge.  No tenderness. Left leg: Below the level of the knee there are multiple areas of skin breakdown and ulceration.  There is a very foul odor.  Skin is lichenified and pulses are difficult to palpate.  Skin is warm.  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  With fever, systemic symptoms, malodorous leg definite concern for worsening infection.  Discussed with patient and recommended that he be admitted to the hospital for antibiotics and left below-knee amputation.  Patient agreeable.  We will arrange transport and discuss with hospitalist team for admission and orthopedic consultation.  Anticipate amputation when cleared medically after patient has been tuned up for surgery.   Electronically Authenticated;  Dictated by Casimer Lanius, MD   Kathalene Frames. Mayer Camel,  M.D.

## 2012-09-27 ENCOUNTER — Encounter (HOSPITAL_COMMUNITY): Payer: Self-pay | Admitting: Anesthesiology

## 2012-09-27 ENCOUNTER — Encounter (HOSPITAL_COMMUNITY)
Admission: EM | Disposition: A | Discharge status: Skilled Nursing Facility | Payer: Self-pay | Source: Home / Self Care | Attending: Family Medicine

## 2012-09-27 ENCOUNTER — Inpatient Hospital Stay (HOSPITAL_COMMUNITY): Payer: Medicare Other | Admitting: Anesthesiology

## 2012-09-27 ENCOUNTER — Inpatient Hospital Stay (HOSPITAL_COMMUNITY): Payer: Medicare Other

## 2012-09-27 DIAGNOSIS — N179 Acute kidney failure, unspecified: Secondary | ICD-10-CM

## 2012-09-27 DIAGNOSIS — I10 Essential (primary) hypertension: Secondary | ICD-10-CM

## 2012-09-27 DIAGNOSIS — R234 Changes in skin texture: Secondary | ICD-10-CM

## 2012-09-27 DIAGNOSIS — T8140XA Infection following a procedure, unspecified, initial encounter: Secondary | ICD-10-CM

## 2012-09-27 DIAGNOSIS — E1165 Type 2 diabetes mellitus with hyperglycemia: Secondary | ICD-10-CM

## 2012-09-27 DIAGNOSIS — R112 Nausea with vomiting, unspecified: Secondary | ICD-10-CM

## 2012-09-27 DIAGNOSIS — Z8673 Personal history of transient ischemic attack (TIA), and cerebral infarction without residual deficits: Secondary | ICD-10-CM

## 2012-09-27 HISTORY — PX: AMPUTATION: SHX166

## 2012-09-27 LAB — GLUCOSE, CAPILLARY
Glucose-Capillary: 200 mg/dL — ABNORMAL HIGH (ref 70–99)
Glucose-Capillary: 227 mg/dL — ABNORMAL HIGH (ref 70–99)

## 2012-09-27 LAB — CBC
HCT: 20.5 % — ABNORMAL LOW (ref 39.0–52.0)
HCT: 26.7 % — ABNORMAL LOW (ref 39.0–52.0)
Hemoglobin: 6.5 g/dL — CL (ref 13.0–17.0)
MCH: 26 pg (ref 26.0–34.0)
MCH: 27.3 pg (ref 26.0–34.0)
MCHC: 31.7 g/dL (ref 30.0–36.0)
MCHC: 33 g/dL (ref 30.0–36.0)
MCV: 82 fL (ref 78.0–100.0)
RDW: 15.3 % (ref 11.5–15.5)
RDW: 15.3 % (ref 11.5–15.5)

## 2012-09-27 LAB — SURGICAL PCR SCREEN
MRSA, PCR: NEGATIVE
Staphylococcus aureus: NEGATIVE

## 2012-09-27 LAB — PREPARE RBC (CROSSMATCH)

## 2012-09-27 LAB — IRON AND TIBC
Iron: 13 ug/dL — ABNORMAL LOW (ref 42–135)
Iron: 17 ug/dL — ABNORMAL LOW (ref 42–135)
Saturation Ratios: 12 % — ABNORMAL LOW (ref 20–55)
Saturation Ratios: 18 % — ABNORMAL LOW (ref 20–55)
TIBC: 113 ug/dL — ABNORMAL LOW (ref 215–435)
TIBC: 94 ug/dL — ABNORMAL LOW (ref 215–435)
UIBC: 100 ug/dL — ABNORMAL LOW (ref 125–400)
UIBC: 77 ug/dL — ABNORMAL LOW (ref 125–400)

## 2012-09-27 LAB — BASIC METABOLIC PANEL
BUN: 24 mg/dL — ABNORMAL HIGH (ref 6–23)
Chloride: 101 mEq/L (ref 96–112)
Creatinine, Ser: 1.98 mg/dL — ABNORMAL HIGH (ref 0.50–1.35)
GFR calc Af Amer: 40 mL/min — ABNORMAL LOW (ref >90)
Glucose, Bld: 173 mg/dL — ABNORMAL HIGH (ref 70–99)
Potassium: 3.8 mEq/L (ref 3.5–5.1)

## 2012-09-27 LAB — RETICULOCYTES: RBC.: 3.22 MIL/uL — ABNORMAL LOW (ref 4.22–5.81)

## 2012-09-27 LAB — VITAMIN B12: Vitamin B-12: 554 pg/mL (ref 211–911)

## 2012-09-27 LAB — MAGNESIUM: Magnesium: 2 mg/dL (ref 1.5–2.5)

## 2012-09-27 LAB — CREATININE, SERUM: GFR calc Af Amer: 36 mL/min — ABNORMAL LOW (ref >90)

## 2012-09-27 SURGERY — AMPUTATION BELOW KNEE
Anesthesia: General | Site: Leg Lower | Laterality: Left | Wound class: Clean

## 2012-09-27 MED ORDER — VANCOMYCIN HCL IN DEXTROSE 750-5 MG/150ML-% IV SOLN
750.0000 mg | Freq: Two times a day (BID) | INTRAVENOUS | Status: DC
  Administered 2012-09-27 – 2012-09-29 (×4): 750 mg via INTRAVENOUS
  Filled 2012-09-27 (×6): qty 150

## 2012-09-27 MED ORDER — MIDAZOLAM HCL 5 MG/5ML IJ SOLN
INTRAMUSCULAR | Status: DC | PRN
  Administered 2012-09-27: 2 mg via INTRAVENOUS

## 2012-09-27 MED ORDER — ALBUMIN HUMAN 5 % IV SOLN
INTRAVENOUS | Status: DC | PRN
  Administered 2012-09-27: 16:00:00 via INTRAVENOUS

## 2012-09-27 MED ORDER — PHENYLEPHRINE HCL 10 MG/ML IJ SOLN
INTRAMUSCULAR | Status: DC | PRN
  Administered 2012-09-27 (×7): 80 ug via INTRAVENOUS

## 2012-09-27 MED ORDER — OXYCODONE HCL 5 MG PO TABS
ORAL_TABLET | ORAL | Status: AC
  Filled 2012-09-27: qty 2

## 2012-09-27 MED ORDER — PROPOFOL 10 MG/ML IV BOLUS
INTRAVENOUS | Status: DC | PRN
  Administered 2012-09-27: 200 mg via INTRAVENOUS

## 2012-09-27 MED ORDER — ONDANSETRON HCL 4 MG/2ML IJ SOLN
INTRAMUSCULAR | Status: DC | PRN
  Administered 2012-09-27: 4 mg via INTRAVENOUS

## 2012-09-27 MED ORDER — OXYCODONE HCL 5 MG/5ML PO SOLN: 5.0000 mg | Freq: Once | ORAL | Status: DC | PRN

## 2012-09-27 MED ORDER — BIOTENE DRY MOUTH MT LIQD
15.0000 mL | Freq: Two times a day (BID) | OROMUCOSAL | Status: DC
  Administered 2012-09-27 – 2012-10-20 (×28): 15 mL via OROMUCOSAL

## 2012-09-27 MED ORDER — METOCLOPRAMIDE HCL 5 MG/ML IJ SOLN: 10.0000 mg | Freq: Once | INTRAMUSCULAR | Status: DC | PRN

## 2012-09-27 MED ORDER — SODIUM CHLORIDE 0.9 % IJ SOLN
10.0000 mL | INTRAMUSCULAR | Status: DC | PRN
  Administered 2012-09-28 – 2012-10-20 (×7): 10 mL

## 2012-09-27 MED ORDER — DEXTROSE 5 % IV SOLN
2.0000 g | INTRAVENOUS | Status: DC
  Filled 2012-09-27: qty 2

## 2012-09-27 MED ORDER — SUCCINYLCHOLINE CHLORIDE 20 MG/ML IJ SOLN
INTRAMUSCULAR | Status: DC | PRN
  Administered 2012-09-27: 120 mg via INTRAVENOUS

## 2012-09-27 MED ORDER — OXYCODONE HCL 5 MG PO TABS: 5.0000 mg | ORAL_TABLET | Freq: Once | ORAL | Status: DC | PRN

## 2012-09-27 MED ORDER — LACTATED RINGERS IV SOLN
INTRAVENOUS | Status: DC | PRN
  Administered 2012-09-27: 14:00:00 via INTRAVENOUS

## 2012-09-27 MED ORDER — LACTATED RINGERS IV SOLN
Freq: Once | INTRAVENOUS | Status: AC
  Administered 2012-09-27: 14:00:00 via INTRAVENOUS

## 2012-09-27 MED ORDER — METOCLOPRAMIDE HCL 5 MG/ML IJ SOLN
INTRAMUSCULAR | Status: DC | PRN
  Administered 2012-09-27: 10 mg via INTRAVENOUS

## 2012-09-27 MED ORDER — HYDROMORPHONE HCL PF 1 MG/ML IJ SOLN
0.2500 mg | INTRAMUSCULAR | Status: DC | PRN
  Administered 2012-09-27 (×2): 0.5 mg via INTRAVENOUS

## 2012-09-27 MED ORDER — SODIUM CHLORIDE 0.9 % IV SOLN
INTRAVENOUS | Status: AC
  Administered 2012-09-27 – 2012-09-28 (×2): via INTRAVENOUS

## 2012-09-27 MED ORDER — 0.9 % SODIUM CHLORIDE (POUR BTL) OPTIME
TOPICAL | Status: DC | PRN
  Administered 2012-09-27: 1000 mL

## 2012-09-27 MED ORDER — HYDROMORPHONE HCL PF 1 MG/ML IJ SOLN
INTRAMUSCULAR | Status: AC
  Filled 2012-09-27: qty 1

## 2012-09-27 MED ORDER — FENTANYL CITRATE 0.05 MG/ML IJ SOLN
INTRAMUSCULAR | Status: DC | PRN
  Administered 2012-09-27 (×2): 50 ug via INTRAVENOUS

## 2012-09-27 MED ORDER — ARTIFICIAL TEARS OP OINT
TOPICAL_OINTMENT | OPHTHALMIC | Status: DC | PRN
  Administered 2012-09-27: 1 via OPHTHALMIC

## 2012-09-27 SURGICAL SUPPLY — 52 items
BANDAGE ELASTIC 6 VELCRO ST LF (GAUZE/BANDAGES/DRESSINGS) ×2 IMPLANT
BANDAGE ESMARK 6X9 LF (GAUZE/BANDAGES/DRESSINGS) ×1 IMPLANT
BANDAGE GAUZE ELAST BULKY 4 IN (GAUZE/BANDAGES/DRESSINGS) ×2 IMPLANT
BLADE LONG MED 31X9 (MISCELLANEOUS) ×2 IMPLANT
BLADE SAW RECIP 87.9 MT (BLADE) ×2 IMPLANT
BNDG CMPR 9X6 STRL LF SNTH (GAUZE/BANDAGES/DRESSINGS) ×1
BNDG COHESIVE 6X5 TAN STRL LF (GAUZE/BANDAGES/DRESSINGS) ×2 IMPLANT
BNDG ESMARK 6X9 LF (GAUZE/BANDAGES/DRESSINGS) ×2
CLOTH BEACON ORANGE TIMEOUT ST (SAFETY) ×2 IMPLANT
COVER SURGICAL LIGHT HANDLE (MISCELLANEOUS) ×2 IMPLANT
CUFF TOURNIQUET SINGLE 34IN LL (TOURNIQUET CUFF) IMPLANT
CUFF TOURNIQUET SINGLE 44IN (TOURNIQUET CUFF) IMPLANT
DRAPE EXTREMITY T 121X128X90 (DRAPE) ×2 IMPLANT
DURAPREP 26ML APPLICATOR (WOUND CARE) ×2 IMPLANT
EVACUATOR 1/8 PVC DRAIN (DRAIN) ×2 IMPLANT
GAUZE XEROFORM 5X9 LF (GAUZE/BANDAGES/DRESSINGS) ×2 IMPLANT
GLOVE BIO SURGEON STRL SZ7.5 (GLOVE) ×2 IMPLANT
GLOVE BIO SURGEON STRL SZ8.5 (GLOVE) ×2 IMPLANT
GLOVE BIOGEL PI IND STRL 7.0 (GLOVE) ×1 IMPLANT
GLOVE BIOGEL PI IND STRL 8 (GLOVE) ×1 IMPLANT
GLOVE BIOGEL PI IND STRL 9 (GLOVE) ×1 IMPLANT
GLOVE BIOGEL PI INDICATOR 7.0 (GLOVE) ×1
GLOVE BIOGEL PI INDICATOR 8 (GLOVE) ×1
GLOVE BIOGEL PI INDICATOR 9 (GLOVE) ×1
GLOVE ECLIPSE 6.5 STRL STRAW (GLOVE) ×2 IMPLANT
GOWN PREVENTION PLUS XLARGE (GOWN DISPOSABLE) ×2 IMPLANT
GOWN PREVENTION PLUS XXLARGE (GOWN DISPOSABLE) ×2 IMPLANT
GOWN STRL NON-REIN LRG LVL3 (GOWN DISPOSABLE) ×4 IMPLANT
GOWN STRL REIN XL XLG (GOWN DISPOSABLE) ×2 IMPLANT
KIT BASIN OR (CUSTOM PROCEDURE TRAY) ×2 IMPLANT
KIT ROOM TURNOVER OR (KITS) ×2 IMPLANT
MANIFOLD NEPTUNE II (INSTRUMENTS) ×2 IMPLANT
NS IRRIG 1000ML POUR BTL (IV SOLUTION) ×2 IMPLANT
PACK GENERAL/GYN (CUSTOM PROCEDURE TRAY) ×2 IMPLANT
PAD ARMBOARD 7.5X6 YLW CONV (MISCELLANEOUS) ×4 IMPLANT
PAD CAST 4YDX4 CTTN HI CHSV (CAST SUPPLIES) ×1 IMPLANT
PADDING CAST COTTON 4X4 STRL (CAST SUPPLIES) ×2
SOLUTION BETADINE 4OZ (MISCELLANEOUS) ×2 IMPLANT
SPONGE GAUZE 4X4 12PLY (GAUZE/BANDAGES/DRESSINGS) ×2 IMPLANT
SPONGE SCRUB IODOPHOR (GAUZE/BANDAGES/DRESSINGS) ×2 IMPLANT
STAPLER VISISTAT (STAPLE) ×2 IMPLANT
STOCKINETTE IMPERVIOUS LG (DRAPES) ×2 IMPLANT
SUT ETHILON 3 0 PS 1 (SUTURE) ×4 IMPLANT
SUT SILK 1 TIES 10X30 (SUTURE) ×2 IMPLANT
SUT SILK 2 0 TIES 10X30 (SUTURE) ×2 IMPLANT
SUT VIC AB 0 CTB1 27 (SUTURE) ×2 IMPLANT
SUT VIC AB 1 CTB1 27 (SUTURE) ×4 IMPLANT
SUT VIC AB 2-0 CTB1 (SUTURE) ×2 IMPLANT
TOWEL OR 17X24 6PK STRL BLUE (TOWEL DISPOSABLE) ×2 IMPLANT
TOWEL OR 17X26 10 PK STRL BLUE (TOWEL DISPOSABLE) ×2 IMPLANT
UNDERPAD 30X30 INCONTINENT (UNDERPADS AND DIAPERS) ×2 IMPLANT
WATER STERILE IRR 1000ML POUR (IV SOLUTION) ×2 IMPLANT

## 2012-09-27 NOTE — Progress Notes (Signed)
Eric phillips pa with ortho made aware of hgb being 6.5 today (yesterday 7.5)

## 2012-09-27 NOTE — Anesthesia Postprocedure Evaluation (Signed)
Anesthesia Post Note  Patient: [name]  Procedure(s) Performed: Procedure(s) (LRB): AMPUTATION BELOW KNEE (Left)  Anesthesia type: General  Patient location: PACU  Post pain: Pain level controlled  Post assessment: Patient's Cardiovascular Status Stable  Last Vitals:  Filed Vitals:   09/27/12 1704  BP:   Pulse: 93  Temp: 38.1 C  Resp: 16    Post vital signs: Reviewed and stable  Level of consciousness: alert  Complications: No apparent anesthesia complications

## 2012-09-27 NOTE — OR Nursing (Signed)
Postop cbc drawn

## 2012-09-27 NOTE — Progress Notes (Signed)
PT Cancellation Note  Patient Details Name: [name] MRN: [id number] DOB: 07/14/1950   Cancelled Treatment:    Reason Eval/Treat Not Completed: Medical issues which prohibited therapy Pt to receive 2 units of PRBC and scheduled for L BKA later today.  Please re-order PT after surgery.  Thanks.   [name],[name] 09/27/2012, 8:33 AM [name], PT, DPT 09/27/2012 Pager: [phone]

## 2012-09-27 NOTE — Progress Notes (Signed)
Chaplain Note: pt in surgery when chaplain stopped by for visit and prayer.  Prayed for pt. Will follow up as needed.  Dorris Fetch Chaplain [phone]

## 2012-09-27 NOTE — Progress Notes (Signed)
Utilization review completed.  

## 2012-09-27 NOTE — Progress Notes (Signed)
[facility] PROGRESS NOTE  Kavi Unzicker [id number] DOB: November 21, 1960 DOA: 09/26/2012 PCP: No PCP Per Patient  Assessment/Plan: #1 fever  -Most likely secondary to left lower extremity wound/possible  -Urine cultures are pending. Wound culture ordered. Blood cultures x2 ordered.  -Broad spectrum antibiotics with Vanc and zosyn   #2 left lower extremity malodorous wounds/? Gangrenous lower extremity  - Ortho on board and we will await for further evaluation and recommendations. - Continue Broad spectrum antibiotics  #3 hypertension  - Stable. Continue home regimen of hydralazine, metoprolol.   #4 UTI  - Urine cultures are pending. IV Zosyn and vanc on board.   #5 well controlled type 2 diabetes  - Hemoglobin A1c was 5.9 on 09/06/2012. - Continue SSI - advance diet to diabetic diet and place orders for NPO this midnight.  #6 anemia  Likely anemia of chronic disease.  - transfuse 2 units of PRBC since hgb 6.5 after initial IVF hydration. Was 7.5 on initial check.  #7 leukocytosis  Likely secondary to problems # two and four. Blood cultures are pending. Urine cultures are pending. Chest x-ray is negative. Continue empiric IV Zosyn and vancomycin. Follow.   #8 history of CVA  On aspirin 325mg  daily.   #9 nausea and vomiting  - Resolved patient on reglan. - advance diet to diabetic diet. Place NPO for this midnight.  #10 chronic kidney disease  Baseline creatinine 1.2-1.4.  - Will reassess next am after fluid hydration - obtain renal ultrasound given worsening in creatinine  #11 prophylaxis  PPI for GI prophylaxis. Lovenox for DVT prophylaxis.   Code Status: Full  Family Communication: Updated patient no family at bedside.  Disposition Plan: Admit to [facility].  Consultants:  Ortho: Dr. Mayer Camel  Procedures:  None  Antibiotics:  Vancomycin and Zosyn  HPI/Subjective: No new complaints. No acute issues overnight reported to me by patient.   Objective: Filed  Vitals:   09/27/12 1227 09/27/12 1328 09/27/12 1600 09/27/12 1623  BP: 148/76 157/80    Pulse: 81 85  94  Temp: 99.8 F (37.7 C) 100.4 F (38 C) 97.6 F (36.4 C)   TempSrc: Oral Oral    Resp: 18 18  18   Height:      Weight:      SpO2:    100%    Intake/Output Summary (Last 24 hours) at 09/27/12 1627 Last data filed at 09/27/12 1559  Gross per 24 hour  Intake 2617.5 ml  Output      0 ml  Net 2617.5 ml   Filed Weights   09/26/12 2115  Weight: 86.6 kg (190 lb 14.7 oz)    Exam:   General:  Pt in NAD, Alert and awake  Cardiovascular: RRR, no MRG  Respiratory: CTA BL, no wheezes  Abdomen: soft, NT, ND  Musculoskeletal: Left foot in gauze, no active bleeding   Data Reviewed: Basic Metabolic Panel:  Recent Labs Lab 09/26/12 1421 09/27/12 0500  NA 138 140  K 3.7 3.8  CL 100 101  CO2 25 23  GLUCOSE 129* 173*  BUN 20 24*  CREATININE 1.68* 1.98*  CALCIUM 9.1 8.6  MG  --  2.0   Liver Function Tests:  Recent Labs Lab 09/26/12 1421  AST 23  ALT 20  ALKPHOS 203*  BILITOT 0.7  PROT 8.3  ALBUMIN 1.8*    Recent Labs Lab 09/26/12 1421  LIPASE 27   No results found for this basename: AMMONIA,  in the last 168 hours CBC:  Recent Labs  Lab 09/26/12 1421 09/27/12 0500  WBC 16.5* 18.5*  NEUTROABS 12.4*  --   HGB 7.5* 6.5*  HCT 23.6* 20.5*  MCV 82.2 82.0  PLT 374 319   Cardiac Enzymes: No results found for this basename: CKTOTAL, CKMB, CKMBINDEX, TROPONINI,  in the last 168 hours BNP (last 3 results) No results found for this basename: PROBNP,  in the last 8760 hours CBG:  Recent Labs Lab 09/27/12 0755 09/27/12 1123 09/27/12 1406 09/27/12 1622  GLUCAP 181* 200* 179* 211*    Recent Results (from the past 240 hour(s))  MRSA PCR SCREENING     Status: None   Collection Time    09/26/12  8:49 PM      Result Value Range Status   MRSA by PCR NEGATIVE  NEGATIVE Final   Comment:            The GeneXpert MRSA Assay (FDA     approved for NASAL  specimens     only), is one component of a     comprehensive MRSA colonization     surveillance program. It is not     intended to diagnose MRSA     infection nor to guide or     monitor treatment for     MRSA infections.  SURGICAL PCR SCREEN     Status: None   Collection Time    09/27/12 11:50 AM      Result Value Range Status   MRSA, PCR NEGATIVE  NEGATIVE Final   Staphylococcus aureus NEGATIVE  NEGATIVE Final   Comment:            The Xpert SA Assay (FDA     approved for NASAL specimens     in patients over 60 years of age),     is one component of     a comprehensive surveillance     program.  Test performance has     been validated by [employer] for patients greater     than or equal to 56 year old.     It is not intended     to diagnose infection nor to     guide or monitor treatment.     Studies: Dg Chest 2 View  09/26/2012   *RADIOLOGY REPORT*  Clinical Data: Chest pain.  CHEST - 2 VIEW  Comparison: 09/07/2012  Findings: A right upper extremity PICC line is present with the tip in the lower SVC.  The heart size is normal and stable with evidence of prior CABG.  Lungs show no evidence of edema, focal consolidation or nodule.  No pleural fluid is identified.  Bony thorax is unremarkable.  IMPRESSION: No active disease.   Original Report Authenticated By: Aletta Edouard, M.D.   Dg Femur Right  09/26/2012   *RADIOLOGY REPORT*  Clinical Data: Pain of the mid right femur.  No known trauma.  RIGHT FEMUR - 2 VIEW  Comparison: Right foot radiographs 07/27/2012  Findings: The joint space of the right hip is maintained.  No significant degenerative changes.  Bony mineralization appears normal.  No fracture or focal bony abnormality of the femur is seen.  Inferior patellar osteophyte noted.  On the lateral view, surgical resection of the distal aspect of the fibula is noted.  Only 6.5 cm of the proximal fibula are present.  There is mild subcutaneous stranding of the visualized  portion of the right lower extremity.  IMPRESSION:  1.  No acute bony abnormality the right  femur. 2. Below-the-knee amputation of the fibula noted.  The imaged portion of the proximal tibia continues below the edge of the image. 3.  Suspect mild diffuse subcutaneous edema.   Original Report Authenticated By: Curlene Dolphin, M.D.   Dg Knee Complete 4 Views Right  09/26/2012   *RADIOLOGY REPORT*  Clinical Data: History of painful right knee.  No new injury.  RIGHT KNEE - COMPLETE 4+ VIEW  Comparison: 09/24/2012 study.  Findings: The patient has undergone previous below-the-knee amputation.  Surgical clip is seen posterior to the proximal fibula on the lateral image.  There is degenerative spurring of the posterior inferior aspect of the patella.  No fracture or bony destruction is seen.  Joint spaces appear relatively well preserved. No chondrocalcinosis is evident.  IMPRESSION: No acute abnormality is identified.  Post below-the-knee amputation.  Degenerative spurring of patella.   Original Report Authenticated By: Shanon Brow Call    Scheduled Meds: . sodium chloride   Intravenous STAT  . antiseptic oral rinse  15 mL Mouth Rinse BID  . aspirin  325 mg Oral BID  . docusate sodium  100 mg Oral BID  . enoxaparin (LOVENOX) injection  40 mg Subcutaneous Q24H  . hydrALAZINE  50 mg Oral Q8H  . insulin aspart  0-9 Units Subcutaneous TID WC  . insulin detemir  15 Units Subcutaneous QHS  . metoCLOPramide  5 mg Oral TID AC  . metoprolol tartrate  25 mg Oral BID  . pantoprazole  40 mg Oral q morning - 10a  . piperacillin-tazobactam (ZOSYN)  IV  3.375 g Intravenous Q8H  . polyethylene glycol  17 g Oral Daily  . vancomycin  750 mg Intravenous Q12H   Continuous Infusions: . sodium chloride 75 mL/hr at 09/27/12 0201    Principal Problem:   Fever Active Problems:   Gangrene of foot   H/O: CVA (cerebrovascular accident)   Hypertension   Anemia   DM (diabetes mellitus) type 2, uncontrolled, with  ketoacidosis   Nausea with vomiting   UTI (lower urinary tract infection)   Wound eschar of foot    Time spent: > 35 minutes    Velvet Bathe  [facility] Pager [phone] If 7PM-7AM, please contact night-coverage at [email or web address], password [facility] 09/27/2012, 4:27 PM  LOS: 1 day

## 2012-09-27 NOTE — Anesthesia Procedure Notes (Signed)
Procedure Name: Intubation Date/Time: 09/27/2012 2:47 PM Performed by: Vaughan Browner Pre-anesthesia Checklist: Patient identified, Emergency Drugs available, Suction available and Patient being monitored Patient Re-evaluated:Patient Re-evaluated prior to inductionOxygen Delivery Method: Circle system utilized Preoxygenation: Pre-oxygenation with 100% oxygen Intubation Type: IV induction, Rapid sequence and Cricoid Pressure applied Laryngoscope Size: Mac and 3 Grade View: Grade I Tube size: 7.5 mm Number of attempts: 1 Airway Equipment and Method: Stylet Placement Confirmation: ETT inserted through vocal cords under direct vision,  positive ETCO2 and breath sounds checked- equal and bilateral Secured at: 23 cm Tube secured with: Tape Dental Injury: Teeth and Oropharynx as per pre-operative assessment

## 2012-09-27 NOTE — Progress Notes (Signed)
Pt arrived to 6E27 via stretcher from the ED. Family at bedside. A&Ox4. No distress noted. Pt appears to be very withdrawn and has a very flat affect. Pt had a right BKA recently - incision is healed, but stump is very dry, flaky, and has cracking with skin sloughing off. Pt's left foot has a large red area on top; there is no drainage and it does not appear wet, but it does have a very foul odor. The entire leg below his left knee is also dry and flaky, with cracking noted in several areas. Non-stick dressing and kerlix applied to this extremity as precaution. Pt oriented to unit and surroundings. Pt's personal wheelchair at bedside. Call bell within reach. Will continue to monitor. C.Zarius Furr, RN.

## 2012-09-27 NOTE — Anesthesia Preprocedure Evaluation (Addendum)
Anesthesia Evaluation  Patient identified by MRN, date of birth, ID band Patient awake    Reviewed: Allergy & Precautions, H&P , NPO status , Patient's Chart, lab work & pertinent test results, reviewed documented beta blocker date and time   Airway Mallampati: II TM Distance: >3 FB Neck ROM: full    Dental  (+) Dental Advisory Given and Poor Dentition   Pulmonary shortness of breath, Current Smoker,  breath sounds clear to auscultation        Cardiovascular hypertension, Pt. on medications and Pt. on home beta blockers + CAD, + Past MI, + CABG and + Peripheral Vascular Disease Rhythm:regular     Neuro/Psych CVA negative psych ROS   GI/Hepatic GERD-  Medicated and Controlled,(+)     substance abuse  alcohol use,   Endo/Other  diabetes, Insulin Dependent and Oral Hypoglycemic Agents  Renal/GU Renal InsufficiencyRenal disease  negative genitourinary   Musculoskeletal   Abdominal   Peds  Hematology negative hematology ROS (+) anemia ,   Anesthesia Other Findings See surgeon's H&P   Reproductive/Obstetrics negative OB ROS                         Anesthesia Physical Anesthesia Plan  ASA: IV  Anesthesia Plan: General   Post-op Pain Management:    Induction: Intravenous  Airway Management Planned: Oral ETT  Additional Equipment:   Intra-op Plan:   Post-operative Plan: Extubation in OR  Informed Consent: I have reviewed the patients History and Physical, chart, labs and discussed the procedure including the risks, benefits and alternatives for the proposed anesthesia with the patient or authorized representative who has indicated his/her understanding and acceptance.   Dental Advisory Given  Plan Discussed with: CRNA and Surgeon  Anesthesia Plan Comments:         Anesthesia Quick Evaluation

## 2012-09-27 NOTE — Progress Notes (Signed)
INITIAL NUTRITION ASSESSMENT  DOCUMENTATION CODES Per approved criteria  -Not Applicable   INTERVENTION: 1. When diet advanced, will add Glucerna shakes TID, 8 oz provides 220 kcal, 10 g protein.   NUTRITION DIAGNOSIS: Inadequate oral intake related to nausea and vomiting as evidenced by pt report.   Goal: Patient will meet >/=90% of estimated nutrition needs.  Monitor:  PO intake, diet advancement, weight, labs  Reason for Assessment: Malnutrition screening tool, score of 4  62 y.o. male  Admitting Dx: Fever  ASSESSMENT: Patient admitted with gangrene of foot. He is recently s/p right below knee amputation on 09/06/2012. He is scheduled for left below knee amputation today. History obtained from patient is unreliable as patient providing short answers. He reports not eating well for the last 3 days due to nausea and vomiting. However, it is unclear how appetite was prior to this. He states his usual weight is ~189 pounds.   Height: Ht Readings from Last 1 Encounters:  09/26/12 5' 10.87" (1.8 m)    Weight: Wt Readings from Last 1 Encounters:  09/26/12 190 lb 14.7 oz (86.6 kg)  Expected weight for BKA: 201 pounds  Ideal Body Weight: 160 pounds (adjusted for BKA)  % Ideal Body Weight: 119%  Wt Readings from Last 10 Encounters:  09/26/12 190 lb 14.7 oz (86.6 kg)  09/26/12 190 lb 14.7 oz (86.6 kg)  09/11/12 190 lb 14.4 oz (86.592 kg)  09/11/12 190 lb 14.4 oz (86.592 kg)  07/31/12 195 lb 15.8 oz (88.9 kg)  07/31/12 195 lb 15.8 oz (88.9 kg)  07/31/12 195 lb 15.8 oz (88.9 kg)  07/31/12 195 lb 15.8 oz (88.9 kg)   Usual Body Weight: 189 pounds  % Usual Body Weight: 100%  BMI:  Body mass index is 28.13 kg/(m^2), adjusted for BKA. Patient is overweight.    Estimated Nutritional Needs: Kcal: 2050-2200 kcal Protein: 110-125 g Flid: >2.5 L/day  Skin: Incision right leg, diabetic ulcer left heel  Diet Order: NPO  EDUCATION NEEDS: -No education needs identified at  this time   Intake/Output Summary (Last 24 hours) at 09/27/12 1300 Last data filed at 09/27/12 1112  Gross per 24 hour  Intake 1142.5 ml  Output      0 ml  Net 1142.5 ml    Last BM: PTA   Labs:   Recent Labs Lab 09/26/12 1421 09/27/12 0500  NA 138 140  K 3.7 3.8  CL 100 101  CO2 25 23  BUN 20 24*  CREATININE 1.68* 1.98*  CALCIUM 9.1 8.6  MG  --  2.0  GLUCOSE 129* 173*    CBG (last 3)   Recent Labs  09/27/12 0755 09/27/12 1123  GLUCAP 181* 200*    Scheduled Meds: . sodium chloride   Intravenous STAT  . antiseptic oral rinse  15 mL Mouth Rinse BID  . aspirin  325 mg Oral BID  . docusate sodium  100 mg Oral BID  . enoxaparin (LOVENOX) injection  40 mg Subcutaneous Q24H  . hydrALAZINE  50 mg Oral Q8H  . insulin aspart  0-9 Units Subcutaneous TID WC  . insulin detemir  15 Units Subcutaneous QHS  . metoCLOPramide  5 mg Oral TID AC  . metoprolol tartrate  25 mg Oral BID  . pantoprazole  40 mg Oral q morning - 10a  . piperacillin-tazobactam (ZOSYN)  IV  3.375 g Intravenous Q8H  . polyethylene glycol  17 g Oral Daily    Continuous Infusions: . sodium chloride 75 mL/hr  at 09/27/12 0201    Past Medical History  Diagnosis Date  . Hypertension   . 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 Myocardial infarction   . Hx of seasonal allergies   . GERD (gastroesophageal reflux disease)   . High cholesterol   . Exertional shortness of breath     "before my last OR; I'm fine now" (09/26/2012)  . Type II diabetes mellitus     Past Surgical History  Procedure Laterality Date  . 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 Amputation Right 07/29/2012    Procedure: AMPUTATION 3RD TOE;  Surgeon: Kerin Salen, MD;  Location: [facility];  Service: Orthopedics;  Laterality: Right;  right third toe amputation  . Amputation Right 08/02/2012    Procedure: right transmetatarsal amputation;  Surgeon: Kerin Salen, MD;  Location: [facility];  Service: Orthopedics;  Laterality: Right;  .  Cataract extraction w/ intraocular lens  implant, bilateral  ?2010  . Amputation Right 09/06/2012    Procedure: AMPUTATION BELOW KNEE;  Surgeon: Kerin Salen, MD;  Location: [facility];  Service: Orthopedics;  Laterality: Right;  . Peripherally inserted central catheter insertion Right 09/2012    upper arm   Larey Seat, RD, LDN Pager #: [place] Pager #: [phone]

## 2012-09-27 NOTE — Progress Notes (Signed)
ANTIBIOTIC CONSULT NOTE - INITIAL  Pharmacy Consult for vancoymcin and Zosyn Indication: left leg wound/gangrene  No Known Allergies  Patient Measurements: Height: 5' 10.87" (180 cm) Weight: 190 lb 14.7 oz (86.6 kg) IBW/kg (Calculated) : 74.99  Vital Signs: Temp: 99.8 F (37.7 C) (07/23 1227) Temp src: Oral (07/23 1227) BP: 148/76 mmHg (07/23 1227) Pulse Rate: 81 (07/23 1227) Intake/Output from previous day: 07/22 0701 - 07/23 0700 In: 892.5 [I.V.:732.5; IV Piggyback:160] Out: -  Intake/Output from this shift: Total I/O In: 250 [I.V.:250] Out: -   Labs:  Recent Labs  09/26/12 1421 09/27/12 0500  WBC 16.5* 18.5*  HGB 7.5* 6.5*  PLT 374 319  CREATININE 1.68* 1.98*   Estimated Creatinine Clearance: 41 ml/min (by C-G formula based on Cr of 1.98).  Microbiology: Recent Results (from the past 720 hour(s))  URINE CULTURE     Status: None   Collection Time    09/07/12  1:22 PM      Result Value Range Status   Specimen Description URINE, RANDOM   Final   Special Requests NONE   Final   Culture  Setup Time 09/07/2012 16:58   Final   Colony Count 9,000 COLONIES/ML   Final   Culture INSIGNIFICANT GROWTH   Final   Report Status 09/08/2012 FINAL   Final  MRSA PCR SCREENING     Status: None   Collection Time    09/26/12  8:49 PM      Result Value Range Status   MRSA by PCR NEGATIVE  NEGATIVE Final   Comment:            The GeneXpert MRSA Assay (FDA     approved for NASAL specimens     only), is one component of a     comprehensive MRSA colonization     surveillance program. It is not     intended to diagnose MRSA     infection nor to guide or     monitor treatment for     MRSA infections.    Medical History: Past Medical History  Diagnosis Date  . Hypertension   . 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 Myocardial infarction   . Hx of seasonal allergies   . GERD (gastroesophageal reflux disease)   . High cholesterol   . Exertional shortness of breath     "before  my last OR; I'm fine now" (09/26/2012)  . Type II diabetes mellitus    Assessment: 38 YOM s/p right third toe amputation as well as left-sided transmetatarsal amputation. Presented last evening with skin changes on his left leg along with malodorous discharge. Planned BKA 7/23. Patient was started on Zosyn and Cubicin last night in the ED- now changed to Zosyn + vancomycin. SCr 1.98, CrCl ~47mL/min. Note, patient did have a fever of 101.5 reported PTA as well as chills and several episodes of emesis x2 days indicating a more complication infection.  Goal of Therapy:  Vancomycin trough level 15-20 mcg/ml  Plan:  1. Continue Zosyn 3.375gm IV Q8h extended interval dosing 2. Start Vancomycin 750mg  IV Q12h 3. Will follow clinical course, cultures/sensitivities, LOT and renal function 4. Levels when clinically indicated  Santresa Levett D. Jadon Ressler, PharmD Clinical Pharmacist Pager: [phone] 09/27/2012 1:25 PM

## 2012-09-27 NOTE — Progress Notes (Signed)
Orthopedic Tech Progress Note Patient Details:  [name] Oct 05, 1950 [id number] Applied overhead frame to bed     Braulio Bosch 09/27/2012, 5:47 PM

## 2012-09-27 NOTE — Consult Note (Addendum)
WOC consult requested prior to ortho service involvement.  Plans for BKA surgery according to progress notes. Please refer to ortho service for assessment and plan of care; re-consult WOC if further assistance is needed.  Thank-you,  Julien Girt MSN, [place], [place], [place], [place]

## 2012-09-27 NOTE — Transfer of Care (Signed)
Immediate Anesthesia Transfer of Care Note  Patient: [name]  Procedure(s) Performed: Procedure(s): AMPUTATION BELOW KNEE (Left)  Patient Location: PACU  Anesthesia Type:General  Level of Consciousness: responds to stimulation  Airway & Oxygen Therapy: Patient Spontanous Breathing and Patient connected to nasal cannula oxygen  Post-op Assessment: Report given to PACU RN, Post -op Vital signs reviewed and stable and Patient moving all extremities  Post vital signs: Reviewed and stable  Complications: No apparent anesthesia complications

## 2012-09-27 NOTE — Op Note (Signed)
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 Denies similar findings to the left foot was malodorous discharge from multiple ulcers and a standing necrosis from the heel pad up to the ankle. He is failed conservative treatment with dressing changes and IV antibiotics. After discussing options risks and benefits he is consented to the left below knee amputation to preserve his life.  Procedure: Patient was on IV antibiotics prior to being taken to the operating room, he was identified by armband and taken to operating room 10 the appropriate anesthetic monitors were attached and general endotracheal anesthesia induced with the patient in supine position. The left lower [place] was prepped and draped in usual sterile fashion from just above the foot and a dressing to the high thigh. The foot itself is covered in a sealed stockinette. A sterile tourniquet was placed above the knee. Timeout procedure was performed with the leg elevated and the tourniquet inflated to 350 mm of mercury. We began again the operation by making a full-thickness fishmouth type incision one hand breath below the tibial tubercle with an elongated posterior flap through the skin subcutaneous tissue and fascia over the muscle. We stripped the periosteum off the distal tibia and 1 inch proximal to the skin incision cut the tibia with a saber saw her and smoothed off the sharp edges. Using the Bovie we then dissected through the muscle identified the posterior neurovascular bundles and individually tied off the  vein artery and nerve with #1 silk suture x2. Similarly we dissected through the muscle of the anterior lateral compartments and tied off the peroneal neurovascular bundle as well. 2 cm proximal to the tibial cut we then cut the fibula and then filleted the posterior muscle going down to the fascia of the gastrocsoleus at the skin incision level. The foot was then taken off of the field. The tourniquet was let down small bleeders identified and cauterized and then the fascia of the gastrocsoleus was repaired back to the anterior fascia using #1 Vicryl suture interrupted. The subcutaneous tissues closed with 2-0 Vicryl interrupted after first placing a deep medium Hemovac drain. The skin was closed with skin staples and a dressing of Xerofoam 4 x 4 dressing sponges Curlex and an Ace wrap applied. The patient was awakened extubated and taken to the recovery without difficulty.

## 2012-09-27 NOTE — Progress Notes (Signed)
OT Cancellation Note  Patient Details Name: [name] MRN: [id number] DOB: 1950-03-25   Cancelled Treatment:    Reason Eval/Treat Not Completed: Patient at procedure or test/ unavailable;Medical issues which prohibited therapy. Pt to receive 2 units of PRBC and scheduled for L BKA later today. Please re-order OT after surgery.    Emmit Alexanders [facility] 09/27/2012, 9:12 AM

## 2012-09-27 NOTE — Progress Notes (Signed)
This RN notified of critical Hgb 6.5 at [place]. Ortho MD had already seen the pt at 0700 and told him they would be transfusing 2 units of PRBC's prior to surgery today. Raymon Mutton, RN on day shift stated she will notify MD of critical value. C.Kairah Leoni, RN.

## 2012-09-28 ENCOUNTER — Encounter (HOSPITAL_COMMUNITY): Payer: Self-pay | Admitting: Orthopedic Surgery

## 2012-09-28 DIAGNOSIS — E1310 Other specified diabetes mellitus with ketoacidosis without coma: Secondary | ICD-10-CM

## 2012-09-28 LAB — GLUCOSE, CAPILLARY

## 2012-09-28 LAB — BASIC METABOLIC PANEL
BUN: 33 mg/dL — ABNORMAL HIGH (ref 6–23)
CO2: 25 mEq/L (ref 19–32)
Chloride: 102 mEq/L (ref 96–112)
Creatinine, Ser: 2.58 mg/dL — ABNORMAL HIGH (ref 0.50–1.35)
Glucose, Bld: 117 mg/dL — ABNORMAL HIGH (ref 70–99)

## 2012-09-28 LAB — FOLATE: Folate: 7 ng/mL

## 2012-09-28 MED ORDER — PROMETHAZINE HCL 25 MG/ML IJ SOLN
12.5000 mg | Freq: Three times a day (TID) | INTRAMUSCULAR | Status: DC | PRN
  Administered 2012-09-28 – 2012-10-10 (×15): 12.5 mg via INTRAVENOUS
  Filled 2012-09-28 (×17): qty 1

## 2012-09-28 NOTE — ED Provider Notes (Signed)
Medical screening examination/treatment/procedure(s) were conducted as a shared visit with non-physician practitioner(s) or resident  and myself.  I personally evaluated the patient during the encounter and agree with the findings and plan unless otherwise indicated.  Reassessed.  Concern for left LE infection/ PVD.  Spoke with hospitalist.  Ortho consult.  Admitted with abx/ cultures.    Mariea Clonts, MD 09/28/12 0110

## 2012-09-28 NOTE — ED Provider Notes (Signed)
Medical screening examination/treatment/procedure(s) were conducted as a shared visit with non-physician practitioner(s) and myself.  I personally evaluated the patient during the encounter   Julianne Rice, MD 09/28/12 [phone]

## 2012-09-28 NOTE — Evaluation (Signed)
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 Denies similar findings to the left foot was malodorous discharge from multiple ulcers and a standing necrosis from the heel pad up to the ankle. He is failed conservative treatment with dressing changes and IV antibiotics. After discussing options risks and benefits he is consented to the left below knee amputation to preserve his life.   Clinical Impression  Patient is s/p L BKA surgery resulting in functional limitations due to the deficits listed below (see PT Problem List).  Patient will benefit from skilled PT to increase their independence and safety with mobility to allow discharge back to SNF for rehab.  Pt transferred to recliner today with anterior-posterior transfer and required increased time and effort due to fatigue.  Pt would like to attempt lateral transfer next visit.     PT Assessment  Patient needs continued PT services    Follow Up Recommendations  SNF;Supervision/Assistance - 24 hour    Does the patient have the potential to tolerate intense rehabilitation      Barriers to Discharge        Equipment Recommendations  None recommended by PT    Recommendations for Other Services     Frequency Min 3X/week    Precautions / Restrictions Precautions Precautions: Fall Restrictions Other Position/Activity Restrictions: bil BKA   Pertinent Vitals/Pain 8/10 L LE pain, premedicated, RN aware      Mobility  Bed Mobility Bed Mobility: Supine to Sit Supine to Sit: 5: Supervision;HOB flat Details for Bed Mobility Assistance: verbal cues for technique, increased  time Transfers Transfers: Government social research officer Transfer: 4: Min assist;To lower surface Details for Transfer Assistance: assist for scooting hips backwards, increased time due to fatigue Ambulation/Gait Ambulation/Gait Assistance: Not tested (comment)    Exercises     PT Diagnosis: Generalized weakness;Acute pain  PT Problem List: Decreased strength;Decreased activity tolerance;Decreased mobility;Pain PT Treatment Interventions: DME instruction;Functional mobility training;Therapeutic activities;Therapeutic exercise;Patient/family education;Balance training;Neuromuscular re-education     PT Goals(Current goals can be found in the care plan section) Acute Rehab PT Goals PT Goal Formulation: With patient Time For Goal Achievement: 10/12/12 Potential to Achieve Goals: Good  Visit Information  Last PT Received On: 09/28/12 Assistance Needed: +1       Prior [place] expects to be discharged to:: Skilled nursing facility Prior Function Level of Independence: Needs assistance Comments: independent with assistive device prior to R BKA a month ago then went to rehab Communication Communication: No difficulties    Cognition  Cognition Arousal/Alertness: Awake/alert Behavior During Therapy: WFL for tasks assessed/performed Overall Cognitive Status: Within Functional Limits for tasks assessed    Extremity/Trunk Assessment Upper Extremity Assessment Upper Extremity Assessment: Generalized weakness Lower Extremity Assessment Lower Extremity Assessment: RLE deficits/detail;LLE deficits/detail RLE Deficits / Details: previous BKA, grossly 3/5 per functional observation, ace wrapped per pt request LLE Deficits / Details: new BKA, grossly 3/5 per functional observation   Balance    End of Session PT - End of Session Activity Tolerance: Patient limited by fatigue Patient left: in chair;with call bell/phone within reach Nurse  Communication: Mobility status (nsg tech aware ant-post transfer back to bed)  GP     [name],[name] 09/28/2012,  1:20 PM [name], PT, DPT 09/28/2012 Pager: [phone]

## 2012-09-28 NOTE — Evaluation (Signed)
Occupational Therapy Evaluation Patient Details Name: [name] MRN: [id number] DOB: 07/08/1950 Today's Date: 09/28/2012 Time: [id number] OT Time Calculation (min): 22 min  OT Assessment / Plan / Recommendation History of present illness It was noted that patient had presented from his orthopod's office, Dr. Damita Dunnings office to the ED. I spoke with Dr. Grandville Silos from Dr. Damita Dunnings office who was on call and was told that the hospitalist service had been called to admit this patient in preparation for surgery for amputation of the left lower extremity later on this week once patient's medical issues have been stabilized. Pt s/p Left below the knee amputation 09/27/12   Clinical Impression   Pt admitted with above with recent RLE BKA on 09/06/12. Pt currently with functional limitations due to the deficits listed below (see OT Problem List).  Pt will benefit from skilled OT to increase their safety and independence with ADL and functional mobility for ADL to facilitate discharge to venue listed below.       OT Assessment  Patient needs continued OT Services    Follow Up Recommendations  SNF    Barriers to Discharge Decreased caregiver support    Equipment Recommendations   (TBD at SNF)       Frequency  Min 2X/week    Precautions / Restrictions Precautions Precautions: Fall Restrictions Weight Bearing Restrictions: Yes LLE Weight Bearing: Non weight bearing Other Position/Activity Restrictions: bil BKA   Pertinent Vitals/Pain 8/10 LLE; repositioned and made RN aware--pain meds not due for one more hour    ADL  Eating/Feeding: Independent Where Assessed - Eating/Feeding: Bed level Grooming: Set up Where Assessed - Grooming: Supported sitting Upper Body Bathing: Set up Where Assessed - Upper Body Bathing: Supported sitting Lower Body Bathing: Moderate assistance Where Assessed - Lower Body Bathing: Lean right and/or left;Supported sitting Upper Body Dressing: Set up Where Assessed  - Upper Body Dressing: Supported sitting Lower Body Dressing: Moderate assistance Where Assessed - Lower Body Dressing: Lean right and/or left;Supported sitting Toilet Transfer: Minimal assistance Toilet Transfer Method: Anterior-posterior Toilet Transfer Equipment:  (bed to recliner) Toileting - Clothing Manipulation and Hygiene: Moderate assistance Where Assessed - Toileting Clothing Manipulation and Hygiene: Lean right and/or left Transfers/Ambulation Related to ADLs: min A anterior/posterior bed to recliner (built up)    OT Diagnosis: Generalized weakness;Acute pain  OT Problem List: Decreased strength;Decreased activity tolerance;Impaired balance (sitting and/or standing);Decreased knowledge of use of DME or AE;Pain OT Treatment Interventions: Self-care/ADL training;Balance training;DME and/or AE instruction;Therapeutic activities;Therapeutic exercise;Patient/family education   OT Goals(Current goals can be found in the care plan section) Acute Rehab OT Goals OT Goal Formulation: With patient Time For Goal Achievement: 10/12/12 Potential to Achieve Goals: Good ADL Goals Pt/caregiver will Perform Home Exercise Program: For increased strengthening;With Supervision, verbal cues required/provided  Visit Information  Last OT Received On: 09/28/12 Assistance Needed: +1 PT/OT Co-Evaluation/Treatment: Yes History of Present Illness: It was noted that patient had presented from his orthopod's office, Dr. Damita Dunnings office to the ED. I spoke with Dr. Grandville Silos from Dr. Damita Dunnings office who was on call and was told that the hospitalist service had been called to admit this patient in preparation for surgery for amputation of the left lower extremity later on this week once patient's medical issues have been stabilized. Pt s/p Left below the knee amputation 09/27/12       Prior [place] expects to be discharged to:: Skilled nursing facility Prior Function Level  of Independence: Needs assistance Comments: independent  with assistive device prior to R BKA a month ago then went to rehab Communication Communication: No difficulties         Vision/Perception Vision - History Patient Visual Report: No change from baseline   Cognition  Cognition Arousal/Alertness: Awake/alert Behavior During Therapy: WFL for tasks assessed/performed Overall Cognitive Status: Within Functional Limits for tasks assessed    Extremity/Trunk Assessment Upper Extremity Assessment Upper Extremity Assessment: Generalized weakness Lower Extremity Assessment Lower Extremity Assessment: RLE deficits/detail;LLE deficits/detail RLE Deficits / Details: previous BKA, grossly 3/5 per functional observation, ace wrapped per pt request LLE Deficits / Details: new BKA, grossly 3/5 per functional observation     Mobility Bed Mobility Bed Mobility: Supine to Sit Supine to Sit: 5: Supervision;HOB flat Details for Bed Mobility Assistance: verbal cues for technique, increased time Transfers Details for Transfer Assistance: assist for scooting hips backwards, increased time due to fatigue           End of Session OT - End of Session Activity Tolerance: Patient limited by fatigue Patient left: in chair Nurse Communication: Mobility status (NT--chair pad scoot anteriorly onto bed)       Almon Register 09/28/2012, 1:35 PM

## 2012-09-28 NOTE — Progress Notes (Signed)
[facility] PROGRESS NOTE  [name] [id number] DOB: 10-29-1950 DOA: 09/26/2012 PCP: No PCP Per Patient  Assessment/Plan: #1 fever  -Most likely secondary to left lower extremity wound, pt is s/p L BKA -Urine cultures are pending. Wound culture ordered. Blood cultures x2 ordered.   -Broad spectrum antibiotics with Vanc and zosyn   #2 left lower extremity malodorous wounds/? Gangrenous lower extremity  - Ortho on board and we will await for further evaluation and recommendations. - Continue Broad spectrum antibiotics  #3 hypertension  - Stable. Continue home regimen of hydralazine, metoprolol.   #4 UTI  - Urine cultures are pending. IV Zosyn and vanc on board.   #5 well controlled type 2 diabetes  - Hemoglobin A1c was 5.9 on 09/06/2012. - Continue SSI - advance diet to diabetic diet and place orders for NPO this midnight.  #6 anemia  Likely anemia of chronic disease.  - transfused 2 units of PRBC since hgb 6.5 initially. hgb at 8.8 after transfusion  #7 leukocytosis  Likely secondary to problems # two and four. Blood cultures are pending. Urine cultures are pending. Chest x-ray is negative. Continue empiric IV Zosyn and vancomycin. Follow.   #8 history of CVA  On aspirin 325mg  daily.   #9 nausea and vomiting  - Resolved patient on reglan. - advance diet to diabetic diet. Place NPO for this midnight.  #10 chronic kidney disease  Baseline creatinine 1.2-1.4.  - Will reassess next am after fluid hydration - Renal ultrasound reportedly showing no hydronephrosis, BL echogenic kidneys consistent with medical renal disease.   #11 prophylaxis  PPI for GI prophylaxis. Lovenox for DVT prophylaxis.   Code Status: Full  Family Communication: Updated patient no family at bedside.  Disposition Plan: Admit to [facility].  Consultants:  Ortho: Dr. Mayer Camel  Procedures:  None  Antibiotics:  Vancomycin and Zosyn  HPI/Subjective: No new complaints.  No acute issues  overnight.  Objective: Filed Vitals:   09/28/12 0355 09/28/12 0930 09/28/12 1222 09/28/12 1356  BP: 111/68 114/62  105/61  Pulse: 81 93  76  Temp: 99.5 F (37.5 C) 101.4 F (38.6 C) 99.2 F (37.3 C) 99.4 F (37.4 C)  TempSrc: Oral Oral Oral   Resp: 18 20  20   Height:      Weight:      SpO2: 100% 97%  98%    Intake/Output Summary (Last 24 hours) at 09/28/12 1459 Last data filed at 09/28/12 0826  Gross per 24 hour  Intake 2909.58 ml  Output    400 ml  Net 2509.58 ml   Filed Weights   09/26/12 2115  Weight: 86.6 kg (190 lb 14.7 oz)    Exam:   General:  Pt in NAD, Alert and awake  Cardiovascular: RRR, no MRG  Respiratory: CTA BL, no wheezes  Abdomen: soft, NT, ND  Musculoskeletal: Left and right AKA.   Data Reviewed: Basic Metabolic Panel:  Recent Labs Lab 09/26/12 1421 09/27/12 0500 09/27/12 1635 09/28/12 0525  NA 138 140  --  138  K 3.7 3.8  --  3.7  CL 100 101  --  102  CO2 25 23  --  25  GLUCOSE 129* 173*  --  117*  BUN 20 24*  --  33*  CREATININE 1.68* 1.98* 2.17* 2.58*  CALCIUM 9.1 8.6  --  8.1*  MG  --  2.0  --   --    Liver Function Tests:  Recent Labs Lab 09/26/12 1421  AST 23  ALT  20  ALKPHOS 203*  BILITOT 0.7  PROT 8.3  ALBUMIN 1.8*    Recent Labs Lab 09/26/12 1421  LIPASE 27   No results found for this basename: AMMONIA,  in the last 168 hours CBC:  Recent Labs Lab 09/26/12 1421 09/27/12 0500 09/27/12 1615  WBC 16.5* 18.5* 19.4*  NEUTROABS 12.4*  --   --   HGB 7.5* 6.5* 8.8*  HCT 23.6* 20.5* 26.7*  MCV 82.2 82.0 82.9  PLT 374 319 300   Cardiac Enzymes: No results found for this basename: CKTOTAL, CKMB, CKMBINDEX, TROPONINI,  in the last 168 hours BNP (last 3 results) No results found for this basename: PROBNP,  in the last 8760 hours CBG:  Recent Labs Lab 09/27/12 1622 09/27/12 1722 09/27/12 2152 09/28/12 0825 09/28/12 1113  GLUCAP 211* 225* 227* 86 72    Recent Results (from the past 240 hour(s))   CULTURE, BLOOD (ROUTINE X 2)     Status: None   Collection Time    09/26/12  7:14 PM      Result Value Range Status   Specimen Description BLOOD ARM LEFT   Final   Special Requests BOTTLES DRAWN AEROBIC ONLY 10CC   Final   Culture  Setup Time 09/27/2012 04:13   Final   Culture     Final   Value:        BLOOD CULTURE RECEIVED NO GROWTH TO DATE CULTURE WILL BE HELD FOR 5 DAYS BEFORE ISSUING A FINAL NEGATIVE REPORT   Report Status PENDING   Incomplete  CULTURE, BLOOD (ROUTINE X 2)     Status: None   Collection Time    09/26/12  7:34 PM      Result Value Range Status   Specimen Description BLOOD FOREARM LEFT   Final   Special Requests BOTTLES DRAWN AEROBIC ONLY 10CC   Final   Culture  Setup Time 09/27/2012 04:13   Final   Culture     Final   Value:        BLOOD CULTURE RECEIVED NO GROWTH TO DATE CULTURE WILL BE HELD FOR 5 DAYS BEFORE ISSUING A FINAL NEGATIVE REPORT   Report Status PENDING   Incomplete  MRSA PCR SCREENING     Status: None   Collection Time    09/26/12  8:49 PM      Result Value Range Status   MRSA by PCR NEGATIVE  NEGATIVE Final   Comment:            The GeneXpert MRSA Assay (FDA     approved for NASAL specimens     only), is one component of a     comprehensive MRSA colonization     surveillance program. It is not     intended to diagnose MRSA     infection nor to guide or     monitor treatment for     MRSA infections.  SURGICAL PCR SCREEN     Status: None   Collection Time    09/27/12 11:50 AM      Result Value Range Status   MRSA, PCR NEGATIVE  NEGATIVE Final   Staphylococcus aureus NEGATIVE  NEGATIVE Final   Comment:            The Xpert SA Assay (FDA     approved for NASAL specimens     in patients over 67 years of age),     is one component of     a comprehensive surveillance  program.  Test performance has     been validated by [facility] for patients greater     than or equal to 81 year old.     It is not intended     to diagnose  infection nor to     guide or monitor treatment.     Studies: Dg Chest 2 View  09/26/2012   *RADIOLOGY REPORT*  Clinical Data: Chest pain.  CHEST - 2 VIEW  Comparison: 09/07/2012  Findings: A right upper extremity PICC line is present with the tip in the lower SVC.  The heart size is normal and stable with evidence of prior CABG.  Lungs show no evidence of edema, focal consolidation or nodule.  No pleural fluid is identified.  Bony thorax is unremarkable.  IMPRESSION: No active disease.   Original Report Authenticated By: Aletta Edouard, M.D.   Dg Femur Right  09/26/2012   *RADIOLOGY REPORT*  Clinical Data: Pain of the mid right femur.  No known trauma.  RIGHT FEMUR - 2 VIEW  Comparison: Right foot radiographs 07/27/2012  Findings: The joint space of the right hip is maintained.  No significant degenerative changes.  Bony mineralization appears normal.  No fracture or focal bony abnormality of the femur is seen.  Inferior patellar osteophyte noted.  On the lateral view, surgical resection of the distal aspect of the fibula is noted.  Only 6.5 cm of the proximal fibula are present.  There is mild subcutaneous stranding of the visualized portion of the right lower extremity.  IMPRESSION:  1.  No acute bony abnormality the right femur. 2. Below-the-knee amputation of the fibula noted.  The imaged portion of the proximal tibia continues below the edge of the image. 3.  Suspect mild diffuse subcutaneous edema.   Original Report Authenticated By: Curlene Dolphin, M.D.   US Renal  09/27/2012   *RADIOLOGY REPORT*  Clinical Data: Progressive renal insufficiency.  Question obstruction.  RENAL/URINARY TRACT ULTRASOUND COMPLETE  Comparison:  Renal ultrasound 09/08/2012.  Findings:  Right Kidney:  The renal parenchyma is diffusely echogenic.  No focal abnormality or hydronephrosis is demonstrated.  Renal length 13.6 cm (previously 12.5 cm).  Left Kidney:  The renal parenchyma appears mildly echogenic without focal  abnormality.  There is no hydronephrosis.  Renal length is 12.4 cm (previously 12.3 cm).  Bladder:  Bilateral ureteral jets are noted.  There is some layering debris within the bladder lumen.  The prostate gland is noted to the mildly enlarged.  IMPRESSION:  1.  No hydronephrosis. 2.  Bilateral echogenic kidneys consistent with medical renal disease.  The right kidney currently measures larger than on the prior study from 3 weeks ago.  This difference could be technical. The possibility of acute tubular necrosis or other acute inflammatory process should be considered.   Original Report Authenticated By: Richardean Sale, M.D.   Dg Knee Complete 4 Views Right  09/26/2012   *RADIOLOGY REPORT*  Clinical Data: History of painful right knee.  No new injury.  RIGHT KNEE - COMPLETE 4+ VIEW  Comparison: 09/24/2012 study.  Findings: The patient has undergone previous below-the-knee amputation.  Surgical clip is seen posterior to the proximal fibula on the lateral image.  There is degenerative spurring of the posterior inferior aspect of the patella.  No fracture or bony destruction is seen.  Joint spaces appear relatively well preserved. No chondrocalcinosis is evident.  IMPRESSION: No acute abnormality is identified.  Post below-the-knee amputation.  Degenerative spurring of  patella.   Original Report Authenticated By: Shanon Brow Call    Scheduled Meds: . antiseptic oral rinse  15 mL Mouth Rinse BID  . aspirin  325 mg Oral BID  . docusate sodium  100 mg Oral BID  . enoxaparin (LOVENOX) injection  40 mg Subcutaneous Q24H  . hydrALAZINE  50 mg Oral Q8H  . insulin aspart  0-9 Units Subcutaneous TID WC  . insulin detemir  15 Units Subcutaneous QHS  . metoCLOPramide  5 mg Oral TID AC  . metoprolol tartrate  25 mg Oral BID  . pantoprazole  40 mg Oral q morning - 10a  . piperacillin-tazobactam (ZOSYN)  IV  3.375 g Intravenous Q8H  . polyethylene glycol  17 g Oral Daily  . vancomycin  750 mg Intravenous Q12H    Continuous Infusions: . sodium chloride 125 mL/hr at 09/28/12 0532    Principal Problem:   Gangrene of foot Left Active Problems:   H/O: CVA (cerebrovascular accident)   Hypertension   Anemia   DM (diabetes mellitus) type 2, uncontrolled, with ketoacidosis   Nausea with vomiting   Fever   UTI (lower urinary tract infection)   Wound eschar of foot    Time spent: > 35 minutes    Velvet Bathe  [facility] Pager [phone] If 7PM-7AM, please contact night-coverage at [email or web address], password [facility] 09/28/2012, 2:59 PM  LOS: 2 days

## 2012-09-28 NOTE — Progress Notes (Signed)
Patient ID: [name], male   DOB: 06/06/1950, 62 y.o.   MRN: [id number] Subjective:Patient reports feeling much better, and the last 6.  Since we having his left below-knee amputation yesterday.  After his 2 units of packed red cells preoperatively his hemoglobin Was 8.8.  In the PACU.He feels like he has more energy as well.  He reports no further nausea or vomiting.  Objective: Temperature is 101.4.  Other vital signs are stable the distal aspect of the dressing was soaked with blood and we change the dressing after removing the drain.  There was no active bleeding and the stump appeared to be clean and dry.  Assessment: Doing well after left below-the-knee amputation, right BKA is also doing well.  That was done a month ago.  Plan: Continue Lovenox for anticoagulation, hopefully over the next day or 2 his fever and high white count will normalize and he will be able to return to rehabilitation at [facility].  For now he will be bed to wheelchair, whether or not he can actually operate to below-knee prosthesis will be determined as his medical condition improves.

## 2012-09-29 LAB — GLUCOSE, CAPILLARY
Glucose-Capillary: 24 mg/dL — CL (ref 70–99)
Glucose-Capillary: 92 mg/dL (ref 70–99)
Glucose-Capillary: 117 mg/dL — ABNORMAL HIGH (ref 70–99)

## 2012-09-29 LAB — CBC
MCV: 79.7 fL (ref 78.0–100.0)
Platelets: 320 10*3/uL (ref 150–400)
RBC: 3 MIL/uL — ABNORMAL LOW (ref 4.22–5.81)
WBC: 17 10*3/uL — ABNORMAL HIGH (ref 4.0–10.5)

## 2012-09-29 LAB — URINE CULTURE

## 2012-09-29 LAB — URINALYSIS, ROUTINE W REFLEX MICROSCOPIC
Bilirubin Urine: NEGATIVE
Glucose, UA: NEGATIVE mg/dL
Specific Gravity, Urine: 1.012 (ref 1.005–1.030)
pH: 6 (ref 5.0–8.0)

## 2012-09-29 LAB — BASIC METABOLIC PANEL
CO2: 23 mEq/L (ref 19–32)
Chloride: 101 mEq/L (ref 96–112)
Creatinine, Ser: 3.7 mg/dL — ABNORMAL HIGH (ref 0.50–1.35)

## 2012-09-29 LAB — URINE MICROSCOPIC-ADD ON

## 2012-09-29 LAB — CREATININE, URINE, RANDOM
Creatinine, Urine: 56.84 mg/dL
Creatinine, Urine: 43.99 mg/dL

## 2012-09-29 LAB — SODIUM, URINE, RANDOM: Sodium, Ur: 37 mEq/L

## 2012-09-29 MED ORDER — METOPROLOL TARTRATE 25 MG PO TABS
25.0000 mg | ORAL_TABLET | Freq: Two times a day (BID) | ORAL | Status: DC
  Administered 2012-09-29 – 2012-10-06 (×15): 25 mg via ORAL
  Filled 2012-09-29 (×18): qty 1

## 2012-09-29 MED ORDER — DEXTROSE 50 % IV SOLN
INTRAVENOUS | Status: AC
  Administered 2012-09-29: 50 mL
  Filled 2012-09-29: qty 50

## 2012-09-29 MED ORDER — ENOXAPARIN SODIUM 30 MG/0.3ML ~~LOC~~ SOLN
30.0000 mg | SUBCUTANEOUS | Status: DC
  Administered 2012-09-29 – 2012-10-09 (×11): 30 mg via SUBCUTANEOUS
  Filled 2012-09-29 (×12): qty 0.3

## 2012-09-29 MED ORDER — INSULIN DETEMIR 100 UNIT/ML ~~LOC~~ SOLN
7.0000 [IU] | Freq: Every day | SUBCUTANEOUS | Status: DC
  Filled 2012-09-29: qty 0.07

## 2012-09-29 MED ORDER — GLUCERNA SHAKE PO LIQD
237.0000 mL | Freq: Three times a day (TID) | ORAL | Status: DC
  Administered 2012-09-29 – 2012-10-02 (×9): 237 mL via ORAL

## 2012-09-29 NOTE — Consult Note (Signed)
[name] is an 62 y.o. male referred by Dr Wendee Beavers   Chief Complaint: Acute on CKD3 HPI: 62 yo BM with hx CKD sec DM/htn admitted 7/22 with gangrenous LLE requiring amputation 7/23.  He had RLE amputated few weeks ago and had mild episode of ARF.  Scr baseline in upper 1's. On Adm Scr 1.68 and now increased to 3.7.  Hg dropped to 6.5 on 7/23 along with lowish BP of around 100.  Has been on vanco but no other nephrotoxins.  Renal US shows no hydro.  SPEP/IFE unremarkable from earlier in the month.  Past Medical History  Diagnosis Date  . Hypertension   . 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 Myocardial infarction   . Hx of seasonal allergies   . GERD (gastroesophageal reflux disease)   . High cholesterol   . Exertional shortness of breath     "before my last OR; I'm fine now" (09/26/2012)  . Type II diabetes mellitus     Past Surgical History  Procedure Laterality Date  . 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 Amputation Right 07/29/2012    Procedure: AMPUTATION 3RD TOE;  Surgeon: Kerin Salen, MD;  Location: [facility];  Service: Orthopedics;  Laterality: Right;  right third toe amputation  . Amputation Right 08/02/2012    Procedure: right transmetatarsal amputation;  Surgeon: Kerin Salen, MD;  Location: [facility];  Service: Orthopedics;  Laterality: Right;  . Cataract extraction w/ intraocular lens  implant, bilateral  ?2010  . Amputation Right 09/06/2012    Procedure: AMPUTATION BELOW KNEE;  Surgeon: Kerin Salen, MD;  Location: [facility];  Service: Orthopedics;  Laterality: Right;  . Peripherally inserted central catheter insertion Right 09/2012    upper arm  . Amputation Left 09/27/2012    Procedure: AMPUTATION BELOW KNEE;  Surgeon: Kerin Salen, MD;  Location: [facility];  Service: Orthopedics;  Laterality: Left;    History reviewed. No pertinent family history. neg for renal disease Social History:  reports that he has quit smoking. His smoking use included Cigarettes. He has a 2.4 pack-year  smoking history. He has never used smokeless tobacco. He reports that he does not drink alcohol or use illicit drugs.  Allergies: No Known Allergies  Medications Prior to Admission  Medication Sig Dispense Refill  . acetaminophen (TYLENOL) 325 MG tablet Take 2 tablets (650 mg total) by mouth every 6 (six) hours as needed.      Marland Kitchen aspirin 325 MG tablet Take 1 tablet (325 mg total) by mouth 2 (two) times daily.  30 tablet  0  . hydrALAZINE (APRESOLINE) 50 MG tablet Take 1 tablet (50 mg total) by mouth every 8 (eight) hours.      . insulin aspart (NOVOLOG) 100 UNIT/ML injection Inject 0-9 Units into the skin 3 (three) times daily with meals. CBG 70 - 120: 0 units CBG 121 - 150: 1 unit,  CBG 151 - 200: 2 units,  CBG 201 - 250: 3 units,  CBG 251 - 300: 5 units,  CBG 301 - 350: 7 units,  CBG 351 - 400: 9 units   CBG > 400: 9 units and notify your MD  1 vial  12  . insulin aspart (NOVOLOG) 100 UNIT/ML injection Inject 4 Units into the skin 3 (three) times daily with meals.  1 vial  12  . insulin detemir (LEVEMIR) 100 UNIT/ML injection Inject 0.15 mLs (15 Units total) into the skin  at bedtime.  10 mL  12  . metoCLOPramide (REGLAN) 5 MG tablet Take 1 tablet (5 mg total) by mouth 3 (three) times daily before meals.      . metoprolol tartrate (LOPRESSOR) 25 MG tablet Take 1 tablet (25 mg total) by mouth 2 (two) times daily.      . ondansetron (ZOFRAN) 4 MG tablet Take 1 tablet (4 mg total) by mouth every 6 (six) hours as needed for nausea.  20 tablet  0  . oxyCODONE (OXY IR/ROXICODONE) 5 MG immediate release tablet Take 1-2 tablets (5-10 mg total) by mouth every 4 (four) hours as needed.  60 tablet  0  . pantoprazole (PROTONIX) 40 MG tablet Take 1 tablet (40 mg total) by mouth daily at 6 (six) AM.      . polyethylene glycol (MIRALAX / GLYCOLAX) packet Take 17 g by mouth daily.  14 each  0  . sodium chloride 0.9 % injection 10-40 mLs by Intracatheter route as needed (flush).  5 mL    . sodium chloride 0.9 %  SOLN 500 mL with vancomycin 10 G SOLR 1,500 mg Inject 1,500 mg into the vein daily. Stop date on July 10th      . piperacillin-tazobactam (ZOSYN) 3.375 GM/50ML IVPB Inject 50 mLs (3.375 g total) into the vein every 8 (eight) hours. Stop on July10th  50 mL       Lab Results: UA: 7-10 wbc's 3-6 rbc's 100 protein  Recent Labs  09/27/12 0500 09/27/12 1615 09/29/12 0600  WBC 18.5* 19.4* 17.0*  HGB 6.5* 8.8* 8.4*  HCT 20.5* 26.7* 23.9*  PLT 319 300 320   BMET  Recent Labs  09/27/12 0500 09/27/12 1635 09/28/12 0525 09/29/12 0600  NA 140  --  138 138  K 3.8  --  3.7 3.6  CL 101  --  102 101  CO2 23  --  25 23  GLUCOSE 173*  --  117* 40*  BUN 24*  --  33* 39*  CREATININE 1.98* 2.17* 2.58* 3.70*  CALCIUM 8.6  --  8.1* 8.3*   LFT  Recent Labs  09/26/12 1421  PROT 8.3  ALBUMIN 1.8*  AST 23  ALT 20  ALKPHOS 203*  BILITOT 0.7   US Renal  09/27/2012   *RADIOLOGY REPORT*  Clinical Data: Progressive renal insufficiency.  Question obstruction.  RENAL/URINARY TRACT ULTRASOUND COMPLETE  Comparison:  Renal ultrasound 09/08/2012.  Findings:  Right Kidney:  The renal parenchyma is diffusely echogenic.  No focal abnormality or hydronephrosis is demonstrated.  Renal length 13.6 cm (previously 12.5 cm).  Left Kidney:  The renal parenchyma appears mildly echogenic without focal abnormality.  There is no hydronephrosis.  Renal length is 12.4 cm (previously 12.3 cm).  Bladder:  Bilateral ureteral jets are noted.  There is some layering debris within the bladder lumen.  The prostate gland is noted to the mildly enlarged.  IMPRESSION:  1.  No hydronephrosis. 2.  Bilateral echogenic kidneys consistent with medical renal disease.  The right kidney currently measures larger than on the prior study from 3 weeks ago.  This difference could be technical. The possibility of acute tubular necrosis or other acute inflammatory process should be considered.   Original Report Authenticated By: Richardean Sale,  M.D.    ROS: Blurry vision,chronic No sob No cp No abd pain Appetite marginal No dysuria Pain controlled  PHYSICAL EXAM: Blood pressure 131/57, pulse 70, temperature 98.2 F (36.8 C), temperature source Oral, resp. rate 20, height 5'  10.87" (1.8 m), weight 86.6 kg (190 lb 14.7 oz), SpO2 99.00%. HEENT: PERRLA EOMI NECK:no jvd LUNGS:clear CARDIAC:RRR wo MRG ABD:+ BS NTND EXT:Bil BKA wrapped in bandages NEURO:CNI  Motor intact OX3 no asterixis  Assessment: 1. Acute on CKD3 most likely sec to hemodynamic changes/ATN from hypotension and acute blood loss 2. SP Lt BKA 3. DM 4. HTN 5. Anemia  PLAN: 1. Check urine lytes 2. Dc antibiotics 3. Daily Scr 4. Discussed possibility of HD if renal fx cont to worsen.  He would do it if needed 5. Check PTH 6. Renal diet   Shaila Gilchrest T 09/29/2012, 11:13 AM

## 2012-09-29 NOTE — Progress Notes (Signed)
Post op Day 2  S:  Pt afebrile, alert and oriented.  Pt up in bed eating ice and solids and drinking fluids.  WBC at 17.0 trending down.  BUN and creatinine trending up.  O:  Pt's wound has moderate drainage, but not soaked through ace wrap. No signs of cellulitis.  No purulent discharge.  Skin intact. No odor.  Pt able to lift bilateral legs.  No thigh pain.    A:  1.  Post op Day 2 S/P Left BKA       2.  Concerns for kidney failure -Pt followed by renal/ internal medicine      3.   Fever- resolved  P:  Dressing changed and new sterile dressing applied.       Continue fluids and consider increasing per Internal medicines recommendations.        Monitor for fever and continue IV Vanc

## 2012-09-29 NOTE — Progress Notes (Signed)
Physical Therapy Treatment Patient Details Name: [name] MRN: [id number] DOB: Nov 22, 1950 Today's Date: 09/29/2012 Time: [id number] PT Time Calculation (min): 26 min  PT Assessment / Plan / Recommendation  History of Present Illness It was noted that patient had presented from his orthopod's office, Dr. Damita Dunnings office to the ED. I spoke with Dr. Grandville Silos from Dr. Damita Dunnings office who was on call and was told that the hospitalist service had been called to admit this patient in preparation for surgery for amputation of the left lower extremity later on this week once patient's medical issues have been stabilized. Pt s/p Left below the knee amputation 09/27/12      PT Comments   Pt performed lateral scoot from bed over to chair with increased time today and max verbal cues for technique, also performed exercises.   Follow Up Recommendations  SNF;Supervision/Assistance - 24 hour     Does the patient have the potential to tolerate intense rehabilitation     Barriers to Discharge        Equipment Recommendations  None recommended by PT    Recommendations for Other Services    Frequency     Progress towards PT Goals Progress towards PT goals: Progressing toward goals  Plan Current plan remains appropriate    Precautions / Restrictions Precautions Precautions: Fall Restrictions LLE Weight Bearing: Non weight bearing Other Position/Activity Restrictions: bil BKA   Pertinent Vitals/Pain Pt reports pain "alright" and aware to use call bell if needing pain meds    Mobility  Bed Mobility Bed Mobility: Supine to Sit Supine to Sit: 5: Supervision;HOB flat Details for Bed Mobility Assistance: verbal cues for technique, increased time Transfers Transfers: Lateral/Scoot Transfers Lateral/Scoot Transfers: From elevated surface;With armrests removed;4: Min assist Details for Transfer Assistance: increased time for scooting with max verbal cues for technique, difficulty with flat surface so  bed raised for elevated surface to lower surface Ambulation/Gait Ambulation/Gait Assistance: Not tested (comment)    Exercises General Exercises - Lower Extremity Quad Sets: AROM;Both;20 reps Short Arc Quad: AROM;Both;20 reps Hip ABduction/ADduction: AROM;Both;20 reps Straight Leg Raises: AROM;Both;20 reps   PT Diagnosis:    PT Problem List:   PT Treatment Interventions:     PT Goals (current goals can now be found in the care plan section)    Visit Information  Last PT Received On: 09/29/12 Assistance Needed: +1 History of Present Illness: It was noted that patient had presented from his orthopod's office, Dr. Damita Dunnings office to the ED. I spoke with Dr. Grandville Silos from Dr. Damita Dunnings office who was on call and was told that the hospitalist service had been called to admit this patient in preparation for surgery for amputation of the left lower extremity later on this week once patient's medical issues have been stabilized. Pt s/p Left below the knee amputation 09/27/12    Subjective Data      Cognition  Cognition Arousal/Alertness: Awake/alert Behavior During Therapy: WFL for tasks assessed/performed Overall Cognitive Status: Within Functional Limits for tasks assessed    Balance     End of Session PT - End of Session Activity Tolerance: Patient limited by fatigue Patient left: in chair;with call bell/phone within reach   GP     [name],KATHrine E 09/29/2012, 11:58 AM [name], PT, DPT 09/29/2012 Pager: [phone]

## 2012-09-29 NOTE — Progress Notes (Signed)
[facility] PROGRESS NOTE  [name] [id number] DOB: 1950/03/11 DOA: 09/26/2012 PCP: No PCP Per Patient  Assessment/Plan: #1 fever  -Most likely secondary to left lower extremity wound, pt is s/p L BKA -Urine cultures are pending. Wound culture ordered. Blood cultures x2 ordered.   -Broad spectrum antibiotics with Vanc and zosyn started initially.  Will discontinue   #2 left lower extremity malodorous wounds/? Gangrenous lower extremity  - Ortho on board and we will await for further evaluation and recommendations. - Pt is s/p L BKA  #3 hypertension  - Stable. Continue home regimen of hydralazine, metoprolol.   #4 UTI  - Patient had > 3 days of antibiotics - As such no more antibiotics warranted currently.  #5 well controlled type 2 diabetes  - Hemoglobin A1c was 5.9 on 09/06/2012. - Continue SSI - advance diet to diabetic diet and place orders for NPO this midnight.  #6 anemia  Likely anemia of chronic disease.  - transfused 2 units of PRBC since hgb 6.5 initially.  - hgb stable currently  #7 leukocytosis  Likely secondary to problems # two and four. Blood cultures are pending. Urine cultures reviewed. Chest x-ray is negative. Continue empiric IV Zosyn and vancomycin. Follow.   #8 history of CVA  On aspirin 325mg  daily.   #9 nausea and vomiting  - Resolved patient on reglan. - advance diet to diabetic diet.   #10 chronic kidney disease  Baseline creatinine 1.2-1.4.  - Renal ultrasound reportedly showing no hydronephrosis, BL echogenic kidneys consistent with medical renal disease.  - consulted nephrology as creatinine worsening.  - FeNa 1.7% seems like intrinsic kidney disease.  #11 prophylaxis  PPI for GI prophylaxis. Lovenox for DVT prophylaxis.   Code Status: Full  Family Communication: Updated patient no family at bedside.  Disposition Plan: With improvement in WBC levels and S creatinine   Consultants:  Ortho: Dr. Mayer Camel  - Nephrology Dr.  Mercy Moore  Procedures:  None  Antibiotics:  Vancomycin and Zosyn d/c 7/25  HPI/Subjective: No new complaints.  No acute issues overnight.  Objective: Filed Vitals:   09/29/12 0500 09/29/12 0935 09/29/12 1400 09/29/12 1516  BP: 115/66 131/57 149/68 116/66  Pulse: 84 70 86 74  Temp: 98.4 F (36.9 C) 98.2 F (36.8 C) 97.5 F (36.4 C)   TempSrc: Oral Oral Oral   Resp: 21 20 20    Height:      Weight:      SpO2: 100% 99% 100%     Intake/Output Summary (Last 24 hours) at 09/29/12 1703 Last data filed at 09/29/12 0900  Gross per 24 hour  Intake    690 ml  Output    150 ml  Net    540 ml   Filed Weights   09/26/12 2115 09/28/12 2057  Weight: 86.6 kg (190 lb 14.7 oz) 86.6 kg (190 lb 14.7 oz)    Exam:   General:  Pt in NAD, Alert and awake  Cardiovascular: RRR, no MRG  Respiratory: CTA BL, no wheezes  Abdomen: soft, NT, ND  Musculoskeletal: Left and right BKA.   Data Reviewed: Basic Metabolic Panel:  Recent Labs Lab 09/26/12 1421 09/27/12 0500 09/27/12 1635 09/28/12 0525 09/29/12 0600  NA 138 140  --  138 138  K 3.7 3.8  --  3.7 3.6  CL 100 101  --  102 101  CO2 25 23  --  25 23  GLUCOSE 129* 173*  --  117* 40*  BUN 20 24*  --  33* 39*  CREATININE 1.68* 1.98* 2.17* 2.58* 3.70*  CALCIUM 9.1 8.6  --  8.1* 8.3*  MG  --  2.0  --   --   --    Liver Function Tests:  Recent Labs Lab 09/26/12 1421  AST 23  ALT 20  ALKPHOS 203*  BILITOT 0.7  PROT 8.3  ALBUMIN 1.8*    Recent Labs Lab 09/26/12 1421  LIPASE 27   No results found for this basename: AMMONIA,  in the last 168 hours CBC:  Recent Labs Lab 09/26/12 1421 09/27/12 0500 09/27/12 1615 09/29/12 0600  WBC 16.5* 18.5* 19.4* 17.0*  NEUTROABS 12.4*  --   --   --   HGB 7.5* 6.5* 8.8* 8.4*  HCT 23.6* 20.5* 26.7* 23.9*  MCV 82.2 82.0 82.9 79.7  PLT 374 319 300 320   Cardiac Enzymes: No results found for this basename: CKTOTAL, CKMB, CKMBINDEX, TROPONINI,  in the last 168 hours BNP  (last 3 results) No results found for this basename: PROBNP,  in the last 8760 hours CBG:  Recent Labs Lab 09/28/12 1710 09/28/12 2101 09/29/12 0756 09/29/12 0835 09/29/12 1129  GLUCAP 87 103* 24* 97 92    Recent Results (from the past 240 hour(s))  CULTURE, BLOOD (ROUTINE X 2)     Status: None   Collection Time    09/26/12  7:14 PM      Result Value Range Status   Specimen Description BLOOD ARM LEFT   Final   Special Requests BOTTLES DRAWN AEROBIC ONLY 10CC   Final   Culture  Setup Time 09/27/2012 04:13   Final   Culture     Final   Value:        BLOOD CULTURE RECEIVED NO GROWTH TO DATE CULTURE WILL BE HELD FOR 5 DAYS BEFORE ISSUING A FINAL NEGATIVE REPORT   Report Status PENDING   Incomplete  CULTURE, BLOOD (ROUTINE X 2)     Status: None   Collection Time    09/26/12  7:34 PM      Result Value Range Status   Specimen Description BLOOD FOREARM LEFT   Final   Special Requests BOTTLES DRAWN AEROBIC ONLY 10CC   Final   Culture  Setup Time 09/27/2012 04:13   Final   Culture     Final   Value:        BLOOD CULTURE RECEIVED NO GROWTH TO DATE CULTURE WILL BE HELD FOR 5 DAYS BEFORE ISSUING A FINAL NEGATIVE REPORT   Report Status PENDING   Incomplete  MRSA PCR SCREENING     Status: None   Collection Time    09/26/12  8:49 PM      Result Value Range Status   MRSA by PCR NEGATIVE  NEGATIVE Final   Comment:            The GeneXpert MRSA Assay (FDA     approved for NASAL specimens     only), is one component of a     comprehensive MRSA colonization     surveillance program. It is not     intended to diagnose MRSA     infection nor to guide or     monitor treatment for     MRSA infections.  SURGICAL PCR SCREEN     Status: None   Collection Time    09/27/12 11:50 AM      Result Value Range Status   MRSA, PCR NEGATIVE  NEGATIVE Final   Staphylococcus aureus NEGATIVE  NEGATIVE Final   Comment:            The Xpert SA Assay (FDA     approved for NASAL specimens     in  patients over 23 years of age),     is one component of     a comprehensive surveillance     program.  Test performance has     been validated by [employer] for patients greater     than or equal to 78 year old.     It is not intended     to diagnose infection nor to     guide or monitor treatment.  URINE CULTURE     Status: None   Collection Time    09/28/12  6:41 AM      Result Value Range Status   Specimen Description URINE, RANDOM   Final   Special Requests NONE   Final   Culture  Setup Time 09/28/2012 10:37   Final   Colony Count >=100,000 COLONIES/ML   Final   Culture     Final   Value: Multiple bacterial morphotypes present, none predominant. Suggest appropriate recollection if clinically indicated.   Report Status 09/29/2012 FINAL   Final     Studies: US Renal  09/27/2012   *RADIOLOGY REPORT*  Clinical Data: Progressive renal insufficiency.  Question obstruction.  RENAL/URINARY TRACT ULTRASOUND COMPLETE  Comparison:  Renal ultrasound 09/08/2012.  Findings:  Right Kidney:  The renal parenchyma is diffusely echogenic.  No focal abnormality or hydronephrosis is demonstrated.  Renal length 13.6 cm (previously 12.5 cm).  Left Kidney:  The renal parenchyma appears mildly echogenic without focal abnormality.  There is no hydronephrosis.  Renal length is 12.4 cm (previously 12.3 cm).  Bladder:  Bilateral ureteral jets are noted.  There is some layering debris within the bladder lumen.  The prostate gland is noted to the mildly enlarged.  IMPRESSION:  1.  No hydronephrosis. 2.  Bilateral echogenic kidneys consistent with medical renal disease.  The right kidney currently measures larger than on the prior study from 3 weeks ago.  This difference could be technical. The possibility of acute tubular necrosis or other acute inflammatory process should be considered.   Original Report Authenticated By: Richardean Sale, M.D.    Scheduled Meds: . antiseptic oral rinse  15 mL Mouth Rinse BID   . aspirin  325 mg Oral BID  . docusate sodium  100 mg Oral BID  . enoxaparin (LOVENOX) injection  30 mg Subcutaneous Q24H  . feeding supplement  237 mL Oral TID BM  . hydrALAZINE  50 mg Oral Q8H  . insulin aspart  0-9 Units Subcutaneous TID WC  . metoCLOPramide  5 mg Oral TID AC  . metoprolol tartrate  25 mg Oral BID  . pantoprazole  40 mg Oral q morning - 10a  . polyethylene glycol  17 g Oral Daily   Continuous Infusions:    Principal Problem:   Gangrene of foot Left Active Problems:   H/O: CVA (cerebrovascular accident)   Hypertension   Anemia   DM (diabetes mellitus) type 2, uncontrolled, with ketoacidosis   Nausea with vomiting   Fever   UTI (lower urinary tract infection)   Wound eschar of foot    Time spent: > 35 minutes    Velvet Bathe  [facility] Pager [phone] If 7PM-7AM, please contact night-coverage at [email or web address], password [facility] 09/29/2012, 5:03 PM  LOS: 3 days

## 2012-09-29 NOTE — Progress Notes (Signed)
CBG: 40 and 24 Treatment: D50 IV 50 mL  Symptoms: Sweaty and Shaky  Follow-up CBG: Time:835 CBG Result:97  Possible Reasons for Event: Inadequate meal intake  Comments/MD notified:vega

## 2012-09-30 DIAGNOSIS — L97509 Non-pressure chronic ulcer of other part of unspecified foot with unspecified severity: Secondary | ICD-10-CM

## 2012-09-30 DIAGNOSIS — F1010 Alcohol abuse, uncomplicated: Secondary | ICD-10-CM

## 2012-09-30 LAB — RENAL FUNCTION PANEL
BUN: 43 mg/dL — ABNORMAL HIGH (ref 6–23)
CO2: 22 mEq/L (ref 19–32)
Calcium: 8 mg/dL — ABNORMAL LOW (ref 8.4–10.5)
Creatinine, Ser: 4.07 mg/dL — ABNORMAL HIGH (ref 0.50–1.35)
GFR calc non Af Amer: 14 mL/min — ABNORMAL LOW (ref >90)
Glucose, Bld: 129 mg/dL — ABNORMAL HIGH (ref 70–99)

## 2012-09-30 LAB — GLUCOSE, CAPILLARY
Glucose-Capillary: 115 mg/dL — ABNORMAL HIGH (ref 70–99)
Glucose-Capillary: 124 mg/dL — ABNORMAL HIGH (ref 70–99)
Glucose-Capillary: 195 mg/dL — ABNORMAL HIGH (ref 70–99)

## 2012-09-30 LAB — URINE CULTURE

## 2012-09-30 LAB — CBC
HCT: 23.2 % — ABNORMAL LOW (ref 39.0–52.0)
Hemoglobin: 8 g/dL — ABNORMAL LOW (ref 13.0–17.0)
MCH: 27.1 pg (ref 26.0–34.0)
MCHC: 34.5 g/dL (ref 30.0–36.0)
MCV: 78.6 fL (ref 78.0–100.0)
RBC: 2.95 MIL/uL — ABNORMAL LOW (ref 4.22–5.81)

## 2012-09-30 MED ORDER — SODIUM CHLORIDE 0.9 % IV SOLN
INTRAVENOUS | Status: DC
  Administered 2012-09-30 – 2012-10-10 (×11): via INTRAVENOUS

## 2012-09-30 MED ORDER — NEPRO/CARBSTEADY PO LIQD
237.0000 mL | Freq: Two times a day (BID) | ORAL | Status: DC
  Administered 2012-09-30 – 2012-10-03 (×5): 237 mL via ORAL

## 2012-09-30 MED ORDER — SODIUM CHLORIDE 0.9 % IV SOLN
1020.0000 mg | Freq: Once | INTRAVENOUS | Status: AC
  Administered 2012-09-30: 1020 mg via INTRAVENOUS
  Filled 2012-09-30: qty 34

## 2012-09-30 NOTE — Progress Notes (Signed)
Post op Day 3 from L BKA  S: Patient afebrile. Alert and oriented x3. Pain well-controlled. Patient sitting up in bed eating and taking sips.  Patient's white blood cell count continues to trend down.  It does appear the patient's kidney function is worsening possibly due to hemodynamic changes.  O:  Patient's left lower extremity dressing does have moderate drainage.  Dressing is changed andwound appears to be healing.  No signs of cellulitis no signs of purulent drainage.  No odor.  Patient continues to have no thigh pain and can lift bilateral legs.  A:  1.  Postop day 3 is status post left BKA       2.  Fever resolved       3.   Kidney failure patient followed by the renal/internal medicine  P:  Dressing change and new dressing applied.       1 staple remains in right BKA, plan to remove tomorrow.       Patient's IV Vanc and Zosyn were DC'd by internal medicine       Patient may be discharged from an orthopedic standpoint once he is cleared by nephrology and internal medicine.        Plan followup in approximately 10-14 days in office to remove the staples from left BKA.  Return sooner if needed.  Call with any issues.

## 2012-09-30 NOTE — Progress Notes (Signed)
S:Up in chair.  Appetite poor  No dysuria O:BP 120/53  Pulse 73  Temp(Src) 99.1 F (37.3 C) (Oral)  Resp 17  Ht 5' 10.87" (1.8 m)  Wt 75.4 kg (166 lb 3.6 oz)  BMI 23.27 kg/m2  SpO2 99%  Intake/Output Summary (Last 24 hours) at 09/30/12 1333 Last data filed at 09/30/12 0845  Gross per 24 hour  Intake 188.67 ml  Output    500 ml  Net -311.33 ml   Weight change: -11.2 kg (-24 lb 11.1 oz) IN:[id number] and alert CVS:RRR Resp:clear Abd:+ BS NTND Ext:bil BKA NEURO:CNI OX3 no asterixis   . antiseptic oral rinse  15 mL Mouth Rinse BID  . aspirin  325 mg Oral BID  . docusate sodium  100 mg Oral BID  . enoxaparin (LOVENOX) injection  30 mg Subcutaneous Q24H  . feeding supplement  237 mL Oral TID BM  . hydrALAZINE  50 mg Oral Q8H  . insulin aspart  0-9 Units Subcutaneous TID WC  . metoCLOPramide  5 mg Oral TID AC  . metoprolol tartrate  25 mg Oral BID  . pantoprazole  40 mg Oral q morning - 10a  . polyethylene glycol  17 g Oral Daily   No results found. BMET    Component Value Date/Time   NA 133* 09/30/2012 0456   K 3.9 09/30/2012 0456   CL 97 09/30/2012 0456   CO2 22 09/30/2012 0456   GLUCOSE 129* 09/30/2012 0456   BUN 43* 09/30/2012 0456   CREATININE 4.07* 09/30/2012 0456   CALCIUM 8.0* 09/30/2012 0456   GFRNONAA 14* 09/30/2012 0456   GFRAA 17* 09/30/2012 0456   CBC    Component Value Date/Time   WBC 14.7* 09/30/2012 0456   RBC 2.95* 09/30/2012 0456   HGB 8.0* 09/30/2012 0456   HCT 23.2* 09/30/2012 0456   PLT 319 09/30/2012 0456   MCV 78.6 09/30/2012 0456   MCH 27.1 09/30/2012 0456   MCHC 34.5 09/30/2012 0456   RDW 15.9* 09/30/2012 0456   LYMPHSABS 2.3 09/26/2012 1421   MONOABS 1.8* 09/26/2012 1421   EOSABS 0.0 09/26/2012 1421   BASOSABS 0.0 09/26/2012 1421     Assessment: 1. Acute on CKD, Scr still rising.  There is TNTC wbc's in urine with urine culture pending 2. SP Lt BKA 3. DM 4. HTN 5. anemia  Plan: 1. Check urine eosinophils 2. Nepro BID 3 recheck Scr in AM 4.  He has been anemic for some time, will try aranesp and give dose of IV iron    Imanol Bihl T

## 2012-09-30 NOTE — Progress Notes (Signed)
[facility] PROGRESS NOTE  [name] [id number] DOB: 1950-12-02 DOA: 09/26/2012 PCP: No PCP Per Patient  Assessment/Plan: #1 fever  -Most likely secondary to left lower extremity wound, pt is s/p L BKA -Urine cultures are pending. Wound culture ordered. Blood cultures x2 ordered and NTD.   -Broad spectrum antibiotics with Vanc and zosyn started initially.  Will Discontinued after L BKA  #2 left lower extremity malodorous wounds/? Gangrenous lower extremity  - Ortho on board and we will await for further evaluation and recommendations. - Pt is s/p L BKA  #3 hypertension  - Stable. Continue home regimen of hydralazine, metoprolol.   #4 UTI  - Patient had > 3 days of antibiotics - As such no more antibiotics warranted currently.  #5 well controlled type 2 diabetes  - Hemoglobin A1c was 5.9 on 09/06/2012. - Continue SSI - advance diet to diabetic diet and place orders for NPO this midnight.  #6 anemia  Likely anemia of chronic disease.  - transfused 2 units of PRBC since hgb 6.5 initially.  - hgb trending down.  Will reassess next am.  #7 leukocytosis  Likely secondary to problems # two and four. Blood cultures are pending. Urine cultures reviewed. Chest x-ray is negative. Continue empiric IV Zosyn and vancomycin. Follow.   #8 history of CVA  On aspirin 325mg  daily.   #9 nausea and vomiting  - Resolved patient on reglan. - advance diet to diabetic diet.   #10 Acute on chronic kidney disease  Baseline creatinine 1.2-1.4.  - Renal ultrasound reportedly showing no hydronephrosis, BL echogenic kidneys consistent with medical renal disease.  - Nephrology on board and making further evaluation and recommendations. - FeNa 1.7% seems like intrinsic kidney disease.  #11 prophylaxis  PPI for GI prophylaxis. Lovenox for DVT prophylaxis.   Code Status: Full  Family Communication: patient and family at bedside. Disposition Plan: With improvement in WBC levels and S  creatinine   Consultants:  Ortho: Dr. Mayer Camel  Nephrology Dr. Mercy Moore  Procedures:  None  Antibiotics:  Vancomycin and Zosyn d/c 7/25  HPI/Subjective: No new complaints.  No acute issues overnight.  Objective: Filed Vitals:   09/29/12 2114 09/30/12 0532 09/30/12 0844 09/30/12 1411  BP: 125/60 123/57 120/53 116/56  Pulse: 91 85 73 81  Temp: 100.2 F (37.9 C) 99.2 F (37.3 C) 99.1 F (37.3 C) 98.6 F (37 C)  TempSrc: Oral Oral    Resp: 20 20 17 17   Height:      Weight: 75.4 kg (166 lb 3.6 oz)     SpO2: 97% 99% 99% 100%    Intake/Output Summary (Last 24 hours) at 09/30/12 1559 Last data filed at 09/30/12 1411  Gross per 24 hour  Intake 288.67 ml  Output    500 ml  Net -211.33 ml   Filed Weights   09/26/12 2115 09/28/12 2057 09/29/12 2114  Weight: 86.6 kg (190 lb 14.7 oz) 86.6 kg (190 lb 14.7 oz) 75.4 kg (166 lb 3.6 oz)    Exam:   General:  Pt in NAD, Alert and awake  Cardiovascular: RRR, no MRG  Respiratory: CTA BL, no wheezes  Abdomen: soft, NT, ND  Musculoskeletal: Left and right BKA.   Data Reviewed: Basic Metabolic Panel:  Recent Labs Lab 09/26/12 1421 09/27/12 0500 09/27/12 1635 09/28/12 0525 09/29/12 0600 09/30/12 0456  NA 138 140  --  138 138 133*  K 3.7 3.8  --  3.7 3.6 3.9  CL 100 101  --  102 101  97  CO2 25 23  --  25 23 22   GLUCOSE 129* 173*  --  117* 40* 129*  BUN 20 24*  --  33* 39* 43*  CREATININE 1.68* 1.98* 2.17* 2.58* 3.70* 4.07*  CALCIUM 9.1 8.6  --  8.1* 8.3* 8.0*  MG  --  2.0  --   --   --   --   PHOS  --   --   --   --   --  5.0*   Liver Function Tests:  Recent Labs Lab 09/26/12 1421 09/30/12 0456  AST 23  --   ALT 20  --   ALKPHOS 203*  --   BILITOT 0.7  --   PROT 8.3  --   ALBUMIN 1.8* 1.3*    Recent Labs Lab 09/26/12 1421  LIPASE 27   No results found for this basename: AMMONIA,  in the last 168 hours CBC:  Recent Labs Lab 09/26/12 1421 09/27/12 0500 09/27/12 1615 09/29/12 0600  09/30/12 0456  WBC 16.5* 18.5* 19.4* 17.0* 14.7*  NEUTROABS 12.4*  --   --   --   --   HGB 7.5* 6.5* 8.8* 8.4* 8.0*  HCT 23.6* 20.5* 26.7* 23.9* 23.2*  MCV 82.2 82.0 82.9 79.7 78.6  PLT 374 319 300 320 319   Cardiac Enzymes: No results found for this basename: CKTOTAL, CKMB, CKMBINDEX, TROPONINI,  in the last 168 hours BNP (last 3 results) No results found for this basename: PROBNP,  in the last 8760 hours CBG:  Recent Labs Lab 09/29/12 1635 09/29/12 2120 09/29/12 2355 09/30/12 0748 09/30/12 1112  GLUCAP 124* 117* 115* 124* 102*    Recent Results (from the past 240 hour(s))  CULTURE, BLOOD (ROUTINE X 2)     Status: None   Collection Time    09/26/12  7:14 PM      Result Value Range Status   Specimen Description BLOOD ARM LEFT   Final   Special Requests BOTTLES DRAWN AEROBIC ONLY 10CC   Final   Culture  Setup Time 09/27/2012 04:13   Final   Culture     Final   Value:        BLOOD CULTURE RECEIVED NO GROWTH TO DATE CULTURE WILL BE HELD FOR 5 DAYS BEFORE ISSUING A FINAL NEGATIVE REPORT   Report Status PENDING   Incomplete  CULTURE, BLOOD (ROUTINE X 2)     Status: None   Collection Time    09/26/12  7:34 PM      Result Value Range Status   Specimen Description BLOOD FOREARM LEFT   Final   Special Requests BOTTLES DRAWN AEROBIC ONLY 10CC   Final   Culture  Setup Time 09/27/2012 04:13   Final   Culture     Final   Value:        BLOOD CULTURE RECEIVED NO GROWTH TO DATE CULTURE WILL BE HELD FOR 5 DAYS BEFORE ISSUING A FINAL NEGATIVE REPORT   Report Status PENDING   Incomplete  MRSA PCR SCREENING     Status: None   Collection Time    09/26/12  8:49 PM      Result Value Range Status   MRSA by PCR NEGATIVE  NEGATIVE Final   Comment:            The GeneXpert MRSA Assay (FDA     approved for NASAL specimens     only), is one component of a     comprehensive MRSA colonization  surveillance program. It is not     intended to diagnose MRSA     infection nor to guide or      monitor treatment for     MRSA infections.  SURGICAL PCR SCREEN     Status: None   Collection Time    09/27/12 11:50 AM      Result Value Range Status   MRSA, PCR NEGATIVE  NEGATIVE Final   Staphylococcus aureus NEGATIVE  NEGATIVE Final   Comment:            The Xpert SA Assay (FDA     approved for NASAL specimens     in patients over 51 years of age),     is one component of     a comprehensive surveillance     program.  Test performance has     been validated by [employer] for patients greater     than or equal to 30 year old.     It is not intended     to diagnose infection nor to     guide or monitor treatment.  URINE CULTURE     Status: None   Collection Time    09/28/12  6:41 AM      Result Value Range Status   Specimen Description URINE, RANDOM   Final   Special Requests NONE   Final   Culture  Setup Time 09/28/2012 10:37   Final   Colony Count >=100,000 COLONIES/ML   Final   Culture     Final   Value: Multiple bacterial morphotypes present, none predominant. Suggest appropriate recollection if clinically indicated.   Report Status 09/29/2012 FINAL   Final     Studies: No results found.  Scheduled Meds: . antiseptic oral rinse  15 mL Mouth Rinse BID  . aspirin  325 mg Oral BID  . docusate sodium  100 mg Oral BID  . enoxaparin (LOVENOX) injection  30 mg Subcutaneous Q24H  . feeding supplement  237 mL Oral TID BM  . feeding supplement (NEPRO CARB STEADY)  237 mL Oral BID BM  . ferumoxytol  1,020 mg Intravenous Once  . hydrALAZINE  50 mg Oral Q8H  . insulin aspart  0-9 Units Subcutaneous TID WC  . metoCLOPramide  5 mg Oral TID AC  . metoprolol tartrate  25 mg Oral BID  . pantoprazole  40 mg Oral q morning - 10a  . polyethylene glycol  17 g Oral Daily   Continuous Infusions: . sodium chloride 20 mL/hr at 09/30/12 0234    Principal Problem:   Gangrene of foot Left Active Problems:   H/O: CVA (cerebrovascular accident)   Hypertension   Anemia   DM  (diabetes mellitus) type 2, uncontrolled, with ketoacidosis   Nausea with vomiting   Fever   UTI (lower urinary tract infection)   Wound eschar of foot    Time spent: > 35 minutes    Velvet Bathe  [facility] Pager [phone] If 7PM-7AM, please contact night-coverage at [email or web address], password [facility] 09/30/2012, 3:59 PM  LOS: 4 days

## 2012-10-01 ENCOUNTER — Inpatient Hospital Stay (HOSPITAL_COMMUNITY): Payer: Medicare Other

## 2012-10-01 LAB — TYPE AND SCREEN
ABO/RH(D): B POS
Unit division: 0
Unit division: 0

## 2012-10-01 LAB — CBC WITH DIFFERENTIAL/PLATELET
Basophils Absolute: 0 10*3/uL (ref 0.0–0.1)
Basophils Relative: 0 % (ref 0–1)
HCT: 23 % — ABNORMAL LOW (ref 39.0–52.0)
Lymphocytes Relative: 11 % — ABNORMAL LOW (ref 12–46)
MCHC: 33.5 g/dL (ref 30.0–36.0)
Monocytes Absolute: 1.1 10*3/uL — ABNORMAL HIGH (ref 0.1–1.0)
Neutro Abs: 8.9 10*3/uL — ABNORMAL HIGH (ref 1.7–7.7)
Neutrophils Relative %: 78 % — ABNORMAL HIGH (ref 43–77)
Platelets: 331 10*3/uL (ref 150–400)
RDW: 16.5 % — ABNORMAL HIGH (ref 11.5–15.5)
WBC: 11.3 10*3/uL — ABNORMAL HIGH (ref 4.0–10.5)

## 2012-10-01 LAB — RENAL FUNCTION PANEL
Albumin: 1.3 g/dL — ABNORMAL LOW (ref 3.5–5.2)
Calcium: 8.2 mg/dL — ABNORMAL LOW (ref 8.4–10.5)
GFR calc Af Amer: 15 mL/min — ABNORMAL LOW (ref >90)
Phosphorus: 5.8 mg/dL — ABNORMAL HIGH (ref 2.3–4.6)
Potassium: 3.7 mEq/L (ref 3.5–5.1)
Sodium: 133 mEq/L — ABNORMAL LOW (ref 135–145)

## 2012-10-01 LAB — CBC
HCT: 22.4 % — ABNORMAL LOW (ref 39.0–52.0)
MCHC: 34.8 g/dL (ref 30.0–36.0)
MCV: 78.6 fL (ref 78.0–100.0)
Platelets: 355 10*3/uL (ref 150–400)
RDW: 16.2 % — ABNORMAL HIGH (ref 11.5–15.5)
WBC: 11.3 10*3/uL — ABNORMAL HIGH (ref 4.0–10.5)

## 2012-10-01 LAB — GLUCOSE, CAPILLARY
Glucose-Capillary: 169 mg/dL — ABNORMAL HIGH (ref 70–99)
Glucose-Capillary: 203 mg/dL — ABNORMAL HIGH (ref 70–99)

## 2012-10-01 MED ORDER — FLUCONAZOLE 200 MG PO TABS
200.0000 mg | ORAL_TABLET | Freq: Once | ORAL | Status: AC
  Administered 2012-10-01: 200 mg via ORAL
  Filled 2012-10-01: qty 1

## 2012-10-01 MED ORDER — FLUCONAZOLE 100 MG PO TABS
100.0000 mg | ORAL_TABLET | Freq: Every day | ORAL | Status: DC
  Administered 2012-10-02 – 2012-10-03 (×2): 100 mg via ORAL
  Filled 2012-10-01 (×3): qty 1

## 2012-10-01 NOTE — Progress Notes (Signed)
PATIENT ID: [name]  MRN: [id number]  DOB/AGE:  1950-10-16 / 62 y.o.  4 Days Post-Op Procedure(s) (LRB): AMPUTATION BELOW KNEE (Left)    PROGRESS NOTE Subjective:   Patient is alert, oriented, no Nausea, no Vomiting, yes passing gas, yes Bowel Movement. Taking PO well . Denies SOB, Chest or Calf Pain. Using [employer], Patient reports pain as mild, patient sitting up in bed side chair.  Patient's wound on left lower leg BKA has no noticeable drainage.     Objective: Vital signs in last 24 hours: Temp:  [98.6 F (37 C)-98.9 F (37.2 C)] 98.6 F (37 C) (07/27 0510) Pulse Rate:  [72-81] 72 (07/27 0510) Resp:  [16-18] 16 (07/27 0510) BP: (113-121)/(51-91) 113/51 mmHg (07/27 0510) SpO2:  [97 %-100 %] 98 % (07/27 0510) Weight:  [76.7 kg (169 lb 1.5 oz)] 76.7 kg (169 lb 1.5 oz) (07/26 2133)    Intake/Output from previous day: I/O last 3 completed shifts: In: 882.7 [P.O.:200; I.V.:548.7; IV Piggyback:134] Out: 1125 [Urine:1125]   Intake/Output this shift:     LABORATORY DATA:  Recent Labs  09/30/12 0456  09/30/12 1625 09/30/12 2133 10/01/12 0505 10/01/12 0738  WBC 14.7*  --   --   --  11.3*  --   HGB 8.0*  --   --   --  7.8*  --   HCT 23.2*  --   --   --  22.4*  --   PLT 319  --   --   --  355  --   NA 133*  --   --   --  133*  --   K 3.9  --   --   --  3.7  --   CL 97  --   --   --  98  --   CO2 22  --   --   --  21  --   BUN 43*  --   --   --  49*  --   CREATININE 4.07*  --   --   --  4.34*  --   GLUCOSE 129*  --   --   --  172*  --   GLUCAP  --   < > 128* 195*  --  169*  CALCIUM 8.0*  --   --   --  8.2*  --   < > = values in this interval not displayed.  Examination: Neurologically intact ABD soft Incision: dressing C/D/I No cellulitis present} Again patient's left extremity has no signs of cellulitis and no erythema or warmth.  Patient has no discharge from his left BKA.  Assessment:   4 Days Post-Op Procedure(s) (LRB): AMPUTATION BELOW KNEE  (Left) ADDITIONAL DIAGNOSIS:  Diabetes, Hypertension, Renal Insufficiency Chronic, Renal Insufficiency Acute and UTI during Hospitalization  Plan:patient will have to use a wheelchair for ambulation.  Patient followed By nephrology and internal medicine for his acute and chronic kidney disease. Patient's staple removed from the right BKA Continue to monitor for fever and check urine cultures. Plan discharge to skilled nursing facility once medically stable.     [name] R 10/01/2012, 9:34 AM

## 2012-10-01 NOTE — Progress Notes (Signed)
S: No new CO  Drinking nepro O:BP 113/54  Pulse 55  Temp(Src) 99.6 F (37.6 C) (Oral)  Resp 18  Ht 5' 10.87" (1.8 m)  Wt 76.7 kg (169 lb 1.5 oz)  BMI 23.67 kg/m2  SpO2 98%  Intake/Output Summary (Last 24 hours) at 10/01/12 1058 Last data filed at 10/01/12 1025  Gross per 24 hour  Intake    934 ml  Output    825 ml  Net    109 ml   Weight change: 1.3 kg (2 lb 13.9 oz) IN:[id number] and alert [facility]S:RRR Resp:clear Abd:+ BS NTND Ext:bil BKA NEURO:CNI OX3 no asterixis   . antiseptic oral rinse  15 mL Mouth Rinse BID  . aspirin  325 mg Oral BID  . docusate sodium  100 mg Oral BID  . enoxaparin (LOVENOX) injection  30 mg Subcutaneous Q24H  . feeding supplement  237 mL Oral TID BM  . feeding supplement (NEPRO CARB STEADY)  237 mL Oral BID BM  . hydrALAZINE  50 mg Oral Q8H  . insulin aspart  0-9 Units Subcutaneous TID WC  . metoCLOPramide  5 mg Oral TID AC  . metoprolol tartrate  25 mg Oral BID  . pantoprazole  40 mg Oral q morning - 10a  . polyethylene glycol  17 g Oral Daily   No results found. BMET    Component Value Date/Time   NA 133* 10/01/2012 0505   K 3.7 10/01/2012 0505   CL 98 10/01/2012 0505   CO2 21 10/01/2012 0505   GLUCOSE 172* 10/01/2012 0505   BUN 49* 10/01/2012 0505   CREATININE 4.34* 10/01/2012 0505   CALCIUM 8.2* 10/01/2012 0505   GFRNONAA 13* 10/01/2012 0505   GFRAA 15* 10/01/2012 0505   CBC    Component Value Date/Time   WBC 11.3* 10/01/2012 0505   RBC 2.85* 10/01/2012 0505   HGB 7.8* 10/01/2012 0505   HCT 22.4* 10/01/2012 0505   PLT 355 10/01/2012 0505   MCV 78.6 10/01/2012 0505   MCH 27.4 10/01/2012 0505   MCHC 34.8 10/01/2012 0505   RDW 16.2* 10/01/2012 0505   LYMPHSABS 2.3 09/26/2012 1421   MONOABS 1.8* 09/26/2012 1421   EOSABS 0.0 09/26/2012 1421   BASOSABS 0.0 09/26/2012 1421     Assessment: 1. Acute on CKD, Scr still rising.   2. SP Lt BKA 3. DM 4. HTN 5. Anemia 6. Yeast UTI  Plan: 1. Consider po fluconazole for yeast UTI 2. Daily Scr 3.   Hopefully Scr will level off in next few days   [name] T

## 2012-10-01 NOTE — Progress Notes (Signed)
[facility] PROGRESS NOTE  Kiros Bischoff [id number] DOB: 01-30-1951 DOA: 09/26/2012 PCP: No PCP Per Patient  Assessment/Plan: #1 fever  -Resolved - currently will be started on fluconazole for yeast in urine culture.  #2 left lower extremity malodorous wounds/? Gangrenous lower extremity  - Ortho on board and we will await for further evaluation and recommendations. - Pt is s/p L BKA  #3 hypertension  - Stable. Continue home regimen of hydralazine, metoprolol.   #4 UTI  - Growing yeast. Will place on fluconazole.  #5 well controlled type 2 diabetes  - Hemoglobin A1c was 5.9 on 09/06/2012. - Continue SSI - advance diet to diabetic diet and place orders for NPO this midnight.  #6 anemia  Likely anemia of chronic disease.  - transfused 2 units of PRBC since hgb 6.5 initially.  - hgb trending down.  Will reassess next am.  #7 leukocytosis  Resolving currently. Will start treatment for yeast in urine culture.  #8 history of CVA  On aspirin 325mg  daily.   #9 nausea and vomiting  - Resolved patient on reglan. - tolerating diabetic diet.   #10 Acute on chronic kidney disease  Baseline creatinine 1.2-1.4.  - Renal ultrasound reportedly showing no hydronephrosis, BL echogenic kidneys consistent with medical renal disease.  - Nephrology on board and making further evaluation and recommendations.  #11 prophylaxis  PPI for GI prophylaxis. Lovenox for DVT prophylaxis.   Code Status: Full  Family Communication: patient and family member at bedside. Disposition Plan: With improvement in WBC levels and S creatinine   Consultants:  Ortho: Dr. Mayer Camel  Nephrology Dr. Mercy Moore  Procedures:  None  Antibiotics:  Vancomycin and Zosyn d/c 7/25  Antifungal Fluconazole 10/01/12  HPI/Subjective: No new complaints.  No acute issues overnight.  Objective: Filed Vitals:   09/30/12 2133 10/01/12 0510 10/01/12 1024 10/01/12 1255  BP: 121/55 113/51 113/54 110/59   Pulse: 81 72 55 97  Temp: 98.9 F (37.2 C) 98.6 F (37 C) 99.6 F (37.6 C) 98.9 F (37.2 C)  TempSrc: Oral Oral    Resp: 18 16 18 18   Height:      Weight: 76.7 kg (169 lb 1.5 oz)     SpO2: 97% 98% 98% 98%    Intake/Output Summary (Last 24 hours) at 10/01/12 1619 Last data filed at 10/01/12 1356  Gross per 24 hour  Intake   1074 ml  Output   1100 ml  Net    -26 ml   Filed Weights   09/28/12 2057 09/29/12 2114 09/30/12 2133  Weight: 86.6 kg (190 lb 14.7 oz) 75.4 kg (166 lb 3.6 oz) 76.7 kg (169 lb 1.5 oz)    Exam:   General:  Pt in NAD, Alert and awake  Cardiovascular: RRR, no MRG  Respiratory: CTA BL, no wheezes  Abdomen: soft, NT, ND  Musculoskeletal: Left and right BKA.   Data Reviewed: Basic Metabolic Panel:  Recent Labs Lab 09/27/12 0500 09/27/12 1635 09/28/12 0525 09/29/12 0600 09/30/12 0456 10/01/12 0505  NA 140  --  138 138 133* 133*  K 3.8  --  3.7 3.6 3.9 3.7  CL 101  --  102 101 97 98  CO2 23  --  25 23 22 21   GLUCOSE 173*  --  117* 40* 129* 172*  BUN 24*  --  33* 39* 43* 49*  CREATININE 1.98* 2.17* 2.58* 3.70* 4.07* 4.34*  CALCIUM 8.6  --  8.1* 8.3* 8.0* 8.2*  MG 2.0  --   --   --   --   --  PHOS  --   --   --   --  5.0* 5.8*   Liver Function Tests:  Recent Labs Lab 09/26/12 1421 09/30/12 0456 10/01/12 0505  AST 23  --   --   ALT 20  --   --   ALKPHOS 203*  --   --   BILITOT 0.7  --   --   PROT 8.3  --   --   ALBUMIN 1.8* 1.3* 1.3*    Recent Labs Lab 09/26/12 1421  LIPASE 27   No results found for this basename: AMMONIA,  in the last 168 hours CBC:  Recent Labs Lab 09/26/12 1421 09/27/12 0500 09/27/12 1615 09/29/12 0600 09/30/12 0456 10/01/12 0505  WBC 16.5* 18.5* 19.4* 17.0* 14.7* 11.3*  NEUTROABS 12.4*  --   --   --   --   --   HGB 7.5* 6.5* 8.8* 8.4* 8.0* 7.8*  HCT 23.6* 20.5* 26.7* 23.9* 23.2* 22.4*  MCV 82.2 82.0 82.9 79.7 78.6 78.6  PLT 374 319 300 320 319 355   Cardiac Enzymes: No results found for  this basename: CKTOTAL, CKMB, CKMBINDEX, TROPONINI,  in the last 168 hours BNP (last 3 results) No results found for this basename: PROBNP,  in the last 8760 hours CBG:  Recent Labs Lab 09/30/12 1112 09/30/12 1625 09/30/12 2133 10/01/12 0738 10/01/12 1118  GLUCAP 102* 128* 195* 169* 188*    Recent Results (from the past 240 hour(s))  CULTURE, BLOOD (ROUTINE X 2)     Status: None   Collection Time    09/26/12  7:14 PM      Result Value Range Status   Specimen Description BLOOD ARM LEFT   Final   Special Requests BOTTLES DRAWN AEROBIC ONLY 10CC   Final   Culture  Setup Time 09/27/2012 04:13   Final   Culture     Final   Value:        BLOOD CULTURE RECEIVED NO GROWTH TO DATE CULTURE WILL BE HELD FOR 5 DAYS BEFORE ISSUING A FINAL NEGATIVE REPORT   Report Status PENDING   Incomplete  CULTURE, BLOOD (ROUTINE X 2)     Status: None   Collection Time    09/26/12  7:34 PM      Result Value Range Status   Specimen Description BLOOD FOREARM LEFT   Final   Special Requests BOTTLES DRAWN AEROBIC ONLY 10CC   Final   Culture  Setup Time 09/27/2012 04:13   Final   Culture     Final   Value:        BLOOD CULTURE RECEIVED NO GROWTH TO DATE CULTURE WILL BE HELD FOR 5 DAYS BEFORE ISSUING A FINAL NEGATIVE REPORT   Report Status PENDING   Incomplete  MRSA PCR SCREENING     Status: None   Collection Time    09/26/12  8:49 PM      Result Value Range Status   MRSA by PCR NEGATIVE  NEGATIVE Final   Comment:            The GeneXpert MRSA Assay (FDA     approved for NASAL specimens     only), is one component of a     comprehensive MRSA colonization     surveillance program. It is not     intended to diagnose MRSA     infection nor to guide or     monitor treatment for     MRSA infections.  SURGICAL PCR SCREEN  Status: None   Collection Time    09/27/12 11:50 AM      Result Value Range Status   MRSA, PCR NEGATIVE  NEGATIVE Final   Staphylococcus aureus NEGATIVE  NEGATIVE Final    Comment:            The Xpert SA Assay (FDA     approved for NASAL specimens     in patients over 68 years of age),     is one component of     a comprehensive surveillance     program.  Test performance has     been validated by [employer] for patients greater     than or equal to 8 year old.     It is not intended     to diagnose infection nor to     guide or monitor treatment.  URINE CULTURE     Status: None   Collection Time    09/28/12  6:41 AM      Result Value Range Status   Specimen Description URINE, RANDOM   Final   Special Requests NONE   Final   Culture  Setup Time 09/28/2012 10:37   Final   Colony Count >=100,000 COLONIES/ML   Final   Culture     Final   Value: Multiple bacterial morphotypes present, none predominant. Suggest appropriate recollection if clinically indicated.   Report Status 09/29/2012 FINAL   Final  URINE CULTURE     Status: None   Collection Time    09/29/12  2:17 PM      Result Value Range Status   Specimen Description URINE, CLEAN CATCH   Final   Special Requests NONE   Final   Culture  Setup Time 09/29/2012 16:16   Final   Colony Count >=100,000 COLONIES/ML   Final   Culture YEAST   Final   Report Status 09/30/2012 FINAL   Final     Studies: No results found.  Scheduled Meds: . antiseptic oral rinse  15 mL Mouth Rinse BID  . aspirin  325 mg Oral BID  . docusate sodium  100 mg Oral BID  . enoxaparin (LOVENOX) injection  30 mg Subcutaneous Q24H  . feeding supplement  237 mL Oral TID BM  . feeding supplement (NEPRO CARB STEADY)  237 mL Oral BID BM  . hydrALAZINE  50 mg Oral Q8H  . insulin aspart  0-9 Units Subcutaneous TID WC  . metoCLOPramide  5 mg Oral TID AC  . metoprolol tartrate  25 mg Oral BID  . pantoprazole  40 mg Oral q morning - 10a  . polyethylene glycol  17 g Oral Daily   Continuous Infusions: . sodium chloride 20 mL/hr at 10/01/12 0017    Principal Problem:   Gangrene of foot Left Active Problems:   H/O: CVA  (cerebrovascular accident)   Hypertension   Anemia   DM (diabetes mellitus) type 2, uncontrolled, with ketoacidosis   Nausea with vomiting   Fever   UTI (lower urinary tract infection)   Wound eschar of foot    Time spent: > 35 minutes    Velvet Bathe  [facility] Pager [phone] If 7PM-7AM, please contact night-coverage at [email or web address], password [facility] 10/01/2012, 4:19 PM  LOS: 5 days

## 2012-10-01 NOTE — Progress Notes (Signed)
ANTIBIOTIC CONSULT NOTE - INITIAL  Pharmacy Consult for fluconazole Indication: UTI  No Known Allergies  Patient Measurements: Height: 5' 10.87" (180 cm) Weight: 169 lb 1.5 oz (76.7 kg) IBW/kg (Calculated) : 74.99   Vital Signs: Temp: 98.9 F (37.2 C) (07/27 1255) Temp src: Oral (07/27 0510) BP: 110/59 mmHg (07/27 1255) Pulse Rate: 97 (07/27 1255) Intake/Output from previous day: 07/26 0701 - 07/27 0700 In: 814 [P.O.:200; I.V.:480; IV Piggyback:134] Out: 625 [Urine:625] Intake/Output from this shift: Total I/O In: 360 [P.O.:360] Out: 475 [Urine:475]  Labs:  Recent Labs  09/29/12 0600 09/29/12 0631 09/29/12 1417 09/30/12 0456 10/01/12 0505  WBC 17.0*  --   --  14.7* 11.3*  HGB 8.4*  --   --  8.0* 7.8*  PLT 320  --   --  319 355  LABCREA  --  56.84 43.99  --   --   CREATININE 3.70*  --   --  4.07* 4.34*   Estimated Creatinine Clearance: 18.7 ml/min (by C-G formula based on Cr of 4.34). No results found for this basename: VANCOTROUGH, Corlis Leak, VANCORANDOM, GENTTROUGH, GENTPEAK, GENTRANDOM, TOBRATROUGH, TOBRAPEAK, TOBRARND, AMIKACINPEAK, AMIKACINTROU, AMIKACIN,  in the last 72 hours   Microbiology: Recent Results (from the past 720 hour(s))  URINE CULTURE     Status: None   Collection Time    09/07/12  1:22 PM      Result Value Range Status   Specimen Description URINE, RANDOM   Final   Special Requests NONE   Final   Culture  Setup Time 09/07/2012 16:58   Final   Colony Count 9,000 COLONIES/ML   Final   Culture INSIGNIFICANT GROWTH   Final   Report Status 09/08/2012 FINAL   Final  CULTURE, BLOOD (ROUTINE X 2)     Status: None   Collection Time    09/26/12  7:14 PM      Result Value Range Status   Specimen Description BLOOD ARM LEFT   Final   Special Requests BOTTLES DRAWN AEROBIC ONLY 10CC   Final   Culture  Setup Time 09/27/2012 04:13   Final   Culture     Final   Value:        BLOOD CULTURE RECEIVED NO GROWTH TO DATE CULTURE WILL BE HELD FOR 5 DAYS  BEFORE ISSUING A FINAL NEGATIVE REPORT   Report Status PENDING   Incomplete  CULTURE, BLOOD (ROUTINE X 2)     Status: None   Collection Time    09/26/12  7:34 PM      Result Value Range Status   Specimen Description BLOOD FOREARM LEFT   Final   Special Requests BOTTLES DRAWN AEROBIC ONLY 10CC   Final   Culture  Setup Time 09/27/2012 04:13   Final   Culture     Final   Value:        BLOOD CULTURE RECEIVED NO GROWTH TO DATE CULTURE WILL BE HELD FOR 5 DAYS BEFORE ISSUING A FINAL NEGATIVE REPORT   Report Status PENDING   Incomplete  MRSA PCR SCREENING     Status: None   Collection Time    09/26/12  8:49 PM      Result Value Range Status   MRSA by PCR NEGATIVE  NEGATIVE Final   Comment:            The GeneXpert MRSA Assay (FDA     approved for NASAL specimens     only), is one component of a     comprehensive  MRSA colonization     surveillance program. It is not     intended to diagnose MRSA     infection nor to guide or     monitor treatment for     MRSA infections.  SURGICAL PCR SCREEN     Status: None   Collection Time    09/27/12 11:50 AM      Result Value Range Status   MRSA, PCR NEGATIVE  NEGATIVE Final   Staphylococcus aureus NEGATIVE  NEGATIVE Final   Comment:            The Xpert SA Assay (FDA     approved for NASAL specimens     in patients over 37 years of age),     is one component of     a comprehensive surveillance     program.  Test performance has     been validated by [employer] for patients greater     than or equal to 81 year old.     It is not intended     to diagnose infection nor to     guide or monitor treatment.  URINE CULTURE     Status: None   Collection Time    09/28/12  6:41 AM      Result Value Range Status   Specimen Description URINE, RANDOM   Final   Special Requests NONE   Final   Culture  Setup Time 09/28/2012 10:37   Final   Colony Count >=100,000 COLONIES/ML   Final   Culture     Final   Value: Multiple bacterial morphotypes  present, none predominant. Suggest appropriate recollection if clinically indicated.   Report Status 09/29/2012 FINAL   Final  URINE CULTURE     Status: None   Collection Time    09/29/12  2:17 PM      Result Value Range Status   Specimen Description URINE, CLEAN CATCH   Final   Special Requests NONE   Final   Culture  Setup Time 09/29/2012 16:16   Final   Colony Count >=100,000 COLONIES/ML   Final   Culture YEAST   Final   Report Status 09/30/2012 FINAL   Final    Medical History: Past Medical History  Diagnosis Date  . Hypertension   . 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 Myocardial infarction   . Hx of seasonal allergies   . GERD (gastroesophageal reflux disease)   . High cholesterol   . Exertional shortness of breath     "before my last OR; I'm fine now" (09/26/2012)  . Type II diabetes mellitus     Medications:  Prescriptions prior to admission  Medication Sig Dispense Refill  . acetaminophen (TYLENOL) 325 MG tablet Take 2 tablets (650 mg total) by mouth every 6 (six) hours as needed.      Marland Kitchen aspirin 325 MG tablet Take 1 tablet (325 mg total) by mouth 2 (two) times daily.  30 tablet  0  . hydrALAZINE (APRESOLINE) 50 MG tablet Take 1 tablet (50 mg total) by mouth every 8 (eight) hours.      . insulin aspart (NOVOLOG) 100 UNIT/ML injection Inject 0-9 Units into the skin 3 (three) times daily with meals. CBG 70 - 120: 0 units CBG 121 - 150: 1 unit,  CBG 151 - 200: 2 units,  CBG 201 - 250: 3 units,  CBG 251 - 300: 5 units,  CBG 301 - 350: 7 units,  CBG 351 - 400: 9 units   CBG > 400: 9 units and notify your MD  1 vial  12  . insulin aspart (NOVOLOG) 100 UNIT/ML injection Inject 4 Units into the skin 3 (three) times daily with meals.  1 vial  12  . insulin detemir (LEVEMIR) 100 UNIT/ML injection Inject 0.15 mLs (15 Units total) into the skin at bedtime.  10 mL  12  . metoCLOPramide (REGLAN) 5 MG tablet Take 1 tablet (5 mg total) by mouth 3 (three) times daily before meals.      .  metoprolol tartrate (LOPRESSOR) 25 MG tablet Take 1 tablet (25 mg total) by mouth 2 (two) times daily.      . ondansetron (ZOFRAN) 4 MG tablet Take 1 tablet (4 mg total) by mouth every 6 (six) hours as needed for nausea.  20 tablet  0  . oxyCODONE (OXY IR/ROXICODONE) 5 MG immediate release tablet Take 1-2 tablets (5-10 mg total) by mouth every 4 (four) hours as needed.  60 tablet  0  . pantoprazole (PROTONIX) 40 MG tablet Take 1 tablet (40 mg total) by mouth daily at 6 (six) AM.      . polyethylene glycol (MIRALAX / GLYCOLAX) packet Take 17 g by mouth daily.  14 each  0  . sodium chloride 0.9 % injection 10-40 mLs by Intracatheter route as needed (flush).  5 mL    . sodium chloride 0.9 % SOLN 500 mL with vancomycin 10 G SOLR 1,500 mg Inject 1,500 mg into the vein daily. Stop date on July 10th      . piperacillin-tazobactam (ZOSYN) 3.375 GM/50ML IVPB Inject 50 mLs (3.375 g total) into the vein every 8 (eight) hours. Stop on July10th  50 mL     Assessment: 62 yo man to start fluconazole for yeast in urine.  He has CKD with SrCr 4.34.  WBC 11.3, T max 99.6  Goal of Therapy:  Eradication of infection  Plan:  Diflucan 200 mg po X 1 then 100 mg daily. F/u clinical course, cultures and renal function. F/u length of therapy.  Excell Seltzer Poteet 10/01/2012,4:46 PM

## 2012-10-02 LAB — RENAL FUNCTION PANEL
Albumin: 1.3 g/dL — ABNORMAL LOW (ref 3.5–5.2)
BUN: 51 mg/dL — ABNORMAL HIGH (ref 6–23)
CO2: 21 meq/L (ref 19–32)
Calcium: 8.3 mg/dL — ABNORMAL LOW (ref 8.4–10.5)
Chloride: 100 meq/L (ref 96–112)
Creatinine, Ser: 4.3 mg/dL — ABNORMAL HIGH (ref 0.50–1.35)
GFR calc Af Amer: 16 mL/min — ABNORMAL LOW
GFR calc non Af Amer: 13 mL/min — ABNORMAL LOW
Glucose, Bld: 145 mg/dL — ABNORMAL HIGH (ref 70–99)
Phosphorus: 6.1 mg/dL — ABNORMAL HIGH (ref 2.3–4.6)
Potassium: 3.9 meq/L (ref 3.5–5.1)
Sodium: 134 meq/L — ABNORMAL LOW (ref 135–145)

## 2012-10-02 LAB — GLUCOSE, CAPILLARY
Glucose-Capillary: 122 mg/dL — ABNORMAL HIGH (ref 70–99)
Glucose-Capillary: 174 mg/dL — ABNORMAL HIGH (ref 70–99)

## 2012-10-02 LAB — PARATHYROID HORMONE, INTACT (NO CA): PTH: 298 pg/mL — ABNORMAL HIGH (ref 14.0–72.0)

## 2012-10-02 MED ORDER — DARBEPOETIN ALFA-POLYSORBATE 150 MCG/0.3ML IJ SOLN
150.0000 ug | INTRAMUSCULAR | Status: DC
  Administered 2012-10-02 – 2012-10-09 (×2): 150 ug via SUBCUTANEOUS
  Filled 2012-10-02 (×4): qty 0.3

## 2012-10-02 NOTE — Progress Notes (Signed)
[facility] PROGRESS NOTE  [name] [id number] DOB: 04-Aug-1950 DOA: 09/26/2012 PCP: No PCP Per Patient  Assessment/Plan:  #1 fever  - On fluconazole for yeast in urine culture.  #2 left lower extremity malodorous wounds/? Gangrenous lower extremity  - Ortho on board and we will await for further evaluation and recommendations. - Pt is s/p L BKA  #3 hypertension  - Stable. Continue home regimen of hydralazine, metoprolol.   #4 UTI  - Growing yeast. Will place on fluconazole.  #5 well controlled type 2 diabetes  - Hemoglobin A1c was 5.9 on 09/06/2012. - Continue SSI  #6 anemia  Likely anemia of chronic disease.  - transfused 2 units of PRBC since hgb 6.5 initially.  - hgb trending down.  Will reassess next am.  #7 leukocytosis  Resolving currently. Will start treatment for yeast in urine culture.  #8 history of CVA  On aspirin 325 mg daily.   #9 nausea and vomiting  - Resolved patient on reglan. - tolerating renal diet  #10 Acute on chronic kidney disease  Baseline creatinine 1.2-1.4.  - Renal ultrasound reportedly showing no hydronephrosis, BL echogenic kidneys consistent with medical renal disease.  - Nephrology on board and making further evaluation and recommendations. - Monitoring S creatinine daily  #11 prophylaxis  PPI for GI prophylaxis. Lovenox for DVT prophylaxis.   Code Status: Full  Family Communication: patient and family member at bedside. Disposition Plan: With improvement in WBC levels and S creatinine. Will transition to SNF once cleared medically.  Consultants:  Ortho: Dr. Mayer Camel  Nephrology Dr. Mercy Moore  Procedures:  None  Antibiotics:  Vancomycin and Zosyn d/c 7/25  Antifungal Fluconazole 10/01/12  HPI/Subjective: No new complaints.  No acute issues overnight.  Objective: Filed Vitals:   10/01/12 2056 10/02/12 0521 10/02/12 0945 10/02/12 1335  BP: 142/62 114/52 129/55 118/53  Pulse: 93 63 67 74  Temp: 101.9 F  (38.8 C) 97.9 F (36.6 C) 97.7 F (36.5 C) 98.7 F (37.1 C)  TempSrc: Oral Oral Oral Oral  Resp: 18 18 20 20   Height:      Weight: 74.2 kg (163 lb 9.3 oz)     SpO2: 99% 100% 99% 98%    Intake/Output Summary (Last 24 hours) at 10/02/12 1831 Last data filed at 10/02/12 1300  Gross per 24 hour  Intake 735.67 ml  Output   1750 ml  Net -1014.33 ml   Filed Weights   09/29/12 2114 09/30/12 2133 10/01/12 2056  Weight: 75.4 kg (166 lb 3.6 oz) 76.7 kg (169 lb 1.5 oz) 74.2 kg (163 lb 9.3 oz)    Exam:   General:  Pt in NAD, Alert and awake  Cardiovascular: RRR, no MRG  Respiratory: CTA BL, no wheezes  Abdomen: soft, NT, ND  Musculoskeletal: Left and right BKA.   Data Reviewed: Basic Metabolic Panel:  Recent Labs Lab 09/27/12 0500  09/28/12 0525 09/29/12 0600 09/30/12 0456 10/01/12 0505 10/02/12 0455  NA 140  --  138 138 133* 133* 134*  K 3.8  --  3.7 3.6 3.9 3.7 3.9  CL 101  --  102 101 97 98 100  CO2 23  --  25 23 22 21 21   GLUCOSE 173*  --  117* 40* 129* 172* 145*  BUN 24*  --  33* 39* 43* 49* 51*  CREATININE 1.98*  < > 2.58* 3.70* 4.07* 4.34* 4.30*  CALCIUM 8.6  --  8.1* 8.3* 8.0* 8.2* 8.3*  MG 2.0  --   --   --   --   --   --  PHOS  --   --   --   --  5.0* 5.8* 6.1*  < > = values in this interval not displayed. Liver Function Tests:  Recent Labs Lab 09/26/12 1421 09/30/12 0456 10/01/12 0505 10/02/12 0455  AST 23  --   --   --   ALT 20  --   --   --   ALKPHOS 203*  --   --   --   BILITOT 0.7  --   --   --   PROT 8.3  --   --   --   ALBUMIN 1.8* 1.3* 1.3* 1.3*    Recent Labs Lab 09/26/12 1421  LIPASE 27   No results found for this basename: AMMONIA,  in the last 168 hours CBC:  Recent Labs Lab 09/26/12 1421  09/27/12 1615 09/29/12 0600 09/30/12 0456 10/01/12 0505 10/01/12 2238  WBC 16.5*  < > 19.4* 17.0* 14.7* 11.3* 11.3*  NEUTROABS 12.4*  --   --   --   --   --  8.9*  HGB 7.5*  < > 8.8* 8.4* 8.0* 7.8* 7.7*  HCT 23.6*  < > 26.7*  23.9* 23.2* 22.4* 23.0*  MCV 82.2  < > 82.9 79.7 78.6 78.6 79.6  PLT 374  < > 300 320 319 355 331  < > = values in this interval not displayed. Cardiac Enzymes: No results found for this basename: CKTOTAL, CKMB, CKMBINDEX, TROPONINI,  in the last 168 hours BNP (last 3 results) No results found for this basename: PROBNP,  in the last 8760 hours CBG:  Recent Labs Lab 10/01/12 1702 10/01/12 2100 10/02/12 0805 10/02/12 1217 10/02/12 1657  GLUCAP 203* 122* 130* 140* 174*    Recent Results (from the past 240 hour(s))  CULTURE, BLOOD (ROUTINE X 2)     Status: None   Collection Time    09/26/12  7:14 PM      Result Value Range Status   Specimen Description BLOOD ARM LEFT   Final   Special Requests BOTTLES DRAWN AEROBIC ONLY 10CC   Final   Culture  Setup Time 09/27/2012 04:13   Final   Culture     Final   Value:        BLOOD CULTURE RECEIVED NO GROWTH TO DATE CULTURE WILL BE HELD FOR 5 DAYS BEFORE ISSUING A FINAL NEGATIVE REPORT   Report Status PENDING   Incomplete  CULTURE, BLOOD (ROUTINE X 2)     Status: None   Collection Time    09/26/12  7:34 PM      Result Value Range Status   Specimen Description BLOOD FOREARM LEFT   Final   Special Requests BOTTLES DRAWN AEROBIC ONLY 10CC   Final   Culture  Setup Time 09/27/2012 04:13   Final   Culture     Final   Value:        BLOOD CULTURE RECEIVED NO GROWTH TO DATE CULTURE WILL BE HELD FOR 5 DAYS BEFORE ISSUING A FINAL NEGATIVE REPORT   Report Status PENDING   Incomplete  MRSA PCR SCREENING     Status: None   Collection Time    09/26/12  8:49 PM      Result Value Range Status   MRSA by PCR NEGATIVE  NEGATIVE Final   Comment:            The GeneXpert MRSA Assay (FDA     approved for NASAL specimens     only), is one component of  a     comprehensive MRSA colonization     surveillance program. It is not     intended to diagnose MRSA     infection nor to guide or     monitor treatment for     MRSA infections.  SURGICAL PCR SCREEN      Status: None   Collection Time    09/27/12 11:50 AM      Result Value Range Status   MRSA, PCR NEGATIVE  NEGATIVE Final   Staphylococcus aureus NEGATIVE  NEGATIVE Final   Comment:            The Xpert SA Assay (FDA     approved for NASAL specimens     in patients over 51 years of age),     is one component of     a comprehensive surveillance     program.  Test performance has     been validated by [employer] for patients greater     than or equal to 7 year old.     It is not intended     to diagnose infection nor to     guide or monitor treatment.  URINE CULTURE     Status: None   Collection Time    09/28/12  6:41 AM      Result Value Range Status   Specimen Description URINE, RANDOM   Final   Special Requests NONE   Final   Culture  Setup Time 09/28/2012 10:37   Final   Colony Count >=100,000 COLONIES/ML   Final   Culture     Final   Value: Multiple bacterial morphotypes present, none predominant. Suggest appropriate recollection if clinically indicated.   Report Status 09/29/2012 FINAL   Final  URINE CULTURE     Status: None   Collection Time    09/29/12  2:17 PM      Result Value Range Status   Specimen Description URINE, CLEAN CATCH   Final   Special Requests NONE   Final   Culture  Setup Time 09/29/2012 16:16   Final   Colony Count >=100,000 COLONIES/ML   Final   Culture YEAST   Final   Report Status 09/30/2012 FINAL   Final     Studies: Dg Chest Port 1 View  10/01/2012   *RADIOLOGY REPORT*  Clinical Data: Fever.  PORTABLE CHEST - 1 VIEW  Comparison: 09/26/2012  Findings: Stable positioning of PICC line in the SVC.  No edema, infiltrate or pleural fluid is seen.  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  IMPRESSION: No active disease.   Original Report Authenticated By: Aletta Edouard, M.D.    Scheduled Meds: . antiseptic oral rinse  15 mL Mouth Rinse BID  . aspirin  325 mg Oral BID  . darbepoetin (ARANESP) injection - NON-DIALYSIS  150 mcg  Subcutaneous Q Mon-1800  . docusate sodium  100 mg Oral BID  . enoxaparin (LOVENOX) injection  30 mg Subcutaneous Q24H  . feeding supplement  237 mL Oral TID BM  . feeding supplement (NEPRO CARB STEADY)  237 mL Oral BID BM  . fluconazole  100 mg Oral QHS  . hydrALAZINE  50 mg Oral Q8H  . insulin aspart  0-9 Units Subcutaneous TID WC  . metoCLOPramide  5 mg Oral TID AC  . metoprolol tartrate  25 mg Oral BID  . pantoprazole  40 mg Oral q morning - 10a  . polyethylene glycol  17 g  Oral Daily   Continuous Infusions: . sodium chloride 20 mL/hr at 10/01/12 0017    Principal Problem:   Gangrene of foot Left Active Problems:   H/O: CVA (cerebrovascular accident)   Hypertension   Anemia   DM (diabetes mellitus) type 2, uncontrolled, with ketoacidosis   Nausea with vomiting   Fever   UTI (lower urinary tract infection)   Wound eschar of foot    Time spent: > 35 minutes    Velvet Bathe  [facility] Pager [phone] If 7PM-7AM, please contact night-coverage at [email or web address], password [facility] 10/02/2012, 6:31 PM  LOS: 6 days

## 2012-10-02 NOTE — Progress Notes (Signed)
[facility] ROUNDING NOTE  S: Pt says he is feeling better.  (Wife says his thinking is off)  C/o phantom pain O:BP 118/53  Pulse 74  Temp(Src) 98.7 F (37.1 C) (Oral)  Resp 20  Ht 5' 10.87" (1.8 m)  Wt 74.2 kg (163 lb 9.3 oz)  BMI 22.9 kg/m2  SpO2 98%  Intake/Output Summary (Last 24 hours) at 10/02/12 1416 Last data filed at 10/02/12 1300  Gross per 24 hour  Intake    960 ml  Output   1750 ml  Net   -790 ml   Weight change: -2.5 kg (-5 lb 8.2 oz) [id number] and alert, up in the chair CVS:RRR Resp:clear Abd:+ BS NTND Ext:bil BKA's with both stumps wrapped NEURO:CNI OX3 no asterixis PICC line in right arm  Medications: . antiseptic oral rinse  15 mL Mouth Rinse BID  . aspirin  325 mg Oral BID  . docusate sodium  100 mg Oral BID  . enoxaparin (LOVENOX) injection  30 mg Subcutaneous Q24H  . feeding supplement  237 mL Oral TID BM  . feeding supplement (NEPRO CARB STEADY)  237 mL Oral BID BM  . fluconazole  100 mg Oral QHS  . hydrALAZINE  50 mg Oral Q8H  . insulin aspart  0-9 Units Subcutaneous TID WC  . metoCLOPramide  5 mg Oral TID AC  . metoprolol tartrate  25 mg Oral BID  . pantoprazole  40 mg Oral q morning - 10a  . polyethylene glycol  17 g Oral Daily   . sodium chloride 20 mL/hr at 10/01/12 0017   Dg Chest Port 1 View  10/01/2012   *RADIOLOGY REPORT*  Clinical Data: Fever.  PORTABLE CHEST - 1 VIEW  Comparison: 09/26/2012  Findings: Stable positioning of PICC line in the SVC.  No edema, infiltrate or pleural fluid is seen.  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  IMPRESSION: No active disease.   Original Report Authenticated By: Aletta Edouard, M.D.   Basic Metabolic Panel:  Recent Labs  10/01/12 0505 10/02/12 0455  NA 133* 134*  K 3.7 3.9  CL 98 100  CO2 21 21  GLUCOSE 172* 145*  BUN 49* 51*  CREATININE 4.34* 4.30*  CALCIUM 8.2* 8.3*  PHOS 5.8* 6.1*   Liver Function Tests:  Recent Labs  10/01/12 0505 10/02/12 0455  ALBUMIN  1.3* 1.3*   CBC:  Recent Labs  10/01/12 0505 10/01/12 2238  WBC 11.3* 11.3*  NEUTROABS  --  8.9*  HGB 7.8* 7.7*  HCT 22.4* 23.0*  MCV 78.6 79.6  PLT 355 331     AssessmentRecommendations:  62 yo BM with DM, HTN, AKI on CKD following recent RLE BKA, readmitted with gangrenous LLE now s/p L BKA with associated blood loss anemia, hypotension   Acute on CKD with BL creatinine 1.68 on admission Creatinine may be reaching plateau Not uremic, no dialysis indications Adequate UOP  SP Lt BKA (7/23) and R BKA (7/2) Ortho following  DM  Insulin per primary  HTN Good on hydralazine alone  Anemia Check iron studies Start darbepoetin  Yeast UTI On fluconazole   Yasmine Kilbourne B

## 2012-10-02 NOTE — Progress Notes (Signed)
Patient requests all 4 side rails to be up,patient is alert and oriented x4.Wife at bedside. Aeriana Speece Joselita,RN

## 2012-10-02 NOTE — Progress Notes (Signed)
Utilization review completed.  

## 2012-10-02 NOTE — Progress Notes (Signed)
PATIENT ID: [name]  MRN: [id number]  DOB/AGE:  March 02, 1951 / 62 y.o.  5 Days Post-Op Procedure(s) (LRB): AMPUTATION BELOW KNEE (Left)    PROGRESS NOTE Subjective:   Patient is alert, oriented, no Nausea, no Vomiting, yes passing gas, yes Bowel Movement. Taking PO well, pt up in bed eating. Denies SOB, Chest or Calf Pain. Using [employer]. Patient reports pain as mild, Pt seems to be in better spirits today.  HE did spike a fever last night.    Objective: Vital signs in last 24 hours: Temp:  [97.7 F (36.5 C)-101.9 F (38.8 C)] 97.7 F (36.5 C) (07/28 0945) Pulse Rate:  [55-97] 67 (07/28 0945) Resp:  [18-20] 20 (07/28 0945) BP: (110-142)/(52-62) 129/55 mmHg (07/28 0945) SpO2:  [97 %-100 %] 99 % (07/28 0945) Weight:  [74.2 kg (163 lb 9.3 oz)] 74.2 kg (163 lb 9.3 oz) (07/27 2056)    Intake/Output from previous day: I/O last 3 completed shifts: In: 1333.7 [P.O.:600; I.V.:733.7] Out: 1550 [Urine:1550]   Intake/Output this shift: Total I/O In: 120 [P.O.:120] Out: -    LABORATORY DATA:  Recent Labs  10/01/12 0505  10/01/12 1702 10/01/12 2100 10/01/12 2238 10/02/12 0455 10/02/12 0805  WBC 11.3*  --   --   --  11.3*  --   --   HGB 7.8*  --   --   --  7.7*  --   --   HCT 22.4*  --   --   --  23.0*  --   --   PLT 355  --   --   --  331  --   --   NA 133*  --   --   --   --  134*  --   K 3.7  --   --   --   --  3.9  --   CL 98  --   --   --   --  100  --   CO2 21  --   --   --   --  21  --   BUN 49*  --   --   --   --  51*  --   CREATININE 4.34*  --   --   --   --  4.30*  --   GLUCOSE 172*  --   --   --   --  145*  --   GLUCAP  --   < > 203* 122*  --   --  130*  CALCIUM 8.2*  --   --   --   --  8.3*  --   < > = values in this interval not displayed.  Examination: Neurologically intact ABD soft Neurovascular intact Incision: scant drainage No cellulitis present Pt has mild drainage from the bandage. It has not soaked through the ace  bandage.}  Assessment:   5 Days Post-Op Procedure(s) (LRB): AMPUTATION BELOW KNEE (Left) ADDITIONAL DIAGNOSIS:  Diabetes, Hypertension, Renal Insufficiency Chronic, Renal Insufficiency Acute and UTI during Hospitalization Fever -  Followed by internal medicine and nephrology.  Plan:  Pt has bilateral BKA, must use wheelchair  DISCHARGE PLAN: Skilled Nursing Facility/[facility] when cleared by medicine  Continue to follow treatment plan of internal medicine and nephrology.      Ehan Freas R 10/02/2012, 9:49 AM

## 2012-10-02 NOTE — Clinical Social Work Psychosocial (Signed)
Clinical Social Work Department BRIEF PSYCHOSOCIAL ASSESSMENT 10/02/2012  Patient:  Skirvin,Reise     Account Number:  [id number]     Admit date:  09/26/2012  Clinical Social Worker:  Frederico Hamman  Date/Time:  10/02/2012 04:47 AM  Referred by:  Physician  Date Referred:  09/26/2012 Referred for  SNF Placement   Other Referral:   Interview type:  Patient Other interview type:   CSW talked with patient and his wife [name], who was at the bedside.    PSYCHOSOCIAL DATA Living Status:  WIFE Admitted from facility:  [facility] Level of care:   Primary support name:  Roderick Mcmanigal Primary support relationship to patient:  SPOUSE Degree of support available:   Wife at the bedside    CURRENT CONCERNS Current Concerns  Post-Acute Placement   Other Concerns:    SOCIAL WORK ASSESSMENT / PLAN CSW talked with wife and patient (patient no n-verbal during conversation) regarding discharge plans and asked to confirm return to [facility]. Wife stated that patient will return to [facility] to continue his rehab.  CSW explained CSW role in patient returning to facility.   Assessment/plan status:  Psychosocial Support/Ongoing Assessment of Needs Other assessment/ plan:   Information/referral to community resources:    PATIENT'S/FAMILY'S RESPONSE TO PLAN OF CARE: D/C plan is back to facility. Wife expressed appreciation for visit and nformation.

## 2012-10-03 LAB — RENAL FUNCTION PANEL
Albumin: 1.4 g/dL — ABNORMAL LOW (ref 3.5–5.2)
BUN: 50 mg/dL — ABNORMAL HIGH (ref 6–23)
Calcium: 8.2 mg/dL — ABNORMAL LOW (ref 8.4–10.5)
Chloride: 101 mEq/L (ref 96–112)
Creatinine, Ser: 4.31 mg/dL — ABNORMAL HIGH (ref 0.50–1.35)
GFR calc non Af Amer: 13 mL/min — ABNORMAL LOW (ref >90)

## 2012-10-03 LAB — CULTURE, BLOOD (ROUTINE X 2): Culture: NO GROWTH

## 2012-10-03 LAB — IRON AND TIBC: UIBC: 37 ug/dL — ABNORMAL LOW (ref 125–400)

## 2012-10-03 LAB — GLUCOSE, CAPILLARY

## 2012-10-03 LAB — FERRITIN: Ferritin: 3930 ng/mL — ABNORMAL HIGH (ref 22–322)

## 2012-10-03 LAB — CBC
Hemoglobin: 7.1 g/dL — ABNORMAL LOW (ref 13.0–17.0)
MCH: 27.1 pg (ref 26.0–34.0)
MCHC: 34.1 g/dL (ref 30.0–36.0)
MCV: 79.4 fL (ref 78.0–100.0)
Platelets: 393 10*3/uL (ref 150–400)
RBC: 2.62 MIL/uL — ABNORMAL LOW (ref 4.22–5.81)

## 2012-10-03 NOTE — Progress Notes (Signed)
Physical Therapy Treatment Patient Details Name: [name] MRN: [id number] DOB: 10-27-1950 Today's Date: 10/03/2012 Time: [id number] PT Time Calculation (min): 28 min  PT Assessment / Plan / Recommendation  History of Present Illness        PT Comments   Pt with head down upon entering room and declined therapy at first however encouraged pt to transfer to w/c to get out of room for a little bit.  Pt fatigues quickly and requires increased time.  Pt reports he has been performing his exercises, and therapist reminded pt these are important if he plans to use prosthesis. Session limited by fatigue and nausea.     Follow Up Recommendations  SNF;Supervision/Assistance - 24 hour     Does the patient have the potential to tolerate intense rehabilitation     Barriers to Discharge        Equipment Recommendations  None recommended by PT    Recommendations for Other Services    Frequency     Progress towards PT Goals Progress towards PT goals: Progressing toward goals  Plan Current plan remains appropriate    Precautions / Restrictions Precautions Precautions: Fall Restrictions Other Position/Activity Restrictions: bil BKA   Pertinent Vitals/Pain 8/10 L LE pain, nsg tech notified of pain med request and pt nausea (to inform RN as Therapist, sports in progression meeting)    Mobility  Bed Mobility Details for Bed Mobility Assistance: verbal cues for technique, increased time Transfers Transfers: Lateral/Scoot Transfers Lateral/Scoot Transfers: With armrests removed;4: Min guard Details for Transfer Assistance: increased time for scooting, level surface, upon return back to room pt placed w/c beside recliner however requested to sit up in w/c for awhile due to nausea Ambulation/Gait Ambulation/Gait Assistance: Not tested (comment) Wheelchair Mobility Wheelchair Mobility: Yes Wheelchair Assistance: 5: Supervision Wheelchair Assistance Details (indicate cue type and reason): verbal cues for  objects, requires assist with armrest and planning transfers into w/c (set up), uses brakes well, requires increased time, pt fatigues quickly, pt required rest break however stated unable to propel back to room due to nausea Wheelchair Propulsion: Both upper extremities Wheelchair Parts Management: Needs assistance Distance: 40    Exercises     PT Diagnosis:    PT Problem List:   PT Treatment Interventions:     PT Goals (current goals can now be found in the care plan section)    Visit Information  Last PT Received On: 10/03/12 Assistance Needed: +1    Subjective Data      Cognition  Cognition Arousal/Alertness: Awake/alert Behavior During Therapy: [facility] for tasks assessed/performed Overall Cognitive Status: Within Functional Limits for tasks assessed    Balance     End of Session PT - End of Session Activity Tolerance: Patient limited by fatigue Patient left: in chair;with call bell/phone within reach;with family/visitor present   GP     Daleon Willinger,KATHrine E 10/03/2012, 10:41 AM [name], PT, DPT 10/03/2012 Pager: [phone]

## 2012-10-03 NOTE — Progress Notes (Signed)
[name] ROUNDING NOTE  S: Pt looks depressed.   Sits in the chair with his head hanging Some nausea earlier "can't get with the food here" O:BP 127/57  Pulse 53  Temp(Src) 98.9 F (37.2 C) (Oral)  Resp 20  Ht 5' 10.87" (1.8 m)  Wt 71.532 kg (157 lb 11.2 oz)  BMI 22.08 kg/m2  SpO2 100%  Intake/Output Summary (Last 24 hours) at 10/03/12 1248 Last data filed at 10/03/12 0919  Gross per 24 hour  Intake    820 ml  Output    850 ml  Net    -30 ml   Weight change: -2.668 kg (-5 lb 14.1 oz) [id number] and alert, up in the chair, but flat affect Looks dry [id number] S1S2 No rub Resp:clear lungs bilat Abd:+ BS NTND Ext:bil BKA's with both stumps wrapped NEURO:CNI OX3 no asterixis PICC line in right arm with KVO fluids infusing  10/02/12 2116 71.532 kg   10/01/12 2056 74.2 kg   09/30/12 2133 76.7 kg   09/29/12 2114 75.4 kg 09/28/12 2057 86.6 kg  09/26/12 2115 86.6 kg   Medications: . antiseptic oral rinse  15 mL Mouth Rinse BID  . aspirin  325 mg Oral BID  . darbepoetin (ARANESP) injection - NON-DIALYSIS  150 mcg Subcutaneous Q Mon-1800  . docusate sodium  100 mg Oral BID  . enoxaparin (LOVENOX) injection  30 mg Subcutaneous Q24H  . feeding supplement  237 mL Oral TID BM  . feeding supplement (NEPRO CARB STEADY)  237 mL Oral BID BM  . fluconazole  100 mg Oral QHS  . hydrALAZINE  50 mg Oral Q8H  . insulin aspart  0-9 Units Subcutaneous TID WC  . metoCLOPramide  5 mg Oral TID AC  . metoprolol tartrate  25 mg Oral BID  . pantoprazole  40 mg Oral q morning - 10a  . polyethylene glycol  17 g Oral Daily   . sodium chloride 20 mL/hr at 10/01/12 0017   Dg Chest Port 1 View  10/01/2012   *RADIOLOGY REPORT*  Clinical Data: Fever.  PORTABLE CHEST - 1 VIEW  Comparison: 09/26/2012  Findings: Stable positioning of PICC line in the SVC.  No edema, infiltrate or pleural fluid is seen.  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  IMPRESSION: No active disease.    Original Report Authenticated By: Aletta Edouard, M.D.   Basic Metabolic Panel:  Recent Labs  10/02/12 0455 10/03/12 0626  NA 134* 135  K 3.9 3.6  CL 100 101  CO2 21 20  GLUCOSE 145* 128*  BUN 51* 50*  CREATININE 4.30* 4.31*  CALCIUM 8.3* 8.2*  PHOS 6.1* 5.5*   Liver Function Tests:  Recent Labs  10/02/12 0455 10/03/12 0626  ALBUMIN 1.3* 1.4*   CBC:  Recent Labs  10/01/12 2238 10/03/12 0626  WBC 11.3* 10.1  NEUTROABS 8.9*  --   HGB 7.7* 7.1*  HCT 23.0* 20.8*  MCV 79.6 79.4  PLT 331 393     AssessmentRecommendations:  62 yo BM with DM, HTN, AKI on CKD (baseline 1.68 on adm)  following recent (7/2) RLE BKA, readmitted with gangrenous 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) with associated blood loss anemia, hypotension   Acute on CKD  BL creatinine 1.68 on admission Creatinine has reached plateau at around 4.3 Not uremic, no dialysis indications Adequate UOP With marginal po intake feel should at least give 50/hour IVF for a day or two until po picks up (weight is down significantly  past few days as above)  SP Lt BKA (7/23) and R BKA (7/2) Ortho following Wounds looked ok per ortho today (7/29)  DM  Insulin per primary  HTN Good on hydralazine alone  Anemia TSat 91/ferritin high Started darbepoetin 7/28 (150/week) Has been transfused 2 units since adm   Yeast UTI On fluconazole   [name]

## 2012-10-03 NOTE — Progress Notes (Signed)
10/03/12 19:20 Patient request to have  4 side rails up. Hanadi Stanly Joselita,RN

## 2012-10-03 NOTE — Progress Notes (Signed)
OT Cancellation Note  Patient Details Name: [name] MRN: [id number] DOB: 07-01-1950   Cancelled Treatment:    Reason Eval/Treat Not Completed: Pain limiting ability to participate Pt just transferred back to bed from his wheelchair declined participating in OT treatment at this time.  Will check back tomorrow 10/04/12.  Amorie Rentz 10/03/2012, 1:41 PM

## 2012-10-03 NOTE — Progress Notes (Signed)
PATIENT ID: [name]  MRN: [id number]  DOB/AGE:  [birth year] / 62 y.o.  6 Days Post-Op Procedure(s) (LRB): AMPUTATION BELOW KNEE (Left)    PROGRESS NOTE Subjective:   Patient is alert, oriented, no Nausea, no Vomiting, yes passing gas, yes Bowel Movement. Taking PO well, pt up eating. Denies SOB, Chest or Calf Pain. Using Incentive Spirometer, PAS in place. Pt must use assistive device, wheelchair, Patient reports pain as mild,     Objective: Vital signs in last 24 hours: Temp:  [97.7 F (36.5 C)-100.1 F (37.8 C)] 98.7 F (37.1 C) (07/29 0500) Pulse Rate:  [67-80] 70 (07/29 0500) Resp:  [18-20] 20 (07/29 0500) BP: (115-129)/(53-57) 115/57 mmHg (07/29 0500) SpO2:  [98 %-100 %] 100 % (07/29 0500) Weight:  [71.532 kg (157 lb 11.2 oz)] 71.532 kg (157 lb 11.2 oz) (07/28 2116)    Intake/Output from previous day: I/O last 3 completed shifts: In: 1195.7 [P.O.:940; I.V.:255.7] Out: 2150 [Urine:2150]   Intake/Output this shift:     LABORATORY DATA:  Recent Labs  10/01/12 2238 10/02/12 0455  10/02/12 1217 10/02/12 1657 10/02/12 2116 10/03/12 0626  WBC 11.3*  --   --   --   --   --  10.1  HGB 7.7*  --   --   --   --   --  7.1*  HCT 23.0*  --   --   --   --   --  20.8*  PLT 331  --   --   --   --   --  393  NA  --  134*  --   --   --   --  135  K  --  3.9  --   --   --   --  3.6  CL  --  100  --   --   --   --  101  CO2  --  21  --   --   --   --  20  BUN  --  51*  --   --   --   --  50*  CREATININE  --  4.30*  --   --   --   --  4.31*  GLUCOSE  --  145*  --   --   --   --  128*  GLUCAP  --   --   < > 140* 174* 110*  --   CALCIUM  --  8.3*  --   --   --   --  8.2*  < > = values in this interval not displayed.  Examination: Neurologically intact ABD soft Neurovascular intact Sensation intact distally No cellulitis present Pt has no signs of cellulitis. No odor from LBKA.}  Assessment:   6 Days Post-Op Procedure(s) (LRB): AMPUTATION BELOW KNEE (Left) ADDITIONAL  DIAGNOSIS:  Diabetes, Hypertension, Renal Insufficiency Chronic, Renal Insufficiency Acute and UTI during Hospitalization  Pt has recurrent fever, with concerns for UTI. Currently on FLuconazole  Plan:  Pt will be using a wheel chair for mobility.  He has expressed interest in prosthesis and we will look into an appt for this.  DVT Prophylaxis:  Aspirin 325 daily, pt also on lovenox  DISCHARGE PLAN: Skilled Nursing Facility/Rehab  Pt cleared for D/C from an ortho standpoint.  Continue to follow treatment plan of nephrology and medicine.     PHILLIPS, ERIC R 10/03/2012, 8:11 AM

## 2012-10-03 NOTE — Progress Notes (Signed)
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 Ortho on board as well as nephrology for ARF with creatinine platue at ~ 4.3 currently   Assessment/Plan:  #1 fever  - resolving on fluconazole for yeast in urine culture.  #2 left lower extremity malodorous wounds/? Gangrenous lower extremity  - Ortho on board and has cleared from an ortho standpoint. Please review their recommendations listed on note on 10/03/12 - Pt is s/p L BKA  #3 hypertension  - Stable and relatively well controlled. Continue home regimen of hydralazine, metoprolol.   #4 UTI  - Growing yeast. Placed on fluconazole.  #5 well controlled type 2 diabetes  - Hemoglobin A1c was 5.9 on 09/06/2012. - Continue SSI  #6 anemia  Likely anemia of chronic disease.  - transfused 2 units of PRBC since hgb 6.5 initially.  - hgb trending down.  Will reassess next am if hgb < 7.0 would transfuse. Discussed with patient who informs me he is agreeable to blood transfusion should he require it.  #7 leukocytosis  Resolving currently while on fluconazole   #8 history of CVA  On aspirin 325 mg daily.   #9 nausea and vomiting  - Resolved patient on reglan. - tolerating renal diet  #10 Acute on chronic kidney disease  Baseline creatinine 1.2-1.4.  - Renal ultrasound reportedly showing no hydronephrosis, BL echogenic kidneys consistent with medical renal disease.  - Nephrology on board and making further evaluation and recommendations. - Monitoring S creatinine daily  #11 prophylaxis  PPI for GI prophylaxis. Lovenox for DVT prophylaxis.   Code Status: Full  Family Communication: patient and family member at bedside. Disposition Plan: With improvement in  WBC levels and S creatinine. Will transition to SNF once cleared medically.  Consultants:  Ortho: Dr. Mayer Camel  Nephrology Dr. Mercy Moore  Procedures:  None  Antibiotics:  Vancomycin and Zosyn d/c 7/25  Antifungal Fluconazole 10/01/12  HPI/Subjective: No new complaints.  No acute issues overnight.  Objective: Filed Vitals:   10/02/12 2116 10/03/12 0500 10/03/12 0814 10/03/12 1410  BP: 123/54 115/57 127/57 133/70  Pulse: 80 70 53 61  Temp: 100.1 F (37.8 C) 98.7 F (37.1 C) 98.9 F (37.2 C) 98 F (36.7 C)  TempSrc: Oral Oral Oral Oral  Resp: 18 20 20 20   Height:      Weight: 71.532 kg (157 lb 11.2 oz)     SpO2: 99% 100% 100% 100%    Intake/Output Summary (Last 24 hours) at 10/03/12 1646 Last data filed at 10/03/12 1500  Gross per 24 hour  Intake 1507.5 ml  Output    850 ml  Net  657.5 ml   Filed Weights   09/30/12 2133 10/01/12 2056 10/02/12 2116  Weight: 76.7 kg (169 lb 1.5 oz) 74.2 kg (163 lb 9.3 oz) 71.532 kg (157 lb 11.2 oz)    Exam:   General:  Pt in NAD, Alert and awake  Cardiovascular: RRR, no MRG  Respiratory: CTA BL, no wheezes  Abdomen: soft, NT, ND  Musculoskeletal: Left and right BKA.   Data Reviewed: Basic Metabolic Panel:  Recent Labs Lab 09/27/12 0500  09/29/12 0600 09/30/12 0456 10/01/12 0505 10/02/12 0455 10/03/12 0626  NA 140  < > 138 133* 133* 134* 135  K  3.8  < > 3.6 3.9 3.7 3.9 3.6  CL 101  < > 101 97 98 100 101  CO2 23  < > 23 22 21 21 20   GLUCOSE 173*  < > 40* 129* 172* 145* 128*  BUN 24*  < > 39* 43* 49* 51* 50*  CREATININE 1.98*  < > 3.70* 4.07* 4.34* 4.30* 4.31*  CALCIUM 8.6  < > 8.3* 8.0* 8.2* 8.3* 8.2*  MG 2.0  --   --   --   --   --   --   PHOS  --   --   --  5.0* 5.8* 6.1* 5.5*  < > = values in this interval not displayed. Liver Function Tests:  Recent Labs Lab 09/30/12 0456 10/01/12 0505 10/02/12 0455 10/03/12 0626  ALBUMIN 1.3* 1.3* 1.3* 1.4*   No results found for this basename: LIPASE, AMYLASE,   in the last 168 hours No results found for this basename: AMMONIA,  in the last 168 hours CBC:  Recent Labs Lab 09/29/12 0600 09/30/12 0456 10/01/12 0505 10/01/12 2238 10/03/12 0626  WBC 17.0* 14.7* 11.3* 11.3* 10.1  NEUTROABS  --   --   --  8.9*  --   HGB 8.4* 8.0* 7.8* 7.7* 7.1*  HCT 23.9* 23.2* 22.4* 23.0* 20.8*  MCV 79.7 78.6 78.6 79.6 79.4  PLT 320 319 355 331 393   Cardiac Enzymes: No results found for this basename: CKTOTAL, CKMB, CKMBINDEX, TROPONINI,  in the last 168 hours BNP (last 3 results) No results found for this basename: PROBNP,  in the last 8760 hours CBG:  Recent Labs Lab 10/02/12 1217 10/02/12 1657 10/02/12 2116 10/03/12 0817 10/03/12 1228  GLUCAP 140* 174* 110* 124* 144*    Recent Results (from the past 240 hour(s))  CULTURE, BLOOD (ROUTINE X 2)     Status: None   Collection Time    09/26/12  7:14 PM      Result Value Range Status   Specimen Description BLOOD ARM LEFT   Final   Special Requests BOTTLES DRAWN AEROBIC ONLY 10CC   Final   Culture  Setup Time 09/27/2012 04:13   Final   Culture NO GROWTH 5 DAYS   Final   Report Status 10/03/2012 FINAL   Final  CULTURE, BLOOD (ROUTINE X 2)     Status: None   Collection Time    09/26/12  7:34 PM      Result Value Range Status   Specimen Description BLOOD FOREARM LEFT   Final   Special Requests BOTTLES DRAWN AEROBIC ONLY 10CC   Final   Culture  Setup Time 09/27/2012 04:13   Final   Culture NO GROWTH 5 DAYS   Final   Report Status 10/03/2012 FINAL   Final  MRSA PCR SCREENING     Status: None   Collection Time    09/26/12  8:49 PM      Result Value Range Status   MRSA by PCR NEGATIVE  NEGATIVE Final   Comment:            The GeneXpert MRSA Assay (FDA     approved for NASAL specimens     only), is one component of a     comprehensive MRSA colonization     surveillance program. It is not     intended to diagnose MRSA     infection nor to guide or     monitor treatment for     MRSA  infections.  SURGICAL PCR  SCREEN     Status: None   Collection Time    09/27/12 11:50 AM      Result Value Range Status   MRSA, PCR NEGATIVE  NEGATIVE Final   Staphylococcus aureus NEGATIVE  NEGATIVE Final   Comment:            The Xpert SA Assay (FDA     approved for NASAL specimens     in patients over 51 years of age),     is one component of     a comprehensive surveillance     program.  Test performance has     been validated by [employer] for patients greater     than or equal to 35 year old.     It is not intended     to diagnose infection nor to     guide or monitor treatment.  URINE CULTURE     Status: None   Collection Time    09/28/12  6:41 AM      Result Value Range Status   Specimen Description URINE, RANDOM   Final   Special Requests NONE   Final   Culture  Setup Time 09/28/2012 10:37   Final   Colony Count >=100,000 COLONIES/ML   Final   Culture     Final   Value: Multiple bacterial morphotypes present, none predominant. Suggest appropriate recollection if clinically indicated.   Report Status 09/29/2012 FINAL   Final  URINE CULTURE     Status: None   Collection Time    09/29/12  2:17 PM      Result Value Range Status   Specimen Description URINE, CLEAN CATCH   Final   Special Requests NONE   Final   Culture  Setup Time 09/29/2012 16:16   Final   Colony Count >=100,000 COLONIES/ML   Final   Culture YEAST   Final   Report Status 09/30/2012 FINAL   Final  CULTURE, BLOOD (ROUTINE X 2)     Status: None   Collection Time    10/01/12 10:30 PM      Result Value Range Status   Specimen Description BLOOD LEFT ARM   Final   Special Requests BOTTLES DRAWN AEROBIC AND ANAEROBIC 10CC EA   Final   Culture  Setup Time 10/02/2012 10:38   Final   Culture     Final   Value:        BLOOD CULTURE RECEIVED NO GROWTH TO DATE CULTURE WILL BE HELD FOR 5 DAYS BEFORE ISSUING A FINAL NEGATIVE REPORT   Report Status PENDING   Incomplete  CULTURE, BLOOD (ROUTINE X 2)      Status: None   Collection Time    10/01/12 10:35 PM      Result Value Range Status   Specimen Description BLOOD LEFT HAND   Final   Special Requests BOTTLES DRAWN AEROBIC ONLY 5CC   Final   Culture  Setup Time 10/02/2012 10:38   Final   Culture     Final   Value:        BLOOD CULTURE RECEIVED NO GROWTH TO DATE CULTURE WILL BE HELD FOR 5 DAYS BEFORE ISSUING A FINAL NEGATIVE REPORT   Report Status PENDING   Incomplete     Studies: Dg Chest Port 1 View  10/01/2012   *RADIOLOGY REPORT*  Clinical Data: Fever.  PORTABLE CHEST - 1 VIEW  Comparison: 09/26/2012  Findings: Stable positioning of PICC line in the SVC.  No edema, infiltrate or pleural fluid is seen.  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  IMPRESSION: No active disease.   Original Report Authenticated By: Aletta Edouard, M.D.    Scheduled Meds: . antiseptic oral rinse  15 mL Mouth Rinse BID  . aspirin  325 mg Oral BID  . darbepoetin (ARANESP) injection - NON-DIALYSIS  150 mcg Subcutaneous Q Mon-1800  . docusate sodium  100 mg Oral BID  . enoxaparin (LOVENOX) injection  30 mg Subcutaneous Q24H  . feeding supplement  237 mL Oral TID BM  . feeding supplement (NEPRO CARB STEADY)  237 mL Oral BID BM  . fluconazole  100 mg Oral QHS  . hydrALAZINE  50 mg Oral Q8H  . insulin aspart  0-9 Units Subcutaneous TID WC  . metoCLOPramide  5 mg Oral TID AC  . metoprolol tartrate  25 mg Oral BID  . pantoprazole  40 mg Oral q morning - 10a  . polyethylene glycol  17 g Oral Daily   Continuous Infusions: . sodium chloride 50 mL/hr at 10/03/12 1405    Principal Problem:   Gangrene of foot Left Active Problems:   H/O: CVA (cerebrovascular accident)   Hypertension   Anemia   DM (diabetes mellitus) type 2, uncontrolled, with ketoacidosis   Nausea with vomiting   Fever   UTI (lower urinary tract infection)   Wound eschar of foot    Time spent: > 35 minutes    Velvet Bathe  [facility] Pager [phone] If 7PM-7AM, please  contact night-coverage at [email or web address], password [facility] 10/03/2012, 4:46 PM  LOS: 7 days

## 2012-10-04 DIAGNOSIS — S88119A Complete traumatic amputation at level between knee and ankle, unspecified lower leg, initial encounter: Secondary | ICD-10-CM

## 2012-10-04 LAB — GLUCOSE, CAPILLARY: Glucose-Capillary: 130 mg/dL — ABNORMAL HIGH (ref 70–99)

## 2012-10-04 LAB — RENAL FUNCTION PANEL
Albumin: 1.4 g/dL — ABNORMAL LOW (ref 3.5–5.2)
CO2: 19 mEq/L (ref 19–32)
Chloride: 104 mEq/L (ref 96–112)
GFR calc Af Amer: 16 mL/min — ABNORMAL LOW (ref >90)
GFR calc non Af Amer: 14 mL/min — ABNORMAL LOW (ref >90)
Potassium: 3.8 mEq/L (ref 3.5–5.1)

## 2012-10-04 LAB — CBC
Platelets: 415 10*3/uL — ABNORMAL HIGH (ref 150–400)
RBC: 2.55 MIL/uL — ABNORMAL LOW (ref 4.22–5.81)
WBC: 11 10*3/uL — ABNORMAL HIGH (ref 4.0–10.5)

## 2012-10-04 MED ORDER — FLUOXETINE HCL 20 MG PO CAPS
20.0000 mg | ORAL_CAPSULE | Freq: Every day | ORAL | Status: DC
  Administered 2012-10-04 – 2012-10-19 (×9): 20 mg via ORAL
  Filled 2012-10-04 (×16): qty 1

## 2012-10-04 MED ORDER — BOOST / RESOURCE BREEZE PO LIQD
1.0000 | Freq: Two times a day (BID) | ORAL | Status: DC
  Administered 2012-10-04 – 2012-10-16 (×8): 1 via ORAL

## 2012-10-04 MED ORDER — METOCLOPRAMIDE HCL 10 MG PO TABS
10.0000 mg | ORAL_TABLET | Freq: Three times a day (TID) | ORAL | Status: DC
  Administered 2012-10-04 – 2012-10-07 (×8): 10 mg via ORAL
  Filled 2012-10-04 (×11): qty 1

## 2012-10-04 MED ORDER — PROCHLORPERAZINE MALEATE 5 MG PO TABS
5.0000 mg | ORAL_TABLET | Freq: Four times a day (QID) | ORAL | Status: DC | PRN
  Filled 2012-10-04: qty 1

## 2012-10-04 NOTE — Progress Notes (Signed)
[name] ROUNDING NOTE  S:  Remains depressed and somewhat withdrawn Sits in the chair with his head hanging Still having nausea Getting IVF at 50/hour and says "feel a little better" but still with phantom pain  O:BP 130/60  Pulse 77  Temp(Src) 99.7 F (37.6 C) (Oral)  Resp 20  Ht 5' 10.87" (1.8 m)  Wt 74.254 kg (163 lb 11.2 oz)  BMI 22.92 kg/m2  SpO2 98%  Intake/Output Summary (Last 24 hours) at 10/04/12 1327 Last data filed at 10/04/12 0753  Gross per 24 hour  Intake 1506.67 ml  Output   1400 ml  Net 106.67 ml   Weight change: 2.722 kg (6 lb) [id number] and alert, up in the chair, but flat affect [id number] No rub Resp:clear lungs bilat Abd:+ BS NTND Ext:bil BKA's with both stumps wrapped NEURO:CNI OX3 no asterixis PICC line in right arm with KVO fluids infusing  Weights: 10/03/12 2218 74.254 kg  10/02/12 2116 71.532 kg  10/01/12 2056 74.2 kg   09/30/12 2133 76.7 kg   09/29/12 2114 75.4 kg 09/28/12 2057 86.6 kg  09/26/12 2115 86.6 kg   Medications: . antiseptic oral rinse  15 mL Mouth Rinse BID  . aspirin  325 mg Oral BID  . darbepoetin (ARANESP) injection - NON-DIALYSIS  150 mcg Subcutaneous Q Mon-1800  . docusate sodium  100 mg Oral BID  . enoxaparin (LOVENOX) injection  30 mg Subcutaneous Q24H  . feeding supplement  1 Container Oral BID BM  . fluconazole  100 mg Oral QHS  . hydrALAZINE  50 mg Oral Q8H  . insulin aspart  0-9 Units Subcutaneous TID WC  . metoCLOPramide  5 mg Oral TID AC  . metoprolol tartrate  25 mg Oral BID  . pantoprazole  40 mg Oral q morning - 10a  . polyethylene glycol  17 g Oral Daily   . sodium chloride 50 mL/hr at 10/03/12 2052   No results found. Basic Metabolic Panel:  Recent Labs  10/03/12 0626 10/04/12 0602  NA 135 138  K 3.6 3.8  CL 101 104  CO2 20 19  GLUCOSE 128* 133*  BUN 50* 48*  CREATININE 4.31* 4.24*  CALCIUM 8.2* 8.5  PHOS 5.5* 6.3*   Liver Function Tests:  Recent Labs  10/03/12 0626 10/04/12 0602  ALBUMIN 1.4* 1.4*   CBC:  Recent Labs  10/01/12 2238 10/03/12 0626 10/04/12 0602  WBC 11.3* 10.1 11.0*  NEUTROABS 8.9*  --   --   HGB 7.7* 7.1* 7.0*  HCT 23.0* 20.8* 20.5*  MCV 79.6 79.4 80.4  PLT 331 393 415*     Assessment/Recommendations:  62 yo BM with DM, HTN, AKI on CKD (baseline 1.68 on adm)  following recent (7/2) RLE BKA, readmitted with gangrenous 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) with associated blood loss anemia, hypotension and recurrent AKI on CKD  Acute on CKD  BL creatinine 1.68 on admission Creatinine has reached plateau low 4's No dialysis indications Adequate UOP With marginal po intake feel should continue to give 50/hour IVF for a day or two until po picks up (weight is down significantly past few days as above)  SP Lt BKA (7/23) and R BKA (7/2) Ortho following Wounds looked ok per ortho  (7/29)  DM  Insulin per primary  HTN Good on hydralazine alone  Anemia TSat 91/ferritin high Started darbepoetin 7/28 (150/week) Has been transfused 2 units since adm  May need additional blood if falls to <7  Yeast UTI  On fluconazole  Nausea ? Gastroparesis Doubt uremic On Reglan - increase to 10 mg  [name] B

## 2012-10-04 NOTE — Progress Notes (Signed)
Patient ID: [name], male   DOB: September 10, 1950, 62 y.o.   MRN: [id number] Subjective: Patient resting in bed reports pain is controlled with oral medications. He is still being worked up for his renal failure, has had some nausea and vomiting, and still has anemia.  Objective: The right BKA stump is healed there is no sign of inflammation or infection. This procedure was done over a month ago. The left BKA stump dressing is dry, there is no malodor, the suture line is intact.  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 The wound on the right is healed the wound on the left is healing nicely.  Plan: The patient will need to return to my office in about a week for staple removal, or if he is still in the hospital it may be done a week from now. Once his medical condition is stabilized physical therapy to assist in getting from bed to wheelchair is reasonable. We discussed the possibility of artificial limbs which he would like to pursue. The timing on out will be approximately one month from now when both of the BKA stumps are matured and able to except some weight. We will try to get that set up through [facility] here in [place] on an outpatient basis. We will sign off for now.

## 2012-10-04 NOTE — Progress Notes (Signed)
10/03/12 23:30 Patient vomited after consuming glucerna,it also happened the previous night. Carliss Quast Joselita,RN

## 2012-10-04 NOTE — Progress Notes (Signed)
Occupational Therapy Treatment Patient Details Name: [name] MRN: [id number] DOB: 20-Oct-1950 Today's Date: 10/04/2012 Time: [id number] OT Time Calculation (min): 39 min  OT Assessment / Plan / Recommendation  History of present illness It was noted that patient had presented from his orthopod's office, Dr. Damita Dunnings office to the ED. I spoke with Dr. Grandville Silos from Dr. Damita Dunnings office who was on call and was told that the hospitalist service had been called to admit this patient in preparation for surgery for amputation of the left lower extremity later on this week once patient's medical issues have been stabilized. Pt s/p Left below the knee amputation 09/27/12   OT comments  Pt with flat affect. Apparent cognitive deficits - most likely baseline. Participated with BUE strengthening with theraband, which was left in room. Encouraged pt to complete on his on. Pt soaked with urine in recliner. Pt not aware of incontinence. Patient continues to be unsafe to discharge home alone and will benefit from continued post-acute therapy. Currently recommending skilled nursing facility for rehab.     Follow Up Recommendations  SNF    Barriers to Discharge       Equipment Recommendations       Recommendations for Other Services    Frequency Min 2X/week   Progress towards OT Goals Progress towards OT goals: Progressing toward goals  Plan Discharge plan remains appropriate    Precautions / Restrictions Precautions Precautions: Fall Restrictions Weight Bearing Restrictions: No LLE Weight Bearing: Non weight bearing Other Position/Activity Restrictions: bil BKA   Pertinent Vitals/Pain Did not rate but c/o minimal pain    ADL  Lower Body Bathing: Moderate assistance Where Assessed - Lower Body Bathing: Rolling right and/or left Transfers/Ambulation Related to ADLs: mod A reclnier to bed ADL Comments: difficulty maintaining NWB via L LE    OT Diagnosis:    OT Problem List:   OT Treatment  Interventions:     OT Goals(current goals can now be found in the care plan section) Acute Rehab OT Goals OT Goal Formulation: With patient Time For Goal Achievement: 10/12/12 Potential to Achieve Goals: Good ADL Goals Pt Will Transfer to Toilet: with min guard assist;anterior/posterior transfer;bedside commode Pt/caregiver will Perform Home Exercise Program: For increased strengthening;Independently  Visit Information  Last OT Received On: 10/04/12 Assistance Needed: +1 History of Present Illness: It was noted that patient had presented from his orthopod's office, Dr. Damita Dunnings office to the ED. I spoke with Dr. Grandville Silos from Dr. Damita Dunnings office who was on call and was told that the hospitalist service had been called to admit this patient in preparation for surgery for amputation of the left lower extremity later on this week once patient's medical issues have been stabilized. Pt s/p Left below the knee amputation 09/27/12    Subjective Data      Prior Functioning       Cognition  Cognition Arousal/Alertness: Awake/alert Behavior During Therapy: Flat affect Overall Cognitive Status: History of cognitive impairments - at baseline    Mobility  Transfers Details for Transfer Assistance: more difficulty with recliner to bed. increased time required    Exercises  General Exercises - Upper Extremity Shoulder Flexion: Strengthening;Both;10 reps;Seated;Theraband Theraband Level (Shoulder Flexion): Level 1 (Yellow) Shoulder Extension: Strengthening;10 reps;Seated;Theraband Theraband Level (Shoulder Extension): Level 1 (Yellow) Shoulder ABduction: Strengthening;Both;10 reps;Seated;Theraband Theraband Level (Shoulder Abduction): Level 1 (Yellow) Elbow Flexion: Strengthening;Both;10 reps;Seated;Theraband Theraband Level (Elbow Flexion): Level 1 (Yellow) Elbow Extension: Strengthening;Both;10 reps;Seated;Theraband Theraband Level (Elbow Extension): Level 1 (Yellow) Chair Push Up: 5  reps  Balance  static sitting SBA. Dynamic sitting minguard   End of Session OT - End of Session Activity Tolerance: Patient tolerated treatment well Patient left: in bed;with call bell/phone within reach;with family/visitor present Nurse Communication: Mobility status  GO     Keajah Killough,HILLARY 10/04/2012, 4:34 PM [facility], OTR/L  [phone] 10/04/2012

## 2012-10-04 NOTE — Progress Notes (Addendum)
[facility] PROGRESS NOTE  [name] [id number] DOB: February 08, 1951 DOA: 09/26/2012 PCP: No PCP Per Patient Assessment/Plan:  #1 fever  -no further fever. -will stop diflucan after discussing with ID.  #2 left lower extremity malodorous wounds/? Gangrenous lower extremity  - Ortho on board and has cleared from an ortho standpoint. Please review their recommendations listed on note on 10/03/12 - Pt is s/p L BKA  #3 hypertension  - Stable and relatively well controlled. Continue home regimen of hydralazine, metoprolol.   #4 UTI  - Growing yeast.  -per ID recommendations and given ongoing renal function problems no need of diflucan  #5 well controlled type 2 diabetes  - Hemoglobin A1c was 5.9 on 09/06/2012. - Continue SSI  #6 anemia  Likely anemia of chronic disease.  - transfused 2 units of PRBC since hgb 6.5 initially.  - hgb trending down.  Will reassess next am if hgb < 7.0 would transfuse.   #7 leukocytosis  -Resolving currently  -after discussing with ID (Dr. Johnnye Sima) no need for treatment of yeast in urine  #8 history of CVA  -continue aspirin 325 mg daily.   #9 nausea and vomiting  -still present -reglan increased to 10mg  TID -started on compazine  #10 Acute on chronic kidney disease  Baseline creatinine 1.2-1.4.  - Renal ultrasound reportedly showing no hydronephrosis, BL echogenic kidneys consistent with medical renal disease.  - Nephrology on board and making further evaluation and recommendations. - Monitoring S creatinine daily -currently plateau around 4's range (slowly improving)  #11 Depression: -will start prozac  prophylaxis  PPI for GI prophylaxis. Lovenox for DVT prophylaxis.   Code Status: Full  Family Communication: patient and family member at bedside. Disposition Plan: With improvement in WBC levels and S creatinine. Will transition to SNF once cleared medically.  Consultants:  Ortho: Dr. Mayer Camel  Nephrology Dr.  Mercy Moore  Procedures:  None  Antibiotics:  Vancomycin and Zosyn d/c 7/25  Antifungal Fluconazole 10/01/12--10/04/12  HPI/Subjective: No new complaints.  No acute issues overnight. Patient continue to have nausea/vomiting intermittently and flat mood  Objective: Filed Vitals:   10/03/12 2218 10/04/12 0547 10/04/12 0805 10/04/12 1442  BP: 124/53 129/55 130/60 122/54  Pulse: 85 78 77 71  Temp: 100.4 F (38 C) 99.6 F (37.6 C) 99.7 F (37.6 C) 98.6 F (37 C)  TempSrc: Oral Oral Oral Oral  Resp: 20 18 20 20   Height:      Weight: 74.254 kg (163 lb 11.2 oz)     SpO2: 97% 97% 98% 98%    Intake/Output Summary (Last 24 hours) at 10/04/12 1651 Last data filed at 10/04/12 1600  Gross per 24 hour  Intake   1647 ml  Output   1775 ml  Net   -128 ml   Filed Weights   10/01/12 2056 10/02/12 2116 10/03/12 2218  Weight: 74.2 kg (163 lb 9.3 oz) 71.532 kg (157 lb 11.2 oz) 74.254 kg (163 lb 11.2 oz)    Exam:   General:  Pt in NAD, Alert and awake; flat mood  Cardiovascular: RRR, no MRG  Respiratory: CTA BL, no wheezes  Abdomen: soft, NT, ND  Musculoskeletal: Left and right BKA. No edema   Data Reviewed: Basic Metabolic Panel:  Recent Labs Lab 09/30/12 0456 10/01/12 0505 10/02/12 0455 10/03/12 0626 10/04/12 0602  NA 133* 133* 134* 135 138  K 3.9 3.7 3.9 3.6 3.8  CL 97 98 100 101 104  CO2 22 21 21 20 19   GLUCOSE 129* 172* 145*  128* 133*  BUN 43* 49* 51* 50* 48*  CREATININE 4.07* 4.34* 4.30* 4.31* 4.24*  CALCIUM 8.0* 8.2* 8.3* 8.2* 8.5  PHOS 5.0* 5.8* 6.1* 5.5* 6.3*   Liver Function Tests:  Recent Labs Lab 09/30/12 0456 10/01/12 0505 10/02/12 0455 10/03/12 0626 10/04/12 0602  ALBUMIN 1.3* 1.3* 1.3* 1.4* 1.4*   CBC:  Recent Labs Lab 09/30/12 0456 10/01/12 0505 10/01/12 2238 10/03/12 0626 10/04/12 0602  WBC 14.7* 11.3* 11.3* 10.1 11.0*  NEUTROABS  --   --  8.9*  --   --   HGB 8.0* 7.8* 7.7* 7.1* 7.0*  HCT 23.2* 22.4* 23.0* 20.8* 20.5*  MCV  78.6 78.6 79.6 79.4 80.4  PLT 319 355 331 393 415*   CBG:  Recent Labs Lab 10/03/12 1228 10/03/12 1640 10/03/12 2221 10/04/12 0758 10/04/12 1136  GLUCAP 144* 144* 122* 130* 150*    Recent Results (from the past 240 hour(s))  CULTURE, BLOOD (ROUTINE X 2)     Status: None   Collection Time    09/26/12  7:14 PM      Result Value Range Status   Specimen Description BLOOD ARM LEFT   Final   Special Requests BOTTLES DRAWN AEROBIC ONLY 10CC   Final   Culture  Setup Time 09/27/2012 04:13   Final   Culture NO GROWTH 5 DAYS   Final   Report Status 10/03/2012 FINAL   Final  CULTURE, BLOOD (ROUTINE X 2)     Status: None   Collection Time    09/26/12  7:34 PM      Result Value Range Status   Specimen Description BLOOD FOREARM LEFT   Final   Special Requests BOTTLES DRAWN AEROBIC ONLY 10CC   Final   Culture  Setup Time 09/27/2012 04:13   Final   Culture NO GROWTH 5 DAYS   Final   Report Status 10/03/2012 FINAL   Final  MRSA PCR SCREENING     Status: None   Collection Time    09/26/12  8:49 PM      Result Value Range Status   MRSA by PCR NEGATIVE  NEGATIVE Final   Comment:            The GeneXpert MRSA Assay (FDA     approved for NASAL specimens     only), is one component of a     comprehensive MRSA colonization     surveillance program. It is not     intended to diagnose MRSA     infection nor to guide or     monitor treatment for     MRSA infections.  SURGICAL PCR SCREEN     Status: None   Collection Time    09/27/12 11:50 AM      Result Value Range Status   MRSA, PCR NEGATIVE  NEGATIVE Final   Staphylococcus aureus NEGATIVE  NEGATIVE Final   Comment:            The Xpert SA Assay (FDA     approved for NASAL specimens     in patients over 44 years of age),     is one component of     a comprehensive surveillance     program.  Test performance has     been validated by [employer] for patients greater     than or equal to 78 year old.     It is not intended      to diagnose infection nor to  guide or monitor treatment.  URINE CULTURE     Status: None   Collection Time    09/28/12  6:41 AM      Result Value Range Status   Specimen Description URINE, RANDOM   Final   Special Requests NONE   Final   Culture  Setup Time 09/28/2012 10:37   Final   Colony Count >=100,000 COLONIES/ML   Final   Culture     Final   Value: Multiple bacterial morphotypes present, none predominant. Suggest appropriate recollection if clinically indicated.   Report Status 09/29/2012 FINAL   Final  URINE CULTURE     Status: None   Collection Time    09/29/12  2:17 PM      Result Value Range Status   Specimen Description URINE, CLEAN CATCH   Final   Special Requests NONE   Final   Culture  Setup Time 09/29/2012 16:16   Final   Colony Count >=100,000 COLONIES/ML   Final   Culture YEAST   Final   Report Status 09/30/2012 FINAL   Final  CULTURE, BLOOD (ROUTINE X 2)     Status: None   Collection Time    10/01/12 10:30 PM      Result Value Range Status   Specimen Description BLOOD LEFT ARM   Final   Special Requests BOTTLES DRAWN AEROBIC AND ANAEROBIC 10CC EA   Final   Culture  Setup Time 10/02/2012 10:38   Final   Culture     Final   Value:        BLOOD CULTURE RECEIVED NO GROWTH TO DATE CULTURE WILL BE HELD FOR 5 DAYS BEFORE ISSUING A FINAL NEGATIVE REPORT   Report Status PENDING   Incomplete  CULTURE, BLOOD (ROUTINE X 2)     Status: None   Collection Time    10/01/12 10:35 PM      Result Value Range Status   Specimen Description BLOOD LEFT HAND   Final   Special Requests BOTTLES DRAWN AEROBIC ONLY 5CC   Final   Culture  Setup Time 10/02/2012 10:38   Final   Culture     Final   Value:        BLOOD CULTURE RECEIVED NO GROWTH TO DATE CULTURE WILL BE HELD FOR 5 DAYS BEFORE ISSUING A FINAL NEGATIVE REPORT   Report Status PENDING   Incomplete     Studies: No results found.  Scheduled Meds: . antiseptic oral rinse  15 mL Mouth Rinse BID  . aspirin  325 mg Oral  BID  . darbepoetin (ARANESP) injection - NON-DIALYSIS  150 mcg Subcutaneous Q Mon-1800  . docusate sodium  100 mg Oral BID  . enoxaparin (LOVENOX) injection  30 mg Subcutaneous Q24H  . feeding supplement  1 Container Oral BID BM  . FLUoxetine  20 mg Oral Daily  . hydrALAZINE  50 mg Oral Q8H  . insulin aspart  0-9 Units Subcutaneous TID WC  . metoCLOPramide  10 mg Oral TID AC  . metoprolol tartrate  25 mg Oral BID  . pantoprazole  40 mg Oral q morning - 10a  . polyethylene glycol  17 g Oral Daily   Continuous Infusions: . sodium chloride 50 mL/hr at 10/04/12 1503    Principal Problem:   Gangrene of foot Left Active Problems:   H/O: CVA (cerebrovascular accident)   Hypertension   Anemia   DM (diabetes mellitus) type 2, uncontrolled, with ketoacidosis   Nausea with vomiting   Fever  UTI (lower urinary tract infection)   Wound eschar of foot    Time spent: > 30 minutes    Ikey Omary  [facility] Pager [phone] If 7PM-7AM, please contact night-coverage at [email or web address], password [facility] 10/04/2012, 4:51 PM  LOS: 8 days

## 2012-10-04 NOTE — Progress Notes (Signed)
PHARMACY NOTE  Pharmacy Consult for :  Diflucan  Indication:  Fungal UTI  Hospital Problems Principal Problem:   Gangrene of foot Left Active Problems:   H/O: CVA (cerebrovascular accident)   Hypertension   Anemia   DM (diabetes mellitus) type 2, uncontrolled, with ketoacidosis   Nausea with vomiting   Fever   UTI (lower urinary tract infection)   Wound eschar of foot   Weight: 74 kg  Vitals: BP 130/60  Pulse 77  Temp(Src) 99.7 F (37.6 C) (Oral)  Resp 20  Ht 5' 10.87" (1.8 m)  Wt 163 lb 11.2 oz (74.254 kg)  BMI 22.92 kg/m2  SpO2 98%  Labs:  Recent Labs  10/02/12 0455 10/03/12 0626 10/04/12 0602  WBC  --  10.1 11.0*  HGB  --  7.1* 7.0*  PLT  --  393 415*  CREATININE 4.30* 4.31* 4.24*   Estimated Creatinine Clearance: 19 ml/min (by C-G formula based on Cr of 4.24).   Microbiology: URINE CULTURE     Status: None   Collection Time    09/29/12  2:17 PM      Result Value Range Status   Specimen Description URINE, CLEAN CATCH   Final   Special Requests NONE   Final   Culture  Setup Time 09/29/2012 16:16   Final   Colony Count >=100,000 COLONIES/ML   Final   Culture YEAST   Final   Report Status 09/30/2012 FINAL   Final  CULTURE, BLOOD (ROUTINE X 2)     Status: None   Collection Time    10/01/12 10:30 PM    Anti-infectives Anti-infectives   Start     Dose/Rate Route Frequency Ordered Stop   10/02/12 2200  fluconazole (DIFLUCAN) tablet 100 mg     100 mg Oral Daily at bedtime 10/01/12 1652     10/01/12 1700  fluconazole (DIFLUCAN) tablet 200 mg     200 mg Oral  Once 10/01/12 1652 10/01/12 1712     Assessment:  Day # 4 Diflucan for fungal UTI.  Renal function stable.  WBC 11.  Goal of Therapy:   Renal adjustment of Diflucan.  Plan:   Continue Diflucan 100 mg po daily.  Estelle June, Pharm.D.  10/04/2012 12:16 PM

## 2012-10-04 NOTE — Progress Notes (Signed)
NUTRITION FOLLOW UP  Intervention:   Discontinue Nepro Shakes and Glucerna Shakes - pt cannot tolerate these. Add Resource Breeze po BID, each supplement provides 250 kcal and 9 grams of protein. Consider diet liberalization to provide additional PO choices. RD to continue to follow nutrition care plan.  Nutrition Dx:   Inadequate oral intake related to nausea and vomiting as evidenced by pt report. Ongoing.  Goal:   Patient will meet >/=90% of estimated nutrition needs. Unmet.  Monitor:   PO intake, diet advancement, weight, labs  Assessment:   Patient admitted with gangrene of foot. He is recently s/p right below knee amputation on 09/06/2012.   S/p L-BKA 7/23.  Phosphorus remains elevated; potassium WNL. Continues on Renal diet.  Pt confirms that oral intake remains poor. Does not like Glucerna Shakes or Nepro Supplements. Has vomited after consuming them. Pt providing very limited information at this time, short answers to questions.  Height: Ht Readings from Last 1 Encounters:  09/26/12 5' 10.87" (1.8 m)    Weight Status:   Wt Readings from Last 1 Encounters:  10/03/12 163 lb 11.2 oz (74.254 kg)  Wt down 27 lb x 1 week; noted BKA.  Re-estimated needs:  Kcal: 2050-2200 Protein: 90 - 110 g  Fluid: 2 liters daily  Skin:  Thigh incision R leg incision  Diet Order: Renal 80-90; 1200 ml fluid restriction   Intake/Output Summary (Last 24 hours) at 10/04/12 0928 Last data filed at 10/04/12 0753  Gross per 24 hour  Intake 1506.67 ml  Output   1400 ml  Net 106.67 ml    Last BM: 7/28   Labs:   Recent Labs Lab 10/02/12 0455 10/03/12 0626 10/04/12 0602  NA 134* 135 138  K 3.9 3.6 3.8  CL 100 101 104  CO2 21 20 19   BUN 51* 50* 48*  CREATININE 4.30* 4.31* 4.24*  CALCIUM 8.3* 8.2* 8.5  PHOS 6.1* 5.5* 6.3*  GLUCOSE 145* 128* 133*    CBG (last 3)   Recent Labs  10/03/12 1640 10/03/12 2221 10/04/12 0758  GLUCAP 144* 122* 130*    Scheduled  Meds: . antiseptic oral rinse  15 mL Mouth Rinse BID  . aspirin  325 mg Oral BID  . darbepoetin (ARANESP) injection - NON-DIALYSIS  150 mcg Subcutaneous Q Mon-1800  . docusate sodium  100 mg Oral BID  . enoxaparin (LOVENOX) injection  30 mg Subcutaneous Q24H  . feeding supplement  237 mL Oral TID BM  . feeding supplement (NEPRO CARB STEADY)  237 mL Oral BID BM  . fluconazole  100 mg Oral QHS  . hydrALAZINE  50 mg Oral Q8H  . insulin aspart  0-9 Units Subcutaneous TID WC  . metoCLOPramide  5 mg Oral TID AC  . metoprolol tartrate  25 mg Oral BID  . pantoprazole  40 mg Oral q morning - 10a  . polyethylene glycol  17 g Oral Daily    Continuous Infusions: . sodium chloride 50 mL/hr at 10/03/12 2052    Inda Coke MS, RD, LDN Pager: [phone] After-hours pager: [phone]

## 2012-10-05 DIAGNOSIS — F331 Major depressive disorder, recurrent, moderate: Secondary | ICD-10-CM | POA: Diagnosis present

## 2012-10-05 DIAGNOSIS — F329 Major depressive disorder, single episode, unspecified: Secondary | ICD-10-CM

## 2012-10-05 DIAGNOSIS — R630 Anorexia: Secondary | ICD-10-CM

## 2012-10-05 LAB — RENAL FUNCTION PANEL
Albumin: 1.6 g/dL — ABNORMAL LOW (ref 3.5–5.2)
BUN: 44 mg/dL — ABNORMAL HIGH (ref 6–23)
Calcium: 8.5 mg/dL (ref 8.4–10.5)
Glucose, Bld: 189 mg/dL — ABNORMAL HIGH (ref 70–99)
Phosphorus: 6.3 mg/dL — ABNORMAL HIGH (ref 2.3–4.6)

## 2012-10-05 LAB — CBC
HCT: 20.9 % — ABNORMAL LOW (ref 39.0–52.0)
MCH: 27.4 pg (ref 26.0–34.0)
MCHC: 34 g/dL (ref 30.0–36.0)
MCV: 80.7 fL (ref 78.0–100.0)
RDW: 17.7 % — ABNORMAL HIGH (ref 11.5–15.5)

## 2012-10-05 LAB — GLUCOSE, CAPILLARY
Glucose-Capillary: 174 mg/dL — ABNORMAL HIGH (ref 70–99)
Glucose-Capillary: 227 mg/dL — ABNORMAL HIGH (ref 70–99)
Glucose-Capillary: 173 mg/dL — ABNORMAL HIGH (ref 70–99)

## 2012-10-05 NOTE — Progress Notes (Signed)
Patient requests to have all four side rails up. [name] Joselita,RN

## 2012-10-05 NOTE — Progress Notes (Signed)
[facility] PROGRESS NOTE  [name] [id number] DOB: [birth year] DOA: 09/26/2012 PCP: No PCP Per Patient Assessment/Plan:  #1 fever  -no further fever. -will stop diflucan after discussing with ID. -WBC's 11.3  #2 left lower extremity malodorous wounds/? Gangrenous lower extremity  - Ortho on board and has cleared from an ortho standpoint. Please review their recommendations listed on note on 10/03/12 - Pt is s/p L BKA  #3 hypertension  - Stable and relatively well controlled. Continue home regimen of hydralazine, metoprolol.   #4 UTI  - Growing yeast.  -per ID recommendations and given ongoing renal function problems no need of diflucan -he completed 3 days of treatment and is currently asymptomatic  #5 well controlled type 2 diabetes  - Hemoglobin A1c was 5.9 on 09/06/2012. - Continue SSI  #6 anemia  -Likely anemia of chronic disease.  - transfused 2 units of PRBC since hgb 6.5 initially.  - hgb trending down.  Will reassess next am if hgb < 7.0 would transfuse.   #7 leukocytosis  -Resolving currently  -after discussing with ID (Dr. Johnnye Sima) no need for treatment of yeast in urine. -patient completed 3 days treatment and currently is asymptomatic.  #8 history of CVA  -continue aspirin 325 mg daily.   #9 nausea and vomiting  -still present -reglan increased to 10mg  TID -continue compazine  #10 Acute on chronic kidney disease  Baseline creatinine 1.2-1.4.  - Renal ultrasound reportedly showing no hydronephrosis, BL echogenic kidneys consistent with medical renal disease.  - Nephrology on board and making further evaluation and recommendations. - Monitoring S creatinine daily -improved today and down to 3.9 range (slowly improving)  #11 Depression: -will start prozac -will also request psych consult and spiritual care consult as per family/patient request  prophylaxis  PPI for GI prophylaxis. Lovenox for DVT prophylaxis.   Code Status: Full   Family Communication: patient and family member at bedside. Disposition Plan: With improvement in WBC levels and Serum creatinine. Will transition to SNF [facility] once cleared medically.  Consultants:  Ortho: Dr. Mayer Camel  Nephrology Dr. Mercy Moore  Procedures:  None  Antibiotics:  Vancomycin and Zosyn d/c 7/25  Antifungal Fluconazole 10/01/12--10/04/12  HPI/Subjective: No new complaints.  No acute issues overnight. Patient continue to have nausea, but no further vomiting episodes. Patient and his family reported he is drinking a little more, but still not eating much. Flat affect and depressed mood remains   Objective: Filed Vitals:   10/04/12 2112 10/05/12 0057 10/05/12 0607 10/05/12 0851  BP: 133/57  132/52 137/57  Pulse: 83  70 74  Temp: 100 F (37.8 C) 99 F (37.2 C) 99.1 F (37.3 C) 98.9 F (37.2 C)  TempSrc: Oral Oral Oral   Resp: 20  18 18   Height:      Weight: 73.3 kg (161 lb 9.6 oz)     SpO2: 99%  99% 97%    Intake/Output Summary (Last 24 hours) at 10/05/12 1628 Last data filed at 10/05/12 1321  Gross per 24 hour  Intake 1095.83 ml  Output   2301 ml  Net -1205.17 ml   Filed Weights   10/02/12 2116 10/03/12 2218 10/04/12 2112  Weight: 71.532 kg (157 lb 11.2 oz) 74.254 kg (163 lb 11.2 oz) 73.3 kg (161 lb 9.6 oz)    Exam:   General:  Pt in NAD, Alert and awake; flat mood  Cardiovascular: RRR, no MRG  Respiratory: CTA BL, no wheezes  Abdomen: soft, NT, ND  Musculoskeletal: Left and right  BKA. No edema   Data Reviewed: Basic Metabolic Panel:  Recent Labs Lab 10/01/12 0505 10/02/12 0455 10/03/12 0626 10/04/12 0602 10/05/12 0515  NA 133* 134* 135 138 141  K 3.7 3.9 3.6 3.8 3.5  CL 98 100 101 104 108  CO2 21 21 20 19 20   GLUCOSE 172* 145* 128* 133* 189*  BUN 49* 51* 50* 48* 44*  CREATININE 4.34* 4.30* 4.31* 4.24* 3.97*  CALCIUM 8.2* 8.3* 8.2* 8.5 8.5  PHOS 5.8* 6.1* 5.5* 6.3* 6.3*   Liver Function Tests:  Recent Labs Lab  10/01/12 0505 10/02/12 0455 10/03/12 0626 10/04/12 0602 10/05/12 0515  ALBUMIN 1.3* 1.3* 1.4* 1.4* 1.6*   CBC:  Recent Labs Lab 10/01/12 0505 10/01/12 2238 10/03/12 0626 10/04/12 0602 10/05/12 0515  WBC 11.3* 11.3* 10.1 11.0* 11.5*  NEUTROABS  --  8.9*  --   --   --   HGB 7.8* 7.7* 7.1* 7.0* 7.1*  HCT 22.4* 23.0* 20.8* 20.5* 20.9*  MCV 78.6 79.6 79.4 80.4 80.7  PLT 355 331 393 415* 435*   CBG:  Recent Labs Lab 10/04/12 1136 10/04/12 1643 10/04/12 2114 10/05/12 0800 10/05/12 1054  GLUCAP 150* 210* 197* 176* 174*    Recent Results (from the past 240 hour(s))  CULTURE, BLOOD (ROUTINE X 2)     Status: None   Collection Time    09/26/12  7:14 PM      Result Value Range Status   Specimen Description BLOOD ARM LEFT   Final   Special Requests BOTTLES DRAWN AEROBIC ONLY 10CC   Final   Culture  Setup Time 09/27/2012 04:13   Final   Culture NO GROWTH 5 DAYS   Final   Report Status 10/03/2012 FINAL   Final  CULTURE, BLOOD (ROUTINE X 2)     Status: None   Collection Time    09/26/12  7:34 PM      Result Value Range Status   Specimen Description BLOOD FOREARM LEFT   Final   Special Requests BOTTLES DRAWN AEROBIC ONLY 10CC   Final   Culture  Setup Time 09/27/2012 04:13   Final   Culture NO GROWTH 5 DAYS   Final   Report Status 10/03/2012 FINAL   Final  MRSA PCR SCREENING     Status: None   Collection Time    09/26/12  8:49 PM      Result Value Range Status   MRSA by PCR NEGATIVE  NEGATIVE Final   Comment:            The GeneXpert MRSA Assay (FDA     approved for NASAL specimens     only), is one component of a     comprehensive MRSA colonization     surveillance program. It is not     intended to diagnose MRSA     infection nor to guide or     monitor treatment for     MRSA infections.  SURGICAL PCR SCREEN     Status: None   Collection Time    09/27/12 11:50 AM      Result Value Range Status   MRSA, PCR NEGATIVE  NEGATIVE Final   Staphylococcus aureus  NEGATIVE  NEGATIVE Final   Comment:            The Xpert SA Assay (FDA     approved for NASAL specimens     in patients over 58 years of age),     is one component of  a comprehensive surveillance     program.  Test performance has     been validated by [facility] for patients greater     than or equal to 51 year old.     It is not intended     to diagnose infection nor to     guide or monitor treatment.  URINE CULTURE     Status: None   Collection Time    09/28/12  6:41 AM      Result Value Range Status   Specimen Description URINE, RANDOM   Final   Special Requests NONE   Final   Culture  Setup Time 09/28/2012 10:37   Final   Colony Count >=100,000 COLONIES/ML   Final   Culture     Final   Value: Multiple bacterial morphotypes present, none predominant. Suggest appropriate recollection if clinically indicated.   Report Status 09/29/2012 FINAL   Final  URINE CULTURE     Status: None   Collection Time    09/29/12  2:17 PM      Result Value Range Status   Specimen Description URINE, CLEAN CATCH   Final   Special Requests NONE   Final   Culture  Setup Time 09/29/2012 16:16   Final   Colony Count >=100,000 COLONIES/ML   Final   Culture YEAST   Final   Report Status 09/30/2012 FINAL   Final  CULTURE, BLOOD (ROUTINE X 2)     Status: None   Collection Time    10/01/12 10:30 PM      Result Value Range Status   Specimen Description BLOOD LEFT ARM   Final   Special Requests BOTTLES DRAWN AEROBIC AND ANAEROBIC 10CC EA   Final   Culture  Setup Time 10/02/2012 10:38   Final   Culture     Final   Value:        BLOOD CULTURE RECEIVED NO GROWTH TO DATE CULTURE WILL BE HELD FOR 5 DAYS BEFORE ISSUING A FINAL NEGATIVE REPORT   Report Status PENDING   Incomplete  CULTURE, BLOOD (ROUTINE X 2)     Status: None   Collection Time    10/01/12 10:35 PM      Result Value Range Status   Specimen Description BLOOD LEFT HAND   Final   Special Requests BOTTLES DRAWN AEROBIC ONLY 5CC    Final   Culture  Setup Time 10/02/2012 10:38   Final   Culture     Final   Value:        BLOOD CULTURE RECEIVED NO GROWTH TO DATE CULTURE WILL BE HELD FOR 5 DAYS BEFORE ISSUING A FINAL NEGATIVE REPORT   Report Status PENDING   Incomplete     Studies: No results found.  Scheduled Meds: . antiseptic oral rinse  15 mL Mouth Rinse BID  . aspirin  325 mg Oral BID  . darbepoetin (ARANESP) injection - NON-DIALYSIS  150 mcg Subcutaneous Q Mon-1800  . docusate sodium  100 mg Oral BID  . enoxaparin (LOVENOX) injection  30 mg Subcutaneous Q24H  . feeding supplement  1 Container Oral BID BM  . FLUoxetine  20 mg Oral Daily  . hydrALAZINE  50 mg Oral Q8H  . insulin aspart  0-9 Units Subcutaneous TID WC  . metoCLOPramide  10 mg Oral TID AC  . metoprolol tartrate  25 mg Oral BID  . pantoprazole  40 mg Oral q morning - 10a  . polyethylene glycol  17  g Oral Daily   Continuous Infusions: . sodium chloride 50 mL/hr at 10/05/12 1206    Principal Problem:   Gangrene of foot Left Active Problems:   H/O: CVA (cerebrovascular accident)   Hypertension   Anemia   DM (diabetes mellitus) type 2, uncontrolled, with ketoacidosis   Nausea with vomiting   Fever   UTI (lower urinary tract infection)   Wound eschar of foot    Time spent: > 30 minutes    Jamaul Heist  [facility] Pager [phone] If 7PM-7AM, please contact night-coverage at [email or web address], password [facility] 10/05/2012, 4:28 PM  LOS: 9 days

## 2012-10-05 NOTE — Consult Note (Signed)
Reason for Consult: Depression Referring Physician: Dr. [name] is an 62 y.o. male.  HPI: Patient has had recent amputations of his legs and has depression. Church friend at his bedside and states his church is supportive of him.  He denies suicidal/homicidal ideations and auditory/visual hallucinations, would like to talk to someone about his stressors and grief over the loss of his legs and functional status.  [name] will discharge to rehab, [name], and then go to live with his sister in [place].  Prozac was started for his depression by his MD.  Dr. Dwyane Dee has evaluated this patient and agrees to the treatment plan noted below.  Past Medical History  Diagnosis Date  . Hypertension   . 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 Myocardial infarction   . Hx of seasonal allergies   . GERD (gastroesophageal reflux disease)   . High cholesterol   . Exertional shortness of breath     "before my last OR; I'm fine now" (09/26/2012)  . Type II diabetes mellitus     Past Surgical History  Procedure Laterality Date  . 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 Amputation Right 07/29/2012    Procedure: AMPUTATION 3RD TOE;  Surgeon: Kerin Salen, MD;  Location: [place];  Service: Orthopedics;  Laterality: Right;  right third toe amputation  . Amputation Right 08/02/2012    Procedure: right transmetatarsal amputation;  Surgeon: Kerin Salen, MD;  Location: [place];  Service: Orthopedics;  Laterality: Right;  . Cataract extraction w/ intraocular lens  implant, bilateral  ?2010  . Amputation Right 09/06/2012    Procedure: AMPUTATION BELOW KNEE;  Surgeon: Kerin Salen, MD;  Location: [place];  Service: Orthopedics;  Laterality: Right;  . Peripherally inserted central catheter insertion Right 09/2012    upper arm  . Amputation Left 09/27/2012    Procedure: AMPUTATION BELOW KNEE;  Surgeon: Kerin Salen, MD;  Location: [place];  Service: Orthopedics;  Laterality: Left;    History  reviewed. No pertinent family history.  Social History:  reports that he has quit smoking. His smoking use included Cigarettes. He has a 2.4 pack-year smoking history. He has never used smokeless tobacco. He reports that he does not drink alcohol or use illicit drugs.  Allergies: No Known Allergies  Medications: I have reviewed the patient's current medications.  Results for orders placed during the hospital encounter of 09/26/12 (from the past 48 hour(s))  GLUCOSE, CAPILLARY     Status: Abnormal   Collection Time    10/03/12 10:21 PM      Result Value Range   Glucose-Capillary 122 (*) 70 - 99 mg/dL  RENAL FUNCTION PANEL     Status: Abnormal   Collection Time    10/04/12  6:02 AM      Result Value Range   Sodium 138  135 - 145 mEq/L   Potassium 3.8  3.5 - 5.1 mEq/L   Chloride 104  96 - 112 mEq/L   CO2 19  19 - 32 mEq/L   Glucose, Bld 133 (*) 70 - 99 mg/dL   BUN 48 (*) 6 - 23 mg/dL   Creatinine, Ser 4.24 (*) 0.50 - 1.35 mg/dL   Calcium 8.5  8.4 - 10.5 mg/dL   Phosphorus 6.3 (*) 2.3 - 4.6 mg/dL   Albumin 1.4 (*) 3.5 - 5.2 g/dL   GFR calc non Af Amer 14 (*) >90 mL/min   GFR  calc Af Amer 16 (*) >90 mL/min   Comment:            The eGFR has been calculated     using the CKD EPI equation.     This calculation has not been     validated in all clinical     situations.     eGFR's persistently     <90 mL/min signify     possible Chronic Kidney Disease.  CBC     Status: Abnormal   Collection Time    10/04/12  6:02 AM      Result Value Range   WBC 11.0 (*) 4.0 - 10.5 K/uL   RBC 2.55 (*) 4.22 - 5.81 MIL/uL   Hemoglobin 7.0 (*) 13.0 - 17.0 g/dL   HCT 20.5 (*) 39.0 - 52.0 %   MCV 80.4  78.0 - 100.0 fL   MCH 27.5  26.0 - 34.0 pg   MCHC 34.1  30.0 - 36.0 g/dL   RDW 17.3 (*) 11.5 - 15.5 %   Platelets 415 (*) 150 - 400 K/uL  GLUCOSE, CAPILLARY     Status: Abnormal   Collection Time    10/04/12  7:58 AM      Result Value Range   Glucose-Capillary 130 (*) 70 - 99 mg/dL   Comment  1 Notify RN     Comment 2 Documented in Chart    GLUCOSE, CAPILLARY     Status: Abnormal   Collection Time    10/04/12 11:36 AM      Result Value Range   Glucose-Capillary 150 (*) 70 - 99 mg/dL  GLUCOSE, CAPILLARY     Status: Abnormal   Collection Time    10/04/12  4:43 PM      Result Value Range   Glucose-Capillary 210 (*) 70 - 99 mg/dL   Comment 1 Notify RN     Comment 2 Documented in Chart    GLUCOSE, CAPILLARY     Status: Abnormal   Collection Time    10/04/12  9:14 PM      Result Value Range   Glucose-Capillary 197 (*) 70 - 99 mg/dL  RENAL FUNCTION PANEL     Status: Abnormal   Collection Time    10/05/12  5:15 AM      Result Value Range   Sodium 141  135 - 145 mEq/L   Potassium 3.5  3.5 - 5.1 mEq/L   Chloride 108  96 - 112 mEq/L   CO2 20  19 - 32 mEq/L   Glucose, Bld 189 (*) 70 - 99 mg/dL   BUN 44 (*) 6 - 23 mg/dL   Creatinine, Ser 3.97 (*) 0.50 - 1.35 mg/dL   Calcium 8.5  8.4 - 10.5 mg/dL   Phosphorus 6.3 (*) 2.3 - 4.6 mg/dL   Albumin 1.6 (*) 3.5 - 5.2 g/dL   GFR calc non Af Amer 15 (*) >90 mL/min   GFR calc Af Amer 17 (*) >90 mL/min   Comment:            The eGFR has been calculated     using the CKD EPI equation.     This calculation has not been     validated in all clinical     situations.     eGFR's persistently     <90 mL/min signify     possible Chronic Kidney Disease.  CBC     Status: Abnormal   Collection Time    10/05/12  5:15 AM  Result Value Range   WBC 11.5 (*) 4.0 - 10.5 K/uL   RBC 2.59 (*) 4.22 - 5.81 MIL/uL   Hemoglobin 7.1 (*) 13.0 - 17.0 g/dL   HCT 20.9 (*) 39.0 - 52.0 %   MCV 80.7  78.0 - 100.0 fL   MCH 27.4  26.0 - 34.0 pg   MCHC 34.0  30.0 - 36.0 g/dL   RDW 17.7 (*) 11.5 - 15.5 %   Platelets 435 (*) 150 - 400 K/uL  GLUCOSE, CAPILLARY     Status: Abnormal   Collection Time    10/05/12  8:00 AM      Result Value Range   Glucose-Capillary 176 (*) 70 - 99 mg/dL  GLUCOSE, CAPILLARY     Status: Abnormal   Collection Time     10/05/12 10:54 AM      Result Value Range   Glucose-Capillary 174 (*) 70 - 99 mg/dL    No results found.  Review of Systems  Constitutional: Negative.   HENT: Negative.   Eyes: Negative.   Respiratory: Negative.   Cardiovascular: Negative.   Gastrointestinal: Negative.   Genitourinary: Negative.   Musculoskeletal:       Bilateral stump pain  Skin: Negative.   Neurological: Negative.   Endo/Heme/Allergies: Negative.   Psychiatric/Behavioral: Positive for depression.   Blood pressure 123/52, pulse 70, temperature 98.5 F (36.9 C), temperature source Oral, resp. rate 18, height 5' 10.87" (1.8 m), weight 73.3 kg (161 lb 9.6 oz), SpO2 100.00%. Physical Exam Completed by primary MD, reviewed  Family History:  No family history on file.  Mental Status Exam:  He is neat, fair eye contact, minimal conversations, decreased volume, depressed affect and mood.  He is calm and cooperative today, denies any active or passive suicidal thoughts or homicidal thoughts.  His thoughts are organized and goal directed.  There is no paranoia or delusions present at this time. He denies any auditory or visual hallucination. His attention and concentration is fair.  His insight and judgment are fair, impulse control intact.  DIAGNOSIS:  AXIS I  Major depressive disorder   AXIS II  Deferred   AXIS III  Multiple health issues, see above  AXIS IV  other psychosocial or environmental problems, inability to perform ADLs, problems with access to health care services and problems with primary support group   AXIS V  51-60 moderate symptoms    Assessment/Plan:  Recommend continue current psychiatric medication, Prozac that was started. Patient does not need inpatient psychiatric treatment. Recommend followup treatment for therapy and medications at local mental health facility or a private practice. Contact Education officer, museum for outpatient discharge planning.  Waylan Boga, [place] 10/05/2012, 5:18 PM     Patient seen, evaluated today. Patient is depressed and would benefit from the Prozac being continued.Patient denies suicidal ideation, any history of attempts. Patient is overwhelmed due to his medical issues but states that he wants to live and be independent. Patient does not require inpatient psychiatric care

## 2012-10-05 NOTE — Clinical Social Work Note (Signed)
CSW was requested at the room of patient, however family was not there when visit made. At the bedside was a friend of family/patient [name] [phone]) and when asked, patient gave CSW permission to talk with her. [name] explained that family wants patient at [facility] as they have a kitchen there which is a part of the therapy and [facility] does not have this. CSW expressed understanding of family's concerns, however explained that it is patient's decision to make. CSW asked patient where he wanted to go at discharge and he said back to [facility], then to [place] to live with his sister ([place], [place]; Ph [phone] and [phone]). CSW continued to talk with friend regarding rehab placement and expressed his family's concerns and desire for another placement. [name] stated again that his plan was to return to [facility] to complete his rehab, then go to [place] to live with his sister. [name] stated that she would relay this information to the family and expressed appreciation for CSW's visit.  [name], MSW, LCSW [phone]

## 2012-10-05 NOTE — Progress Notes (Signed)
[facility] ROUNDING NOTE  S:  Remains depressed and somewhat withdrawn Lying in bed with cobers pulled over his head Still getting IVF at 50/hour and looks less dry, urine output picking up and creatinine a little better Still c/o nausea and aversion to food  O:BP 137/57  Pulse 74  Temp(Src) 98.9 F (37.2 C) (Oral)  Resp 18  Ht 5' 10.87" (1.8 m)  Wt 73.3 kg (161 lb 9.6 oz)  BMI 22.62 kg/m2  SpO2 97%  Intake/Output Summary (Last 24 hours) at 10/05/12 1147 Last data filed at 10/05/12 0900  Gross per 24 hour  Intake 1563.66 ml  Output   2251 ml  Net -687.34 ml   Weight change: -0.954 kg (-2 lb 1.7 oz) [id number] in bed.  Flat affect.   [id number] S1S2 No rub Resp:clear lungs bilat Abd:+ BS NTND Ext:bil BKA's with both stumps wrapped NEURO:CNI OX3 no asterixis PICC line in right arm IVF running  Weights: 10/04/12 2112 73.3 kg  10/03/12 2218 74.254 kg  10/02/12 2116 71.532 kg  10/01/12 2056 74.2 kg   09/30/12 2133 76.7 kg   09/29/12 2114 75.4 kg 09/28/12 2057 86.6 kg  09/26/12 2115 86.6 kg   Medications: . antiseptic oral rinse  15 mL Mouth Rinse BID  . aspirin  325 mg Oral BID  . darbepoetin (ARANESP) injection - NON-DIALYSIS  150 mcg Subcutaneous Q Mon-1800  . docusate sodium  100 mg Oral BID  . enoxaparin (LOVENOX) injection  30 mg Subcutaneous Q24H  . feeding supplement  1 Container Oral BID BM  . FLUoxetine  20 mg Oral Daily  . hydrALAZINE  50 mg Oral Q8H  . insulin aspart  0-9 Units Subcutaneous TID WC  . metoCLOPramide  10 mg Oral TID AC  . metoprolol tartrate  25 mg Oral BID  . pantoprazole  40 mg Oral q morning - 10a  . polyethylene glycol  17 g Oral Daily   . 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) with associated blood loss anemia, hypotension and recurrent AKI on CKD as a result  Acute on CKD  - hemodynamically mediated as above BL creatinine 1.68 on admission - peaked at 4.3 Creatinine today  high 3's with slow imp last 3 days No dialysis indications Adequate UOP/non-oliguric  With marginal po intake feel should continue to give 50/hour IVF  until po picks up (weight is down significantly past few days as above)  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 Gastroparesis Doubt uremic On Reglan - increased to 10 mg 7/30   Dispo??   Margarine Grosshans B

## 2012-10-06 LAB — RENAL FUNCTION PANEL
CO2: 19 mEq/L (ref 19–32)
Calcium: 8.8 mg/dL (ref 8.4–10.5)
Chloride: 110 mEq/L (ref 96–112)
GFR calc Af Amer: 20 mL/min — ABNORMAL LOW (ref >90)
GFR calc non Af Amer: 18 mL/min — ABNORMAL LOW (ref >90)
Potassium: 4 mEq/L (ref 3.5–5.1)
Sodium: 144 mEq/L (ref 135–145)

## 2012-10-06 LAB — GLUCOSE, CAPILLARY: Glucose-Capillary: 187 mg/dL — ABNORMAL HIGH (ref 70–99)

## 2012-10-06 NOTE — Progress Notes (Signed)
Referred to pt by chaplain from last night. Pt has had BKA on each leg and is very discouraged. Pt is very softspoken. Pt stated he felt lonely even though medical staff is in and out. Pt stated that his pastor had been to see him. Chaplain provided empathic listening and prayer. Pt expressed belief that God still has a plan for him and will see him through rehab. Pt expressed appreciation for chaplain's visit.

## 2012-10-06 NOTE — Progress Notes (Signed)
Utilization review completed.  

## 2012-10-06 NOTE — Clinical Social Work Note (Signed)
Patient will be ready for discharge back to [facility] on Sat., 10/07/12. Facility contacted and patient can return on Saturday.  Shakela Donati Givens, MSW, LCSW [phone]

## 2012-10-06 NOTE — Progress Notes (Signed)
[facility] PROGRESS NOTE  Humza Marsiglia [id number] DOB: 01-03-1951 DOA: 09/26/2012 PCP: No PCP Per Patient Assessment/Plan:  #1 fever  -no further fever. -diflucan stopped on 7/30 after discussing with ID. -WBC's stable  #2 left lower extremity malodorous wounds/? Gangrenous lower extremity  - Ortho on board and has cleared from an ortho standpoint. Please review their recommendations listed on note on 10/03/12 - Pt is s/p L BKA  #3 hypertension  - Stable and relatively well controlled. Continue home regimen of hydralazine and metoprolol.   #4 UTI  -positive for yeast.  -per ID recommendations and given ongoing renal function problems no need of diflucan -he completed 3 days of treatment and is currently asymptomatic  #5 well controlled type 2 diabetes  - Hemoglobin A1c was 5.9 on 09/06/2012. - Continue SSI  #6 anemia  -Likely anemia of chronic disease.  - transfused 2 units of PRBC since admission  - if hgb < 7.0 would transfuse more. -continue aranesp   #7 leukocytosis  -Resolving currently  -after discussing with ID (Dr. Johnnye Sima) no need for treatment of yeast in urine. -patient completed 3 days treatment and currently is asymptomatic.  #8 history of CVA  -continue aspirin 325 mg daily.   #9 nausea and vomiting  -nausea still present -reglan 10mg  TID -continue PRN compazine -discussion about feeding tube if symptoms continue and he not eating; patient said he doesn't want TF and will do his best to eat more  #10 Acute on chronic kidney disease  Baseline creatinine 1.2-1.4.  - Renal ultrasound reportedly showing no hydronephrosis, BL echogenic kidneys consistent with medical renal disease.  - Nephrology on board and making further evaluation and recommendations. - Monitoring S creatinine daily -continue improving today and down to 3.4 range   #11 Depression: -will continue prozac -psychiatry service in agreement with drug of choice and recommending  outpatient follow up  prophylaxis  PPI for GI prophylaxis. Lovenox for DVT prophylaxis.   Code Status: Full  Family Communication: patient and family member at bedside. Disposition Plan: With improvement in WBC levels and Serum creatinine. Will transition to SNF [facility]) once cleared medically.  Consultants:  Ortho: Dr. Mayer Camel  Nephrology Dr. Mercy Moore  Procedures:  None  Antibiotics:  Vancomycin and Zosyn d/c 7/25  Antifungal Fluconazole 10/01/12--10/04/12  HPI/Subjective: No new complaints.  No acute issues overnight. Patient continue to have nausea, but no further vomiting episodes.  Flat affect and depressed mood remains   Objective: Filed Vitals:   10/06/12 0917 10/06/12 1357 10/06/12 1658 10/06/12 2043  BP: 134/72 140/57 138/62 157/67  Pulse: 92 84 85 81  Temp: 98.9 F (37.2 C) 98.7 F (37.1 C) 98.6 F (37 C) 99 F (37.2 C)  TempSrc:    Oral  Resp: 18 18 18 18   Height:      Weight:    73.653 kg (162 lb 6 oz)  SpO2: 96% 99% 98% 99%    Intake/Output Summary (Last 24 hours) at 10/06/12 2214 Last data filed at 10/06/12 2046  Gross per 24 hour  Intake 1424.17 ml  Output   2400 ml  Net -975.83 ml   Filed Weights   10/04/12 2112 10/05/12 2122 10/06/12 2043  Weight: 73.3 kg (161 lb 9.6 oz) 73.5 kg (162 lb 0.6 oz) 73.653 kg (162 lb 6 oz)    Exam:   General:  Pt in NAD, Alert and awake; flat mood  Cardiovascular: RRR, no MRG  Respiratory: CTA BL, no wheezes  Abdomen: soft, NT, ND  Musculoskeletal: Left and right BKA. No edema   Data Reviewed: Basic Metabolic Panel:  Recent Labs Lab 10/02/12 0455 10/03/12 0626 10/04/12 0602 10/05/12 0515 10/06/12 0500  NA 134* 135 138 141 144  K 3.9 3.6 3.8 3.5 4.0  CL 100 101 104 108 110  CO2 21 20 19 20 19   GLUCOSE 145* 128* 133* 189* 195*  BUN 51* 50* 48* 44* 39*  CREATININE 4.30* 4.31* 4.24* 3.97* 3.44*  CALCIUM 8.3* 8.2* 8.5 8.5 8.8  PHOS 6.1* 5.5* 6.3* 6.3* 5.8*   Liver Function  Tests:  Recent Labs Lab 10/02/12 0455 10/03/12 0626 10/04/12 0602 10/05/12 0515 10/06/12 0500  ALBUMIN 1.3* 1.4* 1.4* 1.6* 1.6*   CBC:  Recent Labs Lab 10/01/12 0505 10/01/12 2238 10/03/12 0626 10/04/12 0602 10/05/12 0515  WBC 11.3* 11.3* 10.1 11.0* 11.5*  NEUTROABS  --  8.9*  --   --   --   HGB 7.8* 7.7* 7.1* 7.0* 7.1*  HCT 22.4* 23.0* 20.8* 20.5* 20.9*  MCV 78.6 79.6 79.4 80.4 80.7  PLT 355 331 393 415* 435*   CBG:  Recent Labs Lab 10/05/12 2119 10/06/12 0822 10/06/12 1138 10/06/12 1656 10/06/12 2045  GLUCAP 173* 187* 167* 227* 170*    Recent Results (from the past 240 hour(s))  SURGICAL PCR SCREEN     Status: None   Collection Time    09/27/12 11:50 AM      Result Value Range Status   MRSA, PCR NEGATIVE  NEGATIVE Final   Staphylococcus aureus NEGATIVE  NEGATIVE Final   Comment:            The Xpert SA Assay (FDA     approved for NASAL specimens     in patients over 79 years of age),     is one component of     a comprehensive surveillance     program.  Test performance has     been validated by [employer] for patients greater     than or equal to 54 year old.     It is not intended     to diagnose infection nor to     guide or monitor treatment.  URINE CULTURE     Status: None   Collection Time    09/28/12  6:41 AM      Result Value Range Status   Specimen Description URINE, RANDOM   Final   Special Requests NONE   Final   Culture  Setup Time 09/28/2012 10:37   Final   Colony Count >=100,000 COLONIES/ML   Final   Culture     Final   Value: Multiple bacterial morphotypes present, none predominant. Suggest appropriate recollection if clinically indicated.   Report Status 09/29/2012 FINAL   Final  URINE CULTURE     Status: None   Collection Time    09/29/12  2:17 PM      Result Value Range Status   Specimen Description URINE, CLEAN CATCH   Final   Special Requests NONE   Final   Culture  Setup Time 09/29/2012 16:16   Final   Colony  Count >=100,000 COLONIES/ML   Final   Culture YEAST   Final   Report Status 09/30/2012 FINAL   Final  CULTURE, BLOOD (ROUTINE X 2)     Status: None   Collection Time    10/01/12 10:30 PM      Result Value Range Status   Specimen Description BLOOD LEFT ARM  Final   Special Requests BOTTLES DRAWN AEROBIC AND ANAEROBIC 10CC EA   Final   Culture  Setup Time 10/02/2012 10:38   Final   Culture     Final   Value:        BLOOD CULTURE RECEIVED NO GROWTH TO DATE CULTURE WILL BE HELD FOR 5 DAYS BEFORE ISSUING A FINAL NEGATIVE REPORT   Report Status PENDING   Incomplete  CULTURE, BLOOD (ROUTINE X 2)     Status: None   Collection Time    10/01/12 10:35 PM      Result Value Range Status   Specimen Description BLOOD LEFT HAND   Final   Special Requests BOTTLES DRAWN AEROBIC ONLY 5CC   Final   Culture  Setup Time 10/02/2012 10:38   Final   Culture     Final   Value:        BLOOD CULTURE RECEIVED NO GROWTH TO DATE CULTURE WILL BE HELD FOR 5 DAYS BEFORE ISSUING A FINAL NEGATIVE REPORT   Report Status PENDING   Incomplete     Studies: No results found.  Scheduled Meds: . antiseptic oral rinse  15 mL Mouth Rinse BID  . aspirin  325 mg Oral BID  . darbepoetin (ARANESP) injection - NON-DIALYSIS  150 mcg Subcutaneous Q Mon-1800  . docusate sodium  100 mg Oral BID  . enoxaparin (LOVENOX) injection  30 mg Subcutaneous Q24H  . feeding supplement  1 Container Oral BID BM  . FLUoxetine  20 mg Oral Daily  . hydrALAZINE  50 mg Oral Q8H  . insulin aspart  0-9 Units Subcutaneous TID WC  . metoCLOPramide  10 mg Oral TID AC  . metoprolol tartrate  25 mg Oral BID  . pantoprazole  40 mg Oral q morning - 10a  . polyethylene glycol  17 g Oral Daily   Continuous Infusions: . sodium chloride 50 mL/hr at 10/06/12 0710    Principal Problem:   Gangrene of foot Left Active Problems:   H/O: CVA (cerebrovascular accident)   Hypertension   Anemia   DM (diabetes mellitus) type 2, uncontrolled, with  ketoacidosis   Nausea with vomiting   Fever   UTI (lower urinary tract infection)   Wound eschar of foot   Major depressive disorder, recurrent episode, moderate    Time spent: > 30 minutes    Skylar Priest  [facility] Pager [phone] If 7PM-7AM, please contact night-coverage at [email or web address], password [place] 10/06/2012, 10:14 PM  LOS: 10 days

## 2012-10-06 NOTE — Progress Notes (Signed)
[facility] ROUNDING NOTE  S:  Remains depressed, nauseated, withdrawn Won't even try to eat Speaks to me in a whisper  O:BP 134/72  Pulse 92  Temp(Src) 98.9 F (37.2 C) (Oral)  Resp 18  Ht 5' 10.87" (1.8 m)  Wt 73.5 kg (162 lb 0.6 oz)  BMI 22.69 kg/m2  SpO2 96%  Intake/Output Summary (Last 24 hours) at 10/06/12 1311 Last data filed at 10/06/12 1242  Gross per 24 hour  Intake 2144.17 ml  Output   2702 ml  Net -557.83 ml   Weight change: 0.2 kg (7.1 oz) [id number] in bed.  Flat affect.   [id number] S1S2 No rub Resp:clear lungs bilat Abd:+ BS NTND Ext:bil BKA's with both stumps wrapped NEURO:CNI OX3 no asterixis PICC line in right arm IVF running  Weights: 10/05/12 2122 73.5 kg  10/04/12 2112 73.3 kg  10/03/12 2218 74.254 kg 10/02/12 2116 71.532 kg  10/01/12 2056 74.2 kg   09/30/12 2133 76.7 kg   09/29/12 2114 75.4 kg 09/28/12 2057 86.6 kg  09/26/12 2115 86.6 kg   Medications: . antiseptic oral rinse  15 mL Mouth Rinse BID  . aspirin  325 mg Oral BID  . darbepoetin (ARANESP) injection - NON-DIALYSIS  150 mcg Subcutaneous Q Mon-1800  . docusate sodium  100 mg Oral BID  . enoxaparin (LOVENOX) injection  30 mg Subcutaneous Q24H  . feeding supplement  1 Container Oral BID BM  . FLUoxetine  20 mg Oral Daily  . hydrALAZINE  50 mg Oral Q8H  . insulin aspart  0-9 Units Subcutaneous TID WC  . metoCLOPramide  10 mg Oral TID AC  . metoprolol tartrate  25 mg Oral BID  . pantoprazole  40 mg Oral q morning - 10a  . polyethylene glycol  17 g Oral Daily   . sodium chloride 50 mL/hr at 10/06/12 0710  acetaminophen, acetaminophen, morphine injection, ondansetron (ZOFRAN) IV, ondansetron, oxyCODONE, prochlorperazine, promethazine, sodium chloride, sodium chloride  Recent Labs  10/05/12 0515 10/06/12 0500  NA 141 144  K 3.5 4.0  CL 108 110  CO2 20 19  GLUCOSE 189* 195*  BUN 44* 39*  CREATININE 3.97* 3.44*  CALCIUM 8.5 8.8  PHOS 6.3* 5.8*      Recent Labs  10/05/12 0515 10/06/12 0500  ALBUMIN 1.6* 1.6*     Recent Labs  10/04/12 0602 10/05/12 0515  WBC 11.0* 11.5*  HGB 7.0* 7.1*  HCT 20.5* 20.9*  MCV 80.4 80.7  PLT 415* 435*     Assessment/Recommendations:  62 yo BM with DM, HTN, recent R BKA (7/3), CKD (baseline 1.68 on adm) admitted with gangrenous LLE,  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) with associated blood loss anemia, hypotension and recurrent AKI on CKD as a result  Acute on CKD  - hemodynamically mediated as above BL creatinine 1.68 on admission - peaked at 4.3 and is now slowly falling spontaneously (down to 3.4 today) Adequate UOP/non-oliguric but needs IVF due to almost no po intake  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 Gastroparesis On Reglan - increased to 10 mg 7/30 plus zofran and compazine Should GI see???  Malnutrition Does he need a feeding tube???  Dispo??  [name] B

## 2012-10-06 NOTE — Progress Notes (Signed)
Patient resting on recliner chair.Call bell within reach. Raider Valbuena Joselita,RN

## 2012-10-06 NOTE — Progress Notes (Signed)
Patient had a previous visitor before I came. Since he was closing his eyes, and mentioned he was tired. I had prayer With him, and told him I would keep him in my prayers. Passing on to unit chaplain. I

## 2012-10-06 NOTE — Progress Notes (Signed)
Assisted patient back to bed,2 assist. Olin Gurski Joselita,RN

## 2012-10-06 NOTE — Progress Notes (Signed)
Physical Therapy Treatment Patient Details Name: [name] MRN: [id number] DOB: [birth year] Today's Date: 10/06/2012 Time: [id number] PT Time Calculation (min): 17 min  PT Assessment / Plan / Recommendation  History of Present Illness     PT Comments   Pt initially not agreeable to therapy however RN in to assist with motivating pt to at least EOB.  Pt sat EOB and performed a couple exercises then requested transfer to recliner.  Pt doing better with lateral transfers.  Encouraged pt to use w/c next session and continue exercises on his own.   Follow Up Recommendations  SNF;Supervision/Assistance - 24 hour     Does the patient have the potential to tolerate intense rehabilitation     Barriers to Discharge        Equipment Recommendations  None recommended by PT    Recommendations for Other Services    Frequency     Progress towards PT Goals Progress towards PT goals: Progressing toward goals  Plan Current plan remains appropriate    Precautions / Restrictions Precautions Precautions: Fall Restrictions Other Position/Activity Restrictions: bil BKA   Pertinent Vitals/Pain Pt reports L LE phantom pain, RN notified and brought meds    Mobility  Bed Mobility Bed Mobility: Supine to Sit;Scooting to HOB Supine to Sit: 6: Modified independent (Device/Increase time) Scooting to [facility]: 6: Modified independent (Device/Increase time) Transfers Transfers: Lateral/Scoot Transfers Lateral/Scoot Transfers: With armrests removed;5: Supervision Details for Transfer Assistance: set up room for lateral/scoot transfer, pt able to perform transfer in less time than previously     Exercises Amputee Exercises Hip ABduction/ADduction: AROM;Seated;15 reps;Both Knee Flexion: AROM;Both;15 reps;Seated Knee Extension: AROM;Both;15 reps;Seated Straight Leg Raises: AROM;Both;15 reps;Supine   PT Diagnosis:    PT Problem List:   PT Treatment Interventions:     PT Goals (current goals can now be  found in the care plan section)    Visit Information  Last PT Received On: 10/06/12 Assistance Needed: +1    Subjective Data      Cognition  Cognition Arousal/Alertness: Awake/alert Behavior During Therapy: Flat affect Overall Cognitive Status: Within Functional Limits for tasks assessed    Balance     End of Session PT - End of Session Activity Tolerance: Patient tolerated treatment well Patient left: in chair;with call bell/phone within reach;with nursing/sitter in room Nurse Communication: Mobility status   GP     Tiyonna Sardinha,KATHrine E 10/06/2012, 4:13 PM [name], PT, DPT 10/06/2012 Pager: [phone]

## 2012-10-06 NOTE — Clinical Social Work Psych Assess (Signed)
Clinical Social Work Department CLINICAL SOCIAL WORK PSYCHIATRY SERVICE LINE ASSESSMENT 10/06/2012  Patient:  [name]  Account:  [id number]  Admit Date:  09/26/2012  Clinical Social Worker:  Wylene Men  Date/Time:  10/06/2012 12:57 PM Referred by:  Physician  Date referred:  10/06/2012 Reason for Referral  Behavioral Health Issues   Presenting Symptoms/Problems (In the person's/family's own words):   Consult placed was for major depression   Abuse/Neglect/Trauma History (check all that apply)  Denies history   Abuse/Neglect/Trauma Comments:   none reported or noted in chart   Psychiatric History (check all that apply)  Denies history   Psychiatric medications:  FLUoxetine (PROZAC) capsule 20 mg  Dose: 20 mg Freq: Daily Route: PO  Start: 10/04/12 1700   Current Mental Health Hospitalizations/Previous Mental Health History:   none reported or noted in the chart   Current provider:   none   Place and Date:   none   Current Medications:   Scheduled Meds:      . antiseptic oral rinse  15 mL Mouth Rinse BID  . aspirin  325 mg Oral BID  . darbepoetin (ARANESP) injection - NON-DIALYSIS  150 mcg Subcutaneous Q Mon-1800  . docusate sodium  100 mg Oral BID  . enoxaparin (LOVENOX) injection  30 mg Subcutaneous Q24H  . feeding supplement  1 Container Oral BID BM  . FLUoxetine  20 mg Oral Daily  . hydrALAZINE  50 mg Oral Q8H  . insulin aspart  0-9 Units Subcutaneous TID WC  . metoCLOPramide  10 mg Oral TID AC  . metoprolol tartrate  25 mg Oral BID  . pantoprazole  40 mg Oral q morning - 10a  . polyethylene glycol  17 g Oral Daily        Continuous Infusions:      . sodium chloride 50 mL/hr at 10/06/12 0710          PRN Meds:.acetaminophen, acetaminophen, morphine injection, ondansetron (ZOFRAN) IV, ondansetron, oxyCODONE, prochlorperazine, promethazine, sodium chloride, sodium chloride       Previous Impatient Admission/Date/Reason:   Emotional Health / Current  Symptoms    Suicide/Self Harm  None reported   Suicide attempt in the past:   none reported or noted in the chart  pt denies   Other harmful behavior:   none reported or noted in the chart  pt denies   Psychotic/Dissociative Symptoms  None reported   Other Psychotic/Dissociative Symptoms:   none reported or noted in the chart    Attention/Behavioral Symptoms  Withdrawn   Other Attention / Behavioral Symptoms:   none reported or noted in chart    Cognitive Impairment  Orientation - Place  Orientation - Situation  Orientation - Self   Other Cognitive Impairment:   none reported or noted in the chart    Mood and Adjustment  Mood Congruent  DEPRESSION    Stress, Anxiety, Trauma, Any Recent Loss/Stressor  Grief/Loss (recent or history)   Anxiety (frequency):   none reported or noted in chart   Phobia (specify):   none reported or noted in chart   Compulsive behavior (specify):   none reported or noted in chart   Obsessive behavior (specify):   none reported or noted in chart   Other:   Pt has recent loss - pt had 1st BKA the first of July and had to have a second BKA (opposite leg) this admission which was unexpected   Substance Abuse/Use  None   SBIRT completed (please refer  for detailed history):  N  Self-reported substance use:   n/a   Urinary Drug Screen Completed:  NA Alcohol level:   >11    Environmental/Housing/Living Arrangement  SKILLED NURSING FACILITY   Who is in the home:   Pt from [place]   Emergency contact:  Orson Gear, sister   Financial  Medicare   Patient's Strengths and Goals (patient's own words):   Pt is compliant with medical advice and has adequate support in place.   Clinical Social Worker's Interpretive Summary:   Psych CSW received consult for major depression.  Pt alert and oriented x4.  Pt states he was at [facility] prior to this admission for a previous BKA.  Upon this admission, pt underwent  a second BKA (opposite leg) which was unexpected.  Pt is having a hard time coping with his current medical conidition.  CSW provided support and encouragement to pt.  Pt denies SI/HI.  Pt denies AHVD.   Disposition:  Outpatient referral made/needed  Nonnie Done, [place] [phone]  Clinical Social Work

## 2012-10-07 ENCOUNTER — Inpatient Hospital Stay (HOSPITAL_COMMUNITY): Payer: Medicare Other

## 2012-10-07 DIAGNOSIS — F331 Major depressive disorder, recurrent, moderate: Secondary | ICD-10-CM

## 2012-10-07 LAB — RENAL FUNCTION PANEL
CO2: 18 mEq/L — ABNORMAL LOW (ref 19–32)
Calcium: 8.9 mg/dL (ref 8.4–10.5)
Creatinine, Ser: 3.12 mg/dL — ABNORMAL HIGH (ref 0.50–1.35)
Glucose, Bld: 182 mg/dL — ABNORMAL HIGH (ref 70–99)

## 2012-10-07 LAB — GLUCOSE, CAPILLARY
Glucose-Capillary: 164 mg/dL — ABNORMAL HIGH (ref 70–99)
Glucose-Capillary: 143 mg/dL — ABNORMAL HIGH (ref 70–99)
Glucose-Capillary: 175 mg/dL — ABNORMAL HIGH (ref 70–99)

## 2012-10-07 MED ORDER — POTASSIUM CHLORIDE CRYS ER 20 MEQ PO TBCR
30.0000 meq | EXTENDED_RELEASE_TABLET | Freq: Once | ORAL | Status: DC
  Filled 2012-10-07: qty 1

## 2012-10-07 MED ORDER — PANTOPRAZOLE SODIUM 40 MG IV SOLR
40.0000 mg | INTRAVENOUS | Status: DC
  Administered 2012-10-07 – 2012-10-19 (×12): 40 mg via INTRAVENOUS
  Filled 2012-10-07 (×15): qty 40

## 2012-10-07 MED ORDER — METOPROLOL TARTRATE 1 MG/ML IV SOLN
5.0000 mg | Freq: Four times a day (QID) | INTRAVENOUS | Status: DC
  Administered 2012-10-07 – 2012-10-20 (×51): 5 mg via INTRAVENOUS
  Filled 2012-10-07 (×56): qty 5

## 2012-10-07 MED ORDER — METOCLOPRAMIDE HCL 5 MG/ML IJ SOLN
10.0000 mg | Freq: Three times a day (TID) | INTRAMUSCULAR | Status: DC
  Administered 2012-10-07 – 2012-10-16 (×36): 10 mg via INTRAVENOUS
  Filled 2012-10-07 (×41): qty 2

## 2012-10-07 MED ORDER — WHITE PETROLATUM GEL
Status: AC
  Administered 2012-10-07: 15:00:00
  Filled 2012-10-07: qty 5

## 2012-10-07 MED ORDER — POTASSIUM CHLORIDE 10 MEQ/100ML IV SOLN
10.0000 meq | INTRAVENOUS | Status: AC
  Administered 2012-10-07 (×3): 10 meq via INTRAVENOUS
  Filled 2012-10-07 (×3): qty 100

## 2012-10-07 MED ORDER — HYDRALAZINE HCL 20 MG/ML IJ SOLN: 5.0000 mg | Freq: Four times a day (QID) | INTRAMUSCULAR | Status: DC | PRN

## 2012-10-07 MED ORDER — SODIUM BICARBONATE 650 MG PO TABS
1300.0000 mg | ORAL_TABLET | Freq: Three times a day (TID) | ORAL | Status: DC
  Filled 2012-10-07 (×18): qty 2

## 2012-10-07 NOTE — Progress Notes (Signed)
[facility] PROGRESS NOTE  Moe Yonamine [id number] DOB: 01-27-1951 DOA: 09/26/2012  PCP: Does not have a PCP  Brief HPI: Sheffield Migliore is a 62 y.o. 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 Patient endorsed some fevers, and chills. Patient also noted some warmth and slight erythema in the left lower extremity that has superficial lesions. He was found to have wet gangrene in his left leg. He underwent Left BKA on 09/27/12. He developed ARF in the interim and is being seen by Nephrology as well.  Assessment/Plan:  Fever  Fever has subsided. Was likely from leg lesions. Cultures have been negative. Diflucan was stopped on 7/30 after discussing with ID. WBC's stable  Nausea and Vomiting No abdominal pain or tenderness. 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 Will try IV Reglan for 1-2 days. Already on PPI. Patient does not want feeding tube. He will try to eat more. IV reglan may help as well. Hold off on Gi input for now.  Left lower extremity malodorous wounds/? Gangrenous lower extremity  He is not s/p left BKA as well. He has been cleared by Ortho. He will see them in follow up. Please review their recommendations listed on note on 10/03/12.   Acute on chronic kidney disease  Baseline creatinine 1.2-1.4. Renal ultrasound reportedly showing no hydronephrosis, BL echogenic kidneys consistent with medical renal disease. Nephrology following. Creatinine is improving. Low bicarb and Pot noted. Defer to nephrology.  Hypertension  Stable and relatively well controlled. Continue home regimen of hydralazine and metoprolol.   UTI  Positive for yeast. Per ID recommendations and given ongoing renal function problems no need for diflucan. He completed 3 days of  treatment and is currently asymptomatic.  Type 2 diabetes  Hemoglobin A1c was 5.9 on 09/06/2012. Continue SSI. Not on LA insulin presumably due to poor oral intake. CBG between 160-230. Can consider starting low dose levemir. Will see how his glucose levels are today.  Anemia  Likely anemia of chronic disease. Transfused 2 units of PRBC since admission. If hgb < 7.0 would transfuse more. Continue aranesp   Leukocytosis  Resolving currently. Recheck in AM. See above as well.  History of CVA  Continue aspirin 325 mg daily.   Depression Seen by Psyche. Prozac. Not suicidal.  Prophylaxis  PPI for GI prophylaxis. Lovenox for DVT prophylaxis.   Code Status: Full  Family Communication: Discussed with patient Disposition Plan: Will go to SNF when medically ready. Await improvement in renal function and nausea.  Consultants:  Ortho: Dr. Mayer Camel  Nephrology   Procedures:  Left BKA  Antibiotics:  Vancomycin and Zosyn d/c 7/25  Antifungal Fluconazole 10/01/12--10/04/12  HPI/Subjective: Had vomiting today of clear liquid. No blood. Denies any abdominal pain. Flat affect and depressed mood. No other complaints.  Objective: Filed Vitals:   10/06/12 1658 10/06/12 2043 10/06/12 2236 10/07/12 0554  BP: 138/62 157/67  160/70  Pulse: 85 81  80  Temp: 98.6 F (37 C) 99 F (37.2 C)  98.3 F (36.8 C)  TempSrc:  Oral  Oral  Resp: 18 18  18   Height:      Weight:   71.5 kg (157 lb 10.1 oz)   SpO2: 98% 99%  92%    Intake/Output Summary (Last 24 hours) at 10/07/12 0945 Last data filed at 10/07/12 0700  Gross per  24 hour  Intake   1380 ml  Output   2175 ml  Net   -795 ml   Filed Weights   10/04/12 2112 10/05/12 2122 10/06/12 2236  Weight: 73.3 kg (161 lb 9.6 oz) 73.5 kg (162 lb 0.6 oz) 71.5 kg (157 lb 10.1 oz)    Exam:   General:  Pt in NAD, Alert and awake; flat mood  Cardiovascular: S1S2 normal, regular. No rubs, murmurs or bruits.   Respiratory: CTA BL, no  wheezes  Abdomen: soft, NT, ND. BS present. No masses or organomegaly.  Musculoskeletal: Left and right BKA.    Data Reviewed: Basic Metabolic Panel:  Recent Labs Lab 10/03/12 0626 10/04/12 0602 10/05/12 0515 10/06/12 0500 10/07/12 0555  NA 135 138 141 144 146*  K 3.6 3.8 3.5 4.0 3.3*  CL 101 104 108 110 110  CO2 20 19 20 19  18*  GLUCOSE 128* 133* 189* 195* 182*  BUN 50* 48* 44* 39* 33*  CREATININE 4.31* 4.24* 3.97* 3.44* 3.12*  CALCIUM 8.2* 8.5 8.5 8.8 8.9  PHOS 5.5* 6.3* 6.3* 5.8* 5.2*   Liver Function Tests:  Recent Labs Lab 10/03/12 0626 10/04/12 0602 10/05/12 0515 10/06/12 0500 10/07/12 0555  ALBUMIN 1.4* 1.4* 1.6* 1.6* 1.6*   CBC:  Recent Labs Lab 10/01/12 0505 10/01/12 2238 10/03/12 0626 10/04/12 0602 10/05/12 0515  WBC 11.3* 11.3* 10.1 11.0* 11.5*  NEUTROABS  --  8.9*  --   --   --   HGB 7.8* 7.7* 7.1* 7.0* 7.1*  HCT 22.4* 23.0* 20.8* 20.5* 20.9*  MCV 78.6 79.6 79.4 80.4 80.7  PLT 355 331 393 415* 435*   CBG:  Recent Labs Lab 10/06/12 0822 10/06/12 1138 10/06/12 1656 10/06/12 2045 10/07/12 0729  GLUCAP 187* 167* 227* 170* 164*    Recent Results (from the past 240 hour(s))  SURGICAL PCR SCREEN     Status: None   Collection Time    09/27/12 11:50 AM      Result Value Range Status   MRSA, PCR NEGATIVE  NEGATIVE Final   Staphylococcus aureus NEGATIVE  NEGATIVE Final   Comment:            The Xpert SA Assay (FDA     approved for NASAL specimens     in patients over 55 years of age),     is one component of     a comprehensive surveillance     program.  Test performance has     been validated by [employer] for patients greater     than or equal to 30 year old.     It is not intended     to diagnose infection nor to     guide or monitor treatment.  URINE CULTURE     Status: None   Collection Time    09/28/12  6:41 AM      Result Value Range Status   Specimen Description URINE, RANDOM   Final   Special Requests NONE    Final   Culture  Setup Time 09/28/2012 10:37   Final   Colony Count >=100,000 COLONIES/ML   Final   Culture     Final   Value: Multiple bacterial morphotypes present, none predominant. Suggest appropriate recollection if clinically indicated.   Report Status 09/29/2012 FINAL   Final  URINE CULTURE     Status: None   Collection Time    09/29/12  2:17 PM      Result Value  Range Status   Specimen Description URINE, CLEAN CATCH   Final   Special Requests NONE   Final   Culture  Setup Time 09/29/2012 16:16   Final   Colony Count >=100,000 COLONIES/ML   Final   Culture YEAST   Final   Report Status 09/30/2012 FINAL   Final  CULTURE, BLOOD (ROUTINE X 2)     Status: None   Collection Time    10/01/12 10:30 PM      Result Value Range Status   Specimen Description BLOOD LEFT ARM   Final   Special Requests BOTTLES DRAWN AEROBIC AND ANAEROBIC 10CC EA   Final   Culture  Setup Time 10/02/2012 10:38   Final   Culture     Final   Value:        BLOOD CULTURE RECEIVED NO GROWTH TO DATE CULTURE WILL BE HELD FOR 5 DAYS BEFORE ISSUING A FINAL NEGATIVE REPORT   Report Status PENDING   Incomplete  CULTURE, BLOOD (ROUTINE X 2)     Status: None   Collection Time    10/01/12 10:35 PM      Result Value Range Status   Specimen Description BLOOD LEFT HAND   Final   Special Requests BOTTLES DRAWN AEROBIC ONLY 5CC   Final   Culture  Setup Time 10/02/2012 10:38   Final   Culture     Final   Value:        BLOOD CULTURE RECEIVED NO GROWTH TO DATE CULTURE WILL BE HELD FOR 5 DAYS BEFORE ISSUING A FINAL NEGATIVE REPORT   Report Status PENDING   Incomplete     Studies: No results found.  Scheduled Meds: . antiseptic oral rinse  15 mL Mouth Rinse BID  . aspirin  325 mg Oral BID  . darbepoetin (ARANESP) injection - NON-DIALYSIS  150 mcg Subcutaneous Q Mon-1800  . docusate sodium  100 mg Oral BID  . enoxaparin (LOVENOX) injection  30 mg Subcutaneous Q24H  . feeding supplement  1 Container Oral BID BM  .  FLUoxetine  20 mg Oral Daily  . hydrALAZINE  50 mg Oral Q8H  . insulin aspart  0-9 Units Subcutaneous TID WC  . metoCLOPramide (REGLAN) injection  10 mg Intravenous TID AC & HS  . metoprolol tartrate  25 mg Oral BID  . pantoprazole  40 mg Oral q morning - 10a  . polyethylene glycol  17 g Oral Daily   Continuous Infusions: . sodium chloride 50 mL/hr at 10/06/12 2343    Principal Problem:   Gangrene of foot Left Active Problems:   H/O: CVA (cerebrovascular accident)   Hypertension   Anemia   DM (diabetes mellitus) type 2, uncontrolled, with ketoacidosis   Nausea with vomiting   Fever   UTI (lower urinary tract infection)   Wound eschar of foot   Major depressive disorder, recurrent episode, moderate    Fadil Macmaster  [facility] Pager [phone]   If 7PM-7AM, please contact night-coverage at [email or web address], password [facility] 10/07/2012, 9:45 AM  LOS: 11 days

## 2012-10-07 NOTE — Progress Notes (Signed)
[facility] ROUNDING NOTE  S:  Remains depressed, nauseated, withdrawn Has tried to eat some crackers and suppls  O:BP 154/71  Pulse 87  Temp(Src) 98.9 F (37.2 C) (Oral)  Resp 18  Ht 5' 10.87" (1.8 m)  Wt 71.5 kg (157 lb 10.1 oz)  BMI 22.07 kg/m2  SpO2 96%  Intake/Output Summary (Last 24 hours) at 10/07/12 1327 Last data filed at 10/07/12 1002  Gross per 24 hour  Intake   1080 ml  Output   2150 ml  Net  -1070 ml   Weight change: -2 kg (-4 lb 6.6 oz) [id number] in bed.  Flat affect though maybe a tad brighter today   [id number] S1S2 No rub Resp:clear lungs bilat Abd:+ BS NTND Ext:bil BKA's with both stumps wrapped NEURO:[facility]NI OX3 no asterixis PICC line in right arm IVF running  Weights: 10/06/12 2236 71.5 kg  10/05/12 2122 73.5 kg  10/04/12 2112 73.3 kg  10/03/12 2218 74.254 kg 10/02/12 2116 71.532 kg  10/01/12 2056 74.2 kg   09/30/12 2133 76.7 kg   09/29/12 2114 75.4 kg 09/28/12 2057 86.6 kg  09/26/12 2115 86.6 kg   Medications: . antiseptic oral rinse  15 mL Mouth Rinse BID  . aspirin  325 mg Oral BID  . darbepoetin (ARANESP) injection - NON-DIALYSIS  150 mcg Subcutaneous Q Mon-1800  . docusate sodium  100 mg Oral BID  . enoxaparin (LOVENOX) injection  30 mg Subcutaneous Q24H  . feeding supplement  1 Container Oral BID BM  . FLUoxetine  20 mg Oral Daily  . hydrALAZINE  50 mg Oral Q8H  . insulin aspart  0-9 Units Subcutaneous TID WC  . metoCLOPramide (REGLAN) injection  10 mg Intravenous TID AC & HS  . metoprolol tartrate  25 mg Oral BID  . pantoprazole (PROTONIX) IV  40 mg Intravenous Q24H  . polyethylene glycol  17 g Oral Daily   . sodium chloride 50 mL/hr at 10/06/12 2343  acetaminophen, acetaminophen, hydrALAZINE, morphine injection, ondansetron (ZOFRAN) IV, ondansetron, oxyCODONE, prochlorperazine, promethazine, sodium chloride, sodium chloride  Recent Labs  10/06/12 0500 10/07/12 0555  NA 144 146*  K 4.0 3.3*  CL 110 110  CO2 19  18*  GLUCOSE 195* 182*  BUN 39* 33*  CREATININE 3.44* 3.12*  CALCIUM 8.8 8.9  PHOS 5.8* 5.2*     Recent Labs  10/06/12 0500 10/07/12 0555  ALBUMIN 1.6* 1.6*     Recent Labs  10/05/12 0515  WBC 11.5*  HGB 7.1*  HCT 20.9*  MCV 80.7  PLT 435*     Assessment/Recommendations:  62 yo BM with DM, HTN, recent R BKA (7/3), CKD (baseline 1.68 on adm) admitted with gangrenous LLE,  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) with associated blood loss anemia, hypotension and AKI on CKD as a result  Acute on CKD  - Hemodynamically mediated as above BL creatinine 1.68 on admission - peaked at 4.3 and is now slowly falling spontaneously (down to 3.1 today) Adequate UOP/non-oliguric but needs IVF due to almost no po intake Oral bicarb for mild acidosis KDur 30 po today for K 3.3  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 Gastroparesis On Reglan - increased  to 10 mg 7/30 plus zofran and compazine Should GI see???  Malnutrition Does he need a feeding tube???  Dispo??   [name] B

## 2012-10-07 NOTE — Progress Notes (Signed)
10/07/12 1312 nsg MD notified that  Pt. refused po med ;pt is vomiting everytime RN went to give his meds.

## 2012-10-07 NOTE — Progress Notes (Signed)
CSW reviewed chart and discussed with patient's nurse- patient is not medically stable for d/c at this time per MD.  Pt will return to [facility] when stable.  CSW will monitor.  Lorie Phenix. [place], [place]

## 2012-10-07 NOTE — Progress Notes (Signed)
10/07/12 1830 nsg Pt has an order for ultrasound of abdomen and can be done in room but pt refused to get back in bed  ; pt  is up on chair ;ultrasound called 2x and said they will do it in am.

## 2012-10-08 ENCOUNTER — Encounter (HOSPITAL_COMMUNITY)
Admission: EM | Disposition: A | Discharge status: Skilled Nursing Facility | Payer: Self-pay | Source: Home / Self Care | Attending: Family Medicine

## 2012-10-08 ENCOUNTER — Encounter (HOSPITAL_COMMUNITY): Payer: Self-pay | Admitting: Gastroenterology

## 2012-10-08 ENCOUNTER — Inpatient Hospital Stay (HOSPITAL_COMMUNITY): Payer: Medicare Other

## 2012-10-08 HISTORY — PX: ESOPHAGOGASTRODUODENOSCOPY: SHX5428

## 2012-10-08 LAB — CULTURE, BLOOD (ROUTINE X 2): Culture: NO GROWTH

## 2012-10-08 LAB — HEPATIC FUNCTION PANEL
ALT: 17 U/L (ref 0–53)
AST: 19 U/L (ref 0–37)
Albumin: 1.8 g/dL — ABNORMAL LOW (ref 3.5–5.2)
Total Protein: 7.8 g/dL (ref 6.0–8.3)

## 2012-10-08 LAB — BASIC METABOLIC PANEL
BUN: 30 mg/dL — ABNORMAL HIGH (ref 6–23)
CO2: 17 mEq/L — ABNORMAL LOW (ref 19–32)
Chloride: 111 mEq/L (ref 96–112)
Creatinine, Ser: 2.8 mg/dL — ABNORMAL HIGH (ref 0.50–1.35)

## 2012-10-08 LAB — CBC
Hemoglobin: 7.1 g/dL — ABNORMAL LOW (ref 13.0–17.0)
RBC: 2.57 MIL/uL — ABNORMAL LOW (ref 4.22–5.81)

## 2012-10-08 LAB — PHOSPHORUS: Phosphorus: 4.8 mg/dL — ABNORMAL HIGH (ref 2.3–4.6)

## 2012-10-08 SURGERY — EGD (ESOPHAGOGASTRODUODENOSCOPY)
Anesthesia: Moderate Sedation

## 2012-10-08 MED ORDER — MIDAZOLAM HCL 10 MG/2ML IJ SOLN
INTRAMUSCULAR | Status: DC | PRN
  Administered 2012-10-08 (×2): 2 mg via INTRAVENOUS

## 2012-10-08 MED ORDER — BUTAMBEN-TETRACAINE-BENZOCAINE 2-2-14 % EX AERO
INHALATION_SPRAY | CUTANEOUS | Status: DC | PRN
  Administered 2012-10-08: 2 via TOPICAL

## 2012-10-08 MED ORDER — SODIUM CHLORIDE 0.9 % IV SOLN: INTRAVENOUS | Status: DC

## 2012-10-08 MED ORDER — FENTANYL CITRATE 0.05 MG/ML IJ SOLN
INTRAMUSCULAR | Status: DC | PRN
  Administered 2012-10-08 (×2): 25 ug via INTRAVENOUS

## 2012-10-08 NOTE — Progress Notes (Signed)
[facility] ROUNDING NOTE  S:  Nausea appears to be biggest issue right now as nursing notes indicate repeated episodes of vomiting during the day and night yesterday Is on both IV regland and IV protonix UOP remains good Creatinine continues to slowly fall Requiring IVF d/t nearly total lack of po intake  O:BP 159/75  Pulse 95  Temp(Src) 98.1 F (36.7 C) (Oral)  Resp 18  Ht 5' 10.87" (1.8 m)  Wt 71.501 kg (157 lb 10.1 oz)  BMI 22.07 kg/m2  SpO2 98%  Intake/Output Summary (Last 24 hours) at 10/08/12 0754 Last data filed at 10/08/12 0700  Gross per 24 hour  Intake   2340 ml  Output   1975 ml  Net    365 ml   Weight change: 0.001 kg (0 oz) [id number] in bed.  Flat affect [id number] S1S2 No rub Resp:clear lungs bilat Abd:+ BS NTND Ext:bil BKA's with both stumps wrapped PICC line in right arm IVF running  Weights: 10/07/12  71.50 kg  10/06/12  71.5 kg  10/05/12  73.5 kg  10/04/12  73.3 kg  10/03/12  74.254 kg 10/02/12  71.532 kg  10/01/12  74.2 kg   09/30/12  76.7 kg   09/29/12  75.4 kg 09/28/12  86.6 kg  09/26/12  86.6 kg   Medications: . antiseptic oral rinse  15 mL Mouth Rinse BID  . aspirin  325 mg Oral BID  . darbepoetin (ARANESP) injection - NON-DIALYSIS  150 mcg Subcutaneous Q Mon-1800  . enoxaparin (LOVENOX) injection  30 mg Subcutaneous Q24H  . feeding supplement  1 Container Oral BID BM  . FLUoxetine  20 mg Oral Daily  . insulin aspart  0-9 Units Subcutaneous TID WC  . metoCLOPramide (REGLAN) injection  10 mg Intravenous TID AC & HS  . metoprolol  5 mg Intravenous Q6H  . pantoprazole (PROTONIX) IV  40 mg Intravenous Q24H  . polyethylene glycol  17 g Oral Daily  . sodium bicarbonate  1,300 mg Oral TID   . sodium chloride 50 mL/hr at 10/08/12 0700  acetaminophen, acetaminophen, hydrALAZINE, morphine injection, ondansetron (ZOFRAN) IV, ondansetron, oxyCODONE, prochlorperazine, promethazine, sodium chloride, sodium chloride   Recent  Labs  10/07/12 0555 10/08/12 0500  NA 146* 146*  K 3.3* 3.7  CL 110 111  CO2 18* 17*  GLUCOSE 182* 212*  BUN 33* 30*  CREATININE 3.12* 2.80*  CALCIUM 8.9 9.0  PHOS 5.2* 4.8*     Recent Labs  10/07/12 0555 10/08/12 0500  AST  --  19  ALT  --  17  ALKPHOS  --  214*  BILITOT  --  1.2  PROT  --  7.8  ALBUMIN 1.6* 1.8*    Recent Labs  10/08/12 0500  WBC 12.5*  HGB 7.1*  HCT 21.2*  MCV 82.5  PLT 491*    Assessment/Recommendations:  62 yo BM with DM, HTN, recent R BKA (7/3), CKD (baseline 1.68 on adm) admitted with gangrenous LLE,  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) with associated blood loss anemia, hypotension and AKI on CKD as a result  Acute on CKD  - Hemodynamically mediated  BL creatinine 1.68 on admission - peaked at 4.3 and is now slowly falling spontaneously (down to 2.8 today) Adequate UOP/non-oliguric but needs IVF due to negligible po intake Oral bicarb for acidosis Prn oral K suppl (had 30 po yesterday)  SP Lt BKA (7/23) and R BKA (7/2) Ortho following Wounds looked ok per ortho  (7/29)  DM  Insulin  per primary  HTN Currently on prn meds only  Anemia TSat 91/ferritin high Started darbepoetin 7/28 (150/week) Has been transfused 2 units since adm  May need additional blood if falls to <7  Nausea Seems like biggest issue impeding disposition On both iv reglan and protonix In my opinion needs further workup  Malnutrition Would benefit from feeding tube  At this time adding little to his care given slow but steady improvement in AKI on CKD He should remain on IVF's until he can consistently eat and drink Keep on oral bicarbonate Can replace potassium prn (good today after 30 yesterday) Please call if questions we can assist with Will sign off at this time.   Chalonda Schlatter B

## 2012-10-08 NOTE — Progress Notes (Signed)
[facility] PROGRESS NOTE  [name] [id number] DOB: [birth year] DOA: 09/26/2012  PCP: Does not have a PCP  Brief HPI: [name] is a 62 y.o. 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 Patient endorsed some fevers, and chills. Patient also noted some warmth and slight erythema in the left lower extremity that has superficial lesions. He was found to have wet gangrene in his left leg. He underwent Left BKA on 09/27/12. He developed ARF in the interim and is being seen by Nephrology as well.  Assessment/Plan:  Fever  Fever has subsided. Was likely from leg lesions. Cultures have been negative. Diflucan was stopped on 7/30 after discussing with ID. WBC's stable  Nausea and Vomiting Patient continues to have nausea and has had multiple episodes of emesis though Rn feels that he may not be truly vomiting. It remains unclear though. No abdominal pain or tenderness. This could be from diabetic gastroparesis. But he hasn't shown much improvement with oral or IV reglan. There definitely could be a psychosomatic element considering his depressed state but unable to completely rule out organic reason. US abdomen is pending. AAS done yesterday was unremarkable. PPI IV. US abdomen is pending. He has never had endoscopy. I think at this we have to consult GI. Have discussed with Dr. Oletta Lamas and he will try to do endoscopy this afternoon to rule out obstructive or other anatomical reasons for his symptoms. Patient does not want feeding tube.   Left lower extremity malodorous wounds/? Gangrenous lower extremity  He is now s/p left BKA as well. He has been cleared by Ortho. He will see them in follow up. Please review their recommendations listed on note on 10/03/12.    Acute on chronic kidney disease  Baseline creatinine 1.2-1.4. Renal ultrasound reportedly showing no hydronephrosis, BL echogenic kidneys consistent with medical renal disease. Nephrology following. Creatinine is improving slolwy. Low bicarb and Pot noted. Defer to nephrology.  Hypertension  Stable and relatively well controlled. Continue home regimen of hydralazine and metoprolol.   UTI  Positive for yeast. Per ID recommendations and given ongoing renal function problems no need for diflucan. He completed 3 days of treatment and is currently asymptomatic.  Type 2 diabetes  Hemoglobin A1c was 5.9 on 09/06/2012. Continue SSI. Not on LA insulin presumably due to poor oral intake. CBG between 160-230. Can consider starting low dose levemir once oral intake improves. CBG well controlled on SS alone.   Anemia  Likely anemia of chronic disease. Transfused 2 units of PRBC since admission. If hgb < 7.0 would transfuse more. Continue aranesp   Leukocytosis  Stable. See above as well.  History of CVA  Continue aspirin 325 mg daily.   Depression Seen by [facility]. Prozac. Not suicidal. But does have flat affect.   Prophylaxis  PPI for GI prophylaxis. Lovenox for DVT prophylaxis.   Code Status: Full  Family Communication: Discussed with patient Disposition Plan: Patient wants to go to his sister's place instead of SNF. I have explained that she may not be able to provide the care that he will initially considering recent bil amputation. He will think about it. Await improvement in renal function and nausea.  Consultants:  Ortho: Dr. Mayer Camel  Nephrology   [facility]  Procedures:  Left BKA  Antibiotics:  Vancomycin and Zosyn d/c  7/25  Antifungal Fluconazole 10/01/12--10/04/12  HPI/Subjective: Continues to throw up whatever he is drinking. Has not had any food. No blood in emesis. Denies any abdominal pain. Flat affect and depressed mood. No other complaints.  Objective: Filed  Vitals:   10/07/12 1328 10/07/12 1804 10/07/12 2034 10/08/12 0432  BP: 157/74 164/81 145/72 159/75  Pulse: 95 86 83 95  Temp: 99 F (37.2 C) 98.9 F (37.2 C) 98.5 F (36.9 C) 98.1 F (36.7 C)  TempSrc:  Oral Oral Oral  Resp: 18 18 18 18   Height:      Weight:   71.501 kg (157 lb 10.1 oz)   SpO2: 99% 100% 99% 98%    Intake/Output Summary (Last 24 hours) at 10/08/12 0759 Last data filed at 10/08/12 0700  Gross per 24 hour  Intake   2340 ml  Output   1975 ml  Net    365 ml   Filed Weights   10/05/12 2122 10/06/12 2236 10/07/12 2034  Weight: 73.5 kg (162 lb 0.6 oz) 71.5 kg (157 lb 10.1 oz) 71.501 kg (157 lb 10.1 oz)    Exam:   General:  Pt in NAD, Alert and awake; flat mood  Cardiovascular: S1S2 normal, regular. No rubs, murmurs or bruits.   Respiratory: CTA BL, no wheezes  Abdomen: soft, NT, ND. BS present. No masses or organomegaly.  Musculoskeletal: Left and right BKA.    Data Reviewed: Basic Metabolic Panel:  Recent Labs Lab 10/04/12 0602 10/05/12 0515 10/06/12 0500 10/07/12 0555 10/08/12 0500  NA 138 141 144 146* 146*  K 3.8 3.5 4.0 3.3* 3.7  CL 104 108 110 110 111  CO2 19 20 19  18* 17*  GLUCOSE 133* 189* 195* 182* 212*  BUN 48* 44* 39* 33* 30*  CREATININE 4.24* 3.97* 3.44* 3.12* 2.80*  CALCIUM 8.5 8.5 8.8 8.9 9.0  PHOS 6.3* 6.3* 5.8* 5.2* 4.8*   Liver Function Tests:  Recent Labs Lab 10/04/12 0602 10/05/12 0515 10/06/12 0500 10/07/12 0555 10/08/12 0500  AST  --   --   --   --  19  ALT  --   --   --   --  17  ALKPHOS  --   --   --   --  214*  BILITOT  --   --   --   --  1.2  PROT  --   --   --   --  7.8  ALBUMIN 1.4* 1.6* 1.6* 1.6* 1.8*   CBC:  Recent Labs Lab 10/01/12 2238 10/03/12 0626 10/04/12 0602 10/05/12 0515 10/08/12 0500  WBC 11.3* 10.1 11.0* 11.5* 12.5*  NEUTROABS 8.9*  --   --   --   --   HGB 7.7* 7.1* 7.0* 7.1* 7.1*  HCT 23.0* 20.8* 20.5* 20.9* 21.2*  MCV 79.6 79.4 80.4 80.7 82.5  PLT 331 393 415* 435* 491*    CBG:  Recent Labs Lab 10/06/12 2045 10/07/12 0729 10/07/12 1146 10/07/12 1715 10/07/12 2033  GLUCAP 170* 164* 143* 175* 162*    Recent Results (from the past 240 hour(s))  URINE CULTURE     Status: None   Collection Time    09/29/12  2:17 PM      Result Value Range Status   Specimen Description URINE, CLEAN CATCH   Final   Special Requests NONE   Final   Culture  Setup Time 09/29/2012 16:16   Final   Colony Count >=100,000 COLONIES/ML   Final   Culture YEAST  Final   Report Status 09/30/2012 FINAL   Final  CULTURE, BLOOD (ROUTINE X 2)     Status: None   Collection Time    10/01/12 10:30 PM      Result Value Range Status   Specimen Description BLOOD LEFT ARM   Final   Special Requests BOTTLES DRAWN AEROBIC AND ANAEROBIC 10CC EA   Final   Culture  Setup Time 10/02/2012 10:38   Final   Culture     Final   Value:        BLOOD CULTURE RECEIVED NO GROWTH TO DATE CULTURE WILL BE HELD FOR 5 DAYS BEFORE ISSUING A FINAL NEGATIVE REPORT   Report Status PENDING   Incomplete  CULTURE, BLOOD (ROUTINE X 2)     Status: None   Collection Time    10/01/12 10:35 PM      Result Value Range Status   Specimen Description BLOOD LEFT HAND   Final   Special Requests BOTTLES DRAWN AEROBIC ONLY 5CC   Final   Culture  Setup Time 10/02/2012 10:38   Final   Culture     Final   Value:        BLOOD CULTURE RECEIVED NO GROWTH TO DATE CULTURE WILL BE HELD FOR 5 DAYS BEFORE ISSUING A FINAL NEGATIVE REPORT   Report Status PENDING   Incomplete     Studies: Dg Abd Acute W/chest  10/07/2012   *RADIOLOGY REPORT*  Clinical Data: Nausea, vomiting.  Epigastric pain.  ACUTE ABDOMEN SERIES (ABDOMEN 2 VIEW & CHEST 1 VIEW)  Comparison: 10/01/2012  Findings: Prior CABG.  Right PICC line is unchanged.  Cardiomegaly. No confluent opacities or effusions.  Nonobstructive bowel gas pattern.  No free air.  No organomegaly or suspicious calcification.  No acute bony abnormality. Old, possibly healing left sixth and  seventh rib fractures, unchanged.  IMPRESSION: Cardiomegaly.  Prior CABG.  No acute cardiopulmonary disease.  No evidence of bowel obstruction or free air.   Original Report Authenticated By: Rolm Baptise, M.D.    Scheduled Meds: . antiseptic oral rinse  15 mL Mouth Rinse BID  . aspirin  325 mg Oral BID  . darbepoetin (ARANESP) injection - NON-DIALYSIS  150 mcg Subcutaneous Q Mon-1800  . enoxaparin (LOVENOX) injection  30 mg Subcutaneous Q24H  . feeding supplement  1 Container Oral BID BM  . FLUoxetine  20 mg Oral Daily  . insulin aspart  0-9 Units Subcutaneous TID WC  . metoCLOPramide (REGLAN) injection  10 mg Intravenous TID AC & HS  . metoprolol  5 mg Intravenous Q6H  . pantoprazole (PROTONIX) IV  40 mg Intravenous Q24H  . polyethylene glycol  17 g Oral Daily  . sodium bicarbonate  1,300 mg Oral TID   Continuous Infusions: . sodium chloride 50 mL/hr at 10/08/12 0700    Principal Problem:   Gangrene of foot Left Active Problems:   H/O: CVA (cerebrovascular accident)   Hypertension   Anemia   DM (diabetes mellitus) type 2, uncontrolled, with ketoacidosis   Nausea with vomiting   Fever   UTI (lower urinary tract infection)   Wound eschar of foot   Major depressive disorder, recurrent episode, moderate    [name]  [facility] Pager [phone]   If 7PM-7AM, please contact night-coverage at [email or web address], password [email or web address] 10/08/2012, 7:59 AM  LOS: 12 days

## 2012-10-08 NOTE — Progress Notes (Signed)
At 2000 last night pt drank one cup of water, and immediately vomit or spit the  clear water back up with clear sputum,and ask for nausea medicine,pt slept till 0430 and ask for a cup of water and immediately vomit or spit clear water with clear sputum out in the pink bucket.and ask for nausea medicine.At [phone] pt drank ginger ale in a cup and immediately vomit or spit out light yellow liquid with clear sputum and ask for something for nausea,but was not time for nausea med,pt went back to sleep will cont to monitor.Rockie Neighbours RN

## 2012-10-08 NOTE — Progress Notes (Signed)
10/08/12 1649 nsg Pt has no more episodes of vomiting since he was  placed on NPO.

## 2012-10-08 NOTE — Interval H&P Note (Signed)
History and Physical Interval Note:  10/08/2012 11:43 AM  [name]  has presented today for surgery, with the diagnosis of N+V  The various methods of treatment have been discussed with the patient and family. After consideration of risks, benefits and other options for treatment, the patient has consented to  Procedure(s): ESOPHAGOGASTRODUODENOSCOPY (EGD) (N/A) as a surgical intervention .  The patient's history has been reviewed, patient examined, no change in status, stable for surgery.  I have reviewed the patient's chart and labs.  Questions were answered to the patient's satisfaction.     Nanami Whitelaw JR,Tyneisha Hegeman L

## 2012-10-08 NOTE — Progress Notes (Signed)
[facility] Reason for consult:N+V Referring Physician: [facility]. PCP: none  [name] is an 62 y.o. male.  HPI: he is quite unfortunate. He has had a history of type I diabetes with multiple complications. Within the past several months he has undergone bilateral annotations. He was admitted to the hospital about 10 to 12 days ago with fever and chills, nausea and vomiting. He was tuned up and underwent left lower extremity amputation on this admission. He has stage III CKD with exacerbation that apparently is better now with fluids. He was seen by the renal service. He has had problems with anemia, chronic nausea and vomiting that apparently has been empirically treated with metoclopramide in the past. The patient is unable to tell me who has been writing his prescriptions are providing his health care. He states that he has been vomiting now for 3 months. He denies abdominal pain but vomits ascendancy is a minimal wreck and vomit clear material sometimes for the entire day. He states that he has never had a gallbladder evaluation. He denies prior EGD her colonoscopy. His last abdominal imaging was an MRA several weeks ago to evaluate his renal arteries which did not show any other intra-abdominal pathology  Past Medical History  Diagnosis Date  . Hypertension   . 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 Myocardial infarction   . Hx of seasonal allergies   . GERD (gastroesophageal reflux disease)   . High cholesterol   . Exertional shortness of breath     "before my last OR; I'm fine now" (09/26/2012)  . Type II diabetes mellitus     Past Surgical History  Procedure Laterality Date  . 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 Amputation Right 07/29/2012    Procedure: AMPUTATION 3RD TOE;  Surgeon: Kerin Salen, MD;  Location: [place];  Service: Orthopedics;  Laterality: Right;  right third toe amputation  . Amputation Right 08/02/2012    Procedure: right  transmetatarsal amputation;  Surgeon: Kerin Salen, MD;  Location: [place];  Service: Orthopedics;  Laterality: Right;  . Cataract extraction w/ intraocular lens  implant, bilateral  ?2010  . Amputation Right 09/06/2012    Procedure: AMPUTATION BELOW KNEE;  Surgeon: Kerin Salen, MD;  Location: [place];  Service: Orthopedics;  Laterality: Right;  . Peripherally inserted central catheter insertion Right 09/2012    upper arm  . Amputation Left 09/27/2012    Procedure: AMPUTATION BELOW KNEE;  Surgeon: Kerin Salen, MD;  Location: [place];  Service: Orthopedics;  Laterality: Left;    History reviewed. No pertinent family history.  Social History:  reports that he has quit smoking. His smoking use included Cigarettes. He has a 2.4 pack-year smoking history. He has never used smokeless tobacco. He reports that he does not drink alcohol or use illicit drugs.  Allergies: No Known Allergies  Medications; . antiseptic oral rinse  15 mL Mouth Rinse BID  . aspirin  325 mg Oral BID  . darbepoetin (ARANESP) injection - NON-DIALYSIS  150 mcg Subcutaneous Q Mon-1800  . enoxaparin (LOVENOX) injection  30 mg Subcutaneous Q24H  . feeding supplement  1 Container Oral BID BM  . FLUoxetine  20 mg Oral Daily  . insulin aspart  0-9 Units Subcutaneous TID WC  . metoCLOPramide (REGLAN) injection  10 mg Intravenous TID AC & HS  . metoprolol  5 mg Intravenous Q6H  . pantoprazole (PROTONIX) IV  40 mg  Intravenous Q24H  . polyethylene glycol  17 g Oral Daily  . sodium bicarbonate  1,300 mg Oral TID   PRN Meds acetaminophen, acetaminophen, hydrALAZINE, morphine injection, ondansetron (ZOFRAN) IV, ondansetron, oxyCODONE, prochlorperazine, promethazine, sodium chloride, sodium chloride Results for orders placed during the hospital encounter of 09/26/12 (from the past 48 hour(s))  GLUCOSE, CAPILLARY     Status: Abnormal   Collection Time    10/06/12 11:38 AM      Result Value Range   Glucose-Capillary 167 (*) 70 - 99  mg/dL  GLUCOSE, CAPILLARY     Status: Abnormal   Collection Time    10/06/12  4:56 PM      Result Value Range   Glucose-Capillary 227 (*) 70 - 99 mg/dL  GLUCOSE, CAPILLARY     Status: Abnormal   Collection Time    10/06/12  8:45 PM      Result Value Range   Glucose-Capillary 170 (*) 70 - 99 mg/dL  RENAL FUNCTION PANEL     Status: Abnormal   Collection Time    10/07/12  5:55 AM      Result Value Range   Sodium 146 (*) 135 - 145 mEq/L   Potassium 3.3 (*) 3.5 - 5.1 mEq/L   Chloride 110  96 - 112 mEq/L   CO2 18 (*) 19 - 32 mEq/L   Glucose, Bld 182 (*) 70 - 99 mg/dL   BUN 33 (*) 6 - 23 mg/dL   Creatinine, Ser 3.12 (*) 0.50 - 1.35 mg/dL   Calcium 8.9  8.4 - 10.5 mg/dL   Phosphorus 5.2 (*) 2.3 - 4.6 mg/dL   Albumin 1.6 (*) 3.5 - 5.2 g/dL   GFR calc non Af Amer 20 (*) >90 mL/min   GFR calc Af Amer 23 (*) >90 mL/min   Comment:            The eGFR has been calculated     using the CKD EPI equation.     This calculation has not been     validated in all clinical     situations.     eGFR's persistently     <90 mL/min signify     possible Chronic Kidney Disease.  GLUCOSE, CAPILLARY     Status: Abnormal   Collection Time    10/07/12  7:29 AM      Result Value Range   Glucose-Capillary 164 (*) 70 - 99 mg/dL  GLUCOSE, CAPILLARY     Status: Abnormal   Collection Time    10/07/12 11:46 AM      Result Value Range   Glucose-Capillary 143 (*) 70 - 99 mg/dL  GLUCOSE, CAPILLARY     Status: Abnormal   Collection Time    10/07/12  5:15 PM      Result Value Range   Glucose-Capillary 175 (*) 70 - 99 mg/dL  GLUCOSE, CAPILLARY     Status: Abnormal   Collection Time    10/07/12  8:33 PM      Result Value Range   Glucose-Capillary 162 (*) 70 - 99 mg/dL  CBC     Status: Abnormal   Collection Time    10/08/12  5:00 AM      Result Value Range   WBC 12.5 (*) 4.0 - 10.5 K/uL   RBC 2.57 (*) 4.22 - 5.81 MIL/uL   Hemoglobin 7.1 (*) 13.0 - 17.0 g/dL   HCT 21.2 (*) 39.0 - 52.0 %   MCV 82.5   78.0 - 100.0 fL  MCH 27.6  26.0 - 34.0 pg   MCHC 33.5  30.0 - 36.0 g/dL   RDW 19.3 (*) 11.5 - 15.5 %   Platelets 491 (*) 150 - 400 K/uL  HEPATIC FUNCTION PANEL     Status: Abnormal   Collection Time    10/08/12  5:00 AM      Result Value Range   Total Protein 7.8  6.0 - 8.3 g/dL   Albumin 1.8 (*) 3.5 - 5.2 g/dL   AST 19  0 - 37 U/L   ALT 17  0 - 53 U/L   Alkaline Phosphatase 214 (*) 39 - 117 U/L   Total Bilirubin 1.2  0.3 - 1.2 mg/dL   Bilirubin, Direct 0.7 (*) 0.0 - 0.3 mg/dL   Indirect Bilirubin 0.5  0.3 - 0.9 mg/dL  BASIC METABOLIC PANEL     Status: Abnormal   Collection Time    10/08/12  5:00 AM      Result Value Range   Sodium 146 (*) 135 - 145 mEq/L   Potassium 3.7  3.5 - 5.1 mEq/L   Chloride 111  96 - 112 mEq/L   CO2 17 (*) 19 - 32 mEq/L   Glucose, Bld 212 (*) 70 - 99 mg/dL   BUN 30 (*) 6 - 23 mg/dL   Creatinine, Ser 2.80 (*) 0.50 - 1.35 mg/dL   Calcium 9.0  8.4 - 10.5 mg/dL   GFR calc non Af Amer 23 (*) >90 mL/min   GFR calc Af Amer 26 (*) >90 mL/min   Comment:            The eGFR has been calculated     using the CKD EPI equation.     This calculation has not been     validated in all clinical     situations.     eGFR's persistently     <90 mL/min signify     possible Chronic Kidney Disease.  PHOSPHORUS     Status: Abnormal   Collection Time    10/08/12  5:00 AM      Result Value Range   Phosphorus 4.8 (*) 2.3 - 4.6 mg/dL  GLUCOSE, CAPILLARY     Status: Abnormal   Collection Time    10/08/12  7:59 AM      Result Value Range   Glucose-Capillary 229 (*) 70 - 99 mg/dL    Dg Abd Acute W/chest  10/07/2012   *RADIOLOGY REPORT*  Clinical Data: Nausea, vomiting.  Epigastric pain.  ACUTE ABDOMEN SERIES (ABDOMEN 2 VIEW & CHEST 1 VIEW)  Comparison: 10/01/2012  Findings: Prior CABG.  Right PICC line is unchanged.  Cardiomegaly. No confluent opacities or effusions.  Nonobstructive bowel gas pattern.  No free air.  No organomegaly or suspicious calcification.  No acute  bony abnormality. Old, possibly healing left sixth and seventh rib fractures, unchanged.  IMPRESSION: Cardiomegaly.  Prior CABG.  No acute cardiopulmonary disease.  No evidence of bowel obstruction or free air.   Original Report Authenticated By: Rolm Baptise, M.D.               Blood pressure 159/75, pulse 95, temperature 98.1 F (36.7 C), temperature source Oral, resp. rate 18, height 5' 10.87" (1.8 m), weight 71.501 kg (157 lb 10.1 oz), SpO2 98.00%.  Physical exam:   General-- frail appearing African-American male vomiting into a basin  Heart-- regular rate and rhythm without murmurs are gallops  Lungs-- clear  Abdomen-- none distended and soft completely nontender  Assessment: 1 Nausea and Vomiting.. Likely may be due to gastroparesis given his severe diabetes. This possibly could have an ulcer although he has never had ulcer disease before it has not been on a NSAIDs. Biliary disease is also possible 2. Type I diabetes. Multiple complications including bilateral lower extremity applications, stage III CKD, history of strokes and CAD. 3. Status post bilateral lower extremity amputations.  Plan: we will go ahead with EGD this morning. This is negative he will likely need ultrasound of the gallbladder as well as gastric emptying scan. I have discussed this with him and he is agreeable to proceed at this time.   Amol Domanski JR,Iszabella Hebenstreit L 10/08/2012, 8:37 AM

## 2012-10-08 NOTE — H&P (View-Only) (Signed)
[facility] CONS[facility] Reason for consult:N+V Referring Physician: [facility]. PCP: none  [name] is an 62 y.o. male.  HPI: he is quite unfortunate. He has had a history of type I diabetes with multiple complications. Within the past several months he has undergone bilateral annotations. He was admitted to the hospital about 10 to 12 days ago with fever and chills, nausea and vomiting. He was tuned up and underwent left lower extremity amputation on this admission. He has stage III CKD with exacerbation that apparently is better now with fluids. He was seen by the renal service. He has had problems with anemia, chronic nausea and vomiting that apparently has been empirically treated with metoclopramide in the past. The patient is unable to tell me who has been writing his prescriptions are providing his health care. He states that he has been vomiting now for 3 months. He denies abdominal pain but vomits ascendancy is a minimal wreck and vomit clear material sometimes for the entire day. He states that he has never had a gallbladder evaluation. He denies prior EGD her colonoscopy. His last abdominal imaging was an MRA several weeks ago to evaluate his renal arteries which did not show any other intra-abdominal pathology  Past Medical History  Diagnosis Date  . Hypertension   . 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 Myocardial infarction   . Hx of seasonal allergies   . GERD (gastroesophageal reflux disease)   . High cholesterol   . Exertional shortness of breath     "before my last OR; I'm fine now" (09/26/2012)  . Type II diabetes mellitus     Past Surgical History  Procedure Laterality Date  . 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 Amputation Right 07/29/2012    Procedure: AMPUTATION 3RD TOE;  Surgeon: Kerin Salen, MD;  Location: [place];  Service: Orthopedics;  Laterality: Right;  right third toe amputation  . Amputation Right 08/02/2012    Procedure: right  transmetatarsal amputation;  Surgeon: Kerin Salen, MD;  Location: [place];  Service: Orthopedics;  Laterality: Right;  . Cataract extraction w/ intraocular lens  implant, bilateral  ?2010  . Amputation Right 09/06/2012    Procedure: AMPUTATION BELOW KNEE;  Surgeon: Kerin Salen, MD;  Location: [place];  Service: Orthopedics;  Laterality: Right;  . Peripherally inserted central catheter insertion Right 09/2012    upper arm  . Amputation Left 09/27/2012    Procedure: AMPUTATION BELOW KNEE;  Surgeon: Kerin Salen, MD;  Location: [place];  Service: Orthopedics;  Laterality: Left;    History reviewed. No pertinent family history.  Social History:  reports that he has quit smoking. His smoking use included Cigarettes. He has a 2.4 pack-year smoking history. He has never used smokeless tobacco. He reports that he does not drink alcohol or use illicit drugs.  Allergies: No Known Allergies  Medications; . antiseptic oral rinse  15 mL Mouth Rinse BID  . aspirin  325 mg Oral BID  . darbepoetin (ARANESP) injection - NON-DIALYSIS  150 mcg Subcutaneous Q Mon-1800  . enoxaparin (LOVENOX) injection  30 mg Subcutaneous Q24H  . feeding supplement  1 Container Oral BID BM  . FLUoxetine  20 mg Oral Daily  . insulin aspart  0-9 Units Subcutaneous TID WC  . metoCLOPramide (REGLAN) injection  10 mg Intravenous TID AC & HS  . metoprolol  5 mg Intravenous Q6H  . pantoprazole (PROTONIX) IV  40 mg  Intravenous Q24H  . polyethylene glycol  17 g Oral Daily  . sodium bicarbonate  1,300 mg Oral TID   PRN Meds acetaminophen, acetaminophen, hydrALAZINE, morphine injection, ondansetron (ZOFRAN) IV, ondansetron, oxyCODONE, prochlorperazine, promethazine, sodium chloride, sodium chloride Results for orders placed during the hospital encounter of 09/26/12 (from the past 48 hour(s))  GLUCOSE, CAPILLARY     Status: Abnormal   Collection Time    10/06/12 11:38 AM      Result Value Range   Glucose-Capillary 167 (*) 70 - 99  mg/dL  GLUCOSE, CAPILLARY     Status: Abnormal   Collection Time    10/06/12  4:56 PM      Result Value Range   Glucose-Capillary 227 (*) 70 - 99 mg/dL  GLUCOSE, CAPILLARY     Status: Abnormal   Collection Time    10/06/12  8:45 PM      Result Value Range   Glucose-Capillary 170 (*) 70 - 99 mg/dL  RENAL FUNCTION PANEL     Status: Abnormal   Collection Time    10/07/12  5:55 AM      Result Value Range   Sodium 146 (*) 135 - 145 mEq/Price   Potassium 3.3 (*) 3.5 - 5.1 mEq/Price   Chloride 110  96 - 112 mEq/Price   CO2 18 (*) 19 - 32 mEq/Price   Glucose, Bld 182 (*) 70 - 99 mg/dL   BUN 33 (*) 6 - 23 mg/dL   Creatinine, Ser 3.12 (*) 0.50 - 1.35 mg/dL   Calcium 8.9  8.4 - 10.5 mg/dL   Phosphorus 5.2 (*) 2.3 - 4.6 mg/dL   Albumin 1.6 (*) 3.5 - 5.2 g/dL   GFR calc non Af Amer 20 (*) >90 mL/min   GFR calc Af Amer 23 (*) >90 mL/min   Comment:            The eGFR has been calculated     using the CKD EPI equation.     This calculation has not been     validated in all clinical     situations.     eGFR's persistently     <90 mL/min signify     possible Chronic Kidney Disease.  GLUCOSE, CAPILLARY     Status: Abnormal   Collection Time    10/07/12  7:29 AM      Result Value Range   Glucose-Capillary 164 (*) 70 - 99 mg/dL  GLUCOSE, CAPILLARY     Status: Abnormal   Collection Time    10/07/12 11:46 AM      Result Value Range   Glucose-Capillary 143 (*) 70 - 99 mg/dL  GLUCOSE, CAPILLARY     Status: Abnormal   Collection Time    10/07/12  5:15 PM      Result Value Range   Glucose-Capillary 175 (*) 70 - 99 mg/dL  GLUCOSE, CAPILLARY     Status: Abnormal   Collection Time    10/07/12  8:33 PM      Result Value Range   Glucose-Capillary 162 (*) 70 - 99 mg/dL  CBC     Status: Abnormal   Collection Time    10/08/12  5:00 AM      Result Value Range   WBC 12.5 (*) 4.0 - 10.5 K/uL   RBC 2.57 (*) 4.22 - 5.81 MIL/uL   Hemoglobin 7.1 (*) 13.0 - 17.0 g/dL   HCT 21.2 (*) 39.0 - 52.0 %   MCV 82.5   78.0 - 100.0 fL  MCH 27.6  26.0 - 34.0 pg   MCHC 33.5  30.0 - 36.0 g/dL   RDW 19.3 (*) 11.5 - 15.5 %   Platelets 491 (*) 150 - 400 K/uL  HEPATIC FUNCTION PANEL     Status: Abnormal   Collection Time    10/08/12  5:00 AM      Result Value Range   Total Protein 7.8  6.0 - 8.3 g/dL   Albumin 1.8 (*) 3.5 - 5.2 g/dL   AST 19  0 - 37 U/Price   ALT 17  0 - 53 U/Price   Alkaline Phosphatase 214 (*) 39 - 117 U/Price   Total Bilirubin 1.2  0.3 - 1.2 mg/dL   Bilirubin, Direct 0.7 (*) 0.0 - 0.3 mg/dL   Indirect Bilirubin 0.5  0.3 - 0.9 mg/dL  BASIC METABOLIC PANEL     Status: Abnormal   Collection Time    10/08/12  5:00 AM      Result Value Range   Sodium 146 (*) 135 - 145 mEq/Price   Potassium 3.7  3.5 - 5.1 mEq/Price   Chloride 111  96 - 112 mEq/Price   CO2 17 (*) 19 - 32 mEq/Price   Glucose, Bld 212 (*) 70 - 99 mg/dL   BUN 30 (*) 6 - 23 mg/dL   Creatinine, Ser 2.80 (*) 0.50 - 1.35 mg/dL   Calcium 9.0  8.4 - 10.5 mg/dL   GFR calc non Af Amer 23 (*) >90 mL/min   GFR calc Af Amer 26 (*) >90 mL/min   Comment:            The eGFR has been calculated     using the CKD EPI equation.     This calculation has not been     validated in all clinical     situations.     eGFR's persistently     <90 mL/min signify     possible Chronic Kidney Disease.  PHOSPHORUS     Status: Abnormal   Collection Time    10/08/12  5:00 AM      Result Value Range   Phosphorus 4.8 (*) 2.3 - 4.6 mg/dL  GLUCOSE, CAPILLARY     Status: Abnormal   Collection Time    10/08/12  7:59 AM      Result Value Range   Glucose-Capillary 229 (*) 70 - 99 mg/dL    Dg Abd Acute W/chest  10/07/2012   *RADIOLOGY REPORT*  Clinical Data: Nausea, vomiting.  Epigastric pain.  ACUTE ABDOMEN SERIES (ABDOMEN 2 VIEW & CHEST 1 VIEW)  Comparison: 10/01/2012  Findings: Prior CABG.  Right PICC line is unchanged.  Cardiomegaly. No confluent opacities or effusions.  Nonobstructive bowel gas pattern.  No free air.  No organomegaly or suspicious calcification.  No acute  bony abnormality. Old, possibly healing left sixth and seventh rib fractures, unchanged.  IMPRESSION: Cardiomegaly.  Prior CABG.  No acute cardiopulmonary disease.  No evidence of bowel obstruction or free air.   Original Report Authenticated By: Rolm Baptise, M.D.               Blood pressure 159/75, pulse 95, temperature 98.1 F (36.7 C), temperature source Oral, resp. rate 18, height 5' 10.87" (1.8 m), weight 71.501 kg (157 lb 10.1 oz), SpO2 98.00%.  Physical exam:   General-- frail appearing African-American male vomiting into a basin  Heart-- regular rate and rhythm without murmurs are gallops  Lungs-- clear  Abdomen-- none distended and soft completely nontender  Assessment: 1 Nausea and Vomiting.. Likely may be due to gastroparesis given his severe diabetes. This possibly could have an ulcer although he has never had ulcer disease before it has not been on a NSAIDs. Biliary disease is also possible 2. Type I diabetes. Multiple complications including bilateral lower extremity applications, stage III CKD, history of strokes and CAD. 3. Status post bilateral lower extremity amputations.  Plan: we will go ahead with EGD this morning. This is negative he will likely need ultrasound of the gallbladder as well as gastric emptying scan. I have discussed this with him and he is agreeable to proceed at this time.   [name],[name] 10/08/2012, 8:37 AM

## 2012-10-08 NOTE — Op Note (Signed)
Procedure: Esophagogastroduodenoscopy Physician: Laurence Spates M.D. Date of the Procedure: 10/08/12 Medications: fentanyl 50 mcg, versed 4 mg IV cetacaine spray  Description procedure: The Procedure had been explained to the patient and consider obtained. In the left lateral decubitus position the Pentax adult endoscope was passed into the esophagus with swallowing. The esophagus was carefully examine and was completely normal with no esophagitis, varices, Barrett's esophagus etc. The GE junction was normal. The patient had a large stomach that was carefully examined in the forward and retroflex view. No ulceration or inflammation was found. The pyloric channel was widely patent. The scope is passed into the duodenum and the duodenal bulb and 2nd duodenum were normal. The patient tolerated procedure well.   Assessment: 1. Nausea and vomiting. There is no explanation for the symptoms on EGD. 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 Would continue symptomatic treatment with metoclopramide and IV fluids 2. Will order gastric emptying scan.

## 2012-10-08 NOTE — Progress Notes (Signed)
10/08/12 1415 [facility] called  And said that they do not do gastric emptying on weekends and will be done in am. Pt may eat but npo post midnight for the study. MD notified.

## 2012-10-09 ENCOUNTER — Inpatient Hospital Stay (HOSPITAL_COMMUNITY): Payer: Medicare Other

## 2012-10-09 ENCOUNTER — Encounter (HOSPITAL_COMMUNITY): Payer: Self-pay | Admitting: Gastroenterology

## 2012-10-09 LAB — GLUCOSE, CAPILLARY
Glucose-Capillary: 210 mg/dL — ABNORMAL HIGH (ref 70–99)
Glucose-Capillary: 182 mg/dL — ABNORMAL HIGH (ref 70–99)
Glucose-Capillary: 209 mg/dL — ABNORMAL HIGH (ref 70–99)

## 2012-10-09 NOTE — Progress Notes (Signed)
Inpatient Diabetes Program Recommendations  AACE/ADA: New Consensus Statement on Inpatient Glycemic Control (2013)  Target Ranges:  Prepandial:   less than 140 mg/dL      Peak postprandial:   less than 180 mg/dL (1-2 hours)      Critically ill patients:  140 - 180 mg/dL   Reason for Assessmentt: CBG's before breakfast sub-optimal yesterday and today  Results for [name], [name] (MRN [id number]) as of 10/09/2012 14:23  Ref. Range 10/08/2012 07:59 10/08/2012 12:38 10/08/2012 17:33 10/08/2012 21:08 10/09/2012 07:51 10/09/2012 11:27  Glucose-Capillary Latest Range: 70-99 mg/dL 229 (H) 176 (H) 171 (H) 145 (H) 210 (H) 182 (H)   Note: On July 25th home dose of Levemir 15 units was stopped due to hypoglycemia.  Patient may benefit from half of home dose-- Levemir 8 units, at HS with titration based on CBG's.  Thank you.  Rosemae Mcquown S. Marcelline Mates, RN, CNS, CDE Inpatient Diabetes Program, team pager [phone]

## 2012-10-09 NOTE — Progress Notes (Signed)
NUTRITION FOLLOW UP  Intervention:   If pt continues to be unable to tolerate oral intake, would likely benefit from supplemental enteral nutrition via a post pyloric feeding tube.   Nutrition Dx:   Inadequate oral intake related to nausea and vomiting as evidenced by pt report. Ongoing  Goal:   Pt will meet >/=90% estimated nutrition needs. Unmet  Monitor:   PO intake, weight trends, labs   Assessment:   Pt continues with N/V. Stopped when he was made NPO for gastric emptying study today.  Reports he cannot keep anything down, except a little ice/water. Has not tolerated [employer].   S/p EGD on 8/3, WNL   Height: Ht Readings from Last 1 Encounters:  09/26/12 5' 10.87" (1.8 m)    Weight Status:   Wt Readings from Last 1 Encounters:  10/08/12 157 lb 10.1 oz (71.501 kg)  decreasing from weight of 166 lbs on 7/25.   Re-estimated needs:  Kcal: 2050-2200  Protein: 90 - 110 g  Fluid: 2 liters daily  Skin: incisions in L thigh and R leg   Diet Order: Clear Liquid   Intake/Output Summary (Last 24 hours) at 10/09/12 1402 Last data filed at 10/09/12 0759  Gross per 24 hour  Intake    550 ml  Output   1330 ml  Net   -780 ml    Last BM: 7/28   Labs:   Recent Labs Lab 10/06/12 0500 10/07/12 0555 10/08/12 0500  NA 144 146* 146*  K 4.0 3.3* 3.7  CL 110 110 111  CO2 19 18* 17*  BUN 39* 33* 30*  CREATININE 3.44* 3.12* 2.80*  CALCIUM 8.8 8.9 9.0  PHOS 5.8* 5.2* 4.8*  GLUCOSE 195* 182* 212*    CBG (last 3)   Recent Labs  10/08/12 2108 10/09/12 0751 10/09/12 1127  GLUCAP 145* 210* 182*    Scheduled Meds: . antiseptic oral rinse  15 mL Mouth Rinse BID  . aspirin  325 mg Oral BID  . darbepoetin (ARANESP) injection - NON-DIALYSIS  150 mcg Subcutaneous Q Mon-1800  . enoxaparin (LOVENOX) injection  30 mg Subcutaneous Q24H  . feeding supplement  1 Container Oral BID BM  . FLUoxetine  20 mg Oral Daily  . insulin aspart  0-9 Units Subcutaneous TID WC   . metoCLOPramide (REGLAN) injection  10 mg Intravenous TID AC & HS  . metoprolol  5 mg Intravenous Q6H  . pantoprazole (PROTONIX) IV  40 mg Intravenous Q24H  . polyethylene glycol  17 g Oral Daily  . sodium bicarbonate  1,300 mg Oral TID    Continuous Infusions: . sodium chloride 50 mL/hr at 10/09/12 1113  . sodium chloride      Orson Slick RD, LDN Pager [phone] After Hours pager [phone]

## 2012-10-09 NOTE — Progress Notes (Signed)
Subjective: Still nauseated.  No vomiting  Objective: Vital signs in last 24 hours: Temp:  [97.9 F (36.6 C)-99.2 F (37.3 C)] 97.9 F (36.6 C) (08/04 0508) Pulse Rate:  [74-86] 85 (08/04 0508) Resp:  [10-24] 16 (08/04 0508) BP: (111-167)/(62-90) 156/79 mmHg (08/04 0508) SpO2:  [95 %-100 %] 100 % (08/04 0508) Weight:  [71.501 kg (157 lb 10.1 oz)] 71.501 kg (157 lb 10.1 oz) (08/03 2105) Weight change: 0 kg (0 lb) Last BM Date: 10/05/12   PE: GEN:  Depressed mood, flat affect, NAD  Lab Results: CBC    Component Value Date/Time   WBC 12.5* 10/08/2012 0500   RBC 2.57* 10/08/2012 0500   RBC 3.22* 09/27/2012 1615   HGB 7.1* 10/08/2012 0500   HCT 21.2* 10/08/2012 0500   PLT 491* 10/08/2012 0500   MCV 82.5 10/08/2012 0500   MCH 27.6 10/08/2012 0500   MCHC 33.5 10/08/2012 0500   RDW 19.3* 10/08/2012 0500   LYMPHSABS 1.2 10/01/2012 2238   MONOABS 1.1* 10/01/2012 2238   EOSABS 0.1 10/01/2012 2238   BASOSABS 0.0 10/01/2012 2238   CMP     Component Value Date/Time   NA 146* 10/08/2012 0500   K 3.7 10/08/2012 0500   CL 111 10/08/2012 0500   CO2 17* 10/08/2012 0500   GLUCOSE 212* 10/08/2012 0500   BUN 30* 10/08/2012 0500   CREATININE 2.80* 10/08/2012 0500   CALCIUM 9.0 10/08/2012 0500   PROT 7.8 10/08/2012 0500   ALBUMIN 1.8* 10/08/2012 0500   AST 19 10/08/2012 0500   ALT 17 10/08/2012 0500   ALKPHOS 214* 10/08/2012 0500   BILITOT 1.2 10/08/2012 0500   GFRNONAA 23* 10/08/2012 0500   GFRAA 26* 10/08/2012 0500   Assessment:  1.  Nausea and vomiting.  No abdominal pain.  Suspect diabetic gastroparesis. 2.  Sludge in GB.  Doubt nausea/vomiting symptoms at present are biliary-related.  Plan:  1.  Gastric emptying study today. 2.  Will revisit tomorrow.   Landry Dyke 10/09/2012, 8:43 AM

## 2012-10-09 NOTE — Progress Notes (Signed)
PATIENT ID: [name]  MRN: [id number]  DOB/AGE:  [birth year] / 62 y.o.  1 Day Post-Op Procedure(s) (LRB): ESOPHAGOGASTRODUODENOSCOPY (EGD) (N/A)    PROGRESS NOTE Subjective:   Patient is alert, oriented, yes Nausea, no Vomiting, yes passing gas, yes Bowel Movement. Denies SOB, Chest or Calf Pain. Using Incentive Spirometer, PAS in place. , Patient reports pain as mild,  Objective: Vital signs in last 24 hours: Temp:  [97.9 F (36.6 C)-99 F (37.2 C)] 98.5 F (36.9 C) (08/04 0940) Pulse Rate:  [85-89] 89 (08/04 0940) Resp:  [15-17] 17 (08/04 0940) BP: (142-160)/(74-79) 160/76 mmHg (08/04 0940) SpO2:  [98 %-100 %] 100 % (08/04 0940) Weight:  [71.501 kg (157 lb 10.1 oz)] 71.501 kg (157 lb 10.1 oz) (08/03 2105)    Intake/Output from previous day: I/O last 3 completed shifts: In: 1933.3 [P.O.:720; I.V.:1213.3] Out: 1700 [Urine:1700]   Intake/Output this shift: Total I/O In: 0  Out: 330 [Urine:330]   LABORATORY DATA:  Recent Labs  10/07/12 0555  10/08/12 0500  10/08/12 2108 10/09/12 0751 10/09/12 1127  WBC  --   --  12.5*  --   --   --   --   HGB  --   --  7.1*  --   --   --   --   HCT  --   --  21.2*  --   --   --   --   PLT  --   --  491*  --   --   --   --   NA 146*  --  146*  --   --   --   --   K 3.3*  --  3.7  --   --   --   --   CL 110  --  111  --   --   --   --   CO2 18*  --  17*  --   --   --   --   BUN 33*  --  30*  --   --   --   --   CREATININE 3.12*  --  2.80*  --   --   --   --   GLUCOSE 182*  --  212*  --   --   --   --   GLUCAP  --   < >  --   < > 145* 210* 182*  CALCIUM 8.9  --  9.0  --   --   --   --   < > = values in this interval not displayed.  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  No signs of cellulitis.  No discharge or drainage. Assessment:   1 Day Post-Op Procedure(s) (LRB): ESOPHAGOGASTRODUODENOSCOPY (EGD) (N/A) ADDITIONAL DIAGNOSIS:  Diabetes, Renal Insufficiency Chronic and Renal Insufficiency  Acute  Plan:  Pt will need a wheelchair for ambulation.  DVT Prophylaxis:  Aspirin  DISCHARGE PLAN: Skilled Nursing Facility/RehabWhen cleared by medicine.  Patient staples removed without difficulty.  No signs of drainage.  His wound was then re bandaged with Kerlix and Ace bandage.      Ary Rudnick R 10/09/2012, 1:18 PM

## 2012-10-09 NOTE — Progress Notes (Signed)
[facility] PROGRESS NOTE  [name] [id number] DOB: 12/04/1950 DOA: 09/26/2012  PCP: Does not have a PCP  Brief HPI: [name] is a 62 y.o. 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 Patient endorsed some fevers, and chills. Patient also noted some warmth and slight erythema in the left lower extremity that has superficial lesions. He was found to have wet gangrene in his left leg. He underwent Left BKA on 09/27/12. He developed ARF in the interim and is being seen by Nephrology as well.  Assessment/Plan:  Nausea and Vomiting Patient continues to have nausea and has had multiple episodes of emesis. No abdominal pain or tenderness. This could be from diabetic gastroparesis. But he hasn't shown much improvement with oral or IV reglan. There definitely could be a psychosomatic element considering his depressed state but unable to completely rule out organic reason. US abdomen showed some sludge/stones in GB. No acute cholecystitis. AAS done yesterday was unremarkable. PPI IV. GI is following. GES ordered. Has undergone EGD 8/3. Patient does not want feeding tube.   Fever  Fever has subsided. Was likely from leg lesions. Cultures have been negative. Diflucan was stopped on 7/30 after discussing with ID. WBC's stable  Gangrenous Left lower extremity He is now s/p left BKA as well. He has been cleared by Ortho. He will see them in follow up. Please review their recommendations listed on note on 10/03/12.   Acute on chronic kidney disease  Baseline creatinine 1.2-1.4. Renal ultrasound reportedly showing no hydronephrosis, BL echogenic kidneys consistent with medical renal disease. Nephrology following. Creatinine is improving slowly. On bicarb. Check labs  in AM. Nephrology has signed off.   Hypertension  Stable and relatively well controlled. Continue home regimen of hydralazine and metoprolol.   UTI  Positive for yeast. Per ID recommendations and given ongoing renal function problems no need for diflucan. He completed 3 days of treatment and is currently asymptomatic.  Type 2 diabetes  Hemoglobin A1c was 5.9 on 09/06/2012. Continue SSI. Not on LA insulin presumably due to poor oral intake. CBG between 160-230. Can consider starting low dose levemir once oral intake improves. CBG well controlled on SS alone.   Anemia  Likely anemia of chronic disease. Transfused 2 units of PRBC since admission. If hgb < 7.0 would transfuse more. Continue aranesp   Leukocytosis  Stable. See above as well.  History of CVA  Continue aspirin 325 mg daily.   Depression Seen by Psyche. Prozac. Not suicidal. But does have flat affect.   Prophylaxis  PPI for GI prophylaxis. Lovenox for DVT prophylaxis.   Code Status: Full  Family Communication: Discussed with patient Disposition Plan: Patient wants to go to his sister's place instead of SNF. I have explained that she may not be able to provide the care that he will initially considering recent bil amputation. He will think about it. Await improvement in renal function and nausea.  Consultants:  Ortho: Dr. Mayer Camel  Nephrology   [facility]  Procedures:  Left BKA  EGD 8/3  Antibiotics:  Vancomycin and Zosyn d/c 7/25  Antifungal Fluconazole 10/01/12--10/04/12  HPI/Subjective: Continues to have vomiting. No other complaints.   Objective: Filed Vitals:   10/08/12 1735 10/08/12 2105 10/09/12 0508 10/09/12 0940  BP: 142/74 160/77 156/79 160/76  Pulse: 86 85 85 89  Temp: 99 F (37.2 C) 98 F (36.7 C) 97.9 F (36.6 C) 98.5 F (36.9 C)  TempSrc: Oral Oral Oral Oral  Resp: 15 15 16 17   Height:      Weight:  71.501 kg (157 lb 10.1 oz)    SpO2: 98% 99% 100% 100%    Intake/Output Summary  (Last 24 hours) at 10/09/12 1330 Last data filed at 10/09/12 0759  Gross per 24 hour  Intake    550 ml  Output   1330 ml  Net   -780 ml   Filed Weights   10/06/12 2236 10/07/12 2034 10/08/12 2105  Weight: 71.5 kg (157 lb 10.1 oz) 71.501 kg (157 lb 10.1 oz) 71.501 kg (157 lb 10.1 oz)    Exam:   General:  Pt in NAD, Alert and awake; flat mood  Cardiovascular: S1S2 normal, regular. No rubs, murmurs or bruits.   Respiratory: CTA BL, no wheezes  Abdomen: soft, NT, ND. BS present. No masses or organomegaly.  Musculoskeletal: Left and right BKA.    Data Reviewed: Basic Metabolic Panel:  Recent Labs Lab 10/04/12 0602 10/05/12 0515 10/06/12 0500 10/07/12 0555 10/08/12 0500  NA 138 141 144 146* 146*  K 3.8 3.5 4.0 3.3* 3.7  CL 104 108 110 110 111  CO2 19 20 19  18* 17*  GLUCOSE 133* 189* 195* 182* 212*  BUN 48* 44* 39* 33* 30*  CREATININE 4.24* 3.97* 3.44* 3.12* 2.80*  CALCIUM 8.5 8.5 8.8 8.9 9.0  PHOS 6.3* 6.3* 5.8* 5.2* 4.8*   Liver Function Tests:  Recent Labs Lab 10/04/12 0602 10/05/12 0515 10/06/12 0500 10/07/12 0555 10/08/12 0500  AST  --   --   --   --  19  ALT  --   --   --   --  17  ALKPHOS  --   --   --   --  214*  BILITOT  --   --   --   --  1.2  PROT  --   --   --   --  7.8  ALBUMIN 1.4* 1.6* 1.6* 1.6* 1.8*   CBC:  Recent Labs Lab 10/03/12 0626 10/04/12 0602 10/05/12 0515 10/08/12 0500  WBC 10.1 11.0* 11.5* 12.5*  HGB 7.1* 7.0* 7.1* 7.1*  HCT 20.8* 20.5* 20.9* 21.2*  MCV 79.4 80.4 80.7 82.5  PLT 393 415* 435* 491*   CBG:  Recent Labs Lab 10/08/12 1238 10/08/12 1733 10/08/12 2108 10/09/12 0751 10/09/12 1127  GLUCAP 176* 171* 145* 210* 182*    Recent Results (from the past 240 hour(s))  URINE CULTURE     Status: None   Collection Time    09/29/12  2:17 PM      Result Value Range Status   Specimen Description URINE, CLEAN CATCH   Final   Special Requests NONE   Final   Culture  Setup Time 09/29/2012 16:16   Final   Colony  Count >=100,000 COLONIES/ML   Final   Culture YEAST   Final   Report Status 09/30/2012 FINAL   Final  CULTURE, BLOOD (ROUTINE X 2)     Status: None   Collection Time    10/01/12 10:30 PM      Result Value Range Status   Specimen Description BLOOD LEFT ARM   Final   Special Requests BOTTLES DRAWN AEROBIC AND ANAEROBIC 10CC EA   Final   Culture  Setup Time 10/02/2012 10:38   Final   Culture NO GROWTH 5 DAYS  Final   Report Status 10/08/2012 FINAL   Final  CULTURE, BLOOD (ROUTINE X 2)     Status: None   Collection Time    10/01/12 10:35 PM      Result Value Range Status   Specimen Description BLOOD LEFT HAND   Final   Special Requests BOTTLES DRAWN AEROBIC ONLY 5CC   Final   Culture  Setup Time 10/02/2012 10:38   Final   Culture NO GROWTH 5 DAYS   Final   Report Status 10/08/2012 FINAL   Final     Studies: US Abdomen Complete  10/08/2012   *RADIOLOGY REPORT*  Clinical Data:  Persistent nausea/vomiting  COMPLETE ABDOMINAL ULTRASOUND  Comparison:  None.  Findings:  Gallbladder:  Gallbladder is mildly distended with gallbladder sludge and/or small stones.  Gallbladder wall of the upper limits of normal, measuring 4 mm.  Negative sonographic Murphy's sign.  Common bile duct:  Measures 4 mm.  Liver:  No focal lesion identified.  Within normal limits in parenchymal echogenicity.  IVC:  Poorly visualized.  Pancreas:  Not visualized due to overlying bowel gas.  Spleen:  Measures 5.0 cm.  Right Kidney:  Measures 12.6 cm.  Echogenic renal parenchyma.  No hydronephrosis.  Left Kidney:  Measures 11.6 cm.  Echogenic renal parenchyma.  No hydronephrosis.  Abdominal aorta:  No aneurysm identified.  Additional comments:  Small bilateral pleural effusions.  IMPRESSION: Mildly distended gallbladder with gallbladder sludge and/or small stones.  No associated sonographic findings suggest acute cholecystitis.  Echogenic renal parenchyma, suggesting medical renal disease.  No hydronephrosis.  Small bilateral pleural  effusions.   Original Report Authenticated By: Julian Hy, M.D.   Dg Abd Acute W/chest  10/07/2012   *RADIOLOGY REPORT*  Clinical Data: Nausea, vomiting.  Epigastric pain.  ACUTE ABDOMEN SERIES (ABDOMEN 2 VIEW & CHEST 1 VIEW)  Comparison: 10/01/2012  Findings: Prior CABG.  Right PICC line is unchanged.  Cardiomegaly. No confluent opacities or effusions.  Nonobstructive bowel gas pattern.  No free air.  No organomegaly or suspicious calcification.  No acute bony abnormality. Old, possibly healing left sixth and seventh rib fractures, unchanged.  IMPRESSION: Cardiomegaly.  Prior CABG.  No acute cardiopulmonary disease.  No evidence of bowel obstruction or free air.   Original Report Authenticated By: Rolm Baptise, M.D.    Scheduled Meds: . antiseptic oral rinse  15 mL Mouth Rinse BID  . aspirin  325 mg Oral BID  . darbepoetin (ARANESP) injection - NON-DIALYSIS  150 mcg Subcutaneous Q Mon-1800  . enoxaparin (LOVENOX) injection  30 mg Subcutaneous Q24H  . feeding supplement  1 Container Oral BID BM  . FLUoxetine  20 mg Oral Daily  . insulin aspart  0-9 Units Subcutaneous TID WC  . metoCLOPramide (REGLAN) injection  10 mg Intravenous TID AC & HS  . metoprolol  5 mg Intravenous Q6H  . pantoprazole (PROTONIX) IV  40 mg Intravenous Q24H  . polyethylene glycol  17 g Oral Daily  . sodium bicarbonate  1,300 mg Oral TID   Continuous Infusions: . sodium chloride 50 mL/hr at 10/09/12 1113  . sodium chloride      Principal Problem:   Gangrene of foot Left Active Problems:   H/O: CVA (cerebrovascular accident)   Hypertension   Anemia   DM (diabetes mellitus) type 2, uncontrolled, with ketoacidosis   Nausea with vomiting   Fever   UTI (lower urinary tract infection)   Wound eschar of foot   Major depressive disorder, recurrent episode, moderate  [facility]  [facility] Pager [phone]   If 7PM-7AM, please contact night-coverage at [email or web address], password [facility] 10/09/2012,  1:30 PM  LOS: 13 days

## 2012-10-09 NOTE — Progress Notes (Signed)
Physical Therapy Treatment Patient Details Name: [name] MRN: [id number] DOB: [birth year] Today's Date: 10/09/2012 Time: [id number] PT Time Calculation (min): 17 min  PT Assessment / Plan / Recommendation  History of Present Illness pt presents with Bil BKA.     PT Comments   Pt agreeable to OOB to chair.  Very flat affect.    Follow Up Recommendations  SNF     Does the patient have the potential to tolerate intense rehabilitation     Barriers to Discharge        Equipment Recommendations  None recommended by PT    Recommendations for Other Services    Frequency Min 3X/week   Progress towards PT Goals Progress towards PT goals: Progressing toward goals  Plan Current plan remains appropriate    Precautions / Restrictions Precautions Precautions: Fall Restrictions Weight Bearing Restrictions: Yes RLE Weight Bearing: Non weight bearing LLE Weight Bearing: Non weight bearing   Pertinent Vitals/Pain Indicates pain is "not bad"    Mobility  Bed Mobility Bed Mobility: Supine to Sit Supine to Sit: 6: Modified independent (Device/Increase time) Transfers Transfers: Lateral/Scoot Transfers Lateral/Scoot Transfers: 4: Min assist;With armrests removed Details for Transfer Assistance: pt required use of pad under hips to A with lateral scoot to drop arm recliner.   Ambulation/Gait Ambulation/Gait Assistance: Not tested (comment) Stairs: No Wheelchair Mobility Wheelchair Mobility: No    Exercises     PT Diagnosis:    PT Problem List:   PT Treatment Interventions:     PT Goals (current goals can now be found in the care plan section) Acute Rehab PT Goals Time For Goal Achievement: 10/12/12 Potential to Achieve Goals: Good  Visit Information  Last PT Received On: 10/09/12 Assistance Needed: +1 History of Present Illness: pt presents with Bil BKA.      Subjective Data  Subjective: "Ok"   Cognition  Cognition Arousal/Alertness: Awake/alert Behavior During  Therapy: Flat affect Overall Cognitive Status: Within Functional Limits for tasks assessed    Balance  Balance Balance Assessed: Yes Dynamic Sitting Balance Dynamic Sitting - Balance Support: Bilateral upper extremity supported;Feet unsupported;During functional activity Dynamic Sitting - Level of Assistance: 5: Stand by assistance Dynamic Sitting - Comments: pt demos good balance during lateral scooting.    End of Session PT - End of Session Activity Tolerance: Patient tolerated treatment well Patient left: in chair;with call bell/phone within reach;with family/visitor present Nurse Communication: Mobility status   GP     Catarina Hartshorn, [place] 10/09/2012, 3:26 PM

## 2012-10-10 ENCOUNTER — Inpatient Hospital Stay (HOSPITAL_COMMUNITY): Payer: Medicare Other

## 2012-10-10 DIAGNOSIS — E870 Hyperosmolality and hypernatremia: Secondary | ICD-10-CM

## 2012-10-10 DIAGNOSIS — R05 Cough: Secondary | ICD-10-CM

## 2012-10-10 LAB — GLUCOSE, CAPILLARY
Glucose-Capillary: 229 mg/dL — ABNORMAL HIGH (ref 70–99)
Glucose-Capillary: 165 mg/dL — ABNORMAL HIGH (ref 70–99)

## 2012-10-10 LAB — CBC
MCV: 83.4 fL (ref 78.0–100.0)
Platelets: 510 10*3/uL — ABNORMAL HIGH (ref 150–400)
RBC: 2.77 MIL/uL — ABNORMAL LOW (ref 4.22–5.81)
WBC: 15.8 10*3/uL — ABNORMAL HIGH (ref 4.0–10.5)

## 2012-10-10 LAB — BASIC METABOLIC PANEL
CO2: 19 mEq/L (ref 19–32)
Chloride: 116 mEq/L — ABNORMAL HIGH (ref 96–112)
Potassium: 3.1 mEq/L — ABNORMAL LOW (ref 3.5–5.1)
Sodium: 151 mEq/L — ABNORMAL HIGH (ref 135–145)

## 2012-10-10 MED ORDER — INSULIN DETEMIR 100 UNIT/ML ~~LOC~~ SOLN
10.0000 [IU] | Freq: Every day | SUBCUTANEOUS | Status: DC
  Administered 2012-10-10 – 2012-10-20 (×11): 10 [IU] via SUBCUTANEOUS
  Filled 2012-10-10 (×11): qty 0.1

## 2012-10-10 MED ORDER — DEXTROSE 5 % IV SOLN
INTRAVENOUS | Status: DC
  Administered 2012-10-10 – 2012-10-15 (×9): via INTRAVENOUS

## 2012-10-10 MED ORDER — ENOXAPARIN SODIUM 40 MG/0.4ML ~~LOC~~ SOLN
40.0000 mg | SUBCUTANEOUS | Status: DC
  Administered 2012-10-10 – 2012-10-19 (×10): 40 mg via SUBCUTANEOUS
  Filled 2012-10-10 (×11): qty 0.4

## 2012-10-10 NOTE — Progress Notes (Signed)
[facility] PROGRESS NOTE  [name] [id number] DOB: 10/16/1950 DOA: 09/26/2012  PCP: Does not have a PCP  Brief HPI: [name] is a 62 y.o. 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 Patient endorsed some fevers, and chills. Patient also noted some warmth and slight erythema in the left lower extremity that has superficial lesions. He was found to have wet gangrene in his left leg. He underwent Left BKA on 09/27/12. He developed ARF in the interim and was being seen by Nephrology as well. His main issue now is persistent nausea and vomiting and inability to tolerate orally.  Assessment/Plan:  Nausea and Vomiting Patient continues to have nausea and vomiting. Minimal abdominal pain in upper abdomen. This could be from diabetic gastroparesis. But he hasn't shown much improvement with oral or IV reglan. Appreciate GI input. GES is pending still. There definitely could be a psychosomatic element considering his depressed state but unable to completely rule out organic reason. US abdomen showed some sludge/stones in GB. No acute cholecystitis. AAS done yesterday was unremarkable. PPI IV. Has undergone EGD 8/3. Patient will rethink having a Panda tube placed if there is no other option. Will await GI input and GES. Will also get SLP input as he mentions cough when he drinks. He does have a history of stroke.  Severe Malnutrition Secondary to acute illness and very poor oral intake. We may need to feed him through panda. Patient has been reluctant but may reconsider. Will need to decide after GES is done and if still no relief with IV reglan.   Cough  As he has been vomiting he could have aspirated. Will get CXR.  Hypernatremia Will change IVF to D5.  Recheck lytes in AM.  Acute on chronic kidney disease  Baseline creatinine 1.2-1.4. Renal ultrasound reportedly showing no hydronephrosis, BL echogenic kidneys consistent with medical renal disease. Nephrology has signed off. Creatinine is improving slowly. On bicarb.   Gangrenous Left lower extremity He is now s/p left BKA as well. He has been cleared by Ortho. He will see them in follow up. Please review their recommendations listed on note on 10/03/12.   Hypertension  BP running slightly high but stable. Continue home regimen of hydralazine and metoprolol.   UTI  Positive for yeast. Per ID recommendations and given ongoing renal function problems no need for diflucan. He completed 3 days of treatment and is currently asymptomatic.  Type 2 diabetes  Hemoglobin A1c was 5.9 on 09/06/2012. Continue SSI. Was not on LA insulin presumably due to poor oral intake. CBG between 160-230. Since he will be started on D5 today, will initiate Levemir.   Anemia  Likely anemia of chronic disease. Stable. Transfused 2 units of PRBC since admission. If hgb < 7.0 would transfuse more. Continue aranesp   Fever  Fever has subsided. Was likely from leg lesions. Cultures have been negative. Diflucan was stopped on 7/30 after discussing with ID. WBC noted to be higher today.  Leukocytosis  Slightly higher than usual. Will get CXR. Remains afebrile.   History of CVA  Continue aspirin 325 mg daily.   Depression Seen by Psyche. Prozac. Not suicidal. But does have flat affect.   Prophylaxis  PPI for GI prophylaxis. Lovenox for DVT prophylaxis.   Code Status: Full  Family Communication: Discussed with  patient Disposition Plan: Not ready for discharge. Will need SNF. Initially patient wanted to go to his sister's place instead of SNF. I have explained that she may not be able to provide the care that he will need initially considering recent bil amputation.   Consultants:  Ortho: Dr. Mayer Camel (signed  off)  Nephrology (signed off)   [facility]  Psychiatry (signed off)  Procedures:  Left BKA  EGD 8/3  Antibiotics:  Vancomycin and Zosyn d/c 7/25  Antifungal Fluconazole 10/01/12--10/04/12  HPI/Subjective: Continues to have vomiting. Cough occasionally with clear expectoration.   Objective: Filed Vitals:   10/09/12 1836 10/09/12 2047 10/10/12 0031 10/10/12 0516  BP: 164/81 162/80 158/83 152/84  Pulse: 92 88 89 90  Temp: 98 F (36.7 C) 98 F (36.7 C)  98.2 F (36.8 C)  TempSrc:  Oral  Oral  Resp: 17 17  17   Height:      Weight:  71.501 kg (157 lb 10.1 oz)    SpO2: 99% 95%  96%    Intake/Output Summary (Last 24 hours) at 10/10/12 0742 Last data filed at 10/10/12 0618  Gross per 24 hour  Intake    925 ml  Output   1455 ml  Net   -530 ml   Filed Weights   10/07/12 2034 10/08/12 2105 10/09/12 2047  Weight: 71.501 kg (157 lb 10.1 oz) 71.501 kg (157 lb 10.1 oz) 71.501 kg (157 lb 10.1 oz)    Exam:   General:  Pt in NAD, Alert and awake; flat mood  Cardiovascular: S1S2 normal, regular. No rubs, murmurs or bruits.   Respiratory: Few crackles bil bases r>l. No wheezing.   Abdomen: soft, NT, ND. BS present. No masses or organomegaly.  Musculoskeletal: Left and right BKA.    Data Reviewed: Basic Metabolic Panel:  Recent Labs Lab 10/04/12 0602 10/05/12 0515 10/06/12 0500 10/07/12 0555 10/08/12 0500 10/10/12 0500  NA 138 141 144 146* 146* 151*  K 3.8 3.5 4.0 3.3* 3.7 3.1*  CL 104 108 110 110 111 116*  CO2 19 20 19  18* 17* 19  GLUCOSE 133* 189* 195* 182* 212* 226*  BUN 48* 44* 39* 33* 30* 29*  CREATININE 4.24* 3.97* 3.44* 3.12* 2.80* 2.29*  CALCIUM 8.5 8.5 8.8 8.9 9.0 9.1  PHOS 6.3* 6.3* 5.8* 5.2* 4.8*  --    Liver Function Tests:  Recent Labs Lab 10/04/12 0602 10/05/12 0515 10/06/12 0500 10/07/12 0555 10/08/12 0500  AST  --   --   --   --  19  ALT  --   --   --   --  17  ALKPHOS  --   --   --   --  214*  BILITOT  --   --   --   --  1.2   PROT  --   --   --   --  7.8  ALBUMIN 1.4* 1.6* 1.6* 1.6* 1.8*   CBC:  Recent Labs Lab 10/04/12 0602 10/05/12 0515 10/08/12 0500 10/10/12 0500  WBC 11.0* 11.5* 12.5* 15.8*  HGB 7.0* 7.1* 7.1* 7.6*  HCT 20.5* 20.9* 21.2* 23.1*  MCV 80.4 80.7 82.5 83.4  PLT 415* 435* 491* 510*   CBG:  Recent Labs Lab 10/08/12 2108 10/09/12 0751 10/09/12 1127 10/09/12 1644 10/09/12 2051  GLUCAP 145* 210* 182* 183* 209*    Recent Results (from the past 240 hour(s))  CULTURE, BLOOD (ROUTINE X 2)     Status: None   Collection Time    10/01/12  10:30 PM      Result Value Range Status   Specimen Description BLOOD LEFT ARM   Final   Special Requests BOTTLES DRAWN AEROBIC AND ANAEROBIC 10CC EA   Final   Culture  Setup Time 10/02/2012 10:38   Final   Culture NO GROWTH 5 DAYS   Final   Report Status 10/08/2012 FINAL   Final  CULTURE, BLOOD (ROUTINE X 2)     Status: None   Collection Time    10/01/12 10:35 PM      Result Value Range Status   Specimen Description BLOOD LEFT HAND   Final   Special Requests BOTTLES DRAWN AEROBIC ONLY 5CC   Final   Culture  Setup Time 10/02/2012 10:38   Final   Culture NO GROWTH 5 DAYS   Final   Report Status 10/08/2012 FINAL   Final     Studies: US Abdomen Complete  10/08/2012   *RADIOLOGY REPORT*  Clinical Data:  Persistent nausea/vomiting  COMPLETE ABDOMINAL ULTRASOUND  Comparison:  None.  Findings:  Gallbladder:  Gallbladder is mildly distended with gallbladder sludge and/or small stones.  Gallbladder wall of the upper limits of normal, measuring 4 mm.  Negative sonographic Murphy's sign.  Common bile duct:  Measures 4 mm.  Liver:  No focal lesion identified.  Within normal limits in parenchymal echogenicity.  IVC:  Poorly visualized.  Pancreas:  Not visualized due to overlying bowel gas.  Spleen:  Measures 5.0 cm.  Right Kidney:  Measures 12.6 cm.  Echogenic renal parenchyma.  No hydronephrosis.  Left Kidney:  Measures 11.6 cm.  Echogenic renal parenchyma.  No  hydronephrosis.  Abdominal aorta:  No aneurysm identified.  Additional comments:  Small bilateral pleural effusions.  IMPRESSION: Mildly distended gallbladder with gallbladder sludge and/or small stones.  No associated sonographic findings suggest acute cholecystitis.  Echogenic renal parenchyma, suggesting medical renal disease.  No hydronephrosis.  Small bilateral pleural effusions.   Original Report Authenticated By: Julian Hy, M.D.    Scheduled Meds: . antiseptic oral rinse  15 mL Mouth Rinse BID  . aspirin  325 mg Oral BID  . darbepoetin (ARANESP) injection - NON-DIALYSIS  150 mcg Subcutaneous Q Mon-1800  . enoxaparin (LOVENOX) injection  30 mg Subcutaneous Q24H  . feeding supplement  1 Container Oral BID BM  . FLUoxetine  20 mg Oral Daily  . insulin aspart  0-9 Units Subcutaneous TID WC  . insulin detemir  10 Units Subcutaneous Daily  . metoCLOPramide (REGLAN) injection  10 mg Intravenous TID AC & HS  . metoprolol  5 mg Intravenous Q6H  . pantoprazole (PROTONIX) IV  40 mg Intravenous Q24H  . polyethylene glycol  17 g Oral Daily  . sodium bicarbonate  1,300 mg Oral TID   Continuous Infusions: . dextrose      Principal Problem:   Gangrene of foot Left Active Problems:   H/O: CVA (cerebrovascular accident)   Hypertension   Anemia   DM (diabetes mellitus) type 2, uncontrolled, with ketoacidosis   Nausea with vomiting   Fever   UTI (lower urinary tract infection)   Wound eschar of foot   Major depressive disorder, recurrent episode, moderate    Vicci Reder  [facility] Pager [phone]   If 7PM-7AM, please contact night-coverage at [email or web address], password [facility] 10/10/2012, 7:42 AM  LOS: 14 days

## 2012-10-10 NOTE — Progress Notes (Signed)
Gastric emptying study hasn't been done yet.  Will revisit tomorrow, once study has been completed.

## 2012-10-10 NOTE — Progress Notes (Signed)
Occupational Therapy Treatment Patient Details Name: [name] MRN: [id number] DOB: 1950-05-02 Today's Date: 10/10/2012 Time: [id number] OT Time Calculation (min): 20 min  OT Assessment / Plan / Recommendation  History of present illness pt presents with Bil BKA.     OT comments  Pt performed UB strengthening ex's today with yellow theraband in prep for increased participation in ADL's and transfers etc. Pt required Mod vc's and visual cues for proper positioning and follow through with exercises. Encouraged pt to perform 1-2x/day.  Follow Up Recommendations  SNF    Barriers to Discharge       Equipment Recommendations       Recommendations for Other Services    Frequency Min 2X/week   Progress towards OT Goals Progress towards OT goals: Progressing toward goals  Plan Discharge plan remains appropriate    Precautions / Restrictions Precautions Precautions: Fall Restrictions Other Position/Activity Restrictions: bil BKA   Pertinent Vitals/Pain Pt denies pain, no c/o.    ADL  Eating/Feeding: Independent;Performed (Pt eating Ice chips, NPO in prep for testing ) Where Assessed - Eating/Feeding: Bed level ADL Comments: Pt performed UB strengthening ex's today with yellow theraband in prep for increased participation in ADL's and transfers etc. Pt required Mod vc's and visual cues for proper positioning and follow through with exercises.    OT Diagnosis:    OT Problem List:   OT Treatment Interventions:     OT Goals(current goals can now be found in the care plan section)    Visit Information  Last OT Received On: 10/10/12 Assistance Needed: +1 History of Present Illness: pt presents with Bil BKA.      Subjective Data      Prior Functioning       Cognition  Cognition Arousal/Alertness: Awake/alert Behavior During Therapy: Flat affect Overall Cognitive Status: Within Functional Limits for tasks assessed    Mobility  Bed Mobility Bed Mobility: Supine to  Sit Supine to Sit: 6: Modified independent (Device/Increase time);With rails    Exercises  Shoulder Exercises Shoulder Flexion: AROM;Strengthening;Both;10 reps;Seated;Theraband;Other (comment) (VC's & visual cues for proper positioning and follow through) Theraband Level (Shoulder Flexion): Level 1 (Yellow) Shoulder Extension: AROM;Strengthening;Both;10 reps;Theraband;Seated (VC's & visual cues for positioning and follow through) Theraband Level (Shoulder Extension): Level 1 (Yellow) Shoulder ABduction: AROM;Strengthening;Both;10 reps;Seated;Theraband (Horizontal ABD. VC/visual cues for positioning/follow throug) Elbow Flexion: AROM;Strengthening;Both;10 reps;Seated;Theraband;Other (comment) (VC's & visual cues for proper positioning) Theraband Level (Elbow Flexion): Level 1 (Yellow) (VC's & visual cues for positioning and follow through) Elbow Extension: AROM;Strengthening;Both;10 reps;Theraband;Seated;Other (comment) Theraband Level (Elbow Extension): Level 1 (Yellow)      End of Session OT - End of Session Activity Tolerance: Patient tolerated treatment well Patient left: in bed;with call bell/phone within reach;with nursing/sitter in room  [place], [place] 10/10/2012, 12:59 PM

## 2012-10-10 NOTE — Progress Notes (Signed)
10/09/12 22:00 Patient refused 22:00 p.o meds due to nausea despite iv reglan. Iysha Mishkin Joselita,RN

## 2012-10-11 ENCOUNTER — Inpatient Hospital Stay (HOSPITAL_COMMUNITY): Payer: Medicare Other

## 2012-10-11 LAB — BASIC METABOLIC PANEL
BUN: 26 mg/dL — ABNORMAL HIGH (ref 6–23)
Creatinine, Ser: 2.1 mg/dL — ABNORMAL HIGH (ref 0.50–1.35)
GFR calc Af Amer: 37 mL/min — ABNORMAL LOW (ref >90)
GFR calc non Af Amer: 32 mL/min — ABNORMAL LOW (ref >90)

## 2012-10-11 LAB — CBC
HCT: 24.6 % — ABNORMAL LOW (ref 39.0–52.0)
MCHC: 32.9 g/dL (ref 30.0–36.0)
MCV: 82.6 fL (ref 78.0–100.0)
RDW: 19.9 % — ABNORMAL HIGH (ref 11.5–15.5)

## 2012-10-11 LAB — GLUCOSE, CAPILLARY
Glucose-Capillary: 155 mg/dL — ABNORMAL HIGH (ref 70–99)
Glucose-Capillary: 117 mg/dL — ABNORMAL HIGH (ref 70–99)

## 2012-10-11 MED ORDER — POTASSIUM CHLORIDE 10 MEQ/100ML IV SOLN
10.0000 meq | INTRAVENOUS | Status: AC
  Administered 2012-10-11 (×4): 10 meq via INTRAVENOUS
  Filled 2012-10-11 (×4): qty 100

## 2012-10-11 MED ORDER — IOHEXOL 300 MG/ML  SOLN
25.0000 mL | INTRAMUSCULAR | Status: AC
  Administered 2012-10-11 (×2): 25 mL via ORAL

## 2012-10-11 NOTE — Progress Notes (Signed)
PT Cancellation Note  Patient Details Name: [name] MRN: [id number] DOB: 1950/06/16   Cancelled Treatment:    Reason Eval/Treat Not Completed: Patient declined, no reason specified   Catarina Hartshorn, [place] 10/11/2012, 11:54 AM

## 2012-10-11 NOTE — Progress Notes (Signed)
Subjective: Nausea and vomiting persists. Can't tolerate clear liquids. Hasn't been able to tolerate attempts at gastric emptying study x 2.  Objective: Vital signs in last 24 hours: Temp:  [97.5 F (36.4 C)-98.6 F (37 C)] 97.5 F (36.4 C) (08/06 0505) Pulse Rate:  [81-86] 84 (08/06 0505) Resp:  [18] 18 (08/06 0505) BP: (150-166)/(82-94) 163/86 mmHg (08/06 0505) SpO2:  [96 %-98 %] 96 % (08/06 0505) Weight:  [67 kg (147 lb 11.3 oz)] 67 kg (147 lb 11.3 oz) (08/05 2006) Weight change: -4.501 kg (-9 lb 14.8 oz) Last BM Date: 10/05/12  PE: GEN:  Chronically ill-appearing, depressed-appearing, but is not acutely toxic-appearing ABD:  Soft, non-distended, non-tender, hypoactive but present bowel sounds.  Lab Results: CBC    Component Value Date/Time   WBC 15.3* 10/11/2012 0515   RBC 2.98* 10/11/2012 0515   RBC 3.22* 09/27/2012 1615   HGB 8.1* 10/11/2012 0515   HCT 24.6* 10/11/2012 0515   PLT 490* 10/11/2012 0515   MCV 82.6 10/11/2012 0515   MCH 27.2 10/11/2012 0515   MCHC 32.9 10/11/2012 0515   RDW 19.9* 10/11/2012 0515   LYMPHSABS 1.2 10/01/2012 2238   MONOABS 1.1* 10/01/2012 2238   EOSABS 0.1 10/01/2012 2238   BASOSABS 0.0 10/01/2012 2238   CMP     Component Value Date/Time   NA 155* 10/11/2012 0515   K 2.7* 10/11/2012 0515   CL 120* 10/11/2012 0515   CO2 24 10/11/2012 0515   GLUCOSE 185* 10/11/2012 0515   BUN 26* 10/11/2012 0515   CREATININE 2.10* 10/11/2012 0515   CALCIUM 9.2 10/11/2012 0515   PROT 7.8 10/08/2012 0500   ALBUMIN 1.8* 10/08/2012 0500   AST 19 10/08/2012 0500   ALT 17 10/08/2012 0500   ALKPHOS 214* 10/08/2012 0500   BILITOT 1.2 10/08/2012 0500   GFRNONAA 32* 10/11/2012 0515   GFRAA 37* 10/11/2012 0515   Assessment:  1.  Nausea and vomiting.  Gastroparesis is leading consideration.  Symptoms not improving, despite attempts to deescalate diet and despite empiric metoclopramide.  Plan:  1.  Place nasogastric tube to help with his refractory nausea and vomiting. 2.  CT scan abdomen/pelvis, with  oral contrast only (given through NGT), to better exclude any bowel tract obstruction (abdominal xray recently non-specific bowel gas pattern). 3.  Will follow.   Landry Dyke 10/11/2012, 9:41 AM

## 2012-10-11 NOTE — Progress Notes (Signed)
CRITICAL VALUE ALERT  Critical value received: K 2.7   Date of notification:  10/11/12  Time of notification:  [id number]  Critical value read back:yes  Nurse who received alert:  Tora Kindred  MD notified (1st page):    Time of first page:    MD notified (2nd page):  Time of second page:  Responding MD: Verlon Au  Time MD responded:  735-ordered 4 runs K

## 2012-10-11 NOTE — Progress Notes (Signed)
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  Post-op complicated by anemia, hypotension, AKI.  Since surgery has had continued emesis, and GES not possible.    Past medical history-As per Problem list Chart reviewed as below- Admission 09/06/12 for R BKA Admission 07/27/12 for DKA, sepsis 2/2 to infected LE's Admission 01/06/07 for CP-Had CABg x 4  Consultants:  Nephrology  GI  Ortho   Procedures:  EGD 10/08/12=neg  L BKA 09/27/12  Antibiotics:  None currently   Subjective  Couldn't swallow to ge tNGT in.  Seems sad.  No pain, no CP or Stump pain.  No weakness.  No fever or chills   Objective    Interim History: none  Telemetry: none  Objective: Filed Vitals:   10/10/12 2006 10/10/12 2327 10/11/12 0505 10/11/12 0951  BP: 166/84 161/82 163/86 169/77  Pulse: 81 85 84 88  Temp: 98.1 F (36.7 C)  97.5 F (36.4 C) 98.2 F (36.8 C)  TempSrc: Oral  Oral Oral  Resp: 18  18 18   Height: 5' 10.87" (1.8 m)     Weight: 67 kg (147 lb 11.3 oz)     SpO2: 96%  96% 96%    Intake/Output Summary (Last 24 hours) at 10/11/12 1248 Last data filed at 10/11/12 1100  Gross per 24 hour  Intake  547.5 ml  Output    675 ml  Net -127.5 ml    Exam:  General: alert, flat affect.  EOMI, NCAT Cardiovascular: s1 s2 no m/r/g, RRR Respiratory: clear, no added sound Abdomen: soft, NT/ND Skin no LE edema.  BKA bilaterally Neuro grossly intact  Data Reviewed: Basic Metabolic Panel:  Recent Labs Lab 10/05/12 0515 10/06/12 0500 10/07/12 0555 10/08/12 0500 10/10/12 0500 10/11/12 0515  NA 141 144 146* 146* 151* 155*  K 3.5 4.0 3.3* 3.7 3.1* 2.7*  CL 108 110 110 111 116* 120*  CO2 20 19 18* 17* 19 24  GLUCOSE 189* 195* 182* 212* 226* 185*  BUN 44* 39* 33* 30* 29* 26*    CREATININE 3.97* 3.44* 3.12* 2.80* 2.29* 2.10*  CALCIUM 8.5 8.8 8.9 9.0 9.1 9.2  PHOS 6.3* 5.8* 5.2* 4.8*  --   --    Liver Function Tests:  Recent Labs Lab 10/05/12 0515 10/06/12 0500 10/07/12 0555 10/08/12 0500  AST  --   --   --  19  ALT  --   --   --  17  ALKPHOS  --   --   --  214*  BILITOT  --   --   --  1.2  PROT  --   --   --  7.8  ALBUMIN 1.6* 1.6* 1.6* 1.8*   No results found for this basename: LIPASE, AMYLASE,  in the last 168 hours No results found for this basename: AMMONIA,  in the last 168 hours CBC:  Recent Labs Lab 10/05/12 0515 10/08/12 0500 10/10/12 0500 10/11/12 0515  WBC 11.5* 12.5* 15.8* 15.3*  HGB 7.1* 7.1* 7.6* 8.1*  HCT 20.9* 21.2* 23.1* 24.6*  MCV 80.7 82.5 83.4 82.6  PLT 435* 491* 510* 490*   Cardiac Enzymes: No results found for this basename: CKTOTAL, CKMB, CKMBINDEX, TROPONINI,  in the last 168 hours BNP: No components found with this basename: POCBNP,  CBG:  Recent Labs Lab 10/10/12 1146 10/10/12 1651 10/10/12 2207 10/11/12 0749 10/11/12 1127  GLUCAP 183* 165* 157* 183* 149*    Recent Results (from the past 240 hour(s))  CULTURE, BLOOD (ROUTINE X 2)     Status: None   Collection Time    10/01/12 10:30 PM      Result Value Range Status   Specimen Description BLOOD LEFT ARM   Final   Special Requests BOTTLES DRAWN AEROBIC AND ANAEROBIC 10CC EA   Final   Culture  Setup Time 10/02/2012 10:38   Final   Culture NO GROWTH 5 DAYS   Final   Report Status 10/08/2012 FINAL   Final  CULTURE, BLOOD (ROUTINE X 2)     Status: None   Collection Time    10/01/12 10:35 PM      Result Value Range Status   Specimen Description BLOOD LEFT HAND   Final   Special Requests BOTTLES DRAWN AEROBIC ONLY 5CC   Final   Culture  Setup Time 10/02/2012 10:38   Final   Culture NO GROWTH 5 DAYS   Final   Report Status 10/08/2012 FINAL   Final     Studies:              All Imaging reviewed and is as per above notation   Scheduled Meds: .  antiseptic oral rinse  15 mL Mouth Rinse BID  . aspirin  325 mg Oral BID  . darbepoetin (ARANESP) injection - NON-DIALYSIS  150 mcg Subcutaneous Q Mon-1800  . enoxaparin (LOVENOX) injection  40 mg Subcutaneous Q24H  . feeding supplement  1 Container Oral BID BM  . FLUoxetine  20 mg Oral Daily  . insulin aspart  0-9 Units Subcutaneous TID WC  . insulin detemir  10 Units Subcutaneous Daily  . iohexol  25 mL Oral Q1 Hr x 2  . metoCLOPramide (REGLAN) injection  10 mg Intravenous TID AC & HS  . metoprolol  5 mg Intravenous Q6H  . pantoprazole (PROTONIX) IV  40 mg Intravenous Q24H  . polyethylene glycol  17 g Oral Daily  . potassium chloride  10 mEq Intravenous Q1 Hr x 4  . sodium bicarbonate  1,300 mg Oral TID   Continuous Infusions: . dextrose 75 mL/hr at 10/11/12 0800     Assessment/Plan: 1. 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 N/V-unclear source.  NGT to be placed and Contrast to be given for CT, as Gastric emptying study not able to be done.  Appreciate GI input. Might need psychiatry input. Continue Compazine 5mg  po q6 prn 3. Hypernatremia-Water deficit=4.2 liters.  Correct with IV dextrose over the next 30 hours at rate of 0.5 Meq/L/hr  4. Hypokalemia-replacing IV with 40 meq.  Monitor in am 5. Severe malnutrition-Await decision Re: Panda tube.  Will nee to start potentitally protein supplementation to prevent free water exudation 6. Cough? Aspiration-no overt Pna.  Monitor? 7. CHF-on IVF only as cannot take PO.  Monitor 8. DM ty 2-Blood sugars 157-186-continue Levemir 10 Units daily and sensitive Scale coverage  9. Anemia-ABLA + nutritional-Monitor. Continue Aranesp. 10. AKI-stabilising-creat trending downwards.  Cont IVF 11. Yeat UTI-Rx with diflucan 12. H/o CVA-continue ASA 325 daily 13. Depression-consider psych consult-monitor 14. Htn-continue metoprolol 5 mg IV q6h   Code Status: full Family Communication:  None at bedside Disposition Plan: ?   Verneita Griffes,  MD  [facility] Pager [phone] 10/11/2012, 12:48 PM    LOS: 15 days

## 2012-10-12 ENCOUNTER — Inpatient Hospital Stay (HOSPITAL_COMMUNITY): Payer: Medicare Other

## 2012-10-12 LAB — GLUCOSE, CAPILLARY
Glucose-Capillary: 109 mg/dL — ABNORMAL HIGH (ref 70–99)
Glucose-Capillary: 136 mg/dL — ABNORMAL HIGH (ref 70–99)
Glucose-Capillary: 144 mg/dL — ABNORMAL HIGH (ref 70–99)
Glucose-Capillary: 150 mg/dL — ABNORMAL HIGH (ref 70–99)
Glucose-Capillary: 184 mg/dL — ABNORMAL HIGH (ref 70–99)

## 2012-10-12 LAB — CBC WITH DIFFERENTIAL/PLATELET
Eosinophils Absolute: 0.1 10*3/uL (ref 0.0–0.7)
Eosinophils Relative: 1 % (ref 0–5)
HCT: 25.5 % — ABNORMAL LOW (ref 39.0–52.0)
Hemoglobin: 8.4 g/dL — ABNORMAL LOW (ref 13.0–17.0)
Lymphs Abs: 1.9 10*3/uL (ref 0.7–4.0)
MCH: 27.2 pg (ref 26.0–34.0)
MCV: 82.5 fL (ref 78.0–100.0)
Monocytes Relative: 7 % (ref 3–12)
RBC: 3.09 MIL/uL — ABNORMAL LOW (ref 4.22–5.81)

## 2012-10-12 LAB — COMPREHENSIVE METABOLIC PANEL
Alkaline Phosphatase: 177 U/L — ABNORMAL HIGH (ref 39–117)
BUN: 22 mg/dL (ref 6–23)
GFR calc Af Amer: 43 mL/min — ABNORMAL LOW (ref >90)
Glucose, Bld: 182 mg/dL — ABNORMAL HIGH (ref 70–99)
Potassium: 2.4 mEq/L — CL (ref 3.5–5.1)
Total Protein: 7.5 g/dL (ref 6.0–8.3)

## 2012-10-12 MED ORDER — TECHNETIUM TC 99M MEBROFENIN IV KIT
5.0000 | PACK | Freq: Once | INTRAVENOUS | Status: AC | PRN
  Administered 2012-10-12: 5 via INTRAVENOUS

## 2012-10-12 MED ORDER — MENTHOL 3 MG MT LOZG
1.0000 | LOZENGE | OROMUCOSAL | Status: DC | PRN
  Administered 2012-10-12: 3 mg via ORAL
  Filled 2012-10-12: qty 9

## 2012-10-12 MED ORDER — POTASSIUM CHLORIDE 10 MEQ/100ML IV SOLN
10.0000 meq | INTRAVENOUS | Status: AC
  Administered 2012-10-12 (×5): 10 meq via INTRAVENOUS
  Filled 2012-10-12 (×5): qty 100

## 2012-10-12 MED ORDER — FREE WATER
400.0000 mL | Freq: Three times a day (TID) | Status: DC
  Administered 2012-10-13 – 2012-10-14 (×3): 400 mL

## 2012-10-12 MED ORDER — INSULIN ASPART 100 UNIT/ML ~~LOC~~ SOLN
0.0000 [IU] | Freq: Once | SUBCUTANEOUS | Status: AC
  Administered 2012-10-12: 2 [IU] via SUBCUTANEOUS

## 2012-10-12 MED ORDER — INSULIN ASPART 100 UNIT/ML ~~LOC~~ SOLN
0.0000 [IU] | SUBCUTANEOUS | Status: DC
  Administered 2012-10-12 (×2): 1 [IU] via SUBCUTANEOUS
  Administered 2012-10-12: 2 [IU] via SUBCUTANEOUS
  Administered 2012-10-13: 1 [IU] via SUBCUTANEOUS
  Administered 2012-10-13: 2 [IU] via SUBCUTANEOUS
  Administered 2012-10-13: 1 [IU] via SUBCUTANEOUS
  Administered 2012-10-13: 2 [IU] via SUBCUTANEOUS
  Administered 2012-10-14: 1 [IU] via SUBCUTANEOUS
  Administered 2012-10-14 (×2): 2 [IU] via SUBCUTANEOUS
  Administered 2012-10-15: 1 [IU] via SUBCUTANEOUS

## 2012-10-12 MED ORDER — MORPHINE SULFATE 4 MG/ML IJ SOLN
3.0000 mg | Freq: Once | INTRAMUSCULAR | Status: AC
  Administered 2012-10-12: 3 mg via INTRAVENOUS

## 2012-10-12 MED ORDER — ASPIRIN 300 MG RE SUPP
300.0000 mg | Freq: Two times a day (BID) | RECTAL | Status: DC
  Administered 2012-10-12 – 2012-10-17 (×11): 300 mg via RECTAL
  Filled 2012-10-12 (×17): qty 1

## 2012-10-12 NOTE — Progress Notes (Signed)
NUTRITION FOLLOW UP  Intervention:   1. Once pt has NJ tube placed and confirmed, initiate Vital 1.5 @ 20 ml/hr and advance by 10 ml q 4 hr to a goal of 60 ml/hr. This will provide 2160 kcal, 97 gm protein, and 1100 ml free water daily.   Nutrition Dx:   Inadequate oral intake related to nausea and vomiting as evidenced by pt report. Ongoing  Goal:   Pt will meet >/=90% estimated nutrition needs. Unmet  Monitor:   PO intake, weight trends, labs   Assessment:   Pt was not able to tolerate attempts at gastric emptying study x2. Continues with N/V. Was able to place NGT to suction with some improvement in nausea. Pt is currently NPO. Planned for NJ tube placement after HIDA scan completed. RD consulted for initiation and management of enteral nutrition. Will place orders once NJ tube is in.    Height: Ht Readings from Last 1 Encounters:  10/11/12 5' 10.87" (1.8 m)    Weight Status:   Wt Readings from Last 1 Encounters:  10/11/12 148 lb 5.9 oz (67.3 kg)  decreasing from weight of 166 lbs on 7/25.   Re-estimated needs:  Kcal: 2050-2200  Protein: 90 - 110 g  Fluid: 2 liters daily  Skin: incisions in L thigh and R leg   Diet Order: NPO   Intake/Output Summary (Last 24 hours) at 10/12/12 1057 Last data filed at 10/12/12 1003  Gross per 24 hour  Intake 2846.67 ml  Output   3078 ml  Net -231.33 ml    Last BM: 7/28   Labs:   Recent Labs Lab 10/06/12 0500 10/07/12 0555 10/08/12 0500 10/10/12 0500 10/11/12 0515 10/12/12 0530  NA 144 146* 146* 151* 155* 156*  K 4.0 3.3* 3.7 3.1* 2.7* 2.4*  CL 110 110 111 116* 120* 119*  CO2 19 18* 17* 19 24 26   BUN 39* 33* 30* 29* 26* 22  CREATININE 3.44* 3.12* 2.80* 2.29* 2.10* 1.88*  CALCIUM 8.8 8.9 9.0 9.1 9.2 8.9  MG  --   --   --   --   --  1.7  PHOS 5.8* 5.2* 4.8*  --   --   --   GLUCOSE 195* 182* 212* 226* 185* 182*    CBG (last 3)   Recent Labs  10/12/12 0027 10/12/12 0403 10/12/12 0805  GLUCAP 136* 184* 162*     Scheduled Meds: . antiseptic oral rinse  15 mL Mouth Rinse BID  . aspirin  300 mg Rectal BID  . darbepoetin (ARANESP) injection - NON-DIALYSIS  150 mcg Subcutaneous Q Mon-1800  . enoxaparin (LOVENOX) injection  40 mg Subcutaneous Q24H  . feeding supplement  1 Container Oral BID BM  . FLUoxetine  20 mg Oral Daily  . free water  400 mL Per Tube Q8H  . insulin aspart  0-9 Units Subcutaneous Q4H  . insulin detemir  10 Units Subcutaneous Daily  . metoCLOPramide (REGLAN) injection  10 mg Intravenous TID AC & HS  . metoprolol  5 mg Intravenous Q6H  . pantoprazole (PROTONIX) IV  40 mg Intravenous Q24H  . polyethylene glycol  17 g Oral Daily  . potassium chloride  10 mEq Intravenous Q1 Hr x 5  . sodium bicarbonate  1,300 mg Oral TID    Continuous Infusions: . dextrose 125 mL/hr at 10/12/12 1031    Orson Slick RD, LDN Pager [phone] After Hours pager [phone]

## 2012-10-12 NOTE — Progress Notes (Signed)
CRITICAL VALUE ALERT  Critical value received:  Potassium 2.4  Date of notification:  10/12/2012  Time of notification:  0625  Critical value read back:yes  Nurse who received alert:  Earleen Reaper RN  MD notified (1st page):  Baltazar Najjar with [facility]  Time of first page:  [phone]  MD notified (2nd page):  Time of second page:  Responding MD:  Baltazar Najjar with [facility]  Time MD responded:  [phone]

## 2012-10-12 NOTE — Progress Notes (Signed)
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  Post-op complicated by anemia, hypotension, AKI.  Since surgery has had continued emesis, and GES not possible-Ct showed potentital GB sludge and NG tube placed 8/6    Past medical history-As per Problem list Chart reviewed as below- Admission 09/06/12 for R BKA Admission 07/27/12 for DKA, sepsis 2/2 to infected LE's Admission 01/06/07 for CP-Had CABg x 4  Consultants:  Nephrology  GI  Ortho   Procedures:  EGD 10/08/12=neg  L BKA 09/27/12  Antibiotics:  None currently   Subjective  Doing a little better. Tol NG-tube but doesn't;t like it much. No cp/fever/chill/  No stool recently   Objective    Interim History: none  Telemetry: none  Objective: Filed Vitals:   10/11/12 1724 10/11/12 2244 10/12/12 0416 10/12/12 1000  BP: 161/91 172/89 172/95 169/89  Pulse: 84 86 84 79  Temp: 98.4 F (36.9 C) 98.8 F (37.1 C) 98.3 F (36.8 C) 98.8 F (37.1 C)  TempSrc: Oral Oral Oral Oral  Resp: 18 18 18 18   Height:  5' 10.87" (1.8 m)    Weight:  67.3 kg (148 lb 5.9 oz)    SpO2: 98% 94% 97% 98%    Intake/Output Summary (Last 24 hours) at 10/12/12 1305 Last data filed at 10/12/12 1242  Gross per 24 hour  Intake 3346.67 ml  Output   2728 ml  Net 618.67 ml    Exam:  General: alert, flat affect.  EOMI, NCAT Cardiovascular: s1 s2 no m/r/g, RRR Respiratory: clear, no added sound Abdomen: soft, NT/ND Skin no LE edema.  BKA bilaterally Neuro grossly intact  Data Reviewed: Basic Metabolic Panel:  Recent Labs Lab 10/06/12 0500 10/07/12 0555 10/08/12 0500 10/10/12 0500 10/11/12 0515 10/12/12 0530  NA 144 146* 146* 151* 155* 156*  K 4.0 3.3* 3.7 3.1* 2.7* 2.4*  CL 110 110 111 116* 120* 119*  CO2 19 18* 17* 19 24  26   GLUCOSE 195* 182* 212* 226* 185* 182*  BUN 39* 33* 30* 29* 26* 22  CREATININE 3.44* 3.12* 2.80* 2.29* 2.10* 1.88*  CALCIUM 8.8 8.9 9.0 9.1 9.2 8.9  MG  --   --   --   --   --  1.7  PHOS 5.8* 5.2* 4.8*  --   --   --    Liver Function Tests:  Recent Labs Lab 10/06/12 0500 10/07/12 0555 10/08/12 0500 10/12/12 0530  AST  --   --  19 22  ALT  --   --  17 19  ALKPHOS  --   --  214* 177*  BILITOT  --   --  1.2 1.6*  PROT  --   --  7.8 7.5  ALBUMIN 1.6* 1.6* 1.8* 1.7*   No results found for this basename: LIPASE, AMYLASE,  in the last 168 hours No results found for this basename: AMMONIA,  in the last 168 hours CBC:  Recent Labs Lab 10/08/12 0500 10/10/12 0500 10/11/12 0515 10/12/12 0530  WBC 12.5* 15.8* 15.3* 13.7*  NEUTROABS  --   --   --  10.7*  HGB 7.1* 7.6* 8.1* 8.4*  HCT 21.2* 23.1* 24.6* 25.5*  MCV 82.5 83.4 82.6 82.5  PLT 491* 510* 490* 447*   Cardiac Enzymes: No results  found for this basename: CKTOTAL, CKMB, CKMBINDEX, TROPONINI,  in the last 168 hours BNP: No components found with this basename: POCBNP,  CBG:  Recent Labs Lab 10/11/12 2241 10/12/12 0027 10/12/12 0403 10/12/12 0805 10/12/12 1106  GLUCAP 117* 136* 184* 162* 150*    No results found for this or any previous visit (from the past 240 hour(s)).   Studies:              All Imaging reviewed and is as per above notation   Scheduled Meds: . antiseptic oral rinse  15 mL Mouth Rinse BID  . aspirin  300 mg Rectal BID  . darbepoetin (ARANESP) injection - NON-DIALYSIS  150 mcg Subcutaneous Q Mon-1800  . enoxaparin (LOVENOX) injection  40 mg Subcutaneous Q24H  . feeding supplement  1 Container Oral BID BM  . FLUoxetine  20 mg Oral Daily  . free water  400 mL Per Tube Q8H  . insulin aspart  0-9 Units Subcutaneous Q4H  . insulin detemir  10 Units Subcutaneous Daily  . metoCLOPramide (REGLAN) injection  10 mg Intravenous TID AC & HS  . metoprolol  5 mg Intravenous Q6H  . pantoprazole  (PROTONIX) IV  40 mg Intravenous Q24H  . polyethylene glycol  17 g Oral Daily  . potassium chloride  10 mEq Intravenous Q1 Hr x 5  . sodium bicarbonate  1,300 mg Oral TID   Continuous Infusions: . dextrose 125 mL/hr at 10/12/12 1031     Assessment/Plan: 1. 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 N/V-unclear source.  NGT in place.  Note HIDA scan ordered.  Potentially will feed subsequently 3. Hypernatremia-Water deficit=4.2 liters.  Correct with IV dextrose over the next 30 hours at rate of 0.5 Meq/L/hr.  Bmet am 4. Leukocytosis-unclear etiology-monitor for fevers 5. Hypokalemia-replacing IV with 40 meq.  Monitor in am 6. Severe malnutrition-Await decision Re: Panda tube.  Start free water.  After HIDAinitiate Vital 1.5 @ 20 ml/hr and advance by 10 ml q 4 hr to a goal of 60 ml/hr per dietician 7. Cough? Aspiration-no overt Pna.  Monitor? 8. CHF-on IVF only as cannot take PO.  Monitor 9. DM ty 2-Blood sugars 157-186-continue Levemir 10 Units daily and sensitive Scale coverage-sugars 130-180 and controllede 10. Anemia-ABLA + nutritional-Monitor. Continue Aranesp. 11. AKI-stabilising-creat trending downwards.  Cont IVF 12. Yeat UTI-Rx with diflucan 13. Elevated alk phos, Bili-? 2/2 to Antifungals.  Rpt in 1-2 days 14. H/o CVA-continue ASA 325 daily 15. Depression-consider psych consult-monitor 16. Htn-continue metoprolol 5 mg IV q6h    Code Status: full Family Communication:  None at bedside Disposition Plan: ?-likely SNF   Verneita Griffes, MD  [facility] Pager [phone] 10/12/2012, 1:05 PM    LOS: 16 days

## 2012-10-12 NOTE — Clinical Social Work Note (Addendum)
CSW was advised that patient wanted his friend to go to facility to get his debit card and needed to see CSW. Visit made to room and before he was taken to a procedure, Mr. Dolan requested that CSW contact [facility] to have them give his friend Rise Mu [phone]) his debit card. Per Ms. Whitsett the patient needs some bills paid and something has been cut-off. CSW contacted [employer] and spoke with admissions and Adelina Mings in the business office and they will need a letter signed by Mr. Grime making the request, as he had instructed them to not give his personal belongings to anyone.  Letter completed and will be given to patient to sign on 7/8 and then given to Ms. Whitsett to take to [facility].  Emslee Lopezmartinez Givens, MSW, LCSW [phone]

## 2012-10-12 NOTE — Progress Notes (Signed)
Subjective: Nausea somewhat improved after NGT placement.  Objective: Vital signs in last 24 hours: Temp:  [98.2 F (36.8 C)-98.8 F (37.1 C)] 98.3 F (36.8 C) (08/07 0416) Pulse Rate:  [84-88] 84 (08/07 0416) Resp:  [18] 18 (08/07 0416) BP: (161-172)/(77-95) 172/95 mmHg (08/07 0416) SpO2:  [94 %-98 %] 97 % (08/07 0416) Weight:  [67.3 kg (148 lb 5.9 oz)] 67.3 kg (148 lb 5.9 oz) (08/06 2244) Weight change: 0.3 kg (10.6 oz) Last BM Date: 10/05/12  PE: GEN:  Depressed-appearing, NAD ABD:  Soft  Lab Results: CBC    Component Value Date/Time   WBC 13.7* 10/12/2012 0530   RBC 3.09* 10/12/2012 0530   RBC 3.22* 09/27/2012 1615   HGB 8.4* 10/12/2012 0530   HCT 25.5* 10/12/2012 0530   PLT 447* 10/12/2012 0530   MCV 82.5 10/12/2012 0530   MCH 27.2 10/12/2012 0530   MCHC 32.9 10/12/2012 0530   RDW 19.9* 10/12/2012 0530   LYMPHSABS 1.9 10/12/2012 0530   MONOABS 1.0 10/12/2012 0530   EOSABS 0.1 10/12/2012 0530   BASOSABS 0.0 10/12/2012 0530   CMP     Component Value Date/Time   NA 156* 10/12/2012 0530   K 2.4* 10/12/2012 0530   CL 119* 10/12/2012 0530   CO2 26 10/12/2012 0530   GLUCOSE 182* 10/12/2012 0530   BUN 22 10/12/2012 0530   CREATININE 1.88* 10/12/2012 0530   CALCIUM 8.9 10/12/2012 0530   PROT 7.5 10/12/2012 0530   ALBUMIN 1.7* 10/12/2012 0530   AST 22 10/12/2012 0530   ALT 19 10/12/2012 0530   ALKPHOS 177* 10/12/2012 0530   BILITOT 1.6* 10/12/2012 0530   GFRNONAA 37* 10/12/2012 0530   GFRAA 43* 10/12/2012 0530   Studies/Results: Ct Abdomen Pelvis Wo Contrast  10/11/2012   *RADIOLOGY REPORT*  Clinical Data: Nausea/vomiting, evaluate bowel obstruction  CT ABDOMEN AND PELVIS WITHOUT CONTRAST  Technique:  Multidetector CT imaging of the abdomen and pelvis was performed following the standard protocol without intravenous contrast.  Comparison: None.  Findings: Evaluation is obscured by streak artifact from the patient's arms.  Small bilateral pleural effusions, right greater than left. Associated lower lobe opacities,  likely atelectasis.  Enteric tube terminates in the distal gastric body.  Unenhanced liver, spleen, pancreas, and left adrenal gland are within normal limits.  2.0 cm right adrenal nodule (series 2/image 24), poorly evaluated.  Gallbladder is mildly distended with suspected sludge/noncalcified gallstones.  No intrahepatic or extrahepatic ductal dilatation.  Kidneys are unremarkable.  No renal calculi or hydronephrosis.  No evidence of bowel obstruction.  Normal appendix.  Atherosclerotic calcifications of the abdominal aorta and branch vessels.  No abdominopelvic ascites.  No gross abdominopelvic lymphadenopathy.  Prostate is unremarkable.  Bladder is within normal limits.  Body wall edema.  Subcutaneous gas in the left flank likely reflects injection site (series 2/image 39).  Degenerative changes of the visualized thoracolumbar spine.  IMPRESSION: No evidence of bowel obstruction.  Normal appendix.  Distended gallbladder with suspected sludge/noncalcified gallstones.  2.0 cm right adrenal nodule, statistically likely reflecting a benign adenoma, although poorly evaluated.  Consider follow-up MRI abdomen without contrast as an outpatient for further characterization.  Small bilateral pleural effusions with body wall edema.   Original Report Authenticated By: Julian Hy, M.D.   Assessment:  1.  Nausea and vomiting.  No obstruction seen on CT yesterday.  Gallbladder is consideration, though he doesn't have abdominal pain.  GI tract dysmotility is still a consideration as well. 2.  Malnutrition, failure-to-thrive.  Plan:  1.  HIDA scan to evaluate for cystic duct obstruction, given distended gallbladder with nausea/vomiting. 2.  Once HIDA is done, will need nasojejunal feeding tube placement for temporary nutrition.  Hospitalist can feel free to order this after his HIDA is done, otherwise I will order tomorrow, if HIDA has been completed. 3.  Will follow.   Landry Dyke 10/12/2012, 9:06 AM

## 2012-10-13 LAB — RENAL FUNCTION PANEL
CO2: 27 mEq/L (ref 19–32)
Calcium: 8.7 mg/dL (ref 8.4–10.5)
Chloride: 117 mEq/L — ABNORMAL HIGH (ref 96–112)
GFR calc Af Amer: 49 mL/min — ABNORMAL LOW (ref >90)
GFR calc non Af Amer: 43 mL/min — ABNORMAL LOW (ref >90)
Sodium: 152 mEq/L — ABNORMAL HIGH (ref 135–145)

## 2012-10-13 LAB — GLUCOSE, CAPILLARY
Glucose-Capillary: 153 mg/dL — ABNORMAL HIGH (ref 70–99)
Glucose-Capillary: 127 mg/dL — ABNORMAL HIGH (ref 70–99)
Glucose-Capillary: 115 mg/dL — ABNORMAL HIGH (ref 70–99)

## 2012-10-13 NOTE — Progress Notes (Signed)
Physical Therapy Treatment Patient Details Name: [name] MRN: [id number] DOB: 06-08-1950 Today's Date: 10/13/2012 Time: [id number] PT Time Calculation (min): 14 min  PT Assessment / Plan / Recommendation  History of Present Illness pt presents with Bil BKA.     PT Comments   Pt with no verbalizations today, but does nod head yes/no appropriately.  Notified RN and Nsg Tech that pt was up in recliner and how much A he is able to provide to perform lateral scoot.    Follow Up Recommendations  SNF     Does the patient have the potential to tolerate intense rehabilitation     Barriers to Discharge        Equipment Recommendations  None recommended by PT    Recommendations for Other Services    Frequency Min 2X/week   Progress towards PT Goals Progress towards PT goals: Progressing toward goals  Plan Frequency needs to be updated    Precautions / Restrictions Precautions Precautions: Fall Restrictions Weight Bearing Restrictions: No Other Position/Activity Restrictions: bil BKA   Pertinent Vitals/Pain Denies pain.    Mobility  Bed Mobility Bed Mobility: Supine to Sit Supine to Sit: 4: Min assist Details for Bed Mobility Assistance: pt needed MinA to bring trunk up to long sit.   Transfers Transfers: Lateral/Scoot Transfers Lateral/Scoot Transfers: 4: Min assist;With armrests removed Details for Transfer Assistance: pt requires use of pad and cueing for use of UEs to laterally scoot to drop arm recliner.   Ambulation/Gait Ambulation/Gait Assistance: Not tested (comment) Stairs: No Wheelchair Mobility Wheelchair Mobility: No    Exercises     PT Diagnosis:    PT Problem List:   PT Treatment Interventions:     PT Goals (current goals can now be found in the care plan section) Acute Rehab PT Goals PT Goal Formulation: With patient Time For Goal Achievement: 10/27/12 Potential to Achieve Goals: Good  Visit Information  Last PT Received On: 10/13/12 Assistance  Needed: +1 History of Present Illness: pt presents with Bil BKA.      Subjective Data  Subjective: pt nods head in agreement.     Cognition  Cognition Arousal/Alertness: Awake/alert Behavior During Therapy: Flat affect Overall Cognitive Status: Within Functional Limits for tasks assessed    Balance  Balance Balance Assessed: Yes Dynamic Sitting Balance Dynamic Sitting - Balance Support: Bilateral upper extremity supported;Feet unsupported;During functional activity Dynamic Sitting - Level of Assistance: 5: Stand by assistance Dynamic Sitting - Comments: pt demos good balance while scooting  End of Session PT - End of Session Activity Tolerance: Patient tolerated treatment well Patient left: in chair;with call bell/phone within reach;with family/visitor present Nurse Communication: Mobility status   GP     Catarina Hartshorn, [place] 10/13/2012, 2:51 PM

## 2012-10-13 NOTE — Progress Notes (Signed)
NUTRITION FOLLOW UP  Intervention:   1. Once pt has appropriate tube feeding access, initiate Vital 1.5 @ 20 ml/hr and advance by 10 ml q 4 hr to a goal of 60 ml/hr. This will provide 2160 kcal, 97 gm protein, and 1100 ml free water daily.   Nutrition Dx:   Inadequate oral intake related to nausea and vomiting as evidenced by pt report. Ongoing  Goal:   Pt will meet >/=90% estimated nutrition needs. Unmet  Monitor:   PO intake, weight trends, labs   Assessment:   Discussed with RN at rounds. Pt continues with NG tube, has been clamped. Did not tolerate water flushes well per RN. Planned to start TF, recommend change to jejunal access for better tolerance prior to starting TF. RN agrees. MD reports possible PEJ placement? GI to see and address feeding route.   Height: Ht Readings from Last 1 Encounters:  10/11/12 5' 10.87" (1.8 m)    Weight Status:   Wt Readings from Last 1 Encounters:  10/12/12 148 lb 5.9 oz (67.3 kg)  decreasing from weight of 166 lbs on 7/25.   Re-estimated needs:  Kcal: 2050-2200  Protein: 90 - 110 g  Fluid: 2 liters daily  Skin: incisions in L thigh and R leg   Diet Order:   NPO    Intake/Output Summary (Last 24 hours) at 10/13/12 0953 Last data filed at 10/13/12 0900  Gross per 24 hour  Intake   4020 ml  Output   2953 ml  Net   1067 ml    Last BM: 7/28   Labs:   Recent Labs Lab 10/07/12 0555 10/08/12 0500 10/10/12 0500 10/11/12 0515 10/12/12 0530 10/13/12 0556  NA 146* 146* 151* 155* 156*  --   K 3.3* 3.7 3.1* 2.7* 2.4* 3.1*  CL 110 111 116* 120* 119*  --   CO2 18* 17* 19 24 26   --   BUN 33* 30* 29* 26* 22  --   CREATININE 3.12* 2.80* 2.29* 2.10* 1.88*  --   CALCIUM 8.9 9.0 9.1 9.2 8.9  --   MG  --   --   --   --  1.7  --   PHOS 5.2* 4.8*  --   --   --   --   GLUCOSE 182* 212* 226* 185* 182*  --     CBG (last 3)   Recent Labs  10/12/12 2021 10/13/12 0402 10/13/12 0846  GLUCAP 109* 153* 127*    Scheduled Meds: .  antiseptic oral rinse  15 mL Mouth Rinse BID  . aspirin  300 mg Rectal BID  . darbepoetin (ARANESP) injection - NON-DIALYSIS  150 mcg Subcutaneous Q Mon-1800  . enoxaparin (LOVENOX) injection  40 mg Subcutaneous Q24H  . feeding supplement  1 Container Oral BID BM  . FLUoxetine  20 mg Oral Daily  . free water  400 mL Per Tube Q8H  . insulin aspart  0-9 Units Subcutaneous Q4H  . insulin detemir  10 Units Subcutaneous Daily  . metoCLOPramide (REGLAN) injection  10 mg Intravenous TID AC & HS  . metoprolol  5 mg Intravenous Q6H  . pantoprazole (PROTONIX) IV  40 mg Intravenous Q24H  . polyethylene glycol  17 g Oral Daily    Continuous Infusions: . dextrose 125 mL/hr at 10/13/12 0411    Orson Slick RD, LDN Pager [phone] After Hours pager [phone]

## 2012-10-13 NOTE — Progress Notes (Signed)
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  Post-op complicated by anemia, hypotension, AKI.  Since surgery has had continued emesis, and GES not possible-Ct showed potentital GB sludge and NG tube placed 8/6    Past medical history-As per Problem list Chart reviewed as below- Admission 09/06/12 for R BKA Admission 07/27/12 for DKA, sepsis 2/2 to infected LE's Admission 01/06/07 for CP-Had CABg x 4  Consultants:  Nephrology  GI  Ortho   Procedures:  EGD 10/08/12=neg  L BKA 09/27/12  Antibiotics:  None currently   Subjective  Doing fair only.  Cannot tolerate po as vomits.  vomited free water.  Denies fever chills.  Still very flat affect   Objective    Interim History: none  Telemetry: none  Objective: Filed Vitals:   10/12/12 2019 10/12/12 2346 10/13/12 0411 10/13/12 0933  BP: 141/74 144/76 143/70 158/70  Pulse: 79 83 76 77  Temp: 98.3 F (36.8 C)  98.5 F (36.9 C) 97.6 F (36.4 C)  TempSrc: Oral  Oral Oral  Resp: 18  18 20   Height:      Weight: 67.3 kg (148 lb 5.9 oz)     SpO2: 98%  100% 99%    Intake/Output Summary (Last 24 hours) at 10/13/12 1231 Last data filed at 10/13/12 0900  Gross per 24 hour  Intake   3760 ml  Output   2503 ml  Net   1257 ml    Exam:  General: alert, flat affect.  EOMI, NCAT Cardiovascular: s1 s2 no m/r/g, RRR Respiratory: clear, no added sound Abdomen: soft, NT/ND Skin no LE edema.  BKA bilaterally Neuro grossly intact  Data Reviewed: Basic Metabolic Panel:  Recent Labs Lab 10/07/12 0555 10/08/12 0500 10/10/12 0500 10/11/12 0515 10/12/12 0530 10/13/12 0556  NA 146* 146* 151* 155* 156*  --   K 3.3* 3.7 3.1* 2.7* 2.4* 3.1*  CL 110 111 116* 120* 119*  --   CO2 18* 17* 19 24 26   --   GLUCOSE 182* 212*  226* 185* 182*  --   BUN 33* 30* 29* 26* 22  --   CREATININE 3.12* 2.80* 2.29* 2.10* 1.88*  --   CALCIUM 8.9 9.0 9.1 9.2 8.9  --   MG  --   --   --   --  1.7  --   PHOS 5.2* 4.8*  --   --   --   --    Liver Function Tests:  Recent Labs Lab 10/07/12 0555 10/08/12 0500 10/12/12 0530  AST  --  19 22  ALT  --  17 19  ALKPHOS  --  214* 177*  BILITOT  --  1.2 1.6*  PROT  --  7.8 7.5  ALBUMIN 1.6* 1.8* 1.7*   No results found for this basename: LIPASE, AMYLASE,  in the last 168 hours No results found for this basename: AMMONIA,  in the last 168 hours CBC:  Recent Labs Lab 10/08/12 0500 10/10/12 0500 10/11/12 0515 10/12/12 0530  WBC 12.5* 15.8* 15.3* 13.7*  NEUTROABS  --   --   --  10.7*  HGB 7.1* 7.6* 8.1* 8.4*  HCT 21.2* 23.1* 24.6* 25.5*  MCV 82.5 83.4 82.6 82.5  PLT 491* 510* 490* 447*   Cardiac Enzymes: No  results found for this basename: CKTOTAL, CKMB, CKMBINDEX, TROPONINI,  in the last 168 hours BNP: No components found with this basename: POCBNP,  CBG:  Recent Labs Lab 10/12/12 1106 10/12/12 1734 10/12/12 2021 10/13/12 0402 10/13/12 0846  GLUCAP 150* 144* 109* 153* 127*    No results found for this or any previous visit (from the past 240 hour(s)).   Studies:              All Imaging reviewed and is as per above notation   Scheduled Meds: . antiseptic oral rinse  15 mL Mouth Rinse BID  . aspirin  300 mg Rectal BID  . darbepoetin (ARANESP) injection - NON-DIALYSIS  150 mcg Subcutaneous Q Mon-1800  . enoxaparin (LOVENOX) injection  40 mg Subcutaneous Q24H  . feeding supplement  1 Container Oral BID BM  . FLUoxetine  20 mg Oral Daily  . free water  400 mL Per Tube Q8H  . insulin aspart  0-9 Units Subcutaneous Q4H  . insulin detemir  10 Units Subcutaneous Daily  . metoCLOPramide (REGLAN) injection  10 mg Intravenous TID AC & HS  . metoprolol  5 mg Intravenous Q6H  . pantoprazole (PROTONIX) IV  40 mg Intravenous Q24H  . polyethylene glycol  17 g Oral  Daily   Continuous Infusions: . dextrose 125 mL/hr at 10/13/12 0411     Assessment/Plan: 1. 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 N/V-unclear source.  NGT in place.  HIDA neg.  GES canont be done sop close to HIDA-patient to stay over weekend for GES-Dr. Penelope Coop aware 3. Hypernatremia-Water deficit=4.2 liters.  Correct with IV dextrose over the next 30 hours at rate of 0.5 Meq/L/hr.  Bmet am 4. Leukocytosis-unclear etiology-monitor for fevers 5. Hypokalemia-replacing IV with 40 meq.  Monitor in am 6. Severe malnutrition-Await decision Re: Panda tube.  Start free water.  Hold feeds as still vomiting 7. Cough? Aspiration-no overt Pna.  Monitor? 8. CHF-on IVF only as cannot take PO.  Monitor 9. DM ty 2-Blood sugars 157-186-continue Levemir 10 Units daily and sensitive Scale coverage-sugars 130-180 and controllede 10. Anemia-ABLA + nutritional-Monitor. Continue Aranesp. 11. AKI-stabilising-creat trending downwards.  Cont IVF 12. Yeat UTI-Rx with diflucan 13. Elevated alk phos, Bili-? 2/2 to Antifungals.  Rpt in 1-2 days 14. H/o CVA-continue ASA 325 daily 15. Depression-consider psych consult-monitor 16. Htn-continue metoprolol 5 mg IV q6h    Code Status: full Family Communication:  None at bedside Disposition Plan: ?-likely SNF   Verneita Griffes, MD  [facility] Pager [phone] 10/13/2012, 12:31 PM    LOS: 17 days

## 2012-10-13 NOTE — Progress Notes (Signed)
Utilization review completed.  

## 2012-10-13 NOTE — Progress Notes (Signed)
The patient was seen in regards to ongoing problems with nausea and vomiting. There is no definite etiology but it is suspected that he has diabetic gastroparesis as the cause of this. He has been unable to go through with gastric emptying study as he told me that he didn't like the smell of the eggs and just couldn't eat it. Nothing showed up on EGD to explain this. He has had an NG tube in for a few days for decompression.  He's feeling better now the NG tube has been clamped. I would like to try to get the gastric emptying study done to see if we can figure out if he does have gastroparesis. I talked to him about this and he will try to do this study again.

## 2012-10-14 LAB — GLUCOSE, CAPILLARY
Glucose-Capillary: 153 mg/dL — ABNORMAL HIGH (ref 70–99)
Glucose-Capillary: 118 mg/dL — ABNORMAL HIGH (ref 70–99)

## 2012-10-14 MED ORDER — POTASSIUM CHLORIDE CRYS ER 20 MEQ PO TBCR
40.0000 meq | EXTENDED_RELEASE_TABLET | Freq: Four times a day (QID) | ORAL | Status: AC
  Administered 2012-10-14 (×2): 40 meq via ORAL
  Filled 2012-10-14 (×2): qty 2

## 2012-10-14 NOTE — Progress Notes (Signed)
[facility] PROGRESS NOTE  [name] [id number] DOB: 03/14/1950 DOA: 09/26/2012 PCP: No PCP Per Patient   HPI/Subjective: Less nausea and vomiting,. Per GI discontinue the feeding tube and start on clear liquids.  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 NGT in place. HIDA neg. GES canont be done sop close to HIDA-patient to stay over weekend for GES-Dr. Penelope Coop aware   Hypernatremia Water deficit=4.2 liters. Correct with IV dextrose over the next 30 hours at rate of 0.5 Meq/L/hr. Bmet am   Leukocytosis-unclear etiology-monitor for fevers.  Severe malnutrition-Await decision Re: Panda tube. Start free water. Hold feeds as still vomiting  Cough? Aspiration-no overt Pna. Monitor  CHF-on IVF only as cannot take PO. Monitor   DM type 2-Blood sugars 157-186-continue Levemir 10 Units daily and sensitive Scale coverage-sugars 130-180 and controllede   Anemia-ABLA + nutritional-Monitor. Continue Aranesp.   AKI-stabilising-creat trending downwards. Cont IVF   Yeast UTI-Rx with diflucan Elevated alk phos, Bili-? 2/2 to Antifungals. Rpt in 1-2 days   H/o CVA-continue ASA 325 daily Depression-consider psych consult-monitor Htn-continue metoprolol 5 mg IV q6h   Code Status: Full code Family Communication: Plan discussed with patient Disposition Plan: Remain inpatient   Consultants:  GI  Procedures:  None  Antibiotics:  None   Objective: Filed Vitals:   10/14/12 0815  BP: 123/75  Pulse: 81  Temp: 97.2 F (36.2 C)  Resp: 20    Intake/Output Summary (Last 24 hours) at 10/14/12 1439 Last data filed at 10/14/12 1100  Gross per 24 hour  Intake 1445.83 ml  Output    950 ml  Net 495.83 ml   Filed Weights   10/11/12 2244 10/12/12 2019 10/13/12 2006  Weight: 67.3 kg (148 lb 5.9 oz) 67.3 kg (148 lb 5.9 oz) 67.3 kg (148 lb 5.9 oz)    Exam:  General: Alert and awake, oriented x3, not in any acute distress. HEENT:  anicteric sclera, pupils reactive to light and accommodation, EOMI CVS: S1-S2 clear, no murmur rubs or gallops Chest: clear to auscultation bilaterally, no wheezing, rales or rhonchi Abdomen: soft nontender, nondistended, normal bowel sounds, no organomegaly Extremities: Bilateral BKA Neuro: Cranial nerves II-XII intact, no focal neurological deficits  Data Reviewed: Basic Metabolic Panel:  Recent Labs Lab 10/08/12 0500 10/10/12 0500 10/11/12 0515 10/12/12 0530 10/13/12 0556 10/13/12 1155  NA 146* 151* 155* 156*  --  152*  K 3.7 3.1* 2.7* 2.4* 3.1* 3.0*  CL 111 116* 120* 119*  --  117*  CO2 17* 19 24 26   --  27  GLUCOSE 212* 226* 185* 182*  --  183*  BUN 30* 29* 26* 22  --  18  CREATININE 2.80* 2.29* 2.10* 1.88*  --  1.66*  CALCIUM 9.0 9.1 9.2 8.9  --  8.7  MG  --   --   --  1.7  --   --   PHOS 4.8*  --   --   --   --  2.4   Liver Function Tests:  Recent Labs Lab 10/08/12 0500 10/12/12 0530 10/13/12 1155  AST 19 22  --   ALT 17 19  --   ALKPHOS 214* 177*  --   BILITOT 1.2 1.6*  --   PROT 7.8 7.5  --   ALBUMIN 1.8* 1.7* 1.7*   No results found for this basename: LIPASE, AMYLASE,  in the last 168 hours No results found for this basename: AMMONIA,  in the last 168 hours CBC:  Recent  Labs Lab 10/08/12 0500 10/10/12 0500 10/11/12 0515 10/12/12 0530  WBC 12.5* 15.8* 15.3* 13.7*  NEUTROABS  --   --   --  10.7*  HGB 7.1* 7.6* 8.1* 8.4*  HCT 21.2* 23.1* 24.6* 25.5*  MCV 82.5 83.4 82.6 82.5  PLT 491* 510* 490* 447*   Cardiac Enzymes: No results found for this basename: CKTOTAL, CKMB, CKMBINDEX, TROPONINI,  in the last 168 hours BNP (last 3 results) No results found for this basename: PROBNP,  in the last 8760 hours CBG:  Recent Labs Lab 10/13/12 2010 10/13/12 2355 10/14/12 0422 10/14/12 0746 10/14/12 1217  GLUCAP 115* 108* 153* 118* 167*    No results found for this or any previous visit (from the past 240 hour(s)).   Studies: Nm Hepatobiliary  Liver Func  10/12/2012   *RADIOLOGY REPORT*  Clinical Data:  Abdominal pain and nausea vomiting.  Gallbladder distended with suspected gallstones/sludge on a current CT.  NUCLEAR MEDICINE HEPATOBILIARY IMAGING  Technique:  Sequential images of the abdomen were obtained out to 60 minutes following intravenous administration of radiopharmaceutical. 3 mg of morphine intravenously was given at 80 minutes following the injection of radiotracer to promote gallbladder visualization.  Radiopharmaceutical:  64mCi Tc-33m Choletec  Comparison:  CT, 10/11/2012  Findings: The liver shows prompt and homogeneous accumulation of radiotracer.  No liver defect is seen.  There is prompt excretion into the intra and extrahepatic biliary tree with small bowel activity seen early during the exam indicating patency of the common bile duct.  The gallbladder was not seen on initial imaging.  However, following morphine, gallbladder activity was documented indicating patency of the cystic duct.  IMPRESSION: No evidence of acute cholecystitis.  Gallbladder filling was sluggish, necessitating intravenous morphine administration, but the cystic duct is patent.  Normal appearance of the liver.  Patent common bile duct.   Original Report Authenticated By: Lajean Manes, M.D.    Scheduled Meds: . antiseptic oral rinse  15 mL Mouth Rinse BID  . aspirin  300 mg Rectal BID  . darbepoetin (ARANESP) injection - NON-DIALYSIS  150 mcg Subcutaneous Q Mon-1800  . enoxaparin (LOVENOX) injection  40 mg Subcutaneous Q24H  . feeding supplement  1 Container Oral BID BM  . FLUoxetine  20 mg Oral Daily  . free water  400 mL Per Tube Q8H  . insulin aspart  0-9 Units Subcutaneous Q4H  . insulin detemir  10 Units Subcutaneous Daily  . metoCLOPramide (REGLAN) injection  10 mg Intravenous TID AC & HS  . metoprolol  5 mg Intravenous Q6H  . pantoprazole (PROTONIX) IV  40 mg Intravenous Q24H  . polyethylene glycol  17 g Oral Daily   Continuous  Infusions: . dextrose 125 mL/hr at 10/13/12 2158    Principal Problem:   Gangrene of foot Left Active Problems:   H/O: CVA (cerebrovascular accident)   Hypertension   Anemia   DM (diabetes mellitus) type 2, uncontrolled, with ketoacidosis   Nausea with vomiting   Fever   UTI (lower urinary tract infection)   Wound eschar of foot   Major depressive disorder, recurrent episode, moderate    Time spent: 35 minutes    [facility] Pager [phone]. If 7PM-7AM, please contact night-coverage at [email or web address], password [facility] 10/14/2012, 2:39 PM  LOS: 18 days

## 2012-10-14 NOTE — Progress Notes (Signed)
No nausea today. Tolerating clear flushes of fluid through NG tube. Will DC NG tube and try clear liquids. Gastric emptying study Monday.

## 2012-10-15 LAB — CBC
HCT: 26.9 % — ABNORMAL LOW (ref 39.0–52.0)
MCH: 27.2 pg (ref 26.0–34.0)
MCHC: 33.1 g/dL (ref 30.0–36.0)
MCV: 82.3 fL (ref 78.0–100.0)
RDW: 20.4 % — ABNORMAL HIGH (ref 11.5–15.5)

## 2012-10-15 LAB — BASIC METABOLIC PANEL
BUN: 19 mg/dL (ref 6–23)
Chloride: 108 mEq/L (ref 96–112)
Creatinine, Ser: 1.7 mg/dL — ABNORMAL HIGH (ref 0.50–1.35)
GFR calc Af Amer: 48 mL/min — ABNORMAL LOW (ref >90)
Glucose, Bld: 90 mg/dL (ref 70–99)

## 2012-10-15 LAB — URINE MICROSCOPIC-ADD ON

## 2012-10-15 LAB — GLUCOSE, CAPILLARY
Glucose-Capillary: 85 mg/dL (ref 70–99)
Glucose-Capillary: 80 mg/dL (ref 70–99)
Glucose-Capillary: 126 mg/dL — ABNORMAL HIGH (ref 70–99)
Glucose-Capillary: 78 mg/dL (ref 70–99)

## 2012-10-15 LAB — URINALYSIS, ROUTINE W REFLEX MICROSCOPIC
Glucose, UA: NEGATIVE mg/dL
Protein, ur: NEGATIVE mg/dL
Specific Gravity, Urine: 1.004 — ABNORMAL LOW (ref 1.005–1.030)
pH: 6 (ref 5.0–8.0)

## 2012-10-15 MED ORDER — INSULIN ASPART 100 UNIT/ML ~~LOC~~ SOLN
0.0000 [IU] | Freq: Three times a day (TID) | SUBCUTANEOUS | Status: DC
  Administered 2012-10-15: 1 [IU] via SUBCUTANEOUS
  Administered 2012-10-16: 2 [IU] via SUBCUTANEOUS
  Administered 2012-10-16: 1 [IU] via SUBCUTANEOUS
  Administered 2012-10-17: 2 [IU] via SUBCUTANEOUS
  Administered 2012-10-17 – 2012-10-19 (×3): 1 [IU] via SUBCUTANEOUS
  Administered 2012-10-19 (×2): 2 [IU] via SUBCUTANEOUS
  Administered 2012-10-20: 1 [IU] via SUBCUTANEOUS

## 2012-10-15 MED ORDER — MAGNESIUM SULFATE 40 MG/ML IJ SOLN
2.0000 g | Freq: Once | INTRAMUSCULAR | Status: AC
  Administered 2012-10-15: 2 g via INTRAVENOUS
  Filled 2012-10-15: qty 50

## 2012-10-15 MED ORDER — POTASSIUM CHLORIDE CRYS ER 20 MEQ PO TBCR
60.0000 meq | EXTENDED_RELEASE_TABLET | Freq: Four times a day (QID) | ORAL | Status: AC
  Administered 2012-10-15 (×2): 60 meq via ORAL
  Filled 2012-10-15: qty 3
  Filled 2012-10-15: qty 1
  Filled 2012-10-15: qty 2

## 2012-10-15 MED ORDER — DEXTROSE-NACL 5-0.45 % IV SOLN
INTRAVENOUS | Status: DC
  Administered 2012-10-15: 13:00:00 via INTRAVENOUS
  Administered 2012-10-16: 1000 mL via INTRAVENOUS
  Administered 2012-10-16 – 2012-10-18 (×5): via INTRAVENOUS

## 2012-10-15 NOTE — Progress Notes (Signed)
Denies nausea or vomiting since NG taken out. For gastric emptying study tomorrow.

## 2012-10-15 NOTE — Progress Notes (Signed)
[facility] PROGRESS NOTE  [name] [id number] DOB: March 02, 1951 DOA: 09/26/2012 PCP: No PCP Per Patient   HPI/Subjective: Reported that he vomited once yesterday, NG tube discontinued yesterday. Patient is on clear liquids seems he tolerates.  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 NGT in place. HIDA neg. GES canont be done sop close to HIDA-patient to stay over weekend for GES-Dr. Penelope Coop aware   Hypernatremia -Likely secondary to free water deficit which estimated to be 4.2 L. -Patient is on D5W, will change IV fluid to D5W half-normal saline.  Hypokalemia -Replete orally. Check BMP in a.m.  Leukocytosis-unclear etiology-monitor for fevers.  Severe malnutrition-Await decision Re: Panda tube. Start free water. Hold feeds as still vomiting  Cough? Aspiration-no overt Pna. Monitor  CHF-on IVF only as cannot take PO. Monitor   DM type 2-Blood sugars 157-186-continue Levemir 10 Units daily and sensitive Scale coverage-sugars 130-180 and controllede   Anemia-ABLA + nutritional-Monitor. Continue Aranesp.   AKI-stabilising-creat trending downwards. Cont IVF   Yeast UTI-Rx with diflucan Elevated alk phos, Bili-? 2/2 to Antifungals. Rpt in 1-2 days   H/o CVA-continue ASA 325 daily Depression-consider psych consult-monitor Htn-continue metoprolol 5 mg IV q6h   Code Status: Full code Family Communication: Plan discussed with patient Disposition Plan: Remain inpatient   Consultants:  GI  Procedures:  None  Antibiotics:  None   Objective: Filed Vitals:   10/15/12 0745  BP: 134/72  Pulse: 77  Temp: 98.5 F (36.9 C)  Resp: 20    Intake/Output Summary (Last 24 hours) at 10/15/12 1214 Last data filed at 10/15/12 0730  Gross per 24 hour  Intake    480 ml  Output    501 ml  Net    -21 ml   Filed Weights   10/12/12 2019 10/13/12 2006 10/14/12 2054  Weight: 67.3 kg (148 lb 5.9 oz) 67.3 kg (148 lb 5.9 oz) 64  kg (141 lb 1.5 oz)    Exam:  General: Alert and awake, oriented x3, not in any acute distress. HEENT: anicteric sclera, pupils reactive to light and accommodation, EOMI CVS: S1-S2 clear, no murmur rubs or gallops Chest: clear to auscultation bilaterally, no wheezing, rales or rhonchi Abdomen: soft nontender, nondistended, normal bowel sounds, no organomegaly Extremities: Bilateral BKA Neuro: Cranial nerves II-XII intact, no focal neurological deficits  Data Reviewed: Basic Metabolic Panel:  Recent Labs Lab 10/10/12 0500 10/11/12 0515 10/12/12 0530 10/13/12 0556 10/13/12 1155 10/15/12 0500  NA 151* 155* 156*  --  152* 143  K 3.1* 2.7* 2.4* 3.1* 3.0* 2.8*  CL 116* 120* 119*  --  117* 108  CO2 19 24 26   --  27 25  GLUCOSE 226* 185* 182*  --  183* 90  BUN 29* 26* 22  --  18 19  CREATININE 2.29* 2.10* 1.88*  --  1.66* 1.70*  CALCIUM 9.1 9.2 8.9  --  8.7 8.4  MG  --   --  1.7  --   --   --   PHOS  --   --   --   --  2.4  --    Liver Function Tests:  Recent Labs Lab 10/12/12 0530 10/13/12 1155  AST 22  --   ALT 19  --   ALKPHOS 177*  --   BILITOT 1.6*  --   PROT 7.5  --   ALBUMIN 1.7* 1.7*   No results found for this basename: LIPASE, AMYLASE,  in the last 168 hours No  results found for this basename: AMMONIA,  in the last 168 hours CBC:  Recent Labs Lab 10/10/12 0500 10/11/12 0515 10/12/12 0530 10/15/12 0500  WBC 15.8* 15.3* 13.7* 9.1  NEUTROABS  --   --  10.7*  --   HGB 7.6* 8.1* 8.4* 8.9*  HCT 23.1* 24.6* 25.5* 26.9*  MCV 83.4 82.6 82.5 82.3  PLT 510* 490* 447* 391   Cardiac Enzymes: No results found for this basename: CKTOTAL, CKMB, CKMBINDEX, TROPONINI,  in the last 168 hours BNP (last 3 results) No results found for this basename: PROBNP,  in the last 8760 hours CBG:  Recent Labs Lab 10/14/12 1615 10/14/12 2050 10/15/12 0106 10/15/12 0409 10/15/12 0750  GLUCAP 137* 87 85 80 126*    No results found for this or any previous visit (from  the past 240 hour(s)).   Studies: No results found.  Scheduled Meds: . antiseptic oral rinse  15 mL Mouth Rinse BID  . aspirin  300 mg Rectal BID  . darbepoetin (ARANESP) injection - NON-DIALYSIS  150 mcg Subcutaneous Q Mon-1800  . enoxaparin (LOVENOX) injection  40 mg Subcutaneous Q24H  . feeding supplement  1 Container Oral BID BM  . FLUoxetine  20 mg Oral Daily  . insulin aspart  0-9 Units Subcutaneous Q4H  . insulin detemir  10 Units Subcutaneous Daily  . metoCLOPramide (REGLAN) injection  10 mg Intravenous TID AC & HS  . metoprolol  5 mg Intravenous Q6H  . pantoprazole (PROTONIX) IV  40 mg Intravenous Q24H  . polyethylene glycol  17 g Oral Daily  . potassium chloride  60 mEq Oral Q6H   Continuous Infusions: . dextrose 125 mL/hr at 10/15/12 0417    Principal Problem:   Gangrene of foot Left Active Problems:   H/O: CVA (cerebrovascular accident)   Hypertension   Anemia   DM (diabetes mellitus) type 2, uncontrolled, with ketoacidosis   Nausea with vomiting   Fever   UTI (lower urinary tract infection)   Wound eschar of foot   Major depressive disorder, recurrent episode, moderate    Time spent: 35 minutes    [facility] Pager [phone]. If 7PM-7AM, please contact night-coverage at [email or web address], password [facility] 10/15/2012, 12:14 PM  LOS: 19 days

## 2012-10-16 LAB — CBC
HCT: 27.1 % — ABNORMAL LOW (ref 39.0–52.0)
Hemoglobin: 8.8 g/dL — ABNORMAL LOW (ref 13.0–17.0)
MCH: 26.7 pg (ref 26.0–34.0)
RBC: 3.29 MIL/uL — ABNORMAL LOW (ref 4.22–5.81)

## 2012-10-16 LAB — GLUCOSE, CAPILLARY
Glucose-Capillary: 108 mg/dL — ABNORMAL HIGH (ref 70–99)
Glucose-Capillary: 147 mg/dL — ABNORMAL HIGH (ref 70–99)

## 2012-10-16 LAB — BASIC METABOLIC PANEL
Chloride: 113 mEq/L — ABNORMAL HIGH (ref 96–112)
GFR calc non Af Amer: 46 mL/min — ABNORMAL LOW (ref >90)
Glucose, Bld: 100 mg/dL — ABNORMAL HIGH (ref 70–99)
Potassium: 3.4 mEq/L — ABNORMAL LOW (ref 3.5–5.1)
Sodium: 146 mEq/L — ABNORMAL HIGH (ref 135–145)

## 2012-10-16 MED ORDER — ENSURE COMPLETE PO LIQD
237.0000 mL | Freq: Two times a day (BID) | ORAL | Status: DC
  Administered 2012-10-16 – 2012-10-20 (×3): 237 mL via ORAL

## 2012-10-16 MED ORDER — METOCLOPRAMIDE HCL 5 MG/ML IJ SOLN
5.0000 mg | Freq: Four times a day (QID) | INTRAMUSCULAR | Status: DC | PRN
  Administered 2012-10-18: 5 mg via INTRAVENOUS
  Filled 2012-10-16 (×2): qty 1

## 2012-10-16 MED ORDER — POTASSIUM CHLORIDE CRYS ER 20 MEQ PO TBCR
40.0000 meq | EXTENDED_RELEASE_TABLET | Freq: Four times a day (QID) | ORAL | Status: AC
  Administered 2012-10-16 (×2): 40 meq via ORAL
  Filled 2012-10-16 (×2): qty 2

## 2012-10-16 NOTE — Clinical Documentation Improvement (Signed)
THIS DOCUMENT IS NOT A PERMANENT PART OF THE MEDICAL RECORD  Please update your documentation with the medical record to reflect your response to this query. If you need help knowing how to do this please call [phone].  10/16/12  Dear [facility]. [name],  In a better effort to capture your patient's severity of illness, reflect appropriate length of stay and utilization of resources, a review of the patient medical record has revealed the following indicators the diagnosis of Heart Failure.    Based on your clinical judgment, please clarify TYPE & ACUITY and document in a progress note and/or discharge summary the clinical condition associated with the following supporting information:  In responding to this query please exercise your independent judgment.  The fact that a query is asked, does not imply that any particular answer is desired or expected.  Possible Clinical Conditions?  Chronic Systolic Congestive Heart Failure Chronic Diastolic Congestive Heart Failure Chronic Systolic & Diastolic Congestive Heart Failure Acute Systolic Congestive Heart Failure Acute Diastolic Congestive Heart Failure Acute Systolic & Diastolic Congestive Heart Failure Acute on Chronic Systolic Congestive Heart Failure Acute on Chronic Diastolic Congestive Heart Failure Acute on Chronic Systolic & Diastolic  Congestive Heart Failure Other Condition________________________________________ Cannot Clinically Determine  Supporting Information:   "CHF-on IVF only as cannot take PO. Monitor"   Reviewed: additional documentation in the medical record  Thank You,  Alessandra Grout RN, BSN, CCDS Clinical Documentation Specialist: [phone] Cell= [place]

## 2012-10-16 NOTE — Progress Notes (Signed)
NUTRITION FOLLOW UP  Intervention:   1. D/c Resource 2. Ensure Complete po BID, each supplement provides 350 kcal and 13 grams of protein.   Nutrition Dx:   Inadequate oral intake related to nausea and vomiting as evidenced by pt report. Ongoing   Goal:   Pt will meet >/=90% estimated nutrition needs. Unmet  Monitor:   PO intake/tolerance, weight trends, labs  Assessment:   Pt was able to tolerate free water flushes via NG tube. NG was d/c;d by GI and diet advanced to full liquids. Per notes, pt was planned for GES today, but was given Reglan so it will be rescheduled for tomorrow.   Pt reports he has been able to drink some of the [employer] drink, but does not like it. Pt does not offer much when asked a question. Mostly nods head. Pt agreeable to trying Ensure.   Height: Ht Readings from Last 1 Encounters:  10/11/12 5' 10.87" (1.8 m)    Weight Status:   Wt Readings from Last 1 Encounters:  10/15/12 142 lb 3.2 oz (64.5 kg)    Re-estimated needs:  Kcal: 2050-2200  Protein: 90 - 110 g  Fluid: 2 liters daily  Skin: incisions in L thigh and R leg  Diet Order: Full Liquid   Intake/Output Summary (Last 24 hours) at 10/16/12 1233 Last data filed at 10/16/12 1057  Gross per 24 hour  Intake 2046.67 ml  Output   2100 ml  Net -53.33 ml    Last BM: last documented on 7/28   Labs:   Recent Labs Lab 10/12/12 0530  10/13/12 1155 10/15/12 0500 10/16/12 0335  NA 156*  --  152* 143 146*  K 2.4*  < > 3.0* 2.8* 3.4*  CL 119*  --  117* 108 113*  CO2 26  --  27 25 24   BUN 22  --  18 19 17   CREATININE 1.88*  --  1.66* 1.70* 1.55*  CALCIUM 8.9  --  8.7 8.4 8.2*  MG 1.7  --   --   --  2.0  PHOS  --   --  2.4  --   --   GLUCOSE 182*  --  183* 90 100*  < > = values in this interval not displayed.  CBG (last 3)   Recent Labs  10/15/12 2126 10/16/12 0808 10/16/12 1213  GLUCAP 78 108* 168*    Scheduled Meds: . antiseptic oral rinse  15 mL Mouth Rinse BID   . aspirin  300 mg Rectal BID  . darbepoetin (ARANESP) injection - NON-DIALYSIS  150 mcg Subcutaneous Q Mon-1800  . enoxaparin (LOVENOX) injection  40 mg Subcutaneous Q24H  . feeding supplement  1 Container Oral BID BM  . FLUoxetine  20 mg Oral Daily  . insulin aspart  0-9 Units Subcutaneous TID WC  . insulin detemir  10 Units Subcutaneous Daily  . metoCLOPramide (REGLAN) injection  10 mg Intravenous TID AC & HS  . metoprolol  5 mg Intravenous Q6H  . pantoprazole (PROTONIX) IV  40 mg Intravenous Q24H  . polyethylene glycol  17 g Oral Daily    Continuous Infusions: . dextrose 5 % and 0.45% NaCl 100 mL/hr at 10/16/12 0214    Orson Slick RD, LDN Pager [phone] After Hours pager [phone]

## 2012-10-16 NOTE — Progress Notes (Signed)
Pt scheduled for gastric emptying test this am. given scheduled dose of reglan this am. Gastric emptying test will be rescheduled for tomorrow. Dr. [name] Poli notified. Pharmacy notified to hold Reglan dose tomorrow am. Pt placed back on full liquids. No c/o nausea or vomiting,

## 2012-10-16 NOTE — Progress Notes (Signed)
[facility] PROGRESS NOTE  [name] [id number] DOB: [birth year] DOA: 09/26/2012 PCP: No PCP Per Patient   HPI/Subjective: Tolerated clear liquids yesterday, hold fast a full liquid today. Gastric emptying studies not done because patient received Reglan today. Patient more conversational animated today, he wants to be screened for heavy metal poisoning. Likely to be discharged in a.m. back to his nursing home, if he tolerates diet.  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 NGT in place. HIDA neg. GES canont be done sop close to HIDA-patient to stay over weekend for GES.  Hypernatremia -Likely secondary to free water deficit which estimated to be 4.2 L. -Patient is on D5W, will change IV fluid to D5W half-normal saline.  Hypokalemia -Replete orally. Check BMP in a.m.  Leukocytosis-unclear etiology-monitor for fevers.  Severe malnutrition-Await decision Re: Panda tube. Start free water. Hold feeds as still vomiting  Cough? Aspiration-no overt Pna. Monitor  CHF-on IVF only as cannot take PO. Monitor   DM type 2-Blood sugars 157-186-continue Levemir 10 Units daily and sensitive Scale coverage-sugars 130-180 and controllede   Anemia-ABLA + nutritional-Monitor. Continue Aranesp.   AKI-stabilising-creat trending downwards. Cont IVF   Yeast UTI-Rx with diflucan Elevated alk phos, Bili-? 2/2 to Antifungals. Rpt in 1-2 days   H/o CVA-continue ASA 325 daily Depression-consider psych consult-monitor Htn-continue metoprolol 5 mg IV q6h   Code Status: Full code Family Communication: Plan discussed with patient Disposition Plan: Remain inpatient   Consultants:  GI  Procedures:  None  Antibiotics:  None   Objective: Filed Vitals:   10/16/12 0954  BP: 138/71  Pulse: 72  Temp: 98.5 F (36.9 C)  Resp: 15    Intake/Output Summary (Last 24 hours) at 10/16/12 1524 Last data filed at 10/16/12 1307  Gross per 24 hour   Intake 2221.67 ml  Output   1800 ml  Net 421.67 ml   Filed Weights   10/13/12 2006 10/14/12 2054 10/15/12 2128  Weight: 67.3 kg (148 lb 5.9 oz) 64 kg (141 lb 1.5 oz) 64.5 kg (142 lb 3.2 oz)    Exam:  General: Alert and awake, oriented x3, not in any acute distress. HEENT: anicteric sclera, pupils reactive to light and accommodation, EOMI CVS: S1-S2 clear, no murmur rubs or gallops Chest: clear to auscultation bilaterally, no wheezing, rales or rhonchi Abdomen: soft nontender, nondistended, normal bowel sounds, no organomegaly Extremities: Bilateral BKA Neuro: Cranial nerves II-XII intact, no focal neurological deficits  Data Reviewed: Basic Metabolic Panel:  Recent Labs Lab 10/11/12 0515 10/12/12 0530 10/13/12 0556 10/13/12 1155 10/15/12 0500 10/16/12 0335  NA 155* 156*  --  152* 143 146*  K 2.7* 2.4* 3.1* 3.0* 2.8* 3.4*  CL 120* 119*  --  117* 108 113*  CO2 24 26  --  27 25 24   GLUCOSE 185* 182*  --  183* 90 100*  BUN 26* 22  --  18 19 17   CREATININE 2.10* 1.88*  --  1.66* 1.70* 1.55*  CALCIUM 9.2 8.9  --  8.7 8.4 8.2*  MG  --  1.7  --   --   --  2.0  PHOS  --   --   --  2.4  --   --    Liver Function Tests:  Recent Labs Lab 10/12/12 0530 10/13/12 1155  AST 22  --   ALT 19  --   ALKPHOS 177*  --   BILITOT 1.6*  --   PROT 7.5  --   ALBUMIN  1.7* 1.7*   No results found for this basename: LIPASE, AMYLASE,  in the last 168 hours No results found for this basename: AMMONIA,  in the last 168 hours CBC:  Recent Labs Lab 10/10/12 0500 10/11/12 0515 10/12/12 0530 10/15/12 0500 10/16/12 0335  WBC 15.8* 15.3* 13.7* 9.1 7.3  NEUTROABS  --   --  10.7*  --   --   HGB 7.6* 8.1* 8.4* 8.9* 8.8*  HCT 23.1* 24.6* 25.5* 26.9* 27.1*  MCV 83.4 82.6 82.5 82.3 82.4  PLT 510* 490* 447* 391 352   Cardiac Enzymes: No results found for this basename: CKTOTAL, CKMB, CKMBINDEX, TROPONINI,  in the last 168 hours BNP (last 3 results) No results found for this basename:  PROBNP,  in the last 8760 hours CBG:  Recent Labs Lab 10/15/12 1159 10/15/12 1614 10/15/12 2126 10/16/12 0808 10/16/12 1213  GLUCAP 110* 139* 78 108* 168*    No results found for this or any previous visit (from the past 240 hour(s)).   Studies: No results found.  Scheduled Meds: . antiseptic oral rinse  15 mL Mouth Rinse BID  . aspirin  300 mg Rectal BID  . darbepoetin (ARANESP) injection - NON-DIALYSIS  150 mcg Subcutaneous Q Mon-1800  . enoxaparin (LOVENOX) injection  40 mg Subcutaneous Q24H  . feeding supplement  237 mL Oral BID BM  . FLUoxetine  20 mg Oral Daily  . insulin aspart  0-9 Units Subcutaneous TID WC  . insulin detemir  10 Units Subcutaneous Daily  . metoCLOPramide (REGLAN) injection  10 mg Intravenous TID AC & HS  . metoprolol  5 mg Intravenous Q6H  . pantoprazole (PROTONIX) IV  40 mg Intravenous Q24H  . polyethylene glycol  17 g Oral Daily   Continuous Infusions: . dextrose 5 % and 0.45% NaCl 1,000 mL (10/16/12 1307)    Principal Problem:   Gangrene of foot Left Active Problems:   H/O: CVA (cerebrovascular accident)   Hypertension   Anemia   DM (diabetes mellitus) type 2, uncontrolled, with ketoacidosis   Nausea with vomiting   Fever   UTI (lower urinary tract infection)   Wound eschar of foot   Major depressive disorder, recurrent episode, moderate    Time spent: 35 minutes    [facility] Pager [phone]. If 7PM-7AM, please contact night-coverage at [email or web address], password [facility] 10/16/2012, 3:24 PM  LOS: 20 days

## 2012-10-16 NOTE — Progress Notes (Signed)
Pt having large frequent watery stools. Dr. [name] Poli notified.

## 2012-10-17 ENCOUNTER — Inpatient Hospital Stay (HOSPITAL_COMMUNITY): Payer: Medicare Other

## 2012-10-17 LAB — BASIC METABOLIC PANEL
Calcium: 8.6 mg/dL (ref 8.4–10.5)
Chloride: 114 mEq/L — ABNORMAL HIGH (ref 96–112)
Creatinine, Ser: 1.53 mg/dL — ABNORMAL HIGH (ref 0.50–1.35)
GFR calc Af Amer: 55 mL/min — ABNORMAL LOW (ref >90)
GFR calc non Af Amer: 47 mL/min — ABNORMAL LOW (ref >90)

## 2012-10-17 LAB — GLUCOSE, CAPILLARY
Glucose-Capillary: 60 mg/dL — ABNORMAL LOW (ref 70–99)
Glucose-Capillary: 70 mg/dL (ref 70–99)
Glucose-Capillary: 79 mg/dL (ref 70–99)
Glucose-Capillary: 89 mg/dL (ref 70–99)

## 2012-10-17 NOTE — Progress Notes (Signed)
[facility] PROGRESS NOTE  Carter Auth [id number] DOB: 29-Dec-1950 DOA: 09/26/2012 PCP: No PCP Per Patient   HPI/Subjective: Tolerated clear liquids yesterday, hold fast a full liquid today. Gastric emptying studies not done because patient received Reglan today. Patient more conversational animated today, he wants to be screened for heavy metal poisoning. Likely to be discharged in a.m. back to his nursing home, if he tolerates diet.  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 NGT in place. HIDA neg. GES canont be done sop close to HIDA-patient to stay over weekend for GES. Gastric emptying study to be done today. Results pending. Patient doing very okay today, no vomiting for the past 2-3 days.  Hypernatremia -Likely secondary to free water deficit which estimated to be 4.2 L. -Patient is on D5W, will change IV fluid to D5W half-normal saline. -We'll discontinue IV fluids and monitor overnight.  Hypokalemia -Replete orally. Check BMP in a.m.  Leukocytosis-unclear etiology-monitor for fevers.  Severe malnutrition-Await decision Re: Panda tube. Start free water. Hold feeds as still vomiting  Cough? Aspiration-no overt Pna. Monitor  CHF: Chronic systolic CHF with LVEF [id number] last checked on my review on January of 2009. This is compensated, no symptoms of CHF.  DM type 2-Blood sugars 157-186-continue Levemir 10 Units daily and sensitive Scale coverage-sugars 130-180 and controllede   Anemia-ABLA + nutritional-Monitor. Continue Aranesp.   AKI-stabilising-creat trending downwards. Cont IVF   Yeast UTI-Rx with diflucan Elevated alk phos, Bili-? 2/2 to Antifungals. Rpt in 1-2 days   H/o CVA-continue ASA 325 daily Depression-consider psych consult-monitor Htn-continue metoprolol 5 mg IV q6h   Code Status: Full code Family Communication: Plan discussed with patient Disposition Plan: Remain  inpatient   Consultants:  GI  Procedures:  None  Antibiotics:  None   Objective: Filed Vitals:   10/17/12 0540  BP: 139/66  Pulse: 71  Temp: 98.1 F (36.7 C)  Resp: 16    Intake/Output Summary (Last 24 hours) at 10/17/12 1243 Last data filed at 10/17/12 0603  Gross per 24 hour  Intake 2751.66 ml  Output   3340 ml  Net -588.34 ml   Filed Weights   10/14/12 2054 10/15/12 2128 10/16/12 2119  Weight: 64 kg (141 lb 1.5 oz) 64.5 kg (142 lb 3.2 oz) 64.501 kg (142 lb 3.2 oz)    Exam:  General: Alert and awake, oriented x3, not in any acute distress. HEENT: anicteric sclera, pupils reactive to light and accommodation, EOMI CVS: S1-S2 clear, no murmur rubs or gallops Chest: clear to auscultation bilaterally, no wheezing, rales or rhonchi Abdomen: soft nontender, nondistended, normal bowel sounds, no organomegaly Extremities: Bilateral BKA Neuro: Cranial nerves II-XII intact, no focal neurological deficits  Data Reviewed: Basic Metabolic Panel:  Recent Labs Lab 10/12/12 0530 10/13/12 0556 10/13/12 1155 10/15/12 0500 10/16/12 0335 10/17/12 0517  NA 156*  --  152* 143 146* 146*  K 2.4* 3.1* 3.0* 2.8* 3.4* 4.3  CL 119*  --  117* 108 113* 114*  CO2 26  --  27 25 24 23   GLUCOSE 182*  --  183* 90 100* 165*  BUN 22  --  18 19 17 16   CREATININE 1.88*  --  1.66* 1.70* 1.55* 1.53*  CALCIUM 8.9  --  8.7 8.4 8.2* 8.6  MG 1.7  --   --   --  2.0  --   PHOS  --   --  2.4  --   --   --    Liver  Function Tests:  Recent Labs Lab 10/12/12 0530 10/13/12 1155  AST 22  --   ALT 19  --   ALKPHOS 177*  --   BILITOT 1.6*  --   PROT 7.5  --   ALBUMIN 1.7* 1.7*   No results found for this basename: LIPASE, AMYLASE,  in the last 168 hours No results found for this basename: AMMONIA,  in the last 168 hours CBC:  Recent Labs Lab 10/11/12 0515 10/12/12 0530 10/15/12 0500 10/16/12 0335  WBC 15.3* 13.7* 9.1 7.3  NEUTROABS  --  10.7*  --   --   HGB 8.1* 8.4* 8.9*  8.8*  HCT 24.6* 25.5* 26.9* 27.1*  MCV 82.6 82.5 82.3 82.4  PLT 490* 447* 391 352   Cardiac Enzymes: No results found for this basename: CKTOTAL, CKMB, CKMBINDEX, TROPONINI,  in the last 168 hours BNP (last 3 results) No results found for this basename: PROBNP,  in the last 8760 hours CBG:  Recent Labs Lab 10/16/12 1213 10/16/12 1719 10/16/12 2111 10/17/12 0744 10/17/12 1145  GLUCAP 168* 147* 133* 147* 156*    No results found for this or any previous visit (from the past 240 hour(s)).   Studies: No results found.  Scheduled Meds: . antiseptic oral rinse  15 mL Mouth Rinse BID  . aspirin  300 mg Rectal BID  . darbepoetin (ARANESP) injection - NON-DIALYSIS  150 mcg Subcutaneous Q Mon-1800  . enoxaparin (LOVENOX) injection  40 mg Subcutaneous Q24H  . feeding supplement  237 mL Oral BID BM  . FLUoxetine  20 mg Oral Daily  . insulin aspart  0-9 Units Subcutaneous TID WC  . insulin detemir  10 Units Subcutaneous Daily  . metoprolol  5 mg Intravenous Q6H  . pantoprazole (PROTONIX) IV  40 mg Intravenous Q24H   Continuous Infusions: . dextrose 5 % and 0.45% NaCl 100 mL/hr at 10/17/12 1021    Principal Problem:   Gangrene of foot Left Active Problems:   H/O: CVA (cerebrovascular accident)   Hypertension   Anemia   DM (diabetes mellitus) type 2, uncontrolled, with ketoacidosis   Nausea with vomiting   Fever   UTI (lower urinary tract infection)   Wound eschar of foot   Major depressive disorder, recurrent episode, moderate    Time spent: 35 minutes    [facility] Pager [phone]. If 7PM-7AM, please contact night-coverage at [email or web address], password [facility] 10/17/2012, 12:43 PM  LOS: 21 days

## 2012-10-17 NOTE — Progress Notes (Signed)
Updated SNF of possible d/c tomorrow-  Eduard Clos, MSW [phone]

## 2012-10-18 LAB — BASIC METABOLIC PANEL
BUN: 14 mg/dL (ref 6–23)
CO2: 24 mEq/L (ref 19–32)
Calcium: 8.7 mg/dL (ref 8.4–10.5)
Creatinine, Ser: 1.44 mg/dL — ABNORMAL HIGH (ref 0.50–1.35)
Glucose, Bld: 115 mg/dL — ABNORMAL HIGH (ref 70–99)

## 2012-10-18 LAB — GLUCOSE, CAPILLARY
Glucose-Capillary: 132 mg/dL — ABNORMAL HIGH (ref 70–99)
Glucose-Capillary: 60 mg/dL — ABNORMAL LOW (ref 70–99)

## 2012-10-18 MED ORDER — DEXTROSE 50 % IV SOLN: 25.0000 mL | Freq: Once | INTRAVENOUS | Status: AC | PRN

## 2012-10-18 MED ORDER — DEXTROSE 50 % IV SOLN
INTRAVENOUS | Status: AC
  Administered 2012-10-18: 50 mL
  Filled 2012-10-18: qty 50

## 2012-10-18 MED ORDER — ASPIRIN 325 MG PO TABS
325.0000 mg | ORAL_TABLET | Freq: Two times a day (BID) | ORAL | Status: DC
  Administered 2012-10-18 – 2012-10-20 (×5): 325 mg via ORAL
  Filled 2012-10-18 (×7): qty 1

## 2012-10-18 MED ORDER — DEXTROSE 5 % IV SOLN
INTRAVENOUS | Status: DC
  Administered 2012-10-18: 22:00:00 via INTRAVENOUS

## 2012-10-18 MED ORDER — METOCLOPRAMIDE HCL 5 MG/ML IJ SOLN
10.0000 mg | Freq: Three times a day (TID) | INTRAMUSCULAR | Status: DC
Start: 2012-10-18 — End: 2012-10-20
  Administered 2012-10-18 – 2012-10-20 (×7): 10 mg via INTRAVENOUS
  Filled 2012-10-18 (×10): qty 2

## 2012-10-18 NOTE — Progress Notes (Addendum)
[facility] PROGRESS NOTE  [name] [id number] DOB: 1950-03-16 DOA: 09/26/2012 PCP: No PCP Per Patient   HPI/Subjective: Tolerated clear liquids yesterday, hold fast a full liquid today. Gastric emptying studies not done because patient received Reglan today. Patient more conversational animated today, he wants to be screened for heavy metal poisoning. Likely to be discharged in a.m. back to his nursing home, if he tolerates diet.  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 HIDA neg.  Gastric emptying study never completed (per nursing patient unable to tolerate).  Hypernatremia -Likely secondary to free water deficit. DC D5 half-normal saline. Restart D5W at 50 ml/hr  Hypokalemia -Replete orally. Check CMP daily, also obtain a daily magnesium.  Leukocytosis-obtain CBC daily. Low threshold for starting antibiotics  Severe malnutrition-Await decision Re: Panda tube. Start free water. Hold feeds as still vomiting. Restart metoclopramide IV with meals and bedtime  Cough resolved  CHF: Chronic systolic CHF with LVEF [id number] last checked on my review on January of 2009. This is compensated, no symptoms of CHF.  DM type 2-Blood sugars 157-186-continue Levemir 10 Units daily and sensitive Scale coverage-sugars 130-180 and controlled.  Patient is S/P EGD on 10/08/2012 believe all symptoms are secondary to gastroparesis diabetic start metoclopramide.   Anemia-ABLA + nutritional-Monitor. Continue Aranesp.   AKI-stabilising-creat trending downwards. Cont IVF   Yeast UTI-Rx with diflucan Elevated alk phos, Bili-? 2/2 to Antifungals. Rpt in 1-2 days   H/o CVA-continue ASA 325 daily   Htn-continue metoprolol 5 mg IV q6h. Currently controlled     Code Status: Full code Family Communication: Plan discussed with patient Disposition Plan: Remain  inpatient   Consultants:  GI  Procedures:  None  Antibiotics:  None   Objective: Filed Vitals:   10/18/12 1839  BP: 140/69  Pulse: 72  Temp: 98.2 F (36.8 C)  Resp: 20    Intake/Output Summary (Last 24 hours) at 10/18/12 2031 Last data filed at 10/18/12 1900  Gross per 24 hour  Intake   1204 ml  Output   2716 ml  Net  -1512 ml   Filed Weights   10/15/12 2128 10/16/12 2119 10/17/12 2103  Weight: 64.5 kg (142 lb 3.2 oz) 64.501 kg (142 lb 3.2 oz) 64.501 kg (142 lb 3.2 oz)    Exam:  General: Alert and awake, oriented x3, not in any acute distress. HEENT: anicteric sclera, pupils reactive to light and accommodation, EOMI CVS: S1-S2 clear, no murmur rubs or gallops Chest: clear to auscultation bilaterally, no wheezing, rales or rhonchi Abdomen: soft nontender, nondistended, normal bowel sounds, no organomegaly Extremities: Bilateral BKA Neuro: Cranial nerves II-XII intact, no focal neurological deficits  Data Reviewed: Basic Metabolic Panel:  Recent Labs Lab 10/12/12 0530  10/13/12 1155 10/15/12 0500 10/16/12 0335 10/17/12 0517 10/18/12 0400  NA 156*  --  152* 143 146* 146* 147*  K 2.4*  < > 3.0* 2.8* 3.4* 4.3 4.2  CL 119*  --  117* 108 113* 114* 115*  CO2 26  --  27 25 24 23 24   GLUCOSE 182*  --  183* 90 100* 165* 115*  BUN 22  --  18 19 17 16 14   CREATININE 1.88*  --  1.66* 1.70* 1.55* 1.53* 1.44*  CALCIUM 8.9  --  8.7 8.4 8.2* 8.6 8.7  MG 1.7  --   --   --  2.0  --   --   PHOS  --   --  2.4  --   --   --   --   < > = values in this interval not displayed. Liver Function Tests:  Recent Labs Lab 10/12/12 0530 10/13/12 1155  AST 22  --   ALT 19  --   ALKPHOS 177*  --   BILITOT 1.6*  --   PROT 7.5  --   ALBUMIN 1.7* 1.7*   No results found for this basename: LIPASE, AMYLASE,  in the last 168 hours No results found for this basename: AMMONIA,  in the last 168 hours CBC:  Recent Labs Lab 10/12/12 0530 10/15/12 0500 10/16/12 0335  WBC  13.7* 9.1 7.3  NEUTROABS 10.7*  --   --   HGB 8.4* 8.9* 8.8*  HCT 25.5* 26.9* 27.1*  MCV 82.5 82.3 82.4  PLT 447* 391 352   Cardiac Enzymes: No results found for this basename: CKTOTAL, CKMB, CKMBINDEX, TROPONINI,  in the last 168 hours BNP (last 3 results) No results found for this basename: PROBNP,  in the last 8760 hours CBG:  Recent Labs Lab 10/18/12 0805 10/18/12 1157 10/18/12 1611 10/18/12 1648 10/18/12 1720  GLUCAP 132* 142* 60* 65* 78    No results found for this or any previous visit (from the past 240 hour(s)).   Studies: No results found.  Scheduled Meds: . antiseptic oral rinse  15 mL Mouth Rinse BID  . aspirin  325 mg Oral BID  . darbepoetin (ARANESP) injection - NON-DIALYSIS  150 mcg Subcutaneous Q Mon-1800  . enoxaparin (LOVENOX) injection  40 mg Subcutaneous Q24H  . feeding supplement  237 mL Oral BID BM  . FLUoxetine  20 mg Oral Daily  . insulin aspart  0-9 Units Subcutaneous TID WC  . insulin detemir  10 Units Subcutaneous Daily  . metoprolol  5 mg Intravenous Q6H  . pantoprazole (PROTONIX) IV  40 mg Intravenous Q24H   Continuous Infusions: . dextrose 5 % and 0.45% NaCl 20 mL/hr at 10/18/12 1008    Principal Problem:   Gangrene of foot Left Active Problems:   H/O: CVA (cerebrovascular accident)   Hypertension   Anemia   DM (diabetes mellitus) type 2, uncontrolled, with ketoacidosis   Nausea with vomiting   Fever   UTI (lower urinary tract infection)   Wound eschar of foot   Major depressive disorder, recurrent episode, moderate    Time spent: 35 minutes    Onda Kattner, [place], J  [facility] Pager [phone]. If 7PM-7AM, please contact night-coverage at [email or web address], password [facility] 10/18/2012, 8:31 PM  LOS: 22 days

## 2012-10-18 NOTE — Progress Notes (Signed)
NUTRITION FOLLOW UP  Intervention:   1. Continue Ensure    2. Pt would likely benefit from placement of NJ tube for temporary enteral nutrition if unable to tolerate full liquids.    Nutrition Dx:   Inadequate oral intake related to nausea and vomiting as evidenced by pt report. Ongoing   Goal:   Pt will meet >/=90% estimated nutrition needs. Unmet  Monitor:   PO intake/tolerance, weight trends, labs  Assessment:   Pt was not able to tolerate Gastric Emptying study, again vomited. Per RN in rounds, pt was only able to take water and some apple juice for nutrition yesterday.  Pt is not meeting nutrition needs with oral intake. Has not been able to tolerate full liquids. Pt would likely benefit from temporary enteral nutrition via NJ tube.   Height: Ht Readings from Last 1 Encounters:  10/11/12 5' 10.87" (1.8 m)    Weight Status:   Wt Readings from Last 1 Encounters:  10/17/12 142 lb 3.2 oz (64.501 kg)  continues to trend down   Re-estimated needs:  Kcal: 2050-2200  Protein: 90 - 110 g  Fluid: 2 liters daily  Skin: incisions in L thigh and R leg  Diet Order: Full Liquid   Intake/Output Summary (Last 24 hours) at 10/18/12 1009 Last data filed at 10/18/12 [id number]  Gross per 24 hour  Intake 2167.33 ml  Output   2016 ml  Net 151.33 ml    Last BM: 8/11    Labs:   Recent Labs Lab 10/12/12 0530  10/13/12 1155  10/16/12 0335 10/17/12 0517 10/18/12 0400  NA 156*  --  152*  < > 146* 146* 147*  K 2.4*  < > 3.0*  < > 3.4* 4.3 4.2  CL 119*  --  117*  < > 113* 114* 115*  CO2 26  --  27  < > 24 23 24   BUN 22  --  18  < > 17 16 14   CREATININE 1.88*  --  1.66*  < > 1.55* 1.53* 1.44*  CALCIUM 8.9  --  8.7  < > 8.2* 8.6 8.7  MG 1.7  --   --   --  2.0  --   --   PHOS  --   --  2.4  --   --   --   --   GLUCOSE 182*  --  183*  < > 100* 165* 115*  < > = values in this interval not displayed.  CBG (last 3)   Recent Labs  10/17/12 2220 10/17/12 2339 10/18/12 0805   GLUCAP 70 79 132*    Scheduled Meds: . antiseptic oral rinse  15 mL Mouth Rinse BID  . aspirin  300 mg Rectal BID  . darbepoetin (ARANESP) injection - NON-DIALYSIS  150 mcg Subcutaneous Q Mon-1800  . enoxaparin (LOVENOX) injection  40 mg Subcutaneous Q24H  . feeding supplement  237 mL Oral BID BM  . FLUoxetine  20 mg Oral Daily  . insulin aspart  0-9 Units Subcutaneous TID WC  . insulin detemir  10 Units Subcutaneous Daily  . metoprolol  5 mg Intravenous Q6H  . pantoprazole (PROTONIX) IV  40 mg Intravenous Q24H    Continuous Infusions: . dextrose 5 % and 0.45% NaCl 20 mL/hr at 10/18/12 1008    Orson Slick RD, LDN Pager [phone] After Hours pager [phone]

## 2012-10-18 NOTE — Progress Notes (Signed)
Hypoglycemic Event  CBG: 60  Treatment: 15 GM carbohydrate snack  Symptoms: None  Follow-up CBG: Time: 17:20 CBG Result: 78  Possible Reasons for Event: Inadequate meal intake  Comments/MD notified: MD Sherral Hammers notified    [place] Voytko L  Remember to initiate Hypoglycemia Order Set & complete

## 2012-10-18 NOTE — Progress Notes (Signed)
SNF liason stopped by to see patient upon my request to help give [place] an idea of where he is cognitively and emotionally based on his baseline at the SNF- She reports patient is typically kind of "flat" and upon her visit found him to be perhaps somewhat different than his usual but she was able to encourage him and offer support as well as get him to smile- she plans to visit again tomorrow. Eduard Clos, MSW [phone]

## 2012-10-18 NOTE — Progress Notes (Signed)
Physical Therapy Treatment Patient Details Name: [name] MRN: [id number] DOB: 1950/10/22 Today's Date: 10/18/2012 Time: [id number] PT Time Calculation (min): 40 min  PT Assessment / Plan / Recommendation  History of Present Illness pt presents with Bil BKA.     PT Comments   Pt continues with flat affect however occasionally answered questions from therapist but otherwise very quiet.  Pt transferred to/from w/c and performed w/c mobility requiring increased time to propel and process moving around obstacles.   Follow Up Recommendations  SNF     Does the patient have the potential to tolerate intense rehabilitation     Barriers to Discharge        Equipment Recommendations  None recommended by PT    Recommendations for Other Services    Frequency     Progress towards PT Goals Progress towards PT goals: Progressing toward goals  Plan Current plan remains appropriate    Precautions / Restrictions Precautions Precautions: Fall Restrictions Other Position/Activity Restrictions: bil BKA   Pertinent Vitals/Pain n/a    Mobility  Bed Mobility Bed Mobility: Supine to Sit Supine to Sit: 5: Supervision;HOB elevated Transfers Transfers: Lateral/Scoot Transfers Lateral/Scoot Transfers: With armrests removed;3: Mod assist;From elevated surface Details for Transfer Assistance: verbal cues for technique over to w/c, requires increased time and elevated surface, upon return to bed pt scoot to edge then rolled onto stomach with mod assist for hips/lower body to roll onto side (as pt laying on PICC line and required verbal cues to correct) (RN notified to check PICC line as line pulled tight with roll) Ambulation/Gait Ambulation/Gait Assistance: Not tested (comment) Stairs: No Wheelchair Mobility Wheelchair Mobility: Yes Wheelchair Assistance: 5: Investment banker, operational Details (indicate cue type and reason): pt requires increased time to propel and increased time to  process and then perform tasks involving objects only requiring 1-2 cues otherwise let pt work through [employer]: Both upper extremities Wheelchair Parts Management: Needs assistance (for leg rests) Distance: 40    Exercises     PT Diagnosis:    PT Problem List:   PT Treatment Interventions:     PT Goals (current goals can now be found in the care plan section)    Visit Information  Last PT Received On: 10/18/12 Assistance Needed: +1 History of Present Illness: pt presents with Bil BKA.      Subjective Data      Cognition  Cognition Arousal/Alertness: Awake/alert Behavior During Therapy: Flat affect Overall Cognitive Status: Within Functional Limits for tasks assessed    Balance     End of Session PT - End of Session Activity Tolerance: Patient tolerated treatment well Patient left: in bed;with call bell/phone within reach Nurse Communication: Mobility status   GP     [name],[name] 10/18/2012, 11:38 AM [name], PT, DPT 10/18/2012 Pager: [phone]

## 2012-10-19 LAB — CBC WITH DIFFERENTIAL/PLATELET
Basophils Absolute: 0 10*3/uL (ref 0.0–0.1)
Basophils Relative: 0 % (ref 0–1)
Eosinophils Absolute: 0.1 10*3/uL (ref 0.0–0.7)
Eosinophils Relative: 2 % (ref 0–5)
Lymphs Abs: 1.9 10*3/uL (ref 0.7–4.0)
MCH: 27.2 pg (ref 26.0–34.0)
MCHC: 32.9 g/dL (ref 30.0–36.0)
MCV: 82.9 fL (ref 78.0–100.0)
Platelets: 291 10*3/uL (ref 150–400)
RDW: 20 % — ABNORMAL HIGH (ref 11.5–15.5)

## 2012-10-19 LAB — COMPREHENSIVE METABOLIC PANEL
ALT: 21 U/L (ref 0–53)
Albumin: 1.9 g/dL — ABNORMAL LOW (ref 3.5–5.2)
Calcium: 8.7 mg/dL (ref 8.4–10.5)
GFR calc Af Amer: 61 mL/min — ABNORMAL LOW (ref >90)
Glucose, Bld: 108 mg/dL — ABNORMAL HIGH (ref 70–99)
Sodium: 140 mEq/L (ref 135–145)
Total Protein: 7 g/dL (ref 6.0–8.3)

## 2012-10-19 LAB — GLUCOSE, CAPILLARY
Glucose-Capillary: 144 mg/dL — ABNORMAL HIGH (ref 70–99)
Glucose-Capillary: 193 mg/dL — ABNORMAL HIGH (ref 70–99)
Glucose-Capillary: 151 mg/dL — ABNORMAL HIGH (ref 70–99)
Glucose-Capillary: 89 mg/dL (ref 70–99)

## 2012-10-19 LAB — CLOSTRIDIUM DIFFICILE BY PCR: Toxigenic C. Difficile by PCR: NEGATIVE

## 2012-10-19 MED ORDER — OXYCODONE HCL 5 MG PO TABS
5.0000 mg | ORAL_TABLET | ORAL | Status: DC | PRN
  Administered 2012-10-19: 10 mg via ORAL
  Filled 2012-10-19: qty 2

## 2012-10-19 MED ORDER — MEGESTROL ACETATE 40 MG PO TABS: 40.0000 mg | ORAL_TABLET | Freq: Every day | ORAL | Status: DC

## 2012-10-19 MED ORDER — FLUOXETINE HCL 20 MG PO CAPS
40.0000 mg | ORAL_CAPSULE | Freq: Every day | ORAL | Status: DC
Start: 2012-10-20 — End: 2012-10-20
  Administered 2012-10-20: 40 mg via ORAL
  Filled 2012-10-19: qty 2

## 2012-10-19 MED ORDER — MEGESTROL ACETATE 40 MG/ML PO SUSP
400.0000 mg | Freq: Every day | ORAL | Status: DC
Start: 2012-10-19 — End: 2012-10-20
  Administered 2012-10-19 – 2012-10-20 (×2): 400 mg via ORAL
  Filled 2012-10-19 (×2): qty 10

## 2012-10-19 NOTE — Progress Notes (Addendum)
[facility] PROGRESS NOTE  [name] [id number] DOB: 29-Aug-1950 DOA: 09/26/2012 PCP: No PCP Per Patient   HPI/Subjective:  [name] is a 62 y.o.BM PMHx  hypertension, diabetes, CVA, depression, chronic kidney disease baseline creatinine 1.2- 1.4, gastroesophageal reflux disease, history of gangrenous foot status post right BKA 09/06/2012 who presents to the ED with a three-day history of nausea and emesis. Patient endorses some fevers, and chills. Patient denies any shortness of breath, no chest pain, nondominant pain, no dysuria, no diarrhea, no constipation, no weakness.  Patient also notes some warmth and slight erythema in the left lower extremity that has superficial lesions. Patient denies any discharge from his left lower extremity.  Patient is seen in the ED comprehensive metabolic profile done had a creatinine of 1.68 glucose of 129 alk phosphatase of 203 albumin of 1.8 otherwise was within normal limits. CBC had a white count of 16.5 hemoglobin of 7.5 and a platelet count of 374. Urinalysis done had moderate leukocytes 7-10 white blood cells. Chest x-ray was negative for any infiltrates. X-ray of the right knee showed no acute abnormality. We will called to admit the patient for further evaluation and management.  It was noted that patient had presented from his orthopod's office, Dr. Damita Dunnings office to the ED. I spoke with Dr. Grandville Silos from Dr. 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 Check CMP daily, also obtain a daily magnesium.  Leukocytosis-obtain CBC daily. Low threshold for starting antibiotics  Severe malnutrition-patient starting to eat. Start patient on Megace 40 mg daily  Cough resolved  CHF: Chronic systolic CHF with LVEF [id number] last checked on my review on January of 2009. This is compensated, no symptoms of CHF.  DM type 2-Blood sugars 157-186-continue Levemir 10 Units daily and sensitive Scale coverage-sugars 130-180 and controlled.  Patient is S/P EGD on 10/08/2012 believe all symptoms are secondary to gastroparesis diabetic start metoclopramide.   Anemia-ABLA + nutritional-Monitor. Continue Aranesp.   AKI-stabilising-creat trending downwards. Cont IVF   Yeast UTI-Rx with diflucan Elevated alk phos, Bili-? 2/2 to Antifungals. Rpt in 1-2 days   H/o CVA-continue ASA 325 daily   Htn-continue metoprolol 5 mg IV q6h. Currently controlled  Depression; increase Prozac to 40 mg daily     Code Status: Full code Family Communication: Plan discussed with patient Disposition Plan: Remain inpatient   Consultants:  GI  Procedures:  None  Antibiotics:  None   Objective: Filed Vitals:   10/19/12 0800  BP: 134/70  Pulse: 78  Temp: 97.8 F (36.6 C)  Resp: 20    Intake/Output Summary (Last 24 hours) at 10/19/12 0927 Last data filed at 10/19/12 0417  Gross per 24 hour  Intake  996.5 ml  Output   2800 ml  Net -1803.5 ml   Filed Weights   10/16/12 2119 10/17/12 2103 10/18/12 2143  Weight: 64.501  kg (142 lb 3.2 oz) 64.501 kg (142 lb 3.2 oz) 64.501 kg (142 lb 3.2 oz)    Exam:  General: Alert and awake, oriented x3, not in any acute distress. HEENT: anicteric sclera, pupils reactive to light and accommodation, EOMI CVS: S1-S2 clear, no murmur  rubs or gallops Chest: clear to auscultation bilaterally, no wheezing, rales or rhonchi Abdomen: soft nontender, nondistended, normal bowel sounds, no organomegaly Extremities: Bilateral BKA Neuro: Cranial nerves II-XII intact, no focal neurological deficits  Data Reviewed: Basic Metabolic Panel:  Recent Labs Lab 10/13/12 1155 10/15/12 0500 10/16/12 0335 10/17/12 0517 10/18/12 0400 10/19/12 0600  NA 152* 143 146* 146* 147* 140  K 3.0* 2.8* 3.4* 4.3 4.2 4.0  CL 117* 108 113* 114* 115* 106  CO2 27 25 24 23 24 23   GLUCOSE 183* 90 100* 165* 115* 108*  BUN 18 19 17 16 14 13   CREATININE 1.66* 1.70* 1.55* 1.53* 1.44* 1.40*  CALCIUM 8.7 8.4 8.2* 8.6 8.7 8.7  MG  --   --  2.0  --   --  1.8  PHOS 2.4  --   --   --   --   --    Liver Function Tests:  Recent Labs Lab 10/13/12 1155 10/19/12 0600  AST  --  31  ALT  --  21  ALKPHOS  --  179*  BILITOT  --  1.8*  PROT  --  7.0  ALBUMIN 1.7* 1.9*   No results found for this basename: LIPASE, AMYLASE,  in the last 168 hours No results found for this basename: AMMONIA,  in the last 168 hours CBC:  Recent Labs Lab 10/15/12 0500 10/16/12 0335 10/19/12 0600  WBC 9.1 7.3 6.2  NEUTROABS  --   --  3.1  HGB 8.9* 8.8* 9.1*  HCT 26.9* 27.1* 27.7*  MCV 82.3 82.4 82.9  PLT 391 352 291   Cardiac Enzymes: No results found for this basename: CKTOTAL, CKMB, CKMBINDEX, TROPONINI,  in the last 168 hours BNP (last 3 results) No results found for this basename: PROBNP,  in the last 8760 hours CBG:  Recent Labs Lab 10/18/12 1157 10/18/12 1611 10/18/12 1648 10/18/12 1720 10/18/12 2100  GLUCAP 142* 60* 65* 78 134*    No results found for this or any previous visit (from the past 240 hour(s)).   Studies: No results found.  Scheduled Meds: . antiseptic oral rinse  15 mL Mouth Rinse BID  . aspirin  325 mg Oral BID  . darbepoetin (ARANESP) injection - NON-DIALYSIS  150 mcg Subcutaneous Q Mon-1800  . enoxaparin (LOVENOX) injection   40 mg Subcutaneous Q24H  . feeding supplement  237 mL Oral BID BM  . FLUoxetine  20 mg Oral Daily  . insulin aspart  0-9 Units Subcutaneous TID WC  . insulin detemir  10 Units Subcutaneous Daily  . metoCLOPramide (REGLAN) injection  10 mg Intravenous TID AC & HS  . metoprolol  5 mg Intravenous Q6H  . pantoprazole (PROTONIX) IV  40 mg Intravenous Q24H   Continuous Infusions: . dextrose 50 mL/hr at 10/18/12 2147    Principal Problem:   Gangrene of foot Left Active Problems:   H/O: CVA (cerebrovascular accident)   Hypertension   Anemia   DM (diabetes mellitus) type 2, uncontrolled, with ketoacidosis   Nausea with vomiting   Fever   UTI (lower urinary tract infection)   Wound eschar of foot   Major depressive disorder, recurrent episode, moderate    Time spent: 35  minutes    Allie Bossier  [facility] Pager [phone]. If 7PM-7AM, please contact night-coverage at [email or web address], password [facility] 10/19/2012, 9:27 AM  LOS: 23 days

## 2012-10-19 NOTE — Progress Notes (Signed)
Occupational Therapy Treatment Patient Details Name: [name] MRN: [id number] DOB: 08/17/1950 Today's Date: 10/19/2012 Time: [id number] OT Time Calculation (min): 17 min  OT Assessment / Plan / Recommendation  History of present illness pt presents with Bil BKA.     OT comments  Pt making progress, new goals added.  Follow Up Recommendations  SNF             Frequency Min 2X/week   Progress towards OT Goals Progress towards OT goals: Progressing toward goals  Plan Discharge plan remains appropriate    Precautions / Restrictions Precautions Precautions: Fall Restrictions Weight Bearing Restrictions: Yes RLE Weight Bearing: Weight bearing as tolerated LLE Weight Bearing: Non weight bearing Other Position/Activity Restrictions: bil BKA       ADL        ADL Goals Pt Will Perform Upper Body Bathing: with set-up;sitting (unsupported) Pt Will Perform Lower Body Bathing: with set-up;sitting/lateral leans;bed level Pt Will Perform Lower Body Dressing: with set-up;sitting/lateral leans;bed level  Visit Information  Last OT Received On: 10/19/12 Assistance Needed: +1 History of Present Illness: pt presents with Bil BKA.            Cognition  Cognition Arousal/Alertness: Awake/alert Behavior During Therapy: Flat affect Overall Cognitive Status: Within Functional Limits for tasks assessed    Mobility  Bed Mobility Bed Mobility: Supine to Sit;Sit to Supine Supine to Sit: 6: Modified independent (Device/Increase time);With rails Sit to Supine: 6: Modified independent (Device/Increase time);With rail Details for Bed Mobility Assistance: Pt is able to sit upright in long sitting and turn himself to the left and the right 90 degrees    Exercises  Other Exercises Other Exercises: Pt able to complete 10 reps each level 1 theraband while long sitting in bed: shoulder flexion/extension; shoulder ab/adduction;elbow flexion/extension with S. Need to increase to level 2  theraband next time working on this with him      End of Session OT - End of Session Activity Tolerance: Patient tolerated treatment well Patient left: in bed;with call bell/phone within reach       Almon Register [id number] 10/19/2012, 4:17 PM

## 2012-10-20 LAB — CBC WITH DIFFERENTIAL/PLATELET
Eosinophils Absolute: 0.1 10*3/uL (ref 0.0–0.7)
Eosinophils Relative: 2 % (ref 0–5)
Hemoglobin: 8.6 g/dL — ABNORMAL LOW (ref 13.0–17.0)
Lymphs Abs: 1.7 10*3/uL (ref 0.7–4.0)
MCH: 27 pg (ref 26.0–34.0)
MCV: 83.1 fL (ref 78.0–100.0)
Monocytes Relative: 18 % — ABNORMAL HIGH (ref 3–12)
RBC: 3.19 MIL/uL — ABNORMAL LOW (ref 4.22–5.81)

## 2012-10-20 LAB — COMPREHENSIVE METABOLIC PANEL
BUN: 13 mg/dL (ref 6–23)
Calcium: 8.3 mg/dL — ABNORMAL LOW (ref 8.4–10.5)
GFR calc Af Amer: 59 mL/min — ABNORMAL LOW (ref >90)
Glucose, Bld: 107 mg/dL — ABNORMAL HIGH (ref 70–99)
Total Protein: 6 g/dL (ref 6.0–8.3)

## 2012-10-20 LAB — GLUCOSE, CAPILLARY

## 2012-10-20 LAB — MAGNESIUM: Magnesium: 1.8 mg/dL (ref 1.5–2.5)

## 2012-10-20 MED ORDER — METOPROLOL TARTRATE 25 MG PO TABS: 50.0000 mg | ORAL_TABLET | Freq: Two times a day (BID) | ORAL | Status: DC

## 2012-10-20 MED ORDER — POTASSIUM CHLORIDE 10 MEQ/100ML IV SOLN
10.0000 meq | Freq: Once | INTRAVENOUS | Status: AC
  Administered 2012-10-20: 10 meq via INTRAVENOUS
  Filled 2012-10-20: qty 100

## 2012-10-20 MED ORDER — METOPROLOL TARTRATE 50 MG PO TABS
50.0000 mg | ORAL_TABLET | Freq: Two times a day (BID) | ORAL | Status: DC
  Filled 2012-10-20 (×2): qty 1

## 2012-10-20 MED ORDER — ASPIRIN 325 MG PO TABS: 325.0000 mg | ORAL_TABLET | Freq: Two times a day (BID) | ORAL | Status: DC

## 2012-10-20 MED ORDER — METOCLOPRAMIDE HCL 5 MG/ML IJ SOLN: 10.0000 mg | Freq: Three times a day (TID) | INTRAMUSCULAR | Status: DC

## 2012-10-20 MED ORDER — DARBEPOETIN ALFA-POLYSORBATE 150 MCG/0.3ML IJ SOLN: 150.0000 ug | INTRAMUSCULAR | Status: DC

## 2012-10-20 MED ORDER — METOPROLOL TARTRATE 50 MG PO TABS: 50.0000 mg | ORAL_TABLET | Freq: Two times a day (BID) | ORAL | Status: DC

## 2012-10-20 MED ORDER — OXYCODONE HCL 5 MG PO TABS
5.0000 mg | ORAL_TABLET | ORAL | Status: DC | PRN
  Administered 2012-10-20: 10 mg via ORAL
  Filled 2012-10-20: qty 2

## 2012-10-20 MED ORDER — FLUOXETINE HCL 40 MG PO CAPS: 40.0000 mg | ORAL_CAPSULE | Freq: Every day | ORAL | Status: DC

## 2012-10-20 MED ORDER — HYDRALAZINE HCL 20 MG/ML IJ SOLN: 5.0000 mg | Freq: Four times a day (QID) | INTRAMUSCULAR | Status: DC | PRN

## 2012-10-20 MED ORDER — MEGESTROL ACETATE 40 MG/ML PO SUSP: 400.0000 mg | Freq: Every day | ORAL | Status: DC

## 2012-10-20 MED ORDER — OXYCODONE HCL 5 MG PO TABS: 5.0000 mg | ORAL_TABLET | ORAL | Status: DC | PRN

## 2012-10-20 NOTE — Progress Notes (Signed)
Patient for d/c today back  to SNF bed at [facility]. Patient agreeable to this plan- plan transfer via EMS. Eduard Clos, MSW [phone]

## 2012-10-20 NOTE — Progress Notes (Deleted)
Patient for d/c today to SNF bed at [facility] as pta. Patient agreeable to this plan- plan transfer via EMS. Eduard Clos, MSW [phone]

## 2012-10-20 NOTE — Discharge Summary (Addendum)
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 Patient now bilateral BKA   N/V-resolved with Reglan patient still not eating properly   Hypernatremia  -resolved we'll DC D5W in preparation for discharge Na= 136 mEq/L this a.m.  Hypokalemia  Resolved, however not within guidelines for cardiac patient will treat with one dose 10 meq IV potassium prior to discharge (goal> 4.0 mEq/L) eplete orally.  Leukocytosis-resolved    Severe malnutrition-discharge patient on Megace 40 mg daily   CHF: Chronic systolic CHF with LVEF [id number] last checked on my review on January of 2009. This is compensated, no symptoms of CHF.   DM type 2-Blood sugars 157-186-continue Levemir 10 Units daily and sensitive Scale coverage-sugars 130-180 and controlled. Patient is S/P EGD on 10/08/2012 believe all symptoms are secondary to diabetic gastroparesis continue metoclopramide.   Anemia Continue Aranesp. Patient counseled on need to improve his nutrition  AKI-stabilising-creat trending downwards. Current creatinine = 1.43 this appears to be patient's new baseline .    H/o CVA-continue ASA 325 daily   Htn-D/Ced metoprolol 5 mg IV q6h, and started on Metoprolol 50 mg BID   Depression; continue Prozac to 40 mg daily   Discharge Diagnoses:  Principal Problem:   Gangrene of foot Left Active Problems:   H/O: CVA (cerebrovascular accident)   Hypertension   Anemia   DM (diabetes mellitus) type 2, uncontrolled, with ketoacidosis   Nausea with vomiting   Fever   UTI (lower urinary tract infection)   Wound eschar of foot   Major depressive disorder, recurrent episode, moderate   Discharge Condition: Stable  Diet recommendation:Heart Healthy  [address] [facility] Weights    10/17/12 2103 10/18/12 2143 10/19/12 2155  Weight: 64.501 kg (142 lb 3.2 oz) 64.501 kg (142 lb 3.2 oz) 61.4 kg (135 lb 5.8 oz)    History of present illness:  Norval Larick is a 62 y.o.BM PMHx hypertension, diabetes, CVA, depression, chronic kidney disease baseline creatinine 1.2- 1.4, gastroesophageal reflux disease, history of gangrenous foot status post right BKA 09/06/2012 who presents to the ED with a three-day history of nausea and emesis. Patient endorses some fevers, and chills. Patient denies any shortness of breath, no chest pain, nondominant pain, no dysuria, no diarrhea, no constipation, no weakness.  Patient also notes some warmth and slight erythema in the left lower extremity that has superficial lesions. Patient denies any discharge from his left lower extremity.  Patient is seen in the ED comprehensive metabolic profile done had a creatinine of 1.68 glucose of 129 alk phosphatase of 203 albumin of 1.8 otherwise was within normal limits. CBC had a white count of 16.5 hemoglobin of 7.5 and a platelet count of 374. Urinalysis done had moderate leukocytes 7-10 white blood cells. Chest x-ray was negative for any infiltrates. X-ray of the right knee showed no acute abnormality. We will called to admit the patient for further evaluation and management.  It was noted that patient had presented from his orthopod's office, Dr. Damita Dunnings office to the ED. I spoke with Dr. Grandville Silos from Dr. Damita Dunnings office who was on call and was told that the hospitalist service had been called to admit this patient in preparation for surgery for amputation of the left lower extremity completed on  09/27/2012 .TODAY patient answers all questions with a nod  or brief answer, sister states he's always been somewhat withdrawn but this is increased for him   Procedures: right BKA 09/06/2012  left BKA   09/27/2012    Consultations:    Discharge Exam: Filed Vitals:   10/20/12 0003 10/20/12 0549 10/20/12 0745  10/20/12 1035  BP: 104/60 113/65 99/55 125/66  Pulse: 60 68 65 64  Temp:  98.3 F (36.8 C) 97.6 F (36.4 C)   TempSrc:  Oral Oral   Resp:  18 18   Height:      Weight:      SpO2:  98% 98%    General: Alert and awake, oriented x3, not in any acute distress.  HEENT: anicteric sclera, pupils reactive to light and accommodation, EOMI  CVS: S1-S2 clear, no murmur rubs or gallops  Chest: clear to auscultation bilaterally, no wheezing, rales or rhonchi  Abdomen: soft nontender, nondistended, normal bowel sounds, no organomegaly  Extremities: Bilateral BKA  Neuro: Cranial nerves II-XII intact, no focal neurological deficits  Discharge Instructions     Medication List    STOP taking these medications       piperacillin-tazobactam 3.375 GM/50ML IVPB  Commonly known as:  ZOSYN     sodium chloride 0.9 % injection     sodium chloride 0.9 % SOLN 500 mL with vancomycin 10 G SOLR 1,500 mg      TAKE these medications       acetaminophen 325 MG tablet  Commonly known as:  TYLENOL  Take 2 tablets (650 mg total) by mouth every 6 (six) hours as needed.     aspirin 325 MG tablet  Take 1 tablet (325 mg total) by mouth 2 (two) times daily.     aspirin 325 MG tablet  Take 1 tablet (325 mg total) by mouth 2 (two) times daily.     darbepoetin 150 MCG/0.3ML Soln injection  Commonly known as:  ARANESP  Inject 0.3 mL (150 mcg total) into the skin every Monday at 6 PM.     FLUoxetine 40 MG capsule  Commonly known as:  PROZAC  Take 1 capsule (40 mg total) by mouth daily.     hydrALAZINE 50 MG tablet  Commonly known as:  APRESOLINE  Take 1 tablet (50 mg total) by mouth every 8 (eight) hours.     hydrALAZINE 20 MG/ML injection  Commonly known as:  APRESOLINE  Inject 0.25 mL (5 mg total) into the vein every 6 (six) hours as needed (for SBP greater than 170 or DBP greater than 100).     insulin aspart 100 UNIT/ML injection  Commonly known as:  novoLOG  Inject 0-9 Units into the skin 3  (three) times daily with meals. CBG 70 - 120: 0 units CBG 121 - 150: 1 unit,  CBG 151 - 200: 2 units,  CBG 201 - 250: 3 units,  CBG 251 - 300: 5 units,  CBG 301 - 350: 7 units,  CBG 351 - 400: 9 units   CBG > 400: 9 units and notify your MD     insulin aspart 100 UNIT/ML injection  Commonly known as:  novoLOG  Inject 4 Units into the skin 3 (three) times daily with meals.     insulin detemir 100 UNIT/ML injection  Commonly known as:  LEVEMIR  Inject 0.15 mLs (15 Units total) into the skin at bedtime.     megestrol 40 MG/ML suspension  Commonly known as:  MEGACE  Take 10 mL (400 mg total) by mouth daily.  metoCLOPramide 5 MG tablet  Commonly known as:  REGLAN  Take 1 tablet (5 mg total) by mouth 3 (three) times daily before meals.     metoCLOPramide 5 MG/ML injection  Commonly known as:  REGLAN  Inject 2 mL (10 mg total) into the vein 4 (four) times daily -  before meals and at bedtime.     metoprolol tartrate 25 MG tablet  Commonly known as:  LOPRESSOR  Take 2 tablets (50 mg total) by mouth 2 (two) times daily.     metoprolol 50 MG tablet  Commonly known as:  LOPRESSOR  Take 1 tablet (50 mg total) by mouth 2 (two) times daily.     ondansetron 4 MG tablet  Commonly known as:  ZOFRAN  Take 1 tablet (4 mg total) by mouth every 6 (six) hours as needed for nausea.     oxyCODONE 5 MG immediate release tablet  Commonly known as:  Oxy IR/ROXICODONE  Take 1-2 tablets (5-10 mg total) by mouth every 4 (four) hours as needed.     pantoprazole 40 MG tablet  Commonly known as:  PROTONIX  Take 1 tablet (40 mg total) by mouth daily at 6 (six) AM.     polyethylene glycol packet  Commonly known as:  MIRALAX / GLYCOLAX  Take 17 g by mouth daily.       No Known Allergies Follow-up Information   Follow up with No PCP Per Patient In 7 days.   Specialty:  General Practice   Contact information:   [place] [address] [phone]       Follow up with Kerin Salen, MD. Schedule an appointment as soon as possible for a visit in 7 days.   Specialty:  Orthopedic Surgery   Contact information:   [place] [address] [phone]       Schedule an appointment as soon as possible for a visit with Starla Link, RN.   Contact information:   [place] - [place] [address] [phone]        The results of significant diagnostics from this hospitalization (including imaging, microbiology, ancillary and laboratory) are listed below for reference.    Significant Diagnostic Studies: Ct Abdomen Pelvis Wo Contrast  10/11/2012   *RADIOLOGY REPORT*  Clinical Data: Nausea/vomiting, evaluate bowel obstruction  CT ABDOMEN AND PELVIS WITHOUT CONTRAST  Technique:  Multidetector CT imaging of the abdomen and pelvis was performed following the standard protocol without intravenous contrast.  Comparison: None.  Findings: Evaluation is obscured by streak artifact from the patient's arms.  Small bilateral pleural effusions, right greater than left. Associated lower lobe opacities, likely atelectasis.  Enteric tube terminates in the distal gastric body.  Unenhanced liver, spleen, pancreas, and left adrenal gland are within normal limits.  2.0 cm right adrenal nodule (series 2/image 24), poorly evaluated.  Gallbladder is mildly distended with suspected sludge/noncalcified gallstones.  No intrahepatic or extrahepatic ductal dilatation.  Kidneys are unremarkable.  No renal calculi or hydronephrosis.  No evidence of bowel obstruction.  Normal appendix.  Atherosclerotic calcifications of the abdominal aorta and branch vessels.  No abdominopelvic ascites.  No gross abdominopelvic lymphadenopathy.  Prostate is unremarkable.  Bladder is within normal limits.  Body wall edema.  Subcutaneous gas in the left flank likely reflects injection site (series 2/image 39).  Degenerative changes of the visualized thoracolumbar spine.  IMPRESSION: No evidence of bowel obstruction.  Normal  appendix.  Distended gallbladder with suspected sludge/noncalcified gallstones.  2.0  cm right adrenal nodule, statistically likely reflecting a benign adenoma, although poorly evaluated.  Consider follow-up MRI abdomen without contrast as an outpatient for further characterization.  Small bilateral pleural effusions with body wall edema.   Original Report Authenticated By: Julian Hy, M.D.   Dg Chest 2 View  09/26/2012   *RADIOLOGY REPORT*  Clinical Data: Chest pain.  CHEST - 2 VIEW  Comparison: 09/07/2012  Findings: A right upper extremity PICC line is present with the tip in the lower SVC.  The heart size is normal and stable with evidence of prior CABG.  Lungs show no evidence of edema, focal consolidation or nodule.  No pleural fluid is identified.  Bony thorax is unremarkable.  IMPRESSION: No active disease.   Original Report Authenticated By: Aletta Edouard, M.D.   Dg Femur Right  09/26/2012   *RADIOLOGY REPORT*  Clinical Data: Pain of the mid right femur.  No known trauma.  RIGHT FEMUR - 2 VIEW  Comparison: Right foot radiographs 07/27/2012  Findings: The joint space of the right hip is maintained.  No significant degenerative changes.  Bony mineralization appears normal.  No fracture or focal bony abnormality of the femur is seen.  Inferior patellar osteophyte noted.  On the lateral view, surgical resection of the distal aspect of the fibula is noted.  Only 6.5 cm of the proximal fibula are present.  There is mild subcutaneous stranding of the visualized portion of the right lower extremity.  IMPRESSION:  1.  No acute bony abnormality the right femur. 2. Below-the-knee amputation of the fibula noted.  The imaged portion of the proximal tibia continues below the edge of the image. 3.  Suspect mild diffuse subcutaneous edema.   Original Report Authenticated By: Curlene Dolphin, M.D.   Nm Hepatobiliary Liver Func  10/12/2012   *RADIOLOGY REPORT*  Clinical Data:  Abdominal pain and nausea vomiting.   Gallbladder distended with suspected gallstones/sludge on a current CT.  NUCLEAR MEDICINE HEPATOBILIARY IMAGING  Technique:  Sequential images of the abdomen were obtained out to 60 minutes following intravenous administration of radiopharmaceutical. 3 mg of morphine intravenously was given at 80 minutes following the injection of radiotracer to promote gallbladder visualization.  Radiopharmaceutical:  12mCi Tc-65m Choletec  Comparison:  CT, 10/11/2012  Findings: The liver shows prompt and homogeneous accumulation of radiotracer.  No liver defect is seen.  There is prompt excretion into the intra and extrahepatic biliary tree with small bowel activity seen early during the exam indicating patency of the common bile duct.  The gallbladder was not seen on initial imaging.  However, following morphine, gallbladder activity was documented indicating patency of the cystic duct.  IMPRESSION: No evidence of acute cholecystitis.  Gallbladder filling was sluggish, necessitating intravenous morphine administration, but the cystic duct is patent.  Normal appearance of the liver.  Patent common bile duct.   Original Report Authenticated By: Lajean Manes, M.D.   US Abdomen Complete  10/08/2012   *RADIOLOGY REPORT*  Clinical Data:  Persistent nausea/vomiting  COMPLETE ABDOMINAL ULTRASOUND  Comparison:  None.  Findings:  Gallbladder:  Gallbladder is mildly distended with gallbladder sludge and/or small stones.  Gallbladder wall of the upper limits of normal, measuring 4 mm.  Negative sonographic Murphy's sign.  Common bile duct:  Measures 4 mm.  Liver:  No focal lesion identified.  Within normal limits in parenchymal echogenicity.  IVC:  Poorly visualized.  Pancreas:  Not visualized due to overlying bowel gas.  Spleen:  Measures 5.0 cm.  Right Kidney:  Measures 12.6 cm.  Echogenic renal parenchyma.  No hydronephrosis.  Left Kidney:  Measures 11.6 cm.  Echogenic renal parenchyma.  No hydronephrosis.  Abdominal aorta:  No aneurysm  identified.  Additional comments:  Small bilateral pleural effusions.  IMPRESSION: Mildly distended gallbladder with gallbladder sludge and/or small stones.  No associated sonographic findings suggest acute cholecystitis.  Echogenic renal parenchyma, suggesting medical renal disease.  No hydronephrosis.  Small bilateral pleural effusions.   Original Report Authenticated By: Julian Hy, M.D.   US Renal  09/27/2012   *RADIOLOGY REPORT*  Clinical Data: Progressive renal insufficiency.  Question obstruction.  RENAL/URINARY TRACT ULTRASOUND COMPLETE  Comparison:  Renal ultrasound 09/08/2012.  Findings:  Right Kidney:  The renal parenchyma is diffusely echogenic.  No focal abnormality or hydronephrosis is demonstrated.  Renal length 13.6 cm (previously 12.5 cm).  Left Kidney:  The renal parenchyma appears mildly echogenic without focal abnormality.  There is no hydronephrosis.  Renal length is 12.4 cm (previously 12.3 cm).  Bladder:  Bilateral ureteral jets are noted.  There is some layering debris within the bladder lumen.  The prostate gland is noted to the mildly enlarged.  IMPRESSION:  1.  No hydronephrosis. 2.  Bilateral echogenic kidneys consistent with medical renal disease.  The right kidney currently measures larger than on the prior study from 3 weeks ago.  This difference could be technical. The possibility of acute tubular necrosis or other acute inflammatory process should be considered.   Original Report Authenticated By: Richardean Sale, M.D.   Dg Chest Port 1 View  10/10/2012   *RADIOLOGY REPORT*  Clinical Data: Cough.  Elevated white blood cell count.  PORTABLE CHEST - 1 VIEW  Comparison: 10/07/2012 acute abdomen series  Findings: Right central line terminates at the mid to low SVC.  Moderate cardiomegaly. Prior median sternotomy.  Probable layering bilateral pleural effusions, small. No pneumothorax.  Development of interstitial and airspace disease, greater right than left. Lower lobe  predominant.  IMPRESSION: Development of congestive heart failure with bilateral pleural effusions.   Original Report Authenticated By: Abigail Miyamoto, M.D.   Dg Chest Port 1 View  10/01/2012   *RADIOLOGY REPORT*  Clinical Data: Fever.  PORTABLE CHEST - 1 VIEW  Comparison: 09/26/2012  Findings: Stable positioning of PICC line in the SVC.  No edema, infiltrate or pleural fluid is seen.  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  IMPRESSION: No active disease.   Original Report Authenticated By: Aletta Edouard, M.D.   Dg Knee Complete 4 Views Right  09/26/2012   *RADIOLOGY REPORT*  Clinical Data: History of painful right knee.  No new injury.  RIGHT KNEE - COMPLETE 4+ VIEW  Comparison: 09/24/2012 study.  Findings: The patient has undergone previous below-the-knee amputation.  Surgical clip is seen posterior to the proximal fibula on the lateral image.  There is degenerative spurring of the posterior inferior aspect of the patella.  No fracture or bony destruction is seen.  Joint spaces appear relatively well preserved. No chondrocalcinosis is evident.  IMPRESSION: No acute abnormality is identified.  Post below-the-knee amputation.  Degenerative spurring of patella.   Original Report Authenticated By: Shanon Brow Call   Dg Abd Acute W/chest  10/07/2012   *RADIOLOGY REPORT*  Clinical Data: Nausea, vomiting.  Epigastric pain.  ACUTE ABDOMEN SERIES (ABDOMEN 2 VIEW & CHEST 1 VIEW)  Comparison: 10/01/2012  Findings: Prior CABG.  Right PICC line is unchanged.  Cardiomegaly. No confluent opacities or effusions.  Nonobstructive bowel gas pattern.  No free air.  No organomegaly or suspicious calcification.  No acute bony abnormality. Old, possibly healing left sixth and seventh rib fractures, unchanged.  IMPRESSION: Cardiomegaly.  Prior CABG.  No acute cardiopulmonary disease.  No evidence of bowel obstruction or free air.   Original Report Authenticated By: Rolm Baptise, M.D.    Microbiology: Recent Results (from the  past 240 hour(s))  CLOSTRIDIUM DIFFICILE BY PCR     Status: None   Collection Time    10/19/12  3:01 PM      Result Value Range Status   C difficile by pcr NEGATIVE  NEGATIVE Final     Labs: Basic Metabolic Panel:  Recent Labs Lab 10/13/12 1155  10/16/12 0335 10/17/12 0517 10/18/12 0400 10/19/12 0600 10/20/12 0510  NA 152*  < > 146* 146* 147* 140 136  K 3.0*  < > 3.4* 4.3 4.2 4.0 3.6  CL 117*  < > 113* 114* 115* 106 102  CO2 27  < > 24 23 24 23 23   GLUCOSE 183*  < > 100* 165* 115* 108* 107*  BUN 18  < > 17 16 14 13 13   CREATININE 1.66*  < > 1.55* 1.53* 1.44* 1.40* 1.43*  CALCIUM 8.7  < > 8.2* 8.6 8.7 8.7 8.3*  MG  --   --  2.0  --   --  1.8 1.8  PHOS 2.4  --   --   --   --   --   --   < > = values in this interval not displayed. Liver Function Tests:  Recent Labs Lab 10/13/12 1155 10/19/12 0600 10/20/12 0510  AST  --  31 21  ALT  --  21 17  ALKPHOS  --  179* 159*  BILITOT  --  1.8* 1.5*  PROT  --  7.0 6.0  ALBUMIN 1.7* 1.9* 1.7*   No results found for this basename: LIPASE, AMYLASE,  in the last 168 hours No results found for this basename: AMMONIA,  in the last 168 hours CBC:  Recent Labs Lab 10/15/12 0500 10/16/12 0335 10/19/12 0600 10/20/12 0510  WBC 9.1 7.3 6.2 4.7  NEUTROABS  --   --  3.1 2.1  HGB 8.9* 8.8* 9.1* 8.6*  HCT 26.9* 27.1* 27.7* 26.5*  MCV 82.3 82.4 82.9 83.1  PLT 391 352 291 249   Cardiac Enzymes: No results found for this basename: CKTOTAL, CKMB, CKMBINDEX, TROPONINI,  in the last 168 hours BNP: BNP (last 3 results) No results found for this basename: PROBNP,  in the last 8760 hours CBG:  Recent Labs Lab 10/19/12 1229 10/19/12 1647 10/19/12 2159 10/19/12 2251 10/20/12 0759  GLUCAP 193* 151* 66* 89 123*       Signed:  Bernardo Brayman, J  [facility] 10/20/2012, 10:40 AM

## 2012-10-20 NOTE — Progress Notes (Signed)
Patient transferred to [facility] by [facility]. Report given to RN Amalia Hailey at [facility].  PICC line removed prior to discharge.  Patient without complaints at time of discharge.

## 2012-10-20 NOTE — Progress Notes (Signed)
Patient states that he gave his cell phone to a church member by the name of Mother Altha Harm.

## 2012-10-24 ENCOUNTER — Non-Acute Institutional Stay (SKILLED_NURSING_FACILITY): Payer: Medicare Other | Admitting: Internal Medicine

## 2012-10-24 DIAGNOSIS — N179 Acute kidney failure, unspecified: Secondary | ICD-10-CM

## 2012-10-24 DIAGNOSIS — K3184 Gastroparesis: Secondary | ICD-10-CM

## 2012-10-24 DIAGNOSIS — R630 Anorexia: Secondary | ICD-10-CM

## 2012-10-24 DIAGNOSIS — IMO0002 Reserved for concepts with insufficient information to code with codable children: Secondary | ICD-10-CM

## 2012-10-24 DIAGNOSIS — I96 Gangrene, not elsewhere classified: Secondary | ICD-10-CM

## 2012-10-24 DIAGNOSIS — E1149 Type 2 diabetes mellitus with other diabetic neurological complication: Secondary | ICD-10-CM

## 2012-10-24 DIAGNOSIS — E1165 Type 2 diabetes mellitus with hyperglycemia: Secondary | ICD-10-CM

## 2012-10-24 DIAGNOSIS — I5022 Chronic systolic (congestive) heart failure: Secondary | ICD-10-CM

## 2012-10-24 DIAGNOSIS — F331 Major depressive disorder, recurrent, moderate: Secondary | ICD-10-CM

## 2012-10-24 DIAGNOSIS — E1143 Type 2 diabetes mellitus with diabetic autonomic (poly)neuropathy: Secondary | ICD-10-CM

## 2012-10-24 DIAGNOSIS — D649 Anemia, unspecified: Secondary | ICD-10-CM

## 2012-10-24 DIAGNOSIS — Z8673 Personal history of transient ischemic attack (TIA), and cerebral infarction without residual deficits: Secondary | ICD-10-CM

## 2012-10-24 MED ORDER — MEGESTROL ACETATE 40 MG/ML PO SUSP: 400.0000 mg | Freq: Every day | ORAL | Status: AC

## 2012-10-24 NOTE — Progress Notes (Signed)
Patient ID: [name], male   DOB: Jun 13, 1950, 62 y.o.   MRN: [id number] Provider:  Rexene Edison. Mariea Clonts, D.O., C.M.D. Location:  [facility]  PCP: Boneta Lucks, MD  Code Status: full code   No Known Allergies  Chief Complaint  Patient presents with  . Hospitalization Follow-up    HPI: 62 y.o. 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 Damita Dunnings office 09/26/12--orthopedics with plans for admission and left LE amputation).    He underwent LLE BKA on 09/27/12.  He required fluids due to hypernatremia and mild acute renal failure.  He required potassium repletion.  His leukocytosis resolved.  He had compensation of his chronic systolic CHF.  His DMII is controlled well with levemir 10 units and 4 units of novolog meal coverage. He continued to have difficulty with nausea and vomiting that improved with reglan, but his appetite remained poor.   He underwent an EGD on 10/08/12.  It was felt that his nausea, vomiting was due to diabetic gastroparesis as nothing else significant was identified.  He remains on aranesp for his anemia.      ROS: Review of Systems  Constitutional: Positive for malaise/fatigue. Negative for fever.  Eyes: Negative for blurred vision.  Respiratory: Negative for shortness of breath.   Cardiovascular: Negative for chest pain and leg swelling.  Gastrointestinal: Positive for heartburn. Negative for nausea, abdominal pain, constipation, blood in stool and melena.  Genitourinary: Negative for dysuria.  Musculoskeletal: Negative for falls and myalgias.  Neurological: Negative for dizziness and headaches.  Endo/Heme/Allergies: Bruises/bleeds easily.  Psychiatric/Behavioral: Positive for depression and memory loss.     Past Medical  History  Diagnosis Date  . Hypertension   . 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 Myocardial infarction   . Hx of seasonal allergies   . GERD (gastroesophageal reflux disease)   . High cholesterol   . Exertional shortness of breath     "before my last OR; I'm fine now" (09/26/2012)  . Type II diabetes mellitus    Past Surgical History  Procedure Laterality Date  . 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 Amputation Right 07/29/2012    Procedure: AMPUTATION 3RD TOE;  Surgeon: Kerin Salen, MD;  Location: [place];  Service: Orthopedics;  Laterality: Right;  right third toe amputation  . Amputation Right 08/02/2012    Procedure: right transmetatarsal amputation;  Surgeon: Kerin Salen, MD;  Location: [place];  Service: Orthopedics;  Laterality: Right;  . Cataract extraction w/ intraocular lens  implant, bilateral  ?2010  . Amputation Right 09/06/2012    Procedure: AMPUTATION BELOW KNEE;  Surgeon: Kerin Salen, MD;  Location: [place];  Service: Orthopedics;  Laterality: Right;  . Peripherally inserted central catheter insertion Right 09/2012    upper arm  . Amputation Left 09/27/2012    Procedure: AMPUTATION BELOW KNEE;  Surgeon: Kerin Salen, MD;  Location: [place];  Service: Orthopedics;  Laterality: Left;  . Esophagogastroduodenoscopy N/A 10/08/2012    Procedure: ESOPHAGOGASTRODUODENOSCOPY (EGD);  Surgeon: Winfield Cunas., MD;  Location: [facility] ENDOSCOPY;  Service: Endoscopy;  Laterality: N/A;   Social History:   reports that he has quit smoking. His smoking use included Cigarettes. He has a 2.4 pack-year  smoking history. He has never used smokeless tobacco. He reports that he does not drink alcohol or use illicit drugs.  No family history on file.  Medications: Patient's Medications  New Prescriptions   No medications on file  Previous Medications   ACETAMINOPHEN (TYLENOL) 325 MG TABLET    Take 2 tablets (650 mg total) by mouth every 6 (six) hours as needed.   ASPIRIN 325 MG TABLET     Take 1 tablet (325 mg total) by mouth 2 (two) times daily.   DARBEPOETIN (ARANESP) 150 MCG/0.3ML SOLN INJECTION    Inject 0.3 mL (150 mcg total) into the skin every Monday at 6 PM.   FLUOXETINE (PROZAC) 40 MG CAPSULE    Take 1 capsule (40 mg total) by mouth daily.   HYDRALAZINE (APRESOLINE) 50 MG TABLET    Take 1 tablet (50 mg total) by mouth every 8 (eight) hours.   INSULIN ASPART (NOVOLOG) 100 UNIT/ML INJECTION    Inject 4 Units into the skin 3 (three) times daily with meals.   INSULIN DETEMIR (LEVEMIR) 100 UNIT/ML INJECTION    Inject 0.15 mLs (15 Units total) into the skin at bedtime.   METOCLOPRAMIDE (REGLAN) 5 MG TABLET    Take 5 mg by mouth 2 (two) times daily before a meal.   METOPROLOL (LOPRESSOR) 50 MG TABLET    Take 1 tablet (50 mg total) by mouth 2 (two) times daily.   ONDANSETRON (ZOFRAN) 4 MG TABLET    Take 1 tablet (4 mg total) by mouth every 6 (six) hours as needed for nausea.   OXYCODONE (OXY IR/ROXICODONE) 5 MG IMMEDIATE RELEASE TABLET    Take 1-2 tablets (5-10 mg total) by mouth every 4 (four) hours as needed.   PANTOPRAZOLE (PROTONIX) 40 MG TABLET    Take 1 tablet (40 mg total) by mouth daily at 6 (six) AM.   POLYETHYLENE GLYCOL (MIRALAX / GLYCOLAX) PACKET    Take 17 g by mouth daily.   PROMETHAZINE (PHENERGAN) 25 MG SUPPOSITORY    Place 25 mg rectally every 6 (six) hours as needed for nausea.  Modified Medications   No medications on file  Discontinued Medications   ASPIRIN 325 MG TABLET    Take 1 tablet (325 mg total) by mouth 2 (two) times daily.   HYDRALAZINE (APRESOLINE) 20 MG/ML INJECTION    Inject 0.25 mL (5 mg total) into the vein every 6 (six) hours as needed (for SBP greater than 170 or DBP greater than 100).   INSULIN ASPART (NOVOLOG) 100 UNIT/ML INJECTION    Inject 0-9 Units into the skin 3 (three) times daily with meals. CBG 70 - 120: 0 units CBG 121 - 150: 1 unit,  CBG 151 - 200: 2 units,  CBG 201 - 250: 3 units,  CBG 251 - 300: 5 units,  CBG 301 - 350: 7 units,   CBG 351 - 400: 9 units   CBG > 400: 9 units and notify your MD   MEGESTROL (MEGACE) 40 MG/ML SUSPENSION    Take 10 mL (400 mg total) by mouth daily.   METOCLOPRAMIDE (REGLAN) 5 MG TABLET    Take 1 tablet (5 mg total) by mouth 3 (three) times daily before meals.   METOCLOPRAMIDE (REGLAN) 5 MG/ML INJECTION    Inject 2 mL (10 mg total) into the vein 4 (four) times daily -  before meals and at bedtime.   METOPROLOL TARTRATE (LOPRESSOR) 25 MG TABLET    Take 2 tablets (50 mg total) by mouth  2 (two) times daily.     Physical Exam: Filed Vitals:   04/06/13 1456  BP: 120/66  Pulse: 59  Temp: 98.4 F (36.9 C)  Resp: 20  Height: 5\' 11"  (1.803 m)  Weight: 137 lb (62.143 kg)  SpO2: 98%   Physical Exam  Constitutional: He appears well-developed and well-nourished.  HENT:  Head: Normocephalic and atraumatic.  Right Ear: External ear normal.  Left Ear: External ear normal.  Nose: Nose normal.  Mouth/Throat: Oropharynx is clear and moist.  Eyes: EOM are normal. Pupils are equal, round, and reactive to light.  Neck: Normal range of motion. Neck supple. No JVD present.  Cardiovascular: Normal rate, regular rhythm, normal heart sounds and intact distal pulses.   Pulmonary/Chest: Effort normal and breath sounds normal. He has no rales.  Abdominal: Soft. Bowel sounds are normal. He exhibits no distension and no mass. There is no tenderness.  Musculoskeletal:  BKA bilaterally  Neurological: He is alert. No cranial nerve deficit.  Pleasantly confused  Psychiatric: He has a normal mood and affect.     Labs reviewed: Basic Metabolic Panel:  Recent Labs  10/07/12 0555 10/08/12 0500  10/13/12 1155  10/16/12 0335  10/18/12 0400 10/19/12 0600 10/20/12 0510  NA 146* 146*  < > 152*  < > 146*  < > 147* 140 136  K 3.3* 3.7  < > 3.0*  < > 3.4*  < > 4.2 4.0 3.6  CL 110 111  < > 117*  < > 113*  < > 115* 106 102  CO2 18* 17*  < > 27  < > 24  < > 24 23 23   GLUCOSE 182* 212*  < > 183*  < > 100*  < >  115* 108* 107*  BUN 33* 30*  < > 18  < > 17  < > 14 13 13   CREATININE 3.12* 2.80*  < > 1.66*  < > 1.55*  < > 1.44* 1.40* 1.43*  CALCIUM 8.9 9.0  < > 8.7  < > 8.2*  < > 8.7 8.7 8.3*  MG  --   --   < >  --   --  2.0  --   --  1.8 1.8  PHOS 5.2* 4.8*  --  2.4  --   --   --   --   --   --   < > = values in this interval not displayed. Liver Function Tests:  Recent Labs  10/12/12 0530 10/13/12 1155 10/19/12 0600 10/20/12 0510  AST 22  --  31 21  ALT 19  --  21 17  ALKPHOS 177*  --  179* 159*  BILITOT 1.6*  --  1.8* 1.5*  PROT 7.5  --  7.0 6.0  ALBUMIN 1.7* 1.7* 1.9* 1.7*    Recent Labs  09/26/12 1421  LIPASE 27    Recent Labs  08/08/12 1145  AMMONIA 19   CBC:  Recent Labs  10/12/12 0530  10/16/12 0335 10/19/12 0600 10/20/12 0510  WBC 13.7*  < > 7.3 6.2 4.7  NEUTROABS 10.7*  --   --  3.1 2.1  HGB 8.4*  < > 8.8* 9.1* 8.6*  HCT 25.5*  < > 27.1* 27.7* 26.5*  MCV 82.5  < > 82.4 82.9 83.1  PLT 447*  < > 352 291 249  < > = values in this interval not displayed. Cardiac Enzymes:  Recent Labs  09/10/12 0430  CKTOTAL 35  CBG:  Recent Labs  10/19/12 2251 10/20/12 0759 10/20/12 1129  GLUCAP 89 123* 140*   Assessment/Plan 1. Diabetes mellitus type 2, uncontrolled, with complications -cont levemir with novolog meal coverage  2. Acute kidney injury -f/u bmp  3. Anemia -cont aranesp injections, f/u cbc  4. Gangrene of foot Left -s/p left bka, cont wound care  5. H/O: CVA (cerebrovascular accident) -requires ADL assistance due to this, cont asa 325mg   6. Major depressive disorder, recurrent episode, moderate -cont prozac  7. Poor appetite -improved since reglan added for gastroparesis -megestrol (MEGACE) 40 MG/ML suspension; Take 10 mL (400 mg total) by mouth daily.  Dispense: 240 mL; Refill: 0-->stop this after 6 wks b/c it likely will not work   8. Chronic systolic heart failure -cont metoprolol, hydralazine, asa, not on diuretics at this time  9.  Diabetic gastroparesis associated with type 2 diabetes mellitus -cont reglan for this and encourage small meals to avoid nausea and vomiting; cont prn phenergan  Labs/tests ordered:  Cbc, bmp next draw

## 2012-10-26 LAB — HEAVY METALS SCREEN, URINE

## 2012-11-21 ENCOUNTER — Encounter: Payer: Self-pay | Admitting: Internal Medicine

## 2012-11-21 ENCOUNTER — Non-Acute Institutional Stay (SKILLED_NURSING_FACILITY): Payer: Medicare Other | Admitting: Internal Medicine

## 2012-11-21 DIAGNOSIS — K3184 Gastroparesis: Secondary | ICD-10-CM

## 2012-11-21 DIAGNOSIS — I5022 Chronic systolic (congestive) heart failure: Secondary | ICD-10-CM

## 2012-11-21 DIAGNOSIS — E1165 Type 2 diabetes mellitus with hyperglycemia: Secondary | ICD-10-CM

## 2012-11-21 DIAGNOSIS — E1143 Type 2 diabetes mellitus with diabetic autonomic (poly)neuropathy: Secondary | ICD-10-CM

## 2012-11-21 DIAGNOSIS — IMO0002 Reserved for concepts with insufficient information to code with codable children: Secondary | ICD-10-CM

## 2012-11-21 DIAGNOSIS — I96 Gangrene, not elsewhere classified: Secondary | ICD-10-CM

## 2012-11-21 DIAGNOSIS — E1149 Type 2 diabetes mellitus with other diabetic neurological complication: Secondary | ICD-10-CM

## 2012-11-21 DIAGNOSIS — D649 Anemia, unspecified: Secondary | ICD-10-CM

## 2012-11-21 DIAGNOSIS — N179 Acute kidney failure, unspecified: Secondary | ICD-10-CM

## 2012-11-21 NOTE — Progress Notes (Signed)
Patient ID: [name], male   DOB: 1950/04/25, 62 y.o.   MRN: [id number] Location:  [facility] [place]. Mariea Clonts, D.O., C.M.D.  PCP: Boneta Lucks, MD in [place]  Code Status: full code  No Known Allergies  Chief Complaint  Patient presents with  . Discharge Note    to home with home health PT, RN    HPI:  Weight at admission was 180 lbs 09/12/12 Dropped to 137 lbs 10/27/12 Then up to 150 lbs 11/18/12  Majority of BP readings are at goal in 110s-130s, occasionally up to 150s Review of Systems:  Review of Systems  Constitutional: Negative for fever and chills.  Respiratory: Negative for cough and shortness of breath.   Cardiovascular: Negative for chest pain.  Gastrointestinal: Positive for nausea.  Genitourinary: Negative for dysuria.  Musculoskeletal: Negative for myalgias.  Neurological: Negative for dizziness and headaches.     Past Medical History  Diagnosis Date  . Hypertension   . 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 Myocardial infarction   . Hx of seasonal allergies   . GERD (gastroesophageal reflux disease)   . High cholesterol   . Exertional shortness of breath     "before my last OR; I'm fine now" (09/26/2012)  . Type II diabetes mellitus     Past Surgical History  Procedure Laterality Date  . 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 Amputation Right 07/29/2012    Procedure: AMPUTATION 3RD TOE;  Surgeon: Kerin Salen, MD;  Location: [place];  Service: Orthopedics;  Laterality: Right;  right third toe amputation  . Amputation Right 08/02/2012    Procedure: right transmetatarsal amputation;  Surgeon: Kerin Salen, MD;  Location: [place];  Service: Orthopedics;  Laterality: Right;  . Cataract extraction w/ intraocular lens  implant, bilateral  ?2010  . Amputation Right 09/06/2012    Procedure: AMPUTATION BELOW KNEE;  Surgeon: Kerin Salen, MD;  Location: [place];  Service: Orthopedics;  Laterality: Right;  . Peripherally inserted central  catheter insertion Right 09/2012    upper arm  . Amputation Left 09/27/2012    Procedure: AMPUTATION BELOW KNEE;  Surgeon: Kerin Salen, MD;  Location: [place];  Service: Orthopedics;  Laterality: Left;  . Esophagogastroduodenoscopy N/A 10/08/2012    Procedure: ESOPHAGOGASTRODUODENOSCOPY (EGD);  Surgeon: Winfield Cunas., MD;  Location: [facility] ENDOSCOPY;  Service: Endoscopy;  Laterality: N/A;    Social History:   reports that he has quit smoking. His smoking use included Cigarettes. He has a 2.4 pack-year smoking history. He has never used smokeless tobacco. He reports that he does not drink alcohol or use illicit drugs.  No family history on file.  Medications: Patient's Medications  New Prescriptions   No medications on file  Previous Medications   ACETAMINOPHEN (TYLENOL) 325 MG TABLET    Take 2 tablets (650 mg total) by mouth every 6 (six) hours as needed.   ASPIRIN 325 MG TABLET    Take 1 tablet (325 mg total) by mouth 2 (two) times daily.   ASPIRIN 325 MG TABLET    Take 1 tablet (325 mg total) by mouth 2 (two) times daily.   DARBEPOETIN (ARANESP) 150 MCG/0.3ML SOLN INJECTION    Inject 0.3 mL (150 mcg total) into the skin every Monday at 6 PM.   FLUOXETINE (PROZAC) 40 MG CAPSULE    Take 1 capsule (40 mg total) by mouth daily.   HYDRALAZINE (APRESOLINE) 20 MG/ML  INJECTION    Inject 0.25 mL (5 mg total) into the vein every 6 (six) hours as needed (for SBP greater than 170 or DBP greater than 100).   HYDRALAZINE (APRESOLINE) 50 MG TABLET    Take 1 tablet (50 mg total) by mouth every 8 (eight) hours.   INSULIN ASPART (NOVOLOG) 100 UNIT/ML INJECTION    Inject 4 Units into the skin 3 (three) times daily with meals.   INSULIN DETEMIR (LEVEMIR) 100 UNIT/ML INJECTION    Inject 0.15 mLs (15 Units total) into the skin at bedtime.   MEGESTROL (MEGACE) 40 MG/ML SUSPENSION    Take 10 mL (400 mg total) by mouth daily.   METOCLOPRAMIDE (REGLAN) 5 MG TABLET    Take 1 tablet (5 mg total) by mouth 3 (three)  times daily before meals.   METOPROLOL (LOPRESSOR) 50 MG TABLET    Take 1 tablet (50 mg total) by mouth 2 (two) times daily.   ONDANSETRON (ZOFRAN) 4 MG TABLET    Take 1 tablet (4 mg total) by mouth every 6 (six) hours as needed for nausea.   OXYCODONE (OXY IR/ROXICODONE) 5 MG IMMEDIATE RELEASE TABLET    Take 1-2 tablets (5-10 mg total) by mouth every 4 (four) hours as needed.   PANTOPRAZOLE (PROTONIX) 40 MG TABLET    Take 1 tablet (40 mg total) by mouth daily at 6 (six) AM.   POLYETHYLENE GLYCOL (MIRALAX / GLYCOLAX) PACKET    Take 17 g by mouth daily.  Modified Medications   No medications on file  Discontinued Medications   No medications on file    Physical Exam: Filed Vitals:   11/21/12 1125  BP: 160/89  Pulse: 89  Temp: 97 F (36.1 C)  Resp: 16  Height: 5\' 11"  (1.803 m)  Weight: 150 lb (68.04 kg)  SpO2: 98%  Physical Exam  Constitutional: He is oriented to person, place, and time. No distress.  HENT:  Head: Normocephalic and atraumatic.  Cardiovascular: Normal rate, regular rhythm, normal heart sounds and intact distal pulses.   Pulmonary/Chest: Effort normal and breath sounds normal. No respiratory distress.  Abdominal: Soft. Bowel sounds are normal. He exhibits no distension. There is no tenderness.  Musculoskeletal:  Right bka  Neurological: He is alert and oriented to person, place, and time.  Skin: Skin is warm and dry.   Labs reviewed: Basic Metabolic Panel:  Recent Labs  10/07/12 0555 10/08/12 0500  10/13/12 1155  10/16/12 0335  10/18/12 0400 10/19/12 0600 10/20/12 0510  NA 146* 146*  < > 152*  < > 146*  < > 147* 140 136  K 3.3* 3.7  < > 3.0*  < > 3.4*  < > 4.2 4.0 3.6  CL 110 111  < > 117*  < > 113*  < > 115* 106 102  CO2 18* 17*  < > 27  < > 24  < > 24 23 23   GLUCOSE 182* 212*  < > 183*  < > 100*  < > 115* 108* 107*  BUN 33* 30*  < > 18  < > 17  < > 14 13 13   CREATININE 3.12* 2.80*  < > 1.66*  < > 1.55*  < > 1.44* 1.40* 1.43*  CALCIUM 8.9 9.0  < >  8.7  < > 8.2*  < > 8.7 8.7 8.3*  MG  --   --   < >  --   --  2.0  --   --  1.8 1.8  PHOS 5.2* 4.8*  --  2.4  --   --   --   --   --   --   < > = values in this interval not displayed. Liver Function Tests:  Recent Labs  10/12/12 0530 10/13/12 1155 10/19/12 0600 10/20/12 0510  AST 22  --  31 21  ALT 19  --  21 17  ALKPHOS 177*  --  179* 159*  BILITOT 1.6*  --  1.8* 1.5*  PROT 7.5  --  7.0 6.0  ALBUMIN 1.7* 1.7* 1.9* 1.7*    Recent Labs  09/26/12 1421  LIPASE 27    Recent Labs  08/08/12 1145  AMMONIA 19   CBC:  Recent Labs  10/12/12 0530  10/16/12 0335 10/19/12 0600 10/20/12 0510  WBC 13.7*  < > 7.3 6.2 4.7  NEUTROABS 10.7*  --   --  3.1 2.1  HGB 8.4*  < > 8.8* 9.1* 8.6*  HCT 25.5*  < > 27.1* 27.7* 26.5*  MCV 82.5  < > 82.4 82.9 83.1  PLT 447*  < > 352 291 249  < > = values in this interval not displayed. Cardiac Enzymes:  Recent Labs  09/10/12 0430  CKTOTAL 35  CBG:  Recent Labs  10/19/12 2251 10/20/12 0759 10/20/12 1129  GLUCAP 89 123* 140*   Procedures and Imaging Studies:  See previous notes  Assessment/Plan:   1. Diabetes mellitus type 2, uncontrolled, with complications -continue current therapy--no changes made at time of discharge  2. Gangrene of foot -right--s/p right BKA  3. Acute kidney injury -resolved, cont to monitor renal function outpt with DMII  4. Anemia -has stabilized--got 2 units prbcs during BKA surgery  5. Diabetic gastroparesis associated with type 2 diabetes mellitus -was having persistent difficulty with early satiety and n/v -had EGD that revealed gastroparesis -has improved and gaining weight with reglan and megace -megace should be stopped after 12 week course due to potential side effects and lack of benefit beyond 12 wks  6. Chronic systolic heart failure -was monitored carefully here with daily weights--cont CONCHO cardiac prudent diet at home   Patient is being discharged with home health services:   PT, RN  Patient is being discharged with the following durable medical equipment:  Diabetic supplies, 3 in 1 commode, wheelchair, pressure reducing cushion, sliding board  Patient has been advised to f/u with their PCP in 1-2 weeks to bring them up to date on their rehab stay.  They were provided with a 30 day supply of scripts for prescription medications and refills must be obtained from their PCP.      Follow up appts:   Dr. Boneta Lucks [phone] new PCP 12/08/12 Dr. Mayer Camel at [facility] [phone] 11/21/12 [place] [phone]  Labs/tests ordered:  Monthly cbc to determine if aranesp must be given for hgb <8 on first wed

## 2012-12-21 ENCOUNTER — Other Ambulatory Visit: Payer: Self-pay | Admitting: Internal Medicine

## 2012-12-26 ENCOUNTER — Other Ambulatory Visit: Payer: Self-pay | Admitting: Internal Medicine

## 2012-12-28 ENCOUNTER — Other Ambulatory Visit: Payer: Self-pay | Admitting: Internal Medicine

## 2013-02-16 ENCOUNTER — Other Ambulatory Visit: Payer: Self-pay | Admitting: Internal Medicine

## 2013-03-16 ENCOUNTER — Encounter: Payer: Self-pay | Admitting: Internal Medicine

## 2013-04-06 ENCOUNTER — Encounter: Payer: Self-pay | Admitting: Internal Medicine

## 2014-03-21 ENCOUNTER — Encounter (HOSPITAL_COMMUNITY): Payer: Self-pay | Admitting: Orthopedic Surgery

## 2014-05-09 IMAGING — CT CT ABD-PELV W/O CM
2 of 4 series · 16 of 46 positions shown, 18 images · non-contrast
Comparison: None.

CLINICAL DATA: Nausea/vomiting, evaluate bowel obstruction

CT ABDOMEN AND PELVIS WITHOUT CONTRAST
TECHNIQUE: Multidetector CT imaging of the abdomen and pelvis was
performed following the standard protocol without intravenous
contrast.

[Series 2: abd/ pelvis 5.0 i30f 1 · axial · 0.72mm/px · z∈[-196,+220]mm · 13 of 93 slices shown, 15 images]
[im 5/93  soft-tissue]
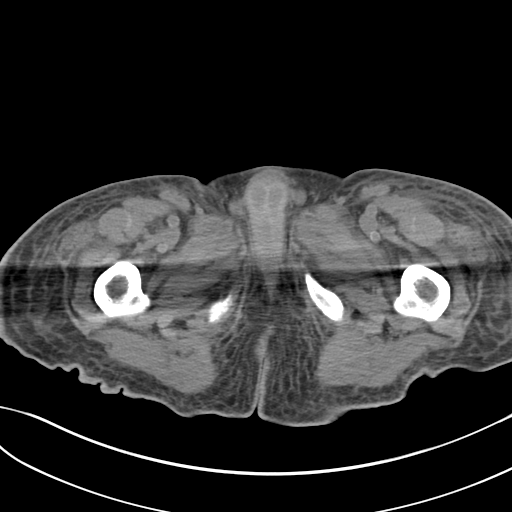
[im 5/93  bone]
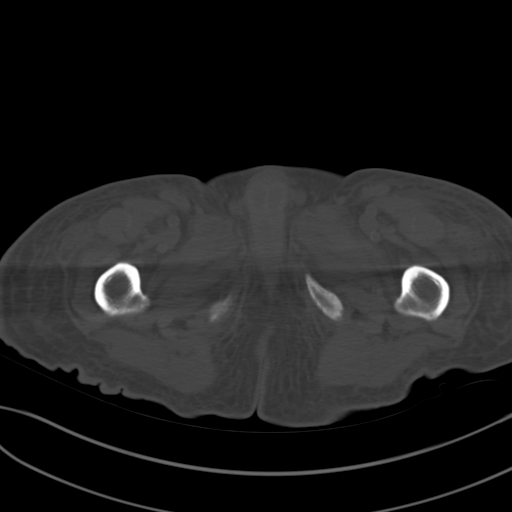
[im 14/93  soft-tissue]
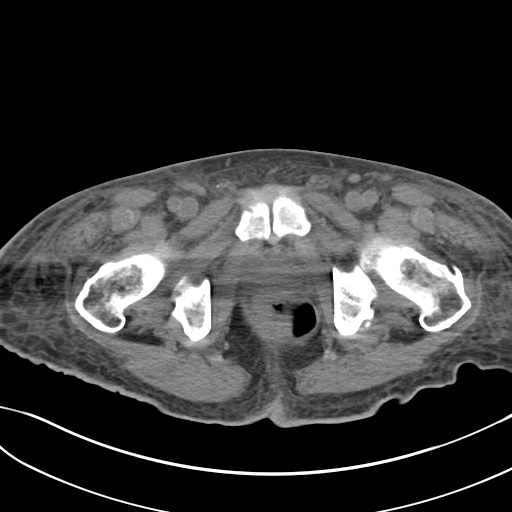
[im 18/93  soft-tissue]
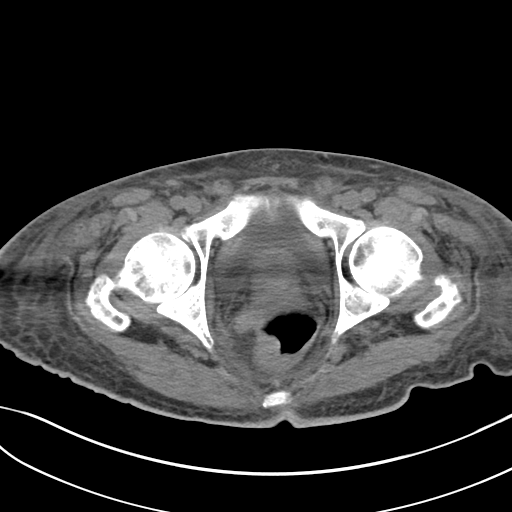
[im 27/93  soft-tissue]
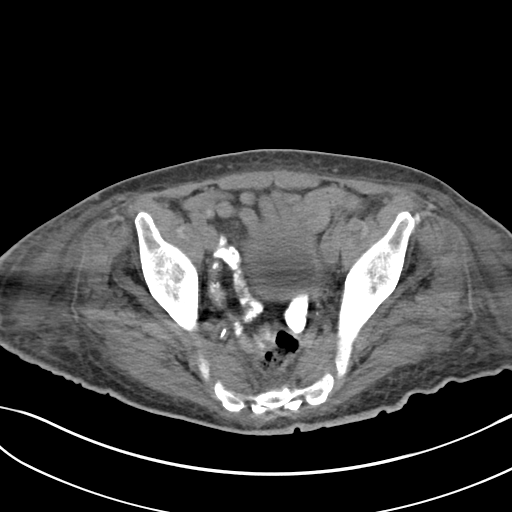
[im 31/93  soft-tissue]
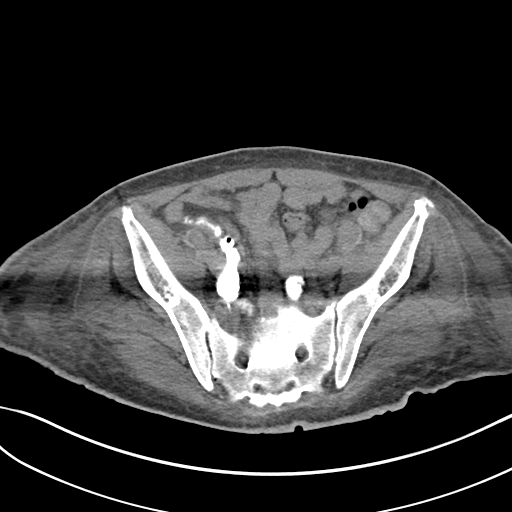
[im 40/93  soft-tissue]
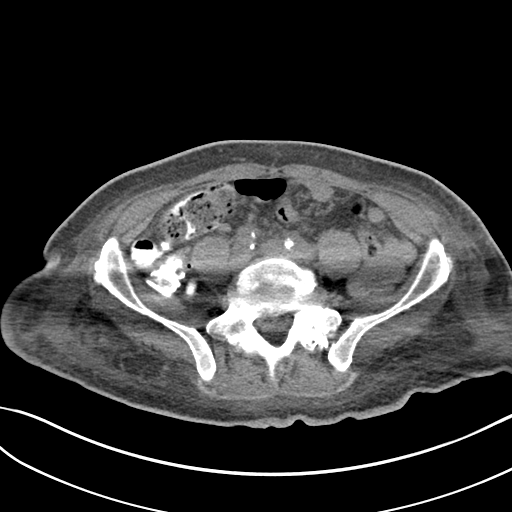
[im 49/93  soft-tissue]
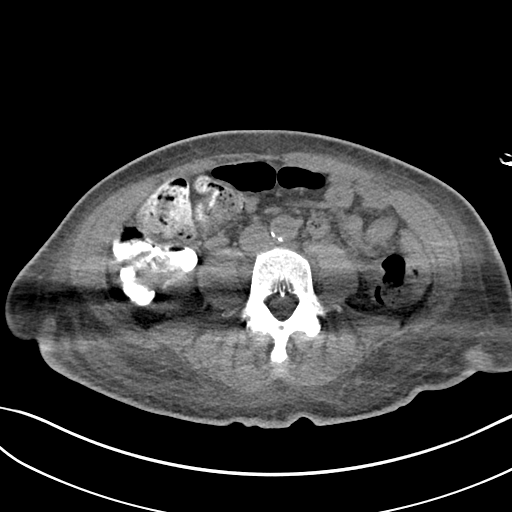
[im 53/93  soft-tissue]
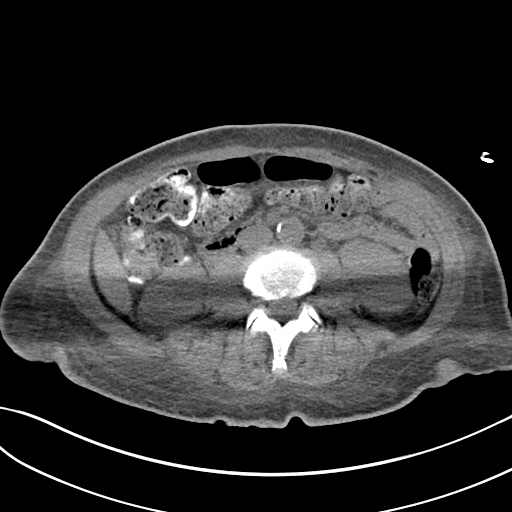
[im 62/93  soft-tissue]
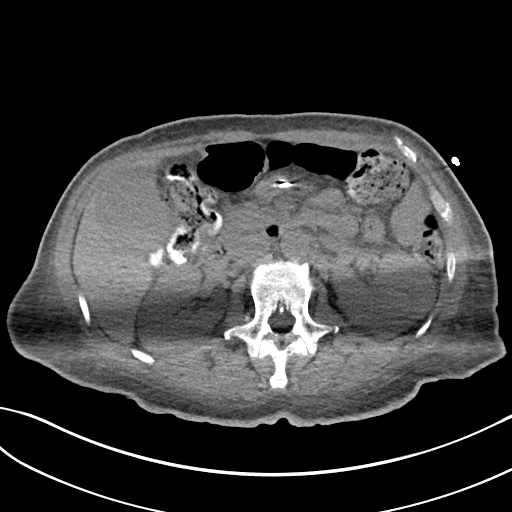
[im 62/93  bone]
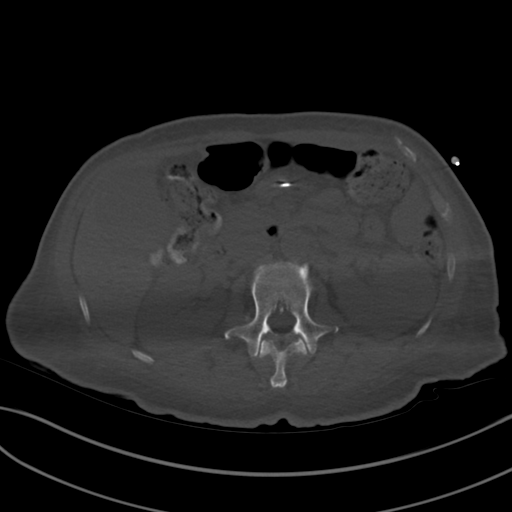
[im 66/93  soft-tissue]
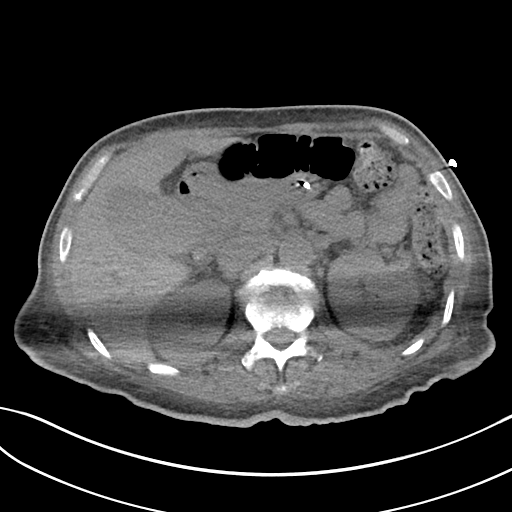
[im 75/93  soft-tissue]
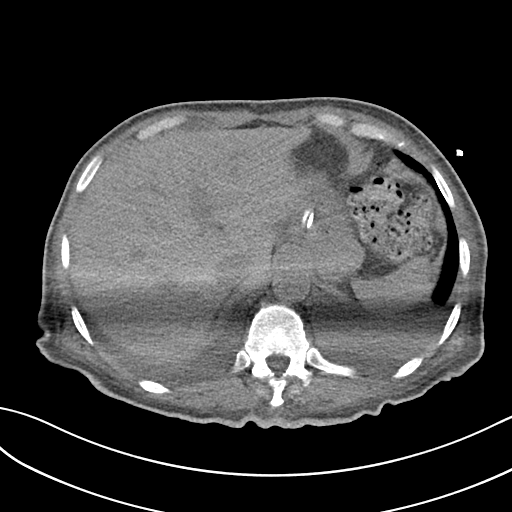
[im 79/93  soft-tissue]
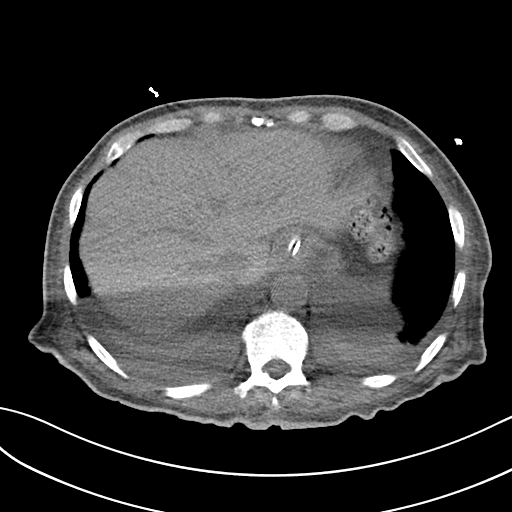
[im 88/93  soft-tissue]
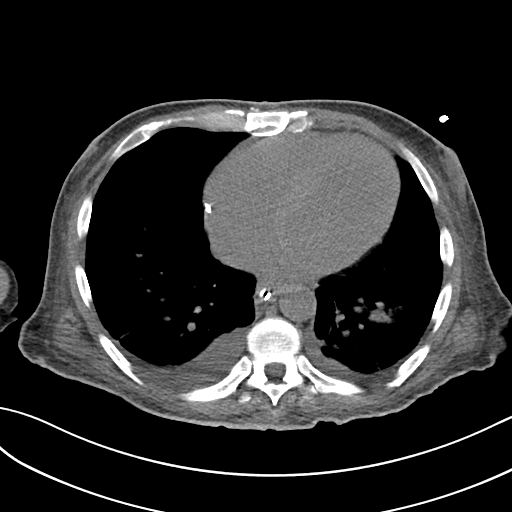

[Series 5: cor st · coronal · 0.73mm/px · 3 of 75 slices shown]
[im 25/75  soft-tissue]
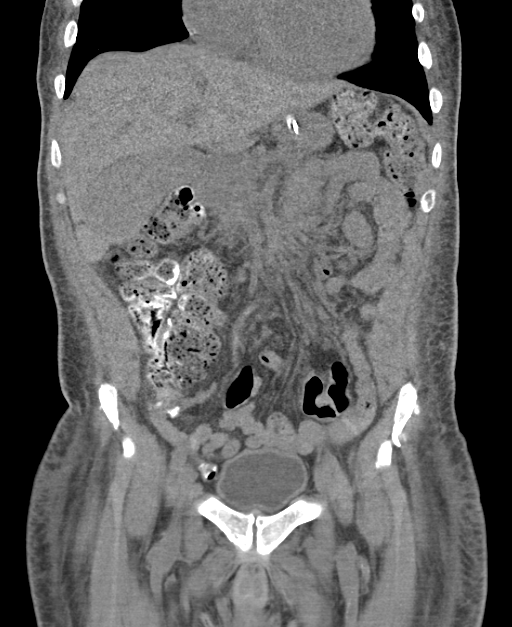
[im 33/75  soft-tissue]
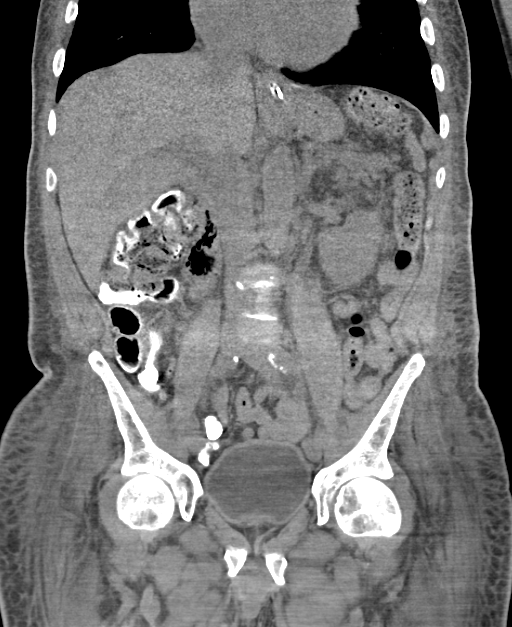
[im 42/75  soft-tissue]
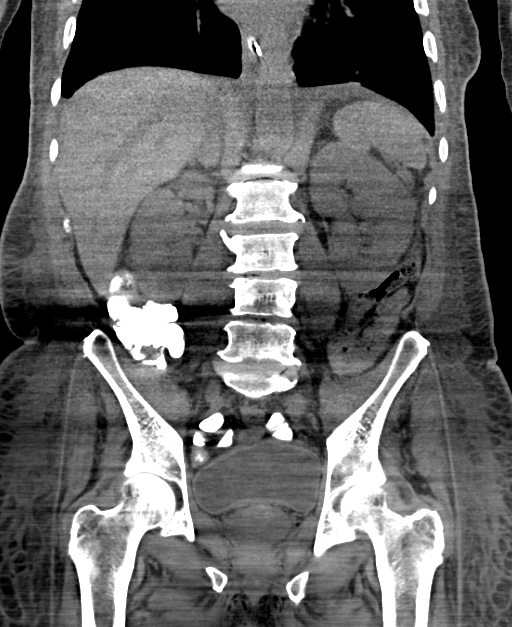

[16 of 46 positions shown; findings below may reference images not displayed]

FINDINGS: Evaluation is obscured by streak artifact from the
patient's arms.

Small bilateral pleural effusions, right greater than left.
Associated lower lobe opacities, likely atelectasis.

Enteric tube terminates in the distal gastric body.

Unenhanced liver, spleen, pancreas, and left adrenal gland are
within normal limits.

2.0 cm right adrenal nodule (series 2/image 24), poorly evaluated.

Gallbladder is mildly distended with suspected sludge/noncalcified
gallstones.  No intrahepatic or extrahepatic ductal dilatation.

Kidneys are unremarkable.  No renal calculi or hydronephrosis.

No evidence of bowel obstruction.  Normal appendix.

Atherosclerotic calcifications of the abdominal aorta and branch
vessels.

No abdominopelvic ascites.

No gross abdominopelvic lymphadenopathy.

Prostate is unremarkable.

Bladder is within normal limits.

Body wall edema.  Subcutaneous gas in the left flank likely
reflects injection site (series 2/image 39).

Degenerative changes of the visualized thoracolumbar spine.
IMPRESSION: No evidence of bowel obstruction.  Normal appendix.

Distended gallbladder with suspected sludge/noncalcified
gallstones.

2.0 cm right adrenal nodule, statistically likely reflecting a
benign adenoma, although poorly evaluated.  Consider follow-up MRI
abdomen without contrast as an outpatient for further
characterization.

Small bilateral pleural effusions with body wall edema.

## 2014-12-17 ENCOUNTER — Emergency Department (HOSPITAL_COMMUNITY)
Admission: EM | Admit: 2014-12-17 | Discharge: 2014-12-17 | Disposition: A | Discharge status: Home / Self Care | Payer: Medicare Other | Attending: Emergency Medicine | Admitting: Emergency Medicine

## 2014-12-17 ENCOUNTER — Encounter (HOSPITAL_COMMUNITY): Payer: Self-pay | Admitting: Emergency Medicine

## 2014-12-17 DIAGNOSIS — I252 Old myocardial infarction: Secondary | ICD-10-CM | POA: Diagnosis not present

## 2014-12-17 DIAGNOSIS — Z79899 Other long term (current) drug therapy: Secondary | ICD-10-CM | POA: Diagnosis not present

## 2014-12-17 DIAGNOSIS — I10 Essential (primary) hypertension: Secondary | ICD-10-CM | POA: Diagnosis not present

## 2014-12-17 DIAGNOSIS — Z8673 Personal history of transient ischemic attack (TIA), and cerebral infarction without residual deficits: Secondary | ICD-10-CM | POA: Insufficient documentation

## 2014-12-17 DIAGNOSIS — Z7982 Long term (current) use of aspirin: Secondary | ICD-10-CM | POA: Diagnosis not present

## 2014-12-17 DIAGNOSIS — E782 Mixed hyperlipidemia: Secondary | ICD-10-CM | POA: Insufficient documentation

## 2014-12-17 DIAGNOSIS — E119 Type 2 diabetes mellitus without complications: Secondary | ICD-10-CM | POA: Diagnosis not present

## 2014-12-17 DIAGNOSIS — K219 Gastro-esophageal reflux disease without esophagitis: Secondary | ICD-10-CM | POA: Insufficient documentation

## 2014-12-17 DIAGNOSIS — M79604 Pain in right leg: Secondary | ICD-10-CM | POA: Insufficient documentation

## 2014-12-17 DIAGNOSIS — Z794 Long term (current) use of insulin: Secondary | ICD-10-CM | POA: Diagnosis not present

## 2014-12-17 LAB — CBG MONITORING, ED: Glucose-Capillary: 95 mg/dL (ref 65–99)

## 2014-12-17 MED ORDER — HYDROCODONE-ACETAMINOPHEN 5-325 MG PO TABS: 1.0000 | ORAL_TABLET | Freq: Four times a day (QID) | ORAL | Status: DC | PRN

## 2014-12-17 MED ORDER — TRAMADOL HCL 50 MG PO TABS
50.0000 mg | ORAL_TABLET | Freq: Once | ORAL | Status: AC
  Administered 2014-12-17: 50 mg via ORAL
  Filled 2014-12-17: qty 1

## 2014-12-17 NOTE — ED Notes (Addendum)
Pt from home for eval of right knee pain, pt with bilateral BKA and states right stump has been sore x2 days. Pt denies any injury or trauma, denies any reddness or warmth to touch, denies any fevers. Pt ambulatory in triage. Knee non tender in triage.

## 2014-12-17 NOTE — Discharge Instructions (Signed)
Musculoskeletal Pain °Musculoskeletal pain is muscle and boney aches and pains. These pains can occur in any part of the body. Your caregiver may treat you without knowing the cause of the pain. They may treat you if blood or urine tests, X-rays, and other tests were normal.  °CAUSES °There is often not a definite cause or reason for these pains. These pains may be caused by a type of germ (virus). The discomfort may also come from overuse. Overuse includes working out too hard when your body is not fit. Boney aches also come from weather changes. Bone is sensitive to atmospheric pressure changes. °HOME CARE INSTRUCTIONS  °· Ask when your test results will be ready. Make sure you get your test results. °· Only take over-the-counter or prescription medicines for pain, discomfort, or fever as directed by your caregiver. If you were given medications for your condition, do not drive, operate machinery or power tools, or sign legal documents for 24 hours. Do not drink alcohol. Do not take sleeping pills or other medications that may interfere with treatment. °· Continue all activities unless the activities cause more pain. When the pain lessens, slowly resume normal activities. Gradually increase the intensity and duration of the activities or exercise. °· During periods of severe pain, bed rest may be helpful. Lay or sit in any position that is comfortable. °· Putting ice on the injured area. °¨ Put ice in a bag. °¨ Place a towel between your skin and the bag. °¨ Leave the ice on for 15 to 20 minutes, 3 to 4 times a day. °· Follow up with your caregiver for continued problems and no reason can be found for the pain. If the pain becomes worse or does not go away, it may be necessary to repeat tests or do additional testing. Your caregiver may need to look further for a possible cause. °SEEK IMMEDIATE MEDICAL CARE IF: °· You have pain that is getting worse and is not relieved by medications. °· You develop chest pain  that is associated with shortness or breath, sweating, feeling sick to your stomach (nauseous), or throw up (vomit). °· Your pain becomes localized to the abdomen. °· You develop any new symptoms that seem different or that concern you. °MAKE SURE YOU:  °· Understand these instructions. °· Will watch your condition. °· Will get help right away if you are not doing well or get worse. °  °This information is not intended to replace advice given to you by your health care provider. Make sure you discuss any questions you have with your health care provider. °  °Document Released: 02/22/2005 Document Revised: 05/17/2011 Document Reviewed: 10/27/2012 °Elsevier Interactive Patient Education ©2016 Elsevier Inc. ° °Emergency Department Resource Guide °1) Find a Doctor and Pay Out of Pocket °Although you won't have to find out who is covered by your insurance plan, it is a good idea to ask around and get recommendations. You will then need to call the office and see if the doctor you have chosen will accept you as a new patient and what types of options they offer for patients who are self-pay. Some doctors offer discounts or will set up payment plans for their patients who do not have insurance, but you will need to ask so you aren't surprised when you get to your appointment. ° °2) Contact Your Local Health Department °Not all health departments have doctors that can see patients for sick visits, but many do, so it is worth a call to   see if yours does. If you don't know where your local health department is, you can check in your phone book. The CDC also has a tool to help you locate your state's health department, and many state websites also have listings of all of their local health departments. ° °3) Find a Walk-in Clinic °If your illness is not likely to be very severe or complicated, you may want to try a walk in clinic. These are popping up all over the country in pharmacies, drugstores, and shopping centers.  They're usually staffed by nurse practitioners or physician assistants that have been trained to treat common illnesses and complaints. They're usually fairly quick and inexpensive. However, if you have serious medical issues or chronic medical problems, these are probably not your best option. ° °No Primary Care Doctor: °- Call [facility] at  832-8000 - they can help you locate a primary care doctor that  accepts your insurance, provides certain services, etc. °- Physician Referral Service- 1-800-533-3463 ° °Chronic Pain Problems: °Organization         Address  Phone   Notes  °[facility]  (336) 297-2271 Patients need to be referred by their primary care doctor.  ° °Medication Assistance: °Organization         Address  Phone   Notes  °[place] [facility] [address] °[place], [place] [address] (336) 641-8030 --Must be a resident of [place] °-- Must have NO insurance coverage whatsoever (no Medicaid/ Medicare, etc.) °-- The pt. MUST have a primary care doctor that directs their care regularly and follows them in the community °  °[facility]  (866) 331-1348   °[employer]  (888) 892-1162   ° °Agencies that provide inexpensive medical care: °Organization         Address  Phone   Notes  °[facility]  (336) 832-8035   °[facility]    (336) 832-7272   °[facility] [address] °[place], [place] [address] (336) 832-4777   °[facility] [address], °[place] (336) 271-4999   °[facility]    (336) 373-0678   °[facility]    (336) 272-1050   °[facility] ° [address], [place] Phone:  (336) 832-4444, Fax:  (336) 832-4440 Hours of Operation:  9 am - 6 pm, M-F.  Also accepts Medicaid/Medicare and self-pay.  °[facility] ° [address], [place] Phone: (336) 832-3150, Fax: (336)  832-3151. Hours of Operation:  8:30 am - 5:30 pm, M-F.  Also accepts Medicaid and self-pay.  °[facility] [place] [address], [place] Phone: (336) 878-6027   °[facility] [address], [place], [place] (336)723-1848, Ext. 123 Mondays & Thursdays: 7-9 AM.  First 15 patients are seen on a first come, first serve basis. °  ° °Medicaid-accepting [place] Providers: ° °Organization         Address  Phone   Notes  °[facility] [address], [place] (336) 641-2100 Also accepts self-pay patients.  °[facility] [address], [place] ° (336) 856-9996   °[facility] [address], [place] (336) 288-8857   °[facility] [address] [place] [address], [place] (336) 299-7000   °Veita Bland [address],   ° (  336) 373-1557 Only accepts [place] Access Medicaid patients after they have their name applied to their card.  ° °Self-Pay (no insurance) in [place]: ° °Organization         Address  Phone   Notes  °Sickle Cell Patients, [facility] [address], [place] (336) 832-1970   °[facility] [address], [place] (336) 832-4400   °[facility] ° [address], [place] (336) 992-4800   °[facility]/Dr. Osei-Bonsu ° [address] [place] [address], [place] or [address], [place] (336) [phone] Phone number for both [place] and [place] locations is the same.  °[facility] [address], [place] (336) 299-0000   °[facility] [address] [place] [address], [place] or [address] (336) 852-7530 °(336) 878-2260   °[facility] [address], [place] (336) 350-1642, phone; (336) 294-5005, fax Sees patients 1st and 3rd Saturday of every month.  Must not qualify for public or private insurance (i.e.  Medicaid, Medicare, [place] Health Choice, Veterans' Benefits) • Household income should be no more than 200% of the poverty level •The clinic cannot treat you if you are pregnant or think you are pregnant • Sexually transmitted diseases are not treated at the clinic.  ° ° °Dental Care: °Organization         Address  Phone  Notes  °[facility] [address], [place] (336) 641-6152 Accepts children up to age 21 who are enrolled in Medicaid or [place] Health Choice; pregnant women with a Medicaid card; and children who have applied for Medicaid or [place] Health Choice, but were declined, whose parents can pay a reduced fee at time of service.  °[facility] [place]  [address], [place] (336) 641-7733 Accepts children up to age 21 who are enrolled in Medicaid or [place] Health Choice; pregnant women with a Medicaid card; and children who have applied for Medicaid or [place] Health Choice, but were declined, whose parents can pay a reduced fee at time of service.  °[facility] ° [address], [place] (336) 641-4533 Patients are seen by appointment only. Walk-ins are not accepted. [facility] will see patients 18 years of age and older. °Monday - Tuesday (8am-5pm) °Most Wednesdays (8:30-5pm) °$30 per visit, cash only  °[facility] ° [address], [place] (336) 641-4533 Patients are seen by appointment only. Walk-ins are not accepted. [facility] will see patients 18 years of age and older. °One Wednesday Evening (Monthly: Volunteer Based).  $30 per visit, cash only  °[facility]  (919) 537-3737 for adults; Children under age 4, call Graduate Pediatric Dentistry at ([phone]) [phone]. Children aged 4-14, please call (919) [phone] to request a pediatric application. ° Dental services are provided in all areas of dental care including fillings,  crowns and bridges, complete and partial dentures, implants, gum treatment, root canals, and extractions. Preventive care is also provided. Treatment is provided to both adults and children. °Patients are selected via a lottery and there is often a waiting list. °  °[facility] [address], °[place] ° (336) 763-8833 [email or web address] °  °[facility] [address], [place], [place] (336)723-1848, Ext. 123 Second and Fourth Thursday of each month, opens at 6:30   AM; Clinic ends at 9 AM.  Patients are seen on a first-come first-served basis, and a limited number are seen during each clinic.  ° °[facility] ° [address], [place], [place] (336) 723-7904   Eligibility Requirements °You must have lived in [place], [place], or [place] counties for at least the last three months. °  You cannot be eligible for state or federal sponsored healthcare insurance, including Veterans Administration, Medicaid, or Medicare. °  You generally cannot be eligible for healthcare insurance through your employer.  °  How to apply: °Eligibility screenings are held every Tuesday and Wednesday afternoon from 1:00 pm until 4:00 pm. You do not need an appointment for the interview!  °[facility] [address], [place], [place] 336-631-2330   °[facility]  336-342-8273   °[facility]  336-703-3100   °[facility]  336-570-6415   ° °Behavioral Health Resources in the Community: °Intensive Outpatient Programs °Organization         Address  Phone  Notes  °[facility] [address], [place], [place] 336-878-6098   °[facility] [address], [place], [place] 336-832-9800   °[facility] [address], [place], [place] ° 336-882-2125   °[facility] [address],  °[place], [place] 1-800-853-5163 or 336-641-4981   °Substance Abuse  Resources °Organization         Address  Phone  Notes  °[facility]  336-882-2125   °[facility]  336-784-9470   °[facility]  336-285-9073   °[facility]  336-845-3988   °Residential & Outpatient Substance Abuse Program  1-800-659-3381   °Psychological Services °Organization         Address  Phone  Notes  °[facility]  336- 832-9600   °[facility]  336- 378-7881   °[facility] [address], [place] 1-800-853-5163 or 336-641-4981   ° °Mobile Crisis Teams °Organization         Address  Phone  Notes  °[facility], Mobile Crisis Care Unit  1-877-626-1772   °Assertive °[facility] ° [address] [place], [place] 336-834-9664   °Sharon DeEsch [address] °[place] 336-554-5454   ° °Self-Help/Support Groups °Organization         Address  Phone             Notes  °[facility] - variety of support groups  336- 373-1402 Call for more information  °Narcotics Anonymous (NA), [facility] [address], °[place]  2 meetings at this location  ° °Residential Treatment Programs °Organization         Address  Phone  Notes  °[facility] [address],    °[place]  1-866-801-8205   °[facility] ° [address], [place], [place] 704-293-8524   °[facility] [address], [place] 336-845-3988 Admissions: 8am-3pm M-F  °[facility] [address]    °[place], [place] 336-841-1104   °[facility] [address], [place], [place] 336-379-7146   °[facility] [address]  °[place], [place] 336-285-9073   °[facility] - Intensive Outpatient [address], , [place] 336-852-3033   °[facility] [address]  °[place], [place] 1-877-615-2722 or 336-784-9470   °  [facility] [address] [place], [place] 336-227-7417 Accepts Medicaid  °[facility] [address]  °[place] 1-800-659-3381 Substance Abuse/Addiction Treatment  ° °[place] Behavioral Health Resources °Organization         Address  Phone  Notes  °[facility]  (888) 581-9988   °Julie Brannon, PhD [address] [place], [place]   (336) 349-5553 or (336) 951-0000   °[facility]   [address] °[place], [place] (336) 349-4454   °[facility] [address], [place], [place] (336) 342-8316 Insurance/Medicaid/sponsorship through [facility]  °[facility] [address]                                    [place], [place] (336) 342-8316 Therapy/tele-psych/case  °[facility] [address]  ° [place], [place] (336) 349-2233    °Dr. Arfeen  (336) 349-4544   °[facility] [place]  [employer] [place] [facility] 1) [address], [place] °2) [address], [place] °3)  [address], [place] (336) 349-3220 °(336) 342-7768 ° °(336) 342-8140   °[place] [facility] (336) 342-1394 or (336) 342-3537 (After Hours)    ° ° ° °

## 2014-12-17 NOTE — ED Provider Notes (Signed)
CSN: [id number]     Arrival date & time 12/17/14  1158 History   First MD Initiated Contact with Patient 12/17/14 1506     Chief Complaint  Patient presents with  . Leg Pain     (Consider location/radiation/quality/duration/timing/severity/associated sxs/prior Treatment) Patient is a 64 y.o. male presenting with leg pain.  Leg Pain Location:  Knee Time since incident:  2 days Injury: no   Knee location:  R knee Pain details:    Quality:  Sharp   Radiates to:  Does not radiate   Severity:  Moderate   Onset quality:  Gradual   Duration:  2 days   Timing:  Constant   Progression:  Unchanged Relieved by:  Rest Exacerbated by: walking with prosthetic. Ineffective treatments:  None tried (has tried tylenol and aleve in the past) Associated symptoms: no fever and no swelling   Associated symptoms comment:  No sores, no swelling, no redness.   Past Medical History  Diagnosis Date  . Hypertension   . Stroke [facility]) 2009    memory loss  . Myocardial infarction ([place])   . Hx of seasonal allergies   . GERD (gastroesophageal reflux disease)   . High cholesterol   . Exertional shortness of breath     "before my last OR; I'm fine now" (09/26/2012)  . Type II diabetes mellitus [facility])    Past Surgical History  Procedure Laterality Date  . 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 Amputation Right 07/29/2012    Procedure: AMPUTATION 3RD TOE;  Surgeon: Kerin Salen, MD;  Location: [place];  Service: Orthopedics;  Laterality: Right;  right third toe amputation  . Amputation Right 08/02/2012    Procedure: right transmetatarsal amputation;  Surgeon: Kerin Salen, MD;  Location: [place];  Service: Orthopedics;  Laterality: Right;  . Cataract extraction w/ intraocular lens  implant, bilateral  ?2010  . Amputation Right 09/06/2012    Procedure: AMPUTATION BELOW KNEE;  Surgeon: Kerin Salen, MD;  Location: [place];  Service: Orthopedics;  Laterality: Right;  . Peripherally inserted central  catheter insertion Right 09/2012    upper arm  . Amputation Left 09/27/2012    Procedure: AMPUTATION BELOW KNEE;  Surgeon: Kerin Salen, MD;  Location: [place];  Service: Orthopedics;  Laterality: Left;  . Esophagogastroduodenoscopy N/A 10/08/2012    Procedure: ESOPHAGOGASTRODUODENOSCOPY (EGD);  Surgeon: Winfield Cunas., MD;  [facility] END[facility]COPY;  Service: Endoscopy;  Laterality: N/A;   No family history on file. Social History  Substance Use Topics  . Smoking status: Former Smoker -- 0.12 packs/day for 20 years    Types: Cigarettes  . Smokeless tobacco: Never Used  . Alcohol Use: No     Comment: last drink 2010    Review of Systems  Constitutional: Negative for fever.  All other systems reviewed and are negative.     Allergies  Review of patient's allergies indicates no known allergies.  Home Medications   Prior to Admission medications   Medication Sig Start Date End Date Taking? Authorizing Provider  acetaminophen (TYLENOL) 325 MG tablet Take 2 tablets (650 mg total) by mouth every 6 (six) hours as needed. 08/11/12   Ripudeep Krystal Eaton, MD  aspirin 325 MG tablet Take 1 tablet (325 mg total) by mouth 2 (two) times daily. 10/20/12   Allie Bossier, MD  darbepoetin [facility]) 150 MCG/0.3ML SOLN injection Inject 0.3 mL (150 mcg total) into the skin every Monday at  6 PM. 10/20/12   Allie Bossier, MD  FLUoxetine (PROZAC) 40 MG capsule Take 1 capsule (40 mg total) by mouth daily. 10/20/12   Allie Bossier, MD  hydrALAZINE (APRESOLINE) 50 MG tablet Take 1 tablet (50 mg total) by mouth every 8 (eight) hours. 08/11/12   Ripudeep Krystal Eaton, MD  insulin aspart (NOVOLOG) 100 UNIT/ML injection Inject 4 Units into the skin 3 (three) times daily with meals. 08/11/12   Ripudeep Krystal Eaton, MD  insulin detemir (LEVEMIR) 100 UNIT/ML injection Inject 0.15 mLs (15 Units total) into the skin at bedtime. 08/11/12   Ripudeep Krystal Eaton, MD  metoCLOPramide (REGLAN) 5 MG tablet Take 5 mg by mouth 2 (two) times daily before a  meal.    Historical Provider, MD  metoprolol (LOPRESSOR) 50 MG tablet Take 1 tablet (50 mg total) by mouth 2 (two) times daily. 10/20/12   Allie Bossier, MD  ondansetron (ZOFRAN) 4 MG tablet Take 1 tablet (4 mg total) by mouth every 6 (six) hours as needed for nausea. 08/11/12   Ripudeep Krystal Eaton, MD  oxyCODONE (OXY IR/ROXICODONE) 5 MG immediate release tablet Take 1-2 tablets (5-10 mg total) by mouth every 4 (four) hours as needed. 10/20/12   Allie Bossier, MD  pantoprazole (PROTONIX) 40 MG tablet Take 1 tablet (40 mg total) by mouth daily at 6 (six) AM. 08/11/12   Ripudeep Krystal Eaton, MD  polyethylene glycol (MIRALAX / GLYCOLAX) packet Take 17 g by mouth daily. 08/11/12   Ripudeep Krystal Eaton, MD  promethazine (PHENERGAN) 25 MG suppository Place 25 mg rectally every 6 (six) hours as needed for nausea.    Historical Provider, MD   BP 185/82 mmHg  Pulse 73  Temp(Src) 98.8 F (37.1 C) (Oral)  Resp 17  Ht 5\' 11"  (1.803 m)  Wt 185 lb (83.915 kg)  BMI 25.81 kg/m2  SpO2 100% Physical Exam  Constitutional: He is oriented to person, place, and time. He appears well-developed and well-nourished. No distress.  HENT:  Head: Normocephalic and atraumatic.  Mouth/Throat: Oropharynx is clear and moist.  Eyes: Conjunctivae are normal. Pupils are equal, round, and reactive to light. No scleral icterus.  Neck: Neck supple.  Cardiovascular: Normal rate, regular rhythm, normal heart sounds and intact distal pulses.   No murmur heard. Pulmonary/Chest: Effort normal and breath sounds normal. No stridor. No respiratory distress. He has no wheezes. He has no rales.  Abdominal: Soft. He exhibits no distension. There is no tenderness.  Musculoskeletal: Normal range of motion. He exhibits no edema.  Bilateral BKAs are not  distal leg is not tender, not swollen, no erythematous, no mass, no wounds.     Neurological: He is alert and oriented to person, place, and time.  Skin: Skin is warm and dry. No rash noted.  Psychiatric: He  has a normal mood and affect. His behavior is normal.  Nursing note and vitals reviewed.   ED Course  Procedures (including critical care time) Labs Review Labs Reviewed  CBG MONITORING, ED    Imaging Review No results found. I have personally reviewed and evaluated these images and lab results as part of my medical decision-making.   EKG Interpretation None      MDM   Final diagnoses:  Right leg pain    Pt has right BKA pain.  No evidence of infection.  It is worse when he ambulates and says he has been doing that more frequently the past few days.  Advised him to watch for  signs of infection and follow up with a primary doctor.  He was also advised to take his BP medications (has not had them today.)     Serita Grit, MD 12/17/14 1705

## 2015-04-30 ENCOUNTER — Emergency Department (INDEPENDENT_AMBULATORY_CARE_PROVIDER_SITE_OTHER)
Admission: EM | Admit: 2015-04-30 | Discharge: 2015-04-30 | Disposition: A | Discharge status: Home / Self Care | Payer: Medicare Other | Source: Home / Self Care | Attending: Family Medicine | Admitting: Family Medicine

## 2015-04-30 DIAGNOSIS — H1131 Conjunctival hemorrhage, right eye: Secondary | ICD-10-CM | POA: Diagnosis not present

## 2015-04-30 NOTE — Discharge Instructions (Signed)
Subconjunctival Hemorrhage Subconjunctival hemorrhage is bleeding that happens between the white part of your eye (sclera) and the clear membrane that covers the outside of your eye (conjunctiva). There are many tiny blood vessels near the surface of your eye. A subconjunctival hemorrhage happens when one or more of these vessels breaks and bleeds, causing a red patch to appear on your eye. This is similar to a bruise. Depending on the amount of bleeding, the red patch may only cover a small area of your eye or it may cover the entire visible part of the sclera. If a lot of blood collects under the conjunctiva, there may also be swelling. Subconjunctival hemorrhages do not affect your vision or cause pain, but your eye may feel irritated if there is swelling. Subconjunctival hemorrhages usually do not require treatment, and they disappear on their own within two weeks. CAUSES This condition may be caused by:  Mild trauma, such as rubbing your eye too hard.  Severe trauma or blunt injuries.  Coughing, sneezing, or vomiting.  Straining, such as when lifting a heavy object.  High blood pressure.  Recent eye surgery.  A history of diabetes.  Certain medicines, especially blood thinners (anticoagulants).  Other conditions, such as eye tumors, bleeding disorders, or blood vessel abnormalities. Subconjunctival hemorrhages can happen without an obvious cause.  SYMPTOMS  Symptoms of this condition include:  A bright red or dark red patch on the white part of the eye.  The red area may spread out to cover a larger area of the eye before it goes away.  The red area may turn brownish-yellow before it goes away.  Swelling.  Mild eye irritation. DIAGNOSIS This condition is diagnosed with a physical exam. If your subconjunctival hemorrhage was caused by trauma, your health care provider may refer you to an eye specialist (ophthalmologist) or another specialist to check for other injuries. You  may have other tests, including:  An eye exam.  A blood pressure check.  Blood tests to check for bleeding disorders. If your subconjunctival hemorrhage was caused by trauma, X-rays or a CT scan may be done to check for other injuries. TREATMENT Usually, no treatment is needed. Your health care provider may recommend eye drops or cold compresses to help with discomfort. HOME CARE INSTRUCTIONS  Take over-the-counter and prescription medicines only as directed by your health care provider.  Use eye drops or cold compresses to help with discomfort as directed by your health care provider.  Avoid activities, things, and environments that may irritate or injure your eye.  Keep all follow-up visits as told by your health care provider. This is important. SEEK MEDICAL CARE IF:  You have pain in your eye.  The bleeding does not go away within 3 weeks.  You keep getting new subconjunctival hemorrhages. SEEK IMMEDIATE MEDICAL CARE IF:  Your vision changes or you have difficulty seeing.  You suddenly develop severe sensitivity to light.  You develop a severe headache, persistent vomiting, confusion, or abnormal tiredness (lethargy).  Your eye seems to bulge or protrude from your eye socket.  You develop unexplained bruises on your body.  You have unexplained bleeding in another area of your body.   This information is not intended to replace advice given to you by your health care provider. Make sure you discuss any questions you have with your health care provider.   Document Released: 02/22/2005 Document Revised: 11/13/2014 Document Reviewed: 05/01/2014 Elsevier Interactive Patient Education [employer].

## 2015-04-30 NOTE — ED Provider Notes (Signed)
CSN: [id number]     Arrival date & time 04/30/15  1307 History   First MD Initiated Contact with Patient 04/30/15 1354     No chief complaint on file.  (Consider location/radiation/quality/duration/timing/severity/associated sxs/prior Treatment) HPI  Past Medical History  Diagnosis Date  . Hypertension   . Stroke [facility]) 2009    memory loss  . Myocardial infarction ([place])   . Hx of seasonal allergies   . GERD (gastroesophageal reflux disease)   . High cholesterol   . Exertional shortness of breath     "before my last OR; I'm fine now" (09/26/2012)  . Type II diabetes mellitus [facility])    Past Surgical History  Procedure Laterality Date  . 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 Amputation Right 07/29/2012    Procedure: AMPUTATION 3RD TOE;  Surgeon: Kerin Salen, MD;  Location: [place];  Service: Orthopedics;  Laterality: Right;  right third toe amputation  . Amputation Right 08/02/2012    Procedure: right transmetatarsal amputation;  Surgeon: Kerin Salen, MD;  Location: [place];  Service: Orthopedics;  Laterality: Right;  . Cataract extraction w/ intraocular lens  implant, bilateral  ?2010  . Amputation Right 09/06/2012    Procedure: AMPUTATION BELOW KNEE;  Surgeon: Kerin Salen, MD;  Location: [place];  Service: Orthopedics;  Laterality: Right;  . Peripherally inserted central catheter insertion Right 09/2012    upper arm  . Amputation Left 09/27/2012    Procedure: AMPUTATION BELOW KNEE;  Surgeon: Kerin Salen, MD;  Location: [place];  Service: Orthopedics;  Laterality: Left;  . Esophagogastroduodenoscopy N/A 10/08/2012    Procedure: ESOPHAGOGASTRODUODENOSCOPY (EGD);  Surgeon: Winfield Cunas., MD;  Location[facility] END[facility]COPY;  Service: Endoscopy;  Laterality: N/A;   No family history on file. Social History  Substance Use Topics  . Smoking status: Former Smoker -- 0.12 packs/day for 20 years    Types: Cigarettes  . Smokeless tobacco: Never Used  . Alcohol Use: No     Comment:  last drink 2010    Review of Systems  Allergies  Review of patient's allergies indicates no known allergies.  Home Medications   Prior to Admission medications   Medication Sig Start Date End Date Taking? Authorizing Provider  acetaminophen (TYLENOL) 325 MG tablet Take 2 tablets (650 mg total) by mouth every 6 (six) hours as needed. 08/11/12   Ripudeep Krystal Eaton, MD  aspirin 325 MG tablet Take 1 tablet (325 mg total) by mouth 2 (two) times daily. 10/20/12   Allie Bossier, MD  darbepoetin [facility]) 150 MCG/0.3ML SOLN injection Inject 0.3 mL (150 mcg total) into the skin every Monday at 6 PM. 10/20/12   Allie Bossier, MD  FLUoxetine (PROZAC) 40 MG capsule Take 1 capsule (40 mg total) by mouth daily. 10/20/12   Allie Bossier, MD  hydrALAZINE (APRESOLINE) 50 MG tablet Take 1 tablet (50 mg total) by mouth every 8 (eight) hours. 08/11/12   Ripudeep Krystal Eaton, MD  HYDROcodone-acetaminophen (NORCO/VICODIN) 5-325 MG tablet Take 1-2 tablets by mouth every 6 (six) hours as needed. 12/17/14   Serita Grit, MD  insulin aspart (NOVOLOG) 100 UNIT/ML injection Inject 4 Units into the skin 3 (three) times daily with meals. 08/11/12   Ripudeep Krystal Eaton, MD  insulin detemir (LEVEMIR) 100 UNIT/ML injection Inject 0.15 mLs (15 Units total) into the skin at bedtime. 08/11/12   Ripudeep Krystal Eaton, MD  metoCLOPramide (REGLAN) 5 MG tablet Take 5 mg  by mouth 2 (two) times daily before a meal.    Historical Provider, MD  metoprolol (LOPRESSOR) 50 MG tablet Take 1 tablet (50 mg total) by mouth 2 (two) times daily. 10/20/12   Allie Bossier, MD  ondansetron (ZOFRAN) 4 MG tablet Take 1 tablet (4 mg total) by mouth every 6 (six) hours as needed for nausea. 08/11/12   Ripudeep Krystal Eaton, MD  oxyCODONE (OXY IR/ROXICODONE) 5 MG immediate release tablet Take 1-2 tablets (5-10 mg total) by mouth every 4 (four) hours as needed. 10/20/12   Allie Bossier, MD  pantoprazole (PROTONIX) 40 MG tablet Take 1 tablet (40 mg total) by mouth daily at 6 (six) AM. 08/11/12    Ripudeep Krystal Eaton, MD  polyethylene glycol (MIRALAX / GLYCOLAX) packet Take 17 g by mouth daily. 08/11/12   Ripudeep Krystal Eaton, MD  promethazine (PHENERGAN) 25 MG suppository Place 25 mg rectally every 6 (six) hours as needed for nausea.    Historical Provider, MD   Meds Ordered and Administered this Visit  Medications - No data to display  BP 186/81 mmHg  Pulse 70  Temp(Src) 98 F (36.7 C) (Oral)  Resp 16  SpO2 100% No data found.   Physical Exam  Constitutional: He is oriented to person, place, and time. He appears well-developed and well-nourished.  HENT:  Head: Normocephalic and atraumatic.  Eyes:    Pulmonary/Chest: Effort normal.  Neurological: He is alert and oriented to person, place, and time.  Skin: Skin is warm and dry.  Psychiatric: He has a normal mood and affect. His behavior is normal.    ED Course  Procedures (including critical care time)  Labs Review Labs Reviewed - No data to display  Imaging Review No results found.   Visual Acuity Review  Right Eye Distance:   Left Eye Distance:   Bilateral Distance:    Right Eye Near:   Left Eye Near:    Bilateral Near:        Reassurance provided to patient that eye was not in danger MDM   1. [facility] (subconjunctival hemorrhage), right    Patient is advised to continue home symptomatic treatment.  Patient is advised that if there are new or worsening symptoms or attend the emergency department, or contact primary care provider. Instructions of care provided discharged home in stable condition. Return to work/school note provided.  THIS NOTE WAS GENERATED USING A VOICE RECOGNITION SOFTWARE PROGRAM. ALL REASONABLE EFFORTS  WERE MADE TO PROOFREAD THIS DOCUMENT FOR ACCURACY.     Konrad Felix, PA 04/30/15 1413

## 2016-06-22 ENCOUNTER — Emergency Department (HOSPITAL_COMMUNITY)
Admission: EM | Admit: 2016-06-22 | Discharge: 2016-06-22 | Disposition: A | Discharge status: Home / Self Care | Payer: Medicare Other | Attending: Emergency Medicine | Admitting: Emergency Medicine

## 2016-06-22 ENCOUNTER — Encounter (HOSPITAL_COMMUNITY): Payer: Self-pay

## 2016-06-22 DIAGNOSIS — I5022 Chronic systolic (congestive) heart failure: Secondary | ICD-10-CM | POA: Diagnosis not present

## 2016-06-22 DIAGNOSIS — L309 Dermatitis, unspecified: Secondary | ICD-10-CM | POA: Diagnosis not present

## 2016-06-22 DIAGNOSIS — I252 Old myocardial infarction: Secondary | ICD-10-CM | POA: Diagnosis not present

## 2016-06-22 DIAGNOSIS — Z951 Presence of aortocoronary bypass graft: Secondary | ICD-10-CM | POA: Insufficient documentation

## 2016-06-22 DIAGNOSIS — I110 Hypertensive heart disease with heart failure: Secondary | ICD-10-CM | POA: Diagnosis not present

## 2016-06-22 DIAGNOSIS — R21 Rash and other nonspecific skin eruption: Secondary | ICD-10-CM | POA: Diagnosis present

## 2016-06-22 DIAGNOSIS — Z794 Long term (current) use of insulin: Secondary | ICD-10-CM | POA: Insufficient documentation

## 2016-06-22 DIAGNOSIS — Z7982 Long term (current) use of aspirin: Secondary | ICD-10-CM | POA: Insufficient documentation

## 2016-06-22 DIAGNOSIS — Z87891 Personal history of nicotine dependence: Secondary | ICD-10-CM | POA: Diagnosis not present

## 2016-06-22 DIAGNOSIS — Z79899 Other long term (current) drug therapy: Secondary | ICD-10-CM | POA: Diagnosis not present

## 2016-06-22 DIAGNOSIS — Z8673 Personal history of transient ischemic attack (TIA), and cerebral infarction without residual deficits: Secondary | ICD-10-CM | POA: Diagnosis not present

## 2016-06-22 DIAGNOSIS — E119 Type 2 diabetes mellitus without complications: Secondary | ICD-10-CM | POA: Diagnosis not present

## 2016-06-22 MED ORDER — TRIAMCINOLONE ACETONIDE 0.1 % EX CREA: 1.0000 "application " | TOPICAL_CREAM | Freq: Two times a day (BID) | CUTANEOUS | 0 refills | Status: DC

## 2016-06-22 MED ORDER — FLUTICASONE PROPIONATE 50 MCG/ACT NA SUSP: 1.0000 | Freq: Every day | NASAL | 2 refills | Status: DC

## 2016-06-22 MED ORDER — CETIRIZINE HCL 10 MG PO TABS: 10.0000 mg | ORAL_TABLET | Freq: Every day | ORAL | 0 refills | Status: DC

## 2016-06-22 MED ORDER — DIPHENHYDRAMINE HCL 25 MG PO CAPS
25.0000 mg | ORAL_CAPSULE | Freq: Once | ORAL | Status: AC
  Administered 2016-06-22: 25 mg via ORAL
  Filled 2016-06-22: qty 1

## 2016-06-22 NOTE — ED Triage Notes (Signed)
Per Pt, Pt is coming from home with rash and dry skin x 1 month. Reports using home remedies with no relief.

## 2016-06-22 NOTE — ED Provider Notes (Signed)
[place] DEPT Provider Note   CSN: [id number] Arrival date & time: 06/22/16  1508  By signing my name below, I, Evelene Croon, attest that this documentation has been prepared under the direction and in the presence of Avie Echevaria, [place]. Electronically Signed: Evelene Croon, Scribe. 06/22/2016. 4:47 PM.  History   Chief Complaint Chief Complaint  Patient presents with  . Rash    The history is provided by the patient and medical records. No language interpreter was used.    HPI Comments:  [name] is a 66 y.o. male who presents to the Emergency Department complaining of a pruritic rash to his bilateral forearms  X ~ 1 month. He denies use of new detergents, creams/lotions, and states he has not recently been in the woods. He has applied alcohol and baby oil to site without improvement; states the site appears worse. Pt denies fever and chills. He states his congestion symptoms improve for a bit and then return. He denies h/o asthma. Pt also denies h/o seasonal allergies but his medical record notes h/o seasonal allergies.   Past Medical History:  Diagnosis Date  . Exertional shortness of breath    "before my last OR; I'm fine now" (09/26/2012)  . GERD (gastroesophageal reflux disease)   . High cholesterol   . Hx of seasonal allergies   . Hypertension   . Myocardial infarction ([place])   . Stroke [facility]) 2009   memory loss  . Type II diabetes mellitus [facility])     Patient Active Problem List   Diagnosis Date Noted  . Chronic systolic heart failure ([place]) 10/24/2012  . Diabetic gastroparesis associated with type 2 diabetes mellitus ([place]) 10/24/2012  . Major depressive disorder, recurrent episode, moderate ([place]) 10/05/2012  . Fever 09/26/2012  . UTI (lower urinary tract infection) 09/26/2012  . Wound eschar of foot 09/26/2012  . Poor appetite 09/21/2012  . Nausea with vomiting 09/21/2012  . Diabetes mellitus type 2, uncontrolled, with complications ([place]) 92/42/6834  .  Type II or unspecified type diabetes mellitus  09/06/2012  . Acute kidney injury ([place]) 09/06/2012  . Foot ulcer, right ([place]) 09/06/2012  . DM (diabetes mellitus) type 2, uncontrolled, with ketoacidosis ([place]) 07/30/2012  . DKA (diabetic ketoacidoses) ([place]) 07/27/2012  . Sepsis ([place]) 07/27/2012  . Gangrene of foot Left 07/27/2012  . 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 H/O: CVA (cerebrovascular accident) 07/27/2012  . Tobacco abuse 07/27/2012  . Alcohol abuse 07/27/2012  . Hypertension 07/27/2012  . Anemia 07/27/2012    Past Surgical History:  Procedure Laterality Date  . AMPUTATION Right 07/29/2012   Procedure: AMPUTATION 3RD TOE;  Surgeon: Kerin Salen, MD;  Location: [place];  Service: Orthopedics;  Laterality: Right;  right third toe amputation  . AMPUTATION Right 08/02/2012   Procedure: right transmetatarsal amputation;  Surgeon: Kerin Salen, MD;  Location: [place];  Service: Orthopedics;  Laterality: Right;  . AMPUTATION Right 09/06/2012   Procedure: AMPUTATION BELOW KNEE;  Surgeon: Kerin Salen, MD;  Location: [place];  Service: Orthopedics;  Laterality: Right;  . AMPUTATION Left 09/27/2012   Procedure: AMPUTATION BELOW KNEE;  Surgeon: Kerin Salen, MD;  Location: [place];  Service: Orthopedics;  Laterality: Left;  . CATARACT EXTRACTION W/ INTRAOCULAR LENS  IMPLANT, BILATERAL  ?2010  . 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 ESOPHAGOGASTRODUODENOSCOPY N/A 10/08/2012   Procedure: ESOPHAGOGASTRODUODENOSCOPY (EGD);  Surgeon: Winfield Cunas., MD;  Location[facility] ENDOSCOPY;  Service: Endoscopy;  Laterality:  N/A;  . PERIPHERALLY INSERTED CENTRAL CATHETER INSERTION Right 09/2012   upper arm       Home Medications    Prior to Admission medications   Medication Sig Start Date End Date Taking? Authorizing Provider  acetaminophen (TYLENOL) 325 MG tablet Take 2 tablets (650 mg total) by mouth every 6 (six) hours as needed. 08/11/12   Ripudeep Krystal Eaton, MD  aspirin 325 MG tablet Take 1 tablet (325  mg total) by mouth 2 (two) times daily. 10/20/12   Allie Bossier, MD  cetirizine (ZYRTEC ALLERGY) 10 MG tablet Take 1 tablet (10 mg total) by mouth daily. 06/22/16   Emeline General, PA-C  darbepoetin (ARANESP) 150 MCG/0.3ML SOLN injection Inject 0.3 mL (150 mcg total) into the skin every Monday at 6 PM. 10/20/12   Allie Bossier, MD  FLUoxetine (PROZAC) 40 MG capsule Take 1 capsule (40 mg total) by mouth daily. 10/20/12   Allie Bossier, MD  fluticasone (FLONASE) 50 MCG/ACT nasal spray Place 1 spray into both nostrils daily. 06/22/16   Emeline General, PA-C  hydrALAZINE (APRESOLINE) 50 MG tablet Take 1 tablet (50 mg total) by mouth every 8 (eight) hours. 08/11/12   Ripudeep Krystal Eaton, MD  HYDROcodone-acetaminophen (NORCO/VICODIN) 5-325 MG tablet Take 1-2 tablets by mouth every 6 (six) hours as needed. 12/17/14   Serita Grit, MD  insulin aspart (NOVOLOG) 100 UNIT/ML injection Inject 4 Units into the skin 3 (three) times daily with meals. 08/11/12   Ripudeep Krystal Eaton, MD  insulin detemir (LEVEMIR) 100 UNIT/ML injection Inject 0.15 mLs (15 Units total) into the skin at bedtime. 08/11/12   Ripudeep Krystal Eaton, MD  metoCLOPramide (REGLAN) 5 MG tablet Take 5 mg by mouth 2 (two) times daily before a meal.    Historical Provider, MD  metoprolol (LOPRESSOR) 50 MG tablet Take 1 tablet (50 mg total) by mouth 2 (two) times daily. 10/20/12   Allie Bossier, MD  ondansetron (ZOFRAN) 4 MG tablet Take 1 tablet (4 mg total) by mouth every 6 (six) hours as needed for nausea. 08/11/12   Ripudeep Krystal Eaton, MD  oxyCODONE (OXY IR/ROXICODONE) 5 MG immediate release tablet Take 1-2 tablets (5-10 mg total) by mouth every 4 (four) hours as needed. 10/20/12   Allie Bossier, MD  pantoprazole (PROTONIX) 40 MG tablet Take 1 tablet (40 mg total) by mouth daily at 6 (six) AM. 08/11/12   Ripudeep Krystal Eaton, MD  polyethylene glycol (MIRALAX / GLYCOLAX) packet Take 17 g by mouth daily. 08/11/12   Ripudeep Krystal Eaton, MD  promethazine (PHENERGAN) 25 MG suppository  Place 25 mg rectally every 6 (six) hours as needed for nausea.    Historical Provider, MD  triamcinolone cream (KENALOG) 0.1 % Apply 1 application topically 2 (two) times daily. 06/22/16   Emeline General, PA-C    Family History No family history on file.  Social History Social History  Substance Use Topics  . Smoking status: Former Smoker    Packs/day: 0.12    Years: 20.00    Types: Cigarettes  . Smokeless tobacco: Never Used  . Alcohol use No     Comment: last drink 2010     Allergies   Patient has no known allergies.   Review of Systems Review of Systems  Constitutional: Negative for chills and fever.  HENT: Positive for congestion and rhinorrhea.   Eyes: Negative for pain and redness.  Respiratory: Negative for shortness of breath and wheezing.   Cardiovascular: Negative for  chest pain.  Musculoskeletal: Negative for neck pain and neck stiffness.  Skin: Positive for rash.    Physical Exam Updated Vital Signs BP (!) 146/77 (BP Location: Left Arm)   Pulse 96   Temp 98.7 F (37.1 C) (Oral)   Resp 20   Ht 5' 9.5" (1.765 m)   Wt 81.6 kg   SpO2 100%   BMI 26.20 kg/m   Physical Exam  Constitutional: He is oriented to person, place, and time. He appears well-developed and well-nourished. No distress.  Patient is afebrile, non-toxic appearing, seating comfortably in chair in no acute distress.  HENT:  Head: Normocephalic and atraumatic.  Eyes: Conjunctivae are normal.  Cardiovascular: Normal rate, regular rhythm and normal heart sounds.   Pulmonary/Chest: Effort normal and breath sounds normal. No respiratory distress.  Abdominal: He exhibits no distension.  Neurological: He is alert and oriented to person, place, and time.  Skin: Skin is warm and dry.  maculopapular rash and erythema of BUE from wrist to elbow with areas of scaly plaque bilaterally  Psychiatric: He has a normal mood and affect.  Nursing note and vitals reviewed.    ED Treatments /  Results  DIAGNOSTIC STUDIES:   Oxygen Saturation is 100% on RA, normal by my interpretation.    COORDINATION OF CARE:  4:09 PM Discussed treatment plan with pt at bedside and pt agreed to plan.  Labs (all labs ordered are listed, but only abnormal results are displayed) Labs Reviewed - No data to display  EKG  EKG Interpretation None       Radiology No results found.  Procedures Procedures (including critical care time)  Medications Ordered in ED Medications  diphenhydrAMINE (BENADRYL) capsule 25 mg (not administered)     Initial Impression / Assessment and Plan / ED Course  I have reviewed the triage vital signs and the nursing notes.  Pertinent labs & imaging results that were available during my care of the patient were reviewed by me and considered in my medical decision making (see chart for details).     Patient with dermatitis. Instructed to avoid offending agent and to use unscented soaps, lotions, and detergents. Will treat with triamcinolone.  No signs of secondary infection. Follow up with PCP, Dermatology in 2-3 days. Return precautions discussed. Pt is safe for discharge at this time.  Congestion symptoms consistent with seasonal allergies, recommended antihistamine and nasal spray.  Discussed strict return precautions and advised to return to the emergency department if experiencing any new or worsening symptoms. Instructions were understood and patient agreed with discharge plan.  Final Clinical Impressions(s) / ED Diagnoses   Final diagnoses:  Dermatitis    New Prescriptions New Prescriptions   CETIRIZINE (ZYRTEC ALLERGY) 10 MG TABLET    Take 1 tablet (10 mg total) by mouth daily.   FLUTICASONE (FLONASE) 50 MCG/ACT NASAL SPRAY    Place 1 spray into both nostrils daily.   TRIAMCINOLONE CREAM (KENALOG) 0.1 %    Apply 1 application topically 2 (two) times daily.   I personally performed the services described in this documentation, which was  scribed in my presence. The recorded information has been reviewed and is accurate.    Emeline General, PA-C 06/22/16 [place], MD 06/23/16 [phone]

## 2016-06-22 NOTE — Discharge Instructions (Signed)
As discussed, keep the area well moisturized and covered with an emolient cream such as aquafor.  Zyrtec and nasal spray for your allergies. Follow up with your primary care provider. Return to the emergency department if you experience worsening of your rash, fever, chills, shortness of breath or any new concerning symptoms.

## 2017-06-15 DIAGNOSIS — I129 Hypertensive chronic kidney disease with stage 1 through stage 4 chronic kidney disease, or unspecified chronic kidney disease: Secondary | ICD-10-CM | POA: Diagnosis not present

## 2017-06-15 DIAGNOSIS — Z951 Presence of aortocoronary bypass graft: Secondary | ICD-10-CM | POA: Diagnosis not present

## 2017-06-15 DIAGNOSIS — F17290 Nicotine dependence, other tobacco product, uncomplicated: Secondary | ICD-10-CM | POA: Diagnosis not present

## 2017-06-15 DIAGNOSIS — I739 Peripheral vascular disease, unspecified: Secondary | ICD-10-CM | POA: Diagnosis not present

## 2017-06-15 DIAGNOSIS — I2510 Atherosclerotic heart disease of native coronary artery without angina pectoris: Secondary | ICD-10-CM | POA: Diagnosis not present

## 2017-06-15 DIAGNOSIS — E785 Hyperlipidemia, unspecified: Secondary | ICD-10-CM | POA: Diagnosis not present

## 2017-06-15 DIAGNOSIS — N189 Chronic kidney disease, unspecified: Secondary | ICD-10-CM | POA: Diagnosis not present

## 2017-09-07 DIAGNOSIS — Z89511 Acquired absence of right leg below knee: Secondary | ICD-10-CM | POA: Diagnosis not present

## 2017-09-07 DIAGNOSIS — Z89512 Acquired absence of left leg below knee: Secondary | ICD-10-CM | POA: Diagnosis not present

## 2017-09-15 DIAGNOSIS — E785 Hyperlipidemia, unspecified: Secondary | ICD-10-CM | POA: Diagnosis not present

## 2017-09-15 DIAGNOSIS — I739 Peripheral vascular disease, unspecified: Secondary | ICD-10-CM | POA: Diagnosis not present

## 2017-09-15 DIAGNOSIS — I2510 Atherosclerotic heart disease of native coronary artery without angina pectoris: Secondary | ICD-10-CM | POA: Diagnosis not present

## 2017-09-15 DIAGNOSIS — F17290 Nicotine dependence, other tobacco product, uncomplicated: Secondary | ICD-10-CM | POA: Diagnosis not present

## 2017-09-15 DIAGNOSIS — Z951 Presence of aortocoronary bypass graft: Secondary | ICD-10-CM | POA: Diagnosis not present

## 2017-09-15 DIAGNOSIS — E119 Type 2 diabetes mellitus without complications: Secondary | ICD-10-CM | POA: Diagnosis not present

## 2017-09-15 DIAGNOSIS — N189 Chronic kidney disease, unspecified: Secondary | ICD-10-CM | POA: Diagnosis not present

## 2017-09-15 DIAGNOSIS — I10 Essential (primary) hypertension: Secondary | ICD-10-CM | POA: Diagnosis not present

## 2017-09-16 DIAGNOSIS — I10 Essential (primary) hypertension: Secondary | ICD-10-CM | POA: Diagnosis not present

## 2017-09-16 DIAGNOSIS — R7303 Prediabetes: Secondary | ICD-10-CM | POA: Diagnosis not present

## 2017-09-16 DIAGNOSIS — E785 Hyperlipidemia, unspecified: Secondary | ICD-10-CM | POA: Diagnosis not present

## 2017-09-16 DIAGNOSIS — I2510 Atherosclerotic heart disease of native coronary artery without angina pectoris: Secondary | ICD-10-CM | POA: Diagnosis not present

## 2017-12-28 DIAGNOSIS — I10 Essential (primary) hypertension: Secondary | ICD-10-CM | POA: Diagnosis not present

## 2017-12-28 DIAGNOSIS — I2510 Atherosclerotic heart disease of native coronary artery without angina pectoris: Secondary | ICD-10-CM | POA: Diagnosis not present

## 2017-12-28 DIAGNOSIS — Z951 Presence of aortocoronary bypass graft: Secondary | ICD-10-CM | POA: Diagnosis not present

## 2017-12-28 DIAGNOSIS — I739 Peripheral vascular disease, unspecified: Secondary | ICD-10-CM | POA: Diagnosis not present

## 2017-12-28 DIAGNOSIS — F17290 Nicotine dependence, other tobacco product, uncomplicated: Secondary | ICD-10-CM | POA: Diagnosis not present

## 2017-12-28 DIAGNOSIS — N189 Chronic kidney disease, unspecified: Secondary | ICD-10-CM | POA: Diagnosis not present

## 2017-12-28 DIAGNOSIS — E1122 Type 2 diabetes mellitus with diabetic chronic kidney disease: Secondary | ICD-10-CM | POA: Diagnosis not present

## 2017-12-28 DIAGNOSIS — E785 Hyperlipidemia, unspecified: Secondary | ICD-10-CM | POA: Diagnosis not present

## 2018-04-19 DIAGNOSIS — I2510 Atherosclerotic heart disease of native coronary artery without angina pectoris: Secondary | ICD-10-CM | POA: Diagnosis not present

## 2018-04-19 DIAGNOSIS — F17290 Nicotine dependence, other tobacco product, uncomplicated: Secondary | ICD-10-CM | POA: Diagnosis not present

## 2018-04-19 DIAGNOSIS — Z951 Presence of aortocoronary bypass graft: Secondary | ICD-10-CM | POA: Diagnosis not present

## 2018-04-19 DIAGNOSIS — E1122 Type 2 diabetes mellitus with diabetic chronic kidney disease: Secondary | ICD-10-CM | POA: Diagnosis not present

## 2018-04-19 DIAGNOSIS — I10 Essential (primary) hypertension: Secondary | ICD-10-CM | POA: Diagnosis not present

## 2018-04-19 DIAGNOSIS — N189 Chronic kidney disease, unspecified: Secondary | ICD-10-CM | POA: Diagnosis not present

## 2018-04-19 DIAGNOSIS — E785 Hyperlipidemia, unspecified: Secondary | ICD-10-CM | POA: Diagnosis not present

## 2018-08-18 ENCOUNTER — Emergency Department (HOSPITAL_COMMUNITY): Payer: Medicare HMO

## 2018-08-18 ENCOUNTER — Other Ambulatory Visit: Payer: Self-pay

## 2018-08-18 ENCOUNTER — Encounter (HOSPITAL_COMMUNITY): Payer: Self-pay | Admitting: Emergency Medicine

## 2018-08-18 ENCOUNTER — Inpatient Hospital Stay (HOSPITAL_COMMUNITY)
Admission: EM | Admit: 2018-08-18 | Discharge: 2018-08-23 | DRG: 871 | Disposition: A | Discharge status: Home / Self Care | Payer: Medicare HMO | Attending: Family Medicine | Admitting: Family Medicine

## 2018-08-18 DIAGNOSIS — Z951 Presence of aortocoronary bypass graft: Secondary | ICD-10-CM | POA: Diagnosis not present

## 2018-08-18 DIAGNOSIS — E7800 Pure hypercholesterolemia, unspecified: Secondary | ICD-10-CM | POA: Diagnosis present

## 2018-08-18 DIAGNOSIS — I2119 ST elevation (STEMI) myocardial infarction involving other coronary artery of inferior wall: Secondary | ICD-10-CM | POA: Diagnosis not present

## 2018-08-18 DIAGNOSIS — Z7982 Long term (current) use of aspirin: Secondary | ICD-10-CM | POA: Diagnosis not present

## 2018-08-18 DIAGNOSIS — I2111 ST elevation (STEMI) myocardial infarction involving right coronary artery: Secondary | ICD-10-CM | POA: Diagnosis not present

## 2018-08-18 DIAGNOSIS — R1111 Vomiting without nausea: Secondary | ICD-10-CM | POA: Diagnosis not present

## 2018-08-18 DIAGNOSIS — N179 Acute kidney failure, unspecified: Secondary | ICD-10-CM | POA: Diagnosis not present

## 2018-08-18 DIAGNOSIS — I214 Non-ST elevation (NSTEMI) myocardial infarction: Secondary | ICD-10-CM | POA: Diagnosis present

## 2018-08-18 DIAGNOSIS — I2510 Atherosclerotic heart disease of native coronary artery without angina pectoris: Secondary | ICD-10-CM | POA: Diagnosis present

## 2018-08-18 DIAGNOSIS — D638 Anemia in other chronic diseases classified elsewhere: Secondary | ICD-10-CM | POA: Diagnosis present

## 2018-08-18 DIAGNOSIS — Z79899 Other long term (current) drug therapy: Secondary | ICD-10-CM | POA: Diagnosis not present

## 2018-08-18 DIAGNOSIS — Z89511 Acquired absence of right leg below knee: Secondary | ICD-10-CM | POA: Diagnosis not present

## 2018-08-18 DIAGNOSIS — I340 Nonrheumatic mitral (valve) insufficiency: Secondary | ICD-10-CM | POA: Diagnosis not present

## 2018-08-18 DIAGNOSIS — R000 Tachycardia, unspecified: Secondary | ICD-10-CM | POA: Diagnosis not present

## 2018-08-18 DIAGNOSIS — D649 Anemia, unspecified: Secondary | ICD-10-CM | POA: Diagnosis not present

## 2018-08-18 DIAGNOSIS — Z87891 Personal history of nicotine dependence: Secondary | ICD-10-CM

## 2018-08-18 DIAGNOSIS — I130 Hypertensive heart and chronic kidney disease with heart failure and stage 1 through stage 4 chronic kidney disease, or unspecified chronic kidney disease: Secondary | ICD-10-CM | POA: Diagnosis not present

## 2018-08-18 DIAGNOSIS — N184 Chronic kidney disease, stage 4 (severe): Secondary | ICD-10-CM | POA: Diagnosis not present

## 2018-08-18 DIAGNOSIS — I5022 Chronic systolic (congestive) heart failure: Secondary | ICD-10-CM | POA: Diagnosis not present

## 2018-08-18 DIAGNOSIS — K219 Gastro-esophageal reflux disease without esophagitis: Secondary | ICD-10-CM | POA: Diagnosis present

## 2018-08-18 DIAGNOSIS — E1165 Type 2 diabetes mellitus with hyperglycemia: Secondary | ICD-10-CM | POA: Diagnosis present

## 2018-08-18 DIAGNOSIS — I4892 Unspecified atrial flutter: Secondary | ICD-10-CM | POA: Diagnosis present

## 2018-08-18 DIAGNOSIS — E876 Hypokalemia: Secondary | ICD-10-CM | POA: Diagnosis present

## 2018-08-18 DIAGNOSIS — R079 Chest pain, unspecified: Secondary | ICD-10-CM | POA: Diagnosis not present

## 2018-08-18 DIAGNOSIS — I454 Nonspecific intraventricular block: Secondary | ICD-10-CM | POA: Diagnosis present

## 2018-08-18 DIAGNOSIS — Z20828 Contact with and (suspected) exposure to other viral communicable diseases: Secondary | ICD-10-CM | POA: Diagnosis not present

## 2018-08-18 DIAGNOSIS — N183 Chronic kidney disease, stage 3 (moderate): Secondary | ICD-10-CM | POA: Diagnosis present

## 2018-08-18 DIAGNOSIS — N289 Disorder of kidney and ureter, unspecified: Secondary | ICD-10-CM

## 2018-08-18 DIAGNOSIS — A419 Sepsis, unspecified organism: Secondary | ICD-10-CM | POA: Diagnosis not present

## 2018-08-18 DIAGNOSIS — Z9841 Cataract extraction status, right eye: Secondary | ICD-10-CM

## 2018-08-18 DIAGNOSIS — I252 Old myocardial infarction: Secondary | ICD-10-CM | POA: Diagnosis not present

## 2018-08-18 DIAGNOSIS — Z89512 Acquired absence of left leg below knee: Secondary | ICD-10-CM | POA: Diagnosis not present

## 2018-08-18 DIAGNOSIS — Z8673 Personal history of transient ischemic attack (TIA), and cerebral infarction without residual deficits: Secondary | ICD-10-CM | POA: Diagnosis not present

## 2018-08-18 DIAGNOSIS — E1122 Type 2 diabetes mellitus with diabetic chronic kidney disease: Secondary | ICD-10-CM | POA: Diagnosis not present

## 2018-08-18 DIAGNOSIS — E118 Type 2 diabetes mellitus with unspecified complications: Secondary | ICD-10-CM | POA: Diagnosis not present

## 2018-08-18 DIAGNOSIS — N390 Urinary tract infection, site not specified: Secondary | ICD-10-CM | POA: Diagnosis not present

## 2018-08-18 DIAGNOSIS — R509 Fever, unspecified: Secondary | ICD-10-CM

## 2018-08-18 DIAGNOSIS — A4159 Other Gram-negative sepsis: Secondary | ICD-10-CM | POA: Diagnosis not present

## 2018-08-18 DIAGNOSIS — IMO0002 Reserved for concepts with insufficient information to code with codable children: Secondary | ICD-10-CM | POA: Diagnosis present

## 2018-08-18 DIAGNOSIS — I361 Nonrheumatic tricuspid (valve) insufficiency: Secondary | ICD-10-CM | POA: Diagnosis not present

## 2018-08-18 DIAGNOSIS — Z9842 Cataract extraction status, left eye: Secondary | ICD-10-CM

## 2018-08-18 DIAGNOSIS — I10 Essential (primary) hypertension: Secondary | ICD-10-CM | POA: Diagnosis not present

## 2018-08-18 DIAGNOSIS — Z961 Presence of intraocular lens: Secondary | ICD-10-CM | POA: Diagnosis present

## 2018-08-18 DIAGNOSIS — E785 Hyperlipidemia, unspecified: Secondary | ICD-10-CM | POA: Diagnosis present

## 2018-08-18 DIAGNOSIS — I4510 Unspecified right bundle-branch block: Secondary | ICD-10-CM | POA: Diagnosis not present

## 2018-08-18 DIAGNOSIS — N3289 Other specified disorders of bladder: Secondary | ICD-10-CM | POA: Diagnosis not present

## 2018-08-18 DIAGNOSIS — E1151 Type 2 diabetes mellitus with diabetic peripheral angiopathy without gangrene: Secondary | ICD-10-CM | POA: Diagnosis present

## 2018-08-18 DIAGNOSIS — N170 Acute kidney failure with tubular necrosis: Secondary | ICD-10-CM | POA: Diagnosis not present

## 2018-08-18 LAB — CBC
HCT: 27.5 % — ABNORMAL LOW (ref 39.0–52.0)
Hemoglobin: 8.5 g/dL — ABNORMAL LOW (ref 13.0–17.0)
MCH: 29.1 pg (ref 26.0–34.0)
MCHC: 30.9 g/dL (ref 30.0–36.0)
MCV: 94.2 fL (ref 80.0–100.0)
Platelets: 280 10*3/uL (ref 150–400)
RBC: 2.92 MIL/uL — ABNORMAL LOW (ref 4.22–5.81)
RDW: 13.2 % (ref 11.5–15.5)
WBC: 18.6 10*3/uL — ABNORMAL HIGH (ref 4.0–10.5)
nRBC: 0 % (ref 0.0–0.2)

## 2018-08-18 LAB — HEPATIC FUNCTION PANEL
ALT: 12 U/L (ref 0–44)
AST: 20 U/L (ref 15–41)
Albumin: 3.2 g/dL — ABNORMAL LOW (ref 3.5–5.0)
Alkaline Phosphatase: 62 U/L (ref 38–126)
Bilirubin, Direct: 0.2 mg/dL (ref 0.0–0.2)
Indirect Bilirubin: 0.6 mg/dL (ref 0.3–0.9)
Total Bilirubin: 0.8 mg/dL (ref 0.3–1.2)
Total Protein: 6.1 g/dL — ABNORMAL LOW (ref 6.5–8.1)

## 2018-08-18 LAB — URINALYSIS, ROUTINE W REFLEX MICROSCOPIC
Bilirubin Urine: NEGATIVE
Glucose, UA: NEGATIVE mg/dL
Ketones, ur: 5 mg/dL — AB
Nitrite: NEGATIVE
Protein, ur: >=300 mg/dL — AB
Specific Gravity, Urine: 1.011 (ref 1.005–1.030)
pH: 5 (ref 5.0–8.0)

## 2018-08-18 LAB — BASIC METABOLIC PANEL
Anion gap: 11 (ref 5–15)
BUN: 23 mg/dL (ref 8–23)
CO2: 19 mmol/L — ABNORMAL LOW (ref 22–32)
Calcium: 8.4 mg/dL — ABNORMAL LOW (ref 8.9–10.3)
Chloride: 112 mmol/L — ABNORMAL HIGH (ref 98–111)
Creatinine, Ser: 2.68 mg/dL — ABNORMAL HIGH (ref 0.61–1.24)
GFR calc Af Amer: 27 mL/min — ABNORMAL LOW (ref >60)
GFR calc non Af Amer: 23 mL/min — ABNORMAL LOW (ref >60)
Glucose, Bld: 130 mg/dL — ABNORMAL HIGH (ref 70–99)
Potassium: 3.2 mmol/L — ABNORMAL LOW (ref 3.5–5.1)
Sodium: 142 mmol/L (ref 135–145)

## 2018-08-18 LAB — MAGNESIUM: Magnesium: 1.7 mg/dL (ref 1.7–2.4)

## 2018-08-18 LAB — LACTIC ACID, PLASMA
Lactic Acid, Venous: 2 mmol/L (ref 0.5–1.9)
Lactic Acid, Venous: 1.5 mmol/L (ref 0.5–1.9)

## 2018-08-18 LAB — SARS CORONAVIRUS 2 BY RT PCR (HOSPITAL ORDER, PERFORMED IN ~~LOC~~ HOSPITAL LAB): SARS Coronavirus 2: NEGATIVE

## 2018-08-18 LAB — BRAIN NATRIURETIC PEPTIDE: B Natriuretic Peptide: 902.3 pg/mL — ABNORMAL HIGH (ref 0.0–100.0)

## 2018-08-18 LAB — CBG MONITORING, ED: Glucose-Capillary: 120 mg/dL — ABNORMAL HIGH (ref 70–99)

## 2018-08-18 LAB — TROPONIN I
Troponin I: 0.16 ng/mL (ref <0.03)
Troponin I: 4.47 ng/mL (ref <0.03)

## 2018-08-18 LAB — RAPID URINE DRUG SCREEN, HOSP PERFORMED
Amphetamines: NOT DETECTED
Barbiturates: NOT DETECTED
Benzodiazepines: NOT DETECTED
Cocaine: NOT DETECTED
Opiates: NOT DETECTED
Tetrahydrocannabinol: NOT DETECTED

## 2018-08-18 MED ORDER — LACTATED RINGERS IV BOLUS
1000.0000 mL | Freq: Once | INTRAVENOUS | Status: AC
  Administered 2018-08-18: 1000 mL via INTRAVENOUS

## 2018-08-18 MED ORDER — ONDANSETRON HCL 4 MG/2ML IJ SOLN
4.0000 mg | Freq: Four times a day (QID) | INTRAMUSCULAR | Status: DC | PRN
  Administered 2018-08-19 (×3): 4 mg via INTRAVENOUS
  Filled 2018-08-18 (×3): qty 2

## 2018-08-18 MED ORDER — ASPIRIN EC 81 MG PO TBEC: 81.0000 mg | DELAYED_RELEASE_TABLET | Freq: Every morning | ORAL | Status: DC

## 2018-08-18 MED ORDER — NITROGLYCERIN 0.4 MG SL SUBL: 0.4000 mg | SUBLINGUAL_TABLET | SUBLINGUAL | Status: DC | PRN

## 2018-08-18 MED ORDER — VANCOMYCIN HCL 10 G IV SOLR
1500.0000 mg | Freq: Once | INTRAVENOUS | Status: AC
  Administered 2018-08-18: 1500 mg via INTRAVENOUS
  Filled 2018-08-18: qty 1500

## 2018-08-18 MED ORDER — ACETAMINOPHEN 500 MG PO TABS
1000.0000 mg | ORAL_TABLET | Freq: Once | ORAL | Status: AC
  Administered 2018-08-18: 1000 mg via ORAL
  Filled 2018-08-18: qty 2

## 2018-08-18 MED ORDER — INSULIN ASPART 100 UNIT/ML ~~LOC~~ SOLN
0.0000 [IU] | Freq: Three times a day (TID) | SUBCUTANEOUS | Status: DC
  Administered 2018-08-19 (×3): 2 [IU] via SUBCUTANEOUS
  Administered 2018-08-20: 5 [IU] via SUBCUTANEOUS
  Administered 2018-08-20 – 2018-08-21 (×4): 2 [IU] via SUBCUTANEOUS
  Administered 2018-08-21 – 2018-08-22 (×2): 1 [IU] via SUBCUTANEOUS
  Administered 2018-08-22 (×2): 2 [IU] via SUBCUTANEOUS
  Administered 2018-08-23: 1 [IU] via SUBCUTANEOUS

## 2018-08-18 MED ORDER — HEPARIN (PORCINE) 25000 UT/250ML-% IV SOLN
1250.0000 [IU]/h | INTRAVENOUS | Status: DC
  Administered 2018-08-18: 1150 [IU]/h via INTRAVENOUS
  Administered 2018-08-19: 950 [IU]/h via INTRAVENOUS
  Administered 2018-08-20: 1000 [IU]/h via INTRAVENOUS
  Administered 2018-08-21: 1100 [IU]/h via INTRAVENOUS
  Filled 2018-08-18 (×4): qty 250

## 2018-08-18 MED ORDER — SODIUM CHLORIDE 0.9 % IV SOLN
2.0000 g | Freq: Once | INTRAVENOUS | Status: AC
  Administered 2018-08-18: 2 g via INTRAVENOUS
  Filled 2018-08-18: qty 2

## 2018-08-18 MED ORDER — METOPROLOL SUCCINATE ER 25 MG PO TB24: 50.0000 mg | ORAL_TABLET | Freq: Every morning | ORAL | Status: DC

## 2018-08-18 MED ORDER — SODIUM CHLORIDE 0.9 % IV SOLN
1.0000 g | INTRAVENOUS | Status: DC
  Administered 2018-08-19 – 2018-08-22 (×4): 1 g via INTRAVENOUS
  Filled 2018-08-18: qty 10
  Filled 2018-08-18: qty 1
  Filled 2018-08-18 (×3): qty 10
  Filled 2018-08-18: qty 1

## 2018-08-18 MED ORDER — HEPARIN BOLUS VIA INFUSION
4000.0000 [IU] | Freq: Once | INTRAVENOUS | Status: AC
  Administered 2018-08-18: 4000 [IU] via INTRAVENOUS
  Filled 2018-08-18: qty 4000

## 2018-08-18 MED ORDER — ADULT MULTIVITAMIN W/MINERALS CH: 1.0000 | ORAL_TABLET | Freq: Every morning | ORAL | Status: DC

## 2018-08-18 MED ORDER — HEPARIN SODIUM (PORCINE) 5000 UNIT/ML IJ SOLN: 5000.0000 [IU] | Freq: Three times a day (TID) | INTRAMUSCULAR | Status: DC

## 2018-08-18 MED ORDER — ATORVASTATIN CALCIUM 10 MG PO TABS: 10.0000 mg | ORAL_TABLET | Freq: Every morning | ORAL | Status: DC

## 2018-08-18 MED ORDER — POTASSIUM CHLORIDE CRYS ER 20 MEQ PO TBCR
30.0000 meq | EXTENDED_RELEASE_TABLET | Freq: Once | ORAL | Status: AC
  Administered 2018-08-19: 30 meq via ORAL
  Filled 2018-08-18: qty 1

## 2018-08-18 MED ORDER — TAMSULOSIN HCL 0.4 MG PO CAPS
0.4000 mg | ORAL_CAPSULE | Freq: Every morning | ORAL | Status: DC
  Administered 2018-08-18 – 2018-08-23 (×5): 0.4 mg via ORAL
  Filled 2018-08-18 (×6): qty 1

## 2018-08-18 MED ORDER — ATORVASTATIN CALCIUM 10 MG PO TABS
10.0000 mg | ORAL_TABLET | Freq: Every morning | ORAL | Status: DC
  Administered 2018-08-20 – 2018-08-21 (×2): 10 mg via ORAL
  Filled 2018-08-18 (×3): qty 1

## 2018-08-18 MED ORDER — ADULT MULTIVITAMIN W/MINERALS CH
1.0000 | ORAL_TABLET | Freq: Every morning | ORAL | Status: DC
  Administered 2018-08-20 – 2018-08-23 (×4): 1 via ORAL
  Filled 2018-08-18 (×5): qty 1

## 2018-08-18 MED ORDER — ACETAMINOPHEN 325 MG PO TABS: 650.0000 mg | ORAL_TABLET | Freq: Four times a day (QID) | ORAL | Status: DC | PRN

## 2018-08-18 MED ORDER — METOPROLOL SUCCINATE ER 50 MG PO TB24
50.0000 mg | ORAL_TABLET | Freq: Every morning | ORAL | Status: DC
  Filled 2018-08-18: qty 1

## 2018-08-18 MED ORDER — ACETAMINOPHEN 650 MG RE SUPP: 650.0000 mg | Freq: Four times a day (QID) | RECTAL | Status: DC | PRN

## 2018-08-18 MED ORDER — ONDANSETRON HCL 4 MG PO TABS: 4.0000 mg | ORAL_TABLET | Freq: Four times a day (QID) | ORAL | Status: DC | PRN

## 2018-08-18 MED ORDER — ASPIRIN EC 81 MG PO TBEC
81.0000 mg | DELAYED_RELEASE_TABLET | Freq: Every morning | ORAL | Status: DC
  Administered 2018-08-20 – 2018-08-23 (×4): 81 mg via ORAL
  Filled 2018-08-18 (×5): qty 1

## 2018-08-18 MED ORDER — INSULIN ASPART 100 UNIT/ML ~~LOC~~ SOLN: 0.0000 [IU] | SUBCUTANEOUS | Status: DC

## 2018-08-18 NOTE — ED Notes (Signed)
ED TO INPATIENT HANDOFF REPORT  ED Nurse Name and Phone #: [phone]  S Name/Age/Gender [name] 68 y.o. male Room/Bed: 039C/039C  Code Status   Code Status: Full Code  Home/SNF/Other Home Patient oriented to: self, place, time and situation Is this baseline? Yes   Triage Complete: Triage complete  Chief Complaint Chest Pain   Triage Note Brought by ems from home for c/o chest pain that started around noon.  Reports eating a biscuit this morning around 10.  Started having digestive upset around 11 with vomiting then chest pain started around 12.  Hx of quad bypass.  CBG-142.  Given Asa 324mg  and 2 SL NTG.  Reports pain is gradually going away.  Now rating 4 to 5/10.   Allergies No Known Allergies  Level of Care/Admitting Diagnosis ED Disposition    ED Disposition Condition Comment   Admit  Hospital Area: [facility] [[id number]]  Level of Care: Progressive [102]  Covid Evaluation: Confirmed COVID Negative  Diagnosis: Sepsis secondary to UTI [facility]) [[id number]]  Admitting Physician: Doreatha Massed  Attending Physician: Etta Quill [phone]  Estimated length of stay: past midnight tomorrow  Certification:: I certify this patient will need inpatient services for at least 2 midnights  PT Class (Do Not Modify): Inpatient [101]  PT Acc Code (Do Not Modify): Private [1]       B Medical/Surgery History Past Medical History:  Diagnosis Date  . Exertional shortness of breath    "before my last OR; I'm fine now" (09/26/2012)  . GERD (gastroesophageal reflux disease)   . High cholesterol   . Hx of seasonal allergies   . Hypertension   . Myocardial infarction ([place])   . Stroke [facility]) 2009   memory loss  . Type II diabetes mellitus ([place])    Past Surgical History:  Procedure Laterality Date  . AMPUTATION Right 07/29/2012   Procedure: AMPUTATION 3RD TOE;  Surgeon: Kerin Salen, MD;  Location: [place];  Service: Orthopedics;  Laterality: Right;   right third toe amputation  . AMPUTATION Right 08/02/2012   Procedure: right transmetatarsal amputation;  Surgeon: Kerin Salen, MD;  Location: [place];  Service: Orthopedics;  Laterality: Right;  . AMPUTATION Right 09/06/2012   Procedure: AMPUTATION BELOW KNEE;  Surgeon: Kerin Salen, MD;  Location: [place];  Service: Orthopedics;  Laterality: Right;  . AMPUTATION Left 09/27/2012   Procedure: AMPUTATION BELOW KNEE;  Surgeon: Kerin Salen, MD;  Location: [place];  Service: Orthopedics;  Laterality: Left;  . CATARACT EXTRACTION W/ INTRAOCULAR LENS  IMPLANT, BILATERAL  ?2010  . 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 ESOPHAGOGASTRODUODENOSCOPY N/A 10/08/2012   Procedure: ESOPHAGOGASTRODUODENOSCOPY (EGD);  Surgeon: Winfield Cunas., MD;  Location: [facility] ENDOSCOPY;  Service: Endoscopy;  Laterality: N/A;  . PERIPHERALLY INSERTED CENTRAL CATHETER INSERTION Right 09/2012   upper arm     A IV Location/Drains/Wounds Patient Lines/Drains/Airways Status   Active Line/Drains/Airways    Name:   Placement date:   Placement time:   Site:   Days:   Peripheral IV 08/18/18 Left Antecubital   08/18/18    1625    Antecubital   less than 1   Peripheral IV 08/18/18 Right Antecubital   08/18/18    2302    Antecubital   less than 1   External Urinary Catheter   10/18/12    1058    -   2130   Incision 09/06/12 Leg  Right   09/06/12    1224     2172   Incision 09/27/12 Leg Left   09/27/12    1529     2151   Wound 10/18/12 Abrasion(s) Leg Right   10/18/12    0914    Leg   2130          Intake/Output Last 24 hours  Intake/Output Summary (Last 24 hours) at 08/18/2018 2335 Last data filed at 08/18/2018 2047 Gross per 24 hour  Intake 1100 ml  Output -  Net 1100 ml    Labs/Imaging Results for orders placed or performed during the hospital encounter of 08/18/18 (from the past 48 hour(s))  CBG monitoring, ED     Status: Abnormal   Collection Time: 08/18/18  4:23 PM  Result Value Ref Range   Glucose-Capillary  120 (H) 70 - 99 mg/dL   Comment 1 Notify RN    Comment 2 Document in Chart   Troponin I - ONCE - STAT     Status: Abnormal   Collection Time: 08/18/18  4:30 PM  Result Value Ref Range   Troponin I 0.16 (HH) <0.03 ng/mL    Comment: CRITICAL RESULT CALLED TO, READ BACK BY AND VERIFIED WITH: K COBB,RN 1751 08/18/2018 D BRADLEY Performed at [facility], [place] [address], [place], [place] [address]   Brain natriuretic peptide     Status: Abnormal   Collection Time: 08/18/18  4:30 PM  Result Value Ref Range   B Natriuretic Peptide 902.3 (H) 0.0 - 100.0 pg/mL    Comment: Performed at [facility] [address] [place], [place] [address]  Basic metabolic panel     Status: Abnormal   Collection Time: 08/18/18  4:30 PM  Result Value Ref Range   Sodium 142 135 - 145 mmol/L   Potassium 3.2 (L) 3.5 - 5.1 mmol/L   Chloride 112 (H) 98 - 111 mmol/L   CO2 19 (L) 22 - 32 mmol/L   Glucose, Bld 130 (H) 70 - 99 mg/dL   BUN 23 8 - 23 mg/dL   Creatinine, Ser 2.68 (H) 0.61 - 1.24 mg/dL   Calcium 8.4 (L) 8.9 - 10.3 mg/dL   GFR calc non Af Amer 23 (L) >60 mL/min   GFR calc Af Amer 27 (L) >60 mL/min   Anion gap 11 5 - 15    Comment: Performed at [facility] [address], [place], [place] [address]  Magnesium     Status: None   Collection Time: 08/18/18  4:30 PM  Result Value Ref Range   Magnesium 1.7 1.7 - 2.4 mg/dL    Comment: Performed at [facility] [address], [place], [place] [address]  Hepatic function panel     Status: Abnormal   Collection Time: 08/18/18  4:30 PM  Result Value Ref Range   Total Protein 6.1 (L) 6.5 - 8.1 g/dL   Albumin 3.2 (L) 3.5 - 5.0 g/dL   AST 20 15 - 41 U/L   ALT 12 0 - 44 U/L   Alkaline Phosphatase 62 38 - 126 U/L   Total Bilirubin 0.8 0.3 - 1.2 mg/dL   Bilirubin, Direct 0.2 0.0 - 0.2 mg/dL   Indirect Bilirubin 0.6 0.3 - 0.9 mg/dL    Comment: Performed at [facility] [address], [place], [place] [address]  Urinalysis,  Routine w reflex microscopic     Status: Abnormal   Collection Time: 08/18/18  4:32 PM  Result Value Ref Range   Color, Urine YELLOW YELLOW   APPearance HAZY (A) CLEAR   Specific Gravity, Urine 1.011 1.005 - 1.030   pH 5.0 5.0 - 8.0   Glucose, UA NEGATIVE NEGATIVE mg/dL   Hgb urine dipstick MODERATE (A) NEGATIVE   Bilirubin Urine NEGATIVE NEGATIVE   Ketones, ur 5 (A) NEGATIVE mg/dL   Protein, ur >=300 (A) NEGATIVE mg/dL   Nitrite NEGATIVE NEGATIVE   Leukocytes,Ua MODERATE (A) NEGATIVE   RBC / HPF 6-10 0 - 5 RBC/hpf   WBC, UA 21-50 0 - 5 WBC/hpf   Bacteria, UA MANY (A) NONE SEEN   Squamous Epithelial / LPF 0-5 0 - 5   WBC Clumps PRESENT     Comment: Performed at [facility] [address], [place], [place] [address]  Rapid urine drug screen (hospital performed)     Status: None   Collection Time: 08/18/18  4:32 PM  Result Value Ref Range   Opiates NONE DETECTED NONE DETECTED   Cocaine NONE DETECTED NONE DETECTED   Benzodiazepines NONE DETECTED NONE DETECTED   Amphetamines NONE DETECTED NONE DETECTED   Tetrahydrocannabinol NONE DETECTED NONE DETECTED   Barbiturates NONE DETECTED NONE DETECTED    Comment: (NOTE) DRUG SCREEN FOR MEDICAL PURPOSES ONLY.  IF CONFIRMATION IS NEEDED FOR ANY PURPOSE, NOTIFY LAB WITHIN 5 DAYS. LOWEST DETECTABLE LIMITS FOR URINE DRUG SCREEN Drug Class                     Cutoff (ng/mL) Amphetamine and metabolites    1000 Barbiturate and metabolites    200 Benzodiazepine                 829 Tricyclics and metabolites     300 Opiates and metabolites        300 Cocaine and metabolites        300 THC                            50 Performed at [facility], [place] [address] [place], [place] [address]   Lactic acid, plasma     Status: Abnormal   Collection Time: 08/18/18  4:33 PM  Result Value Ref Range   Lactic Acid, Venous 2.0 (HH) 0.5 - 1.9 mmol/L    Comment: CRITICAL RESULT CALLED TO, READ BACK BY AND VERIFIED WITH: L MEEKS,RN 1735  08/18/2018 D BRADLEY Performed at [facility], [place] [address], [place], [place] [address]   SARS Coronavirus 2 (CEPHEID- Performed in [facility] hospital lab), Hosp Order     Status: None   Collection Time: 08/18/18  5:04 PM   Specimen: Urine, Clean Catch; Nasopharyngeal  Result Value Ref Range   SARS Coronavirus 2 NEGATIVE NEGATIVE    Comment: (NOTE) If result is NEGATIVE SARS-CoV-2 target nucleic acids are NOT DETECTED. The SARS-CoV-2 RNA is generally detectable in upper and lower  respiratory specimens during the acute phase of infection. The lowest  concentration of SARS-CoV-2 viral copies this assay can detect is 250  copies / mL. A negative result does not preclude SARS-CoV-2 infection  and should not be used as the sole basis for treatment or other  patient management decisions.  A negative result may occur with  improper specimen collection / handling, submission of specimen other  than nasopharyngeal swab, presence of viral mutation(s) within the  areas targeted by this assay, and inadequate  number of viral copies  (<250 copies / mL). A negative result must be combined with clinical  observations, patient history, and epidemiological information. If result is POSITIVE SARS-CoV-2 target nucleic acids are DETECTED. The SARS-CoV-2 RNA is generally detectable in upper and lower  respiratory specimens dur ing the acute phase of infection.  Positive  results are indicative of active infection with SARS-CoV-2.  Clinical  correlation with patient history and other diagnostic information is  necessary to determine patient infection status.  Positive results do  not rule out bacterial infection or co-infection with other viruses. If result is PRESUMPTIVE POSTIVE SARS-CoV-2 nucleic acids MAY BE PRESENT.   A presumptive positive result was obtained on the submitted specimen  and confirmed on repeat testing.  While 2019 novel coronavirus  (SARS-CoV-2) nucleic acids may be  present in the submitted sample  additional confirmatory testing may be necessary for epidemiological  and / or clinical management purposes  to differentiate between  SARS-CoV-2 and other Sarbecovirus currently known to infect humans.  If clinically indicated additional testing with an alternate test  methodology [phone]) is advised. The SARS-CoV-2 RNA is generally  detectable in upper and lower respiratory sp ecimens during the acute  phase of infection. The expected result is Negative. Fact Sheet for Patients:  StrictlyIdeas.no Fact Sheet for Healthcare Providers: BankingDealers.co.za This test is not yet approved or cleared by the [place] FDA and has been authorized for detection and/or diagnosis of SARS-CoV-2 by FDA under an Emergency Use Authorization (EUA).  This EUA will remain in effect (meaning this test can be used) for the duration of the COVID-19 declaration under Section 564(b)(1) of the Act, 21 U.S.C. section 360bbb-3(b)(1), unless the authorization is terminated or revoked sooner. Performed at [facility], [place] [address], [place], [place] [address]   Lactic acid, plasma     Status: None   Collection Time: 08/18/18  6:33 PM  Result Value Ref Range   Lactic Acid, Venous 1.5 0.5 - 1.9 mmol/L    Comment: Performed at [facility] [address], [place], [place] [address]  Troponin I - Now Then Q6H     Status: Abnormal   Collection Time: 08/18/18  9:36 PM  Result Value Ref Range   Troponin I 4.47 (HH) <0.03 ng/mL    Comment: CRITICAL RESULT CALLED TO, READ BACK BY AND VERIFIED WITH: PEREZ,P RN 08/18/2018 2250 JORDANS Performed at [facility], [place] [address] [place], [place] [address]   CBC     Status: Abnormal   Collection Time: 08/18/18  9:36 PM  Result Value Ref Range   WBC 18.6 (H) 4.0 - 10.5 K/uL   RBC 2.92 (L) 4.22 - 5.81 MIL/uL   Hemoglobin 8.5 (L) 13.0 - 17.0 g/dL   HCT 27.5 (L)  39.0 - 52.0 %   MCV 94.2 80.0 - 100.0 fL   MCH 29.1 26.0 - 34.0 pg   MCHC 30.9 30.0 - 36.0 g/dL   RDW 13.2 11.5 - 15.5 %   Platelets 280 150 - 400 K/uL   nRBC 0.0 0.0 - 0.2 %    Comment: Performed at [facility], [place] [address] [place],  [address]   Dg Chest Portable 1 View  Result Date: 08/18/2018 CLINICAL DATA:  Chest pain today with fever. EXAM: PORTABLE CHEST 1 VIEW COMPARISON:  10/10/2012 FINDINGS: Stable changes from prior CABG surgery. The cardiac silhouette is borderline enlarged. No mediastinal or hilar masses or convincing adenopathy. Clear  lungs.  No pleural effusion or pneumothorax. Old, healed left rib fracture.  No acute skeletal abnormality. IMPRESSION: No acute cardiopulmonary disease. Electronically Signed   By: Lajean Manes M.D.   On: 08/18/2018 18:25    Pending Labs Unresulted Labs (From admission, onward)    Start     Ordered   08/20/18 0500  Heparin level (unfractionated)  Daily,   R     08/18/18 2153   08/19/18 0630  Heparin level (unfractionated)  Once-Timed,   STAT     08/18/18 2153   08/19/18 0500  CBC  Tomorrow morning,   R     08/18/18 2132   08/19/18 6967  Basic metabolic panel  Tomorrow morning,   R     08/18/18 2132   08/19/18 0500  CBC  Daily,   R     08/18/18 2153   08/18/18 2130  HIV antibody (Routine Testing)  Once,   STAT     08/18/18 2132   08/18/18 2103  Troponin I - Now Then Q6H  Now then every 6 hours,   R (with STAT occurrences)     08/18/18 2102   08/18/18 1632  Blood culture (routine x 2)  BLOOD CULTURE X 2,   STAT     08/18/18 1632   08/18/18 1632  Urine culture  ONCE - STAT,   STAT     08/18/18 1632          Vitals/Pain Today's Vitals   08/18/18 2228 08/18/18 2305 08/18/18 2306 08/18/18 2307  BP:  136/75    Pulse:    (!) 101  Resp:   (!) 23   Temp:      TempSrc:      SpO2:    99%  Weight:      Height:      PainSc: 0-No pain       Isolation Precautions Droplet and Contact  precautions  Medications Medications  cefTRIAXone (ROCEPHIN) 1 g in sodium chloride 0.9 % 100 mL IVPB (has no administration in time range)  potassium chloride (K-DUR) CR tablet 30 mEq (has no administration in time range)  nitroGLYCERIN (NITROSTAT) SL tablet 0.4 mg (has no administration in time range)  tamsulosin (FLOMAX) capsule 0.4 mg (0.4 mg Oral Given 08/18/18 2226)  acetaminophen (TYLENOL) tablet 650 mg (has no administration in time range)    Or  acetaminophen (TYLENOL) suppository 650 mg (has no administration in time range)  ondansetron (ZOFRAN) tablet 4 mg (has no administration in time range)    Or  ondansetron (ZOFRAN) injection 4 mg (has no administration in time range)  metoprolol succinate (TOPROL-XL) 24 hr tablet 50 mg (has no administration in time range)  multivitamin with minerals tablet 1 tablet (has no administration in time range)  aspirin EC tablet 81 mg (has no administration in time range)  atorvastatin (LIPITOR) tablet 10 mg (has no administration in time range)  insulin aspart (novoLOG) injection 0-9 Units (has no administration in time range)  heparin ADULT infusion 100 units/mL (25000 units/219mL sodium chloride 0.45%) (1,150 Units/hr Intravenous New Bag/Given 08/18/18 2307)  lactated ringers bolus 1,000 mL (0 mLs Intravenous Stopped 08/18/18 1846)  acetaminophen (TYLENOL) tablet 1,000 mg (1,000 mg Oral Given 08/18/18 1658)  vancomycin (VANCOCIN) 1,500 mg in sodium chloride 0.9 % 500 mL IVPB (1,500 mg Intravenous New Bag/Given 08/18/18 2049)  ceFEPIme (MAXIPIME) 2 g in sodium chloride 0.9 % 100 mL IVPB (0 g Intravenous Stopped 08/18/18 2047)  heparin bolus via infusion 4,000 Units (  4,000 Units Intravenous Bolus from Bag 08/18/18 2311)    Mobility walks Low fall risk   Focused Assessments    R Recommendations: See Admitting Provider Note  Report given to:   Additional Notes:

## 2018-08-18 NOTE — Progress Notes (Signed)
[place] for heparin Indication: chest pain/ACS, possible aflutter  Heparin Dosing Weight: 81.6 kg  Labs: Recent Labs    08/18/18 1630  CREATININE 2.68*  TROPONINI 0.16*    Assessment: 20 yom presenting with CP, possible aflutter. Pharmacy consulted to dose heparin. Not on anticoagulation PTA. Noted bilateral BKA. SCr 2.68, CBC pending on admit, troponin 0.16. No active bleed issues documented.  Goal of Therapy:  Heparin level 0.3-0.7 units/ml Monitor platelets by anticoagulation protocol: Yes   Plan:  Heparin 4000 unit bolus Start heparin at 1150 units/h 8h heparin level Daily heparin level/CBC Monitor s/sx bleeding  Elicia Lamp, PharmD, BCPS Clinical Pharmacist 08/18/2018 9:47 PM

## 2018-08-18 NOTE — ED Notes (Signed)
ED TO INPATIENT HANDOFF REPORT  ED Nurse Name and Phone #:  Patty [phone]  S Name/Age/Gender [name] 68 y.o. male Room/Bed: 039C/039C  Code Status   Code Status: Prior  Home/SNF/Other Home Patient oriented to: self, place, time and situation Is this baseline? Yes   Triage Complete: Triage complete  Chief Complaint Chest Pain   Triage Note Brought by ems from home for c/o chest pain that started around noon.  Reports eating a biscuit this morning around 10.  Started having digestive upset around 11 with vomiting then chest pain started around 12.  Hx of quad bypass.  CBG-142.  Given Asa 324mg  and 2 SL NTG.  Reports pain is gradually going away.  Now rating 4 to 5/10.   Allergies No Known Allergies  Level of Care/Admitting Diagnosis ED Disposition    None      B Medical/Surgery History Past Medical History:  Diagnosis Date  . Exertional shortness of breath    "before my last OR; I'm fine now" (09/26/2012)  . GERD (gastroesophageal reflux disease)   . High cholesterol   . Hx of seasonal allergies   . Hypertension   . Myocardial infarction ([place])   . Stroke [facility]) 2009   memory loss  . Type II diabetes mellitus ([place])    Past Surgical History:  Procedure Laterality Date  . AMPUTATION Right 07/29/2012   Procedure: AMPUTATION 3RD TOE;  Surgeon: Kerin Salen, MD;  Location: [place];  Service: Orthopedics;  Laterality: Right;  right third toe amputation  . AMPUTATION Right 08/02/2012   Procedure: right transmetatarsal amputation;  Surgeon: Kerin Salen, MD;  Location: [place];  Service: Orthopedics;  Laterality: Right;  . AMPUTATION Right 09/06/2012   Procedure: AMPUTATION BELOW KNEE;  Surgeon: Kerin Salen, MD;  Location: [place];  Service: Orthopedics;  Laterality: Right;  . AMPUTATION Left 09/27/2012   Procedure: AMPUTATION BELOW KNEE;  Surgeon: Kerin Salen, MD;  Location: [place];  Service: Orthopedics;  Laterality: Left;  . CATARACT EXTRACTION W/ INTRAOCULAR LENS   IMPLANT, BILATERAL  ?2010  . 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 ESOPHAGOGASTRODUODENOSCOPY N/A 10/08/2012   Procedure: ESOPHAGOGASTRODUODENOSCOPY (EGD);  Surgeon: Winfield Cunas., MD;  Location[facility] ENDOSCOPY;  Service: Endoscopy;  Laterality: N/A;  . PERIPHERALLY INSERTED CENTRAL CATHETER INSERTION Right 09/2012   upper arm     A IV Location/Drains/Wounds Patient Lines/Drains/Airways Status   Active Line/Drains/Airways    Name:   Placement date:   Placement time:   Site:   Days:   Peripheral IV 08/18/18 Left Antecubital   08/18/18    1625    Antecubital   less than 1   External Urinary Catheter   10/18/12    1058    -   2130   Incision 09/06/12 Leg Right   09/06/12    1224     2172   Incision 09/27/12 Leg Left   09/27/12    1529     2151   Wound 10/18/12 Abrasion(s) Leg Right   10/18/12    0914    Leg   2130          Intake/Output Last 24 hours  Intake/Output Summary (Last 24 hours) at 08/18/2018 2104 Last data filed at 08/18/2018 2047 Gross per 24 hour  Intake 1100 ml  Output -  Net 1100 ml    Labs/Imaging Results for orders placed or performed during the hospital encounter of 08/18/18 (  from the past 48 hour(s))  CBG monitoring, ED     Status: Abnormal   Collection Time: 08/18/18  4:23 PM  Result Value Ref Range   Glucose-Capillary 120 (H) 70 - 99 mg/dL   Comment 1 Notify RN    Comment 2 Document in Chart   Troponin I - ONCE - STAT     Status: Abnormal   Collection Time: 08/18/18  4:30 PM  Result Value Ref Range   Troponin I 0.16 (HH) <0.03 ng/mL    Comment: CRITICAL RESULT CALLED TO, READ BACK BY AND VERIFIED WITH: K COBB,RN 1751 08/18/2018 D BRADLEY Performed at [facility], [place] [address], [place], [place] [address]   Brain natriuretic peptide     Status: Abnormal   Collection Time: 08/18/18  4:30 PM  Result Value Ref Range   B Natriuretic Peptide 902.3 (H) 0.0 - 100.0 pg/mL    Comment: Performed at [facility] [address],  [place], [place] [address]  Basic metabolic panel     Status: Abnormal   Collection Time: 08/18/18  4:30 PM  Result Value Ref Range   Sodium 142 135 - 145 mmol/L   Potassium 3.2 (L) 3.5 - 5.1 mmol/L   Chloride 112 (H) 98 - 111 mmol/L   CO2 19 (L) 22 - 32 mmol/L   Glucose, Bld 130 (H) 70 - 99 mg/dL   BUN 23 8 - 23 mg/dL   Creatinine, Ser 2.68 (H) 0.61 - 1.24 mg/dL   Calcium 8.4 (L) 8.9 - 10.3 mg/dL   GFR calc non Af Amer 23 (L) >60 mL/min   GFR calc Af Amer 27 (L) >60 mL/min   Anion gap 11 5 - 15    Comment: Performed at [facility] [address] [place], [place] [address]  Magnesium     Status: None   Collection Time: 08/18/18  4:30 PM  Result Value Ref Range   Magnesium 1.7 1.7 - 2.4 mg/dL    Comment: Performed at [facility] [address] [place], [place] [address]  Hepatic function panel     Status: Abnormal   Collection Time: 08/18/18  4:30 PM  Result Value Ref Range   Total Protein 6.1 (L) 6.5 - 8.1 g/dL   Albumin 3.2 (L) 3.5 - 5.0 g/dL   AST 20 15 - 41 U/L   ALT 12 0 - 44 U/L   Alkaline Phosphatase 62 38 - 126 U/L   Total Bilirubin 0.8 0.3 - 1.2 mg/dL   Bilirubin, Direct 0.2 0.0 - 0.2 mg/dL   Indirect Bilirubin 0.6 0.3 - 0.9 mg/dL    Comment: Performed at [facility] [address] [place], [place] [address]  Urinalysis, Routine w reflex microscopic     Status: Abnormal   Collection Time: 08/18/18  4:32 PM  Result Value Ref Range   Color, Urine YELLOW YELLOW   APPearance HAZY (A) CLEAR   Specific Gravity, Urine 1.011 1.005 - 1.030   pH 5.0 5.0 - 8.0   Glucose, UA NEGATIVE NEGATIVE mg/dL   Hgb urine dipstick MODERATE (A) NEGATIVE   Bilirubin Urine NEGATIVE NEGATIVE   Ketones, ur 5 (A) NEGATIVE mg/dL   Protein, ur >=300 (A) NEGATIVE mg/dL   Nitrite NEGATIVE NEGATIVE   Leukocytes,Ua MODERATE (A) NEGATIVE   RBC / HPF 6-10 0 - 5 RBC/hpf   WBC, UA 21-50 0 - 5 WBC/hpf   Bacteria, UA MANY (A) NONE SEEN  Squamous Epithelial / LPF 0-5 0 - 5   WBC  Clumps PRESENT     Comment: Performed at [facility], [place] [address] [place], [place] [address]  Rapid urine drug screen (hospital performed)     Status: None   Collection Time: 08/18/18  4:32 PM  Result Value Ref Range   Opiates NONE DETECTED NONE DETECTED   Cocaine NONE DETECTED NONE DETECTED   Benzodiazepines NONE DETECTED NONE DETECTED   Amphetamines NONE DETECTED NONE DETECTED   Tetrahydrocannabinol NONE DETECTED NONE DETECTED   Barbiturates NONE DETECTED NONE DETECTED    Comment: (NOTE) DRUG SCREEN FOR MEDICAL PURPOSES ONLY.  IF CONFIRMATION IS NEEDED FOR ANY PURPOSE, NOTIFY LAB WITHIN 5 DAYS. LOWEST DETECTABLE LIMITS FOR URINE DRUG SCREEN Drug Class                     Cutoff (ng/mL) Amphetamine and metabolites    1000 Barbiturate and metabolites    200 Benzodiazepine                 629 Tricyclics and metabolites     300 Opiates and metabolites        300 Cocaine and metabolites        300 THC                            50 Performed at [facility], [place] [address] [place], [place] [address]   Lactic acid, plasma     Status: Abnormal   Collection Time: 08/18/18  4:33 PM  Result Value Ref Range   Lactic Acid, Venous 2.0 (HH) 0.5 - 1.9 mmol/L    Comment: CRITICAL RESULT CALLED TO, READ BACK BY AND VERIFIED WITH: L MEEKS,RN 1735 08/18/2018 D BRADLEY Performed at [facility], [place] [address], [place],  [address]   SARS Coronavirus 2 (CEPHEID- Performed in [facility] hospital lab), Hosp Order     Status: None   Collection Time: 08/18/18  5:04 PM   Specimen: Urine, Clean Catch; Nasopharyngeal  Result Value Ref Range   SARS Coronavirus 2 NEGATIVE NEGATIVE    Comment: (NOTE) If result is NEGATIVE SARS-CoV-2 target nucleic acids are NOT DETECTED. The SARS-CoV-2 RNA is generally detectable in upper and lower  respiratory specimens during the acute phase of infection. The lowest  concentration of SARS-CoV-2 viral copies this assay can detect is  250  copies / mL. A negative result does not preclude SARS-CoV-2 infection  and should not be used as the sole basis for treatment or other  patient management decisions.  A negative result may occur with  improper specimen collection / handling, submission of specimen other  than nasopharyngeal swab, presence of viral mutation(s) within the  areas targeted by this assay, and inadequate number of viral copies  (<250 copies / mL). A negative result must be combined with clinical  observations, patient history, and epidemiological information. If result is POSITIVE SARS-CoV-2 target nucleic acids are DETECTED. The SARS-CoV-2 RNA is generally detectable in upper and lower  respiratory specimens dur ing the acute phase of infection.  Positive  results are indicative of active infection with SARS-CoV-2.  Clinical  correlation with patient history and other diagnostic information is  necessary to determine patient infection status.  Positive results do  not rule out bacterial infection or co-infection with other viruses. If result is PRESUMPTIVE POSTIVE SARS-CoV-2 nucleic acids MAY BE PRESENT.  A presumptive positive result was obtained on the submitted specimen  and confirmed on repeat testing.  While 2019 novel coronavirus  (SARS-CoV-2) nucleic acids may be present in the submitted sample  additional confirmatory testing may be necessary for epidemiological  and / or clinical management purposes  to differentiate between  SARS-CoV-2 and other Sarbecovirus currently known to infect humans.  If clinically indicated additional testing with an alternate test  methodology [phone]) is advised. The SARS-CoV-2 RNA is generally  detectable in upper and lower respiratory sp ecimens during the acute  phase of infection. The expected result is Negative. Fact Sheet for Patients:  StrictlyIdeas.no Fact Sheet for Healthcare  Providers: BankingDealers.co.za This test is not yet approved or cleared by the [place] FDA and has been authorized for detection and/or diagnosis of SARS-CoV-2 by FDA under an Emergency Use Authorization (EUA).  This EUA will remain in effect (meaning this test can be used) for the duration of the COVID-19 declaration under Section 564(b)(1) of the Act, 21 U.S.C. section 360bbb-3(b)(1), unless the authorization is terminated or revoked sooner. Performed at [facility], [place] [address] [place], [place] [address]   Lactic acid, plasma     Status: None   Collection Time: 08/18/18  6:33 PM  Result Value Ref Range   Lactic Acid, Venous 1.5 0.5 - 1.9 mmol/L    Comment: Performed at [facility] [address] [place], [place] [address]   Dg Chest Portable 1 View  Result Date: 08/18/2018 CLINICAL DATA:  Chest pain today with fever. EXAM: PORTABLE CHEST 1 VIEW COMPARISON:  10/10/2012 FINDINGS: Stable changes from prior CABG surgery. The cardiac silhouette is borderline enlarged. No mediastinal or hilar masses or convincing adenopathy. Clear lungs.  No pleural effusion or pneumothorax. Old, healed left rib fracture.  No acute skeletal abnormality. IMPRESSION: No acute cardiopulmonary disease. Electronically Signed   By: Lajean Manes M.D.   On: 08/18/2018 18:25    Pending Labs Unresulted Labs (From admission, onward)    Start     Ordered   08/18/18 2103  Troponin I - Now Then Q6H  Now then every 6 hours,   R (with STAT occurrences)     08/18/18 2102   08/18/18 1632  Blood culture (routine x 2)  BLOOD CULTURE X 2,   STAT     08/18/18 1632   08/18/18 1632  Urine culture  ONCE - STAT,   STAT     08/18/18 1632          Vitals/Pain Today's Vitals   08/18/18 1915 08/18/18 1930 08/18/18 2030 08/18/18 2050  BP: (!) 141/70 (!) 141/75 134/78   Pulse: (!) 108 (!) 106 (!) 108   Resp: (!) 21 (!) 22    Temp:      TempSrc:      SpO2: 98% 95% 97%    Weight:      Height:      PainSc:    0-No pain    Isolation Precautions Droplet and Contact precautions  Medications Medications  vancomycin (VANCOCIN) 1,500 mg in sodium chloride 0.9 % 500 mL IVPB (1,500 mg Intravenous New Bag/Given 08/18/18 2049)  cefTRIAXone (ROCEPHIN) 1 g in sodium chloride 0.9 % 100 mL IVPB (has no administration in time range)  potassium chloride (K-DUR) CR tablet 30 mEq (has no administration in time range)  aspirin EC tablet 81 mg (has no administration in time range)  atorvastatin (LIPITOR) tablet 10 mg (has no administration in time range)  metoprolol succinate (TOPROL-XL) 24 hr tablet 50 mg (has no administration in time range)  multivitamin with minerals tablet 1 tablet (has no administration in time range)  nitroGLYCERIN (NITROSTAT) SL tablet 0.4 mg (has no administration in time range)  tamsulosin (FLOMAX) capsule 0.4 mg (has no administration in time range)  insulin aspart (novoLOG) injection 0-9 Units (has no administration in time range)  lactated ringers bolus 1,000 mL (0 mLs Intravenous Stopped 08/18/18 1846)  acetaminophen (TYLENOL) tablet 1,000 mg (1,000 mg Oral Given 08/18/18 1658)  ceFEPIme (MAXIPIME) 2 g in sodium chloride 0.9 % 100 mL IVPB (0 g Intravenous Stopped 08/18/18 2047)    Mobility walks with device Low fall risk   Focused Assessments    R Recommendations: See Admitting Provider Note  Report given to:   Additional Notes:

## 2018-08-18 NOTE — ED Provider Notes (Signed)
[place] [facility] EMERGENCY DEPARTMENT Provider Note   CSN: [id number] Arrival date & time: 08/18/18  1612    History   Chief Complaint Chief Complaint  Patient presents with  . Chest Pain    HPI [name] is a 68 y.o. male.     The history is provided by the patient and medical records.  Fever Temp source:  Subjective Severity:  Moderate Onset quality:  Sudden Duration:  1 day Timing:  Constant Progression:  Unchanged Chronicity:  New Relieved by:  Nothing Worsened by:  Nothing Ineffective treatments:  None tried Associated symptoms: chest pain, chills, congestion, myalgias, nausea and vomiting   Associated symptoms: no cough, no diarrhea and no sore throat   Risk factors: sick contacts   Risk factors: no recent sickness     Past Medical History:  Diagnosis Date  . Exertional shortness of breath    "before my last OR; I'm fine now" (09/26/2012)  . GERD (gastroesophageal reflux disease)   . High cholesterol   . Hx of seasonal allergies   . Hypertension   . Myocardial infarction ([place])   . Stroke [facility]) 2009   memory loss  . Type II diabetes mellitus [facility])     Patient Active Problem List   Diagnosis Date Noted  . NSTEMI (non-ST elevated myocardial infarction) ([place]) 08/18/2018  . Renal insufficiency 08/18/2018  . Chronic systolic heart failure ([place]) 10/24/2012  . Diabetic gastroparesis associated with type 2 diabetes mellitus ([place]) 10/24/2012  . Major depressive disorder, recurrent episode, moderate ([place]) 10/05/2012  . Fever 09/26/2012  . Wound eschar of foot 09/26/2012  . Poor appetite 09/21/2012  . Nausea with vomiting 09/21/2012  . Diabetes mellitus type 2, uncontrolled, with complications ([place]) 02/58/5277  . Type II or unspecified type diabetes mellitus  09/06/2012  . Acute kidney injury ([place]) 09/06/2012  . Foot ulcer, right ([place]) 09/06/2012  . DM (diabetes mellitus) type 2, uncontrolled, with ketoacidosis ([place]) 07/30/2012  . DKA  (diabetic ketoacidoses) ([place]) 07/27/2012  . Sepsis secondary to UTI ([place]) 07/27/2012  . Gangrene of foot Left 07/27/2012  . 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 H/O: CVA (cerebrovascular accident) 07/27/2012  . Tobacco abuse 07/27/2012  . Alcohol abuse 07/27/2012  . Hypertension 07/27/2012  . Anemia 07/27/2012    Past Surgical History:  Procedure Laterality Date  . AMPUTATION Right 07/29/2012   Procedure: AMPUTATION 3RD TOE;  Surgeon: Kerin Salen, MD;  Location: [place];  Service: Orthopedics;  Laterality: Right;  right third toe amputation  . AMPUTATION Right 08/02/2012   Procedure: right transmetatarsal amputation;  Surgeon: Kerin Salen, MD;  Location: [place];  Service: Orthopedics;  Laterality: Right;  . AMPUTATION Right 09/06/2012   Procedure: AMPUTATION BELOW KNEE;  Surgeon: Kerin Salen, MD;  Location: [place];  Service: Orthopedics;  Laterality: Right;  . AMPUTATION Left 09/27/2012   Procedure: AMPUTATION BELOW KNEE;  Surgeon: Kerin Salen, MD;  Location: [place];  Service: Orthopedics;  Laterality: Left;  . CATARACT EXTRACTION W/ INTRAOCULAR LENS  IMPLANT, BILATERAL  ?2010  . 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 ESOPHAGOGASTRODUODENOSCOPY N/A 10/08/2012   Procedure: ESOPHAGOGASTRODUODENOSCOPY (EGD);  Surgeon: Winfield Cunas., MD;  Location: [facility] ENDOSCOPY;  Service: Endoscopy;  Laterality: N/A;  . PERIPHERALLY INSERTED CENTRAL CATHETER INSERTION Right 09/2012   upper arm        Home Medications    Prior to Admission medications   Medication Sig Start Date  End Date Taking? Authorizing Provider  aspirin EC 81 MG tablet Take 81 mg by mouth every morning.   Yes [provider]  atorvastatin (LIPITOR) 10 MG tablet Take 10 mg by mouth every morning. 05/31/18  Yes [provider]  Cyanocobalamin (VITAMIN B-12 PO) Take 1 tablet by mouth every morning.   Yes [provider]  lisinopril (ZESTRIL) 20 MG tablet Take 20 mg by mouth every morning. 05/31/18  Yes  [provider]  metoprolol succinate (TOPROL-XL) 50 MG 24 hr tablet Take 50 mg by mouth every morning. 05/31/18  Yes [provider]  Multiple Vitamin (MULTIVITAMIN WITH MINERALS) TABS tablet Take 1 tablet by mouth every morning.   Yes [provider]  nitroGLYCERIN (NITROSTAT) 0.4 MG SL tablet Place 0.4 mg under the tongue every 5 (five) minutes as needed for chest pain.   Yes [provider]  tamsulosin (FLOMAX) 0.4 MG CAPS capsule Take 0.4 mg by mouth every morning. 06/11/18  Yes [provider]    Family History No family history on file.  Social History Social History   Tobacco Use  . Smoking status: Former Smoker    Packs/day: 0.12    Years: 20.00    Pack years: 2.40    Types: Cigarettes  . Smokeless tobacco: Never Used  Substance Use Topics  . Alcohol use: No    Comment: last drink 2010  . Drug use: No     Allergies   Patient has no known allergies.   Review of Systems Review of Systems  Constitutional: Positive for chills and fever.  HENT: Positive for congestion. Negative for sore throat.   Respiratory: Negative for cough.   Cardiovascular: Positive for chest pain.  Gastrointestinal: Positive for nausea and vomiting. Negative for diarrhea.  Musculoskeletal: Positive for myalgias.  All other systems reviewed and are negative.    Physical Exam Updated Vital Signs BP 134/78   Pulse (!) 108   Temp 100.3 F (37.9 C) (Oral)   Resp (!) 22   Ht 5' 9.5" (1.765 m)   Wt 81.6 kg   SpO2 97%   BMI 26.19 kg/m   Physical Exam Vitals signs and nursing note reviewed.  Constitutional:      Appearance: He is well-developed. He is ill-appearing. He is not diaphoretic.  HENT:     Head: Normocephalic and atraumatic.     Comments: Uvula midline No obvious asymmetric palatine elevation No obvious swellings posterior oropharynx No obvious swellings appreciated on neck exam Patient able to move neck in all directions without  difficulty, tolerating secretions appropriately  Eyes:     Conjunctiva/sclera: Conjunctivae normal.  Neck:     Musculoskeletal: Neck supple.     Vascular: No JVD.  Cardiovascular:     Rate and Rhythm: Regular rhythm. Tachycardia present.     Pulses:          Radial pulses are 2+ on the right side and 2+ on the left side.  Pulmonary:     Effort: Pulmonary effort is normal. No accessory muscle usage.     Breath sounds: No stridor. No wheezing or rhonchi.  Abdominal:     Palpations: Abdomen is soft.     Tenderness: There is no abdominal tenderness.  Musculoskeletal:     Comments: Bilateral BKA's  Skin:    General: Skin is warm and dry.  Neurological:     Mental Status: He is alert and oriented to person, place, and time.      ED Treatments /  Results  Labs (all labs ordered are listed, but only abnormal results are displayed) Labs Reviewed  TROPONIN I - Abnormal; Notable for the following components:      Result Value   Troponin I 0.16 (*)    All other components within normal limits  BRAIN NATRIURETIC PEPTIDE - Abnormal; Notable for the following components:   B Natriuretic Peptide 902.3 (*)    All other components within normal limits  BASIC METABOLIC PANEL - Abnormal; Notable for the following components:   Potassium 3.2 (*)    Chloride 112 (*)    CO2 19 (*)    Glucose, Bld 130 (*)    Creatinine, Ser 2.68 (*)    Calcium 8.4 (*)    GFR calc non Af Amer 23 (*)    GFR calc Af Amer 27 (*)    All other components within normal limits  HEPATIC FUNCTION PANEL - Abnormal; Notable for the following components:   Total Protein 6.1 (*)    Albumin 3.2 (*)    All other components within normal limits  URINALYSIS, ROUTINE W REFLEX MICROSCOPIC - Abnormal; Notable for the following components:   APPearance HAZY (*)    Hgb urine dipstick MODERATE (*)    Ketones, ur 5 (*)    Protein, ur >=300 (*)    Leukocytes,Ua MODERATE (*)    Bacteria, UA MANY (*)    All other components  within normal limits  LACTIC ACID, PLASMA - Abnormal; Notable for the following components:   Lactic Acid, Venous 2.0 (*)    All other components within normal limits  TROPONIN I - Abnormal; Notable for the following components:   Troponin I 4.47 (*)    All other components within normal limits  CBC - Abnormal; Notable for the following components:   WBC 18.6 (*)    RBC 2.92 (*)    Hemoglobin 8.5 (*)    HCT 27.5 (*)    All other components within normal limits  CBG MONITORING, ED - Abnormal; Notable for the following components:   Glucose-Capillary 120 (*)    All other components within normal limits  SARS CORONAVIRUS 2 (HOSPITAL ORDER, [facility])  CULTURE, BLOOD (ROUTINE X 2)  CULTURE, BLOOD (ROUTINE X 2)  URINE CULTURE  MAGNESIUM  RAPID URINE DRUG SCREEN, HOSP PERFORMED  LACTIC ACID, PLASMA  TROPONIN I  TROPONIN I  HIV ANTIBODY (ROUTINE TESTING W REFLEX)  CBC  BASIC METABOLIC PANEL  HEPARIN LEVEL (UNFRACTIONATED)    EKG EKG Interpretation  Date/Time:  Friday August 18 2018 16:19:28 EDT Ventricular Rate:  115 PR Interval:    QRS Duration: 151 QT Interval:  356 QTC Calculation: 493 R Axis:   57 Text Interpretation:  Sinus tachycardia Right bundle branch block Probable inferior infarct, recent diffuse ST depression concerning for ischemia Confirmed by Theotis Burrow [phone]) on 08/18/2018 4:25:31 PM   Radiology Dg Chest Portable 1 View  Result Date: 08/18/2018 CLINICAL DATA:  Chest pain today with fever. EXAM: PORTABLE CHEST 1 VIEW COMPARISON:  10/10/2012 FINDINGS: Stable changes from prior CABG surgery. The cardiac silhouette is borderline enlarged. No mediastinal or hilar masses or convincing adenopathy. Clear lungs.  No pleural effusion or pneumothorax. Old, healed left rib fracture.  No acute skeletal abnormality. IMPRESSION: No acute cardiopulmonary disease. Electronically Signed   By: Lajean Manes M.D.   On: 08/18/2018 18:25     Procedures Procedures (including critical care time)  Medications Ordered in ED Medications  cefTRIAXone (ROCEPHIN) 1  g in sodium chloride 0.9 % 100 mL IVPB (has no administration in time range)  potassium chloride (K-DUR) CR tablet 30 mEq (has no administration in time range)  nitroGLYCERIN (NITROSTAT) SL tablet 0.4 mg (has no administration in time range)  tamsulosin (FLOMAX) capsule 0.4 mg (0.4 mg Oral Given 08/18/18 2226)  acetaminophen (TYLENOL) tablet 650 mg (has no administration in time range)    Or  acetaminophen (TYLENOL) suppository 650 mg (has no administration in time range)  ondansetron (ZOFRAN) tablet 4 mg (has no administration in time range)    Or  ondansetron (ZOFRAN) injection 4 mg (has no administration in time range)  metoprolol succinate (TOPROL-XL) 24 hr tablet 50 mg (has no administration in time range)  multivitamin with minerals tablet 1 tablet (has no administration in time range)  aspirin EC tablet 81 mg (has no administration in time range)  atorvastatin (LIPITOR) tablet 10 mg (has no administration in time range)  insulin aspart (novoLOG) injection 0-9 Units (has no administration in time range)  heparin ADULT infusion 100 units/mL (25000 units/215mL sodium chloride 0.45%) (1,150 Units/hr Intravenous New Bag/Given 08/18/18 2307)  lactated ringers bolus 1,000 mL (0 mLs Intravenous Stopped 08/18/18 1846)  acetaminophen (TYLENOL) tablet 1,000 mg (1,000 mg Oral Given 08/18/18 1658)  vancomycin (VANCOCIN) 1,500 mg in sodium chloride 0.9 % 500 mL IVPB (1,500 mg Intravenous New Bag/Given 08/18/18 2049)  ceFEPIme (MAXIPIME) 2 g in sodium chloride 0.9 % 100 mL IVPB (0 g Intravenous Stopped 08/18/18 2047)  heparin bolus via infusion 4,000 Units (4,000 Units Intravenous Bolus from Bag 08/18/18 2311)     Initial Impression / Assessment and Plan / ED Course  I have reviewed the triage vital signs and the nursing notes.  Pertinent labs & imaging results that were available  during my care of the patient were reviewed by me and considered in my medical decision making (see chart for details).        Medical Decision Making: [name] is a 68 y.o. male who presented to the ED today with fever, nausea, vomiting, chest pain.  Past medical history significant for hypertension, diabetes, MDD, heart failure, gastroparesis, hyperlipidemia, multivessel CABG, bilateral BKA's Reviewed and confirmed nursing documentation for past medical history, family history, social history.  On my initial exam, the pt was ill-appearing, tachycardic, not hypotensive, febrile to 102 degrees, no significant increased work of breathing or respiratory distress, no signs of impending airway failure.   Initial EKG shows sinus tachycardia with a right bundle branch block, QRS 151, QTc 493, when compared with prior EKG from 10/21/2012 significantly changed, no bundle branch block or ST segment changes on prior Discussed EKG with cardiology, per patient's history, patient developed subjective fever with nausea and vomiting, developed chest pain after the fifth episode of vomiting, is not having active chest pain in emergency department when he is not vomiting, cardiologist concern for potential atrial flutter, does not believe patient is having an ST elevation MI,  will continue to monitor patient, patient received aspirin and nitro x2 with EMS EKG as needed chest pain or any concerning changes  We will primarily work patient up for infectious cause of symptoms, likely etiology respiratory versus GI, patient denies any GU symptoms, no skin changes or rashes, no signs of meningismus.  Patient feels fatigued but grossly does not have any pain in the chest abdomen GU region or back or neck Patient given IV fluid bolus, blood cultures x2, antipyretic, blood work, urine studies, chest x-ray. IV antibiotics  administered Urinalysis shows evidence of infection White blood cells 18.6, hemoglobin 8.5  Troponin elevated, BNP elevated Worsening creatinine. COVID testing negative. All radiology and laboratory studies reviewed independently and with my attending physician, agree with reading provided by radiologist unless otherwise noted.  Upon reassessing patient, patient was calm, resting comfortably, feels improved since presentation Based on the above findings, I believe patient requires admission. Pt admitted.  The above care was discussed with and agreed upon by my attending physician. Emergency Department Medication Summary:  Medications  cefTRIAXone (ROCEPHIN) 1 g in sodium chloride 0.9 % 100 mL IVPB (has no administration in time range)  potassium chloride (K-DUR) CR tablet 30 mEq (has no administration in time range)  nitroGLYCERIN (NITROSTAT) SL tablet 0.4 mg (has no administration in time range)  tamsulosin (FLOMAX) capsule 0.4 mg (0.4 mg Oral Given 08/18/18 2226)  acetaminophen (TYLENOL) tablet 650 mg (has no administration in time range)    Or  acetaminophen (TYLENOL) suppository 650 mg (has no administration in time range)  ondansetron (ZOFRAN) tablet 4 mg (has no administration in time range)    Or  ondansetron (ZOFRAN) injection 4 mg (has no administration in time range)  metoprolol succinate (TOPROL-XL) 24 hr tablet 50 mg (has no administration in time range)  multivitamin with minerals tablet 1 tablet (has no administration in time range)  aspirin EC tablet 81 mg (has no administration in time range)  atorvastatin (LIPITOR) tablet 10 mg (has no administration in time range)  insulin aspart (novoLOG) injection 0-9 Units (has no administration in time range)  heparin ADULT infusion 100 units/mL (25000 units/212mL sodium chloride 0.45%) (1,150 Units/hr Intravenous New Bag/Given 08/18/18 2307)  lactated ringers bolus 1,000 mL (0 mLs Intravenous Stopped 08/18/18 1846)  acetaminophen (TYLENOL) tablet 1,000 mg (1,000 mg Oral Given 08/18/18 1658)  vancomycin (VANCOCIN) 1,500 mg in  sodium chloride 0.9 % 500 mL IVPB (1,500 mg Intravenous New Bag/Given 08/18/18 2049)  ceFEPIme (MAXIPIME) 2 g in sodium chloride 0.9 % 100 mL IVPB (0 g Intravenous Stopped 08/18/18 2047)  heparin bolus via infusion 4,000 Units (4,000 Units Intravenous Bolus from Bag 08/18/18 2311)    Final Clinical Impressions(s) / ED Diagnoses   Final diagnoses:  Fever and chills    ED Discharge Orders    None       Lonzo Candy, MD 08/18/18 [place], Wenda Over[place], MD 08/20/18 2202

## 2018-08-18 NOTE — Progress Notes (Addendum)
Second trop 4.47:  Though with creat of 2.68, CP free at the moment, and fever / UTI, not to sure they will be going for LHC immediately in the AM.

## 2018-08-18 NOTE — ED Triage Notes (Signed)
Brought by ems from home for c/o chest pain that started around noon.  Reports eating a biscuit this morning around 10.  Started having digestive upset around 11 with vomiting then chest pain started around 12.  Hx of quad bypass.  CBG-142.  Given Asa 324mg  and 2 SL NTG.  Reports pain is gradually going away.  Now rating 4 to 5/10.

## 2018-08-18 NOTE — H&P (Signed)
History and Physical    [name] LPF:[id number] DOB: March 16, 1950 DOA: 08/18/2018  PCP: Boneta Lucks, MD  Patient coming from: Home  I have personally briefly reviewed patient's old medical records in [place]  Chief Complaint: Chest pain  HPI: [name] is a 68 y.o. 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  Patient presents to the ED at [facility] with c/o CP, fever, N/V.  Patient ate breakfast this morning around 10, started having N/V around 11.  After several episodes of vomiting he began to have CP around noon.   ED Course: Given ASA 324, and 2 SL NTG.  CP began to improve and is now resolved at this time.  Trop 0.16.  Tm 102.1.  HR 114 initially, now down to ~108.  Patient put on cefepime and vanc empirically for sepsis.  BNP 902, Lactate 2.0 and 1.5 on repeat.  EKG looked concerning with diffuse ST segment depressions and possible STE in lead III.  Cardiology was called by EDP and reviewed EKG.  Dr. Irish Lack reportedly thought that EKG represented A.Flutter and that patient was not having a STEMI.  Creat 2.68, previous values were 1.5 range but this was back in 2014.  BUN 23.  UA suspicious for UTI with mod LE, mod HGB, 21-50 WBC, and many bacteria.  CXR clear, COVID negative.  CP resolved at this time, patients biggest complaint is that he is hungry right now he tells me.   Review of Systems: As per HPI otherwise 10 point review of systems negative.   Past Medical History:  Diagnosis Date  . Exertional shortness of breath    "before my last OR; I'm fine now" (09/26/2012)  . GERD (gastroesophageal reflux disease)   . High cholesterol   . Hx of seasonal allergies   . Hypertension   . Myocardial infarction ([place])   . Stroke [facility]) 2009   memory loss  . Type II diabetes mellitus ([place])     Past Surgical History:  Procedure Laterality Date  . AMPUTATION Right 07/29/2012   Procedure: AMPUTATION 3RD TOE;  Surgeon: Kerin Salen, MD;  Location: [place];  Service: Orthopedics;  Laterality: Right;  right third toe amputation  . AMPUTATION Right 08/02/2012   Procedure: right transmetatarsal amputation;  Surgeon: Kerin Salen, MD;  Location: [place];  Service: Orthopedics;  Laterality: Right;  . AMPUTATION Right 09/06/2012   Procedure: AMPUTATION BELOW KNEE;  Surgeon: Kerin Salen, MD;  Location: [place];  Service: Orthopedics;  Laterality: Right;  . AMPUTATION Left 09/27/2012   Procedure: AMPUTATION BELOW KNEE;  Surgeon: Kerin Salen, MD;  Location: [place];  Service: Orthopedics;  Laterality: Left;  . CATARACT EXTRACTION W/ INTRAOCULAR LENS  IMPLANT, BILATERAL  ?2010  . 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 ESOPHAGOGASTRODUODENOSCOPY N/A 10/08/2012   Procedure: ESOPHAGOGASTRODUODENOSCOPY (EGD);  Surgeon: Winfield Cunas., MD;  Location: [facility] ENDOSCOPY;  Service: Endoscopy;  Laterality: N/A;  . PERIPHERALLY INSERTED CENTRAL CATHETER INSERTION Right 09/2012   upper arm     reports that he has quit smoking. His smoking use included cigarettes. He has a 2.40 pack-year smoking history. He has never used smokeless tobacco. He reports that he does not drink alcohol or use drugs.  No Known Allergies  No family history on file. Positive for DM and stroke.  Prior to Admission medications  Medication Sig Start Date End Date Taking? Authorizing Provider  aspirin EC 81 MG tablet Take 81 mg by mouth every morning.   Yes [provider]  atorvastatin (LIPITOR) 10 MG tablet Take 10 mg by mouth every morning. 05/31/18  Yes [provider]  Cyanocobalamin (VITAMIN B-12 PO) Take 1 tablet by mouth every morning.   Yes [provider]  lisinopril (ZESTRIL) 20 MG tablet Take 20 mg by mouth every morning. 05/31/18  Yes [provider]  metoprolol succinate (TOPROL-XL) 50 MG 24 hr tablet Take 50 mg by mouth every morning. 05/31/18  Yes [provider]  Multiple Vitamin (MULTIVITAMIN WITH MINERALS) TABS tablet Take 1 tablet by mouth every morning.   Yes [provider]  nitroGLYCERIN (NITROSTAT) 0.4 MG SL tablet Place 0.4 mg under the tongue every 5 (five) minutes as needed for chest pain.   Yes [provider]  tamsulosin (FLOMAX) 0.4 MG CAPS capsule Take 0.4 mg by mouth every morning. 06/11/18  Yes [provider]    Physical Exam: Vitals:   08/18/18 1845 08/18/18 1915 08/18/18 1930 08/18/18 2030  BP: 140/76 (!) 141/70 (!) 141/75 134/78  Pulse: (!) 110 (!) 108 (!) 106 (!) 108  Resp: (!) 21 (!) 21 (!) 22   Temp:      TempSrc:      SpO2: 99% 98% 95% 97%  Weight:      Height:        Constitutional: NAD, calm, comfortable Eyes: PERRL, lids and conjunctivae normal ENMT: Mucous membranes are moist. Posterior pharynx clear of any exudate or lesions.Normal dentition.  Neck: normal, supple, no masses, no thyromegaly Respiratory: clear to auscultation bilaterally, no wheezing, no crackles. Normal respiratory effort. No accessory muscle use.  Cardiovascular: Regular rate and rhythm, slightly tachycardic, no murmurs / rubs / gallops. No extremity edema. 2+ pedal pulses. No carotid bruits.  Abdomen: no tenderness, no masses palpated. No hepatosplenomegaly. Bowel sounds positive.  Musculoskeletal: S/P BLE amputations Skin: no rashes, lesions, ulcers. No induration Neurologic: CN 2-12 grossly intact. Sensation intact, DTR normal. Strength 5/5 in all 4.  Psychiatric: Normal judgment and insight. Alert and oriented x 3. Normal mood.    Labs on Admission: I have personally reviewed following labs and imaging studies  CBC: No results for input(s): WBC, NEUTROABS, HGB, HCT, MCV, PLT in the last 168 hours. Basic Metabolic Panel: Recent Labs  Lab 08/18/18 1630  NA 142  K 3.2*  CL 112*  CO2 19*  GLUCOSE 130*  BUN 23  CREATININE 2.68*  CALCIUM 8.4*  MG 1.7   GFR: Estimated Creatinine Clearance:  26.8 mL/min (A) (by C-G formula based on SCr of 2.68 mg/dL (H)). Liver Function Tests: Recent Labs  Lab 08/18/18 1630  AST 20  ALT 12  ALKPHOS 62  BILITOT 0.8  PROT 6.1*  ALBUMIN 3.2*   No results for input(s): LIPASE, AMYLASE in the last 168 hours. No results for input(s): AMMONIA in the last 168 hours. Coagulation Profile: No results for input(s): INR, PROTIME in the last 168 hours. Cardiac Enzymes: Recent Labs  Lab 08/18/18 1630  TROPONINI 0.16*   BNP (last 3 results) No results for input(s): PROBNP in the last 8760 hours. HbA1C: No results for input(s): HGBA1C in the last 72 hours. CBG: Recent Labs  Lab 08/18/18 1623  GLUCAP 120*   Lipid Profile: No results for input(s): CHOL, HDL, LDLCALC, TRIG, CHOLHDL, LDLDIRECT in the last 72 hours. Thyroid Function Tests: No results for input(s): TSH,  T4TOTAL, FREET4, T3FREE, THYROIDAB in the last 72 hours. Anemia Panel: No results for input(s): VITAMINB12, FOLATE, FERRITIN, TIBC, IRON, RETICCTPCT in the last 72 hours. Urine analysis:    Component Value Date/Time   COLORURINE YELLOW 08/18/2018 1632   APPEARANCEUR HAZY (A) 08/18/2018 1632   LABSPEC 1.011 08/18/2018 1632   PHURINE 5.0 08/18/2018 1632   GLUCOSEU NEGATIVE 08/18/2018 1632   HGBUR MODERATE (A) 08/18/2018 1632   BILIRUBINUR NEGATIVE 08/18/2018 1632   KETONESUR 5 (A) 08/18/2018 1632   PROTEINUR >=300 (A) 08/18/2018 1632   UROBILINOGEN 0.2 10/15/2012 1225   NITRITE NEGATIVE 08/18/2018 1632   LEUKOCYTESUR MODERATE (A) 08/18/2018 1632    Radiological Exams on Admission: Dg Chest Portable 1 View  Result Date: 08/18/2018 CLINICAL DATA:  Chest pain today with fever. EXAM: PORTABLE CHEST 1 VIEW COMPARISON:  10/10/2012 FINDINGS: Stable changes from prior CABG surgery. The cardiac silhouette is borderline enlarged. No mediastinal or hilar masses or convincing adenopathy. Clear lungs.  No pleural effusion or pneumothorax. Old, healed left rib fracture.  No acute  skeletal abnormality. IMPRESSION: No acute cardiopulmonary disease. Electronically Signed   By: Lajean Manes M.D.   On: 08/18/2018 18:25    EKG: Independently reviewed.  Assessment/Plan Principal Problem:   Sepsis secondary to UTI [facility]) Active Problems:   S/P CABG x 4   Diabetes mellitus type 2, uncontrolled, with complications (HCC)   Chronic systolic heart failure (HCC)   NSTEMI (non-ST elevated myocardial infarction) ([place])   Renal insufficiency    1. Sepsis secondary to UTI - 1. Rocephin 2. UCx, BCx pending 3. Tylenol PRN fever 4. BNP 902, Lactate 2.0 and 1.5 on repeat, BP 176H-607P systolic.  Will hold off on ordering IVF bolus for the moment. 2. NSTEMI - possibly type 2 MI 1. Cardiology called by EDP, Dr. Irish Lack didn't think that patient was having STEMI based on EKG 2. Though EKG is certainly abnormal and markedly changed since the last one (from 2014) we have on file 3. Will repeat EKG now that CP resolved 4. First trop positive, checking serial trops to trend 5. CP resolved post NTG at this point 6. Continue home Metoprolol, statin 7. Will order heparin gtt: for NSTEMI vs A.Flutter 8. Tele monitor 9. 2d echo given the EKG changes, pos A.Flutter, elevated trop, and CP 10. Will go ahead and let patient eat for now 3. Possible A.Flutter - 1. HR 108 2. Cardiology apparently thought EKG looked like A.Flutter per EDP 3. CHADS-VASc in that case would be > 2 4. Continue home metoprolol 5. Heparin gtt for this and NSTEMI 4. Renal insufficiency - 1. Unclear if just CKD stage 4 vs AKI on CKD.  Im most suspicious of the latter. 2. Hold lisinopril 3. Dont think hes pre-renal at the moment given XTG:GYIRS ratio 4. Repeat BMP in AM 5. Hypokalemia - replace 6. DM2 - 1. Apparently not on any home meds at this point any longer 2. Sensitive SSI AC 3. With CBG checks AC/HS and 0300  DVT prophylaxis: Heparin Gtt Code Status: Full Family Communication: No family in room  Disposition Plan: Home after admit Consults called: Cardiology called by EDP Admission status: Admit to inpatient  Severity of Illness: The appropriate patient status for this patient is INPATIENT. Inpatient status is judged to be reasonable and necessary in order to provide the required intensity of service to ensure the patient's safety. The patient's presenting symptoms, physical exam findings, and initial radiographic and laboratory data in the context of their chronic  comorbidities is felt to place them at high risk for further clinical deterioration. Furthermore, it is not anticipated that the patient will be medically stable for discharge from the hospital within 2 midnights of admission. The following factors support the patient status of inpatient.   IP status for NSTEMI with positive trop, chest pain, and EKG abnormalities.  Possibly type 2 MI secondary to sepsis given the fever and UTI findings.  * I certify that at the point of admission it is my clinical judgment that the patient will require inpatient hospital care spanning beyond 2 midnights from the point of admission due to high intensity of service, high risk for further deterioration and high frequency of surveillance required.*    ,  M. DO [facility]  How to contact the [facility] Attending or Consulting provider [place] or covering provider during after hours [place], for this patient?  1. Check the care team in [facility] and look for a) attending/consulting TRH provider listed and b) the [facility] team listed 2. Log into [email or web address]  Amion Physician Scheduling and messaging for groups and whole hospitals  On call and physician scheduling software for group practices, residents, hospitalists and other medical providers for call, clinic, rotation and shift schedules. OnCall Enterprise is a hospital-wide system for scheduling doctors and paging doctors on call. EasyPlot is for scientific plotting and data analysis.   [email or web address]  and use [place]'s universal password to access. If you do not have the password, please contact the hospital operator.  3. Locate the [facility] provider you are looking for under [facility] and page to a number that you can be directly reached. 4. If you still have difficulty reaching the provider, please page the [facility] (Director on Call) for the Hospitalists listed on amion for assistance.  08/18/2018, 9:36 PM

## 2018-08-19 ENCOUNTER — Other Ambulatory Visit: Payer: Self-pay

## 2018-08-19 ENCOUNTER — Inpatient Hospital Stay (HOSPITAL_COMMUNITY): Payer: Medicare HMO

## 2018-08-19 DIAGNOSIS — I214 Non-ST elevation (NSTEMI) myocardial infarction: Secondary | ICD-10-CM

## 2018-08-19 DIAGNOSIS — I361 Nonrheumatic tricuspid (valve) insufficiency: Secondary | ICD-10-CM

## 2018-08-19 DIAGNOSIS — N390 Urinary tract infection, site not specified: Secondary | ICD-10-CM

## 2018-08-19 DIAGNOSIS — Z951 Presence of aortocoronary bypass graft: Secondary | ICD-10-CM

## 2018-08-19 DIAGNOSIS — I5022 Chronic systolic (congestive) heart failure: Secondary | ICD-10-CM

## 2018-08-19 DIAGNOSIS — A419 Sepsis, unspecified organism: Secondary | ICD-10-CM

## 2018-08-19 DIAGNOSIS — I340 Nonrheumatic mitral (valve) insufficiency: Secondary | ICD-10-CM

## 2018-08-19 LAB — HIV ANTIBODY (ROUTINE TESTING W REFLEX): HIV Screen 4th Generation wRfx: NONREACTIVE

## 2018-08-19 LAB — BASIC METABOLIC PANEL
Anion gap: 13 (ref 5–15)
BUN: 29 mg/dL — ABNORMAL HIGH (ref 8–23)
CO2: 17 mmol/L — ABNORMAL LOW (ref 22–32)
Calcium: 8.4 mg/dL — ABNORMAL LOW (ref 8.9–10.3)
Chloride: 108 mmol/L (ref 98–111)
Creatinine, Ser: 2.81 mg/dL — ABNORMAL HIGH (ref 0.61–1.24)
GFR calc Af Amer: 26 mL/min — ABNORMAL LOW (ref >60)
GFR calc non Af Amer: 22 mL/min — ABNORMAL LOW (ref >60)
Glucose, Bld: 174 mg/dL — ABNORMAL HIGH (ref 70–99)
Potassium: 4.1 mmol/L (ref 3.5–5.1)
Sodium: 138 mmol/L (ref 135–145)

## 2018-08-19 LAB — HEPARIN LEVEL (UNFRACTIONATED)
Heparin Unfractionated: 0.67 IU/mL (ref 0.30–0.70)
Heparin Unfractionated: 0.85 IU/mL — ABNORMAL HIGH (ref 0.30–0.70)

## 2018-08-19 LAB — GLUCOSE, CAPILLARY
Glucose-Capillary: 147 mg/dL — ABNORMAL HIGH (ref 70–99)
Glucose-Capillary: 164 mg/dL — ABNORMAL HIGH (ref 70–99)
Glucose-Capillary: 154 mg/dL — ABNORMAL HIGH (ref 70–99)
Glucose-Capillary: 163 mg/dL — ABNORMAL HIGH (ref 70–99)
Glucose-Capillary: 201 mg/dL — ABNORMAL HIGH (ref 70–99)

## 2018-08-19 LAB — ECHOCARDIOGRAM COMPLETE
Height: 69.5 in
Weight: 2878.33 oz

## 2018-08-19 LAB — CBC
HCT: 29 % — ABNORMAL LOW (ref 39.0–52.0)
Hemoglobin: 9.1 g/dL — ABNORMAL LOW (ref 13.0–17.0)
MCH: 28.8 pg (ref 26.0–34.0)
MCHC: 31.4 g/dL (ref 30.0–36.0)
MCV: 91.8 fL (ref 80.0–100.0)
Platelets: 205 10*3/uL (ref 150–400)
RBC: 3.16 MIL/uL — ABNORMAL LOW (ref 4.22–5.81)
RDW: 13.2 % (ref 11.5–15.5)
WBC: 16.4 10*3/uL — ABNORMAL HIGH (ref 4.0–10.5)
nRBC: 0 % (ref 0.0–0.2)

## 2018-08-19 LAB — HEMOGLOBIN A1C
Hgb A1c MFr Bld: 5.6 % (ref 4.8–5.6)
Mean Plasma Glucose: 114.02 mg/dL

## 2018-08-19 LAB — TROPONIN I
Troponin I: 17.3 ng/mL (ref <0.03)
Troponin I: 31.83 ng/mL (ref <0.03)
Troponin I: 35.22 ng/mL (ref <0.03)

## 2018-08-19 MED ORDER — METOPROLOL TARTRATE 5 MG/5ML IV SOLN
2.5000 mg | Freq: Four times a day (QID) | INTRAVENOUS | Status: DC
  Administered 2018-08-19 – 2018-08-21 (×7): 2.5 mg via INTRAVENOUS
  Filled 2018-08-19 (×7): qty 5

## 2018-08-19 MED ORDER — PERFLUTREN LIPID MICROSPHERE
1.0000 mL | INTRAVENOUS | Status: AC | PRN
  Administered 2018-08-19: 3 mL via INTRAVENOUS
  Filled 2018-08-19: qty 10

## 2018-08-19 MED ORDER — SODIUM CHLORIDE 0.9 % IV SOLN
INTRAVENOUS | Status: DC
  Administered 2018-08-19 – 2018-08-23 (×6): via INTRAVENOUS

## 2018-08-19 MED ORDER — CAMPHOR-MENTHOL 0.5-0.5 % EX LOTN
TOPICAL_LOTION | CUTANEOUS | Status: DC | PRN
  Administered 2018-08-20: via TOPICAL
  Filled 2018-08-19: qty 222

## 2018-08-19 NOTE — Progress Notes (Signed)
Pt c/o nausea after having some breakfast.  IV zofran was not effective. Had emesis one hour later. Dr. Lonny Prude made aware.  Idolina Primer, RN

## 2018-08-19 NOTE — Progress Notes (Signed)
[place] for heparin Indication: chest pain/ACS, possible aflutter  Heparin Dosing Weight: 81.6 kg  Labs: Recent Labs    08/18/18 1630 08/18/18 2136 08/19/18 0304 08/19/18 0623 08/19/18 1142 08/19/18 1536  HGB  --  8.5* 9.1*  --   --   --   HCT  --  27.5* 29.0*  --   --   --   PLT  --  280 205  --   --   --   HEPARINUNFRC  --   --   --  0.67  --  0.85*  CREATININE 2.68*  --  2.81*  --   --   --   TROPONINI 0.16* 4.47* 17.30*  --  31.83*  --     Assessment: 65 yom presenting with CP, possible aflutter. Not on anticoagulation PTA. Noted bilateral BKA. Scr up to 2.81, troponin 17.3. Pharmacy consulted to dose heparin.   Heparin level remains elevated despite dose reduction, likely lingering bolus due to renal dysfunction. No S/Sx bleeding this shift per RN.  Goal of Therapy:  Heparin level 0.3-0.7 units/ml Monitor platelets by anticoagulation protocol: Yes   Plan:  Reduce heparin to 950 units/hr Recheck heparin level with morning labs   Arrie Senate, PharmD, BCPS Clinical Pharmacist [phone] Please check AMION for all [place] numbers 08/19/2018

## 2018-08-19 NOTE — Progress Notes (Signed)
[place] for heparin Indication: chest pain/ACS, possible aflutter  Heparin Dosing Weight: 81.6 kg  Labs: Recent Labs    08/18/18 1630 08/18/18 2136 08/19/18 0304 08/19/18 0623  HGB  --  8.5* 9.1*  --   HCT  --  27.5* 29.0*  --   PLT  --  280 205  --   HEPARINUNFRC  --   --   --  0.67  CREATININE 2.68*  --  2.81*  --   TROPONINI 0.16* 4.47* 17.30*  --     Assessment: 82 yom presenting with CP, possible aflutter. Not on anticoagulation PTA. Noted bilateral BKA. Scr up to 2.81, troponin 17.3. Pharmacy consulted to dose heparin.   Heparin level remains on the upper end of therapeutic at 0.67 this AM (goal 0.3 - 0.7) on heparin 1150 units/hr. Hgb improved from 8.5 to 9.1, and pltc stable. Per RN no issues with bleeding.  Goal of Therapy:  Heparin level 0.3-0.7 units/ml Monitor platelets by anticoagulation protocol: Yes   Plan:  Decrease heparin to 1100 units/hr 8h confirmatory heparin level Daily heparin level/CBC Monitor s/sx bleeding  Thank you for allowing pharmacy to be a part of this patient's care.  Leron Croak, PharmD PGY1 Pharmacy Resident Phone: [phone]  Please check AMION for all [place] phone numbers 08/19/2018 7:42 AM

## 2018-08-19 NOTE — Progress Notes (Signed)
Ref: Boneta Lucks, MD   Subjective:  Denies chest pain for now. He had chest pain yesterday with nausea and vomiting along with UTI and fever. Aware of inferior wall MI and need for cardiac cath at risk of renal function deterioration or bleed. Some nausea persist. Troponin I levels are elevated. Mild Fever and UTI treated with IV antibiotics. He has h/o bilateral BKA, CAD, S/P CABG, HTN, anemia of chronic disease, type 2 DM and CKD, IV.   Objective:  Vital Signs in the last 24 hours: Temp:  [98.7 F (37.1 C)-100.3 F (37.9 C)] 99.7 F (37.6 C) (06/13 0613) Pulse Rate:  [95-113] 95 (06/13 1319) Cardiac Rhythm: Sinus tachycardia;Bundle branch block (06/13 0700) Resp:  [20-37] 37 (06/13 0117) BP: (133-152)/(59-88) 152/87 (06/13 1319) SpO2:  [95 %-100 %] 96 % (06/13 1319) Weight:  [81.6 kg] 81.6 kg (06/13 0117)  Physical Exam: BP Readings from Last 1 Encounters:  08/19/18 (!) 152/87     Wt Readings from Last 1 Encounters:  08/19/18 81.6 kg    Weight change:  Body mass index is 26.19 kg/m. HEENT: [place]/AT, Eyes-Brown, Conjunctiva-Pale pink, Sclera-Non-icteric Neck: No JVD, No bruit, Trachea midline. Lungs:  Clear, Bilateral. Cardiac:  Regular rhythm, normal S1 and S2, no S3. II/VI systolic murmur. Abdomen:  Soft, non-tender. BS present. Extremities:  BKA, bilateral, No edema present. No cyanosis. CNS: AxOx3, Cranial nerves grossly intact, moves all 4 extremities with BKAs.  Skin: Warm and dry.   Intake/Output from previous day: 06/12 0701 - 06/13 0700 In: 1805 [P.O.:120; I.V.:85; IV Piggyback:1600] Out: -     Lab Results: BMET    Component Value Date/Time   NA 138 08/19/2018 0304   NA 142 08/18/2018 1630   NA 136 10/20/2012 0510   K 4.1 08/19/2018 0304   K 3.2 (L) 08/18/2018 1630   K 3.6 10/20/2012 0510   CL 108 08/19/2018 0304   CL 112 (H) 08/18/2018 1630   CL 102 10/20/2012 0510   CO2 17 (L) 08/19/2018 0304   CO2 19 (L) 08/18/2018 1630   CO2 23 10/20/2012 0510    GLUCOSE 174 (H) 08/19/2018 0304   GLUCOSE 130 (H) 08/18/2018 1630   GLUCOSE 107 (H) 10/20/2012 0510   BUN 29 (H) 08/19/2018 0304   BUN 23 08/18/2018 1630   BUN 13 10/20/2012 0510   CREATININE 2.81 (H) 08/19/2018 0304   CREATININE 2.68 (H) 08/18/2018 1630   CREATININE 1.43 (H) 10/20/2012 0510   CALCIUM 8.4 (L) 08/19/2018 0304   CALCIUM 8.4 (L) 08/18/2018 1630   CALCIUM 8.3 (L) 10/20/2012 0510   GFRNONAA 22 (L) 08/19/2018 0304   GFRNONAA 23 (L) 08/18/2018 1630   GFRNONAA 51 (L) 10/20/2012 0510   GFRAA 26 (L) 08/19/2018 0304   GFRAA 27 (L) 08/18/2018 1630   GFRAA 59 (L) 10/20/2012 0510   CBC    Component Value Date/Time   WBC 16.4 (H) 08/19/2018 0304   RBC 3.16 (L) 08/19/2018 0304   HGB 9.1 (L) 08/19/2018 0304   HCT 29.0 (L) 08/19/2018 0304   PLT 205 08/19/2018 0304   MCV 91.8 08/19/2018 0304   MCH 28.8 08/19/2018 0304   MCHC 31.4 08/19/2018 0304   RDW 13.2 08/19/2018 0304   LYMPHSABS 1.7 10/20/2012 0510   MONOABS 0.8 10/20/2012 0510   EOSABS 0.1 10/20/2012 0510   BASOSABS 0.0 10/20/2012 0510   HEPATIC Function Panel Recent Labs    08/18/18 1630  PROT 6.1*   HEMOGLOBIN A1C No components found for: HGA1C,  MPG  CARDIAC ENZYMES Lab Results  Component Value Date   CKTOTAL 35 09/10/2012   TROPONINI 31.83 (HH) 08/19/2018   TROPONINI 17.30 (HH) 08/19/2018   TROPONINI 4.47 (HH) 08/18/2018   BNP No results for input(s): PROBNP in the last 8760 hours. TSH No results for input(s): TSH in the last 8760 hours. CHOLESTEROL No results for input(s): CHOL in the last 8760 hours.  Scheduled Meds: . aspirin EC  81 mg Oral q morning - 10a  . atorvastatin  10 mg Oral q morning - 10a  . insulin aspart  0-9 Units Subcutaneous TID WC  . metoprolol tartrate  2.5 mg Intravenous Q6H  . multivitamin with minerals  1 tablet Oral q morning - 10a  . tamsulosin  0.4 mg Oral q morning - 10a   Continuous Infusions: . cefTRIAXone (ROCEPHIN)  IV    . heparin 950 Units/hr (08/19/18  1632)   PRN Meds:.acetaminophen **OR** acetaminophen, nitroGLYCERIN, ondansetron **OR** ondansetron (ZOFRAN) IV  Assessment/Plan: Acute inferior wall MI Acute renal failure CAD S/P CABG Type 2 DM HTN UTI PVD Anemia of chronic disease  Consider cardiac cath if has recurrence of CP. IV fluids for renal perfusion Change to liquid diet for nausea. Continue antibiotics.   LOS: 1 day    Dixie Dials  MD  08/19/2018, 4:39 PM

## 2018-08-19 NOTE — Progress Notes (Addendum)
Third trop is 71!  Overnight, vomited once, but remains CP free.  And "my breathing is just fine" (not SOB at all) he tells me.  Ill give cards a call to update them given the unexpected degree of troponin increase.  Remains on heparin gtt.  Creat this am is 2.8 though... Will see if cards wants him NPO or not.  Addendum: Cards didn't want him NPO. Not planning on immediate cath given the creat.  Had 2nd episode of vomiting at 0500.  Vomit is NBNB (despite heparin gtt) as it was for the 1 episode earlier tonight.  Was some question / report by patient of small amount of blood in vomit earlier before I saw him last evening though.  Patient continues to deny CP or SOB at this time to me and cardiologist who is now also at bedside.

## 2018-08-19 NOTE — Progress Notes (Signed)
PROGRESS NOTE    [name]  VOH:[id number] DOB: 09-18-1950 DOA: 08/18/2018 PCP: Boneta Lucks, MD   Brief Narrative: [name] is a 68 y.o. 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 Patient presented to the hospital secondary to chest pain and found to have an NSTEMI. Also evidence of UTI. Started on heparin and ceftriaxone. Cardiology consulted.   Assessment & Plan:   Principal Problem:   Sepsis secondary to UTI [facility]) Active Problems:   S/P CABG x 4   Diabetes mellitus type 2, uncontrolled, with complications ([place])   Chronic systolic heart failure (HCC)   NSTEMI (non-ST elevated myocardial infarction) ([place])   Renal insufficiency   Sepsis Present on admission. Leukocytosis and fever. Secondary to UTI. Empirically treating with ceftriaxone. Blood and urine cultures pending.  NSTEMI Patient with significant CAD history. S/p CABG x4. Currently declining cath secondary to risk of kidney failure from contrast. High risk patient. Heparin drip started. Cardiology on board. For now, plan is for medical management.  AKI on CKD III Baseline creatinine of 1.4-1.5 but from 6 years ago. Creatinine of 2.68 on admission and trended up slightly. Unsure if this is AKI vs CKD.  Diabetes mellitus, type 2 Listed on history. Not on any medications as an outpatient. -Continue SSI  Atrial flutter Per chart review. On metoprolol. -Continue metoprolol   DVT prophylaxis: Heparin drip Code Status:   Code Status: Full Code Family Communication: None Disposition Plan: Pending continued cardiac management and treatment of likely infection   Consultants:   Cardiology  Procedures:   None  Antimicrobials:  Ceftriaxone    Subjective: No chest pain. Vomiting overnight.  Objective: Vitals:   08/18/18 2307 08/19/18 0000 08/19/18 0117 08/19/18 0613  BP:  133/74 (!) 146/78 138/78  Pulse: (!) 101  (!) 102 (!) 104 (!) 102  Resp:  20 (!) 37   Temp:   98.7 F (37.1 C) 99.7 F (37.6 C)  TempSrc:   Oral Oral  SpO2: 99% 99% 98% 100%  Weight:   81.6 kg   Height:        Intake/Output Summary (Last 24 hours) at 08/19/2018 1251 Last data filed at 08/19/2018 0650 Gross per 24 hour  Intake 1805.01 ml  Output -  Net 1805.01 ml   Filed Weights   08/18/18 1620 08/19/18 0117  Weight: 81.6 kg 81.6 kg    Examination:  General exam: Appears calm and comfortable Respiratory system: Clear to auscultation. Respiratory effort normal. Cardiovascular system: S1 & S2 heard, RRR. No murmurs, rubs, gallops or clicks. Gastrointestinal system: Abdomen is nondistended, soft and nontender. No organomegaly or masses felt. Normal bowel sounds heard. Central nervous system: Alert and oriented. No focal neurological deficits. Extremities: Bilateral BKA Skin: No cyanosis. No rashes Psychiatry: Judgement and insight appear normal. Mood & affect appropriate.     Data Reviewed: I have personally reviewed following labs and imaging studies  CBC: Recent Labs  Lab 08/18/18 2136 08/19/18 0304  WBC 18.6* 16.4*  HGB 8.5* 9.1*  HCT 27.5* 29.0*  MCV 94.2 91.8  PLT 280 694   Basic Metabolic Panel: Recent Labs  Lab 08/18/18 1630 08/19/18 0304  NA 142 138  K 3.2* 4.1  CL 112* 108  CO2 19* 17*  GLUCOSE 130* 174*  BUN 23 29*  CREATININE 2.68* 2.81*  CALCIUM 8.4* 8.4*  MG 1.7  --    GFR: Estimated Creatinine Clearance: 25.6  mL/min (A) (by C-G formula based on SCr of 2.81 mg/dL (H)). Liver Function Tests: Recent Labs  Lab 08/18/18 1630  AST 20  ALT 12  ALKPHOS 62  BILITOT 0.8  PROT 6.1*  ALBUMIN 3.2*   No results for input(s): LIPASE, AMYLASE in the last 168 hours. No results for input(s): AMMONIA in the last 168 hours. Coagulation Profile: No results for input(s): INR, PROTIME in the last 168 hours. Cardiac Enzymes: Recent Labs  Lab 08/18/18 1630 08/18/18 2136 08/19/18 0304   TROPONINI 0.16* 4.47* 17.30*   BNP (last 3 results) No results for input(s): PROBNP in the last 8760 hours. HbA1C: No results for input(s): HGBA1C in the last 72 hours. CBG: Recent Labs  Lab 08/18/18 1623 08/18/18 2314 08/19/18 0753 08/19/18 1137  GLUCAP 120* 147* 164* 154*   Lipid Profile: No results for input(s): CHOL, HDL, LDLCALC, TRIG, CHOLHDL, LDLDIRECT in the last 72 hours. Thyroid Function Tests: No results for input(s): TSH, T4TOTAL, FREET4, T3FREE, THYROIDAB in the last 72 hours. Anemia Panel: No results for input(s): VITAMINB12, FOLATE, FERRITIN, TIBC, IRON, RETICCTPCT in the last 72 hours. Sepsis Labs: Recent Labs  Lab 08/18/18 1633 08/18/18 1833  LATICACIDVEN 2.0* 1.5    Recent Results (from the past 240 hour(s))  SARS Coronavirus 2 (CEPHEID- Performed in [facility] hospital lab), Hosp Order     Status: None   Collection Time: 08/18/18  5:04 PM   Specimen: Urine, Clean Catch; Nasopharyngeal  Result Value Ref Range Status   SARS Coronavirus 2 NEGATIVE NEGATIVE Final    Comment: (NOTE) If result is NEGATIVE SARS-CoV-2 target nucleic acids are NOT DETECTED. The SARS-CoV-2 RNA is generally detectable in upper and lower  respiratory specimens during the acute phase of infection. The lowest  concentration of SARS-CoV-2 viral copies this assay can detect is 250  copies / mL. A negative result does not preclude SARS-CoV-2 infection  and should not be used as the sole basis for treatment or other  patient management decisions.  A negative result may occur with  improper specimen collection / handling, submission of specimen other  than nasopharyngeal swab, presence of viral mutation(s) within the  areas targeted by this assay, and inadequate number of viral copies  (<250 copies / mL). A negative result must be combined with clinical  observations, patient history, and epidemiological information. If result is POSITIVE SARS-CoV-2 target nucleic acids are  DETECTED. The SARS-CoV-2 RNA is generally detectable in upper and lower  respiratory specimens dur ing the acute phase of infection.  Positive  results are indicative of active infection with SARS-CoV-2.  Clinical  correlation with patient history and other diagnostic information is  necessary to determine patient infection status.  Positive results do  not rule out bacterial infection or co-infection with other viruses. If result is PRESUMPTIVE POSTIVE SARS-CoV-2 nucleic acids MAY BE PRESENT.   A presumptive positive result was obtained on the submitted specimen  and confirmed on repeat testing.  While 2019 novel coronavirus  (SARS-CoV-2) nucleic acids may be present in the submitted sample  additional confirmatory testing may be necessary for epidemiological  and / or clinical management purposes  to differentiate between  SARS-CoV-2 and other Sarbecovirus currently known to infect humans.  If clinically indicated additional testing with an alternate test  methodology [phone]) is advised. The SARS-CoV-2 RNA is generally  detectable in upper and lower respiratory sp ecimens during the acute  phase of infection. The expected result is Negative. Fact Sheet for Patients:  StrictlyIdeas.no  Fact Sheet for Healthcare Providers: BankingDealers.co.za This test is not yet approved or cleared by the [place] FDA and has been authorized for detection and/or diagnosis of SARS-CoV-2 by FDA under an Emergency Use Authorization (EUA).  This EUA will remain in effect (meaning this test can be used) for the duration of the COVID-19 declaration under Section 564(b)(1) of the Act, 21 U.S.C. section 360bbb-3(b)(1), unless the authorization is terminated or revoked sooner. Performed at [facility], [place] [address] [place], [place] [address]   Blood culture (routine x 2)     Status: None (Preliminary result)   Collection Time: 08/18/18  5:10  PM   Specimen: BLOOD  Result Value Ref Range Status   Specimen Description BLOOD RIGHT ANTECUBITAL  Final   Special Requests   Final    BOTTLES DRAWN AEROBIC AND ANAEROBIC Blood Culture results may not be optimal due to an inadequate volume of blood received in culture bottles   Culture   Final    NO GROWTH < 24 HOURS Performed at [facility], [place] [address], [place], [place] [address]    Report Status PENDING  Incomplete  Blood culture (routine x 2)     Status: None (Preliminary result)   Collection Time: 08/18/18  5:11 PM   Specimen: BLOOD  Result Value Ref Range Status   Specimen Description BLOOD BLOOD LEFT FOREARM  Final   Special Requests   Final    BOTTLES DRAWN AEROBIC AND ANAEROBIC Blood Culture results may not be optimal due to an inadequate volume of blood received in culture bottles   Culture   Final    NO GROWTH < 24 HOURS Performed at [facility], [place]  [address] [place], [place] [address]    Report Status PENDING  Incomplete         Radiology Studies: Dg Chest Portable 1 View  Result Date: 08/18/2018 CLINICAL DATA:  Chest pain today with fever. EXAM: PORTABLE CHEST 1 VIEW COMPARISON:  10/10/2012 FINDINGS: Stable changes from prior CABG surgery. The cardiac silhouette is borderline enlarged. No mediastinal or hilar masses or convincing adenopathy. Clear lungs.  No pleural effusion or pneumothorax. Old, healed left rib fracture.  No acute skeletal abnormality. IMPRESSION: No acute cardiopulmonary disease. Electronically Signed   By: Lajean Manes M.D.   On: 08/18/2018 18:25        Scheduled Meds: . aspirin EC  81 mg Oral q morning - 10a  . atorvastatin  10 mg Oral q morning - 10a  . insulin aspart  0-9 Units Subcutaneous TID WC  . metoprolol succinate  50 mg Oral q morning - 10a  . multivitamin with minerals  1 tablet Oral q morning - 10a  . tamsulosin  0.4 mg Oral q morning - 10a   Continuous Infusions: . cefTRIAXone (ROCEPHIN)  IV    .  heparin 1,100 Units/hr (08/19/18 0805)     LOS: 1 day     Cordelia Poche, MD [facility] 08/19/2018, 12:51 PM  If 7PM-7AM, please contact night-coverage [email or web address]

## 2018-08-19 NOTE — Progress Notes (Signed)
     Went to check on the patient given his rise in cardiac enzymes. He remains asymptomatic at this time. Denies any chest pain or dyspnea. Says his nausea resolved after he ate lunch.   In talking with the patient, he informed me that Dr. Terrence Dupont is his Primary Cardiologist. Will add Dr. Terrence Dupont to the Care Team.   Signed, Erma Heritage, PA-C 08/19/2018, 3:19 PM Pager: [phone]

## 2018-08-19 NOTE — Consult Note (Signed)
Cardiology Consult    Patient ID: [name] MRN: [id number], DOB/AGE: [birth year]   Admit date: 08/18/2018 Date of Consult: 08/19/2018  Primary Physician: Boneta Lucks, MD Primary Cardiologist: No primary care provider on file. Requesting Provider: Dr. Alcario Drought     Past Medical History   Past Medical History:  Diagnosis Date  . Exertional shortness of breath    "before my last OR; I'm fine now" (09/26/2012)  . GERD (gastroesophageal reflux disease)   . High cholesterol   . Hx of seasonal allergies   . Hypertension   . Myocardial infarction ([place])   . Stroke [facility]) 2009   memory loss  . Type II diabetes mellitus ([place])     Past Surgical History:  Procedure Laterality Date  . AMPUTATION Right 07/29/2012   Procedure: AMPUTATION 3RD TOE;  Surgeon: Kerin Salen, MD;  Location: [place];  Service: Orthopedics;  Laterality: Right;  right third toe amputation  . AMPUTATION Right 08/02/2012   Procedure: right transmetatarsal amputation;  Surgeon: Kerin Salen, MD;  Location: [place];  Service: Orthopedics;  Laterality: Right;  . AMPUTATION Right 09/06/2012   Procedure: AMPUTATION BELOW KNEE;  Surgeon: Kerin Salen, MD;  Location: [place];  Service: Orthopedics;  Laterality: Right;  . AMPUTATION Left 09/27/2012   Procedure: AMPUTATION BELOW KNEE;  Surgeon: Kerin Salen, MD;  Location: [place];  Service: Orthopedics;  Laterality: Left;  . CATARACT EXTRACTION W/ INTRAOCULAR LENS  IMPLANT, BILATERAL  ?2010  . 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 ESOPHAGOGASTRODUODENOSCOPY N/A 10/08/2012   Procedure: ESOPHAGOGASTRODUODENOSCOPY (EGD);  Surgeon: Winfield Cunas., MD;  Location: [facility];  Service: Endoscopy;  Laterality: N/A;  . PERIPHERALLY INSERTED CENTRAL CATHETER INSERTION Right 09/2012   upper arm     Allergies  No Known Allergies  History of Present Illness    [name] is a 68 y.o. 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  Patient presents to the ED at [facility] with c/o CP, fever, N/V.  Patient ate breakfast this morning around 10, started having N/V around 11.  After several episodes of vomiting he began to have CP around noon. In the initial vomit he noticed small amount of blood. He had blood in his vomit before in January. Has not been worked up before.   Cardiology Lacie Draft) were called about the initial ECG which showed diffuse ST depressions. LHC was deferred. Patient subsequently became CP free. Heparin was started.   When I interview him at bedside he is vomiting, no CP no SOB. No blood in vomit  Trop increase to 17.  Inpatient Medications    . aspirin EC  81 mg Oral q morning - 10a  . atorvastatin  10 mg Oral q morning - 10a  . insulin aspart  0-9 Units Subcutaneous TID WC  . metoprolol succinate  50 mg Oral q morning - 10a  . multivitamin with minerals  1 tablet Oral q morning - 10a  . tamsulosin  0.4 mg Oral q morning - 10a    Family History    No family history on file. has no family status information on file.    Social History    Social History   Socioeconomic History  . Marital status: Legally Separated    Spouse name: Not on file  . Number of children: Not on file  .  Years of education: Not on file  . Highest education level: Not on file  Occupational History  . Not on file  Social Needs  . Financial resource strain: Not on file  . Food insecurity    Worry: Not on file    Inability: Not on file  . Transportation needs    Medical: Not on file    Non-medical: Not on file  Tobacco Use  . Smoking status: Former Smoker    Packs/day: 0.12    Years: 20.00    Pack years: 2.40    Types: Cigarettes  . Smokeless tobacco: Never Used  Substance and Sexual Activity  . Alcohol use: No    Comment: last drink 2010  . Drug use: No  . Sexual activity: Never  Lifestyle  . Physical activity    Days per week: Not on file     Minutes per session: Not on file  . Stress: Not on file  Relationships  . Social Herbalist on phone: Not on file    Gets together: Not on file    Attends religious service: Not on file    Active member of club or organization: Not on file    Attends meetings of clubs or organizations: Not on file    Relationship status: Not on file  . Intimate partner violence    Fear of current or ex partner: Not on file    Emotionally abused: Not on file    Physically abused: Not on file    Forced sexual activity: Not on file  Other Topics Concern  . Not on file  Social History Narrative  . Not on file     Review of Systems    General:  No chills, fever, night sweats or weight changes.  Cardiovascular:  No chest pain, dyspnea on exertion, edema, orthopnea, palpitations, paroxysmal nocturnal dyspnea. Dermatological: No rash, lesions/masses Respiratory: No cough, dyspnea Urologic: No hematuria, dysuria Abdominal:   No nausea, vomiting, diarrhea, bright red blood per rectum, melena, or hematemesis Neurologic:  No visual changes, wkns, changes in mental status. All other systems reviewed and are otherwise negative except as noted above.  Physical Exam    Blood pressure (!) 146/78, pulse (!) 104, temperature 98.7 F (37.1 C), temperature source Oral, resp. rate (!) 37, height 5' 9.5" (1.765 m), weight 81.6 kg, SpO2 98 %.  General: in mild distress, nauseaus Psych: Normal affect. Neuro: Alert and oriented X 3. Moves all extremities spontaneously. HEENT: no teeth  Neck: Supple without bruits or JVD. Lungs:  Resp regular and unlabored, CTA. Heart: RRR no s3, s4, or murmurs. Abdomen: Soft, non-tender, non-distended, BS + x 4.  Extremities: No clubbing, cyanosis or edema. DP/PT/Radials 2+ and equal bilaterally.  Labs    Troponin (Point of Care Test) No results for input(s): TROPIPOC in the last 72 hours. Recent Labs    08/18/18 1630 08/18/18 2136 08/19/18 0304  TROPONINI  0.16* 4.47* 17.30*   Lab Results  Component Value Date   WBC 16.4 (H) 08/19/2018   HGB 9.1 (L) 08/19/2018   HCT 29.0 (L) 08/19/2018   MCV 91.8 08/19/2018   PLT 205 08/19/2018    Recent Labs  Lab 08/18/18 1630 08/19/18 0304  NA 142 138  K 3.2* 4.1  CL 112* 108  CO2 19* 17*  BUN 23 29*  CREATININE 2.68* 2.81*  CALCIUM 8.4* 8.4*  PROT 6.1*  --   BILITOT 0.8  --   ALKPHOS 62  --  ALT 12  --   AST 20  --   GLUCOSE 130* 174*   Lab Results  Component Value Date   CHOL 137 07/28/2012   HDL 7 (L) 07/28/2012   LDLCALC 105 (H) 07/28/2012   TRIG 123 07/28/2012   No results found for: [facility]   Radiology Studies    Dg Chest Portable 1 View  Result Date: 08/18/2018 CLINICAL DATA:  Chest pain today with fever. EXAM: PORTABLE CHEST 1 VIEW COMPARISON:  10/10/2012 FINDINGS: Stable changes from prior CABG surgery. The cardiac silhouette is borderline enlarged. No mediastinal or hilar masses or convincing adenopathy. Clear lungs.  No pleural effusion or pneumothorax. Old, healed left rib fracture.  No acute skeletal abnormality. IMPRESSION: No acute cardiopulmonary disease. Electronically Signed   By: Lajean Manes M.D.   On: 08/18/2018 18:25    ECG & Cardiac Imaging    NSR with global ST depressions, Improved from admission ECG  Assessment & Plan    60 y o man with multiple co morbidities including CAD, DM, CKD. Now with NSTEMI in setting of fever and GI upset.  ECG has improved. Initial CP symptoms are no longer present. Troponin is rising quickly. Based on presenting ECG and troponin trend this could very well be a significant coronary lesion exacerbated by physiological stress,Given his CKD and description of blood in vomit on initial presentation increases his risk for complications. The risks were discussed with the patient. At this time point he is not interested in LHC/PCI. Discussed the case with Dr. Alcario Drought and the patient and all are in agreement with plan for continued  medical management and reevlation in the next 24h.  Recommendation: - Cont on Heparin but be vigilant for bleeding  - Cont on ASA - Ok with metop and atorva - Will reevluate for LHC  - TTE - treat underlying infection and gently hydrate  Signed, Cristina Gong, MD 08/19/2018, 5:47 AM  For questions or updates, please contact   Please consult [email or web address] for contact info under Cardiology/STEMI.

## 2018-08-20 ENCOUNTER — Inpatient Hospital Stay (HOSPITAL_COMMUNITY): Payer: Medicare HMO

## 2018-08-20 LAB — HEPARIN LEVEL (UNFRACTIONATED)
Heparin Unfractionated: 0.42 IU/mL (ref 0.30–0.70)
Heparin Unfractionated: 0.31 IU/mL (ref 0.30–0.70)

## 2018-08-20 LAB — CBC
HCT: 29.9 % — ABNORMAL LOW (ref 39.0–52.0)
Hemoglobin: 8.8 g/dL — ABNORMAL LOW (ref 13.0–17.0)
MCH: 28.6 pg (ref 26.0–34.0)
MCHC: 29.4 g/dL — ABNORMAL LOW (ref 30.0–36.0)
MCV: 97.1 fL (ref 80.0–100.0)
Platelets: 192 10*3/uL (ref 150–400)
RBC: 3.08 MIL/uL — ABNORMAL LOW (ref 4.22–5.81)
RDW: 13.4 % (ref 11.5–15.5)
WBC: 13.3 10*3/uL — ABNORMAL HIGH (ref 4.0–10.5)
nRBC: 0 % (ref 0.0–0.2)

## 2018-08-20 LAB — BASIC METABOLIC PANEL
Anion gap: 14 (ref 5–15)
BUN: 33 mg/dL — ABNORMAL HIGH (ref 8–23)
CO2: 16 mmol/L — ABNORMAL LOW (ref 22–32)
Calcium: 8.6 mg/dL — ABNORMAL LOW (ref 8.9–10.3)
Chloride: 107 mmol/L (ref 98–111)
Creatinine, Ser: 3.21 mg/dL — ABNORMAL HIGH (ref 0.61–1.24)
GFR calc Af Amer: 22 mL/min — ABNORMAL LOW (ref >60)
GFR calc non Af Amer: 19 mL/min — ABNORMAL LOW (ref >60)
Glucose, Bld: 216 mg/dL — ABNORMAL HIGH (ref 70–99)
Potassium: 3.9 mmol/L (ref 3.5–5.1)
Sodium: 137 mmol/L (ref 135–145)

## 2018-08-20 LAB — GLUCOSE, CAPILLARY
Glucose-Capillary: 193 mg/dL — ABNORMAL HIGH (ref 70–99)
Glucose-Capillary: 266 mg/dL — ABNORMAL HIGH (ref 70–99)
Glucose-Capillary: 197 mg/dL — ABNORMAL HIGH (ref 70–99)
Glucose-Capillary: 179 mg/dL — ABNORMAL HIGH (ref 70–99)

## 2018-08-20 LAB — TROPONIN I
Troponin I: 30.75 ng/mL (ref <0.03)
Troponin I: 25.53 ng/mL (ref <0.03)

## 2018-08-20 NOTE — Progress Notes (Signed)
PROGRESS NOTE    [name]  HYQ:[id number] DOB: 10-07-1950 DOA: 08/18/2018 PCP: Boneta Lucks, MD   Brief Narrative: [name] is a 68 y.o. 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 Patient presented to the hospital secondary to chest pain and found to have an NSTEMI. Also evidence of UTI. Started on heparin and ceftriaxone. Cardiology consulted.   Assessment & Plan:   Principal Problem:   Sepsis secondary to UTI [facility]) Active Problems:   S/P CABG x 4   Diabetes mellitus type 2, uncontrolled, with complications ([place])   Chronic systolic heart failure (HCC)   NSTEMI (non-ST elevated myocardial infarction) ([place])   Renal insufficiency   Sepsis Present on admission. Leukocytosis and fever. Secondary to UTI. Empirically treating with ceftriaxone. Blood and urine cultures pending.  UTI Urine culture significant for GNR. On empiric ceftriaxone. Blood cultures with no growth -Continue ceftriaxone -Urine culture results pending  NSTEMI Patient with significant CAD history. S/p CABG x4. Currently declining cath secondary to risk of kidney failure from contrast. High risk patient. Heparin drip started. Cardiology on board. For now, plan is for medical management. Troponin trending down.  AKI on CKD III Baseline creatinine of 1.4-1.5 but from 6 years ago. Creatinine of 2.68 on admission and continues to trend up. -Renal ultrasound -Strict in and out/daily weights -If continues to worsen, will consult nephrology  Chronic systolic heart failure EF of 35-40%. Euvolemic. -Cardiology recommendations  Diabetes mellitus, type 2 Listed on history. Not on any medications as an outpatient. Hemoglobin A1C of 5.6%. -Continue SSI  Atrial flutter Per chart review. On metoprolol. -Continue metoprolol   DVT prophylaxis: Heparin drip Code Status:   Code Status: Full Code Family Communication: None  Disposition Plan: Pending continued cardiac management and treatment of likely infection   Consultants:   Cardiology  Procedures:   None  Antimicrobials:  Ceftriaxone    Subjective: No chest pain.  Objective: Vitals:   08/19/18 0613 08/19/18 1319 08/19/18 2140 08/20/18 0524  BP: 138/78 (!) 152/87 (!) 160/86 (!) 159/91  Pulse: (!) 102 95 (!) 106 (!) 115  Resp:   20 20  Temp: 99.7 F (37.6 C)  99.9 F (37.7 C) 99.6 F (37.6 C)  TempSrc: Oral Oral Oral Oral  SpO2: 100% 96% 100% 98%  Weight:    81.6 kg  Height:        Intake/Output Summary (Last 24 hours) at 08/20/2018 1221 Last data filed at 08/20/2018 0700 Gross per 24 hour  Intake 294.07 ml  Output 300 ml  Net -5.93 ml   Filed Weights   08/18/18 1620 08/19/18 0117 08/20/18 0524  Weight: 81.6 kg 81.6 kg 81.6 kg    Examination:  General exam: Appears calm and comfortable Respiratory system: Clear to auscultation. Respiratory effort normal. Cardiovascular system: S1 & S2 heard, RRR. No murmurs, rubs, gallops or clicks. Gastrointestinal system: Abdomen is nondistended, soft and nontender. No organomegaly or masses felt. Normal bowel sounds heard. Central nervous system: Alert and oriented. No focal neurological deficits. Extremities: No edema. Bilateral BKA Skin: No cyanosis. No rashes Psychiatry: Judgement and insight appear normal. Mood & affect appropriate.     Data Reviewed: I have personally reviewed following labs and imaging studies  CBC: Recent Labs  Lab 08/18/18 2136 08/19/18 0304 08/20/18 0735  WBC 18.6* 16.4* 13.3*  HGB 8.5* 9.1* 8.8*  HCT 27.5* 29.0* 29.9*  MCV 94.2 91.8 97.1  PLT 280 205 161   Basic Metabolic Panel: Recent Labs  Lab 08/18/18 1630 08/19/18 0304 08/20/18 0735  NA 142 138 137  K 3.2* 4.1 3.9  CL 112* 108 107  CO2 19* 17* 16*  GLUCOSE 130* 174* 216*  BUN 23 29* 33*  CREATININE 2.68* 2.81* 3.21*  CALCIUM 8.4* 8.4* 8.6*  MG 1.7  --   --    GFR: Estimated  Creatinine Clearance: 22.4 mL/min (A) (by C-G formula based on SCr of 3.21 mg/dL (H)). Liver Function Tests: Recent Labs  Lab 08/18/18 1630  AST 20  ALT 12  ALKPHOS 62  BILITOT 0.8  PROT 6.1*  ALBUMIN 3.2*   No results for input(s): LIPASE, AMYLASE in the last 168 hours. No results for input(s): AMMONIA in the last 168 hours. Coagulation Profile: No results for input(s): INR, PROTIME in the last 168 hours. Cardiac Enzymes: Recent Labs  Lab 08/19/18 0304 08/19/18 1142 08/19/18 1536 08/20/18 0003 08/20/18 0735  TROPONINI 17.30* 31.83* 35.22* 30.75* 25.53*   BNP (last 3 results) No results for input(s): PROBNP in the last 8760 hours. HbA1C: Recent Labs    08/19/18 0304  HGBA1C 5.6   CBG: Recent Labs  Lab 08/19/18 1137 08/19/18 1623 08/19/18 2154 08/20/18 0718 08/20/18 1113  GLUCAP 154* 163* 201* 193* 266*   Lipid Profile: No results for input(s): CHOL, HDL, LDLCALC, TRIG, CHOLHDL, LDLDIRECT in the last 72 hours. Thyroid Function Tests: No results for input(s): TSH, T4TOTAL, FREET4, T3FREE, THYROIDAB in the last 72 hours. Anemia Panel: No results for input(s): VITAMINB12, FOLATE, FERRITIN, TIBC, IRON, RETICCTPCT in the last 72 hours. Sepsis Labs: Recent Labs  Lab 08/18/18 1633 08/18/18 1833  LATICACIDVEN 2.0* 1.5    Recent Results (from the past 240 hour(s))  Urine culture     Status: Abnormal (Preliminary result)   Collection Time: 08/18/18  4:32 PM   Specimen: Urine, Random  Result Value Ref Range Status   Specimen Description URINE, RANDOM  Final   Special Requests   Final    NONE Performed at [facility], [place] [address] [place], [place] [address]    Culture >=100,000 COLONIES/mL GRAM NEGATIVE RODS (A)  Final   Report Status PENDING  Incomplete  SARS Coronavirus 2 (CEPHEID- Performed in [place] hospital lab), Hosp Order     Status: None   Collection Time: 08/18/18  5:04 PM   Specimen: Urine, Clean Catch; Nasopharyngeal  Result Value  Ref Range Status   SARS Coronavirus 2 NEGATIVE NEGATIVE Final    Comment: (NOTE) If result is NEGATIVE SARS-CoV-2 target nucleic acids are NOT DETECTED. The SARS-CoV-2 RNA is generally detectable in upper and lower  respiratory specimens during the acute phase of infection. The lowest  concentration of SARS-CoV-2 viral copies this assay can detect is 250  copies / mL. A negative result does not preclude SARS-CoV-2 infection  and should not be used as the sole basis for treatment or other  patient management decisions.  A negative result may occur with  improper specimen collection / handling, submission of specimen other  than nasopharyngeal swab, presence of viral mutation(s) within the  areas targeted by this assay, and inadequate number of viral copies  (<250 copies / mL). A negative result must be combined with clinical  observations, patient history, and epidemiological information. If result is POSITIVE SARS-CoV-2 target nucleic acids are DETECTED. The SARS-CoV-2 RNA is generally detectable in upper and lower  respiratory specimens dur ing the acute phase of infection.  Positive  results are indicative of active infection with SARS-CoV-2.  Clinical  correlation with patient history and other diagnostic information is  necessary to determine patient infection status.  Positive results do  not rule out bacterial infection or co-infection with other viruses. If result is PRESUMPTIVE POSTIVE SARS-CoV-2 nucleic acids MAY BE PRESENT.   A presumptive positive result was obtained on the submitted specimen  and confirmed on repeat testing.  While 2019 novel coronavirus  (SARS-CoV-2) nucleic acids may be present in the submitted sample  additional confirmatory testing may be necessary for epidemiological  and / or clinical management purposes  to differentiate between  SARS-CoV-2 and other Sarbecovirus currently known to infect humans.  If clinically indicated additional testing with an  alternate test  methodology [phone]) is advised. The SARS-CoV-2 RNA is generally  detectable in upper and lower respiratory sp ecimens during the acute  phase of infection. The expected result is Negative. Fact Sheet for Patients:  StrictlyIdeas.no Fact Sheet for Healthcare Providers: BankingDealers.co.za This test is not yet approved or cleared by the [place] FDA and has been authorized for detection and/or diagnosis of SARS-CoV-2 by FDA under an Emergency Use Authorization (EUA).  This EUA will remain in effect (meaning this test can be used) for the duration of the COVID-19 declaration under Section 564(b)(1) of the Act, 21 U.S.C. section 360bbb-3(b)(1), unless the authorization is terminated or revoked sooner. Performed at [facility], [place] [address], [place], [place] [address]   Blood culture (routine x 2)     Status: None (Preliminary result)   Collection Time: 08/18/18  5:10 PM   Specimen: BLOOD  Result Value Ref Range Status   Specimen Description BLOOD RIGHT ANTECUBITAL  Final   Special Requests   Final    BOTTLES DRAWN AEROBIC AND ANAEROBIC Blood Culture results may not be optimal due to an inadequate volume of blood received in culture bottles   Culture   Final    NO GROWTH 2 DAYS Performed at [facility], [place] [address] [place], [place] [address]    Report Status PENDING  Incomplete  Blood culture (routine x 2)     Status: None (Preliminary result)   Collection Time: 08/18/18  5:11 PM   Specimen: BLOOD  Result Value Ref Range Status   Specimen Description BLOOD BLOOD LEFT FOREARM  Final   Special Requests   Final    BOTTLES DRAWN AEROBIC AND ANAEROBIC Blood Culture results may not be optimal due to an inadequate volume of blood received in culture bottles   Culture   Final    NO GROWTH 2 DAYS Performed at [facility], [place] [address] [place], [place] [address]    Report Status PENDING   Incomplete         Radiology Studies: Dg Chest Portable 1 View  Result Date: 08/18/2018 CLINICAL DATA:  Chest pain today with fever. EXAM: PORTABLE CHEST 1 VIEW COMPARISON:  10/10/2012 FINDINGS: Stable changes from prior CABG surgery. The cardiac silhouette is borderline enlarged. No mediastinal or hilar masses or convincing adenopathy. Clear lungs.  No pleural effusion or pneumothorax. Old, healed left rib fracture.  No acute skeletal abnormality. IMPRESSION: No acute cardiopulmonary disease. Electronically Signed   By: Lajean Manes M.D.   On: 08/18/2018 18:25        Scheduled Meds: . aspirin EC  81 mg Oral q morning - 10a  . atorvastatin  10 mg Oral q morning - 10a  .  insulin aspart  0-9 Units Subcutaneous TID WC  . metoprolol tartrate  2.5 mg Intravenous Q6H  . multivitamin with minerals  1 tablet Oral q morning - 10a  . tamsulosin  0.4 mg Oral q morning - 10a   Continuous Infusions: . sodium chloride 50 mL/hr at 08/19/18 1702  . cefTRIAXone (ROCEPHIN)  IV 1 g (08/19/18 1959)  . heparin 950 Units/hr (08/19/18 1703)     LOS: 2 days     Cordelia Poche, MD [facility] 08/20/2018, 12:21 PM  If 7PM-7AM, please contact night-coverage [email or web address]

## 2018-08-20 NOTE — Progress Notes (Signed)
Ref: Boneta Lucks, MD   Subjective:  Awake. No chest pain. On IV heparin. T. Max 99.9 degree F.  Troponin I is trending down.  WBC count trending down.  Creatinine up to 3.21. Glucose elevated.  Objective:  Vital Signs in the last 24 hours: Temp:  [99.6 F (37.6 C)-99.9 F (37.7 C)] 99.6 F (37.6 C) (06/14 0524) Pulse Rate:  [95-115] 115 (06/14 0524) Cardiac Rhythm: Sinus tachycardia;Bundle branch block (06/14 0752) Resp:  [20] 20 (06/14 0524) BP: (152-160)/(86-91) 159/91 (06/14 0524) SpO2:  [96 %-100 %] 98 % (06/14 0524) Weight:  [81.6 kg] 81.6 kg (06/14 0524)  Physical Exam: BP Readings from Last 1 Encounters:  08/20/18 (!) 159/91     Wt Readings from Last 1 Encounters:  08/20/18 81.6 kg    Weight change: 0 kg Body mass index is 26.19 kg/m. HEENT: [place]/AT, Eyes-Brown, PERL, EOMI, Conjunctiva-Pale, Sclera-Non-icteric Neck: No JVD, No bruit, Trachea midline. Lungs:  Clear, Bilateral. Cardiac:  Regular rhythm, normal S1 and S2, no S3. II/VI systolic murmur. Abdomen:  Soft, non-tender. BS present. Extremities:  No edema present. No cyanosis. No clubbing. Bil. BKA. CNS: AxOx3, Cranial nerves grossly intact, moves all 4 extremities.  Skin: Warm and dry.   Intake/Output from previous day: 06/13 0701 - 06/14 0700 In: 978.1 [P.O.:804; I.V.:174.1] Out: 300 [Urine:300]    Lab Results: BMET    Component Value Date/Time   NA 137 08/20/2018 0735   NA 138 08/19/2018 0304   NA 142 08/18/2018 1630   K 3.9 08/20/2018 0735   K 4.1 08/19/2018 0304   K 3.2 (L) 08/18/2018 1630   CL 107 08/20/2018 0735   CL 108 08/19/2018 0304   CL 112 (H) 08/18/2018 1630   CO2 16 (L) 08/20/2018 0735   CO2 17 (L) 08/19/2018 0304   CO2 19 (L) 08/18/2018 1630   GLUCOSE 216 (H) 08/20/2018 0735   GLUCOSE 174 (H) 08/19/2018 0304   GLUCOSE 130 (H) 08/18/2018 1630   BUN 33 (H) 08/20/2018 0735   BUN 29 (H) 08/19/2018 0304   BUN 23 08/18/2018 1630   CREATININE 3.21 (H) 08/20/2018 0735   CREATININE 2.81 (H) 08/19/2018 0304   CREATININE 2.68 (H) 08/18/2018 1630   CALCIUM 8.6 (L) 08/20/2018 0735   CALCIUM 8.4 (L) 08/19/2018 0304   CALCIUM 8.4 (L) 08/18/2018 1630   GFRNONAA 19 (L) 08/20/2018 0735   GFRNONAA 22 (L) 08/19/2018 0304   GFRNONAA 23 (L) 08/18/2018 1630   GFRAA 22 (L) 08/20/2018 0735   GFRAA 26 (L) 08/19/2018 0304   GFRAA 27 (L) 08/18/2018 1630   CBC    Component Value Date/Time   WBC 13.3 (H) 08/20/2018 0735   RBC 3.08 (L) 08/20/2018 0735   HGB 8.8 (L) 08/20/2018 0735   HCT 29.9 (L) 08/20/2018 0735   PLT 192 08/20/2018 0735   MCV 97.1 08/20/2018 0735   MCH 28.6 08/20/2018 0735   MCHC 29.4 (L) 08/20/2018 0735   RDW 13.4 08/20/2018 0735   LYMPHSABS 1.7 10/20/2012 0510   MONOABS 0.8 10/20/2012 0510   EOSABS 0.1 10/20/2012 0510   BASOSABS 0.0 10/20/2012 0510   HEPATIC Function Panel Recent Labs    08/18/18 1630  PROT 6.1*   HEMOGLOBIN A1C No components found for: HGA1C,  MPG CARDIAC ENZYMES Lab Results  Component Value Date   CKTOTAL 35 09/10/2012   TROPONINI 25.53 (HH) 08/20/2018   TROPONINI 30.75 (HH) 08/20/2018   TROPONINI 35.22 (HH) 08/19/2018   BNP No results for input(s): PROBNP in the  last 8760 hours. TSH No results for input(s): TSH in the last 8760 hours. CHOLESTEROL No results for input(s): CHOL in the last 8760 hours.  Scheduled Meds: . aspirin EC  81 mg Oral q morning - 10a  . atorvastatin  10 mg Oral q morning - 10a  . insulin aspart  0-9 Units Subcutaneous TID WC  . metoprolol tartrate  2.5 mg Intravenous Q6H  . multivitamin with minerals  1 tablet Oral q morning - 10a  . tamsulosin  0.4 mg Oral q morning - 10a   Continuous Infusions: . sodium chloride 50 mL/hr at 08/19/18 1702  . cefTRIAXone (ROCEPHIN)  IV 1 g (08/19/18 1959)  . heparin 950 Units/hr (08/19/18 1703)   PRN Meds:.acetaminophen **OR** acetaminophen, camphor-menthol, nitroGLYCERIN, ondansetron **OR** ondansetron (ZOFRAN) IV  Assessment/Plan: Acute  inferior wall MI Acute renal failure CAD S/P CABG Type 2 DM HTN UTI PVD Anemia of chronic disease  Not an ideal candidate for cardiac cath with possible angioplasty due to high risk. Dr. Terrence Dupont to follow from tomorrow.   LOS: 2 days    Dixie Dials  MD  08/20/2018, 9:41 AM

## 2018-08-20 NOTE — Progress Notes (Signed)
[place] for heparin Indication: chest pain/ACS, possible aflutter  Heparin Dosing Weight: 81.6 kg  Labs: Recent Labs    08/18/18 1630  08/18/18 2136 08/19/18 0304  08/19/18 1536 08/20/18 0003 08/20/18 0735 08/20/18 1549  HGB  --    < > 8.5* 9.1*  --   --   --  8.8*  --   HCT  --   --  27.5* 29.0*  --   --   --  29.9*  --   PLT  --   --  280 205  --   --   --  192  --   HEPARINUNFRC  --   --   --   --    < > 0.85*  --  0.42 0.31  CREATININE 2.68*  --   --  2.81*  --   --   --  3.21*  --   TROPONINI 0.16*  --  4.47* 17.30*   < > 35.22* 30.75* 25.53*  --    < > = values in this interval not displayed.    Assessment: 65 yom presenting with CP, possible aflutter. Not on anticoagulation PTA. Noted bilateral BKA. Scr up to 2.81, troponin 17.3. Pharmacy consulted to dose heparin.   Confirmatory heparin level remains therapeutic, cardiac cath planned for tomorrow. Will increase infusion rate slightly to prevent subtherapeutic drop.  Goal of Therapy:  Heparin level 0.3-0.7 units/ml Monitor platelets by anticoagulation protocol: Yes   Plan:  Increase heparin slightly to 1000 units/hr Daily heparin level and CBC   Arrie Senate, PharmD, BCPS Clinical Pharmacist [phone] Please check AMION for all [place] numbers 08/20/2018

## 2018-08-20 NOTE — Progress Notes (Signed)
Pt had 7 beats VT per CCMD.  Patient asymptomatic. On call Raquel Sarna, NP made aware.  No new orders. Will continue to monitor patient.

## 2018-08-20 NOTE — Progress Notes (Signed)
Pt tolerated PO fine today.  Still low appetite. States he feels much better than yesterday even though he is having low grade temp.  Idolina Primer, RN

## 2018-08-20 NOTE — Progress Notes (Signed)
[place] for heparin Indication: chest pain/ACS, possible aflutter  Heparin Dosing Weight: 81.6 kg  Labs: Recent Labs    08/18/18 1630  08/18/18 2136 08/19/18 0304 08/19/18 0623  08/19/18 1536 08/20/18 0003 08/20/18 0735  HGB  --    < > 8.5* 9.1*  --   --   --   --  8.8*  HCT  --   --  27.5* 29.0*  --   --   --   --  29.9*  PLT  --   --  280 205  --   --   --   --  192  HEPARINUNFRC  --   --   --   --  0.67  --  0.85*  --  0.42  CREATININE 2.68*  --   --  2.81*  --   --   --   --  3.21*  TROPONINI 0.16*  --  4.47* 17.30*  --    < > 35.22* 30.75* 25.53*   < > = values in this interval not displayed.    Assessment: 48 yom presenting with CP, possible aflutter. Not on anticoagulation PTA. Noted bilateral BKA. Scr up to 2.81, troponin 17.3. Pharmacy consulted to dose heparin.   Heparin level therapeutic this AM. CBC slightly down, Hgb 9.1 > 8.8, pltc 205 > 192. No bleeding issues documented.  Goal of Therapy:  Heparin level 0.3-0.7 units/ml Monitor platelets by anticoagulation protocol: Yes   Plan:  Continue heparin at 950 units/hr Check confirmatory heparin level in 8 hours Check daily heparin level and CBC Monitor for s/sx of bleeding  Thank you for allowing pharmacy to be a part of this patient's care.  Leron Croak, PharmD PGY1 Pharmacy Resident Phone: [phone]  Please check AMION for all [facility] Pharmacy phone numbers 08/20/2018

## 2018-08-20 NOTE — Progress Notes (Signed)
Pt had a low grade temp of 100.3 orally.  Idolina Primer, RN

## 2018-08-21 LAB — GLUCOSE, CAPILLARY
Glucose-Capillary: 173 mg/dL — ABNORMAL HIGH (ref 70–99)
Glucose-Capillary: 158 mg/dL — ABNORMAL HIGH (ref 70–99)
Glucose-Capillary: 149 mg/dL — ABNORMAL HIGH (ref 70–99)
Glucose-Capillary: 173 mg/dL — ABNORMAL HIGH (ref 70–99)
Glucose-Capillary: 138 mg/dL — ABNORMAL HIGH (ref 70–99)

## 2018-08-21 LAB — BASIC METABOLIC PANEL
Anion gap: 11 (ref 5–15)
Anion gap: 9 (ref 5–15)
BUN: 37 mg/dL — ABNORMAL HIGH (ref 8–23)
BUN: 36 mg/dL — ABNORMAL HIGH (ref 8–23)
CO2: 18 mmol/L — ABNORMAL LOW (ref 22–32)
CO2: 19 mmol/L — ABNORMAL LOW (ref 22–32)
Calcium: 8.5 mg/dL — ABNORMAL LOW (ref 8.9–10.3)
Calcium: 8.7 mg/dL — ABNORMAL LOW (ref 8.9–10.3)
Chloride: 108 mmol/L (ref 98–111)
Chloride: 110 mmol/L (ref 98–111)
Creatinine, Ser: 3.17 mg/dL — ABNORMAL HIGH (ref 0.61–1.24)
Creatinine, Ser: 3.04 mg/dL — ABNORMAL HIGH (ref 0.61–1.24)
GFR calc Af Amer: 22 mL/min — ABNORMAL LOW (ref >60)
GFR calc Af Amer: 23 mL/min — ABNORMAL LOW (ref >60)
GFR calc non Af Amer: 19 mL/min — ABNORMAL LOW (ref >60)
GFR calc non Af Amer: 20 mL/min — ABNORMAL LOW (ref >60)
Glucose, Bld: 196 mg/dL — ABNORMAL HIGH (ref 70–99)
Glucose, Bld: 156 mg/dL — ABNORMAL HIGH (ref 70–99)
Potassium: 4.1 mmol/L (ref 3.5–5.1)
Potassium: 4.2 mmol/L (ref 3.5–5.1)
Sodium: 137 mmol/L (ref 135–145)
Sodium: 138 mmol/L (ref 135–145)

## 2018-08-21 LAB — CBC
HCT: 26.2 % — ABNORMAL LOW (ref 39.0–52.0)
Hemoglobin: 8.2 g/dL — ABNORMAL LOW (ref 13.0–17.0)
MCH: 28.8 pg (ref 26.0–34.0)
MCHC: 31.3 g/dL (ref 30.0–36.0)
MCV: 91.9 fL (ref 80.0–100.0)
Platelets: 156 10*3/uL (ref 150–400)
RBC: 2.85 MIL/uL — ABNORMAL LOW (ref 4.22–5.81)
RDW: 13.1 % (ref 11.5–15.5)
WBC: 11.3 10*3/uL — ABNORMAL HIGH (ref 4.0–10.5)
nRBC: 0.2 % (ref 0.0–0.2)

## 2018-08-21 LAB — URINE CULTURE: Culture: >=100000 — AB

## 2018-08-21 LAB — HEPARIN LEVEL (UNFRACTIONATED)
Heparin Unfractionated: 0.23 IU/mL — ABNORMAL LOW (ref 0.30–0.70)
Heparin Unfractionated: 0.27 IU/mL — ABNORMAL LOW (ref 0.30–0.70)

## 2018-08-21 MED ORDER — PANTOPRAZOLE SODIUM 40 MG PO TBEC
40.0000 mg | DELAYED_RELEASE_TABLET | Freq: Every day | ORAL | Status: DC
  Administered 2018-08-22 – 2018-08-23 (×2): 40 mg via ORAL
  Filled 2018-08-21 (×2): qty 1

## 2018-08-21 MED ORDER — ATORVASTATIN CALCIUM 40 MG PO TABS
40.0000 mg | ORAL_TABLET | Freq: Every morning | ORAL | Status: DC
  Administered 2018-08-22 – 2018-08-23 (×2): 40 mg via ORAL
  Filled 2018-08-21 (×2): qty 1

## 2018-08-21 MED ORDER — METOPROLOL SUCCINATE ER 50 MG PO TB24
50.0000 mg | ORAL_TABLET | Freq: Every morning | ORAL | Status: DC
  Administered 2018-08-21 – 2018-08-23 (×3): 50 mg via ORAL
  Filled 2018-08-21 (×3): qty 1

## 2018-08-21 MED ORDER — AMLODIPINE BESYLATE 2.5 MG PO TABS
2.5000 mg | ORAL_TABLET | Freq: Every day | ORAL | Status: DC
  Administered 2018-08-21 – 2018-08-23 (×3): 2.5 mg via ORAL
  Filled 2018-08-21 (×3): qty 1

## 2018-08-21 NOTE — Progress Notes (Addendum)
[place] for heparin Indication: chest pain/ACS, possible aflutter  Heparin Dosing Weight: 81.6 kg  Labs: Recent Labs    08/19/18 0304  08/19/18 1536 08/20/18 0003 08/20/18 0735 08/20/18 1549 08/21/18 0442  HGB 9.1*  --   --   --  8.8*  --  8.2*  HCT 29.0*  --   --   --  29.9*  --  26.2*  PLT 205  --   --   --  192  --  156  HEPARINUNFRC  --    < > 0.85*  --  0.42 0.31 0.23*  CREATININE 2.81*  --   --   --  3.21*  --  3.17*  TROPONINI 17.30*   < > 35.22* 30.75* 25.53*  --   --    < > = values in this interval not displayed.    Assessment: 85 yom presenting with CP, possible aflutter. Not on anticoagulation PTA. Noted bilateral BKA. Scr up to 2.81, troponin 17.3. Pharmacy consulted to dose heparin.   Heparin level this morning came back subtherapeutic at 0.23, on 1000 units/hr. Hgb 8.2, plt 156. No s/sx of bleeding. No infusion issues.   Goal of Therapy:  Heparin level 0.3-0.7 units/ml Monitor platelets by anticoagulation protocol: Yes   Plan:  Increase heparin infusion to 1100 units/hr Daily heparin level and CBC F/u if plan for cardiac cath or consider stop date if indication ACS/CP  Antonietta Jewel, PharmD, BCCCP Clinical Pharmacist  Pager: [phone] Phone: [phone] Please check AMION for all [place] numbers 08/21/2018

## 2018-08-21 NOTE — Progress Notes (Signed)
[place] for heparin Indication: chest pain/ACS, possible aflutter  Heparin Dosing Weight: 81.6 kg  Labs: Recent Labs    08/19/18 0304  08/19/18 1536 08/20/18 0003 08/20/18 0735 08/20/18 1549 08/21/18 0442 08/21/18 1240 08/21/18 1722  HGB 9.1*  --   --   --  8.8*  --  8.2*  --   --   HCT 29.0*  --   --   --  29.9*  --  26.2*  --   --   PLT 205  --   --   --  192  --  156  --   --   HEPARINUNFRC  --    < > 0.85*  --  0.42 0.31 0.23*  --  0.27*  CREATININE 2.81*  --   --   --  3.21*  --  3.17* 3.04*  --   TROPONINI 17.30*   < > 35.22* 30.75* 25.53*  --   --   --   --    < > = values in this interval not displayed.    Assessment: 22 yom presenting with CP, possible aflutter. Not on anticoagulation PTA. Noted bilateral BKA. Scr up to 2.81, troponin 17.3. Pharmacy consulted to dose heparin.   PM heparin level = 0.27  Goal of Therapy:  Heparin level 0.3-0.7 units/ml Monitor platelets by anticoagulation protocol: Yes   Plan:  Increase heparin infusion to 12500 units/hr Daily heparin level and CBC F/u if plan for cardiac cath or consider stop date if indication ACS/CP  Thank you Anette Guarneri, PharmD Please check AMION for all [place] numbers 08/21/2018

## 2018-08-21 NOTE — Progress Notes (Signed)
Subjective:  Doing well denies any chest pain or shortness of breath no further nausea vomiting or hematemesis.  Denies any urinary complaints   Objective:  Vital Signs in the last 24 hours: Temp:  [100.3 F (37.9 C)] 100.3 F (37.9 C) (06/14 1612) Pulse Rate:  [101-112] 101 (06/15 0954) Resp:  [26-27] 26 (06/14 2048) BP: (137-158)/(85-98) 158/85 (06/15 0954) SpO2:  [97 %-99 %] 97 % (06/14 2048)  Intake/Output from previous day: 06/14 0701 - 06/15 0700 In: 240 [P.O.:240] Out: 250 [Urine:250] Intake/Output from this shift: Total I/O In: -  Out: 250 [Urine:250]  Physical Exam: Neck: no adenopathy, no carotid bruit, no JVD and supple, symmetrical, trachea midline Lungs: clear to auscultation bilaterally Heart: regular rate and rhythm, S1, S2 normal and Soft systolic murmur noted Abdomen: soft, non-tender; bowel sounds normal; no masses,  no organomegaly Extremities: Bilateral below-knee amputation noted  Lab Results: Recent Labs    08/20/18 0735 08/21/18 0442  WBC 13.3* 11.3*  HGB 8.8* 8.2*  PLT 192 156   Recent Labs    08/20/18 0735 08/21/18 0442  NA 137 137  K 3.9 4.1  CL 107 108  CO2 16* 18*  GLUCOSE 216* 196*  BUN 33* 37*  CREATININE 3.21* 3.17*   Recent Labs    08/20/18 0003 08/20/18 0735  TROPONINI 30.75* 25.53*   Hepatic Function Panel Recent Labs    08/18/18 1630  PROT 6.1*  ALBUMIN 3.2*  AST 20  ALT 12  ALKPHOS 62  BILITOT 0.8  BILIDIR 0.2  IBILI 0.6   No results for input(s): CHOL in the last 72 hours. No results for input(s): PROTIME in the last 72 hours.  Imaging: Imaging results have been reviewed and US Renal  Result Date: 08/20/2018 CLINICAL DATA:  Acute kidney injury. EXAM: RENAL / URINARY TRACT ULTRASOUND COMPLETE COMPARISON:  CT abdomen and pelvis 10/11/2012 FINDINGS: Right Kidney: Renal measurements: 9.8 x 4.7 x 5.7 cm = volume: 137 mL. Increased parenchymal echogenicity. Small cysts measuring 16 mm and 7 mm. No  hydronephrosis. Left Kidney: Renal measurements: 8.3 x 4.0 x 4.5 cm = volume: 90 mL. Increased parenchymal echogenicity. No mass or hydronephrosis. Bladder: Mild bladder wall thickening, possibly with a small amount of layering debris posteriorly. Heterogeneous appearance of the prostate with small calcifications and a 1.5 cm cystic region, query prior TURP. IMPRESSION: 1. Echogenic kidneys compatible with medical renal disease.  Note 2. No hydronephrosis. 3. Mild bladder wall thickening and possible debris in the bladder. Electronically Signed   By: Logan Bores M.D.   On: 08/20/2018 12:54    Cardiac Studies:  Assessment/Plan:  Acute inferior wall MI Coronary artery disease status post CABG x4 in 2008 hypertension Diabetes mellitus Acute on chronic kidney disease stage IV Anemia of chronic disease Hyperlipidemi Resolving UTI Status post hematemesis Peripheral vascular disease status post Plan Discussed with patient regarding his EKG changes and elevated cardiac enzymes suggestive of MI and various options of treatment i.e. medical versus invasive left cath possible PCI staging the procedure and its risk and benefits i.e. death MI stroke need for emergency CABG risk of worsening renal function requiring hemodialysis for local vascular complications etc. and wanted to be treated medically only states her mother was on dialysis and did not do well and absolutely does not want hemodialysis. Check labs and EKG in a.m. Increase statin as per orders Add low-dose amlodipine    LOS: 3 days    Charolette Forward 08/21/2018, 11:49 AM

## 2018-08-21 NOTE — Care Management Important Message (Signed)
Important Message  Patient Details  Name: [name] MRN: [id number] Date of Birth: Mar 09, 1950   Medicare Important Message Given:  Yes    Shelda Altes 08/21/2018, 12:14 PM

## 2018-08-21 NOTE — Progress Notes (Signed)
PROGRESS NOTE    [name]  SVX:[id number] DOB: [birth year] DOA: 08/18/2018 PCP: Boneta Lucks, MD   Brief Narrative: [name] is a 68 y.o. 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 Patient presented to the hospital secondary to chest pain and found to have an NSTEMI. Also evidence of UTI. Started on heparin and ceftriaxone. Cardiology consulted.   Assessment & Plan:   Principal Problem:   Sepsis secondary to UTI [facility]) Active Problems:   S/P CABG x 4   Diabetes mellitus type 2, uncontrolled, with complications ([place])   Chronic systolic heart failure (HCC)   NSTEMI (non-ST elevated myocardial infarction) ([place])   Renal insufficiency   Sepsis Present on admission. Leukocytosis and fever. Secondary to UTI. Empirically treating with ceftriaxone. Blood and urine cultures pending.  UTI Urine culture significant for >100k Klebsiella Pneumoniae. On empiric ceftriaxone. Blood cultures with no growth -Continue ceftriaxone  NSTEMI Patient with significant CAD history. S/p CABG x4. Currently declining cath secondary to risk of kidney failure from contrast. High risk patient. Heparin drip started. Cardiology on board. For now, plan is for medical management. Troponin trending down. Cardiology recommendations for heparin drip.  AKI on CKD III Baseline creatinine of 1.4-1.5 but from 6 years ago. Creatinine of 2.68 on admission and continues to trend up. Renal ultrasound significant for no hydronephrosis and evidence of medical renal disease -Strict in and out/daily weights -BMP this afternoon and tomorrow morning  Chronic systolic heart failure EF of 35-40%. Euvolemic. -Cardiology recommendations  Diabetes mellitus, type 2 Listed on history. Not on any medications as an outpatient. Hemoglobin A1C of 5.6%. -Continue SSI  Atrial flutter Per chart review. On metoprolol. -Continue metoprolol    DVT prophylaxis: Heparin drip Code Status:   Code Status: Full Code Family Communication: None Disposition Plan: Pending continued cardiac management and treatment of likely infection   Consultants:   Cardiology  Procedures:   08/19/2018: Transthoracic Echocardiogram IMPRESSIONS    1. Stage 1: stage1: Multiple segmental abnormalities exist. See findings.  2. The left ventricle has moderately reduced systolic function, with an ejection fraction of 35-40%. The cavity size was normal. There is mild concentric left ventricular hypertrophy. Diastolic dysfunction, grade indeterminate. Left ventrical global  hypokinesis without regional wall motion abnormalities.  3. Left atrial size was mildly dilated.  4. The mitral valve is grossly normal. Mild thickening of the mitral valve leaflet. Mitral valve regurgitation is moderate by color flow Doppler.  5. The tricuspid valve is grossly normal.  6. The aortic valve is grossly normal. Mild thickening of the aortic valve.  7. Mild aortic annular calcification.  8. The aortic root is normal in size and structure.  9. The interatrial septum was not well visualized.  Antimicrobials:  Ceftriaxone    Subjective: No chest pain. Eager to go home when able.  Objective: Vitals:   08/20/18 1411 08/20/18 1612 08/20/18 2048 08/21/18 0954  BP: (!) 154/94  (!) 137/98 (!) 158/85  Pulse: (!) 112  (!) 108 (!) 101  Resp: (!) 27  (!) 26   Temp: 100.3 F (37.9 C) 100.3 F (37.9 C)    TempSrc: Oral Oral    SpO2: 99%  97%   Weight:      Height:        Intake/Output Summary (Last 24 hours) at 08/21/2018 1044 Last data filed at 08/21/2018 0534 Gross per 24 hour  Intake -  Output  250 ml  Net -250 ml   Filed Weights   08/18/18 1620 08/19/18 0117 08/20/18 0524  Weight: 81.6 kg 81.6 kg 81.6 kg    Examination:  General exam: Appears calm and comfortable Respiratory system: Clear to auscultation. Respiratory effort normal. Cardiovascular  system: S1 & S2 heard, RRR. No murmurs, rubs, gallops or clicks. Gastrointestinal system: Abdomen is nondistended, soft and nontender. No organomegaly or masses felt. Normal bowel sounds heard. Central nervous system: Alert and oriented. No focal neurological deficits. Extremities: No edema. Bilateral BKA Skin: No cyanosis. No rashes Psychiatry: Judgement and insight appear normal. Mood & affect appropriate.     Data Reviewed: I have personally reviewed following labs and imaging studies  CBC: Recent Labs  Lab 08/18/18 2136 08/19/18 0304 08/20/18 0735 08/21/18 0442  WBC 18.6* 16.4* 13.3* 11.3*  HGB 8.5* 9.1* 8.8* 8.2*  HCT 27.5* 29.0* 29.9* 26.2*  MCV 94.2 91.8 97.1 91.9  PLT 280 205 192 914   Basic Metabolic Panel: Recent Labs  Lab 08/18/18 1630 08/19/18 0304 08/20/18 0735 08/21/18 0442  NA 142 138 137 137  K 3.2* 4.1 3.9 4.1  CL 112* 108 107 108  CO2 19* 17* 16* 18*  GLUCOSE 130* 174* 216* 196*  BUN 23 29* 33* 37*  CREATININE 2.68* 2.81* 3.21* 3.17*  CALCIUM 8.4* 8.4* 8.6* 8.5*  MG 1.7  --   --   --    GFR: Estimated Creatinine Clearance: 22.7 mL/min (A) (by C-G formula based on SCr of 3.17 mg/dL (H)). Liver Function Tests: Recent Labs  Lab 08/18/18 1630  AST 20  ALT 12  ALKPHOS 62  BILITOT 0.8  PROT 6.1*  ALBUMIN 3.2*   No results for input(s): LIPASE, AMYLASE in the last 168 hours. No results for input(s): AMMONIA in the last 168 hours. Coagulation Profile: No results for input(s): INR, PROTIME in the last 168 hours. Cardiac Enzymes: Recent Labs  Lab 08/19/18 0304 08/19/18 1142 08/19/18 1536 08/20/18 0003 08/20/18 0735  TROPONINI 17.30* 31.83* 35.22* 30.75* 25.53*   BNP (last 3 results) No results for input(s): PROBNP in the last 8760 hours. HbA1C: Recent Labs    08/19/18 0304  HGBA1C 5.6   CBG: Recent Labs  Lab 08/20/18 1113 08/20/18 1611 08/20/18 2048 08/21/18 0532 08/21/18 0725  GLUCAP 266* 197* 179* 173* 158*   Lipid  Profile: No results for input(s): CHOL, HDL, LDLCALC, TRIG, CHOLHDL, LDLDIRECT in the last 72 hours. Thyroid Function Tests: No results for input(s): TSH, T4TOTAL, FREET4, T3FREE, THYROIDAB in the last 72 hours. Anemia Panel: No results for input(s): VITAMINB12, FOLATE, FERRITIN, TIBC, IRON, RETICCTPCT in the last 72 hours. Sepsis Labs: Recent Labs  Lab 08/18/18 1633 08/18/18 1833  LATICACIDVEN 2.0* 1.5    Recent Results (from the past 240 hour(s))  Urine culture     Status: Abnormal   Collection Time: 08/18/18  4:32 PM   Specimen: Urine, Random  Result Value Ref Range Status   Specimen Description URINE, RANDOM  Final   Special Requests   Final    NONE Performed at [facility], [place] [address] [place], [place] [address]    Culture >=100,000 COLONIES/mL KLEBSIELLA PNEUMONIAE (A)  Final   Report Status 08/21/2018 FINAL  Final   Organism ID, Bacteria KLEBSIELLA PNEUMONIAE (A)  Final      Susceptibility   Klebsiella pneumoniae - MIC*    AMPICILLIN >=32 RESISTANT Resistant     CEFAZOLIN <=4 SENSITIVE Sensitive     CEFTRIAXONE <=1 SENSITIVE Sensitive  CIPROFLOXACIN <=0.25 SENSITIVE Sensitive     GENTAMICIN <=1 SENSITIVE Sensitive     IMIPENEM <=0.25 SENSITIVE Sensitive     NITROFURANTOIN 64 INTERMEDIATE Intermediate     TRIMETH/SULFA <=20 SENSITIVE Sensitive     AMPICILLIN/SULBACTAM 4 SENSITIVE Sensitive     PIP/TAZO <=4 SENSITIVE Sensitive     Extended ESBL NEGATIVE Sensitive     * >=100,000 COLONIES/mL KLEBSIELLA PNEUMONIAE  SARS Coronavirus 2 (CEPHEID- Performed in [place] hospital lab), Hosp Order     Status: None   Collection Time: 08/18/18  5:04 PM   Specimen: Urine, Clean Catch; Nasopharyngeal  Result Value Ref Range Status   SARS Coronavirus 2 NEGATIVE NEGATIVE Final    Comment: (NOTE) If result is NEGATIVE SARS-CoV-2 target nucleic acids are NOT DETECTED. The SARS-CoV-2 RNA is generally detectable in upper and lower  respiratory specimens during the  acute phase of infection. The lowest  concentration of SARS-CoV-2 viral copies this assay can detect is 250  copies / mL. A negative result does not preclude SARS-CoV-2 infection  and should not be used as the sole basis for treatment or other  patient management decisions.  A negative result may occur with  improper specimen collection / handling, submission of specimen other  than nasopharyngeal swab, presence of viral mutation(s) within the  areas targeted by this assay, and inadequate number of viral copies  (<250 copies / mL). A negative result must be combined with clinical  observations, patient history, and epidemiological information. If result is POSITIVE SARS-CoV-2 target nucleic acids are DETECTED. The SARS-CoV-2 RNA is generally detectable in upper and lower  respiratory specimens dur ing the acute phase of infection.  Positive  results are indicative of active infection with SARS-CoV-2.  Clinical  correlation with patient history and other diagnostic information is  necessary to determine patient infection status.  Positive results do  not rule out bacterial infection or co-infection with other viruses. If result is PRESUMPTIVE POSTIVE SARS-CoV-2 nucleic acids MAY BE PRESENT.   A presumptive positive result was obtained on the submitted specimen  and confirmed on repeat testing.  While 2019 novel coronavirus  (SARS-CoV-2) nucleic acids may be present in the submitted sample  additional confirmatory testing may be necessary for epidemiological  and / or clinical management purposes  to differentiate between  SARS-CoV-2 and other Sarbecovirus currently known to infect humans.  If clinically indicated additional testing with an alternate test  methodology [phone]) is advised. The SARS-CoV-2 RNA is generally  detectable in upper and lower respiratory sp ecimens during the acute  phase of infection. The expected result is Negative. Fact Sheet for Patients:   StrictlyIdeas.no Fact Sheet for Healthcare Providers: BankingDealers.co.za This test is not yet approved or cleared by the [place] FDA and has been authorized for detection and/or diagnosis of SARS-CoV-2 by FDA under an Emergency Use Authorization (EUA).  This EUA will remain in effect (meaning this test can be used) for the duration of the COVID-19 declaration under Section 564(b)(1) of the Act, 21 U.S.C. section 360bbb-3(b)(1), unless the authorization is terminated or revoked sooner. Performed at [facility], [place] [address], [place], [place] [address]   Blood culture (routine x 2)     Status: None (Preliminary result)   Collection Time: 08/18/18  5:10 PM   Specimen: BLOOD  Result Value Ref Range Status   Specimen Description BLOOD RIGHT ANTECUBITAL  Final   Special Requests   Final    BOTTLES DRAWN AEROBIC AND ANAEROBIC Blood  Culture results may not be optimal due to an inadequate volume of blood received in culture bottles   Culture   Final    NO GROWTH 2 DAYS Performed at [facility], [place] [address] [place], [place] [address]    Report Status PENDING  Incomplete  Blood culture (routine x 2)     Status: None (Preliminary result)   Collection Time: 08/18/18  5:11 PM   Specimen: BLOOD  Result Value Ref Range Status   Specimen Description BLOOD BLOOD LEFT FOREARM  Final   Special Requests   Final    BOTTLES DRAWN AEROBIC AND ANAEROBIC Blood Culture results may not be optimal due to an inadequate volume of blood received in culture bottles   Culture   Final    NO GROWTH 2 DAYS Performed at [facility], [place] [name], [place], [place] [address]    Report Status PENDING  Incomplete         Radiology Studies: US Renal  Result Date: 08/20/2018 CLINICAL DATA:  Acute kidney injury. EXAM: RENAL / URINARY TRACT ULTRASOUND COMPLETE COMPARISON:  CT abdomen and pelvis 10/11/2012 FINDINGS: Right Kidney: Renal  measurements: 9.8 x 4.7 x 5.7 cm = volume: 137 mL. Increased parenchymal echogenicity. Small cysts measuring 16 mm and 7 mm. No hydronephrosis. Left Kidney: Renal measurements: 8.3 x 4.0 x 4.5 cm = volume: 90 mL. Increased parenchymal echogenicity. No mass or hydronephrosis. Bladder: Mild bladder wall thickening, possibly with a small amount of layering debris posteriorly. Heterogeneous appearance of the prostate with small calcifications and a 1.5 cm cystic region, query prior TURP. IMPRESSION: 1. Echogenic kidneys compatible with medical renal disease.  Note 2. No hydronephrosis. 3. Mild bladder wall thickening and possible debris in the bladder. Electronically Signed   By: Logan Bores M.D.   On: 08/20/2018 12:54        Scheduled Meds: . aspirin EC  81 mg Oral q morning - 10a  . atorvastatin  10 mg Oral q morning - 10a  . insulin aspart  0-9 Units Subcutaneous TID WC  . metoprolol succinate  50 mg Oral q morning - 10a  . multivitamin with minerals  1 tablet Oral q morning - 10a  . tamsulosin  0.4 mg Oral q morning - 10a   Continuous Infusions: . sodium chloride 50 mL/hr at 08/21/18 1002  . cefTRIAXone (ROCEPHIN)  IV 1 g (08/20/18 2050)  . heparin 1,100 Units/hr (08/21/18 0846)     LOS: 3 days     Cordelia Poche, MD [facility] 08/21/2018, 10:44 AM  If 7PM-7AM, please contact night-coverage [email or web address]

## 2018-08-22 DIAGNOSIS — E1165 Type 2 diabetes mellitus with hyperglycemia: Secondary | ICD-10-CM

## 2018-08-22 DIAGNOSIS — E118 Type 2 diabetes mellitus with unspecified complications: Secondary | ICD-10-CM

## 2018-08-22 LAB — CBC
HCT: 24.6 % — ABNORMAL LOW (ref 39.0–52.0)
Hemoglobin: 7.7 g/dL — ABNORMAL LOW (ref 13.0–17.0)
MCH: 28.1 pg (ref 26.0–34.0)
MCHC: 31.3 g/dL (ref 30.0–36.0)
MCV: 89.8 fL (ref 80.0–100.0)
Platelets: 177 10*3/uL (ref 150–400)
RBC: 2.74 MIL/uL — ABNORMAL LOW (ref 4.22–5.81)
RDW: 13.3 % (ref 11.5–15.5)
WBC: 9.5 10*3/uL (ref 4.0–10.5)
nRBC: 0 % (ref 0.0–0.2)

## 2018-08-22 LAB — GLUCOSE, CAPILLARY
Glucose-Capillary: 136 mg/dL — ABNORMAL HIGH (ref 70–99)
Glucose-Capillary: 150 mg/dL — ABNORMAL HIGH (ref 70–99)
Glucose-Capillary: 194 mg/dL — ABNORMAL HIGH (ref 70–99)
Glucose-Capillary: 156 mg/dL — ABNORMAL HIGH (ref 70–99)
Glucose-Capillary: 120 mg/dL — ABNORMAL HIGH (ref 70–99)

## 2018-08-22 LAB — BASIC METABOLIC PANEL
Anion gap: 8 (ref 5–15)
BUN: 35 mg/dL — ABNORMAL HIGH (ref 8–23)
CO2: 18 mmol/L — ABNORMAL LOW (ref 22–32)
Calcium: 8.5 mg/dL — ABNORMAL LOW (ref 8.9–10.3)
Chloride: 110 mmol/L (ref 98–111)
Creatinine, Ser: 2.97 mg/dL — ABNORMAL HIGH (ref 0.61–1.24)
GFR calc Af Amer: 24 mL/min — ABNORMAL LOW (ref >60)
GFR calc non Af Amer: 21 mL/min — ABNORMAL LOW (ref >60)
Glucose, Bld: 146 mg/dL — ABNORMAL HIGH (ref 70–99)
Potassium: 4 mmol/L (ref 3.5–5.1)
Sodium: 136 mmol/L (ref 135–145)

## 2018-08-22 LAB — TROPONIN I: Troponin I: 29.16 ng/mL (ref <0.03)

## 2018-08-22 LAB — HEPARIN LEVEL (UNFRACTIONATED): Heparin Unfractionated: 0.44 IU/mL (ref 0.30–0.70)

## 2018-08-22 MED ORDER — HEPARIN SODIUM (PORCINE) 5000 UNIT/ML IJ SOLN
5000.0000 [IU] | Freq: Three times a day (TID) | INTRAMUSCULAR | Status: DC
  Administered 2018-08-22 – 2018-08-23 (×3): 5000 [IU] via SUBCUTANEOUS
  Filled 2018-08-22 (×3): qty 1

## 2018-08-22 NOTE — Progress Notes (Signed)
ANTICOAGULATION CONSULT NOTE - Follow Up Consult  Pharmacy Consult for Heparin Indication: chest pain/ACS  No Known Allergies  Patient Measurements: Height: 5' 9.5" (176.5 cm) Weight: 189 lb 13.1 oz (86.1 kg) IBW/kg (Calculated) : 71.85 Heparin Dosing Weight: 86.1 kg  Vital Signs: Temp: 98.6 F (37 C) (06/16 0538) Temp Source: Oral (06/16 0538) BP: 130/68 (06/16 0843) Pulse Rate: 90 (06/16 0538)  Labs: Recent Labs    08/20/18 0003  08/20/18 0735  08/21/18 0442 08/21/18 1240 08/21/18 1722 08/22/18 0504 08/22/18 1102  HGB  --    < > 8.8*  --  8.2*  --   --  7.7*  --   HCT  --   --  29.9*  --  26.2*  --   --  24.6*  --   PLT  --   --  192  --  156  --   --  177  --   HEPARINUNFRC  --   --  0.42   < > 0.23*  --  0.27*  --  0.44  CREATININE  --    < > 3.21*  --  3.17* 3.04*  --  2.97*  --   TROPONINI 30.75*  --  25.53*  --   --   --   --  29.16*  --    < > = values in this interval not displayed.    Estimated Creatinine Clearance: 24.2 mL/min (A) (by C-G formula based on SCr of 2.97 mg/dL (H)).  Assessment: Anticoag: Hep gtt . Heparin level 0.27 now 0.44 in goal. Hgb 7.7 down today. Plts 177 stable. Pt only wants medical management. - on admit (6/12) pt said he had small amount of blood in vomit. Resolved  Goal of Therapy:  Heparin level 0.3-0.7 units/ml Monitor platelets by anticoagulation protocol: Yes   Plan:  Heparin 1250 units/hr Daily HL and CBC if not discontinued   Panayiota Larkin S. Alford Highland, PharmD, BCPS Clinical Staff Pharmacist Eilene Ghazi [place] 08/22/2018,12:22 PM

## 2018-08-22 NOTE — Progress Notes (Signed)
PROGRESS NOTE    [name]  GNF:[id number] DOB: 03-Aug-1950 DOA: 08/18/2018 PCP: Boneta Lucks, MD   Brief Narrative: [name] is a 68 y.o. 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 Patient presented to the hospital secondary to chest pain and found to have an NSTEMI. Also evidence of UTI. Started on heparin and ceftriaxone. Cardiology consulted.   Assessment & Plan:   Principal Problem:   Sepsis secondary to UTI [facility]) Active Problems:   S/P CABG x 4   Diabetes mellitus type 2, uncontrolled, with complications ([place])   Chronic systolic heart failure (HCC)   NSTEMI (non-ST elevated myocardial infarction) ([place])   Renal insufficiency   Sepsis Present on admission. Leukocytosis and fever. Secondary to UTI. Empirically treating with ceftriaxone. Urine culture as mentioned below.  UTI Urine culture significant for >100k Klebsiella Pneumoniae. On empiric ceftriaxone. Blood cultures with no growth -Continue ceftriaxone  NSTEMI Patient with significant CAD history. S/p CABG x4. Currently declining cath secondary to risk of kidney failure from contrast. High risk patient. Heparin drip started. Cardiology on board. For now, plan is for medical management. Troponin trended down initially, now back up. -Cardiology recommendations: discontinue heparin, repeat troponin, medical managment  AKI on CKD III Baseline creatinine of 1.4-1.5 but from 6 years ago. Creatinine of 2.68 on admission and continues to trend up. Renal ultrasound significant for no hydronephrosis and evidence of medical renal disease -Strict in and out/daily weights  Chronic systolic heart failure EF of 35-40%. Euvolemic. -Cardiology recommendations  Diabetes mellitus, type 2 Listed on history. Not on any medications as an outpatient. Hemoglobin A1C of 5.6%. -Continue SSI  Atrial flutter Per chart review. On metoprolol.  -Continue metoprolol  Anemia Likely chronic disease from kidney disease. No obvious bleeding. Baseline is between 8-8.5. -FOBT -Repeat CBC   DVT prophylaxis: Heparin subq Code Status:   Code Status: Full Code Family Communication: None Disposition Plan: Pending continued cardiac management and treatment of likely infection   Consultants:   Cardiology  Procedures:   08/19/2018: Transthoracic Echocardiogram IMPRESSIONS    1. Stage 1: stage1: Multiple segmental abnormalities exist. See findings.  2. The left ventricle has moderately reduced systolic function, with an ejection fraction of 35-40%. The cavity size was normal. There is mild concentric left ventricular hypertrophy. Diastolic dysfunction, grade indeterminate. Left ventrical global  hypokinesis without regional wall motion abnormalities.  3. Left atrial size was mildly dilated.  4. The mitral valve is grossly normal. Mild thickening of the mitral valve leaflet. Mitral valve regurgitation is moderate by color flow Doppler.  5. The tricuspid valve is grossly normal.  6. The aortic valve is grossly normal. Mild thickening of the aortic valve.  7. Mild aortic annular calcification.  8. The aortic root is normal in size and structure.  9. The interatrial septum was not well visualized.  Antimicrobials:  Ceftriaxone    Subjective: No chest pain, nausea or vomiting.  Objective: Vitals:   08/21/18 0954 08/21/18 2109 08/22/18 0538 08/22/18 0843  BP: (!) 158/85 129/72 (!) 143/78 130/68  Pulse: (!) 101 95 90   Resp:  (!) 24 20   Temp:  98.6 F (37 C) 98.6 F (37 C)   TempSrc:  Oral Oral   SpO2:  99% 96%   Weight:   86.1 kg   Height:        Intake/Output Summary (Last 24 hours) at 08/22/2018 1337 Last data  filed at 08/22/2018 1300 Gross per 24 hour  Intake 1198.6 ml  Output 1375 ml  Net -176.4 ml   Filed Weights   08/19/18 0117 08/20/18 0524 08/22/18 0538  Weight: 81.6 kg 81.6 kg 86.1 kg     Examination:  General exam: Appears calm and comfortable Respiratory system: Clear to auscultation. Respiratory effort normal. Cardiovascular system: S1 & S2 heard, RRR. No murmurs, rubs, gallops or clicks. Gastrointestinal system: Abdomen is nondistended, soft and nontender. No organomegaly or masses felt. Normal bowel sounds heard. Central nervous system: Alert and oriented. No focal neurological deficits. Extremities: B/L BKA Skin: No cyanosis. No rashes Psychiatry: Judgement and insight appear normal. Mood & affect appropriate.     Data Reviewed: I have personally reviewed following labs and imaging studies  CBC: Recent Labs  Lab 08/18/18 2136 08/19/18 0304 08/20/18 0735 08/21/18 0442 08/22/18 0504  WBC 18.6* 16.4* 13.3* 11.3* 9.5  HGB 8.5* 9.1* 8.8* 8.2* 7.7*  HCT 27.5* 29.0* 29.9* 26.2* 24.6*  MCV 94.2 91.8 97.1 91.9 89.8  PLT 280 205 192 156 893   Basic Metabolic Panel: Recent Labs  Lab 08/18/18 1630 08/19/18 0304 08/20/18 0735 08/21/18 0442 08/21/18 1240 08/22/18 0504  NA 142 138 137 137 138 136  K 3.2* 4.1 3.9 4.1 4.2 4.0  CL 112* 108 107 108 110 110  CO2 19* 17* 16* 18* 19* 18*  GLUCOSE 130* 174* 216* 196* 156* 146*  BUN 23 29* 33* 37* 36* 35*  CREATININE 2.68* 2.81* 3.21* 3.17* 3.04* 2.97*  CALCIUM 8.4* 8.4* 8.6* 8.5* 8.7* 8.5*  MG 1.7  --   --   --   --   --    GFR: Estimated Creatinine Clearance: 24.2 mL/min (A) (by C-G formula based on SCr of 2.97 mg/dL (H)). Liver Function Tests: Recent Labs  Lab 08/18/18 1630  AST 20  ALT 12  ALKPHOS 62  BILITOT 0.8  PROT 6.1*  ALBUMIN 3.2*   No results for input(s): LIPASE, AMYLASE in the last 168 hours. No results for input(s): AMMONIA in the last 168 hours. Coagulation Profile: No results for input(s): INR, PROTIME in the last 168 hours. Cardiac Enzymes: Recent Labs  Lab 08/19/18 1142 08/19/18 1536 08/20/18 0003 08/20/18 0735 08/22/18 0504  TROPONINI 31.83* 35.22* 30.75* 25.53* 29.16*    BNP (last 3 results) No results for input(s): PROBNP in the last 8760 hours. HbA1C: No results for input(s): HGBA1C in the last 72 hours. CBG: Recent Labs  Lab 08/21/18 1633 08/21/18 2125 08/22/18 0317 08/22/18 0800 08/22/18 1125  GLUCAP 173* 138* 136* 150* 194*   Lipid Profile: No results for input(s): CHOL, HDL, LDLCALC, TRIG, CHOLHDL, LDLDIRECT in the last 72 hours. Thyroid Function Tests: No results for input(s): TSH, T4TOTAL, FREET4, T3FREE, THYROIDAB in the last 72 hours. Anemia Panel: No results for input(s): VITAMINB12, FOLATE, FERRITIN, TIBC, IRON, RETICCTPCT in the last 72 hours. Sepsis Labs: Recent Labs  Lab 08/18/18 1633 08/18/18 1833  LATICACIDVEN 2.0* 1.5    Recent Results (from the past 240 hour(s))  Urine culture     Status: Abnormal   Collection Time: 08/18/18  4:32 PM   Specimen: Urine, Random  Result Value Ref Range Status   Specimen Description URINE, RANDOM  Final   Special Requests   Final    NONE Performed at [facility], [place] [address], [place], [place] [address]    Culture >=100,000 COLONIES/mL KLEBSIELLA PNEUMONIAE (A)  Final   Report Status 08/21/2018 FINAL  Final  Organism ID, Bacteria KLEBSIELLA PNEUMONIAE (A)  Final      Susceptibility   Klebsiella pneumoniae - MIC*    AMPICILLIN >=32 RESISTANT Resistant     CEFAZOLIN <=4 SENSITIVE Sensitive     CEFTRIAXONE <=1 SENSITIVE Sensitive     CIPROFLOXACIN <=0.25 SENSITIVE Sensitive     GENTAMICIN <=1 SENSITIVE Sensitive     IMIPENEM <=0.25 SENSITIVE Sensitive     NITROFURANTOIN 64 INTERMEDIATE Intermediate     TRIMETH/SULFA <=20 SENSITIVE Sensitive     AMPICILLIN/SULBACTAM 4 SENSITIVE Sensitive     PIP/TAZO <=4 SENSITIVE Sensitive     Extended ESBL NEGATIVE Sensitive     * >=100,000 COLONIES/mL KLEBSIELLA PNEUMONIAE  SARS Coronavirus 2 (CEPHEID- Performed in [place] [facility] lab), Hosp Order     Status: None   Collection Time: 08/18/18  5:04 PM   Specimen: Urine, Clean  Catch; Nasopharyngeal  Result Value Ref Range Status   SARS Coronavirus 2 NEGATIVE NEGATIVE Final    Comment: (NOTE) If result is NEGATIVE SARS-CoV-2 target nucleic acids are NOT DETECTED. The SARS-CoV-2 RNA is generally detectable in upper and lower  respiratory specimens during the acute phase of infection. The lowest  concentration of SARS-CoV-2 viral copies this assay can detect is 250  copies / mL. A negative result does not preclude SARS-CoV-2 infection  and should not be used as the sole basis for treatment or other  patient management decisions.  A negative result may occur with  improper specimen collection / handling, submission of specimen other  than nasopharyngeal swab, presence of viral mutation(s) within the  areas targeted by this assay, and inadequate number of viral copies  (<250 copies / mL). A negative result must be combined with clinical  observations, patient history, and epidemiological information. If result is POSITIVE SARS-CoV-2 target nucleic acids are DETECTED. The SARS-CoV-2 RNA is generally detectable in upper and lower  respiratory specimens dur ing the acute phase of infection.  Positive  results are indicative of active infection with SARS-CoV-2.  Clinical  correlation with patient history and other diagnostic information is  necessary to determine patient infection status.  Positive results do  not rule out bacterial infection or co-infection with other viruses. If result is PRESUMPTIVE POSTIVE SARS-CoV-2 nucleic acids MAY BE PRESENT.   A presumptive positive result was obtained on the submitted specimen  and confirmed on repeat testing.  While 2019 novel coronavirus  (SARS-CoV-2) nucleic acids may be present in the submitted sample  additional confirmatory testing may be necessary for epidemiological  and / or clinical management purposes  to differentiate between  SARS-CoV-2 and other Sarbecovirus currently known to infect humans.  If clinically  indicated additional testing with an alternate test  methodology [phone]) is advised. The SARS-CoV-2 RNA is generally  detectable in upper and lower respiratory sp ecimens during the acute  phase of infection. The expected result is Negative. Fact Sheet for Patients:  StrictlyIdeas.no Fact Sheet for Healthcare Providers: BankingDealers.co.za This test is not yet approved or cleared by the [place] FDA and has been authorized for detection and/or diagnosis of SARS-CoV-2 by FDA under an Emergency Use Authorization (EUA).  This EUA will remain in effect (meaning this test can be used) for the duration of the COVID-19 declaration under Section 564(b)(1) of the Act, 21 U.S.C. section 360bbb-3(b)(1), unless the authorization is terminated or revoked sooner. Performed at [facility], [place] [address] [place], [place] [address]   Blood culture (routine x 2)  Status: None (Preliminary result)   Collection Time: 08/18/18  5:10 PM   Specimen: BLOOD  Result Value Ref Range Status   Specimen Description BLOOD RIGHT ANTECUBITAL  Final   Special Requests   Final    BOTTLES DRAWN AEROBIC AND ANAEROBIC Blood Culture results may not be optimal due to an inadequate volume of blood received in culture bottles   Culture   Final    NO GROWTH 2 DAYS Performed at [facility], [place] [address], [place], [place] [address]    Report Status PENDING  Incomplete  Blood culture (routine x 2)     Status: None (Preliminary result)   Collection Time: 08/18/18  5:11 PM   Specimen: BLOOD  Result Value Ref Range Status   Specimen Description BLOOD BLOOD LEFT FOREARM  Final   Special Requests   Final    BOTTLES DRAWN AEROBIC AND ANAEROBIC Blood Culture results may not be optimal due to an inadequate volume of blood received in culture bottles   Culture   Final    NO GROWTH 2 DAYS Performed at [facility], [place] [address], [place], [place]  [address]    Report Status PENDING  Incomplete         Radiology Studies: No results found.      Scheduled Meds: . amLODipine  2.5 mg Oral Daily  . aspirin EC  81 mg Oral q morning - 10a  . atorvastatin  40 mg Oral q morning - 10a  . heparin injection (subcutaneous)  5,000 Units Subcutaneous Q8H  . insulin aspart  0-9 Units Subcutaneous TID WC  . metoprolol succinate  50 mg Oral q morning - 10a  . multivitamin with minerals  1 tablet Oral q morning - 10a  . pantoprazole  40 mg Oral Q0600  . tamsulosin  0.4 mg Oral q morning - 10a   Continuous Infusions: . sodium chloride Stopped (08/22/18 1257)  . cefTRIAXone (ROCEPHIN)  IV 1 g (08/21/18 1940)     LOS: 4 days     Cordelia Poche, MD [facility] 08/22/2018, 1:37 PM  If 7PM-7AM, please contact night-coverage [email or web address]

## 2018-08-22 NOTE — Plan of Care (Signed)
  Problem: Safety: Goal: Ability to remain free from injury will improve Outcome: Completed/Met

## 2018-08-22 NOTE — Progress Notes (Signed)
Subjective:  Doing well.  Denies any chest pain or shortness of breath.  Discussed again regarding further elevated cardiac enzymes, and various options of treatment wanted medical management only.  Objective:  Vital Signs in the last 24 hours: Temp:  [98.6 F (37 C)] 98.6 F (37 C) (06/16 0538) Pulse Rate:  [90-95] 90 (06/16 0538) Resp:  [20-24] 20 (06/16 0538) BP: (129-143)/(68-78) 130/68 (06/16 0843) SpO2:  [96 %-99 %] 96 % (06/16 0538) Weight:  [86.1 kg] 86.1 kg (06/16 0538)  Intake/Output from previous day: 06/15 0701 - 06/16 0700 In: 1198.6 [P.O.:360; I.V.:738.6; IV Piggyback:100] Out: 1025 [Urine:1025] Intake/Output from this shift: Total I/O In: -  Out: 300 [Urine:300]  Physical Exam: Neck: no adenopathy, no carotid bruit, no JVD and supple, symmetrical, trachea midline Lungs: clear to auscultation bilaterally Heart: regular rate and rhythm, S1, S2 normal and soft systolic murmur noted Abdomen: soft, non-tender; bowel sounds normal; no masses,  no organomegaly Extremities: bilateral BKA noted  Lab Results: Recent Labs    08/21/18 0442 08/22/18 0504  WBC 11.3* 9.5  HGB 8.2* 7.7*  PLT 156 177   Recent Labs    08/21/18 1240 08/22/18 0504  NA 138 136  K 4.2 4.0  CL 110 110  CO2 19* 18*  GLUCOSE 156* 146*  BUN 36* 35*  CREATININE 3.04* 2.97*   Recent Labs    08/20/18 0735 08/22/18 0504  TROPONINI 25.53* 29.16*   Hepatic Function Panel No results for input(s): PROT, ALBUMIN, AST, ALT, ALKPHOS, BILITOT, BILIDIR, IBILI in the last 72 hours. No results for input(s): CHOL in the last 72 hours. No results for input(s): PROTIME in the last 72 hours.  Imaging: Imaging results have been reviewed and No results found.  Cardiac Studies:  Assessment/Plan:  Acute inferior wall MI Coronary artery disease status post CABG x4 in 2008 hypertension Diabetes mellitus Acute on chronic kidney disease stage IV Anemia of chronic disease Hyperlipidemia Resolving  UTI Status post hematemesis Peripheral vascular disease status post bilateral BKA Plan Okay to DC heparin. Check labs in a.m. Consider GI evaluation  LOS: 4 days    Charolette Forward 08/22/2018, 12:35 PM

## 2018-08-23 LAB — CULTURE, BLOOD (ROUTINE X 2)
Culture: NO GROWTH
Culture: NO GROWTH

## 2018-08-23 LAB — BASIC METABOLIC PANEL
Anion gap: 10 (ref 5–15)
BUN: 32 mg/dL — ABNORMAL HIGH (ref 8–23)
CO2: 18 mmol/L — ABNORMAL LOW (ref 22–32)
Calcium: 8.6 mg/dL — ABNORMAL LOW (ref 8.9–10.3)
Chloride: 109 mmol/L (ref 98–111)
Creatinine, Ser: 2.63 mg/dL — ABNORMAL HIGH (ref 0.61–1.24)
GFR calc Af Amer: 28 mL/min — ABNORMAL LOW (ref >60)
GFR calc non Af Amer: 24 mL/min — ABNORMAL LOW (ref >60)
Glucose, Bld: 154 mg/dL — ABNORMAL HIGH (ref 70–99)
Potassium: 4.2 mmol/L (ref 3.5–5.1)
Sodium: 137 mmol/L (ref 135–145)

## 2018-08-23 LAB — GLUCOSE, CAPILLARY
Glucose-Capillary: 126 mg/dL — ABNORMAL HIGH (ref 70–99)
Glucose-Capillary: 141 mg/dL — ABNORMAL HIGH (ref 70–99)

## 2018-08-23 LAB — TROPONIN I: Troponin I: 13.62 ng/mL (ref <0.03)

## 2018-08-23 LAB — CBC
HCT: 25.3 % — ABNORMAL LOW (ref 39.0–52.0)
Hemoglobin: 8 g/dL — ABNORMAL LOW (ref 13.0–17.0)
MCH: 28.4 pg (ref 26.0–34.0)
MCHC: 31.6 g/dL (ref 30.0–36.0)
MCV: 89.7 fL (ref 80.0–100.0)
Platelets: 191 10*3/uL (ref 150–400)
RBC: 2.82 MIL/uL — ABNORMAL LOW (ref 4.22–5.81)
RDW: 13.1 % (ref 11.5–15.5)
WBC: 8.5 10*3/uL (ref 4.0–10.5)
nRBC: 0 % (ref 0.0–0.2)

## 2018-08-23 MED ORDER — LISINOPRIL 20 MG PO TABS: ORAL_TABLET | ORAL | Status: DC

## 2018-08-23 MED ORDER — ATORVASTATIN CALCIUM 40 MG PO TABS: 40.0000 mg | ORAL_TABLET | Freq: Every morning | ORAL | 0 refills | Status: DC

## 2018-08-23 MED ORDER — CEPHALEXIN 500 MG PO CAPS: 500.0000 mg | ORAL_CAPSULE | Freq: Two times a day (BID) | ORAL | 0 refills | Status: AC

## 2018-08-23 MED ORDER — AMLODIPINE BESYLATE 2.5 MG PO TABS: 2.5000 mg | ORAL_TABLET | Freq: Every day | ORAL | 0 refills | Status: DC

## 2018-08-23 NOTE — Discharge Instructions (Signed)
[name],  You were in the hospital because of a heart attack. You will need to follow-up with your heart doctor. You also had some kidney disease. Please follow-up with a kidney doctor.

## 2018-08-23 NOTE — Discharge Summary (Signed)
Physician Discharge Summary  [name] QVZ:[id number] DOB: October 01, 1950 DOA: 08/18/2018  PCP: Boneta Lucks, MD  Admit date: 08/18/2018 Discharge date: 08/23/2018  Admitted From: Home Disposition: Home  Recommendations for Outpatient Follow-up:  1. Follow up with PCP in 1 week 2. Follow up with Cardiology in 1 week 3. Follow up with nephrology for established care 4. Please obtain BMP/CBC in one week 5. Please follow up on the following pending results: None  Home Health: None Equipment/Devices: None  Discharge Condition: Stable CODE STATUS: Full code Diet recommendation: Heart healthy   Brief/Interim Summary:  Admission HPI written by Etta Quill, DO   Chief Complaint: Chest pain  HPI: [name] is a 68 y.o. 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  Patient presents to the ED at [facility] with c/o CP, fever, N/V.  Patient ate breakfast this morning around 10, started having N/V around 11.  After several episodes of vomiting he began to have CP around noon.    Hospital course:  Sepsis Present on admission. Leukocytosis and fever. Secondary to UTI. Empirically treating with ceftriaxone. Urine culture as mentioned below.  UTI Urine culture significant for >100k Klebsiella Pneumoniae. On empiric ceftriaxone. Blood cultures with no growth. Discharged on Keflex for 2 more days to complete a 7 day course  NSTEMI Patient with significant CAD history. S/p CABG x4. Currently declining cath secondary to risk of kidney failure from contrast. High risk patient. Heparin drip started. Cardiology on board. For now, plan is for medical management. Troponin trended down. Outpatient follow-up.  AKI on CKD III Baseline creatinine of 1.4-1.5 but from 6 years ago. Creatinine of 2.68 on admission and trended up to a peak of 3.04. Renal ultrasound significant for no hydronephrosis and evidence of  medical renal disease. Trended down to 2.63 prior to discharge. Recommend nephrology follow-up. Hold lisinopril on discharge.  Chronic systolic heart failure EF of 35-40%. Euvolemic. -Cardiology recommendations  Diabetes mellitus, type 2 Listed on history. Not on any medications as an outpatient. Hemoglobin A1C of 5.6%.  Atrial flutter Per chart review. Continue metoprolol  Anemia Likely chronic disease from kidney disease. No obvious bleeding. Baseline is between 8-8.5. stable  Discharge Diagnoses:  Principal Problem:   Sepsis secondary to UTI [facility]) Active Problems:   S/P CABG x 4   Diabetes mellitus type 2, uncontrolled, with complications ([place])   Chronic systolic heart failure (HCC)   NSTEMI (non-ST elevated myocardial infarction) [facility])   Renal insufficiency    Discharge Instructions  Discharge Instructions    Call MD for:  difficulty breathing, headache or visual disturbances   Complete by: As directed    Call MD for:  persistant nausea and vomiting   Complete by: As directed    Call MD for:  severe uncontrolled pain   Complete by: As directed    Diet - low sodium heart healthy   Complete by: As directed    Increase activity slowly   Complete by: As directed      Allergies as of 08/23/2018   No Known Allergies     Medication List    TAKE these medications   amLODipine 2.5 MG tablet Commonly known as: NORVASC Take 1 tablet (2.5 mg total) by mouth daily. Start taking on: August 24, 2018   aspirin EC 81 MG tablet Take 81 mg by mouth every morning.   atorvastatin 40 MG tablet Commonly  known as: LIPITOR Take 1 tablet (40 mg total) by mouth every morning. What changed:   medication strength  how much to take   cephALEXin 500 MG capsule Commonly known as: KEFLEX Take 1 capsule (500 mg total) by mouth 2 (two) times daily for 2 days.   lisinopril 20 MG tablet Commonly known as: ZESTRIL Please hold this medication and discuss with your primary care  physician or cardiologist. What changed:   how much to take  how to take this  when to take this  additional instructions   metoprolol succinate 50 MG 24 hr tablet Commonly known as: TOPROL-XL Take 50 mg by mouth every morning.   multivitamin with minerals Tabs tablet Take 1 tablet by mouth every morning.   nitroGLYCERIN 0.4 MG SL tablet Commonly known as: NITROSTAT Place 0.4 mg under the tongue every 5 (five) minutes as needed for chest pain.   tamsulosin 0.4 MG Caps capsule Commonly known as: FLOMAX Take 0.4 mg by mouth every morning.   VITAMIN B-12 PO Take 1 tablet by mouth every morning.      Follow-up Information    Boneta Lucks, MD. Schedule an appointment as soon as possible for a visit in 1 week(s).   Specialty: Family Medicine Why: Hospital follow-up Contact information: [facility] [place] [address] [phone]        Charolette Forward, MD. Schedule an appointment as soon as possible for a visit in 1 week(s).   Specialty: Cardiology Why: Heart attack Contact information: [address] [place] [address] [phone]        Mauricia Area, MD Follow up.   Specialty: Nephrology Contact information: [place] [address] [phone]          No Known Allergies  Consultations:  Cardiology   Procedures/Studies: US Renal  Result Date: 08/20/2018 CLINICAL DATA:  Acute kidney injury. EXAM: RENAL / URINARY TRACT ULTRASOUND COMPLETE COMPARISON:  CT abdomen and pelvis 10/11/2012 FINDINGS: Right Kidney: Renal measurements: 9.8 x 4.7 x 5.7 cm = volume: 137 mL. Increased parenchymal echogenicity. Small cysts measuring 16 mm and 7 mm. No hydronephrosis. Left Kidney: Renal measurements: 8.3 x 4.0 x 4.5 cm = volume: 90 mL. Increased parenchymal echogenicity. No mass or hydronephrosis. Bladder: Mild bladder wall thickening, possibly with a small amount of layering debris  posteriorly. Heterogeneous appearance of the prostate with small calcifications and a 1.5 cm cystic region, query prior TURP. IMPRESSION: 1. Echogenic kidneys compatible with medical renal disease.  Note 2. No hydronephrosis. 3. Mild bladder wall thickening and possible debris in the bladder. Electronically Signed   By: Logan Bores M.D.   On: 08/20/2018 12:54   Dg Chest Portable 1 View  Result Date: 08/18/2018 CLINICAL DATA:  Chest pain today with fever. EXAM: PORTABLE CHEST 1 VIEW COMPARISON:  10/10/2012 FINDINGS: Stable changes from prior CABG surgery. The cardiac silhouette is borderline enlarged. No mediastinal or hilar masses or convincing adenopathy. Clear lungs.  No pleural effusion or pneumothorax. Old, healed left rib fracture.  No acute skeletal abnormality. IMPRESSION: No acute cardiopulmonary disease. Electronically Signed   By: Lajean Manes M.D.   On: 08/18/2018 18:25      08/19/2018: Transthoracic Echocardiogram IMPRESSIONS   1. Stage 1: stage1: Multiple segmental abnormalities exist. See findings. 2. The left ventricle has moderately reduced systolic function, with an ejection fraction of 35-40%. The cavity size was normal. There is mild concentric left ventricular hypertrophy. Diastolic dysfunction, grade indeterminate.  Left ventrical global  hypokinesis without regional wall motion abnormalities. 3. Left atrial size was mildly dilated. 4. The mitral valve is grossly normal. Mild thickening of the mitral valve leaflet. Mitral valve regurgitation is moderate by color flow Doppler. 5. The tricuspid valve is grossly normal. 6. The aortic valve is grossly normal. Mild thickening of the aortic valve. 7. Mild aortic annular calcification. 8. The aortic root is normal in size and structure. 9. The interatrial septum was not well visualized.   Subjective: No chest pain or dyspnea  Discharge Exam: Vitals:   08/23/18 0615 08/23/18 0930  BP: 125/77 (!) 146/82  Pulse:   87  Resp: 18   Temp: 97.9 F (36.6 C)   SpO2: 100%    Vitals:   08/22/18 2119 08/23/18 0615 08/23/18 0700 08/23/18 0930  BP: 126/72 125/77  (!) 146/82  Pulse: 85   87  Resp: (!) 24 18    Temp: 99.2 F (37.3 C) 97.9 F (36.6 C)    TempSrc: Oral Oral    SpO2: 100% 100%    Weight:   91.1 kg   Height:        General: Pt is alert, awake, not in acute distress Cardiovascular: RRR, S1/S2 +, no rubs, no gallops Respiratory: CTA bilaterally, no wheezing, no rhonchi Abdominal: Soft, NT, ND, bowel sounds + Extremities: no edema, no cyanosis, bilateral BKA    The results of significant diagnostics from this hospitalization (including imaging, microbiology, ancillary and laboratory) are listed below for reference.     Microbiology: Recent Results (from the past 240 hour(s))  Urine culture     Status: Abnormal   Collection Time: 08/18/18  4:32 PM   Specimen: Urine, Random  Result Value Ref Range Status   Specimen Description URINE, RANDOM  Final   Special Requests   Final    NONE Performed at [facility], [address], [place], [place] [address]    Culture >=100,000 COLONIES/mL KLEBSIELLA PNEUMONIAE (A)  Final   Report Status 08/21/2018 FINAL  Final   Organism ID, Bacteria KLEBSIELLA PNEUMONIAE (A)  Final      Susceptibility   Klebsiella pneumoniae - MIC*    AMPICILLIN >=32 RESISTANT Resistant     CEFAZOLIN <=4 SENSITIVE Sensitive     CEFTRIAXONE <=1 SENSITIVE Sensitive     CIPROFLOXACIN <=0.25 SENSITIVE Sensitive     GENTAMICIN <=1 SENSITIVE Sensitive     IMIPENEM <=0.25 SENSITIVE Sensitive     NITROFURANTOIN 64 INTERMEDIATE Intermediate     TRIMETH/SULFA <=20 SENSITIVE Sensitive     AMPICILLIN/SULBACTAM 4 SENSITIVE Sensitive     PIP/TAZO <=4 SENSITIVE Sensitive     Extended ESBL NEGATIVE Sensitive     * >=100,000 COLONIES/mL KLEBSIELLA PNEUMONIAE  SARS Coronavirus 2 (CEPHEID- Performed in [place] [facility] lab), Hosp Order     Status: None   Collection  Time: 08/18/18  5:04 PM   Specimen: Urine, Clean Catch; Nasopharyngeal  Result Value Ref Range Status   SARS Coronavirus 2 NEGATIVE NEGATIVE Final    Comment: (NOTE) If result is NEGATIVE SARS-CoV-2 target nucleic acids are NOT DETECTED. The SARS-CoV-2 RNA is generally detectable in upper and lower  respiratory specimens during the acute phase of infection. The lowest  concentration of SARS-CoV-2 viral copies this assay can detect is 250  copies / mL. A negative result does not preclude SARS-CoV-2 infection  and should not be used as the sole basis for treatment or other  patient management decisions.  A negative result  may occur with  improper specimen collection / handling, submission of specimen other  than nasopharyngeal swab, presence of viral mutation(s) within the  areas targeted by this assay, and inadequate number of viral copies  (<250 copies / mL). A negative result must be combined with clinical  observations, patient history, and epidemiological information. If result is POSITIVE SARS-CoV-2 target nucleic acids are DETECTED. The SARS-CoV-2 RNA is generally detectable in upper and lower  respiratory specimens dur ing the acute phase of infection.  Positive  results are indicative of active infection with SARS-CoV-2.  Clinical  correlation with patient history and other diagnostic information is  necessary to determine patient infection status.  Positive results do  not rule out bacterial infection or co-infection with other viruses. If result is PRESUMPTIVE POSTIVE SARS-CoV-2 nucleic acids MAY BE PRESENT.   A presumptive positive result was obtained on the submitted specimen  and confirmed on repeat testing.  While 2019 novel coronavirus  (SARS-CoV-2) nucleic acids may be present in the submitted sample  additional confirmatory testing may be necessary for epidemiological  and / or clinical management purposes  to differentiate between  SARS-CoV-2 and other Sarbecovirus  currently known to infect humans.  If clinically indicated additional testing with an alternate test  methodology [phone]) is advised. The SARS-CoV-2 RNA is generally  detectable in upper and lower respiratory sp ecimens during the acute  phase of infection. The expected result is Negative. Fact Sheet for Patients:  StrictlyIdeas.no Fact Sheet for Healthcare Providers: BankingDealers.co.za This test is not yet approved or cleared by the [place] FDA and has been authorized for detection and/or diagnosis of SARS-CoV-2 by FDA under an Emergency Use Authorization (EUA).  This EUA will remain in effect (meaning this test can be used) for the duration of the COVID-19 declaration under Section 564(b)(1) of the Act, 21 U.S.C. section 360bbb-3(b)(1), unless the authorization is terminated or revoked sooner. Performed at [facility], [place] [address], [place], [place] [address]   Blood culture (routine x 2)     Status: None (Preliminary result)   Collection Time: 08/18/18  5:10 PM   Specimen: BLOOD  Result Value Ref Range Status   Specimen Description BLOOD RIGHT ANTECUBITAL  Final   Special Requests   Final    BOTTLES DRAWN AEROBIC AND ANAEROBIC Blood Culture results may not be optimal due to an inadequate volume of blood received in culture bottles   Culture   Final    NO GROWTH 4 DAYS Performed at [facility], [place] [address] [place], [place] [address]    Report Status PENDING  Incomplete  Blood culture (routine x 2)     Status: None (Preliminary result)   Collection Time: 08/18/18  5:11 PM   Specimen: BLOOD  Result Value Ref Range Status   Specimen Description BLOOD BLOOD LEFT FOREARM  Final   Special Requests   Final    BOTTLES DRAWN AEROBIC AND ANAEROBIC Blood Culture results may not be optimal due to an inadequate volume of blood received in culture bottles   Culture   Final    NO GROWTH 4 DAYS Performed at [facility], [place] [address], [place], [place] [address]    Report Status PENDING  Incomplete     Labs: BNP (last 3 results) Recent Labs    08/18/18 1630  BNP 884.1*   Basic Metabolic Panel: Recent Labs  Lab 08/18/18 1630  08/20/18 0735 08/21/18 0442 08/21/18 1240 08/22/18 0504 08/23/18  0423  NA 142   < > 137 137 138 136 137  K 3.2*   < > 3.9 4.1 4.2 4.0 4.2  CL 112*   < > 107 108 110 110 109  CO2 19*   < > 16* 18* 19* 18* 18*  GLUCOSE 130*   < > 216* 196* 156* 146* 154*  BUN 23   < > 33* 37* 36* 35* 32*  CREATININE 2.68*   < > 3.21* 3.17* 3.04* 2.97* 2.63*  CALCIUM 8.4*   < > 8.6* 8.5* 8.7* 8.5* 8.6*  MG 1.7  --   --   --   --   --   --    < > = values in this interval not displayed.   Liver Function Tests: Recent Labs  Lab 08/18/18 1630  AST 20  ALT 12  ALKPHOS 62  BILITOT 0.8  PROT 6.1*  ALBUMIN 3.2*   No results for input(s): LIPASE, AMYLASE in the last 168 hours. No results for input(s): AMMONIA in the last 168 hours. CBC: Recent Labs  Lab 08/19/18 0304 08/20/18 0735 08/21/18 0442 08/22/18 0504 08/23/18 0423  WBC 16.4* 13.3* 11.3* 9.5 8.5  HGB 9.1* 8.8* 8.2* 7.7* 8.0*  HCT 29.0* 29.9* 26.2* 24.6* 25.3*  MCV 91.8 97.1 91.9 89.8 89.7  PLT 205 192 156 177 191   Cardiac Enzymes: Recent Labs  Lab 08/19/18 1536 08/20/18 0003 08/20/18 0735 08/22/18 0504 08/23/18 1014  TROPONINI 35.22* 30.75* 25.53* 29.16* 13.62*   BNP: Invalid input(s): POCBNP CBG: Recent Labs  Lab 08/22/18 1125 08/22/18 1558 08/22/18 2116 08/23/18 0731 08/23/18 1226  GLUCAP 194* 156* 120* 126* 141*   D-Dimer No results for input(s): DDIMER in the last 72 hours. Hgb A1c No results for input(s): HGBA1C in the last 72 hours. Lipid Profile No results for input(s): CHOL, HDL, LDLCALC, TRIG, CHOLHDL, LDLDIRECT in the last 72 hours. Thyroid function studies No results for input(s): TSH, T4TOTAL, T3FREE, THYROIDAB in the last 72 hours.  Invalid input(s): FREET3 Anemia  work up No results for input(s): VITAMINB12, FOLATE, FERRITIN, TIBC, IRON, RETICCTPCT in the last 72 hours. Urinalysis    Component Value Date/Time   COLORURINE YELLOW 08/18/2018 1632   APPEARANCEUR HAZY (A) 08/18/2018 1632   LABSPEC 1.011 08/18/2018 1632   PHURINE 5.0 08/18/2018 1632   GLUCOSEU NEGATIVE 08/18/2018 1632   HGBUR MODERATE (A) 08/18/2018 1632   BILIRUBINUR NEGATIVE 08/18/2018 1632   KETONESUR 5 (A) 08/18/2018 1632   PROTEINUR >=300 (A) 08/18/2018 1632   UROBILINOGEN 0.2 10/15/2012 1225   NITRITE NEGATIVE 08/18/2018 1632   LEUKOCYTESUR MODERATE (A) 08/18/2018 1632   Sepsis Labs Invalid input(s): PROCALCITONIN,  WBC,  LACTICIDVEN Microbiology Recent Results (from the past 240 hour(s))  Urine culture     Status: Abnormal   Collection Time: 08/18/18  4:32 PM   Specimen: Urine, Random  Result Value Ref Range Status   Specimen Description URINE, RANDOM  Final   Special Requests   Final    NONE Performed at [facility], [place] [address], [place], [place] [address]    Culture >=100,000 COLONIES/mL KLEBSIELLA PNEUMONIAE (A)  Final   Report Status 08/21/2018 FINAL  Final   Organism ID, Bacteria KLEBSIELLA PNEUMONIAE (A)  Final      Susceptibility   Klebsiella pneumoniae - MIC*    AMPICILLIN >=32 RESISTANT Resistant     CEFAZOLIN <=4 SENSITIVE Sensitive     CEFTRIAXONE <=1 SENSITIVE Sensitive     CIPROFLOXACIN <=0.25 SENSITIVE Sensitive  GENTAMICIN <=1 SENSITIVE Sensitive     IMIPENEM <=0.25 SENSITIVE Sensitive     NITROFURANTOIN 64 INTERMEDIATE Intermediate     TRIMETH/SULFA <=20 SENSITIVE Sensitive     AMPICILLIN/SULBACTAM 4 SENSITIVE Sensitive     PIP/TAZO <=4 SENSITIVE Sensitive     Extended ESBL NEGATIVE Sensitive     * >=100,000 COLONIES/mL KLEBSIELLA PNEUMONIAE  SARS Coronavirus 2 (CEPHEID- Performed in [facility] lab), Hosp Order     Status: None   Collection Time: 08/18/18  5:04 PM   Specimen: Urine, Clean Catch; Nasopharyngeal  Result  Value Ref Range Status   SARS Coronavirus 2 NEGATIVE NEGATIVE Final    Comment: (NOTE) If result is NEGATIVE SARS-CoV-2 target nucleic acids are NOT DETECTED. The SARS-CoV-2 RNA is generally detectable in upper and lower  respiratory specimens during the acute phase of infection. The lowest  concentration of SARS-CoV-2 viral copies this assay can detect is 250  copies / mL. A negative result does not preclude SARS-CoV-2 infection  and should not be used as the sole basis for treatment or other  patient management decisions.  A negative result may occur with  improper specimen collection / handling, submission of specimen other  than nasopharyngeal swab, presence of viral mutation(s) within the  areas targeted by this assay, and inadequate number of viral copies  (<250 copies / mL). A negative result must be combined with clinical  observations, patient history, and epidemiological information. If result is POSITIVE SARS-CoV-2 target nucleic acids are DETECTED. The SARS-CoV-2 RNA is generally detectable in upper and lower  respiratory specimens dur ing the acute phase of infection.  Positive  results are indicative of active infection with SARS-CoV-2.  Clinical  correlation with patient history and other diagnostic information is  necessary to determine patient infection status.  Positive results do  not rule out bacterial infection or co-infection with other viruses. If result is PRESUMPTIVE POSTIVE SARS-CoV-2 nucleic acids MAY BE PRESENT.   A presumptive positive result was obtained on the submitted specimen  and confirmed on repeat testing.  While 2019 novel coronavirus  (SARS-CoV-2) nucleic acids may be present in the submitted sample  additional confirmatory testing may be necessary for epidemiological  and / or clinical management purposes  to differentiate between  SARS-CoV-2 and other Sarbecovirus currently known to infect humans.  If clinically indicated additional testing  with an alternate test  methodology [phone]) is advised. The SARS-CoV-2 RNA is generally  detectable in upper and lower respiratory sp ecimens during the acute  phase of infection. The expected result is Negative. Fact Sheet for Patients:  StrictlyIdeas.no Fact Sheet for Healthcare Providers: BankingDealers.co.za This test is not yet approved or cleared by the [place] FDA and has been authorized for detection and/or diagnosis of SARS-CoV-2 by FDA under an Emergency Use Authorization (EUA).  This EUA will remain in effect (meaning this test can be used) for the duration of the COVID-19 declaration under Section 564(b)(1) of the Act, 21 U.S.C. section 360bbb-3(b)(1), unless the authorization is terminated or revoked sooner. Performed at [facility], [place] [address], [place], [place] [address]   Blood culture (routine x 2)     Status: None (Preliminary result)   Collection Time: 08/18/18  5:10 PM   Specimen: BLOOD  Result Value Ref Range Status   Specimen Description BLOOD RIGHT ANTECUBITAL  Final   Special Requests   Final    BOTTLES DRAWN AEROBIC AND ANAEROBIC Blood Culture results may not be optimal due to  an inadequate volume of blood received in culture bottles   Culture   Final    NO GROWTH 4 DAYS Performed at [facility], [place] [address] [place], [place] [address]    Report Status PENDING  Incomplete  Blood culture (routine x 2)     Status: None (Preliminary result)   Collection Time: 08/18/18  5:11 PM   Specimen: BLOOD  Result Value Ref Range Status   Specimen Description BLOOD BLOOD LEFT FOREARM  Final   Special Requests   Final    BOTTLES DRAWN AEROBIC AND ANAEROBIC Blood Culture results may not be optimal due to an inadequate volume of blood received in culture bottles   Culture   Final    NO GROWTH 4 DAYS Performed at [facility], [place] [address] [place], [place] [address]    Report Status  PENDING  Incomplete     SIGNED:   Cordelia Poche, MD [facility] 08/23/2018, 12:42 PM

## 2018-08-23 NOTE — Progress Notes (Signed)
Subjective:  Patient denies any chest pain or shortness of breath, eager to go home.  Hemoglobin stable.  Renal function slowly improving.  Troponin I is still pending  Objective:  Vital Signs in the last 24 hours: Temp:  [97.9 F (36.6 C)-99.2 F (37.3 C)] 97.9 F (36.6 C) (06/17 0615) Pulse Rate:  [85-87] 87 (06/17 0930) Resp:  [18-24] 18 (06/17 0615) BP: (119-146)/(72-82) 146/82 (06/17 0930) SpO2:  [99 %-100 %] 100 % (06/17 0615) Weight:  [91.1 kg] 91.1 kg (06/17 0700)  Intake/Output from previous day: 06/16 0701 - 06/17 0700 In: 1045.3 [P.O.:480; I.V.:465.3; IV Piggyback:100] Out: 1950 [Urine:1950] Intake/Output from this shift: Total I/O In: 1164.9 [P.O.:240; I.V.:824.9; IV Piggyback:100] Out: 375 [Urine:375]  Physical Exam: Neck: no adenopathy, no carotid bruit, no JVD and supple, symmetrical, trachea midline Lungs: clear to auscultation bilaterally Heart: regular rate and rhythm, S1, S2 normal and 2/6 systolic murmur noted Abdomen: soft, non-tender; bowel sounds normal; no masses,  no organomegaly Extremities: bilateral BKA noted  Lab Results: Recent Labs    08/22/18 0504 08/23/18 0423  WBC 9.5 8.5  HGB 7.7* 8.0*  PLT 177 191   Recent Labs    08/22/18 0504 08/23/18 0423  NA 136 137  K 4.0 4.2  CL 110 109  CO2 18* 18*  GLUCOSE 146* 154*  BUN 35* 32*  CREATININE 2.97* 2.63*   Recent Labs    08/22/18 0504  TROPONINI 29.16*   Hepatic Function Panel No results for input(s): PROT, ALBUMIN, AST, ALT, ALKPHOS, BILITOT, BILIDIR, IBILI in the last 72 hours. No results for input(s): CHOL in the last 72 hours. No results for input(s): PROTIME in the last 72 hours.  Imaging: Imaging results have been reviewed and No results found.  Cardiac Studies:  Assessment/Plan:  Acute inferior wall MI Coronary artery disease status post CABG x4 in 2008 hypertension Diabetes mellitus Acute on chronic kidney disease stage IV Anemia of chronic  disease Hyperlipidemia Resolving UTI Status post hematemesis Peripheral vascular disease status post bilateral BKA\ Plan Continue present management. Check troponin I. Okay to discharge from cardiac point of view.  If troponin are trending down.  Follow up with me in one week    LOS: 5 days    Charolette Forward 08/23/2018, 11:26 AM

## 2018-09-11 DIAGNOSIS — Z951 Presence of aortocoronary bypass graft: Secondary | ICD-10-CM | POA: Diagnosis not present

## 2018-09-11 DIAGNOSIS — N189 Chronic kidney disease, unspecified: Secondary | ICD-10-CM | POA: Diagnosis not present

## 2018-09-11 DIAGNOSIS — E1122 Type 2 diabetes mellitus with diabetic chronic kidney disease: Secondary | ICD-10-CM | POA: Diagnosis not present

## 2018-09-11 DIAGNOSIS — I739 Peripheral vascular disease, unspecified: Secondary | ICD-10-CM | POA: Diagnosis not present

## 2018-09-11 DIAGNOSIS — I2510 Atherosclerotic heart disease of native coronary artery without angina pectoris: Secondary | ICD-10-CM | POA: Diagnosis not present

## 2018-09-11 DIAGNOSIS — I10 Essential (primary) hypertension: Secondary | ICD-10-CM | POA: Diagnosis not present

## 2018-09-11 DIAGNOSIS — F17290 Nicotine dependence, other tobacco product, uncomplicated: Secondary | ICD-10-CM | POA: Diagnosis not present

## 2018-12-15 DIAGNOSIS — F17290 Nicotine dependence, other tobacco product, uncomplicated: Secondary | ICD-10-CM | POA: Diagnosis not present

## 2018-12-15 DIAGNOSIS — E785 Hyperlipidemia, unspecified: Secondary | ICD-10-CM | POA: Diagnosis not present

## 2018-12-15 DIAGNOSIS — N189 Chronic kidney disease, unspecified: Secondary | ICD-10-CM | POA: Diagnosis not present

## 2018-12-15 DIAGNOSIS — I2510 Atherosclerotic heart disease of native coronary artery without angina pectoris: Secondary | ICD-10-CM | POA: Diagnosis not present

## 2018-12-15 DIAGNOSIS — I10 Essential (primary) hypertension: Secondary | ICD-10-CM | POA: Diagnosis not present

## 2018-12-15 DIAGNOSIS — E119 Type 2 diabetes mellitus without complications: Secondary | ICD-10-CM | POA: Diagnosis not present

## 2018-12-15 DIAGNOSIS — I739 Peripheral vascular disease, unspecified: Secondary | ICD-10-CM | POA: Diagnosis not present

## 2018-12-18 ENCOUNTER — Inpatient Hospital Stay (HOSPITAL_COMMUNITY)
Admission: EM | Admit: 2018-12-18 | Discharge: 2018-12-20 | DRG: 311 | Disposition: A | Discharge status: Home / Self Care | Payer: Medicare HMO | Attending: Cardiology | Admitting: Cardiology

## 2018-12-18 ENCOUNTER — Other Ambulatory Visit: Payer: Self-pay

## 2018-12-18 ENCOUNTER — Encounter (HOSPITAL_COMMUNITY): Payer: Self-pay | Admitting: Emergency Medicine

## 2018-12-18 ENCOUNTER — Emergency Department (HOSPITAL_COMMUNITY): Payer: Medicare HMO

## 2018-12-18 DIAGNOSIS — J302 Other seasonal allergic rhinitis: Secondary | ICD-10-CM | POA: Diagnosis present

## 2018-12-18 DIAGNOSIS — Z89511 Acquired absence of right leg below knee: Secondary | ICD-10-CM | POA: Diagnosis not present

## 2018-12-18 DIAGNOSIS — R079 Chest pain, unspecified: Secondary | ICD-10-CM

## 2018-12-18 DIAGNOSIS — R7989 Other specified abnormal findings of blood chemistry: Secondary | ICD-10-CM | POA: Diagnosis not present

## 2018-12-18 DIAGNOSIS — E1151 Type 2 diabetes mellitus with diabetic peripheral angiopathy without gangrene: Secondary | ICD-10-CM | POA: Diagnosis present

## 2018-12-18 DIAGNOSIS — Z20828 Contact with and (suspected) exposure to other viral communicable diseases: Secondary | ICD-10-CM | POA: Diagnosis present

## 2018-12-18 DIAGNOSIS — R531 Weakness: Secondary | ICD-10-CM

## 2018-12-18 DIAGNOSIS — Z951 Presence of aortocoronary bypass graft: Secondary | ICD-10-CM

## 2018-12-18 DIAGNOSIS — I255 Ischemic cardiomyopathy: Secondary | ICD-10-CM | POA: Diagnosis present

## 2018-12-18 DIAGNOSIS — R112 Nausea with vomiting, unspecified: Secondary | ICD-10-CM | POA: Diagnosis not present

## 2018-12-18 DIAGNOSIS — E1122 Type 2 diabetes mellitus with diabetic chronic kidney disease: Secondary | ICD-10-CM | POA: Diagnosis present

## 2018-12-18 DIAGNOSIS — Z23 Encounter for immunization: Secondary | ICD-10-CM

## 2018-12-18 DIAGNOSIS — E119 Type 2 diabetes mellitus without complications: Secondary | ICD-10-CM | POA: Diagnosis not present

## 2018-12-18 DIAGNOSIS — Z8673 Personal history of transient ischemic attack (TIA), and cerebral infarction without residual deficits: Secondary | ICD-10-CM

## 2018-12-18 DIAGNOSIS — Z89512 Acquired absence of left leg below knee: Secondary | ICD-10-CM

## 2018-12-18 DIAGNOSIS — Z7982 Long term (current) use of aspirin: Secondary | ICD-10-CM

## 2018-12-18 DIAGNOSIS — R0789 Other chest pain: Secondary | ICD-10-CM | POA: Diagnosis not present

## 2018-12-18 DIAGNOSIS — I129 Hypertensive chronic kidney disease with stage 1 through stage 4 chronic kidney disease, or unspecified chronic kidney disease: Secondary | ICD-10-CM | POA: Diagnosis present

## 2018-12-18 DIAGNOSIS — Z87891 Personal history of nicotine dependence: Secondary | ICD-10-CM

## 2018-12-18 DIAGNOSIS — N184 Chronic kidney disease, stage 4 (severe): Secondary | ICD-10-CM | POA: Diagnosis not present

## 2018-12-18 DIAGNOSIS — Z79899 Other long term (current) drug therapy: Secondary | ICD-10-CM

## 2018-12-18 DIAGNOSIS — I340 Nonrheumatic mitral (valve) insufficiency: Secondary | ICD-10-CM | POA: Diagnosis present

## 2018-12-18 DIAGNOSIS — I4510 Unspecified right bundle-branch block: Secondary | ICD-10-CM | POA: Diagnosis present

## 2018-12-18 DIAGNOSIS — I252 Old myocardial infarction: Secondary | ICD-10-CM | POA: Diagnosis not present

## 2018-12-18 DIAGNOSIS — K219 Gastro-esophageal reflux disease without esophagitis: Secondary | ICD-10-CM | POA: Diagnosis present

## 2018-12-18 DIAGNOSIS — D631 Anemia in chronic kidney disease: Secondary | ICD-10-CM | POA: Diagnosis not present

## 2018-12-18 DIAGNOSIS — I25118 Atherosclerotic heart disease of native coronary artery with other forms of angina pectoris: Secondary | ICD-10-CM | POA: Diagnosis present

## 2018-12-18 DIAGNOSIS — I2510 Atherosclerotic heart disease of native coronary artery without angina pectoris: Secondary | ICD-10-CM | POA: Diagnosis not present

## 2018-12-18 DIAGNOSIS — E785 Hyperlipidemia, unspecified: Secondary | ICD-10-CM | POA: Diagnosis not present

## 2018-12-18 DIAGNOSIS — N390 Urinary tract infection, site not specified: Secondary | ICD-10-CM | POA: Diagnosis not present

## 2018-12-18 DIAGNOSIS — R350 Frequency of micturition: Secondary | ICD-10-CM | POA: Diagnosis present

## 2018-12-18 DIAGNOSIS — I249 Acute ischemic heart disease, unspecified: Principal | ICD-10-CM | POA: Diagnosis present

## 2018-12-18 DIAGNOSIS — I10 Essential (primary) hypertension: Secondary | ICD-10-CM | POA: Diagnosis not present

## 2018-12-18 LAB — CBC
HCT: 28 % — ABNORMAL LOW (ref 39.0–52.0)
HCT: 28.7 % — ABNORMAL LOW (ref 39.0–52.0)
Hemoglobin: 8.5 g/dL — ABNORMAL LOW (ref 13.0–17.0)
Hemoglobin: 8.8 g/dL — ABNORMAL LOW (ref 13.0–17.0)
MCH: 27 pg (ref 26.0–34.0)
MCH: 27.8 pg (ref 26.0–34.0)
MCHC: 30.4 g/dL (ref 30.0–36.0)
MCHC: 30.7 g/dL (ref 30.0–36.0)
MCV: 88.9 fL (ref 80.0–100.0)
MCV: 90.5 fL (ref 80.0–100.0)
Platelets: 171 10*3/uL (ref 150–400)
Platelets: 183 10*3/uL (ref 150–400)
RBC: 3.15 MIL/uL — ABNORMAL LOW (ref 4.22–5.81)
RBC: 3.17 MIL/uL — ABNORMAL LOW (ref 4.22–5.81)
RDW: 17.8 % — ABNORMAL HIGH (ref 11.5–15.5)
RDW: 18 % — ABNORMAL HIGH (ref 11.5–15.5)
WBC: 4.7 10*3/uL (ref 4.0–10.5)
WBC: 5.2 10*3/uL (ref 4.0–10.5)
nRBC: 0 % (ref 0.0–0.2)
nRBC: 0 % (ref 0.0–0.2)

## 2018-12-18 LAB — COMPREHENSIVE METABOLIC PANEL
ALT: 74 U/L — ABNORMAL HIGH (ref 0–44)
AST: 58 U/L — ABNORMAL HIGH (ref 15–41)
Albumin: 3.1 g/dL — ABNORMAL LOW (ref 3.5–5.0)
Alkaline Phosphatase: 125 U/L (ref 38–126)
Anion gap: 11 (ref 5–15)
BUN: 23 mg/dL (ref 8–23)
CO2: 24 mmol/L (ref 22–32)
Calcium: 8.6 mg/dL — ABNORMAL LOW (ref 8.9–10.3)
Chloride: 103 mmol/L (ref 98–111)
Creatinine, Ser: 2.82 mg/dL — ABNORMAL HIGH (ref 0.61–1.24)
GFR calc Af Amer: 25 mL/min — ABNORMAL LOW (ref >60)
GFR calc non Af Amer: 22 mL/min — ABNORMAL LOW (ref >60)
Glucose, Bld: 122 mg/dL — ABNORMAL HIGH (ref 70–99)
Potassium: 5 mmol/L (ref 3.5–5.1)
Sodium: 138 mmol/L (ref 135–145)
Total Bilirubin: 1.2 mg/dL (ref 0.3–1.2)
Total Protein: 6.4 g/dL — ABNORMAL LOW (ref 6.5–8.1)

## 2018-12-18 LAB — URINALYSIS, ROUTINE W REFLEX MICROSCOPIC
Bilirubin Urine: NEGATIVE
Glucose, UA: NEGATIVE mg/dL
Hgb urine dipstick: NEGATIVE
Ketones, ur: NEGATIVE mg/dL
Nitrite: NEGATIVE
Protein, ur: 100 mg/dL — AB
Specific Gravity, Urine: 1.005 (ref 1.005–1.030)
WBC, UA: >50 WBC/hpf — ABNORMAL HIGH (ref 0–5)
pH: 8 (ref 5.0–8.0)

## 2018-12-18 LAB — CREATININE, SERUM
Creatinine, Ser: 2.7 mg/dL — ABNORMAL HIGH (ref 0.61–1.24)
GFR calc Af Amer: 27 mL/min — ABNORMAL LOW (ref >60)
GFR calc non Af Amer: 23 mL/min — ABNORMAL LOW (ref >60)

## 2018-12-18 LAB — POC OCCULT BLOOD, ED: Fecal Occult Bld: NEGATIVE

## 2018-12-18 LAB — TROPONIN I (HIGH SENSITIVITY)
Troponin I (High Sensitivity): 30 ng/L — ABNORMAL HIGH (ref <18)
Troponin I (High Sensitivity): 30 ng/L — ABNORMAL HIGH (ref <18)
Troponin I (High Sensitivity): 35 ng/L — ABNORMAL HIGH (ref <18)
Troponin I (High Sensitivity): 28 ng/L — ABNORMAL HIGH (ref <18)

## 2018-12-18 LAB — HEMOGLOBIN A1C
Hgb A1c MFr Bld: 5.4 % (ref 4.8–5.6)
Mean Plasma Glucose: 108.28 mg/dL

## 2018-12-18 LAB — LIPASE, BLOOD: Lipase: 25 U/L (ref 11–51)

## 2018-12-18 LAB — SARS CORONAVIRUS 2 (TAT 6-24 HRS): SARS Coronavirus 2: NEGATIVE

## 2018-12-18 MED ORDER — SODIUM CHLORIDE 0.9 % IV BOLUS
500.0000 mL | Freq: Once | INTRAVENOUS | Status: AC
  Administered 2018-12-18: 500 mL via INTRAVENOUS

## 2018-12-18 MED ORDER — NITROGLYCERIN 0.4 MG SL SUBL: 0.4000 mg | SUBLINGUAL_TABLET | SUBLINGUAL | Status: DC | PRN

## 2018-12-18 MED ORDER — NITROGLYCERIN 2 % TD OINT
1.0000 [in_us] | TOPICAL_OINTMENT | Freq: Four times a day (QID) | TRANSDERMAL | Status: DC
  Administered 2018-12-18 – 2018-12-20 (×6): 1 [in_us] via TOPICAL
  Filled 2018-12-18: qty 1
  Filled 2018-12-18: qty 30

## 2018-12-18 MED ORDER — ONDANSETRON HCL 4 MG/2ML IJ SOLN: 4.0000 mg | Freq: Four times a day (QID) | INTRAMUSCULAR | Status: DC | PRN

## 2018-12-18 MED ORDER — AMLODIPINE BESYLATE 5 MG PO TABS
2.5000 mg | ORAL_TABLET | Freq: Once | ORAL | Status: AC
  Administered 2018-12-18: 2.5 mg via ORAL
  Filled 2018-12-18: qty 1

## 2018-12-18 MED ORDER — SODIUM CHLORIDE 0.9% FLUSH
3.0000 mL | Freq: Once | INTRAVENOUS | Status: AC
  Administered 2018-12-18: 16:00:00 3 mL via INTRAVENOUS

## 2018-12-18 MED ORDER — SODIUM CHLORIDE 0.9 % IV SOLN: 1.0000 g | INTRAVENOUS | Status: DC

## 2018-12-18 MED ORDER — METOPROLOL TARTRATE 25 MG PO TABS
25.0000 mg | ORAL_TABLET | Freq: Two times a day (BID) | ORAL | Status: DC
  Administered 2018-12-18 – 2018-12-20 (×4): 25 mg via ORAL
  Filled 2018-12-18 (×4): qty 1

## 2018-12-18 MED ORDER — ASPIRIN EC 81 MG PO TBEC
81.0000 mg | DELAYED_RELEASE_TABLET | Freq: Every day | ORAL | Status: DC
  Administered 2018-12-19 – 2018-12-20 (×2): 81 mg via ORAL
  Filled 2018-12-18 (×2): qty 1

## 2018-12-18 MED ORDER — METOPROLOL SUCCINATE ER 25 MG PO TB24
50.0000 mg | ORAL_TABLET | Freq: Once | ORAL | Status: AC
  Administered 2018-12-18: 50 mg via ORAL
  Filled 2018-12-18: qty 2

## 2018-12-18 MED ORDER — SODIUM CHLORIDE 0.9 % IV SOLN
1.0000 g | INTRAVENOUS | Status: DC
  Administered 2018-12-18 – 2018-12-20 (×2): 1 g via INTRAVENOUS
  Filled 2018-12-18 (×2): qty 10

## 2018-12-18 MED ORDER — ASPIRIN 300 MG RE SUPP: 300.0000 mg | RECTAL | Status: AC

## 2018-12-18 MED ORDER — ATORVASTATIN CALCIUM 40 MG PO TABS
40.0000 mg | ORAL_TABLET | Freq: Every morning | ORAL | Status: DC
  Administered 2018-12-19 – 2018-12-20 (×2): 40 mg via ORAL
  Filled 2018-12-18 (×2): qty 1

## 2018-12-18 MED ORDER — SODIUM CHLORIDE 0.9 % IV SOLN
1.0000 g | Freq: Once | INTRAVENOUS | Status: AC
  Administered 2018-12-18: 1 g via INTRAVENOUS
  Filled 2018-12-18: qty 10

## 2018-12-18 MED ORDER — TAMSULOSIN HCL 0.4 MG PO CAPS
0.4000 mg | ORAL_CAPSULE | Freq: Every morning | ORAL | Status: DC
  Administered 2018-12-19 – 2018-12-20 (×2): 0.4 mg via ORAL
  Filled 2018-12-18 (×2): qty 1

## 2018-12-18 MED ORDER — SODIUM CHLORIDE 0.9 % IV SOLN
INTRAVENOUS | Status: DC
  Administered 2018-12-18: 20:00:00 via INTRAVENOUS

## 2018-12-18 MED ORDER — ACETAMINOPHEN 325 MG PO TABS: 650.0000 mg | ORAL_TABLET | ORAL | Status: DC | PRN

## 2018-12-18 MED ORDER — ENOXAPARIN SODIUM 30 MG/0.3ML ~~LOC~~ SOLN
30.0000 mg | SUBCUTANEOUS | Status: DC
  Administered 2018-12-18 – 2018-12-19 (×2): 30 mg via SUBCUTANEOUS
  Filled 2018-12-18 (×2): qty 0.3

## 2018-12-18 MED ORDER — ASPIRIN 81 MG PO CHEW
324.0000 mg | CHEWABLE_TABLET | ORAL | Status: AC
  Administered 2018-12-18: 324 mg via ORAL
  Filled 2018-12-18: qty 4

## 2018-12-18 MED ORDER — PANTOPRAZOLE SODIUM 40 MG PO TBEC
40.0000 mg | DELAYED_RELEASE_TABLET | Freq: Every day | ORAL | Status: DC
  Administered 2018-12-19 – 2018-12-20 (×2): 40 mg via ORAL
  Filled 2018-12-18 (×2): qty 1

## 2018-12-18 MED ORDER — INSULIN ASPART 100 UNIT/ML ~~LOC~~ SOLN
0.0000 [IU] | Freq: Three times a day (TID) | SUBCUTANEOUS | Status: DC
  Administered 2018-12-19 (×2): 1 [IU] via SUBCUTANEOUS

## 2018-12-18 MED ORDER — AMLODIPINE BESYLATE 2.5 MG PO TABS
2.5000 mg | ORAL_TABLET | Freq: Every day | ORAL | Status: DC
  Administered 2018-12-18 – 2018-12-20 (×3): 2.5 mg via ORAL
  Filled 2018-12-18 (×3): qty 1

## 2018-12-18 MED ORDER — ADULT MULTIVITAMIN W/MINERALS CH
1.0000 | ORAL_TABLET | Freq: Every morning | ORAL | Status: DC
  Administered 2018-12-19 – 2018-12-20 (×2): 1 via ORAL
  Filled 2018-12-18 (×2): qty 1

## 2018-12-18 NOTE — ED Provider Notes (Addendum)
1540: Pt handed off to me by previous EDPA. Please see note for full details.   68 yo M with h/o CAD CABG x4 here for generalized weakness and vomiting. One episode of CP central squeezing pressure earlier today, not associated with vomiting.   Plan to treat for UTI, follow up on troponin. If abnormal/borderline troponin previous team recommend admission for CP rule out.   Physical Exam  BP (!) 163/95 (BP Location: Right Arm)   Pulse 85   Temp 98.2 F (36.8 C) (Oral)   Resp 20   Ht 5\' 9"  (1.753 m)   Wt 90.7 kg   SpO2 94%   BMI 29.53 kg/m   Physical Exam Constitutional:      Appearance: He is well-developed.  HENT:     Head: Normocephalic.     Nose: Nose normal.  Eyes:     General: Lids are normal.  Neck:     Musculoskeletal: Normal range of motion.  Cardiovascular:     Rate and Rhythm: Normal rate.  Pulmonary:     Effort: Pulmonary effort is normal. No respiratory distress.  Musculoskeletal: Normal range of motion.     Comments: S/p bilateral LE amputations   Neurological:     Mental Status: He is alert.  Psychiatric:        Behavior: Behavior normal.     ED Course/Procedures   Clinical Course as of Dec 17 1653  Mon Dec 18, 2018  1305 ALT(!): 74 [KF]  1431 Troponin I (High Sensitivity) [KF]  1524 At baseline   Hemoglobin(!): 8.5 [CG]  1525 At baseline   Creatinine(!): 2.82 [CG]  1525 Leukocytes,Ua(!): LARGE [CG]  1525 WBC, UA(!): >50 [CG]  1525 Bacteria, UA(!): RARE [CG]  1600 Troponin I (High Sensitivity)(!): 30 [CG]    Clinical Course User Index [CG] Kinnie Feil, PA-C [KF] Jacqlyn Larsen, PA-C    Procedures  MDM   1625: Evaluated pt. had substernal chest pressure that radiates to the left side of his chest while he was sitting and watching TV.  This lasted an hour and eventually resolved without intervention.  He felt like his breathing sped up and he was a little short of breath with it.  No associated neck arm pain, lightheadedness,  diaphoresis, nausea, vomiting, syncope.  He had vomited an hour prior to onset of chest pressure.  States he never gets chest discomfort or pressure on exertion.    Troponin 30.  Onset of pain greater than 3 hours ago.  Given history of CAD s/p CABG x4, stroke, hypertension, diabetes patient considered having high heart score.  Based on high-sensitivity troponin algorithm he warrants observation for serial troponins.  This could be demand ischemia.  Will consult medicine.     1640: Spoke to Dr Tamala Julian with hospitalist recommends calling patient's cardiologist Dr Terrence Dupont who usually admits his patients. Pending consult.   1655: Spoke to Dr Terrence Dupont who will come see patient and admit. Rocephin ordered. COVID test pending. Second trop pending.    Kinnie Feil, PA-C 12/18/18 1656    Virgel Manifold, MD 12/18/18 1704

## 2018-12-18 NOTE — ED Notes (Signed)
Got patient undress on the monitor patient is resting with call bell in reach 

## 2018-12-18 NOTE — ED Provider Notes (Signed)
[facility] EMERGENCY DEPARTMENT Provider Note   CSN: [id number] Arrival date & time: 12/18/18  1028     History   Chief Complaint Chief Complaint  Patient presents with   Weakness   Emesis    HPI [name] is a 68 y.o. male.     [name] is a 68 y.o. male with a history of stroke, MI, type 2 diabetes, hypertension, GERD, hyperlipidemia, bilateral BKAs, who presents to the ED via EMS for 3 days of generalized weakness with an episode of emesis this morning followed by 2 hours of left-sided chest pain.  Patient reports over the past 3 days he is just felt generally weak and unwell but did not have any other specific symptoms until this morning he had one episode of nonbloody emesis since then has not had persistent nausea, denies any associated abdominal pain and has had no diarrhea constipation or blood in the stool.  He does report that shortly after vomiting he developed left-sided chest pain described as pressure which was a constant pain that did not radiate.  He did not exert himself to know if that made pain worse but does report it seems to have improved with rest, pain has pretty much resolved upon arrival to the hospital.  He denies any associated shortness of breath.  No lightheadedness or syncope.  No associated diaphoresis.  He has bilateral BKA's, denies swelling in upper legs, no wounds over stumps.  No fevers or recent illness.  No cough, and no known sick contacts.  Patient denies dysuria but does report some frequent urination recently.  He did not take his medications this morning due to episode of vomiting.  No other aggravating or alleviating factors.     Past Medical History:  Diagnosis Date   Exertional shortness of breath    "before my last OR; I'm fine now" (09/26/2012)   GERD (gastroesophageal reflux disease)    High cholesterol    Hx of seasonal allergies    Hypertension    Myocardial infarction [facility])    Stroke ([place])  2009   memory loss   Type II diabetes mellitus ([place])     Patient Active Problem List   Diagnosis Date Noted   NSTEMI (non-ST elevated myocardial infarction) ([place]) 08/18/2018   Renal insufficiency [id number]   Chronic systolic heart failure ([place]) 10/24/2012   Diabetic gastroparesis associated with type 2 diabetes mellitus ([place]) 10/24/2012   Major depressive disorder, recurrent episode, moderate ([place]) 10/05/2012   Fever 09/26/2012   Wound eschar of foot 09/26/2012   Poor appetite 09/21/2012   Nausea with vomiting 09/21/2012   Diabetes mellitus type 2, uncontrolled, with complications ([place]) [id number]   Type II or unspecified type diabetes mellitus  09/06/2012   Acute kidney injury ([place]) 09/06/2012   Foot ulcer, right ([place]) 09/06/2012   DM (diabetes mellitus) type 2, uncontrolled, with ketoacidosis ([place]) 07/30/2012   DKA (diabetic ketoacidoses) ([place]) 07/27/2012   Sepsis secondary to UTI ([place]) 07/27/2012   Gangrene of foot Left 07/27/2012   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   H/O: CVA (cerebrovascular accident) 07/27/2012   Tobacco abuse 07/27/2012   Alcohol abuse 07/27/2012   Hypertension 07/27/2012   Anemia 07/27/2012    Past Surgical History:  Procedure Laterality Date   AMPUTATION Right 07/29/2012   Procedure: AMPUTATION 3RD TOE;  Surgeon: Kerin Salen, MD;  Location: [facility];  Service: Orthopedics;  Laterality: Right;  right third toe amputation   AMPUTATION Right 08/02/2012  Procedure: right transmetatarsal amputation;  Surgeon: Kerin Salen, MD;  Location: [facility];  Service: Orthopedics;  Laterality: Right;   AMPUTATION Right 09/06/2012   Procedure: AMPUTATION BELOW KNEE;  Surgeon: Kerin Salen, MD;  Location: [facility];  Service: Orthopedics;  Laterality: Right;   AMPUTATION Left 09/27/2012   Procedure: AMPUTATION BELOW KNEE;  Surgeon: Kerin Salen, MD;  Location: [facility];  Service: Orthopedics;  Laterality: Left;   CATARACT EXTRACTION W/ INTRAOCULAR LENS   IMPLANT, BILATERAL  ?2010   CORONARY ARTERY BYPASS GRAFT  2008   CABG X4   ESOPHAGOGASTRODUODENOSCOPY N/A 10/08/2012   Procedure: ESOPHAGOGASTRODUODENOSCOPY (EGD);  Surgeon: Winfield Cunas., MD;  Location: [facility] ENDOSCOPY;  Service: Endoscopy;  Laterality: N/A;   PERIPHERALLY INSERTED CENTRAL CATHETER INSERTION Right 09/2012   upper arm        Home Medications    Prior to Admission medications   Medication Sig Start Date End Date Taking? Authorizing Provider  amLODipine (NORVASC) 2.5 MG tablet Take 1 tablet (2.5 mg total) by mouth daily. 08/24/18   Mariel Aloe, MD  aspirin EC 81 MG tablet Take 81 mg by mouth every morning.    [provider]  atorvastatin (LIPITOR) 40 MG tablet Take 1 tablet (40 mg total) by mouth every morning. 08/23/18   Mariel Aloe, MD  Cyanocobalamin (VITAMIN B-12 PO) Take 1 tablet by mouth every morning.    [provider]  lisinopril (ZESTRIL) 20 MG tablet Please hold this medication and discuss with your primary care physician or cardiologist. 08/23/18   Mariel Aloe, MD  metoprolol succinate (TOPROL-XL) 50 MG 24 hr tablet Take 50 mg by mouth every morning. 05/31/18   [provider]  Multiple Vitamin (MULTIVITAMIN WITH MINERALS) TABS tablet Take 1 tablet by mouth every morning.    [provider]  nitroGLYCERIN (NITROSTAT) 0.4 MG SL tablet Place 0.4 mg under the tongue every 5 (five) minutes as needed for chest pain.    [provider]  tamsulosin (FLOMAX) 0.4 MG CAPS capsule Take 0.4 mg by mouth every morning. 06/11/18   [provider]    Family History History reviewed. No pertinent family history.  Social History Social History   Tobacco Use   Smoking status: Former Smoker    Packs/day: 0.12    Years: 20.00    Pack years: 2.40    Types: Cigarettes   Smokeless tobacco: Never Used  Substance Use Topics   Alcohol use: No    Comment: last drink 2010   Drug use: No     Allergies     Patient has no known allergies.   Review of Systems Review of Systems  Constitutional: Positive for fatigue. Negative for chills and fever.  Respiratory: Negative for cough and shortness of breath.   Cardiovascular: Positive for chest pain. Negative for palpitations and leg swelling.  Gastrointestinal: Positive for vomiting. Negative for abdominal pain, blood in stool, constipation, diarrhea and nausea.  Genitourinary: Positive for frequency. Negative for dysuria and flank pain.  Musculoskeletal: Negative for arthralgias and myalgias.  Skin: Negative for color change and rash.  Neurological: Positive for weakness. Negative for dizziness, syncope, speech difficulty, numbness and headaches.     Physical Exam Updated Vital Signs BP (!) 173/89    Pulse 87    Temp 98.2 F (36.8 C) (Oral)    Resp 19    Ht 5\' 9"  (1.753 m)    Wt 90.7 kg    SpO2 94%  BMI 29.53 kg/m   Physical Exam Vitals signs and nursing note reviewed.  Constitutional:      General: He is not in acute distress.    Appearance: Normal appearance. He is well-developed and normal weight. He is not ill-appearing or diaphoretic.  HENT:     Head: Normocephalic and atraumatic.     Mouth/Throat:     Mouth: Mucous membranes are moist.     Pharynx: Oropharynx is clear.  Eyes:     General:        Right eye: No discharge.        Left eye: No discharge.     Pupils: Pupils are equal, round, and reactive to light.  Neck:     Musculoskeletal: Neck supple.  Cardiovascular:     Rate and Rhythm: Normal rate and regular rhythm.     Pulses: Normal pulses.     Heart sounds: Normal heart sounds. No murmur. No friction rub. No gallop.   Pulmonary:     Effort: Pulmonary effort is normal. No respiratory distress.     Breath sounds: Normal breath sounds. No wheezing or rales.     Comments: Respirations equal and unlabored, patient able to speak in full sentences, lungs clear to auscultation bilaterally Abdominal:     General: Bowel  sounds are normal. There is no distension.     Palpations: Abdomen is soft. There is no mass.     Tenderness: There is no abdominal tenderness. There is no guarding.     Comments: Abdomen soft, nondistended, nontender to palpation in all quadrants without guarding or peritoneal signs  Musculoskeletal:     Comments: Bilateral BKA's present, no significant edema, no wounds  Skin:    General: Skin is warm and dry.     Capillary Refill: Capillary refill takes less than 2 seconds.  Neurological:     Mental Status: He is alert.     Coordination: Coordination normal.     Comments: Speech is clear, able to follow commands CN III-XII intact Normal strength in upper and lower extremities bilaterally Sensation normal to light and sharp touch Moves extremities without ataxia, coordination intact  Psychiatric:        Mood and Affect: Mood normal.        Behavior: Behavior normal.      ED Treatments / Results  Labs (all labs ordered are listed, but only abnormal results are displayed) Labs Reviewed  COMPREHENSIVE METABOLIC PANEL - Abnormal; Notable for the following components:      Result Value   Glucose, Bld 122 (*)    Creatinine, Ser 2.82 (*)    Calcium 8.6 (*)    Total Protein 6.4 (*)    Albumin 3.1 (*)    AST 58 (*)    ALT 74 (*)    GFR calc non Af Amer 22 (*)    GFR calc Af Amer 25 (*)    All other components within normal limits  CBC - Abnormal; Notable for the following components:   RBC 3.15 (*)    Hemoglobin 8.5 (*)    HCT 28.0 (*)    RDW 17.8 (*)    All other components within normal limits  LIPASE, BLOOD  URINALYSIS, ROUTINE W REFLEX MICROSCOPIC  POC OCCULT BLOOD, ED  TROPONIN I (HIGH SENSITIVITY)  TROPONIN I (HIGH SENSITIVITY)    EKG EKG Interpretation  Date/Time:  Monday December 18 2018 14:29:51 EDT Ventricular Rate:  83 PR Interval:    QRS Duration: 163 QT Interval:  458 QTC Calculation: 539 R Axis:   153 Text Interpretation:  Sinus rhythm Nonspecific  intraventricular conduction delay Minimal ST depression old RBBB, similar to old Confirmed by Charlesetta Shanks [phone]) on 12/18/2018 2:31:36 PM   Radiology Dg Chest Portable 1 View  Result Date: 12/18/2018 CLINICAL DATA:  Chest pain. EXAM: PORTABLE CHEST 1 VIEW COMPARISON:  August 18, 2018. FINDINGS: Stable cardiomegaly. Status post coronary bypass graft. No pneumothorax is noted. Mild right midlung subsegmental atelectasis is noted. Minimal left basilar subsegmental atelectasis is noted. Bony thorax is unremarkable. IMPRESSION: Mild right midlung subsegmental atelectasis with minimal left basilar subsegmental atelectasis. Aortic Atherosclerosis (ICD10-I70.0). Electronically Signed   By: Marijo Conception M.D.   On: 12/18/2018 13:24    Procedures Procedures (including critical care time)  Medications Ordered in ED Medications  sodium chloride flush (NS) 0.9 % injection 3 mL (has no administration in time range)  sodium chloride 0.9 % bolus 500 mL (500 mLs Intravenous New Bag/Given 12/18/18 1453)     Initial Impression / Assessment and Plan / ED Course  I have reviewed the triage vital signs and the nursing notes.  Pertinent labs & imaging results that were available during my care of the patient were reviewed by me and considered in my medical decision making (see chart for details).  68 year old male presents with 3 days of weakness, had one episode of emesis today and then developed 2 hours of left-sided substernal chest pain.  Chest pain has since resolved.  Pain is nonradiating, did improve with rest, nonpleuritic.  Not associated with shortness of breath, lightheadedness, syncope, cough or fever.  No abdominal pain and no continued nausea or vomiting.  No diarrhea or blood in the stool.  Will check abdominal labs given emesis and generalized weakness patient is afebrile, mildly hypertensive did not take his blood pressure medication this morning vitals otherwise reassuring.  We will also  check troponin, EKG and chest x-ray given 2-hour episode of chest tightness this morning, given patient's previous CABG, diabetes, hypertension, hyperlipidemia he is high risk with an elevated HEART score.  EKG on arrival is similar to previous with minimal ST depressions and an old right bundle branch block, no significant changes.  Labs show chronic anemia with hemoglobin of 8.8, no leukocytosis.  Given weakness Hemoccult check to ensure patient is not having slow GI bleeding leading to fatigue, this was negative.  Patient with chronic CKD, creatinine at baseline, no significant electrolyte derangements.  Minimal elevation in AST and ALT, patient has history of previous alcohol use but denies currently, has no right upper quadrant tenderness on exam.  Normal lipase.  Chest x-ray shows some mild atelectasis but is otherwise unremarkable.  At shift change urinalysis and troponins are pending.  If troponins are negative and urine is clear feel patient can be discharged home, but if troponin elevation with patient's heart score feel he would benefit from admission for chest pain rule out.  At shift change care signed out to PA Carmon Sails who will follow-up on pending lab work and disposition appropriately.  Final Clinical Impressions(s) / ED Diagnoses   Final diagnoses:  Weakness  Left-sided chest pain  Non-intractable vomiting with nausea, unspecified vomiting type    ED Discharge Orders    None       Jacqlyn Larsen, [place] 12/19/18 1608    Charlesetta Shanks, MD 12/27/18 [phone]

## 2018-12-18 NOTE — ED Triage Notes (Signed)
Pt reports 3 day hx of weakness, began vomiting this morning. Denies recent fever. Denies sick contacts. VSS. NAD at present.

## 2018-12-18 NOTE — ED Notes (Signed)
Dinner Tray ordered 

## 2018-12-18 NOTE — ED Notes (Signed)
Pt placed in hospital bed

## 2018-12-18 NOTE — H&P (Signed)
[name] is an 68 y.o. male.   Chief Complaint: Chest pain associated with nausea and vomiting HPI: Patient is a 67 year old male with past medical history significant for coronary artery disease history of MI in the past status post CABG, hypertension, diabetes mellitus, history of CVA, peripheral vascular disease status post bilateral below-knee amputation, hyperlipidemia, chronic kidney disease stage IV, anemia of chronic disease, GERD, ischemic cardiomyopathy, came to ER complaining of retrosternal chest pain described as tightness associated with nausea and vomiting and mild shortness of breath.  Received 1 sublingual nitro earlier today with relief of chest pain.  Denies PND orthopnea.  EKG done in the ED showed normal sinus rhythm with right bundle branch block with secondary ST-T wave changes 2 sets of high-sensitivity troponin very minimally elevated, and also was noted to have chronically elevated creatinine.  Patient was also noted to have UTI and was started on Rocephin in the ED.  Patient presently denies any chest pains or shortness of breath.  Denies any fever or chills.  Past Medical History:  Diagnosis Date  . Exertional shortness of breath    "before my last OR; I'm fine now" (09/26/2012)  . GERD (gastroesophageal reflux disease)   . High cholesterol   . Hx of seasonal allergies   . Hypertension   . Myocardial infarction ([place])   . Stroke [facility]) 2009   memory loss  . Type II diabetes mellitus ([place])     Past Surgical History:  Procedure Laterality Date  . AMPUTATION Right 07/29/2012   Procedure: AMPUTATION 3RD TOE;  Surgeon: Kerin Salen, MD;  Location: [place];  Service: Orthopedics;  Laterality: Right;  right third toe amputation  . AMPUTATION Right 08/02/2012   Procedure: right transmetatarsal amputation;  Surgeon: Kerin Salen, MD;  Location: [place];  Service: Orthopedics;  Laterality: Right;  . AMPUTATION Right 09/06/2012   Procedure: AMPUTATION BELOW KNEE;  Surgeon:  Kerin Salen, MD;  Location: [place];  Service: Orthopedics;  Laterality: Right;  . AMPUTATION Left 09/27/2012   Procedure: AMPUTATION BELOW KNEE;  Surgeon: Kerin Salen, MD;  Location: [place];  Service: Orthopedics;  Laterality: Left;  . CATARACT EXTRACTION W/ INTRAOCULAR LENS  IMPLANT, BILATERAL  ?2010  . 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 ESOPHAGOGASTRODUODENOSCOPY N/A 10/08/2012   Procedure: ESOPHAGOGASTRODUODENOSCOPY (EGD);  Surgeon: Winfield Cunas., MD;  Location: [facility];  Service: Endoscopy;  Laterality: N/A;  . PERIPHERALLY INSERTED CENTRAL CATHETER INSERTION Right 09/2012   upper arm    History reviewed. No pertinent family history. Social History:  reports that he has quit smoking. His smoking use included cigarettes. He has a 2.40 pack-year smoking history. He has never used smokeless tobacco. He reports that he does not drink alcohol or use drugs.  Allergies: No Known Allergies  (Not in a hospital admission)   Results for orders placed or performed during the hospital encounter of 12/18/18 (from the past 48 hour(s))  Lipase, blood     Status: None   Collection Time: 12/18/18 11:33 AM  Result Value Ref Range   Lipase 25 11 - 51 U/L    Comment: Performed at [facility], [address], [place], [place] [address]  Comprehensive metabolic panel     Status: Abnormal   Collection Time: 12/18/18 11:33 AM  Result Value Ref Range   Sodium 138 135 - 145 mmol/L   Potassium 5.0 3.5 - 5.1 mmol/L    Comment:  HEMOLYSIS AT THIS LEVEL MAY AFFECT RESULT   Chloride 103 98 - 111 mmol/L   CO2 24 22 - 32 mmol/L   Glucose, Bld 122 (H) 70 - 99 mg/dL   BUN 23 8 - 23 mg/dL   Creatinine, Ser 2.82 (H) 0.61 - 1.24 mg/dL   Calcium 8.6 (L) 8.9 - 10.3 mg/dL   Total Protein 6.4 (L) 6.5 - 8.1 g/dL   Albumin 3.1 (L) 3.5 - 5.0 g/dL   AST 58 (H) 15 - 41 U/L   ALT 74 (H) 0 - 44 U/L   Alkaline Phosphatase 125 38 - 126 U/L   Total Bilirubin 1.2 0.3 - 1.2 mg/dL   GFR calc non  Af Amer 22 (L) >60 mL/min   GFR calc Af Amer 25 (L) >60 mL/min   Anion gap 11 5 - 15    Comment: Performed at [facility], [address], [place], [place] [address]  CBC     Status: Abnormal   Collection Time: 12/18/18 11:33 AM  Result Value Ref Range   WBC 4.7 4.0 - 10.5 K/uL   RBC 3.15 (L) 4.22 - 5.81 MIL/uL   Hemoglobin 8.5 (L) 13.0 - 17.0 g/dL   HCT 28.0 (L) 39.0 - 52.0 %   MCV 88.9 80.0 - 100.0 fL   MCH 27.0 26.0 - 34.0 pg   MCHC 30.4 30.0 - 36.0 g/dL   RDW 17.8 (H) 11.5 - 15.5 %   Platelets 171 150 - 400 K/uL   nRBC 0.0 0.0 - 0.2 %    Comment: Performed at [facility], [place] [address], [place], [place] [address]  POC occult blood, ED Provider will collect     Status: None   Collection Time: 12/18/18  2:02 PM  Result Value Ref Range   Fecal Occult Bld NEGATIVE NEGATIVE  Urinalysis, Routine w reflex microscopic     Status: Abnormal   Collection Time: 12/18/18  2:38 PM  Result Value Ref Range   Color, Urine YELLOW YELLOW   APPearance HAZY (A) CLEAR   Specific Gravity, Urine 1.005 1.005 - 1.030   pH 8.0 5.0 - 8.0   Glucose, UA NEGATIVE NEGATIVE mg/dL   Hgb urine dipstick NEGATIVE NEGATIVE   Bilirubin Urine NEGATIVE NEGATIVE   Ketones, ur NEGATIVE NEGATIVE mg/dL   Protein, ur 100 (A) NEGATIVE mg/dL   Nitrite NEGATIVE NEGATIVE   Leukocytes,Ua LARGE (A) NEGATIVE   RBC / HPF 0-5 0 - 5 RBC/hpf   WBC, UA >50 (H) 0 - 5 WBC/hpf   Bacteria, UA RARE (A) NONE SEEN    Comment: Performed at [facility], [address], [place], [place] [address]  Troponin I (High Sensitivity)     Status: Abnormal   Collection Time: 12/18/18  2:39 PM  Result Value Ref Range   Troponin I (High Sensitivity) 30 (H) <18 ng/L    Comment: (NOTE) Elevated high sensitivity troponin I (hsTnI) values and significant  changes across serial measurements may suggest ACS but many other  chronic and acute conditions are known to elevate hsTnI results.  Refer to the "Links" section for chest  pain algorithms and additional  guidance. Performed at [facility], [place] [address], [place], [place] [address]   Troponin I (High Sensitivity)     Status: Abnormal   Collection Time: 12/18/18  3:50 PM  Result Value Ref Range   Troponin I (High Sensitivity) 30 (H) <18 ng/L    Comment: (NOTE) Elevated  high sensitivity troponin I (hsTnI) values and significant  changes across serial measurements may suggest ACS but many other  chronic and acute conditions are known to elevate hsTnI results.  Refer to the "Links" section for chest pain algorithms and additional  guidance. Performed at [facility], [place] [address] [place], [place] [address]    Dg Chest Portable 1 View  Result Date: 12/18/2018 CLINICAL DATA:  Chest pain. EXAM: PORTABLE CHEST 1 VIEW COMPARISON:  August 18, 2018. FINDINGS: Stable cardiomegaly. Status post coronary bypass graft. No pneumothorax is noted. Mild right midlung subsegmental atelectasis is noted. Minimal left basilar subsegmental atelectasis is noted. Bony thorax is unremarkable. IMPRESSION: Mild right midlung subsegmental atelectasis with minimal left basilar subsegmental atelectasis. Aortic Atherosclerosis (ICD10-I70.0). Electronically Signed   By: Marijo Conception M.D.   On: 12/18/2018 13:24    Review of Systems  Constitutional: Positive for malaise/fatigue. Negative for chills, diaphoresis and fever.  HENT: Negative for hearing loss.   Eyes: Negative for blurred vision.  Respiratory: Positive for shortness of breath. Negative for cough, hemoptysis and sputum production.   Cardiovascular: Positive for chest pain. Negative for orthopnea.  Gastrointestinal: Positive for nausea and vomiting. Negative for abdominal pain.  Genitourinary: Negative for dysuria.  Neurological: Negative for dizziness.    Blood pressure (!) 163/95, pulse 85, temperature 98.2 F (36.8 C), temperature source Oral, resp. rate 20, height 5\' 9"  (1.753 m), weight 90.7 kg, SpO2 94  %. Physical Exam  Constitutional: He is oriented to person, place, and time.  Eyes: Conjunctivae are normal. Left eye exhibits no discharge. No scleral icterus.  Neck: Normal range of motion. Neck supple. No JVD present. No tracheal deviation present. No thyromegaly present.  Cardiovascular: Normal rate and regular rhythm.  Murmur (3/6 systolic murmur noted) heard. Respiratory:  Decreased breath sound at bases  GI: Soft. Bowel sounds are normal. He exhibits no distension. There is no abdominal tenderness. There is no rebound.  Musculoskeletal:     Comments: Bilateral below-knee amputation noted  Neurological: He is alert and oriented to person, place, and time.     Assessment/Plan Acute coronary syndrome Coronary artery disease history of MI in the past status post CABG x4 in 2008 Ischemic cardiomyopathy Mitral regurgitation Hypertension Diabetes mellitus Peripheral vascular disease status post bilateral BKA Hyperlipidemia UTI Anemia of chronic disease Chronic kidney disease stage IV Plan As per orders Discussed with patient at length various options of treatment i.e. medical versus invasive left cath possible PTCA stenting its risk and benefits i.e. death MI stroke need for emergency CABG local vascular complications worsening renal function requiring hemodialysis etc. patient wanted to be treated medically only.  Charolette Forward, MD 12/18/2018, 5:30 PM

## 2018-12-19 ENCOUNTER — Other Ambulatory Visit: Payer: Self-pay

## 2018-12-19 LAB — BASIC METABOLIC PANEL
Anion gap: 11 (ref 5–15)
BUN: 23 mg/dL (ref 8–23)
CO2: 24 mmol/L (ref 22–32)
Calcium: 8.8 mg/dL — ABNORMAL LOW (ref 8.9–10.3)
Chloride: 104 mmol/L (ref 98–111)
Creatinine, Ser: 2.7 mg/dL — ABNORMAL HIGH (ref 0.61–1.24)
GFR calc Af Amer: 27 mL/min — ABNORMAL LOW (ref >60)
GFR calc non Af Amer: 23 mL/min — ABNORMAL LOW (ref >60)
Glucose, Bld: 131 mg/dL — ABNORMAL HIGH (ref 70–99)
Potassium: 4.6 mmol/L (ref 3.5–5.1)
Sodium: 139 mmol/L (ref 135–145)

## 2018-12-19 LAB — CBC
HCT: 26.6 % — ABNORMAL LOW (ref 39.0–52.0)
Hemoglobin: 8.4 g/dL — ABNORMAL LOW (ref 13.0–17.0)
MCH: 27.6 pg (ref 26.0–34.0)
MCHC: 31.6 g/dL (ref 30.0–36.0)
MCV: 87.5 fL (ref 80.0–100.0)
Platelets: 173 10*3/uL (ref 150–400)
RBC: 3.04 MIL/uL — ABNORMAL LOW (ref 4.22–5.81)
RDW: 17.6 % — ABNORMAL HIGH (ref 11.5–15.5)
WBC: 5.7 10*3/uL (ref 4.0–10.5)
nRBC: 0 % (ref 0.0–0.2)

## 2018-12-19 LAB — GLUCOSE, CAPILLARY
Glucose-Capillary: 151 mg/dL — ABNORMAL HIGH (ref 70–99)
Glucose-Capillary: 123 mg/dL — ABNORMAL HIGH (ref 70–99)
Glucose-Capillary: 122 mg/dL — ABNORMAL HIGH (ref 70–99)
Glucose-Capillary: 108 mg/dL — ABNORMAL HIGH (ref 70–99)
Glucose-Capillary: 113 mg/dL — ABNORMAL HIGH (ref 70–99)

## 2018-12-19 LAB — LIPID PANEL
Cholesterol: 112 mg/dL (ref 0–200)
HDL: 31 mg/dL — ABNORMAL LOW (ref >40)
LDL Cholesterol: 70 mg/dL (ref 0–99)
Total CHOL/HDL Ratio: 3.6 RATIO
Triglycerides: 54 mg/dL (ref <150)
VLDL: 11 mg/dL (ref 0–40)

## 2018-12-19 MED ORDER — INFLUENZA VAC A&B SA ADJ QUAD 0.5 ML IM PRSY
0.5000 mL | PREFILLED_SYRINGE | INTRAMUSCULAR | Status: AC
  Administered 2018-12-20: 0.5 mL via INTRAMUSCULAR
  Filled 2018-12-19: qty 0.5

## 2018-12-19 NOTE — Progress Notes (Signed)
   12/18/18 2243  Vitals  Temp 98.7 F (37.1 C)  Temp Source Oral  BP 131/75  MAP (mmHg) 92  BP Location Left Arm  BP Method Automatic  Patient Position (if appropriate) Lying  Pulse Rate 80  Pulse Rate Source Monitor  Resp 18  Oxygen Therapy  SpO2 99 %  O2 Device Room Air  MEWS Score  MEWS RR 0  MEWS Pulse 0  MEWS Systolic 0  MEWS LOC 0  MEWS Temp 0  MEWS Score 0  MEWS Score Color Green  Admitted Pt from ED to room 3E08, pt alert and oriented, denied pain at this time, oriented to room, call bell placed within reach. Placed on cardiac monitor, CCMD made aware.  Pt is bilateral BKA, prosthesis at bedside. Safety precautions in placed. Bed alarm activated.

## 2018-12-19 NOTE — Progress Notes (Addendum)
Subjective:  Denies any chest pain or shortness of breath.  Overall feels better and denies any urinary complaints.  High-sensitivity troponin I minimally elevated.patient has not ambulated at all remained bedbound due to bilateral below-knee amputation.  Objective:  Vital Signs in the last 24 hours: Temp:  [98 F (36.7 C)-98.7 F (37.1 C)] 98.2 F (36.8 C) (10/13 0813) Pulse Rate:  [65-87] 72 (10/13 0813) Resp:  [13-28] 18 (10/13 0813) BP: (122-173)/(75-99) 133/84 (10/13 0813) SpO2:  [92 %-100 %] 98 % (10/13 0813) Weight:  [86.9 kg-90.7 kg] 86.9 kg (10/13 0520)  Intake/Output from previous day: 10/12 0701 - 10/13 0700 In: 478.5 [P.O.:240; I.V.:38.5; IV Piggyback:200] Out: 200 [Urine:200] Intake/Output from this shift: Total I/O In: 220 [P.O.:220] Out: 100 [Urine:100]  Physical Exam: Neck: no adenopathy, no carotid bruit, no JVD and supple, symmetrical, trachea midline Lungs: Clear to auscultation anterolaterally Heart: regular rate and rhythm, S1, S2 normal and 2/6 systolic murmur noted Abdomen: soft, non-tender; bowel sounds normal; no masses,  no organomegaly Extremities: Bilateral BKA noted  Lab Results: Recent Labs    12/18/18 1945 12/19/18 0454  WBC 5.2 5.7  HGB 8.8* 8.4*  PLT 183 173   Recent Labs    12/18/18 1133 12/18/18 1945 12/19/18 0454  NA 138  --  139  K 5.0  --  4.6  CL 103  --  104  CO2 24  --  24  GLUCOSE 122*  --  131*  BUN 23  --  23  CREATININE 2.82* 2.70* 2.70*   No results for input(s): TROPONINI in the last 72 hours.  Invalid input(s): CK, MB Hepatic Function Panel Recent Labs    12/18/18 1133  PROT 6.4*  ALBUMIN 3.1*  AST 58*  ALT 74*  ALKPHOS 125  BILITOT 1.2   Recent Labs    12/19/18 0454  CHOL 112   No results for input(s): PROTIME in the last 72 hours.  Imaging: Imaging results have been reviewed and Dg Chest Portable 1 View  Result Date: 12/18/2018 CLINICAL DATA:  Chest pain. EXAM: PORTABLE CHEST 1 VIEW  COMPARISON:  August 18, 2018. FINDINGS: Stable cardiomegaly. Status post coronary bypass graft. No pneumothorax is noted. Mild right midlung subsegmental atelectasis is noted. Minimal left basilar subsegmental atelectasis is noted. Bony thorax is unremarkable. IMPRESSION: Mild right midlung subsegmental atelectasis with minimal left basilar subsegmental atelectasis. Aortic Atherosclerosis (ICD10-I70.0). Electronically Signed   By: Marijo Conception M.D.   On: 12/18/2018 13:24    Cardiac Studies:  Assessment/Plan:  Acute coronary syndrome with minimally elevated troponin probably secondary to chronic kidney disease doubt significant MI Coronary artery disease history of MI in the past status post CABG x4 in 2008 Ischemic cardiomyopathy Mitral regurgitation Hypertension Diabetes mellitus Peripheral vascular disease status post bilateral BKA Hyperlipidemia UTI Anemia of chronic disease Chronic kidney disease stage IV Plan Continue present management Check urine culture results Ambulate patient as tolerated with prosthesis.  If no further chest pain.  Will discharge home tomorrow.  As patient wanted to be treated medically only.   LOS: 1 day    Charolette Forward 12/19/2018, 9:38 AM

## 2018-12-20 DIAGNOSIS — Z23 Encounter for immunization: Secondary | ICD-10-CM | POA: Diagnosis not present

## 2018-12-20 LAB — GLUCOSE, CAPILLARY
Glucose-Capillary: 94 mg/dL (ref 70–99)
Glucose-Capillary: 144 mg/dL — ABNORMAL HIGH (ref 70–99)

## 2018-12-20 MED ORDER — CIPROFLOXACIN HCL 250 MG PO TABS: 250.0000 mg | ORAL_TABLET | Freq: Two times a day (BID) | ORAL | 0 refills | Status: DC

## 2018-12-20 MED ORDER — ISOSORBIDE MONONITRATE ER 30 MG PO TB24: 30.0000 mg | ORAL_TABLET | Freq: Every day | ORAL | 11 refills | Status: DC

## 2018-12-20 MED ORDER — FERROUS SULFATE 325 (65 FE) MG PO TBEC: 325.0000 mg | DELAYED_RELEASE_TABLET | Freq: Two times a day (BID) | ORAL | 3 refills | Status: DC

## 2018-12-20 MED ORDER — PANTOPRAZOLE SODIUM 40 MG PO TBEC: 40.0000 mg | DELAYED_RELEASE_TABLET | Freq: Every day | ORAL | 3 refills | Status: AC

## 2018-12-20 NOTE — Discharge Summary (Signed)
NAME: [name], [name] RECORD [id number] ACCOUNT [id number] DATE OF BIRTH:05-Apr-1950 FACILITY: [facility] LOCATION: [facility] PHYSICIAN:Chantavia Bazzle Daivd Council, MD  DISCHARGE SUMMARY  DATE OF DISCHARGE:  12/20/2018  ADMITTING DIAGNOSES: 1.  Acute coronary syndrome. 2.  Coronary artery disease, history of myocardial infarction in the past, status post coronary artery bypass grafting x4 in 2008. 3.  Ischemic cardiomyopathy. 4.  Mitral regurgitation. 5.  Hypertension. 6.  Diabetes mellitus. 7.  Peripheral vascular disease, status post bilateral below-knee amputation. 8.  Hyperlipidemia. 9.  Urinary tract infection. 10.  Anemia of chronic disease. 11.  Chronic kidney disease, stage IV.  FINAL DIAGNOSES: 1.  Acute coronary syndrome with minimally elevated troponin I attributed to chronic kidney disease.  Doubt significant myocardial infarction. 2.  Stable angina. 3.  Coronary artery disease, history of inferior wall myocardial infarction in the recent past, status post coronary artery bypass grafting x4 in 2008, treated medically. 4.  Ischemic cardiomyopathy. 5.  Mitral regurgitation. 6.  Hypertension. 7.  Diabetes mellitus, controlled by diet. 8.  Peripheral vascular disease, status post bilateral below-knee amputation. 9.  Hyperlipidemia. 10.  Resolving urinary tract infection. 11.  Anemia of chronic disease. 12.  Chronic kidney disease stage IV.  DISCHARGE HOME MEDICATIONS: 1.  Ciprofloxacin 250 mg 1 tablet twice daily for 5 more days. 2.  Ferrous sulfate 325 mg 1 tablet twice daily. 3.  Isosorbide mononitrate 30 mg daily. 4.  Pantoprazole 40 mg daily. 5.  Continue other home medications; i.e., amlodipine 2.5 mg 1 tablet daily. 6.  Aspirin 81 mg 1 tablet daily. 7.  Atorvastatin 40 mg daily. 8.  Metoprolol succinate 50 mg daily. 9.  Multivitamin with minerals 1 tablet daily. 10.  Nitrostat 0.4 mg sublingual p.r.n. 11.  Flomax 0.4 mg daily. 12.  Vitamin B12 one tablet  daily.  DIET:  Low-salt, low-cholesterol, 1800-calorie ADA/renal diet.  The patient has been advised to monitor blood pressure regularly and chart.  Follow up with me in 1 week.  CONDITION AT DISCHARGE:  Stable.  We will hold off on ACE inhibitor in view of significant renal insufficiency.  We will follow renal function as outpatient and consider restarting ACE inhibitor if renal function remains stable and improved.  BRIEF HISTORY AND HOSPITAL COURSE:  The patient is a 68 year old male with past medical history significant for coronary artery disease, history of MI in the past, status post CABG, history of inferior wall MI in June of 2020 treated medically,  hypertension, diabetes mellitus, history of CVA, peripheral vascular disease, status post bilateral below-knee amputation, hyperlipidemia, chronic kidney disease stage IV, anemia of chronic disease, GERD, ischemic cardiomyopathy.  He came to the ER  complaining of retrosternal chest pain described as tightness associated with nausea, vomiting, and mild shortness of breath.  The patient received 1 sublingual nitro earlier today with relief of chest pain.  Denies PND, orthopnea.  EKG done in the ED  showed normal sinus rhythm with right bundle branch block with secondary ST-T wave changes.  Two sets of high-sensitivity troponins were minimally elevated.  The patient was also noted to have chronically elevated creatinine.  The patient also was noted  to have a UTI and was started on Rocephin in the ED.  The patient presently denies any chest pain or shortness of breath.  Denies any fever or chills.  PHYSICAL EXAMINATION: GENERAL:  He was alert, awake, oriented x3. VITAL SIGNS:  Blood pressure was 163/95, pulse 85.  He was afebrile. HEENT:  Conjunctivae pink. NECK:  Supple,  no JVD, no bruit. LUNGS:  Decreased breath sounds at bases. CARDIOVASCULAR:  S1, S2 was normal.  There was III/VI systolic murmur. ABDOMEN:  Soft.  Bowel sounds are present,  nontender. EXTREMITIES:  Bilateral below-knee amputation noted. NEUROLOGIC:  Grossly intact.  LABORATORY DATA:  Sodium was 138, potassium 5, BUN was 23, creatinine 2.82.  Blood sugar was 122.  Hemoglobin was 8.5, hematocrit 28, white count of 4.7.  Urinalysis:  Urine was hazy.  There were large leukocytes and WBCs, more than 50.  Urine culture is  still pending.  Stool for occult blood was negative.  Hemoglobin A1c was 5.4.  EKG showed normal sinus rhythm with right bundle branch block pattern, old.  Cannot rule out old inferior wall MI.  Repeat electrolytes:  Sodium 139, potassium 4.6, BUN 23,  creatinine 2.70.  His cholesterol was 112. HDL was low at 31, LDL 70.  Repeat hemoglobin was 8.4, hematocrit 36.6, white count of 5.7, which has been stable.  Troponin I were 30, 30, 35, and 28, which is trending down and is minimally elevated.  BRIEF HOSPITAL COURSE:  The patient was admitted to telemetry unit.  The patient did not have any further episodes of chest pain during the hospital stay.  Discussed with the patient at length various options of treatment.  The patient absolutely wanted  to be treated medically in view of chronic kidney insufficiency and risk of requiring hemodialysis.  The patient did not have any further episodes of chest pain during the hospital stay.  The patient remained afebrile.  His IV antibiotics have been  switched to oral ciprofloxacin, and we will follow his urine culture as outpatient.  DISCHARGE INSTRUCTIONS:  The patient will be discharged home on above medications and will be followed up in my office in 1 week.  LN/NUANCE D:12/20/2018 T:12/20/2018 JOB:[id number]

## 2018-12-20 NOTE — Progress Notes (Signed)
Patient alert and oriented, denies pain, iv and tele d/c. Discharge instruction explain and given to the patient. All questions answered.

## 2018-12-20 NOTE — Consult Note (Signed)
   [facility] CM Inpatient Consult   12/20/2018  [name] 1950/03/18 [id number]    Patient was checked forpotentialTHN Care Management servicesneeds under his [facility] plan, with 18% risk score for unplanned readmission and hospitalization.  Per MD history and physical dated 10/12//20, show as: Patient is a 68 year old male with past medical history significant for coronary artery disease, (Acute coronary syndrome attributed to chronic kidney disease. Doubt significant myocardial infarction).  Patient also was noted to have a UTI.   Per medical record review, found that patient isNOT currently a beneficiary of the attributed [place] in the [employer].  Reason:Hiscurrent primary care provider is Dr. Boneta Lucks with [facility], who isnot a [facility] provider and not affiliated with [employer]  This patient is currently Noteligible for [facility] Care Management Services. Membership roster used to verify non-eligible status. Also called and confirmed with Sadie Haber at [facility] (Dr. Gaynelle Arabian) and spoke to [place] who reported that patient is not currently seen in their practice.   For questions, please contact:  Edwena Felty A. Tekeya Geffert, BSN, RN-BC [facility] Liaison Cell: [phone]

## 2018-12-20 NOTE — Discharge Summary (Signed)
Discharge summary dictated on 12/20/2018 dictation number is [phone]

## 2018-12-20 NOTE — Discharge Instructions (Signed)
Acute Coronary Syndrome °Acute coronary syndrome (ACS) is a serious problem in which there is suddenly not enough blood and oxygen reaching the heart. ACS can result in chest pain or a heart attack. °This condition is a medical emergency. If you have any symptoms of this condition, get help right away. °What are the causes? °This condition may be caused by: °· A buildup of fat and cholesterol inside the arteries (atherosclerosis). This is the most common cause. The buildup (plaque) can cause blood vessels in the heart (coronary arteries) to become narrow or blocked, which reduces blood flow to the heart. Plaque can also break off and lead to a clot, which can block an artery and cause a heart attack or stroke. °· Sudden tightening of the muscles around the coronary arteries (coronary spasm). °· Tearing of a coronary artery (spontaneous coronary artery dissection). °· Very low blood pressure (hypotension). °· An abnormal heartbeat (arrhythmia). °· Other medical conditions that cause a decrease of oxygen to the heart, such as anemiaorrespiratory failure. °· Using cocaine or methamphetamine. °What increases the risk? °The following factors may make you more likely to develop this condition: °· Age. The risk for ACS increases as you get older. °· History of chest pain, heart attack, peripheral artery disease, or stroke. °· Having taken chemotherapy or immune-suppressing medicines. °· Being male. °· Family history of chest pain, heart disease, or stroke. °· Smoking. °· Not exercising enough. °· Being overweight. °· High cholesterol. °· High blood pressure (hypertension). °· Diabetes. °· Excessive alcohol use. °What are the signs or symptoms? °Common symptoms of this condition include: °· Chest pain. The pain may last a long time, or it may stop and come back (recur). It may feel like: °? Crushing or squeezing. °? Tightness, pressure, fullness, or heaviness. °· Arm, neck, jaw, or back pain. °· Heartburn or  indigestion. °· Shortness of breath. °· Nausea. °· Sudden cold sweats. °· Light-headedness. °· Dizziness or passing out. °· Tiredness (fatigue). °Sometimes there are no symptoms. °How is this diagnosed? °This condition may be diagnosed based on: °· Your medical history and symptoms. °· Imaging tests, such as: °? An electrocardiogram (ECG). This measures the heart's electrical activity. °? X-rays. °? CT scan. °? A coronary angiogram. For this test, dye is injected into the heart arteries and then X-rays are taken. °? Myocardial perfusion imaging. This test shows how well blood flows through your heart muscle. °· Blood tests. These may be repeated at certain time intervals. °· Exercise stress testing. °· Echocardiogram. This is a test that uses sound waves to produce detailed images of the heart. °How is this treated? °Treatment for this condition may include: °· Oxygen therapy. °· Medicines, such as: °? Antiplatelet medicines and blood-thinning medicines, such as aspirin. These help prevent blood clots. °? Medicine that dissolves any blood clots (fibrinolytic therapy). °? Blood pressure medicines. °? Nitroglycerin. This helps widen blood vessels to improve blood flow. °? Pain medicine. °? Cholesterol-lowering medicine. °· Surgery, such as: °? Coronary angioplasty with stent placement. This involves placing a small piece of metal that looks like mesh or a spring into a narrow coronary artery. This widens the artery and keeps it open. °? Coronary artery bypass surgery. This involves taking a section of a blood vessel from a different part of your body and placing it on the blocked coronary artery to allow blood to flow around the blockage. °· Cardiac rehabilitation. This is a program that includes exercise training, education, and counseling to help you recover. °  Follow these instructions at home: °Eating and drinking °· Eat a heart-healthy diet that includes whole grains, fruits and vegetables, lean proteins, and  low-fat or nonfat dairy products. °· Limit how much salt (sodium) you eat as told by your health care provider. Follow instructions from your health care provider about any other eating or drinking restrictions, such as limiting foods that are high in fat and processed sugars. °· Use healthy cooking methods such as roasting, grilling, broiling, baking, poaching, steaming, or stir-frying. °· Work with a dietitian to follow a heart-healthy eating plan. °Medicines °· Take over-the-counter and prescription medicines only as told by your health care provider. °· Do not take these medicines unless your health care provider approves: °? Vitamin supplements that contain vitamin A or vitamin E. °? NSAIDs, such as ibuprofen, naproxen, or celecoxib. °? Hormone replacement therapy that contains estrogen. °· If you are taking blood thinners: °? Talk with your health care provider before you take any medicines that contain aspirin or NSAIDs. These medicines increase your risk for dangerous bleeding. °? Take your medicine exactly as told, at the same time every day. °? Avoid activities that could cause injury or bruising, and follow instructions about how to prevent falls. °? Wear a medical alert bracelet, and carry a card that lists what medicines you take. °Activity °· Follow your cardiac rehabilitation program. Do exercises as told by your physical therapist. °· Ask your health care provider what activities and exercises are safe for you. Follow his or her instructions about lifting, driving, or climbing stairs. °Lifestyle °· Do not use any products that contain nicotine or tobacco, such as cigarettes, e-cigarettes, and chewing tobacco. If you need help quitting, ask your health care provider. °· Do not drink alcohol if your health care provider tells you not to drink. °· If you drink alcohol: °? Limit how much you have to 0-1 drink a day. °? Be aware of how much alcohol is in your drink. In the [place] one drink equals one 12 oz  bottle of beer (355 mL), one 5 oz glass of wine (148 mL), or one 1½ oz glass of hard liquor (44 mL). °· Maintain a healthy weight. If you need to lose weight, work with your health care provider to do so safely. °General instructions °· Tell all the health care providers who provide care for you about your heart condition, including your dentist. This may affect the medicines or treatment you receive. °· Manage any other health conditions you have, such as hypertension or diabetes. These conditions affect your heart. °· Pay attention to your mental health. You may be at higher risk for depression. °? Find ways to manage stress. °? Talk to your health care provider about depression screening and treatment. °· Keep your vaccinations up to date. °? Get the flu shot (influenza vaccine) every year. °? Get the pneumococcal vaccine if you are age 65 or older. °· If directed, monitor your blood pressure at home. °· Keep all follow-up visits as told by your health care provider. This is important. °Contact a health care provider if you: °· Feel overwhelmed or sad. °· Have trouble doing your daily activities. °Get help right away if you: °· Have pain in your chest, neck, arm, jaw, stomach, or back that recurs, and: °? It lasts for more than a few minutes. °? It is not relieved by taking the medicineyour health care provider prescribed. °· Have unexplained: °? Heavy sweating. °? Heartburn or indigestion. °? Nausea or vomiting. °?   Shortness of breath. °? Difficulty breathing. °? Fatigue. °? Nervousness or anxiety. °? Weakness. °? Diarrhea. °? Dark stools or blood in your stool. °· Have sudden light-headedness or dizziness. °· Have blood pressure that is higher than 180/120. °· Faint. °· Have thoughts about hurting yourself. °These symptoms may represent a serious problem that is an emergency. Do not wait to see if the symptoms will go away. Get medical help right away. Call your local emergency services (911 in the [place] Do  not drive yourself to the hospital.  °Summary °· Acute coronary syndrome (ACS) is when there is not enough blood and oxygen being supplied to the heart. ACS can result in chest pain or a heart attack. °· Acute coronary syndrome is a medical emergency. If you have any symptoms of this condition, get help right away. °· Treatment includes medicines and procedures to open the blocked arteries and restore blood flow. °This information is not intended to replace advice given to you by your health care provider. Make sure you discuss any questions you have with your health care provider. °Document Released: 02/22/2005 Document Revised: 03/06/2018 Document Reviewed: 03/06/2018 °Elsevier Patient Education © 2020 [employer] ° °

## 2018-12-21 LAB — URINE CULTURE: Culture: <10000 — AB

## 2018-12-29 DIAGNOSIS — I2510 Atherosclerotic heart disease of native coronary artery without angina pectoris: Secondary | ICD-10-CM | POA: Diagnosis not present

## 2018-12-29 DIAGNOSIS — I10 Essential (primary) hypertension: Secondary | ICD-10-CM | POA: Diagnosis not present

## 2018-12-29 DIAGNOSIS — F17290 Nicotine dependence, other tobacco product, uncomplicated: Secondary | ICD-10-CM | POA: Diagnosis not present

## 2018-12-29 DIAGNOSIS — N189 Chronic kidney disease, unspecified: Secondary | ICD-10-CM | POA: Diagnosis not present

## 2018-12-29 DIAGNOSIS — I739 Peripheral vascular disease, unspecified: Secondary | ICD-10-CM | POA: Diagnosis not present

## 2018-12-29 DIAGNOSIS — E785 Hyperlipidemia, unspecified: Secondary | ICD-10-CM | POA: Diagnosis not present

## 2018-12-29 DIAGNOSIS — E119 Type 2 diabetes mellitus without complications: Secondary | ICD-10-CM | POA: Diagnosis not present

## 2019-01-29 ENCOUNTER — Ambulatory Visit (INDEPENDENT_AMBULATORY_CARE_PROVIDER_SITE_OTHER): Payer: Medicare HMO | Admitting: Internal Medicine

## 2019-01-29 ENCOUNTER — Encounter: Payer: Self-pay | Admitting: Internal Medicine

## 2019-01-29 ENCOUNTER — Other Ambulatory Visit: Payer: Self-pay

## 2019-01-29 VITALS — BP 138/80 | HR 92 | Temp 98.1°F | Ht 70.0 in | Wt 209.7 lb

## 2019-01-29 DIAGNOSIS — Z0000 Encounter for general adult medical examination without abnormal findings: Secondary | ICD-10-CM

## 2019-01-29 DIAGNOSIS — E118 Type 2 diabetes mellitus with unspecified complications: Secondary | ICD-10-CM

## 2019-01-29 DIAGNOSIS — R011 Cardiac murmur, unspecified: Secondary | ICD-10-CM

## 2019-01-29 DIAGNOSIS — E1151 Type 2 diabetes mellitus with diabetic peripheral angiopathy without gangrene: Secondary | ICD-10-CM

## 2019-01-29 DIAGNOSIS — IMO0002 Reserved for concepts with insufficient information to code with codable children: Secondary | ICD-10-CM

## 2019-01-29 DIAGNOSIS — Z7189 Other specified counseling: Secondary | ICD-10-CM | POA: Insufficient documentation

## 2019-01-29 DIAGNOSIS — I5022 Chronic systolic (congestive) heart failure: Secondary | ICD-10-CM

## 2019-01-29 DIAGNOSIS — R112 Nausea with vomiting, unspecified: Secondary | ICD-10-CM

## 2019-01-29 DIAGNOSIS — I255 Ischemic cardiomyopathy: Secondary | ICD-10-CM

## 2019-01-29 DIAGNOSIS — N184 Chronic kidney disease, stage 4 (severe): Secondary | ICD-10-CM

## 2019-01-29 DIAGNOSIS — Z23 Encounter for immunization: Secondary | ICD-10-CM

## 2019-01-29 DIAGNOSIS — Z89512 Acquired absence of left leg below knee: Secondary | ICD-10-CM

## 2019-01-29 DIAGNOSIS — D649 Anemia, unspecified: Secondary | ICD-10-CM

## 2019-01-29 DIAGNOSIS — I10 Essential (primary) hypertension: Secondary | ICD-10-CM

## 2019-01-29 DIAGNOSIS — Z8673 Personal history of transient ischemic attack (TIA), and cerebral infarction without residual deficits: Secondary | ICD-10-CM

## 2019-01-29 DIAGNOSIS — Z87891 Personal history of nicotine dependence: Secondary | ICD-10-CM

## 2019-01-29 DIAGNOSIS — I2510 Atherosclerotic heart disease of native coronary artery without angina pectoris: Secondary | ICD-10-CM | POA: Diagnosis not present

## 2019-01-29 DIAGNOSIS — E1165 Type 2 diabetes mellitus with hyperglycemia: Secondary | ICD-10-CM

## 2019-01-29 DIAGNOSIS — D631 Anemia in chronic kidney disease: Secondary | ICD-10-CM

## 2019-01-29 DIAGNOSIS — Z951 Presence of aortocoronary bypass graft: Secondary | ICD-10-CM

## 2019-01-29 DIAGNOSIS — E1122 Type 2 diabetes mellitus with diabetic chronic kidney disease: Secondary | ICD-10-CM | POA: Diagnosis not present

## 2019-01-29 DIAGNOSIS — I130 Hypertensive heart and chronic kidney disease with heart failure and stage 1 through stage 4 chronic kidney disease, or unspecified chronic kidney disease: Secondary | ICD-10-CM | POA: Diagnosis not present

## 2019-01-29 DIAGNOSIS — Z862 Personal history of diseases of the blood and blood-forming organs and certain disorders involving the immune mechanism: Secondary | ICD-10-CM

## 2019-01-29 DIAGNOSIS — K219 Gastro-esophageal reflux disease without esophagitis: Secondary | ICD-10-CM

## 2019-01-29 DIAGNOSIS — E785 Hyperlipidemia, unspecified: Secondary | ICD-10-CM

## 2019-01-29 DIAGNOSIS — Z89511 Acquired absence of right leg below knee: Secondary | ICD-10-CM

## 2019-01-29 DIAGNOSIS — Z79899 Other long term (current) drug therapy: Secondary | ICD-10-CM

## 2019-01-29 NOTE — Assessment & Plan Note (Addendum)
Presents w/ complaints of chronic nausea and vomiting. Mentions intermittent episodes of nausea with clear emesis. Improved with food. Previously diagnosed as diabetic gastroparesis. No family hx of gastric cancer. States he suspects medication side effect although has not seen association with specific meds. Denies any fevers, chills, dietary changes or sick contact. Also chart review reveals recent diagnosis of UTI created w/ cipro. Differential includes gastroparesis vs med side effect vs outlet obstruction. Physical exam benign.  - CMP, CBC - Advised to keep careful record of triggers to pinpoint cause of nausea and vomiting - CMP to assess for electrolyte abnormalities associated w/ chronic emesis

## 2019-01-29 NOTE — Progress Notes (Signed)
CC: Nausea, Vomiting  HPI: [name] is a 68 y.o. M w/ PMH of CAD s/p CABG, ischemic cardiomyopathy, systolic heart failure (EF 35-40% 08/2018), HTN, CVA,  PVD s/p bilateral BKAs, HLD, CKD4, GERD and anemia of chronic disease who presents to [facility] to establish care. He states that he has had significant cardiac history requiring bypass surgery and prior history of peripheral vascular disease requiring amputations. He also has chronic nausea and vomiting and state that anti-nausea medications he was prescribed in the past has not been helpful. He states he believes it may be because he is 'taking too many meds.' He states that he also would like for something to lose weight as he has pain of his stumps with walking long distances and he feels his prosthesis feels too tight. He denies any chest pain, diaphoresis, palpitations, dyspnea, nausea, vomiting, diarrhea, fevers, chills or sick contact.  Surgical History: 2008 CABG, Bilateral below knee amputations 2014  Past Surgical History:  Procedure Laterality Date   AMPUTATION Right 07/29/2012   Procedure: AMPUTATION 3RD TOE;  Surgeon: Kerin Salen, MD;  Location: [place];  Service: Orthopedics;  Laterality: Right;  right third toe amputation   AMPUTATION Right 08/02/2012   Procedure: right transmetatarsal amputation;  Surgeon: Kerin Salen, MD;  Location: [place];  Service: Orthopedics;  Laterality: Right;   AMPUTATION Right 09/06/2012   Procedure: AMPUTATION BELOW KNEE;  Surgeon: Kerin Salen, MD;  Location: [place];  Service: Orthopedics;  Laterality: Right;   AMPUTATION Left 09/27/2012   Procedure: AMPUTATION BELOW KNEE;  Surgeon: Kerin Salen, MD;  Location: [place];  Service: Orthopedics;  Laterality: Left;   CATARACT EXTRACTION W/ INTRAOCULAR LENS  IMPLANT, BILATERAL  ?2010   CORONARY ARTERY BYPASS GRAFT  2008   CABG X4   ESOPHAGOGASTRODUODENOSCOPY N/A 10/08/2012   Procedure: ESOPHAGOGASTRODUODENOSCOPY (EGD);  Surgeon: Winfield Cunas., MD;  Location: [facility] ENDOSCOPY;  Service: Endoscopy;  Laterality: N/A;   PERIPHERALLY INSERTED CENTRAL CATHETER INSERTION Right 09/2012   upper arm   Family History: Denies any significant family history of early cardiac disease or cancer No family history on file.  Social History: Currently lives alone but has friends nearby who can help him out. 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 Former smoker but quit more than a decade ago. Denies any alcohol or illicit substance use. Social History   Tobacco Use   Smoking status: Former Smoker    Packs/day: 0.12    Years: 20.00    Pack years: 2.40    Types: Cigarettes   Smokeless tobacco: Never Used  Substance Use Topics   Alcohol use: No    Comment: last drink 2010   Drug use: No   Past Medical History:  Diagnosis Date   Exertional shortness of breath    "before my last OR; I'm fine now" (09/26/2012)   GERD (gastroesophageal reflux disease)    High cholesterol    Hx of seasonal allergies    Hypertension    Myocardial infarction [facility])    Stroke ([place]) 2009   memory loss   Type II diabetes mellitus ([place])    Review of Systems: Review of Systems  Constitutional: Negative for chills, fever and malaise/fatigue.  Eyes: Negative for blurred vision.  Respiratory: Negative for cough and shortness of breath.   Cardiovascular: Positive for leg swelling. Negative for chest pain, palpitations and orthopnea.  Gastrointestinal: Positive for nausea and vomiting. Negative for constipation and diarrhea.  Neurological: Negative for dizziness.  All other systems reviewed and are negative.    Physical Exam: Vitals:   01/29/19 0933 01/29/19 1003  BP: (!) 161/85 138/80  Pulse: 92   Temp: 98.1 F (36.7 C)   TempSrc: Oral   SpO2: 100%   Weight: 209 lb 11.2 oz (95.1 kg)   Height: 5\' 10"  (1.778 m)     Physical Exam  Constitutional: He is oriented to person, place, and time. He appears well-developed  and well-nourished. No distress.  HENT:  Mouth/Throat: Oropharynx is clear and moist.  Eyes: Conjunctivae are normal.  Neck: Normal range of motion. Neck supple. No JVD present.  Cardiovascular: Normal rate, regular rhythm and intact distal pulses.  Murmur (2/6 systolic murmur loudest at the apex) heard. Respiratory: Effort normal and breath sounds normal. He has no wheezes. He has no rales.  GI: Soft. Bowel sounds are normal. He exhibits no distension. There is no abdominal tenderness.  Musculoskeletal: Normal range of motion.        General: Deformity (bilateral below knee amputations) present. No edema (No edema of bilateral lower extremities).  Neurological: He is alert and oriented to person, place, and time.  Skin: Skin is warm and dry.    Assessment & Plan:   Diabetes mellitus type 2, uncontrolled, with complications [facility]) Lab Results  Component Value Date   HGBA1C 5.4 12/18/2018   Presents w/ history of uncontrolled diabetes. [name] mentions prior hx but cannot recall details. On chart review, previous hgb a1c at 10.9 in 2014 but all a1c afterwards have been below 6, including hgb a1c of 5.4 on 12/2018. Unclear if due to lab error or self-resolved. Currently not on any diabetic treatment. Denies any polyuria, polydipsia, polyphagia.  - Monitor for now  Nausea with vomiting Presents w/ complaints of chronic nausea and vomiting. Mentions intermittent episodes of nausea with clear emesis. Improved with food. Previously diagnosed as diabetic gastroparesis. No family hx of gastric cancer. States he suspects medication side effect although has not seen association with specific meds. Denies any fevers, chills, dietary changes or sick contact. Also chart review reveals recent diagnosis of UTI created w/ cipro. Differential includes gastroparesis vs med side effect vs outlet obstruction. Physical exam benign.  - CMP, CBC - Advised to keep careful record of triggers to pinpoint cause of  nausea and vomiting - CMP to assess for electrolyte abnormalities associated w/ chronic emesis  Chronic systolic heart failure (HCC) Presents w/ hx of systolic heart failure due to ischemic cardiomyopathy. Follows with Dr.Harwani. Echo on 08/2018 showing EF of 35-40%. Denies any current chest pain, palpitations, dyspnea, orthopnea. Mentions some leg swelling but no dependent edema on exam. On asa, atorvastatin, metoprolol and imdur at home.  - Appear euvolemic on exam - Likely would benefit from addition of ACE or ARB but [name] is resistant to start new medication - F/u with cardiology  Hypertension BP Readings from Last 3 Encounters:  01/29/19 138/80  12/20/18 (!) 145/83  08/23/18 (!) 146/82   Bp above goal. Mentions not taking his bp meds this morning as 'he does not take meds on empty stomach.' Also meeting new provider. If continues to be elevated, will need additional anti-hypertensive therapy.  - C/w amlodipine 2.5mg  daily - CMP to assess renal fx - Consider starting ACE/ARB at next visit if hypertensive  CKD (chronic kidney disease), stage IV (HCC) Presents w/ hx of CKD stage 4. 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 Seen by nephro during  hospitalizations but did not f/u in outpatient setting.  - Need ace/arb for nephroprotection but resistant to start new meds - Check CMP today to trend renal fx - Will need referral to nephro if patient is willing  Normocytic anemia Presents w/ hx of anemia of chronic disease. Has CKD stage 4. Previously also had iron deficieny anemia treated w/ iron supplementation but last Ferritin checked 6 years prior. Will recheck today.  - Cbc, iron panel  Healthcare maintenance Agrees to receive pneumovar in clinic today    Patient discussed with Dr. Rebeca Alert   -Gilberto Better, [facility] Pager: [phone]

## 2019-01-29 NOTE — Patient Instructions (Addendum)
Dear Loreli Dollar,  Thank you for allowing [place] to provide your care today. Today we discussed your nausea and vomiting    I have ordered cmp, cbc, hep c and iron labs for you. I will call if any are abnormal.    Today we made the following changes to your medications:    Please hold off taking your iron supplements for now  Please follow-up in 3 month.    Should you have any questions or concerns please call the internal medicine clinic at [phone].    Thank you for choosing [facility].   DASH Eating Plan DASH stands for "Dietary Approaches to Stop Hypertension." The DASH eating plan is a healthy eating plan that has been shown to reduce high blood pressure (hypertension). It may also reduce your risk for type 2 diabetes, heart disease, and stroke. The DASH eating plan may also help with weight loss. What are tips for following this plan?  General guidelines  Avoid eating more than 2,300 mg (milligrams) of salt (sodium) a day. If you have hypertension, you may need to reduce your sodium intake to 1,500 mg a day.  Limit alcohol intake to no more than 1 drink a day for nonpregnant women and 2 drinks a day for men. One drink equals 12 oz of beer, 5 oz of wine, or 1 oz of hard liquor.  Work with your health care provider to maintain a healthy body weight or to lose weight. Ask what an ideal weight is for you.  Get at least 30 minutes of exercise that causes your heart to beat faster (aerobic exercise) most days of the week. Activities may include walking, swimming, or biking.  Work with your health care provider or diet and nutrition specialist (dietitian) to adjust your eating plan to your individual calorie needs. Reading food labels   Check food labels for the amount of sodium per serving. Choose foods with less than 5 percent of the Daily Value of sodium. Generally, foods with less than 300 mg of sodium per serving fit into this eating plan.  To find whole grains, look  for the word "whole" as the first word in the ingredient list. Shopping  Buy products labeled as "low-sodium" or "no salt added."  Buy fresh foods. Avoid canned foods and premade or frozen meals. Cooking  Avoid adding salt when cooking. Use salt-free seasonings or herbs instead of table salt or sea salt. Check with your health care provider or pharmacist before using salt substitutes.  Do not fry foods. Cook foods using healthy methods such as baking, boiling, grilling, and broiling instead.  Cook with heart-healthy oils, such as olive, canola, soybean, or sunflower oil. Meal planning  Eat a balanced diet that includes: ? 5 or more servings of fruits and vegetables each day. At each meal, try to fill half of your plate with fruits and vegetables. ? Up to 6-8 servings of whole grains each day. ? Less than 6 oz of lean meat, poultry, or fish each day. A 3-oz serving of meat is about the same size as a deck of cards. One egg equals 1 oz. ? 2 servings of low-fat dairy each day. ? A serving of nuts, seeds, or beans 5 times each week. ? Heart-healthy fats. Healthy fats called Omega-3 fatty acids are found in foods such as flaxseeds and coldwater fish, like sardines, salmon, and mackerel.  Limit how much you eat of the following: ? Canned or prepackaged foods. ? Food that is  high in trans fat, such as fried foods. ? Food that is high in saturated fat, such as fatty meat. ? Sweets, desserts, sugary drinks, and other foods with added sugar. ? Full-fat dairy products.  Do not salt foods before eating.  Try to eat at least 2 vegetarian meals each week.  Eat more home-cooked food and less restaurant, buffet, and fast food.  When eating at a restaurant, ask that your food be prepared with less salt or no salt, if possible. What foods are recommended? The items listed may not be a complete list. Talk with your dietitian about what dietary choices are best for you. Grains Whole-grain or  whole-wheat bread. Whole-grain or whole-wheat pasta. Brown rice. [place]. Bulgur. Whole-grain and low-sodium cereals. Pita bread. Low-fat, low-sodium crackers. Whole-wheat flour tortillas. Vegetables Fresh or frozen vegetables (raw, steamed, roasted, or grilled). Low-sodium or reduced-sodium tomato and vegetable juice. Low-sodium or reduced-sodium tomato sauce and tomato paste. Low-sodium or reduced-sodium canned vegetables. Fruits All fresh, dried, or frozen fruit. Canned fruit in natural juice (without added sugar). Meat and other protein foods Skinless chicken or [place]. Ground chicken or [place]. Pork with fat trimmed off. Fish and seafood. Egg whites. Dried beans, peas, or lentils. Unsalted nuts, nut butters, and seeds. Unsalted canned beans. Lean cuts of beef with fat trimmed off. Low-sodium, lean deli meat. Dairy Low-fat (1%) or fat-free (skim) milk. Fat-free, low-fat, or reduced-fat cheeses. Nonfat, low-sodium ricotta or cottage cheese. Low-fat or nonfat yogurt. Low-fat, low-sodium cheese. Fats and oils Soft margarine without trans fats. Vegetable oil. Low-fat, reduced-fat, or light mayonnaise and salad dressings (reduced-sodium). Canola, safflower, olive, soybean, and sunflower oils. Avocado. Seasoning and other foods Herbs. Spices. Seasoning mixes without salt. Unsalted popcorn and pretzels. Fat-free sweets. What foods are not recommended? The items listed may not be a complete list. Talk with your dietitian about what dietary choices are best for you. Grains Baked goods made with fat, such as croissants, muffins, or some breads. Dry pasta or rice meal packs. Vegetables Creamed or fried vegetables. Vegetables in a cheese sauce. Regular canned vegetables (not low-sodium or reduced-sodium). Regular canned tomato sauce and paste (not low-sodium or reduced-sodium). Regular tomato and vegetable juice (not low-sodium or reduced-sodium). Angie Fava. Olives. Fruits Canned fruit in a light  or heavy syrup. Fried fruit. Fruit in cream or butter sauce. Meat and other protein foods Fatty cuts of meat. Ribs. Fried meat. Berniece Salines. Sausage. Bologna and other processed lunch meats. Salami. Fatback. Hotdogs. Bratwurst. Salted nuts and seeds. Canned beans with added salt. Canned or smoked fish. Whole eggs or egg yolks. Chicken or [place] with skin. Dairy Whole or 2% milk, cream, and half-and-half. Whole or full-fat cream cheese. Whole-fat or sweetened yogurt. Full-fat cheese. Nondairy creamers. Whipped toppings. Processed cheese and cheese spreads. Fats and oils Butter. Stick margarine. Lard. Shortening. Ghee. Bacon fat. Tropical oils, such as coconut, palm kernel, or palm oil. Seasoning and other foods Salted popcorn and pretzels. Onion salt, garlic salt, seasoned salt, table salt, and sea salt. Worcestershire sauce. Tartar sauce. Barbecue sauce. Teriyaki sauce. Soy sauce, including reduced-sodium. Steak sauce. Canned and packaged gravies. Fish sauce. Oyster sauce. Cocktail sauce. Horseradish that you find on the shelf. Ketchup. Mustard. Meat flavorings and tenderizers. Bouillon cubes. Hot sauce and Tabasco sauce. Premade or packaged marinades. Premade or packaged taco seasonings. Relishes. Regular salad dressings. Where to find more information:  National Heart, Lung, and [place]: https://wilson-eaton.com/  American Heart Association: www.heart.org Summary  The DASH eating plan is a healthy eating  plan that has been shown to reduce high blood pressure (hypertension). It may also reduce your risk for type 2 diabetes, heart disease, and stroke.  With the DASH eating plan, you should limit salt (sodium) intake to 2,300 mg a day. If you have hypertension, you may need to reduce your sodium intake to 1,500 mg a day.  When on the DASH eating plan, aim to eat more fresh fruits and vegetables, whole grains, lean proteins, low-fat dairy, and heart-healthy fats.  Work with your health care provider or  diet and nutrition specialist (dietitian) to adjust your eating plan to your individual calorie needs. This information is not intended to replace advice given to you by your health care provider. Make sure you discuss any questions you have with your health care provider. Document Released: 02/11/2011 Document Revised: 02/04/2017 Document Reviewed: 02/16/2016 Elsevier Patient Education  2020 [employer].

## 2019-01-29 NOTE — Assessment & Plan Note (Addendum)
BP Readings from Last 3 Encounters:  01/29/19 138/80  12/20/18 (!) 145/83  08/23/18 (!) 146/82   Bp above goal. Mentions not taking his bp meds this morning as 'he does not take meds on empty stomach.' Also meeting new provider. If continues to be elevated, will need additional anti-hypertensive therapy.  - C/w amlodipine 2.5mg  daily - CMP to assess renal fx - Consider starting ACE/ARB at next visit if hypertensive

## 2019-01-29 NOTE — Assessment & Plan Note (Signed)
Presents w/ hx of CKD stage 4. 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 Seen by nephro during hospitalizations but did not f/u in outpatient setting.  - Need ace/arb for nephroprotection but resistant to start new meds - Check CMP today to trend renal fx - Will need referral to nephro if patient is willing

## 2019-01-29 NOTE — Assessment & Plan Note (Signed)
Presents w/ hx of systolic heart failure due to ischemic cardiomyopathy. Follows with Dr.Harwani. Echo on 08/2018 showing EF of 35-40%. Denies any current chest pain, palpitations, dyspnea, orthopnea. Mentions some leg swelling but no dependent edema on exam. On asa, atorvastatin, metoprolol and imdur at home.  - Appear euvolemic on exam - Likely would benefit from addition of ACE or ARB but [name] is resistant to start new medication - F/u with cardiology

## 2019-01-29 NOTE — Assessment & Plan Note (Signed)
Agrees to receive pneumovar in clinic today

## 2019-01-29 NOTE — Assessment & Plan Note (Signed)
Lab Results  Component Value Date   HGBA1C 5.4 12/18/2018   Presents w/ history of uncontrolled diabetes. [name] mentions prior hx but cannot recall details. On chart review, previous hgb a1c at 10.9 in 2014 but all a1c afterwards have been below 6, including hgb a1c of 5.4 on 12/2018. Unclear if due to lab error or self-resolved. Currently not on any diabetic treatment. Denies any polyuria, polydipsia, polyphagia.  - Monitor for now

## 2019-01-29 NOTE — Assessment & Plan Note (Signed)
Presents w/ hx of anemia of chronic disease. Has CKD stage 4. Previously also had iron deficieny anemia treated w/ iron supplementation but last Ferritin checked 6 years prior. Will recheck today.  - Cbc, iron panel

## 2019-01-30 ENCOUNTER — Other Ambulatory Visit: Payer: Self-pay

## 2019-01-30 ENCOUNTER — Emergency Department (HOSPITAL_COMMUNITY): Payer: Medicare HMO

## 2019-01-30 ENCOUNTER — Inpatient Hospital Stay (HOSPITAL_COMMUNITY)
Admission: EM | Admit: 2019-01-30 | Discharge: 2019-02-02 | DRG: 291 | Disposition: A | Discharge status: Home / Self Care | Payer: Medicare HMO | Attending: Internal Medicine | Admitting: Internal Medicine

## 2019-01-30 ENCOUNTER — Encounter (HOSPITAL_COMMUNITY): Payer: Self-pay | Admitting: Emergency Medicine

## 2019-01-30 DIAGNOSIS — Z20828 Contact with and (suspected) exposure to other viral communicable diseases: Secondary | ICD-10-CM | POA: Diagnosis present

## 2019-01-30 DIAGNOSIS — Z951 Presence of aortocoronary bypass graft: Secondary | ICD-10-CM

## 2019-01-30 DIAGNOSIS — J90 Pleural effusion, not elsewhere classified: Secondary | ICD-10-CM

## 2019-01-30 DIAGNOSIS — N5089 Other specified disorders of the male genital organs: Secondary | ICD-10-CM

## 2019-01-30 DIAGNOSIS — Z7982 Long term (current) use of aspirin: Secondary | ICD-10-CM

## 2019-01-30 DIAGNOSIS — I5023 Acute on chronic systolic (congestive) heart failure: Secondary | ICD-10-CM | POA: Diagnosis not present

## 2019-01-30 DIAGNOSIS — D631 Anemia in chronic kidney disease: Secondary | ICD-10-CM | POA: Diagnosis not present

## 2019-01-30 DIAGNOSIS — D638 Anemia in other chronic diseases classified elsewhere: Secondary | ICD-10-CM | POA: Diagnosis present

## 2019-01-30 DIAGNOSIS — Z833 Family history of diabetes mellitus: Secondary | ICD-10-CM | POA: Diagnosis not present

## 2019-01-30 DIAGNOSIS — E785 Hyperlipidemia, unspecified: Secondary | ICD-10-CM | POA: Diagnosis not present

## 2019-01-30 DIAGNOSIS — R011 Cardiac murmur, unspecified: Secondary | ICD-10-CM | POA: Diagnosis not present

## 2019-01-30 DIAGNOSIS — E1151 Type 2 diabetes mellitus with diabetic peripheral angiopathy without gangrene: Secondary | ICD-10-CM | POA: Diagnosis not present

## 2019-01-30 DIAGNOSIS — I130 Hypertensive heart and chronic kidney disease with heart failure and stage 1 through stage 4 chronic kidney disease, or unspecified chronic kidney disease: Secondary | ICD-10-CM | POA: Diagnosis not present

## 2019-01-30 DIAGNOSIS — N184 Chronic kidney disease, stage 4 (severe): Secondary | ICD-10-CM | POA: Diagnosis present

## 2019-01-30 DIAGNOSIS — K219 Gastro-esophageal reflux disease without esophagitis: Secondary | ICD-10-CM | POA: Diagnosis present

## 2019-01-30 DIAGNOSIS — Z89512 Acquired absence of left leg below knee: Secondary | ICD-10-CM

## 2019-01-30 DIAGNOSIS — I2510 Atherosclerotic heart disease of native coronary artery without angina pectoris: Secondary | ICD-10-CM | POA: Diagnosis not present

## 2019-01-30 DIAGNOSIS — I255 Ischemic cardiomyopathy: Secondary | ICD-10-CM | POA: Diagnosis not present

## 2019-01-30 DIAGNOSIS — Z89511 Acquired absence of right leg below knee: Secondary | ICD-10-CM

## 2019-01-30 DIAGNOSIS — Z8249 Family history of ischemic heart disease and other diseases of the circulatory system: Secondary | ICD-10-CM | POA: Diagnosis not present

## 2019-01-30 DIAGNOSIS — E7800 Pure hypercholesterolemia, unspecified: Secondary | ICD-10-CM | POA: Diagnosis present

## 2019-01-30 DIAGNOSIS — J9811 Atelectasis: Secondary | ICD-10-CM | POA: Diagnosis not present

## 2019-01-30 DIAGNOSIS — I252 Old myocardial infarction: Secondary | ICD-10-CM

## 2019-01-30 DIAGNOSIS — I4510 Unspecified right bundle-branch block: Secondary | ICD-10-CM | POA: Diagnosis not present

## 2019-01-30 DIAGNOSIS — E1122 Type 2 diabetes mellitus with diabetic chronic kidney disease: Secondary | ICD-10-CM | POA: Diagnosis not present

## 2019-01-30 DIAGNOSIS — Z79899 Other long term (current) drug therapy: Secondary | ICD-10-CM | POA: Diagnosis not present

## 2019-01-30 DIAGNOSIS — I10 Essential (primary) hypertension: Secondary | ICD-10-CM | POA: Diagnosis present

## 2019-01-30 DIAGNOSIS — D649 Anemia, unspecified: Secondary | ICD-10-CM | POA: Diagnosis present

## 2019-01-30 DIAGNOSIS — Z8673 Personal history of transient ischemic attack (TIA), and cerebral infarction without residual deficits: Secondary | ICD-10-CM | POA: Diagnosis not present

## 2019-01-30 HISTORY — DX: Other specified disorders of the male genital organs: N50.89

## 2019-01-30 LAB — BASIC METABOLIC PANEL
Anion gap: 11 (ref 5–15)
BUN: 25 mg/dL — ABNORMAL HIGH (ref 8–23)
CO2: 21 mmol/L — ABNORMAL LOW (ref 22–32)
Calcium: 8.9 mg/dL (ref 8.9–10.3)
Chloride: 106 mmol/L (ref 98–111)
Creatinine, Ser: 3.02 mg/dL — ABNORMAL HIGH (ref 0.61–1.24)
GFR calc Af Amer: 23 mL/min — ABNORMAL LOW (ref >60)
GFR calc non Af Amer: 20 mL/min — ABNORMAL LOW (ref >60)
Glucose, Bld: 130 mg/dL — ABNORMAL HIGH (ref 70–99)
Potassium: 4.1 mmol/L (ref 3.5–5.1)
Sodium: 138 mmol/L (ref 135–145)

## 2019-01-30 LAB — CBC WITH DIFFERENTIAL/PLATELET
Basophils Absolute: 0 10*3/uL (ref 0.0–0.2)
Basos: 1 %
EOS (ABSOLUTE): 0.1 10*3/uL (ref 0.0–0.4)
Eos: 3 %
Hematocrit: 26.8 % — ABNORMAL LOW (ref 37.5–51.0)
Hemoglobin: 9 g/dL — ABNORMAL LOW (ref 13.0–17.7)
Immature Grans (Abs): 0 10*3/uL (ref 0.0–0.1)
Immature Granulocytes: 0 %
Lymphocytes Absolute: 1.3 10*3/uL (ref 0.7–3.1)
Lymphs: 35 %
MCH: 28.7 pg (ref 26.6–33.0)
MCHC: 33.6 g/dL (ref 31.5–35.7)
MCV: 85 fL (ref 79–97)
Monocytes Absolute: 0.6 10*3/uL (ref 0.1–0.9)
Monocytes: 15 %
Neutrophils Absolute: 1.7 10*3/uL (ref 1.4–7.0)
Neutrophils: 46 %
Platelets: 182 10*3/uL (ref 150–450)
RBC: 3.14 x10E6/uL — ABNORMAL LOW (ref 4.14–5.80)
RDW: 18.5 % — ABNORMAL HIGH (ref 11.6–15.4)
WBC: 3.8 10*3/uL (ref 3.4–10.8)

## 2019-01-30 LAB — CMP14 + ANION GAP
ALT: 18 IU/L (ref 0–44)
AST: 18 IU/L (ref 0–40)
Albumin/Globulin Ratio: 1.4 (ref 1.2–2.2)
Albumin: 3.6 g/dL — ABNORMAL LOW (ref 3.8–4.8)
Alkaline Phosphatase: 122 IU/L — ABNORMAL HIGH (ref 39–117)
Anion Gap: 16 mmol/L (ref 10.0–18.0)
BUN/Creatinine Ratio: 9 — ABNORMAL LOW (ref 10–24)
BUN: 25 mg/dL (ref 8–27)
Bilirubin Total: 1.4 mg/dL — ABNORMAL HIGH (ref 0.0–1.2)
CO2: 20 mmol/L (ref 20–29)
Calcium: 8.9 mg/dL (ref 8.6–10.2)
Chloride: 102 mmol/L (ref 96–106)
Creatinine, Ser: 2.94 mg/dL — ABNORMAL HIGH (ref 0.76–1.27)
GFR calc Af Amer: 24 mL/min/{1.73_m2} — ABNORMAL LOW (ref >59)
GFR calc non Af Amer: 21 mL/min/{1.73_m2} — ABNORMAL LOW (ref >59)
Globulin, Total: 2.5 g/dL (ref 1.5–4.5)
Glucose: 129 mg/dL — ABNORMAL HIGH (ref 65–99)
Potassium: 4.6 mmol/L (ref 3.5–5.2)
Sodium: 138 mmol/L (ref 134–144)
Total Protein: 6.1 g/dL (ref 6.0–8.5)

## 2019-01-30 LAB — URINALYSIS, ROUTINE W REFLEX MICROSCOPIC
Bacteria, UA: NONE SEEN
Bilirubin Urine: NEGATIVE
Glucose, UA: NEGATIVE mg/dL
Hgb urine dipstick: NEGATIVE
Ketones, ur: NEGATIVE mg/dL
Leukocytes,Ua: NEGATIVE
Nitrite: NEGATIVE
Protein, ur: 30 mg/dL — AB
Specific Gravity, Urine: 1.004 — ABNORMAL LOW (ref 1.005–1.030)
pH: 7 (ref 5.0–8.0)

## 2019-01-30 LAB — IRON,TIBC AND FERRITIN PANEL
Ferritin: 1275 ng/mL — ABNORMAL HIGH (ref 30–400)
Iron Saturation: 20 % (ref 15–55)
Iron: 46 ug/dL (ref 38–169)
Total Iron Binding Capacity: 228 ug/dL — ABNORMAL LOW (ref 250–450)
UIBC: 182 ug/dL (ref 111–343)

## 2019-01-30 LAB — GLUCOSE, CAPILLARY
Glucose-Capillary: 112 mg/dL — ABNORMAL HIGH (ref 70–99)
Glucose-Capillary: 139 mg/dL — ABNORMAL HIGH (ref 70–99)
Glucose-Capillary: 132 mg/dL — ABNORMAL HIGH (ref 70–99)

## 2019-01-30 LAB — CBC
HCT: 29.3 % — ABNORMAL LOW (ref 39.0–52.0)
Hemoglobin: 9.3 g/dL — ABNORMAL LOW (ref 13.0–17.0)
MCH: 28.9 pg (ref 26.0–34.0)
MCHC: 31.7 g/dL (ref 30.0–36.0)
MCV: 91 fL (ref 80.0–100.0)
Platelets: 134 10*3/uL — ABNORMAL LOW (ref 150–400)
RBC: 3.22 MIL/uL — ABNORMAL LOW (ref 4.22–5.81)
RDW: 19.3 % — ABNORMAL HIGH (ref 11.5–15.5)
WBC: 4.1 10*3/uL (ref 4.0–10.5)
nRBC: 0.5 % — ABNORMAL HIGH (ref 0.0–0.2)

## 2019-01-30 LAB — SARS CORONAVIRUS 2 (TAT 6-24 HRS): SARS Coronavirus 2: NEGATIVE

## 2019-01-30 LAB — HEPATITIS C ANTIBODY: Hep C Virus Ab: <0.1 s/co ratio (ref 0.0–0.9)

## 2019-01-30 LAB — BRAIN NATRIURETIC PEPTIDE: B Natriuretic Peptide: >4500 pg/mL — ABNORMAL HIGH (ref 0.0–100.0)

## 2019-01-30 MED ORDER — TAMSULOSIN HCL 0.4 MG PO CAPS
0.4000 mg | ORAL_CAPSULE | Freq: Every morning | ORAL | Status: DC
  Administered 2019-01-30 – 2019-02-02 (×4): 0.4 mg via ORAL
  Filled 2019-01-30 (×4): qty 1

## 2019-01-30 MED ORDER — ENOXAPARIN SODIUM 30 MG/0.3ML ~~LOC~~ SOLN
30.0000 mg | SUBCUTANEOUS | Status: DC
  Administered 2019-01-30 – 2019-02-01 (×3): 30 mg via SUBCUTANEOUS
  Filled 2019-01-30 (×3): qty 0.3

## 2019-01-30 MED ORDER — FUROSEMIDE 10 MG/ML IJ SOLN
40.0000 mg | Freq: Two times a day (BID) | INTRAMUSCULAR | Status: DC
  Administered 2019-01-30 – 2019-02-02 (×6): 40 mg via INTRAVENOUS
  Filled 2019-01-30 (×6): qty 4

## 2019-01-30 MED ORDER — ONDANSETRON HCL 4 MG/2ML IJ SOLN: 4.0000 mg | Freq: Four times a day (QID) | INTRAMUSCULAR | Status: DC | PRN

## 2019-01-30 MED ORDER — FUROSEMIDE 10 MG/ML IJ SOLN
40.0000 mg | Freq: Once | INTRAMUSCULAR | Status: AC
  Administered 2019-01-30: 12:00:00 40 mg via INTRAVENOUS
  Filled 2019-01-30: qty 4

## 2019-01-30 MED ORDER — ONDANSETRON HCL 4 MG PO TABS: 4.0000 mg | ORAL_TABLET | Freq: Four times a day (QID) | ORAL | Status: DC | PRN

## 2019-01-30 MED ORDER — ACETAMINOPHEN 325 MG PO TABS: 650.0000 mg | ORAL_TABLET | Freq: Four times a day (QID) | ORAL | Status: DC | PRN

## 2019-01-30 MED ORDER — AMLODIPINE BESYLATE 5 MG PO TABS: 2.5000 mg | ORAL_TABLET | Freq: Every day | ORAL | Status: DC

## 2019-01-30 MED ORDER — ISOSORBIDE MONONITRATE ER 30 MG PO TB24: 30.0000 mg | ORAL_TABLET | Freq: Every day | ORAL | Status: DC

## 2019-01-30 MED ORDER — ATORVASTATIN CALCIUM 40 MG PO TABS
40.0000 mg | ORAL_TABLET | Freq: Every morning | ORAL | Status: DC
  Administered 2019-01-30 – 2019-02-02 (×4): 40 mg via ORAL
  Filled 2019-01-30 (×4): qty 1

## 2019-01-30 MED ORDER — NITROGLYCERIN 0.4 MG SL SUBL: 0.4000 mg | SUBLINGUAL_TABLET | SUBLINGUAL | Status: DC | PRN

## 2019-01-30 MED ORDER — ACETAMINOPHEN 650 MG RE SUPP: 650.0000 mg | Freq: Four times a day (QID) | RECTAL | Status: DC | PRN

## 2019-01-30 MED ORDER — PANTOPRAZOLE SODIUM 40 MG PO TBEC
40.0000 mg | DELAYED_RELEASE_TABLET | Freq: Every day | ORAL | Status: DC
  Administered 2019-01-30 – 2019-02-02 (×4): 40 mg via ORAL
  Filled 2019-01-30 (×4): qty 1

## 2019-01-30 MED ORDER — METOPROLOL SUCCINATE ER 25 MG PO TB24
50.0000 mg | ORAL_TABLET | Freq: Every morning | ORAL | Status: DC
  Administered 2019-01-30 – 2019-02-02 (×4): 50 mg via ORAL
  Filled 2019-01-30 (×4): qty 2

## 2019-01-30 MED ORDER — ASPIRIN EC 81 MG PO TBEC
81.0000 mg | DELAYED_RELEASE_TABLET | Freq: Every morning | ORAL | Status: DC
  Administered 2019-01-30 – 2019-02-02 (×4): 81 mg via ORAL
  Filled 2019-01-30 (×4): qty 1

## 2019-01-30 MED ORDER — ISOSORB DINITRATE-HYDRALAZINE 20-37.5 MG PO TABS
1.0000 | ORAL_TABLET | Freq: Three times a day (TID) | ORAL | Status: DC
  Administered 2019-01-30 – 2019-02-02 (×9): 1 via ORAL
  Filled 2019-01-30 (×9): qty 1

## 2019-01-30 NOTE — ED Provider Notes (Signed)
[facility] EMERGENCY DEPARTMENT Provider Note   CSN: [id number] Arrival date & time: 01/30/19  [id number]     History   Chief Complaint No chief complaint on file.   HPI [name] is a 68 y.o. male with history of GERD, hyperlipidemia, hypertension, MI, CVA, type 2 diabetes mellitus, CHF with LVEF 35 to 40%, CKD presents for evaluation of acute onset, progressively worsening scrotal edema since yesterday.  Reports constant aching pain secondary to the swelling.  Denies dysuria, hematuria, urgency, frequency, or difficulty urinating.  States he has never had something like this happen before.  Denies abdominal pain, nausea, vomiting, chest pain, or shortness of breath.  He was seen and evaluated by his PCP yesterday but states that it was not very bad at the time so he did not mention it.  He has not tried anything for his symptoms.  Reports that he is not on any fluid pills at this time.     The history is provided by the patient.    Past Medical History:  Diagnosis Date   Exertional shortness of breath    "before my last OR; I'm fine now" (09/26/2012)   GERD (gastroesophageal reflux disease)    High cholesterol    Hx of seasonal allergies    Hypertension    Myocardial infarction [facility])    Stroke ([place]) 2009   memory loss   Type II diabetes mellitus ([place])     Patient Active Problem List   Diagnosis Date Noted   Acute on chronic systolic heart failure ([place]) 01/30/2019   Healthcare maintenance 01/29/2019   CKD (chronic kidney disease), stage IV ([place]) [id number]   Chronic systolic heart failure ([place]) 10/24/2012   Diabetic gastroparesis associated with type 2 diabetes mellitus ([place]) 10/24/2012   Major depressive disorder, recurrent episode, moderate ([place]) 10/05/2012   Nausea with vomiting 09/21/2012   Diabetes mellitus type 2, uncontrolled, with complications ([place]) [id number]   Gangrene of foot Left 07/27/2012   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    H/O: CVA (cerebrovascular accident) 07/27/2012   Hypertension 07/27/2012   Normocytic anemia 07/27/2012    Past Surgical History:  Procedure Laterality Date   AMPUTATION Right 07/29/2012   Procedure: AMPUTATION 3RD TOE;  Surgeon: Kerin Salen, MD;  Location: [facility];  Service: Orthopedics;  Laterality: Right;  right third toe amputation   AMPUTATION Right 08/02/2012   Procedure: right transmetatarsal amputation;  Surgeon: Kerin Salen, MD;  Location: [place];  Service: Orthopedics;  Laterality: Right;   AMPUTATION Right 09/06/2012   Procedure: AMPUTATION BELOW KNEE;  Surgeon: Kerin Salen, MD;  Location: [facility];  Service: Orthopedics;  Laterality: Right;   AMPUTATION Left 09/27/2012   Procedure: AMPUTATION BELOW KNEE;  Surgeon: Kerin Salen, MD;  Location: [facility];  Service: Orthopedics;  Laterality: Left;   CATARACT EXTRACTION W/ INTRAOCULAR LENS  IMPLANT, BILATERAL  ?2010   CORONARY ARTERY BYPASS GRAFT  2008   CABG X4   ESOPHAGOGASTRODUODENOSCOPY N/A 10/08/2012   Procedure: ESOPHAGOGASTRODUODENOSCOPY (EGD);  Surgeon: Winfield Cunas., MD;  Location[facility] ENDOSCOPY;  Service: Endoscopy;  Laterality: N/A;   PERIPHERALLY INSERTED CENTRAL CATHETER INSERTION Right 09/2012   upper arm        Home Medications    Prior to Admission medications   Medication Sig Start Date End Date Taking? Authorizing Provider  amLODipine (NORVASC) 2.5 MG tablet Take 1 tablet (2.5 mg total) by mouth daily. 08/24/18   Mariel Aloe, MD  aspirin EC 81 MG tablet Take 81 mg by mouth every morning.    [provider]  atorvastatin (LIPITOR) 40 MG tablet Take 1 tablet (40 mg total) by mouth every morning. 08/23/18   Mariel Aloe, MD  Cyanocobalamin (VITAMIN B-12 PO) Take 1 tablet by mouth every morning.    [provider]  isosorbide mononitrate (IMDUR) 30 MG 24 hr tablet Take 1 tablet (30 mg total) by mouth daily. 12/20/18 12/20/19  Charolette Forward, MD  metoprolol succinate (TOPROL-XL) 50  MG 24 hr tablet Take 50 mg by mouth every morning. 05/31/18   [provider]  Multiple Vitamin (MULTIVITAMIN WITH MINERALS) TABS tablet Take 1 tablet by mouth every morning.    [provider]  nitroGLYCERIN (NITROSTAT) 0.4 MG SL tablet Place 0.4 mg under the tongue every 5 (five) minutes as needed for chest pain.    [provider]  pantoprazole (PROTONIX) 40 MG tablet Take 1 tablet (40 mg total) by mouth daily at 6 (six) AM. 12/21/18   Charolette Forward, MD  tamsulosin (FLOMAX) 0.4 MG CAPS capsule Take 0.4 mg by mouth every morning. 06/11/18   [provider]    Family History No family history on file.  Social History Social History   Tobacco Use   Smoking status: Former Smoker    Packs/day: 0.12    Years: 20.00    Pack years: 2.40    Types: Cigarettes   Smokeless tobacco: Never Used  Substance Use Topics   Alcohol use: No    Comment: last drink 2010   Drug use: No     Allergies   Patient has no known allergies.   Review of Systems Review of Systems  Constitutional: Negative for chills and fever.  Respiratory: Negative for shortness of breath.   Cardiovascular: Negative for chest pain.  Gastrointestinal: Negative for abdominal pain, nausea and vomiting.  Genitourinary: Positive for scrotal swelling and testicular pain. Negative for difficulty urinating, dysuria, frequency, hematuria and urgency.  All other systems reviewed and are negative.    Physical Exam Updated Vital Signs BP (!) 154/102    Pulse 93    Temp 98.4 F (36.9 C) (Oral)    Resp 18    SpO2 94%   Physical Exam Vitals signs and nursing note reviewed.  Constitutional:      General: He is not in acute distress.    Appearance: He is well-developed.  HENT:     Head: Normocephalic and atraumatic.  Eyes:     General:        Right eye: No discharge.        Left eye: No discharge.     Conjunctiva/sclera: Conjunctivae normal.  Neck:     Vascular: No JVD.      Trachea: No tracheal deviation.  Cardiovascular:     Rate and Rhythm: Normal rate and regular rhythm.  Pulmonary:     Effort: Pulmonary effort is normal.     Breath sounds: Rales present.     Comments: Bibasilar crackles noted.  Speaking in full sentences without difficulty Abdominal:     General: Bowel sounds are normal. There is no distension.     Palpations: Abdomen is soft.     Tenderness: There is no abdominal tenderness. There is no guarding.  Genitourinary:    Comments: Examination performed in the presence of a chaperone.  No masses or lesions noted.  He has edema to the scrotum extending into the penis and it is difficult to visualize  the urethral meatus as a result.  He has some pitting edema around his medial thighs as well.  No erythema or perineal tenderness or swelling.  Discomfort on palpation of the testicles. Skin:    General: Skin is warm and dry.     Findings: No erythema.  Neurological:     Mental Status: He is alert.  Psychiatric:        Behavior: Behavior normal.      ED Treatments / Results  Labs (all labs ordered are listed, but only abnormal results are displayed) Labs Reviewed  BASIC METABOLIC PANEL - Abnormal; Notable for the following components:      Result Value   CO2 21 (*)    Glucose, Bld 130 (*)    BUN 25 (*)    Creatinine, Ser 3.02 (*)    GFR calc non Af Amer 20 (*)    GFR calc Af Amer 23 (*)    All other components within normal limits  CBC - Abnormal; Notable for the following components:   RBC 3.22 (*)    Hemoglobin 9.3 (*)    HCT 29.3 (*)    RDW 19.3 (*)    Platelets 134 (*)    nRBC 0.5 (*)    All other components within normal limits  URINALYSIS, ROUTINE W REFLEX MICROSCOPIC  BRAIN NATRIURETIC PEPTIDE    EKG EKG Interpretation  Date/Time:  Tuesday January 30 2019 08:49:20 EST Ventricular Rate:  94 PR Interval:  180 QRS Duration: 144 QT Interval:  420 QTC Calculation: 525 R Axis:   153 Text Interpretation: Undetermined  rhythm Right bundle branch block Possible Inferior infarct , age undetermined Possible Anteroseptal infarct , age undetermined Abnormal ECG Confirmed by Lennice Sites [id number]) on 01/30/2019 9:40:10 AM   Radiology Dg Chest Portable 1 View  Result Date: 01/30/2019 CLINICAL DATA:  Scrotal edema. EXAM: PORTABLE CHEST 1 VIEW COMPARISON:  December 18, 2018. FINDINGS: Stable cardiomegaly. Status post coronary bypass graft. Atherosclerosis of thoracic aorta is noted. No pneumothorax is noted. Left lung is clear. Mild right midlung subsegmental atelectasis is noted. Minimal right basilar subsegmental atelectasis is noted with small right pleural effusion. Bony thorax is unremarkable. IMPRESSION: Aortic atherosclerosis. Stable mild right midlung subsegmental atelectasis. Minimal right basilar subsegmental atelectasis is noted with small right pleural effusion. Electronically Signed   By: Marijo Conception M.D.   On: 01/30/2019 10:09   US Scrotum W/doppler  Result Date: 01/30/2019 CLINICAL DATA:  68 year old male with scrotal edema. EXAM: SCROTAL ULTRASOUND DOPPLER ULTRASOUND OF THE TESTICLES TECHNIQUE: Complete ultrasound examination of the testicles, epididymis, and other scrotal structures was performed. Color and spectral Doppler ultrasound were also utilized to evaluate blood flow to the testicles. COMPARISON:  Noncontrast CT Abdomen and Pelvis 10/11/2012. FINDINGS: Right testicle Measurements: 2.8 x 2.0 x 1.6 centimeters. No mass or microlithiasis visualized. Left testicle Measurements: 2.6 x 1.7 x 1.7 centimeters. No mass or microlithiasis visualized. A small area of tubular ectasia of the rete testis is suspected (images 38 and 39), normal variant. Right epididymis:  Normal in size and appearance. Left epididymis:  Normal in size and appearance. Hydrocele: Small volume simple appearing left side hydrocele (image 37). Varicocele:  None visualized. Pulsed Doppler interrogation of both testes demonstrates normal  low resistance arterial and venous waveforms bilaterally. Other findings: Moderate to severe generalized scrotal edema (image 2). No evidence of soft tissue gas. IMPRESSION: 1. Negative for testicular mass or torsion. 2. Moderate to severe generalized scrotal edema is nonspecific but suggestive of  cellulitis. No ultrasound evidence of soft tissue gas. 3. Small probably postinflammatory left hydrocele. Electronically Signed   By: Genevie Ann M.D.   On: 01/30/2019 10:42    Procedures Procedures (including critical care time)  Medications Ordered in ED Medications  furosemide (LASIX) injection 40 mg (has no administration in time range)  aspirin EC tablet 81 mg (has no administration in time range)  amLODipine (NORVASC) tablet 2.5 mg (has no administration in time range)  atorvastatin (LIPITOR) tablet 40 mg (has no administration in time range)  isosorbide mononitrate (IMDUR) 24 hr tablet 30 mg (has no administration in time range)  metoprolol succinate (TOPROL-XL) 24 hr tablet 50 mg (has no administration in time range)  nitroGLYCERIN (NITROSTAT) SL tablet 0.4 mg (has no administration in time range)  pantoprazole (PROTONIX) EC tablet 40 mg (has no administration in time range)  tamsulosin (FLOMAX) capsule 0.4 mg (has no administration in time range)  enoxaparin (LOVENOX) injection 30 mg (has no administration in time range)  acetaminophen (TYLENOL) tablet 650 mg (has no administration in time range)    Or  acetaminophen (TYLENOL) suppository 650 mg (has no administration in time range)  ondansetron (ZOFRAN) tablet 4 mg (has no administration in time range)    Or  ondansetron (ZOFRAN) injection 4 mg (has no administration in time range)     Initial Impression / Assessment and Plan / ED Course  I have reviewed the triage vital signs and the nursing notes.  Pertinent labs & imaging results that were available during my care of the patient were reviewed by me and considered in my medical  decision making (see chart for details).        Patient presenting for evaluation of acute onset, progressively worsening scrotal edema since yesterday.  He is afebrile, mildly hypertensive in the ED.  He is nontoxic in appearance.  No tenderness to palpation of the abdomen.  No signs of secondary skin infection, low concern for Fournier's gangrene or necrotizing fasciitis infection.  He is not complaining of any shortness of breath but has adventitious breath sounds in the lung bases.  Has a history of CHF with EF 35 to 40% however is not on any diuretics.  Chest x-ray today shows right pleural effusion and ultrasound of the scrotum shows severe generalized scrotal edema, no evidence of cellulitis.  Suspect he has dependent edema secondary to his heart failure.  Lab work reviewed by me shows no leukocytosis, stable anemia, renal insufficiency with elevated BUN and creatinine around patient's baseline.  Feel he would benefit from admission to the hospital for further evaluation and management likely initiation of diuretics.  Will give dose of IV Lasix in the ED.  Internal medicine teaching service to admit. Final Clinical Impressions(s) / ED Diagnoses   Final diagnoses:  Scrotal edema  Pleural effusion    ED Discharge Orders    None       Renita Papa, PA-C 01/30/19 1118    Lennice Sites, DO 01/30/19 1549

## 2019-01-30 NOTE — H&P (Addendum)
Date: 01/30/2019               Patient Name:  [name] MRN: [id number]  DOB: 1951-01-10 Age / Sex: 68 y.o., male   PCP: [name], MD         Medical Service: Internal Medicine Teaching Service         Attending Physician: Dr. Lucious Groves, DO    First Contact: Dr. Court Joy Pager: [phone]  Second Contact: Dr. Koleen Distance  Pager: [phone]       After Hours (After 5p/  First Contact Pager: [phone]  weekends / holidays): Second Contact Pager: [phone]   Chief Complaint: Scrotal swelling  History of Present Illness: [name] is a 68 year old male with CAD s/p CABG, ischemic cardiomyopathy, systolic heart failure (EF 35-40% 08/2018), HTN, CVA,  PVD s/p bilateral BKAs, HLD, CKD4, GERD and anemia of chronic disease who presented to the emergency department for evaluation of acute onset, progressive scrotal edema. History was obtained via the patient and through chart review.  Patient states that he was in his normal state of health until two days ago when he noticed worsening swelling in his bilateral thighs and scrotum. He states that is not painful but it seems to be getting worse. He denies dysuria, hematuria, urinary frequency, difficulty urinating, fevers/chills, nausea/vomiting, chest pain, shortness of breath, PND, orthopnea, abdominal fullness, early satiety, change in urine output. He does acknowledge that he an appointment yesterday with control medicine residency clinic to establish care. During that time he voiced concerns about chronic nausea/vomiting and using antiemetic medications. He otherwise did not voice concerns about his scrotal edema or thigh edema because he did not feel it was bad enough. He states that he has never been on diuretic therapy.  Meds:  No current facility-administered medications on file prior to encounter.    Current Outpatient Medications on File Prior to Encounter  Medication Sig Dispense Refill  . amLODipine (NORVASC) 2.5 MG tablet  Take 1 tablet (2.5 mg total) by mouth daily. 30 tablet 0  . aspirin EC 81 MG tablet Take 81 mg by mouth every morning.    Marland Kitchen atorvastatin (LIPITOR) 40 MG tablet Take 1 tablet (40 mg total) by mouth every morning. 30 tablet 0  . Cyanocobalamin (VITAMIN B-12 PO) Take 1 tablet by mouth every morning.    . isosorbide mononitrate (IMDUR) 30 MG 24 hr tablet Take 1 tablet (30 mg total) by mouth daily. 30 tablet 11  . metoprolol succinate (TOPROL-XL) 50 MG 24 hr tablet Take 50 mg by mouth every morning.    . nitroGLYCERIN (NITROSTAT) 0.4 MG SL tablet Place 0.4 mg under the tongue every 5 (five) minutes as needed for chest pain.    . pantoprazole (PROTONIX) 40 MG tablet Take 1 tablet (40 mg total) by mouth daily at 6 (six) AM. 30 tablet 3  . tamsulosin (FLOMAX) 0.4 MG CAPS capsule Take 0.4 mg by mouth every morning.     Allergies: Allergies as of 01/30/2019  . (No Known Allergies)   Past Medical History:  Diagnosis Date  . Exertional shortness of breath    "before my last OR; I'm fine now" (09/26/2012)  . GERD (gastroesophageal reflux disease)   . High cholesterol   . Hx of seasonal allergies   . Hypertension   . Myocardial infarction ([place])   . Stroke [facility]) 2009   memory loss  . Type II diabetes mellitus ([place])    Family History: Patient endorses  a family history of hypertension, diabetes, and CAD.  Social History: Patient previously worked at a [employer], [employer]. However closed on several years ago and since that time he has been unemployed. He does not smoke cigarettes, use alcohol, or use illicit substances. He is not married. He currently lives alone.  Review of Systems: A complete ROS was negative except as per HPI.   Physical Exam: Blood pressure (!) 154/102, pulse 93, temperature 98.4 F (36.9 C), temperature source Oral, resp. rate 18, SpO2 94 %.  General: Well nourished male in no acute distress HENT: Normocephalic, atraumatic, moist mucus membranes Pulm: Good air movement with no  wheezing or crackles  CV: RRR, systolic crescendo decrescendo murmur, + JVD Abdomen: Active bowel sounds, soft, non-distended, no tenderness to palpation  Extremities: Bilateral above the knee amputation's. Moderate pitting edema of the bilateral thighs. Significant scrotal edema. No tenderness with palpation or manipulation of the scrotum. No surrounding erythema. Skin: Warm and dry  Neuro: Alert and oriented x 3  EKG: personally reviewed my interpretation is sinus rhythm with a right bundle branch block and chronic T-wave immersion in the anterior lateral leads. No changes compared to baseline.  CXR: personally reviewed my interpretation is for rotation and inspiration. Good penetration. Sternal wires in place. Graft markers noted. Chronic vascular congestion with fluid in the right major fissure. Small right sided pleural effusion. X-ray is slightly improved compared to chest x-ray obtained on 10/12.  Scrotal US 1. Negative for testicular mass or torsion. 2. Moderate to severe generalized scrotal edema is nonspecific but suggestive of cellulitis. No ultrasound evidence of soft tissue gas. 3. Small probably postinflammatory left hydrocele.  TTE 08/19/2018  1. Stage 1: stage1: Multiple segmental abnormalities exist. See findings.  2. The left ventricle has moderately reduced systolic function, with an ejection fraction of 35-40%. The cavity size was normal. There is mild concentric left ventricular hypertrophy. Diastolic dysfunction, grade indeterminate. Left ventrical global  hypokinesis without regional wall motion abnormalities.  3. Left atrial size was mildly dilated.  4. The mitral valve is grossly normal. Mild thickening of the mitral valve leaflet. Mitral valve regurgitation is moderate by color flow Doppler.  5. The tricuspid valve is grossly normal.  6. The aortic valve is grossly normal. Mild thickening of the aortic valve.  7. Mild aortic annular calcification.  8. The aortic root  is normal in size and structure.  9. The interatrial septum was not well visualized.  Assessment & Plan by Problem: Active Problems:   Acute on chronic systolic heart failure (HCC)  Maan Rymer is a 68 year old male with CAD s/p CABG, ischemic cardiomyopathy, systolic heart failure (EF 35-40% 08/2018), HTN, CVA,  PVD s/p bilateral BKAs, HLD, CKD4, GERD and anemia of chronic disease who presented to the emergency department for evaluation of acute onset, progressive scrotal edema.   Scrotal Edema  Acute on Chronic HFrEF, ischemic cardiomyopathy  - Patient presented with signs and symptoms of volume overload (R > L sided symptoms). BNP markedly elevated to > 4500.  - Plan will be to admit for IV diureses  - Start IV Furosemide 40 mg QD  - Strict I&O's  - Daily weights  - Continue Metoprolol succinate 50 mg QD - Unable to be on an aldosterone antagonist or ACE/ARB/ARNI due to CKD stage IV - Discontinue Isosorbide mononitrate 30 mg QD and amlodipine. Start Bidil for added mortality benefit - No LBBB and QRS is not long enough to necessitate need for CRT therapy  - Ferritin >  1500 so no iron infusion  - Will hold off on repeat echocardiogram at this point.  CKD Stage IV - Creatine and GFR at baseline  - Continue to monitor in setting of active diuresis   PVD  - Continue atorvastatin and ASA  DM  - Last A1c 5.4  - SSI while admitted.  Anemia of Chronic Disease - Ferritin elevated to >1500  - Hgb at baseline  - Continue to monitor   Diet: Heart/carb mod VTE ppx: Lovenox (renal adjustment) CODE STATUS: Full code  Dispo: Admit patient to Observation with expected length of stay less than 2 midnights.  SignedIna Homes, MD 01/30/2019, 11:33 AM  Pager: [phone]

## 2019-01-30 NOTE — ED Triage Notes (Signed)
Pt here from home with c/o swelling in his testicles pt was here yesterday but left, pt was sent yesterday from clinic

## 2019-01-31 DIAGNOSIS — J90 Pleural effusion, not elsewhere classified: Secondary | ICD-10-CM | POA: Diagnosis present

## 2019-01-31 DIAGNOSIS — D638 Anemia in other chronic diseases classified elsewhere: Secondary | ICD-10-CM | POA: Diagnosis present

## 2019-01-31 DIAGNOSIS — E785 Hyperlipidemia, unspecified: Secondary | ICD-10-CM | POA: Diagnosis present

## 2019-01-31 DIAGNOSIS — I255 Ischemic cardiomyopathy: Secondary | ICD-10-CM | POA: Diagnosis present

## 2019-01-31 DIAGNOSIS — Z89512 Acquired absence of left leg below knee: Secondary | ICD-10-CM | POA: Diagnosis not present

## 2019-01-31 DIAGNOSIS — Z7982 Long term (current) use of aspirin: Secondary | ICD-10-CM | POA: Diagnosis not present

## 2019-01-31 DIAGNOSIS — Z8673 Personal history of transient ischemic attack (TIA), and cerebral infarction without residual deficits: Secondary | ICD-10-CM | POA: Diagnosis not present

## 2019-01-31 DIAGNOSIS — Z79899 Other long term (current) drug therapy: Secondary | ICD-10-CM | POA: Diagnosis not present

## 2019-01-31 DIAGNOSIS — I130 Hypertensive heart and chronic kidney disease with heart failure and stage 1 through stage 4 chronic kidney disease, or unspecified chronic kidney disease: Secondary | ICD-10-CM | POA: Diagnosis present

## 2019-01-31 DIAGNOSIS — Z951 Presence of aortocoronary bypass graft: Secondary | ICD-10-CM | POA: Diagnosis not present

## 2019-01-31 DIAGNOSIS — E1122 Type 2 diabetes mellitus with diabetic chronic kidney disease: Secondary | ICD-10-CM | POA: Diagnosis not present

## 2019-01-31 DIAGNOSIS — Z833 Family history of diabetes mellitus: Secondary | ICD-10-CM | POA: Diagnosis not present

## 2019-01-31 DIAGNOSIS — N184 Chronic kidney disease, stage 4 (severe): Secondary | ICD-10-CM | POA: Diagnosis present

## 2019-01-31 DIAGNOSIS — E7800 Pure hypercholesterolemia, unspecified: Secondary | ICD-10-CM | POA: Diagnosis present

## 2019-01-31 DIAGNOSIS — Z20828 Contact with and (suspected) exposure to other viral communicable diseases: Secondary | ICD-10-CM | POA: Diagnosis present

## 2019-01-31 DIAGNOSIS — I5023 Acute on chronic systolic (congestive) heart failure: Secondary | ICD-10-CM | POA: Diagnosis present

## 2019-01-31 DIAGNOSIS — Z89511 Acquired absence of right leg below knee: Secondary | ICD-10-CM | POA: Diagnosis not present

## 2019-01-31 DIAGNOSIS — Z8249 Family history of ischemic heart disease and other diseases of the circulatory system: Secondary | ICD-10-CM | POA: Diagnosis not present

## 2019-01-31 DIAGNOSIS — I2510 Atherosclerotic heart disease of native coronary artery without angina pectoris: Secondary | ICD-10-CM | POA: Diagnosis present

## 2019-01-31 DIAGNOSIS — I252 Old myocardial infarction: Secondary | ICD-10-CM | POA: Diagnosis not present

## 2019-01-31 DIAGNOSIS — K219 Gastro-esophageal reflux disease without esophagitis: Secondary | ICD-10-CM | POA: Diagnosis present

## 2019-01-31 LAB — BASIC METABOLIC PANEL
Anion gap: 10 (ref 5–15)
BUN: 27 mg/dL — ABNORMAL HIGH (ref 8–23)
CO2: 23 mmol/L (ref 22–32)
Calcium: 9.1 mg/dL (ref 8.9–10.3)
Chloride: 105 mmol/L (ref 98–111)
Creatinine, Ser: 3.21 mg/dL — ABNORMAL HIGH (ref 0.61–1.24)
GFR calc Af Amer: 22 mL/min — ABNORMAL LOW (ref >60)
GFR calc non Af Amer: 19 mL/min — ABNORMAL LOW (ref >60)
Glucose, Bld: 135 mg/dL — ABNORMAL HIGH (ref 70–99)
Potassium: 5 mmol/L (ref 3.5–5.1)
Sodium: 138 mmol/L (ref 135–145)

## 2019-01-31 LAB — GLUCOSE, CAPILLARY
Glucose-Capillary: 156 mg/dL — ABNORMAL HIGH (ref 70–99)
Glucose-Capillary: 162 mg/dL — ABNORMAL HIGH (ref 70–99)
Glucose-Capillary: 147 mg/dL — ABNORMAL HIGH (ref 70–99)

## 2019-01-31 LAB — MAGNESIUM: Magnesium: 2 mg/dL (ref 1.7–2.4)

## 2019-01-31 NOTE — Progress Notes (Addendum)
   Subjective: Patient reports scrotal edema and leg edema is improving.  He is now able to put on his orthotics and ask for compression socks for his bilateral stumps. Denies any other concerns.   Objective:  Vital signs in last 24 hours: Vitals:   01/31/19 0006 01/31/19 0543 01/31/19 0732 01/31/19 1138  BP: 124/83 137/79 (!) 142/84 127/78  Pulse: 78 79 83 74  Resp: 18 18 16 16   Temp: 98.9 F (37.2 C) 98.6 F (37 C) 99.1 F (37.3 C) 99.4 F (37.4 C)  TempSrc: Oral Oral Oral Oral  SpO2: 96% 95% 98% 100%  Weight:  86.2 kg     General : No acute distress, normal-appearing Cardiovascular: Regular rate and rhythm, systolic murmur Pulmonary: Rales in the bases, upper lung fields are clear.   Extremities: Bilateral above the knee amputation, pitting edema of the thighs, scrotal edema (improving)   Assessment/Plan:  Active Problems:   Hypertension   Normocytic anemia   CKD (chronic kidney disease), stage IV (HCC)   Acute on chronic systolic heart failure [facility])  Endocarditis 68 year old male with CAD s/p CABG. hemic cardiomyopathy, systolic heart failure (EF 35-40% 08/2018), HTN , CVA, PVD s/p bilateral BKA's, HLD, CKD 4, GERD, and anemia of chronic disease who presented with acute on chronic HFrEF.  #Acute on chronic HFrEF, ischemic cardiomyopathy Net negative approximately 1.6 L .87.5 kg down to 86.2 kg.  Minimal change in GFR. JVD and scrotal edema present, but improving.   -Strict ins and outs -Daily weights -Continue IV furosemide 40 mg twice daily -Continue metoprolol succinate 50 mg daily -Continue BiDil  #CKD stage IV -At baseline.  Slight creatinine bump, represents minimal change in GFR. -Continue to monitor  #PVD Continue atorvastatin and aspirin  #DM -Last A1c 5.4 -SSI while admitted  #Anemia of Chronic Disease  -Continue to monitor  Dispo: Anticipated discharge in approximately 1-2 day(s).   Tamsen Snider, MD PGY1  [phone]

## 2019-02-01 DIAGNOSIS — E1151 Type 2 diabetes mellitus with diabetic peripheral angiopathy without gangrene: Secondary | ICD-10-CM

## 2019-02-01 DIAGNOSIS — E1122 Type 2 diabetes mellitus with diabetic chronic kidney disease: Secondary | ICD-10-CM

## 2019-02-01 DIAGNOSIS — I255 Ischemic cardiomyopathy: Secondary | ICD-10-CM

## 2019-02-01 DIAGNOSIS — Z79899 Other long term (current) drug therapy: Secondary | ICD-10-CM

## 2019-02-01 DIAGNOSIS — Z7982 Long term (current) use of aspirin: Secondary | ICD-10-CM

## 2019-02-01 DIAGNOSIS — K219 Gastro-esophageal reflux disease without esophagitis: Secondary | ICD-10-CM

## 2019-02-01 DIAGNOSIS — D631 Anemia in chronic kidney disease: Secondary | ICD-10-CM

## 2019-02-01 DIAGNOSIS — E785 Hyperlipidemia, unspecified: Secondary | ICD-10-CM

## 2019-02-01 DIAGNOSIS — N184 Chronic kidney disease, stage 4 (severe): Secondary | ICD-10-CM

## 2019-02-01 DIAGNOSIS — Z951 Presence of aortocoronary bypass graft: Secondary | ICD-10-CM

## 2019-02-01 DIAGNOSIS — I5023 Acute on chronic systolic (congestive) heart failure: Secondary | ICD-10-CM

## 2019-02-01 DIAGNOSIS — Z89511 Acquired absence of right leg below knee: Secondary | ICD-10-CM

## 2019-02-01 DIAGNOSIS — I2510 Atherosclerotic heart disease of native coronary artery without angina pectoris: Secondary | ICD-10-CM

## 2019-02-01 DIAGNOSIS — I130 Hypertensive heart and chronic kidney disease with heart failure and stage 1 through stage 4 chronic kidney disease, or unspecified chronic kidney disease: Principal | ICD-10-CM

## 2019-02-01 DIAGNOSIS — Z89512 Acquired absence of left leg below knee: Secondary | ICD-10-CM

## 2019-02-01 DIAGNOSIS — Z8673 Personal history of transient ischemic attack (TIA), and cerebral infarction without residual deficits: Secondary | ICD-10-CM

## 2019-02-01 LAB — BASIC METABOLIC PANEL
Anion gap: 10 (ref 5–15)
BUN: 27 mg/dL — ABNORMAL HIGH (ref 8–23)
CO2: 23 mmol/L (ref 22–32)
Calcium: 8.9 mg/dL (ref 8.9–10.3)
Chloride: 104 mmol/L (ref 98–111)
Creatinine, Ser: 3.49 mg/dL — ABNORMAL HIGH (ref 0.61–1.24)
GFR calc Af Amer: 20 mL/min — ABNORMAL LOW (ref >60)
GFR calc non Af Amer: 17 mL/min — ABNORMAL LOW (ref >60)
Glucose, Bld: 148 mg/dL — ABNORMAL HIGH (ref 70–99)
Potassium: 4.3 mmol/L (ref 3.5–5.1)
Sodium: 137 mmol/L (ref 135–145)

## 2019-02-01 LAB — GLUCOSE, CAPILLARY
Glucose-Capillary: 139 mg/dL — ABNORMAL HIGH (ref 70–99)
Glucose-Capillary: 159 mg/dL — ABNORMAL HIGH (ref 70–99)
Glucose-Capillary: 128 mg/dL — ABNORMAL HIGH (ref 70–99)
Glucose-Capillary: 171 mg/dL — ABNORMAL HIGH (ref 70–99)

## 2019-02-01 MED ORDER — GUAIFENESIN-DM 100-10 MG/5ML PO SYRP
5.0000 mL | ORAL_SOLUTION | ORAL | Status: DC | PRN
  Administered 2019-02-01: 5 mL via ORAL
  Filled 2019-02-01: qty 5

## 2019-02-01 NOTE — Progress Notes (Signed)
   Subjective:  Patient resting in bed and has stump socks on.  Reports his scrotal edema is only slightly better.  Discussed continued diuresis and patient voiced understanding.  Objective:  Vital signs in last 24 hours: Vitals:   01/31/19 1138 01/31/19 1617 01/31/19 2043 02/01/19 0410  BP: 127/78 116/70 120/74 114/70  Pulse: 74 76 72 72  Resp: 16 17 19 19   Temp: 99.4 F (37.4 C) 98.1 F (36.7 C) 98.2 F (36.8 C) 98.6 F (37 C)  TempSrc: Oral Oral Oral Oral  SpO2: 100% 97% 99% 100%  Weight:    85.2 kg   General : No acute distress, normal-appearing Cardiovascular: Regular rate and rhythm, systolic murmur Pulmonary: Rales in the bases, upper lung fields are clear.   Extremities: Bilateral above the knee amputation, pitting edema of the thighs, scrotal edema (improving)   Assessment/Plan:  Active Problems:   Hypertension   Normocytic anemia   CKD (chronic kidney disease), stage IV (HCC)   Acute on chronic systolic heart failure [facility])  Endocarditis 68 year old male with CAD s/p CABG. hemic cardiomyopathy, systolic heart failure (EF 35-40% 08/2018), HTN , CVA, PVD s/p bilateral BKA's, HLD, CKD 4, GERD, and anemia of chronic disease who presented with acute on chronic HFrEF.  #Acute on chronic HFrEF, ischemic cardiomyopathy Net negative  1.9 L in last 24 hrs. Net neg 2.2 L on admission..87.5 kg down to 85.2 kg.  Minimal change in GFR, but increase in Cr. Will continue to monitor.  JVD and scrotal edema present, but improving.   -Strict ins and outs -Daily weights -Continue IV furosemide 40 mg twice daily -Continue metoprolol succinate 50 mg daily -Continue BiDil  #CKD stage IV -At baseline.  Slight creatinine bump, represents minimal change in GFR.  -Continue to monitor  #PVD Continue atorvastatin and aspirin  #DM -Last A1c 5.4 -SSI while admitted  #Anemia of Chronic Disease  -Continue to monitor  Dispo: Anticipated discharge in approximately 1-2 day(s).   Tamsen Snider, MD PGY1  [phone]

## 2019-02-02 LAB — BASIC METABOLIC PANEL
Anion gap: 11 (ref 5–15)
BUN: 27 mg/dL — ABNORMAL HIGH (ref 8–23)
CO2: 25 mmol/L (ref 22–32)
Calcium: 8.9 mg/dL (ref 8.9–10.3)
Chloride: 103 mmol/L (ref 98–111)
Creatinine, Ser: 3.49 mg/dL — ABNORMAL HIGH (ref 0.61–1.24)
GFR calc Af Amer: 20 mL/min — ABNORMAL LOW (ref >60)
GFR calc non Af Amer: 17 mL/min — ABNORMAL LOW (ref >60)
Glucose, Bld: 120 mg/dL — ABNORMAL HIGH (ref 70–99)
Potassium: 4.4 mmol/L (ref 3.5–5.1)
Sodium: 139 mmol/L (ref 135–145)

## 2019-02-02 LAB — GLUCOSE, CAPILLARY
Glucose-Capillary: 122 mg/dL — ABNORMAL HIGH (ref 70–99)
Glucose-Capillary: 136 mg/dL — ABNORMAL HIGH (ref 70–99)

## 2019-02-02 MED ORDER — FUROSEMIDE 80 MG PO TABS: 80.0000 mg | ORAL_TABLET | Freq: Two times a day (BID) | ORAL | 0 refills | Status: DC

## 2019-02-02 MED ORDER — ISOSORB DINITRATE-HYDRALAZINE 20-37.5 MG PO TABS: 1.0000 | ORAL_TABLET | Freq: Three times a day (TID) | ORAL | 0 refills | Status: DC

## 2019-02-02 NOTE — Plan of Care (Signed)
  Problem: Nutrition: Goal: Adequate nutrition will be maintained Outcome: Completed/Met   Problem: Coping: Goal: Level of anxiety will decrease Outcome: Completed/Met   Problem: Elimination: Goal: Will not experience complications related to urinary retention Outcome: Completed/Met   Problem: Pain Managment: Goal: General experience of comfort will improve Outcome: Completed/Met   Problem: Skin Integrity: Goal: Risk for impaired skin integrity will decrease Outcome: Completed/Met

## 2019-02-02 NOTE — TOC Transition Note (Signed)
Transition of Care (TOC) - CM/SW Discharge Note Marvetta Gibbons RN, BSN Transitions of Care Unit 4E- RN Case Manager ([place]) [phone]   Patient Details  Name: CAMBRYN LEAZENBY MRN: [id number] Date of Birth: [birth year]  Transition of [facility]) CM/SW Contact:  Dawayne Patricia, RN Phone Number: 02/02/2019, 12:53 PM   Clinical Narrative:    Pt stable for transition home- requesting a rollator with a higher seat- CM spoke with pt at bedside- in house provider only has standard rollators- pt wants one that is higher- MD placed order and order printed and provided to pt so that he can go to local store to see if he can find one or order one like he wants.    Final next level of care: Home/Self Care Barriers to Discharge: No Barriers Identified   Patient Goals and CMS Choice Patient states their goals for this hospitalization and ongoing recovery are:: return home CMS Medicare.gov Compare Post Acute Care list provided to:: Patient Choice offered to / list presented to : Patient  Discharge Placement               Home/ self care        Discharge Plan and Services                DME Arranged: Walker rolling with seat DME Agency: (pt does not want standard one, will take to local store- order provided to pt)                  Social Determinants of Health (SDOH) Interventions     Readmission Risk Interventions Readmission Risk Prevention Plan 02/02/2019  Transportation Screening Complete  PCP or Specialist Appt within 5-7 Days Complete  Home Care Screening Complete  Medication Review (RN CM) Complete  Some recent data might be hidden

## 2019-02-02 NOTE — Progress Notes (Signed)
OT Cancellation Note and Discharge  Patient Details Name: [name] MRN: [id number] DOB: Dec 30, 1950   Cancelled Treatment:    Reason Eval/Treat Not Completed: OT screened, no needs identified, will sign off. Pt dressed and ready to go, spoke with him briefly and he reports he has all needed DME at home (except the rollator he will pick up on the way home) and feels he can manage all of his basic ADLs and IADLs as he was pta.   [place], OTR/L Acute Rehab Services Pager [phone] Office [phone]     Almon Register 02/02/2019, 12:49 PM

## 2019-02-02 NOTE — Progress Notes (Signed)
   Subjective:  Reports feeling like almost all the fluid is gone, maybe just a little more. Discussed he is getting morning IV lasix. He will be sent home on oral lasix and have follow up with PCP in a week. Patient feels ready for discharge and will have his sister pick him up.   Objective:  Vital signs in last 24 hours: Vitals:   02/01/19 1603 02/01/19 2026 02/02/19 0014 02/02/19 0410  BP: 120/74 111/72 114/71 118/64  Pulse: 72 74 77 75  Resp: 20 18 18 18   Temp: 99 F (37.2 C) 98.8 F (37.1 C) 98.9 F (37.2 C) 98.6 F (37 C)  TempSrc: Oral Oral Oral Oral  SpO2: 97% 99% 99% 98%  Weight:    82.3 kg   General : No acute distress, normal-appearing Cardiovascular: Regular rate and rhythm, systolic murmur.  Pulmonary: Rales improved, but in the bases, upper lung fields are clear.   Extremities: Bilateral above the knee amputation, thigh edema improved   Assessment/Plan:  Active Problems:   Hypertension   Normocytic anemia   CKD (chronic kidney disease), stage IV (HCC)   Acute on chronic systolic heart failure [facility])  Endocarditis 68 year old male with CAD s/p CABG. hemic cardiomyopathy, systolic heart failure (EF 35-40% 08/2018), HTN , CVA, PVD s/p bilateral BKA's, HLD, CKD 4, GERD, and anemia of chronic disease who presented with acute on chronic HFrEF.  #Acute on chronic HFrEF, ischemic cardiomyopathy Net negative  1 L in last 24 hrs. Net neg 4 L on admission, weight represent 87.5 kg down to 82.3 kg.  Appears almost euvolumic, will give patient morning dose of IV lasix and discharge with PO lasix. Follow up in clinic in 1 week.   -Strict ins and outs -Daily weights - Stop IV furosemide 40 mg twice daily - Start PO lasix 80 -Continue metoprolol succinate 50 mg daily -Continue BiDil  #CKD stage IV -At baseline.   -Continue to monitor  #PVD Continue atorvastatin and aspirin  #DM -Last A1c 5.4 -SSI while admitted  #Anemia of Chronic Disease  -Continue to monitor   Dispo: Anticipated discharge today.    Tamsen Snider, MD PGY1  [phone]

## 2019-02-02 NOTE — Progress Notes (Signed)
Discharge education and medication education given to patient  with teach back. Education on increasing activity slowly, and when to call MD given. All questions and concerns answered.  Peripheral IV and telemetry leads removed. All patient belongings given to patient, these include: clothing, and shoes, jewelry, cell phone and charger, and prosthetics. Patient currently waiting to speak with case manager regarding a rollator.

## 2019-02-02 NOTE — Evaluation (Addendum)
Physical Therapy Evaluation Patient Details Name: [name] MRN: [id number] DOB: 11-25-1950 Today's Date: 02/02/2019   History of Present Illness  Patient is a 68 year old gentleman with PMH of CAD status post CABG as well as CHF with reduced EF due to ischemic cardiomyopathy, HTN, CVA, PVD, s/p Bilat BKA, hyperlipidemia, CKD stage IV admitted with progressive LE and scrotal edema.  Clinical Impression  Patient presents close to his functional baseline.  Denies any recent falls at home and utilizes and cane and wheelchair normally.  He requested to try a rollator and demonstrated appropriate safe use.  Feel he would benefit from rollator for home with taller seat if able due to bilateral amputee.  PT not needed at d/c as pt functioning close to his baseline.  Feel stable for home when medically ready.  PT to follow up in case not d/c.     Follow Up Recommendations No PT follow up    Equipment Recommendations  Other (comment)(rollator with taller seat if able)    Recommendations for Other Services       Precautions / Restrictions Precautions Precautions: Fall Precaution Comments: bilat BKA Restrictions Weight Bearing Restrictions: No      Mobility  Bed Mobility Overal bed mobility: Modified Independent                Transfers Overall transfer level: Modified independent Equipment used: 4-wheeled walker             General transfer comment: donned bilateral prostheses seated EOB and sit to stand slightly elevated height to simulate bed at home without physical help  Ambulation/Gait Ambulation/Gait assistance: Supervision;Modified independent (Device/Increase time) Gait Distance (Feet): 100 Feet(x 2) Assistive device: 4-wheeled walker Gait Pattern/deviations: Step-to pattern;Step-through pattern;Trunk flexed     General Gait Details: using rollator appropriately locked brakes and turned to sit in elevator lobby with S, but good safe technique, then walked  back to his room, in small space around bed with wife rollator safely as well waiting for me to move bed over  Stairs            Wheelchair Mobility    Modified Rankin (Stroke Patients Only)       Balance Overall balance assessment: Needs assistance   Sitting balance-Leahy Scale: Good Sitting balance - Comments: donned prosthesis independent at EOB   Standing balance support: Single extremity supported Standing balance-Leahy Scale: Poor Standing balance comment: uses at least 1 UE support for balance                             Pertinent Vitals/Pain Pain Assessment: No/denies pain    Home Living Family/patient expects to be discharged to:: Private residence Living Arrangements: Alone Available Help at Discharge: Friend(s);Neighbor Type of Home: Apartment Home Access: Elevator     Home Layout: One level Home Equipment: Shower seat - built in;Grab bars - tub/shower;Grab bars - toilet;Wheelchair - manual;Cane - single point      Prior Function Level of Independence: Independent with assistive device(s)         Comments: drives and does ADL's/IADL's     Hand Dominance        Extremity/Trunk Assessment   Upper Extremity Assessment Upper Extremity Assessment: Overall WFL for tasks assessed    Lower Extremity Assessment Lower Extremity Assessment: RLE deficits/detail;LLE deficits/detail RLE Deficits / Details: BKA with ROM/strength WFL LLE Deficits / Details: BKA with ROM/strength [facility]       Communication  Communication: No difficulties  Cognition Arousal/Alertness: Awake/alert Behavior During Therapy: WFL for tasks assessed/performed Overall Cognitive Status: Within Functional Limits for tasks assessed                                        General Comments      Exercises     Assessment/Plan    PT Assessment Patient needs continued PT services  PT Problem List Decreased strength;Decreased balance;Decreased  knowledge of use of DME;Decreased mobility;Decreased safety awareness       PT Treatment Interventions DME instruction;Therapeutic activities;Balance training;Functional mobility training;Gait training;Patient/family education    PT Goals (Current goals can be found in the Care Plan section)  Acute Rehab PT Goals Patient Stated Goal: to go home today PT Goal Formulation: With patient Time For Goal Achievement: 02/09/19 Potential to Achieve Goals: Good    Frequency Min 3X/week   Barriers to discharge        Co-evaluation               AM-PAC PT "6 Clicks" Mobility  Outcome Measure Help needed turning from your back to your side while in a flat bed without using bedrails?: None Help needed moving from lying on your back to sitting on the side of a flat bed without using bedrails?: None Help needed moving to and from a bed to a chair (including a wheelchair)?: None Help needed standing up from a chair using your arms (e.g., wheelchair or bedside chair)?: None Help needed to walk in hospital room?: None Help needed climbing 3-5 steps with a railing? : A Little 6 Click Score: 23    End of Session Equipment Utilized During Treatment: Other (comment)(prostheses) Activity Tolerance: Patient tolerated treatment well Patient left: in chair;with call bell/phone within reach   PT Visit Diagnosis: Other abnormalities of gait and mobility (R26.89)    Time: [id number] PT Time Calculation (min) (ACUTE ONLY): 30 min   Charges:   PT Evaluation $PT Eval Low Complexity: 1 Low PT Treatments $Gait Training: 8-22 mins        Magda Kiel, [place] [phone] 02/02/2019   Reginia Naas 02/02/2019, 10:53 AM

## 2019-02-04 NOTE — Discharge Summary (Signed)
Name: [name] MRN: [id number] DOB: December 16, 1950 68 y.o. PCP: [name], MD  Date of Admission: 01/30/2019  8:40 AM Date of Discharge: 02/02/2019 Attending Physician: No att. providers found  Discharge Diagnosis: 1. Acute on chronic systolic heart failure 2. CKD stage 4 3. Peripheral Vascular Disease 4. Anemia of chronic   Discharge Medications: Allergies as of 02/02/2019   No Known Allergies     Medication List    STOP taking these medications   amLODipine 2.5 MG tablet Commonly known as: NORVASC   isosorbide mononitrate 30 MG 24 hr tablet Commonly known as: IMDUR     TAKE these medications   aspirin EC 81 MG tablet Take 81 mg by mouth every morning.   atorvastatin 40 MG tablet Commonly known as: LIPITOR Take 1 tablet (40 mg total) by mouth every morning.   furosemide 80 MG tablet Commonly known as: Lasix Take 1 tablet (80 mg total) by mouth 2 (two) times daily.   isosorbide-hydrALAZINE 20-37.5 MG tablet Commonly known as: BIDIL Take 1 tablet by mouth 3 (three) times daily.   metoprolol succinate 50 MG 24 hr tablet Commonly known as: TOPROL-XL Take 50 mg by mouth every morning.   nitroGLYCERIN 0.4 MG SL tablet Commonly known as: NITROSTAT Place 0.4 mg under the tongue every 5 (five) minutes as needed for chest pain.   pantoprazole 40 MG tablet Commonly known as: PROTONIX Take 1 tablet (40 mg total) by mouth daily at 6 (six) AM.   tamsulosin 0.4 MG Caps capsule Commonly known as: FLOMAX Take 0.4 mg by mouth every morning.   VITAMIN B-12 PO Take 1 tablet by mouth every morning.       Disposition and follow-up:   Thomas.[name] was discharged from [facility] in Good condition.  At the hospital follow up visit please address:  1. Acute on chronic HFrEF: At discharge 82.3kg. Please assess volume status and adjust diuretic dose as needed.  2. CKD stage 4: Check bmp to monitor function on new diuretic regimen. At  discharge creatinine 3.5  3. Peripheral Vascular Disease: Make sure he continues his atorvastatin and aspirin  4. Anemia of chronic disease: Chronically at hgb of ~9. Monitor   2.  Labs / imaging needed at time of follow-up: cbc, bmp  3.  Pending labs/ test needing follow-up: N/A  Follow-up Appointments: Follow-up Information    [name], MD.   Specialty: Internal Medicine Why: The office will call you with a follow up appt. Contact information: [address] [address] [place] Hospital Course by problem list: 1. Acute on chronic systolic heart failure: [name] is a 68 yo M w/ PMH of CAD s/p CABG, Ischemic cardiomyopathy, HTN, CVA, PVD s/p bilateral BKAs and anemia of chronic disease who presented with scrotal swelling. Ultrasound of his scrotum showed edema but no torsion or mass. BNp was at >4500. He was diuresed with IV furosemide for 3 days and his scrotal swelling improved. Discharged at weight of 82.3kg on oral furosemide.  2. CKD stage 4: Presented w/ CKD w/ Bun of 25, Creatinine of 3.02. He was diuresed with IV lasix and his creatinine rose to 3.49 but was stable at discharge:  3. Peripheral Vascular Disease: Continued home meds during hospitalization  4. Anemia of chronic disease: Presented w/ known anemia with hgb at 9.0. Discharged w/ recommendation to f/u with PCP  Discharge Vitals:  BP 109/69 (BP Location: Left Arm)   Pulse 81   Temp 98.3 F (36.8 C) (Oral)   Resp 18   Ht 5\' 10"  (1.778 m)   Wt 82.3 kg   SpO2 98%   BMI 26.03 kg/m   Pertinent Labs, Studies, and Procedures:  Ultrasound Scrotum  FINDINGS: Right testicle  Measurements: 2.8 x 2.0 x 1.6 centimeters. No mass or microlithiasis visualized.  Left testicle  Measurements: 2.6 x 1.7 x 1.7 centimeters. No mass or microlithiasis visualized. A small area of tubular ectasia of the rete testis is suspected (images 38 and 39), normal variant.  Right  epididymis:  Normal in size and appearance.  Left epididymis:  Normal in size and appearance.  Hydrocele: Small volume simple appearing left side hydrocele (image 37).  Varicocele:  None visualized.  Pulsed Doppler interrogation of both testes demonstrates normal low resistance arterial and venous waveforms bilaterally.  Other findings: Moderate to severe generalized scrotal edema (image 2). No evidence of soft tissue gas.  IMPRESSION: 1. Negative for testicular mass or torsion. 2. Moderate to severe generalized scrotal edema is nonspecific but suggestive of cellulitis. No ultrasound evidence of soft tissue gas. 3. Small probably postinflammatory left hydrocele.  BMP Latest Ref Rng & Units 02/02/2019 02/01/2019 01/31/2019  Glucose 70 - 99 mg/dL 120(H) 148(H) 135(H)  BUN 8 - 23 mg/dL 27(H) 27(H) 27(H)  Creatinine 0.61 - 1.24 mg/dL 3.49(H) 3.49(H) 3.21(H)  BUN/Creat Ratio 10 - 24 - - -  Sodium 135 - 145 mmol/L 139 137 138  Potassium 3.5 - 5.1 mmol/L 4.4 4.3 5.0  Chloride 98 - 111 mmol/L 103 104 105  CO2 22 - 32 mmol/L 25 23 23   Calcium 8.9 - 10.3 mg/dL 8.9 8.9 9.1  \ CBC Latest Ref Rng & Units 01/30/2019 01/29/2019 12/19/2018  WBC 4.0 - 10.5 K/uL 4.1 3.8 5.7  Hemoglobin 13.0 - 17.0 g/dL 9.3(L) 9.0(L) 8.4(L)  Hematocrit 39.0 - 52.0 % 29.3(L) 26.8(L) 26.6(L)  Platelets 150 - 400 K/uL 134(L) 182 173   Discharge Instructions: Discharge Instructions    (HEART FAILURE PATIENTS) Call MD:  Anytime you have any of the following symptoms: 1) 3 pound weight gain in 24 hours or 5 pounds in 1 week 2) shortness of breath, with or without a dry hacking cough 3) swelling in the hands, feet or stomach 4) if you have to sleep on extra pillows at night in order to breathe.   Complete by: As directed    Diet - low sodium heart healthy   Complete by: As directed    Discharge instructions   Complete by: As directed    Thomas. Price, it was a pleasure taking care of you. You were treated for  an exacerbation of your heart failure. We are sending you home on fluid pills which should help keep your swelling down. You will need to purchase a scale for when you are home to keep track of your daily weights (be sure to weigh your prosthetics beforehand). We also stopped two of your heart medications and started a new one called Bidil.   I will send a message to our clinic to have you follow-up next week for some lab work and to ensure you are doing well.   Take care!   Heart Failure patients record your daily weight using the same scale at the same time of day   Complete by: As directed    Increase activity slowly   Complete by: As directed    STOP  any activity that causes chest pain, shortness of breath, dizziness, sweating, or exessive weakness   Complete by: As directed       Signed:  Tamsen Snider, MD PGY1  [phone]

## 2019-02-06 ENCOUNTER — Encounter: Payer: Self-pay | Admitting: Internal Medicine

## 2019-02-06 ENCOUNTER — Other Ambulatory Visit: Payer: Self-pay

## 2019-02-06 ENCOUNTER — Ambulatory Visit (INDEPENDENT_AMBULATORY_CARE_PROVIDER_SITE_OTHER): Payer: Medicare HMO | Admitting: Internal Medicine

## 2019-02-06 VITALS — BP 134/68 | HR 87 | Temp 98.4°F | Ht 70.0 in | Wt 189.3 lb

## 2019-02-06 DIAGNOSIS — I130 Hypertensive heart and chronic kidney disease with heart failure and stage 1 through stage 4 chronic kidney disease, or unspecified chronic kidney disease: Secondary | ICD-10-CM

## 2019-02-06 DIAGNOSIS — I5022 Chronic systolic (congestive) heart failure: Secondary | ICD-10-CM

## 2019-02-06 DIAGNOSIS — E1122 Type 2 diabetes mellitus with diabetic chronic kidney disease: Secondary | ICD-10-CM | POA: Diagnosis not present

## 2019-02-06 DIAGNOSIS — I5023 Acute on chronic systolic (congestive) heart failure: Secondary | ICD-10-CM

## 2019-02-06 DIAGNOSIS — Z79899 Other long term (current) drug therapy: Secondary | ICD-10-CM

## 2019-02-06 DIAGNOSIS — N184 Chronic kidney disease, stage 4 (severe): Secondary | ICD-10-CM | POA: Diagnosis not present

## 2019-02-06 DIAGNOSIS — D649 Anemia, unspecified: Secondary | ICD-10-CM | POA: Diagnosis not present

## 2019-02-06 DIAGNOSIS — Z862 Personal history of diseases of the blood and blood-forming organs and certain disorders involving the immune mechanism: Secondary | ICD-10-CM | POA: Diagnosis not present

## 2019-02-06 NOTE — Progress Notes (Signed)
   CC: Follow up visit for hospital admission  HPI:[name] is a 68 y.o. male who presents for evaluation of Hospital discharge follow-up for acute on chronic heart failure exacerbation and chronic kidney disease. Please see individual problem based A/P for details.  Depression, PHQ-9: Based on the patients    Office Visit from 02/06/2019 in [place]  PHQ-9 Total Score  12     score we have decided to monitor.  Past Medical History:  Diagnosis Date  . Exertional shortness of breath    "before my last OR; I'm fine now" (09/26/2012)  . GERD (gastroesophageal reflux disease)   . High cholesterol   . Hx of seasonal allergies   . Hypertension   . Myocardial infarction ([place])   . Scrotal edema 01/30/2019  . 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 Type II diabetes mellitus ([place])    Review of Systems:  ROS negative except as per HPI.  Physical Exam: Vitals:   02/06/19 0915  BP: 134/68  Pulse: 87  Temp: 98.4 F (36.9 C)  TempSrc: Oral  SpO2: 100%  Weight: 189 lb 4.8 oz (85.9 kg)  Height: 5\' 10"  (1.778 m)   General: A/O x4, in no acute distress, afebrile, nondiaphoretic HEENT: PEERL, EMO intact Cardio: RRR, no mrg's  Pulmonary: CTA bilaterally, no wheezing or crackles  Abdomen: Bowel sounds normal, soft, nontender  MSK: Thighs are nontender, nonedematous Neuro: Alert, CNII-XII grossly intact, conversational, strength 5/5 in the upper and lower extremities bilaterally, normal gait Psych: Appropriate affect, not depressed in appearance, engages well  Assessment & Plan:   See Encounters Tab for problem based charting.  Patient discussed with Dr. Philipp Ovens.

## 2019-02-06 NOTE — Assessment & Plan Note (Signed)
History of normocytic anemia: Hemoglobin stable approximately 9.  We will repeat CBC to ensure stability is today  Plan:  CBC today

## 2019-02-06 NOTE — Progress Notes (Signed)
Internal Medicine Clinic Attending  Case discussed with Dr. Harbrecht at the time of the visit.  We reviewed the resident's history and exam and pertinent patient test results.  I agree with the assessment, diagnosis, and plan of care documented in the resident's note.   

## 2019-02-06 NOTE — Progress Notes (Signed)
Internal Medicine Clinic Attending  Case discussed with Dr. Lee at the time of the visit.  We reviewed the resident's history and exam and pertinent patient test results.  I agree with the assessment, diagnosis, and plan of care documented in the resident's note.  Alexander Raines, M.D., Ph.D.  

## 2019-02-06 NOTE — Assessment & Plan Note (Signed)
CKD stage IV: Patient requires gentle diuresis given his chronic and disease. Felt to be 2/2 to HTN and diabetic neuropathy.    Plan:  BMP today Continue to control BP and diabetes

## 2019-02-06 NOTE — Assessment & Plan Note (Signed)
Acute on Chronic HFrEF exacerbation follow-up: Hospital discharge follow-up.  Patient's weight was 95 kg on admission.  Weight 82.3 on discharge. Today is weight is 85.9. Up 3.6kg 6 discharge.  Patient states that he has continued to diurese quite aggressively at home on the 80 mg of Lasix twice daily.  He states that while in the hospital he was weighed with only hospital gown on today he was playing with full close including a sweatpants and a jacket.  He does not wish to adjust his medication regimen today. On exam there is no evidence of marked edema of the thighs or abdomen.  Lungs are clear to auscultation bilaterally.  No JVD on exam.  He denies symptoms such as dyspnea and states that his thigh is a much less swollen as compared to when he was admitted to the hospital.  Plan: BMP today for creatinine and potassium Continue Lasix 80 mg daily Return 2 weeks for repeat weight and BMP

## 2019-02-06 NOTE — Patient Instructions (Signed)
FOLLOW-UP INSTRUCTIONS When: 2 wks For: Repeat Weight check What to bring: All of your medications  I have not made any changes to your medications today.   Today we discussed your recent admission for a heart failure exacerbation and chronic kidney disease.  Please continue to take the Lasix 80 mg twice daily that you were discharged with.  We will repeat labs today to measure kidney function and potassium to make certain they are normal.  It appears that you have gained approximately 7 pounds since discharge.  I would like for you to return in 2 weeks for a repeat weight check. I will notify you of the results of any labs from today's evaluation when available to me.   Thank you for your visit to the [facility] today. If you have any questions or concerns please call us at [phone].

## 2019-02-07 ENCOUNTER — Ambulatory Visit: Payer: Medicare HMO

## 2019-02-07 LAB — BMP8+ANION GAP
Anion Gap: 13 mmol/L (ref 10.0–18.0)
BUN/Creatinine Ratio: 9 — ABNORMAL LOW (ref 10–24)
BUN: 29 mg/dL — ABNORMAL HIGH (ref 8–27)
CO2: 21 mmol/L (ref 20–29)
Calcium: 8.1 mg/dL — ABNORMAL LOW (ref 8.6–10.2)
Chloride: 98 mmol/L (ref 96–106)
Creatinine, Ser: 3.06 mg/dL — ABNORMAL HIGH (ref 0.76–1.27)
GFR calc Af Amer: 23 mL/min/{1.73_m2} — ABNORMAL LOW (ref >59)
GFR calc non Af Amer: 20 mL/min/{1.73_m2} — ABNORMAL LOW (ref >59)
Glucose: 86 mg/dL (ref 65–99)
Potassium: 4.7 mmol/L (ref 3.5–5.2)
Sodium: 132 mmol/L — ABNORMAL LOW (ref 134–144)

## 2019-02-07 LAB — CBC
Hematocrit: 28.4 % — ABNORMAL LOW (ref 37.5–51.0)
Hemoglobin: 9.4 g/dL — ABNORMAL LOW (ref 13.0–17.7)
MCH: 28.6 pg (ref 26.6–33.0)
MCHC: 33.1 g/dL (ref 31.5–35.7)
MCV: 86 fL (ref 79–97)
Platelets: 199 10*3/uL (ref 150–450)
RBC: 3.29 x10E6/uL — ABNORMAL LOW (ref 4.14–5.80)
RDW: 18.5 % — ABNORMAL HIGH (ref 11.6–15.4)
WBC: 5.2 10*3/uL (ref 3.4–10.8)

## 2019-02-20 ENCOUNTER — Other Ambulatory Visit: Payer: Self-pay

## 2019-02-20 ENCOUNTER — Encounter: Payer: Self-pay | Admitting: Internal Medicine

## 2019-02-20 ENCOUNTER — Ambulatory Visit (INDEPENDENT_AMBULATORY_CARE_PROVIDER_SITE_OTHER): Payer: Medicare HMO | Admitting: Internal Medicine

## 2019-02-20 VITALS — BP 124/73 | HR 75 | Temp 98.0°F | Ht 70.0 in | Wt 180.5 lb

## 2019-02-20 DIAGNOSIS — Z79899 Other long term (current) drug therapy: Secondary | ICD-10-CM | POA: Diagnosis not present

## 2019-02-20 DIAGNOSIS — I5022 Chronic systolic (congestive) heart failure: Secondary | ICD-10-CM | POA: Diagnosis not present

## 2019-02-20 NOTE — Assessment & Plan Note (Signed)
Chronic HFrEF exacerbation: The patient returns today for evaluation from a recent clinic visit where he had been assessed post hospital discharge for acute on chronic CHF. He has presented today to ensure stable weight and renal function. Weight 85.9 on 12/01 which was up 3.6kg from his discharge weight but down 10kg from his admission weight. Today the patient weighs 81.9kg. He had a sCr of 3.06 2 weeks prior which was improved from his discharge sCr.  He denies cough, shortness of breath, chest pain, edema, weight gain, or other concerns today.  Plan: BMP today Continue furosemide 80mg  BID unless otherwise informed Return with PCP in February

## 2019-02-20 NOTE — Patient Instructions (Signed)
FOLLOW-UP INSTRUCTIONS When: With your doctor in February as previously scheduled For: Routine visit What to bring: All of your medications  Your weight appears to be stable today.  I will obtain a lab to make sure your kidney function looks good and if everything is normal we will not make any changes to your medication regimen.  I will notify you of your lab results when they are available to me.  For now continue your current medication regimen and call us with any questions between now and February.  Thank you for your visit to the [facility] today. If you have any questions or concerns please call us at [phone].

## 2019-02-20 NOTE — Progress Notes (Signed)
   CC: Recent acute heart failure exacerbation follow-up  HPI:[name] is a 68 y.o. male who presents for evaluation of weight and BP check from a recent CHF exacerbation hospitalization. Please see individual problem based A/P for details.  Past Medical History:  Diagnosis Date  . Exertional shortness of breath    "before my last OR; I'm fine now" (09/26/2012)  . GERD (gastroesophageal reflux disease)   . High cholesterol   . Hx of seasonal allergies   . Hypertension   . Myocardial infarction ([place])   . Scrotal edema 01/30/2019  . Stroke [facility]) 2009   memory loss  . Type II diabetes mellitus ([place])    Review of Systems:  ROS negative except as per HPI.  Physical Exam: There were no vitals filed for this visit. General: A/O x4, in no acute distress, afebrile, nondiaphoretic HEENT: PEERL, EMO intact Cardio: RRR, no mrg's  Pulmonary: CTA bilaterally, no wheezing or crackles  Abdomen: Bowel sounds normal, soft, nontender  Neuro: Alert, CNII-XII grossly intact, conversational, strength 5/5 in the upper and lower extremities bilaterally, normal gait Psych: Appropriate affect, not depressed in appearance, engages well  Assessment & Plan:   See Encounters Tab for problem based charting.  Patient discussed with Dr. Philipp Ovens

## 2019-02-20 NOTE — Progress Notes (Signed)
Internal Medicine Clinic Attending  Case discussed with Dr. Harbrecht at the time of the visit.  We reviewed the resident's history and exam and pertinent patient test results.  I agree with the assessment, diagnosis, and plan of care documented in the resident's note.   

## 2019-02-21 LAB — BMP8+ANION GAP
Anion Gap: 14 mmol/L (ref 10.0–18.0)
BUN/Creatinine Ratio: 17 (ref 10–24)
BUN: 46 mg/dL — ABNORMAL HIGH (ref 8–27)
CO2: 20 mmol/L (ref 20–29)
Calcium: 8.9 mg/dL (ref 8.6–10.2)
Chloride: 105 mmol/L (ref 96–106)
Creatinine, Ser: 2.68 mg/dL — ABNORMAL HIGH (ref 0.76–1.27)
GFR calc Af Amer: 27 mL/min/{1.73_m2} — ABNORMAL LOW (ref >59)
GFR calc non Af Amer: 23 mL/min/{1.73_m2} — ABNORMAL LOW (ref >59)
Glucose: 96 mg/dL (ref 65–99)
Potassium: 4.3 mmol/L (ref 3.5–5.2)
Sodium: 139 mmol/L (ref 134–144)

## 2019-02-24 ENCOUNTER — Other Ambulatory Visit: Payer: Self-pay | Admitting: Internal Medicine

## 2019-03-13 ENCOUNTER — Other Ambulatory Visit: Payer: Self-pay | Admitting: Internal Medicine

## 2019-03-13 DIAGNOSIS — I5022 Chronic systolic (congestive) heart failure: Secondary | ICD-10-CM

## 2019-04-10 ENCOUNTER — Encounter: Payer: Self-pay | Admitting: Internal Medicine

## 2019-04-10 ENCOUNTER — Other Ambulatory Visit: Payer: Self-pay

## 2019-04-10 ENCOUNTER — Ambulatory Visit (INDEPENDENT_AMBULATORY_CARE_PROVIDER_SITE_OTHER): Payer: Medicare HMO | Admitting: Internal Medicine

## 2019-04-10 DIAGNOSIS — Z89512 Acquired absence of left leg below knee: Secondary | ICD-10-CM

## 2019-04-10 DIAGNOSIS — W06XXXA Fall from bed, initial encounter: Secondary | ICD-10-CM

## 2019-04-10 DIAGNOSIS — Z89511 Acquired absence of right leg below knee: Secondary | ICD-10-CM | POA: Diagnosis not present

## 2019-04-10 DIAGNOSIS — S81812A Laceration without foreign body, left lower leg, initial encounter: Secondary | ICD-10-CM

## 2019-04-10 DIAGNOSIS — S81802A Unspecified open wound, left lower leg, initial encounter: Secondary | ICD-10-CM | POA: Insufficient documentation

## 2019-04-10 NOTE — Patient Instructions (Addendum)
[name] have a small skin tear in your knee.This should heal on its own with time. IF you notice any signs of infection (fever, redness and warmth in the wound area) call us and let us know.  Call us if you have any questions or concerns.   - Dr. Frederico Hamman

## 2019-04-10 NOTE — Progress Notes (Signed)
   CC: "Bruised knees"  HPI:  [name] is a 69 y.o. year-old male with PMH listed below who presents to clinic for bruised knees. Please see problem based assessment and plan for further details.   Past Medical History:  Diagnosis Date  . Exertional shortness of breath    "before my last OR; I'm fine now" (09/26/2012)  . GERD (gastroesophageal reflux disease)   . High cholesterol   . Hx of seasonal allergies   . Hypertension   . Myocardial infarction ([place])   . Scrotal edema 01/30/2019  . Stroke [facility]) 2009   memory loss  . Type II diabetes mellitus ([place])    Review of Systems:   Review of Systems  Constitutional: Negative for chills and fever.  Musculoskeletal: Positive for falls and myalgias. Negative for joint pain.    Physical Exam:  Vitals:   04/10/19 0835  BP: (!) 146/81  Pulse: 84  Temp: 98.2 F (36.8 C)  TempSrc: Oral  SpO2: 100%  Weight: 190 lb 9.6 oz (86.5 kg)    General: Chronically ill-appearing male in no acute distress MSK: 3.5 cm skin tear actively bleeding over left tibial tuberosity, no knee pain, full range of motion of left knee joint   Assessment & Plan:   See Encounters Tab for problem based charting.  Patient discussed with Dr. Angelia Mould

## 2019-04-10 NOTE — Assessment & Plan Note (Addendum)
Patient was in his usual state of health until this AM when he fell while transferring from his bed to chair (he is s/p bilateral BKAs). He fell on his knees. No head trauma. Not on any blood thinners. He has 3.5 cm skin tear on L tibial tuberosity that is actively bleeding (see picture on media tab). There is mild tenderness around wound. He has full ROM at L knee joint.  - Pressure applied with gauze x 5 minutes, hemostasis achieved  - Wound cleaned and dressed w/ hydrocolloid dressing  - Follow up in 1 week  - Return precautions discussed (advised to call or make appt if develops s/s of infection)

## 2019-04-11 NOTE — Progress Notes (Signed)
Internal Medicine Clinic Attending  Case discussed with Dr. Santos-Sanchez soon after the resident saw the patient.  We reviewed the resident's history and exam and pertinent patient test results.  I agree with the assessment, diagnosis, and plan of care documented in the resident's note.   

## 2019-04-17 ENCOUNTER — Other Ambulatory Visit: Payer: Self-pay

## 2019-04-17 ENCOUNTER — Encounter: Payer: Self-pay | Admitting: Radiation Oncology

## 2019-04-17 ENCOUNTER — Telehealth: Payer: Self-pay | Admitting: Internal Medicine

## 2019-04-17 ENCOUNTER — Ambulatory Visit (INDEPENDENT_AMBULATORY_CARE_PROVIDER_SITE_OTHER): Payer: Medicare HMO | Admitting: Radiation Oncology

## 2019-04-17 VITALS — BP 133/80 | HR 88 | Temp 98.8°F | Wt 180.4 lb

## 2019-04-17 DIAGNOSIS — I5022 Chronic systolic (congestive) heart failure: Secondary | ICD-10-CM

## 2019-04-17 DIAGNOSIS — Z23 Encounter for immunization: Secondary | ICD-10-CM | POA: Diagnosis not present

## 2019-04-17 DIAGNOSIS — I10 Essential (primary) hypertension: Secondary | ICD-10-CM

## 2019-04-17 DIAGNOSIS — Z79899 Other long term (current) drug therapy: Secondary | ICD-10-CM

## 2019-04-17 DIAGNOSIS — I110 Hypertensive heart disease with heart failure: Secondary | ICD-10-CM

## 2019-04-17 DIAGNOSIS — Z89511 Acquired absence of right leg below knee: Secondary | ICD-10-CM | POA: Diagnosis not present

## 2019-04-17 DIAGNOSIS — S81802D Unspecified open wound, left lower leg, subsequent encounter: Secondary | ICD-10-CM

## 2019-04-17 DIAGNOSIS — X58XXXD Exposure to other specified factors, subsequent encounter: Secondary | ICD-10-CM | POA: Diagnosis not present

## 2019-04-17 DIAGNOSIS — Z89512 Acquired absence of left leg below knee: Secondary | ICD-10-CM | POA: Diagnosis not present

## 2019-04-17 MED ORDER — FUROSEMIDE 80 MG PO TABS: 80.0000 mg | ORAL_TABLET | Freq: Two times a day (BID) | ORAL | 3 refills | Status: DC

## 2019-04-17 MED ORDER — ISOSORBIDE MONONITRATE ER 30 MG PO TB24: 30.0000 mg | ORAL_TABLET | Freq: Every day | ORAL | 0 refills | Status: DC

## 2019-04-17 MED ORDER — ISOSORB DINITRATE-HYDRALAZINE 20-37.5 MG PO TABS: 1.0000 | ORAL_TABLET | Freq: Three times a day (TID) | ORAL | 3 refills | Status: DC

## 2019-04-17 MED ORDER — HYDRALAZINE HCL 25 MG PO TABS: 25.0000 mg | ORAL_TABLET | Freq: Three times a day (TID) | ORAL | 0 refills | Status: DC

## 2019-04-17 NOTE — Assessment & Plan Note (Signed)
Patient here for acute visit for left leg wound. His blood pressure is well controlled at 133/80 today. He has no lower extremity edema or complaints of volume overload today. He is not in respiratory distress. He is in need of refills.  Plan: -refill Furosemide and Bidil

## 2019-04-17 NOTE — Telephone Encounter (Signed)
Pls contact pt regarding blood pressure medicine too high [phone]

## 2019-04-17 NOTE — Telephone Encounter (Signed)
Bidil was originally prescribed by his cardiologist. He can make a f/u appointment with them and they may be able to provide assistance with paying for the med. Meanwhile, I can send Imdur+hydralazine which would be cheaper. I'll give him a call.

## 2019-04-17 NOTE — Assessment & Plan Note (Signed)
Patient here for follow up visit of left leg wound. He kept his bandage in place for 5 days. Today it looks improved. He denies pain, redness, fever or chills. On exam the wound appears improved. There is no bleeding, erythema, pain or fluctuance.    Plan: -redress wound -fu in 1 week  -return precautions

## 2019-04-17 NOTE — Telephone Encounter (Signed)
Called Mr.Strayer and left message instructing him to pick up Imdur and hydralazine from his pharmacy to replace the Bidil. Callback number provided.

## 2019-04-17 NOTE — Progress Notes (Signed)
Internal Medicine Clinic Attending  Case discussed with Dr. Lanierat the time of the visit.  We reviewed the resident's history and exam and pertinent patient test results.  I agree with the assessment, diagnosis, and plan of care documented in the resident's note.   

## 2019-04-17 NOTE — Patient Instructions (Signed)
Thank you for coming to your appointment. It was so nice to see you. Today we discussed  Diagnoses and all orders for this visit:  Wound of left lower extremity, subsequent encounter       -wound looks to be improving, keep the bandage on for at least 5       days       -let us know if you develop redness, pain, fever or chills  Chronic Systolic Heart Failure and High Blood Pressure -     furosemide (LASIX) 80 MG tablet; Take 1 tablet (80 mg total) by mouth 2 (two) times daily. -     isosorbide-hydrALAZINE (BIDIL) 20-37.5 MG tablet; Take 1 tablet by mouth 3 (three) times daily.  Sincerely,  Al Decant, MD

## 2019-04-17 NOTE — Telephone Encounter (Signed)
Called pt and he does not know what is so high but thinks it is BP med, will call [facility]. The BI[facility]DIL has a copay of 285.00 this is probably because of deductible. Could we change to something else?

## 2019-04-17 NOTE — Progress Notes (Signed)
   CC: left leg wound  HPI:  [name] is a 69 y.o. male who presents to the Internal Medicine Clinic for follow up of a left lower extremity wound. Please see Assessment and Plan for full HPI.  Past Medical History:  Diagnosis Date  . Exertional shortness of breath    "before my last OR; I'm fine now" (09/26/2012)  . GERD (gastroesophageal reflux disease)   . High cholesterol   . Hx of seasonal allergies   . Hypertension   . Myocardial infarction ([place])   . Scrotal edema 01/30/2019  . Stroke [facility]) 2009   memory loss  . Type II diabetes mellitus ([place])    Review of Systems:  Please see Assessment and Plan for full ROS.  Physical Exam:  Vitals:   04/17/19 0834  BP: 133/80  Pulse: 88  Temp: 98.8 F (37.1 C)  TempSrc: Oral  SpO2: 100%   Physical Exam  Constitutional: No distress.  Cardiovascular:  No LE edema  Pulmonary/Chest: Effort normal. No respiratory distress.  Musculoskeletal:        General: Normal range of motion.     Comments: Bilateral BKA  Neurological: He is alert.  Skin: Skin is warm and dry. He is not diaphoretic.  No bleeding, no surrounding erythema or tenderness of LLE wound  Psychiatric: Affect normal.      Assessment & Plan:   See Encounters Tab for problem based charting.  Patient discussed with Dr. Philipp Ovens

## 2019-04-17 NOTE — Telephone Encounter (Signed)
RTC, pt states nurse already called him back. scrn

## 2019-04-17 NOTE — Assessment & Plan Note (Addendum)
Patient here for acute visit for left leg wound. He has no lower extremity edema or complaints of volume overload today. He is not in respiratory distress. He is in need of refills.  Plan: -refill Furosemide and Bidil

## 2019-04-24 ENCOUNTER — Encounter: Payer: Self-pay | Admitting: Internal Medicine

## 2019-04-24 ENCOUNTER — Other Ambulatory Visit: Payer: Self-pay

## 2019-04-24 ENCOUNTER — Ambulatory Visit (INDEPENDENT_AMBULATORY_CARE_PROVIDER_SITE_OTHER): Payer: Medicare HMO | Admitting: Internal Medicine

## 2019-04-24 VITALS — BP 140/77 | HR 87 | Temp 98.3°F | Ht 69.0 in | Wt 180.0 lb

## 2019-04-24 DIAGNOSIS — S81002D Unspecified open wound, left knee, subsequent encounter: Secondary | ICD-10-CM | POA: Diagnosis not present

## 2019-04-24 DIAGNOSIS — X58XXXD Exposure to other specified factors, subsequent encounter: Secondary | ICD-10-CM

## 2019-04-24 DIAGNOSIS — S81802D Unspecified open wound, left lower leg, subsequent encounter: Secondary | ICD-10-CM

## 2019-04-24 NOTE — Progress Notes (Signed)
   CC: Wound follow-up  HPI:  [name] is a 69 y.o. year-old male with PMH listed below who presents to clinic for wound follow-up. Please see problem based assessment and plan for further details.   Past Medical History:  Diagnosis Date  . Exertional shortness of breath    "before my last OR; I'm fine now" (09/26/2012)  . GERD (gastroesophageal reflux disease)   . High cholesterol   . Hx of seasonal allergies   . Hypertension   . Myocardial infarction ([place])   . Scrotal edema 01/30/2019  . 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 Type II diabetes mellitus ([place])    Review of Systems:   Review of Systems  Constitutional: Negative for chills, fever and malaise/fatigue.  Musculoskeletal: Negative for joint pain and myalgias.    Physical Exam:  Vitals:   04/24/19 0836  BP: 140/77  Pulse: 87  Temp: 98.3 F (36.8 C)  TempSrc: Oral  SpO2: 100%  Weight: 180 lb (81.6 kg)  Height: 5\' 9"  (1.753 m)    General: Well-appearing male in no acute distress Derm: Wound on left knee appears to be healing well, granulation tissue present, no signs of infection noted      Assessment & Plan:   See Encounters Tab for problem based charting.  Patient discussed with Dr. Angelia Mould

## 2019-04-24 NOTE — Assessment & Plan Note (Addendum)
Patient presents for wound follow-up.  He sustained trauma after fall about 2 weeks ago and has been following up with [place] for wound care.  Wound has been healing well.  Granulation tissue noted today.  No signs of infection around it.  He denies systemic signs of infection as well.  Dressing change today and supplies given to him for dressing changes at home.  Follow-up in 2 weeks.

## 2019-04-24 NOTE — Progress Notes (Signed)
Internal Medicine Clinic Attending  Case discussed with Dr. Santos-Sanchez at the time of the visit.  We reviewed the resident's history and exam and pertinent patient test results.  I agree with the assessment, diagnosis, and plan of care documented in the resident's note.    

## 2019-04-24 NOTE — Patient Instructions (Addendum)
Mr. Kempson,   Your wound appears to be healing well. Continue the dressing changes as discussed. Call us if you start experiencing any fever or chills or if the area becomes red and tender.   Make a follow up appointment with [place] in 2 weeks.   - Dr. Frederico Hamman

## 2019-05-01 ENCOUNTER — Encounter: Payer: Self-pay | Admitting: Internal Medicine

## 2019-05-01 ENCOUNTER — Ambulatory Visit (INDEPENDENT_AMBULATORY_CARE_PROVIDER_SITE_OTHER): Payer: Medicare HMO | Admitting: Internal Medicine

## 2019-05-01 VITALS — BP 132/72 | HR 87 | Temp 99.0°F | Ht 69.0 in | Wt 182.8 lb

## 2019-05-01 DIAGNOSIS — S81002D Unspecified open wound, left knee, subsequent encounter: Secondary | ICD-10-CM | POA: Diagnosis not present

## 2019-05-01 DIAGNOSIS — D631 Anemia in chronic kidney disease: Secondary | ICD-10-CM | POA: Diagnosis not present

## 2019-05-01 DIAGNOSIS — I255 Ischemic cardiomyopathy: Secondary | ICD-10-CM | POA: Diagnosis not present

## 2019-05-01 DIAGNOSIS — I739 Peripheral vascular disease, unspecified: Secondary | ICD-10-CM

## 2019-05-01 DIAGNOSIS — R350 Frequency of micturition: Secondary | ICD-10-CM

## 2019-05-01 DIAGNOSIS — Z79899 Other long term (current) drug therapy: Secondary | ICD-10-CM

## 2019-05-01 DIAGNOSIS — S81802D Unspecified open wound, left lower leg, subsequent encounter: Secondary | ICD-10-CM

## 2019-05-01 DIAGNOSIS — W19XXXD Unspecified fall, subsequent encounter: Secondary | ICD-10-CM

## 2019-05-01 DIAGNOSIS — N184 Chronic kidney disease, stage 4 (severe): Secondary | ICD-10-CM | POA: Diagnosis not present

## 2019-05-01 DIAGNOSIS — Z89511 Acquired absence of right leg below knee: Secondary | ICD-10-CM

## 2019-05-01 DIAGNOSIS — E785 Hyperlipidemia, unspecified: Secondary | ICD-10-CM

## 2019-05-01 DIAGNOSIS — I130 Hypertensive heart and chronic kidney disease with heart failure and stage 1 through stage 4 chronic kidney disease, or unspecified chronic kidney disease: Secondary | ICD-10-CM

## 2019-05-01 DIAGNOSIS — I5022 Chronic systolic (congestive) heart failure: Secondary | ICD-10-CM

## 2019-05-01 DIAGNOSIS — I2510 Atherosclerotic heart disease of native coronary artery without angina pectoris: Secondary | ICD-10-CM | POA: Diagnosis not present

## 2019-05-01 DIAGNOSIS — K219 Gastro-esophageal reflux disease without esophagitis: Secondary | ICD-10-CM

## 2019-05-01 DIAGNOSIS — Z89512 Acquired absence of left leg below knee: Secondary | ICD-10-CM

## 2019-05-01 DIAGNOSIS — I10 Essential (primary) hypertension: Secondary | ICD-10-CM

## 2019-05-01 DIAGNOSIS — Z951 Presence of aortocoronary bypass graft: Secondary | ICD-10-CM

## 2019-05-01 NOTE — Progress Notes (Signed)
CC: Knee wound  HPI: [name] is a 69 y.o. M w/ PMH of CAD s/p CABG, ischemic cardiomyopathy, systolic heart failure (EF 35-40% 08/2018), HTN, CVA,  PVD s/p bilateral BKAs, HLD, CKD4, GERD and anemia of chronic disease who presents to [facility] for management of his chronic conditions. He was recently seen in [place] for knee wound that occurred after a fall. He mentions that his wound has been healing well without significant drainage, erythema or pain. Denies any fevers, chills, nausea, vomiting. He mentions that he is also taking his diuretics and mentions having to 'go to the bathroom too often' but otherwise feels well. He denies any chest pain, palpitations, shortness of breath or orthopnea. He states he was able to pick up his hydralazine and isosorbide mononitrate and is taking them without significant side effects. When inquired about his follow up appointment with his cardiologist, he mentions that he does not want to go because 'they never do nothing.'  Past Medical History:  Diagnosis Date  . Exertional shortness of breath    "before my last OR; I'm fine now" (09/26/2012)  . GERD (gastroesophageal reflux disease)   . High cholesterol   . Hx of seasonal allergies   . Hypertension   . Myocardial infarction ([place])   . Scrotal edema 01/30/2019  . Stroke [facility]) 2009   memory loss  . Type II diabetes mellitus ([place])    Review of Systems: Review of Systems  Constitutional: Negative for chills, fever and malaise/fatigue.  Eyes: Negative for blurred vision.  Respiratory: Negative for shortness of breath.   Cardiovascular: Negative for chest pain, palpitations and leg swelling.  Gastrointestinal: Negative for constipation, diarrhea, nausea and vomiting.  Genitourinary: Positive for frequency.  Neurological: Negative for dizziness and headaches.     Physical Exam: Vitals:   05/01/19 1318 05/01/19 1338  BP: 136/70 132/72  Pulse: 87   Temp: 99 F (37.2 C)   TempSrc: Oral   SpO2:  100%   Weight: 182 lb 12.8 oz (82.9 kg)   Height: 5\' 9"  (1.753 m)    Physical Exam  Constitutional: He is oriented to person, place, and time. He appears well-developed and well-nourished. No distress.  HENT:  Mouth/Throat: Oropharynx is clear and moist.  Eyes: Conjunctivae are normal.  Neck: No JVD present.  Cardiovascular: Normal rate, regular rhythm, normal heart sounds and intact distal pulses.  No murmur heard. Respiratory: Effort normal and breath sounds normal. He has no wheezes. He has no rales.  GI: Soft. Bowel sounds are normal. He exhibits no distension. There is no abdominal tenderness.  Musculoskeletal:        General: Deformity (Bilateral BKAs) present. Normal range of motion.     Cervical back: Normal range of motion and neck supple.  Neurological: He is alert and oriented to person, place, and time.  Skin: Skin is warm and dry.  Well healing wound on anterior L knee pictured below       Assessment & Plan:   CKD (chronic kidney disease), stage IV ([place]) Lab Results  Component Value Date   CREATININE 2.75 (H) 05/01/2019   Continues to have stable creatinine. Baseline around 2.7. Currently on furosemide. Would benefit from titrating off loop diuretic eventually but had hospitalization couple months ago for exacerbation. Will get bmp today to monitor renal fx. At discharge from prior hospitalization, was recommended to refer to nephrology but patient resistant to seeing additional specialist. Will check bmp today and continue to recommend f/u with nephrology  -  BMP   Hypertension BP Readings from Last 3 Encounters:  05/01/19 132/72  04/24/19 140/77  04/17/19 133/80   Above goal at this visit. On furosemide, Imdur, Hydralazine. Previously on lisinopril but discontinued at prior hospitalization due to AKI. Will need to restart as he has systolic heart failure.  - C/w current regimen - Will need to restart ACE or ARB, will check bmp today prior to  initiation  Addendum: Potassium on high-normal at 4.6. At risk for developing hyperkalemia due to CKD4 with initiation of Ace-inhibitor. Will monitor for now.  Chronic systolic heart failure (HCC) Wt Readings from Last 3 Encounters:  05/01/19 182 lb 12.8 oz (82.9 kg)  04/24/19 180 lb (81.6 kg)  04/17/19 180 lb 6.4 oz (81.8 kg)   [name] presents for management of his chronic conditions. Currently on furosemide 80mg  BID. Tolerating it well. Is complaining of frequent urination. Unable to afford Bidil so currently on Imdur and hydralazine. Discussed benefits of Entresto but states will not be afford medications with high-copay. Discussed restarting his lisinopril but states he's 'trying to get off all these meds' and mentions he would not like to start these new medications at this time. Euvolemic on exam. Weight 2 pounds over last known dry weight. Denies any chest pain, palpitations, dyspnea. Discussed continuing current management and following up with cardiology.  - C/w hydralazine, isosorbide mononitrate, furosemide 80mg  BID - BMP today  Leg wound, left Appear to be healing well on exam. No palpable effusion. No surrounding edema, erythema or tenderness. No fevers, chills, nausea, vomiting, malaise  - C/w keeping wound site clean   Patient discussed with Dr. Philipp Ovens   -Gilberto Better, PGY2 [facility] Pager: [phone]

## 2019-05-01 NOTE — Patient Instructions (Signed)
Thank you for allowing [place] to provide your care today. Today we discussed your heart failure and leg wound    I have ordered bmp labs for you. I will call if any are abnormal.    Today we made no changes to your medications.    Please follow-up in 3 months.    Should you have any questions or concerns please call the internal medicine clinic at [phone].     Preventing Pressure Injuries  A pressure injury, sometimes called a bedsore or a pressure ulcer, is an injury to the skin and underlying tissue caused by pressure. A pressure injury can happen when your skin presses against a surface, such as a mattress or wheelchair seat, for too long. The pressure on the blood vessels causes reduced blood flow to your skin. This can eventually cause the skin tissue to die and break down into a wound. Pressure injuries usually develop:  Over bony parts of the body, such as the tailbone, shoulders, elbows, hips, and heels.  Under medical devices, such as respiratory equipment, stockings, tubes, and splints. How can this condition affect me? Pressure injuries are caused by a lack of blood supply to an area of skin. These injuries begin as a reddened area on the skin and can become an open sore. They can result from intense pressure over a short period of time or from less pressure over a long period of time. Pressure injuries can vary in severity. They can cause pain, muscle damage, and infection. What can increase my risk? This condition is more likely to develop in people who:  Are in the hospital or an extended care facility.  Are bedridden or in a wheelchair.  Have an injury or disease that keeps them from: ? Moving normally. ? Feeling pain or pressure. ? Communicating if they feel pain or pressure.  Have a condition that: ? Makes them sleepy or less alert. ? Causes poor blood flow.  Need to wear a medical device.  Have poor control of their bladder or bowel functions  (incontinence).  Have poor nutrition (malnutrition).  Have had this condition before.  Are of certain ethnicities. People of African American, Latino, or Hispanic descent are at higher risk compared to other ethnic groups. What actions can I take to prevent pressure injuries? Reducing and redistributing pressure  Do not lie or sit in one position for a long time. Move or change position: ? Every hour when out of bed in a chair. ? Every two hours when in bed. ? As often as told by your health care provider.  Use pillows, wedges, or cushions to redistribute pressure. Ask your health care provider to recommend a mattress, cushions, or pads for you.  Use medical devices that do not rub your skin. Tell your health care provider if one of your medical devices is causing pain or irritation. Skin care If you are in the hospital, your health care providers:  Will inspect your skin, including areas under or around medical devices, at least twice a day.  May recommend that you use certain types of bedding to help prevent pressure injuries. These may include a pad, mattress, or chair cushion that is filled with gel, air, water, or foam.  Will evaluate your nutrition and consult a dietitian if needed.  Will inspect and change any wound dressings regularly.  May help you move into different positions every few hours.  Will adjust any medical devices and braces as needed to limit pressure on your  skin.  Will keep your skin clean and dry.  May use gentle cleansers and skin protectants if you are incontinent.  Will moisturize any dry skin. In general, at home:  Keep your skin clean and dry. Gently pat your skin dry.  Do not rub or massage bony areas of your skin.  Moisturize dry skin.  Use gentle cleansers and skin protectants routinely if you are incontinent.  Check your skin at least once a day for any changes in color and for any new blisters or sores. Make sure to check under and  around any medical devices and between skin folds. Have a caregiver do this for you if you are not able.  Lifestyle  Be as active as you can every day. Ask your health care provider to suggest safe exercises or activities.  Do not abuse drugs or alcohol.  Do not use any products that contain nicotine or tobacco, such as cigarettes, e-cigarettes, and chewing tobacco. If you need help quitting, ask your health care provider. General instructions   Take over-the-counter and prescription medicines only as told by your health care provider.  Work with your health care provider to manage any chronic health conditions.  Eat a healthy diet that includes protein, vitamins, and minerals. Ask your health care provider what types of food you should eat.  Drink enough fluid to keep your urine pale yellow.  Keep all follow-up visits as told by your health care provider. This is important. Contact a health care provider if you:  Feel or see any changes in your skin. Summary  A pressure injury, sometimes called a bedsore or a pressure ulcer, is an injury to the skin and underlying tissue caused by pressure.  Do not lie or sit in one position for a long time.  Check your skin at least once a day for any changes in color and for any new blisters or sores.  Make sure to check under and around any medical devices and between skin folds. Have a caregiver do this for you if you are not able.  Eat a healthy diet that includes protein, vitamins, and minerals. Ask your health care provider what types of food you should eat. This information is not intended to replace advice given to you by your health care provider. Make sure you discuss any questions you have with your health care provider. Document Revised: 06/16/2018 Document Reviewed: 11/15/2017 Elsevier Patient Education  [place].

## 2019-05-02 LAB — BMP8+ANION GAP
Anion Gap: 15 mmol/L (ref 10.0–18.0)
BUN/Creatinine Ratio: 16 (ref 10–24)
BUN: 44 mg/dL — ABNORMAL HIGH (ref 8–27)
CO2: 19 mmol/L — ABNORMAL LOW (ref 20–29)
Calcium: 8.5 mg/dL — ABNORMAL LOW (ref 8.6–10.2)
Chloride: 106 mmol/L (ref 96–106)
Creatinine, Ser: 2.75 mg/dL — ABNORMAL HIGH (ref 0.76–1.27)
GFR calc Af Amer: 26 mL/min/{1.73_m2} — ABNORMAL LOW (ref >59)
GFR calc non Af Amer: 23 mL/min/{1.73_m2} — ABNORMAL LOW (ref >59)
Glucose: 85 mg/dL (ref 65–99)
Potassium: 4.6 mmol/L (ref 3.5–5.2)
Sodium: 140 mmol/L (ref 134–144)

## 2019-05-03 ENCOUNTER — Other Ambulatory Visit: Payer: Self-pay | Admitting: Internal Medicine

## 2019-05-03 ENCOUNTER — Encounter: Payer: Self-pay | Admitting: Internal Medicine

## 2019-05-03 DIAGNOSIS — Z89512 Acquired absence of left leg below knee: Secondary | ICD-10-CM

## 2019-05-03 DIAGNOSIS — Z89511 Acquired absence of right leg below knee: Secondary | ICD-10-CM

## 2019-05-03 NOTE — Assessment & Plan Note (Addendum)
Lab Results  Component Value Date   CREATININE 2.75 (H) 05/01/2019   Continues to have stable creatinine. Baseline around 2.7. Currently on furosemide. Would benefit from titrating off loop diuretic eventually but had hospitalization couple months ago for exacerbation. Will get bmp today to monitor renal fx. At discharge from prior hospitalization, was recommended to refer to nephrology but patient resistant to seeing additional specialist. Will check bmp today and continue to recommend f/u with nephrology  - BMP

## 2019-05-03 NOTE — Progress Notes (Signed)
Received message requesting order for stabilizer sleeve and prosthesis socks. DME order sent.

## 2019-05-03 NOTE — Assessment & Plan Note (Addendum)
Appear to be healing well on exam. No palpable effusion. No surrounding edema, erythema or tenderness. No fevers, chills, nausea, vomiting, malaise  - C/w keeping wound site clean

## 2019-05-03 NOTE — Assessment & Plan Note (Addendum)
BP Readings from Last 3 Encounters:  05/01/19 132/72  04/24/19 140/77  04/17/19 133/80   Above goal at this visit. On furosemide, Imdur, Hydralazine. Previously on lisinopril but discontinued at prior hospitalization due to AKI. Will need to restart as he has systolic heart failure.  - C/w current regimen - Will need to restart ACE or ARB, will check bmp today prior to initiation  Addendum: Potassium on high-normal at 4.6. At risk for developing hyperkalemia due to CKD4 with initiation of Ace-inhibitor. Will monitor for now.

## 2019-05-03 NOTE — Assessment & Plan Note (Signed)
Wt Readings from Last 3 Encounters:  05/01/19 182 lb 12.8 oz (82.9 kg)  04/24/19 180 lb (81.6 kg)  04/17/19 180 lb 6.4 oz (81.8 kg)   [name] presents for management of his chronic conditions. Currently on furosemide 80mg  BID. Tolerating it well. Is complaining of frequent urination. Unable to afford Bidil so currently on Imdur and hydralazine. Discussed benefits of Entresto but states will not be afford medications with high-copay. Discussed restarting his lisinopril but states he's 'trying to get off all these meds' and mentions he would not like to start these new medications at this time. Euvolemic on exam. Weight 2 pounds over last known dry weight. Denies any chest pain, palpitations, dyspnea. Discussed continuing current management and following up with cardiology.  - C/w hydralazine, isosorbide mononitrate, furosemide 80mg  BID - BMP today

## 2019-05-07 NOTE — Progress Notes (Signed)
Internal Medicine Clinic Attending ° °Case discussed with Dr. Lee at the time of the visit.  We reviewed the resident’s history and exam and pertinent patient test results.  I agree with the assessment, diagnosis, and plan of care documented in the resident’s note.  °

## 2019-06-22 DIAGNOSIS — Z89512 Acquired absence of left leg below knee: Secondary | ICD-10-CM | POA: Diagnosis not present

## 2019-06-22 DIAGNOSIS — Z89511 Acquired absence of right leg below knee: Secondary | ICD-10-CM | POA: Diagnosis not present

## 2019-07-31 ENCOUNTER — Ambulatory Visit (INDEPENDENT_AMBULATORY_CARE_PROVIDER_SITE_OTHER): Payer: Medicare HMO | Admitting: Internal Medicine

## 2019-07-31 ENCOUNTER — Encounter: Payer: Self-pay | Admitting: Internal Medicine

## 2019-07-31 VITALS — BP 112/72 | HR 83 | Temp 98.1°F | Ht 69.0 in | Wt 191.2 lb

## 2019-07-31 DIAGNOSIS — E1165 Type 2 diabetes mellitus with hyperglycemia: Secondary | ICD-10-CM | POA: Diagnosis not present

## 2019-07-31 DIAGNOSIS — N184 Chronic kidney disease, stage 4 (severe): Secondary | ICD-10-CM | POA: Diagnosis not present

## 2019-07-31 DIAGNOSIS — I5022 Chronic systolic (congestive) heart failure: Secondary | ICD-10-CM

## 2019-07-31 DIAGNOSIS — I5032 Chronic diastolic (congestive) heart failure: Secondary | ICD-10-CM

## 2019-07-31 DIAGNOSIS — E118 Type 2 diabetes mellitus with unspecified complications: Secondary | ICD-10-CM | POA: Diagnosis not present

## 2019-07-31 DIAGNOSIS — IMO0002 Reserved for concepts with insufficient information to code with codable children: Secondary | ICD-10-CM

## 2019-07-31 DIAGNOSIS — E1122 Type 2 diabetes mellitus with diabetic chronic kidney disease: Secondary | ICD-10-CM | POA: Diagnosis not present

## 2019-07-31 DIAGNOSIS — I130 Hypertensive heart and chronic kidney disease with heart failure and stage 1 through stage 4 chronic kidney disease, or unspecified chronic kidney disease: Secondary | ICD-10-CM | POA: Diagnosis not present

## 2019-07-31 DIAGNOSIS — I10 Essential (primary) hypertension: Secondary | ICD-10-CM

## 2019-07-31 LAB — POCT GLYCOSYLATED HEMOGLOBIN (HGB A1C): Hemoglobin A1C: 5.2 % (ref 4.0–5.6)

## 2019-07-31 LAB — GLUCOSE, CAPILLARY: Glucose-Capillary: 198 mg/dL — ABNORMAL HIGH (ref 70–99)

## 2019-07-31 NOTE — Assessment & Plan Note (Signed)
Presents for continued management of CKD4. Currently at baseline. Beginning to retain fluid with increasing weight. Concerning for worsening edema. Had prolonged discussion with [name] regarding starting ACei/Arb for renal-protective effect. Also discussed possibility of worsening renal function and eventual progression to dialysis. [name] states he would 'never want to go through dialysis' after observing his mother go through it. He is resistant to starting new medication but he is agreeable at this time to see a nephrologist.  - Bmp - Referral to nephrology

## 2019-07-31 NOTE — Assessment & Plan Note (Signed)
BP Readings from Last 3 Encounters:  07/31/19 112/72  05/01/19 132/72  04/24/19 140/77   BP at goal this visit. On furosemide, Imdur, Hydralazine. Currently not on acei/Arb due to patient preference.  - C/w Imdur, Hydralazine - BMP

## 2019-07-31 NOTE — Assessment & Plan Note (Signed)
Lab Results  Component Value Date   HGBA1C 5.2 07/31/2019   Presents for f/u management of diabetes. Previously had hgb a1c as high as 10.9. However repeat hgb a1cs have been below 6. Currently not on treatment. Denies any polyuria, polydipsia or polyphagia. Likely resolved now.  - Monitor

## 2019-07-31 NOTE — Assessment & Plan Note (Addendum)
Wt Readings from Last 3 Encounters:  07/31/19 191 lb 3.2 oz (86.7 kg)  05/01/19 182 lb 12.8 oz (82.9 kg)  04/24/19 180 lb (81.6 kg)   Mr.Sine is a 69 yo M w/ PMH of systolic heart failure, HTN, CKD4, MDD, Ischemic cardiomyopathy s/p CABG presenting to [facility] for management of his chronic conditions. He states that since his last visit, he has self-titrated down his furosemide from 80 mg BID to 80mg  daily. He mentions that he felt 'he was losing too much water' as well as fear of muscle cramps and down-titrated his diuretic. He denies any significant lower extremity swelling, dyspnea, orthopnea, chest pain or palpitations. He states that he does take an extra furosemide as needed if he feels he is beginning to have worsening swelling.  A/P Presents for management of his HFrEF. Increasing weight concerning for impending acute heart failure. Currently not in respiratory distress and no evidence of volume overload on exam. Management complicated by patient non-adherence. Also needs Ace/Arb for mortality benefit but unable to afford co-pay and resistant to starting new medications. Discussed in detail red-flag symptoms and advised to return if developing worsening chest, pain, palpitations or dyspnea  - C/w hydralazine, isosorbide mononitrate - Furosemide 80mg  1-2 times daily per patient preference

## 2019-07-31 NOTE — Patient Instructions (Signed)
Dear [name],  Thank you for allowing [place] to provide your care today. Today we discussed your kidney function    I have ordered bmp labs for you. I will call if any are abnormal.    Today we made no changes to your medications:    Please follow-up in 3 months.    Should you have any questions or concerns please call the internal medicine clinic at [phone].    Thank you for choosing [facility].   Chronic Kidney Disease, Adult Chronic kidney disease (CKD) happens when the kidneys are damaged over a long period of time. The kidneys are two organs that help with:  Getting rid of waste and extra fluid from the blood.  Making hormones that maintain the amount of fluid in your tissues and blood vessels.  Making sure that the body has the right amount of fluids and chemicals. Most of the time, CKD does not go away, but it can usually be controlled. Steps must be taken to slow down the kidney damage or to stop it from getting worse. If this is not done, the kidneys may stop working. Follow these instructions at home: Medicines  Take over-the-counter and prescription medicines only as told by your doctor. You may need to change the amount of medicines you take.  Do not take any new medicines unless your doctor says it is okay. Many medicines can make your kidney damage worse.  Do not take any vitamin and supplements unless your doctor says it is okay. Many vitamins and supplements can make your kidney damage worse. General instructions  Follow a diet as told by your doctor. You may need to stay away from: ? Alcohol. ? Salty foods. ? Foods that are high in:  Potassium.  Calcium.  Protein.  Do not use any products that contain nicotine or tobacco, such as cigarettes and e-cigarettes. If you need help quitting, ask your doctor.  Keep track of your blood pressure at home. Tell your doctor about any changes.  If you have diabetes, keep track of your blood sugar as  told by your doctor.  Try to stay at a healthy weight. If you need help, ask your doctor.  Exercise at least 30 minutes a day, 5 days a week.  Stay up-to-date with your shots (immunizations) as told by your doctor.  Keep all follow-up visits as told by your doctor. This is important. Contact a doctor if:  Your symptoms get worse.  You have new symptoms. Get help right away if:  You have symptoms of end-stage kidney disease. These may include: ? Headaches. ? Numbness in your hands or feet. ? Easy bruising. ? Having hiccups often. ? Chest pain. ? Shortness of breath. ? Stopping of menstrual periods in women.  You have a fever.  You have very little pee (urine).  You have pain or bleeding when you pee. Summary  Chronic kidney disease (CKD) happens when the kidneys are damaged over a long period of time.  Most of the time, this condition does not go away, but it can usually be controlled. Steps must be taken to slow down the kidney damage or to stop it from getting worse.  Treatment may include a combination of medicines and lifestyle changes. This information is not intended to replace advice given to you by your health care provider. Make sure you discuss any questions you have with your health care provider. Document Revised: 02/04/2017 Document Reviewed: 03/29/2016 Elsevier Patient Education  2020 [employer]  Inc.  

## 2019-07-31 NOTE — Progress Notes (Signed)
CC: Chronic Kidney Disease  HPI: [name] is a 69 y.o. with PMH listed below presenting with complaint of CKD. Please see problem based assessment and plan for further details.  Past Medical History:  Diagnosis Date  . Exertional shortness of breath    "before my last OR; I'm fine now" (09/26/2012)  . GERD (gastroesophageal reflux disease)   . High cholesterol   . Hx of seasonal allergies   . Hypertension   . Myocardial infarction ([place])   . Scrotal edema 01/30/2019  . Stroke [facility]) 2009   memory loss  . Type II diabetes mellitus ([place])     Review of Systems: Review of Systems  Constitutional: Negative for chills, fever and malaise/fatigue.  Eyes: Negative for blurred vision.  Respiratory: Negative for shortness of breath.   Cardiovascular: Negative for chest pain, palpitations and leg swelling.  Gastrointestinal: Negative for constipation, diarrhea, nausea and vomiting.  Genitourinary: Negative for dysuria and urgency.  All other systems reviewed and are negative.    Physical Exam: Vitals:   07/31/19 1403 07/31/19 1417  BP: 139/60 112/72  Pulse: 83   Temp: 98.1 F (36.7 C)   TempSrc: Oral   SpO2: 100%   Weight: 191 lb 3.2 oz (86.7 kg)   Height: 5\' 9"  (1.753 m)     Physical Exam  Constitutional: He appears well-developed and well-nourished. No distress.  HENT:  Mouth/Throat: Oropharynx is clear and moist.  Eyes: Conjunctivae are normal.  Neck: No JVD present.  Cardiovascular: Normal rate, regular rhythm and intact distal pulses.  Murmur (holosystolic murmur at RSB) heard. Respiratory: Effort normal and breath sounds normal. No respiratory distress. He has no wheezes. He has no rales.  GI: Soft. Bowel sounds are normal. He exhibits no distension. There is no abdominal tenderness.  No abdominal wall edema  Musculoskeletal:        General: Deformity (bilateral BKA) present. Normal range of motion.     Cervical back: Normal range of motion and neck  supple.  Skin: Skin is warm and dry.  Unable to visualize prior wound due to patient refusal     Assessment & Plan:   Hypertension BP Readings from Last 3 Encounters:  07/31/19 112/72  05/01/19 132/72  04/24/19 140/77   BP at goal this visit. On furosemide, Imdur, Hydralazine. Currently not on acei/Arb due to patient preference.  - C/w Imdur, Hydralazine - BMP  Chronic systolic heart failure (HCC) Wt Readings from Last 3 Encounters:  07/31/19 191 lb 3.2 oz (86.7 kg)  05/01/19 182 lb 12.8 oz (82.9 kg)  04/24/19 180 lb (81.6 kg)   [name] is a 69 yo M w/ PMH of systolic heart failure, HTN, CKD4, MDD, Ischemic cardiomyopathy s/p CABG presenting to [facility] for management of his chronic conditions. He states that since his last visit, he has self-titrated down his furosemide from 80 mg BID to 80mg  daily. He mentions that he felt 'he was losing too much water' as well as fear of muscle cramps and down-titrated his diuretic. He denies any significant lower extremity swelling, dyspnea, orthopnea, chest pain or palpitations. He states that he does take an extra furosemide as needed if he feels he is beginning to have worsening swelling.  A/P Presents for management of his HFrEF. Increasing weight concerning for impending acute heart failure. Currently not in respiratory distress and no evidence of volume overload on exam. Management complicated by patient non-adherence. Also needs Ace/Arb for mortality benefit but unable to afford co-pay and resistant  to starting new medications. Discussed in detail red-flag symptoms and advised to return if developing worsening chest, pain, palpitations or dyspnea  - C/w hydralazine, isosorbide mononitrate - Furosemide 80mg  1-2 times daily per patient preference  CKD (chronic kidney disease), stage IV ([place]) Presents for continued management of CKD4. Currently at baseline. Beginning to retain fluid with increasing weight. Concerning for worsening edema. Had  prolonged discussion with Mr.Lodico regarding starting ACei/Arb for renal-protective effect. Also discussed possibility of worsening renal function and eventual progression to dialysis. Mr.Allinson states he would 'never want to go through dialysis' after observing his mother go through it. He is resistant to starting new medication but he is agreeable at this time to see a nephrologist.  - Bmp - Referral to nephrology  Diabetes mellitus type 2, uncontrolled, with complications [facility]) Lab Results  Component Value Date   HGBA1C 5.2 07/31/2019   Presents for f/u management of diabetes. Previously had hgb a1c as high as 10.9. However repeat hgb a1cs have been below 6. Currently not on treatment. Denies any polyuria, polydipsia or polyphagia. Likely resolved now.  - Monitor    Patient discussed with Dr. Rebeca Alert   -[name], PGY2 [facility] Pager: [phone]

## 2019-08-01 LAB — BMP8+ANION GAP
Anion Gap: 13 mmol/L (ref 10.0–18.0)
BUN/Creatinine Ratio: 12 (ref 10–24)
BUN: 35 mg/dL — ABNORMAL HIGH (ref 8–27)
CO2: 19 mmol/L — ABNORMAL LOW (ref 20–29)
Calcium: 8.6 mg/dL (ref 8.6–10.2)
Chloride: 103 mmol/L (ref 96–106)
Creatinine, Ser: 2.99 mg/dL — ABNORMAL HIGH (ref 0.76–1.27)
GFR calc Af Amer: 24 mL/min/{1.73_m2} — ABNORMAL LOW (ref >59)
GFR calc non Af Amer: 20 mL/min/{1.73_m2} — ABNORMAL LOW (ref >59)
Glucose: 233 mg/dL — ABNORMAL HIGH (ref 65–99)
Potassium: 4.6 mmol/L (ref 3.5–5.2)
Sodium: 135 mmol/L (ref 134–144)

## 2019-08-01 NOTE — Progress Notes (Signed)
Internal Medicine Clinic Attending  Case discussed with Dr. Lee at the time of the visit.  We reviewed the resident's history and exam and pertinent patient test results.  I agree with the assessment, diagnosis, and plan of care documented in the resident's note.  Nakul Avino, M.D., Ph.D.  

## 2019-10-05 NOTE — Discharge Instructions (Signed)

## 2019-10-08 ENCOUNTER — Encounter (HOSPITAL_COMMUNITY)
Admission: RE | Admit: 2019-10-08 | Discharge: 2019-10-08 | Disposition: A | Discharge status: Home / Self Care | Payer: Medicare Other | Source: Ambulatory Visit | Attending: Nephrology | Admitting: Nephrology

## 2019-10-08 ENCOUNTER — Other Ambulatory Visit: Payer: Self-pay

## 2019-10-08 VITALS — BP 132/65 | HR 81 | Temp 98.7°F | Resp 18

## 2019-10-08 DIAGNOSIS — N184 Chronic kidney disease, stage 4 (severe): Secondary | ICD-10-CM | POA: Diagnosis present

## 2019-10-08 LAB — POCT HEMOGLOBIN-HEMACUE: Hemoglobin: 11.1 g/dL — ABNORMAL LOW (ref 13.0–17.0)

## 2019-10-08 MED ORDER — EPOETIN ALFA-EPBX 10000 UNIT/ML IJ SOLN: 5000.0000 [IU] | INTRAMUSCULAR | Status: DC

## 2019-10-08 MED ORDER — EPOETIN ALFA-EPBX 2000 UNIT/ML IJ SOLN
INTRAMUSCULAR | Status: AC
  Administered 2019-10-08: 2000 [IU] via SUBCUTANEOUS
  Filled 2019-10-08: qty 1

## 2019-10-08 MED ORDER — EPOETIN ALFA-EPBX 3000 UNIT/ML IJ SOLN
INTRAMUSCULAR | Status: AC
  Administered 2019-10-08: 3000 [IU] via SUBCUTANEOUS
  Filled 2019-10-08: qty 1

## 2019-10-12 ENCOUNTER — Encounter: Payer: Self-pay | Admitting: Internal Medicine

## 2019-10-12 ENCOUNTER — Other Ambulatory Visit: Payer: Self-pay

## 2019-10-12 ENCOUNTER — Ambulatory Visit (INDEPENDENT_AMBULATORY_CARE_PROVIDER_SITE_OTHER): Payer: Medicare Other | Admitting: Internal Medicine

## 2019-10-12 ENCOUNTER — Telehealth: Payer: Self-pay | Admitting: Internal Medicine

## 2019-10-12 VITALS — BP 140/74 | HR 93 | Temp 98.5°F | Ht 69.0 in | Wt 196.8 lb

## 2019-10-12 DIAGNOSIS — I129 Hypertensive chronic kidney disease with stage 1 through stage 4 chronic kidney disease, or unspecified chronic kidney disease: Secondary | ICD-10-CM | POA: Diagnosis not present

## 2019-10-12 DIAGNOSIS — R252 Cramp and spasm: Secondary | ICD-10-CM | POA: Diagnosis not present

## 2019-10-12 DIAGNOSIS — N184 Chronic kidney disease, stage 4 (severe): Secondary | ICD-10-CM | POA: Diagnosis not present

## 2019-10-12 LAB — BASIC METABOLIC PANEL
Anion gap: 10 (ref 5–15)
BUN: 72 mg/dL — ABNORMAL HIGH (ref 8–23)
CO2: 26 mmol/L (ref 22–32)
Calcium: 8.5 mg/dL — ABNORMAL LOW (ref 8.9–10.3)
Chloride: 97 mmol/L — ABNORMAL LOW (ref 98–111)
Creatinine, Ser: 3.52 mg/dL — ABNORMAL HIGH (ref 0.61–1.24)
GFR calc Af Amer: 19 mL/min — ABNORMAL LOW (ref >60)
GFR calc non Af Amer: 17 mL/min — ABNORMAL LOW (ref >60)
Glucose, Bld: 178 mg/dL — ABNORMAL HIGH (ref 70–99)
Potassium: 4 mmol/L (ref 3.5–5.1)
Sodium: 133 mmol/L — ABNORMAL LOW (ref 135–145)

## 2019-10-12 LAB — PHOSPHORUS: Phosphorus: 3.8 mg/dL (ref 2.5–4.6)

## 2019-10-12 LAB — MAGNESIUM: Magnesium: 2.5 mg/dL — ABNORMAL HIGH (ref 1.7–2.4)

## 2019-10-12 NOTE — Patient Instructions (Addendum)
Thank you for trusting me with your care. To recap, today we discussed the following:   1. Muscle cramps Check - Magnesium - Phosphorus - BMP w Anion Gap (STAT/Sunquest-performed on-site)  I will call you with results of your labs and instruction on taking potassium supplementations if needed.  My best,  Charlyne Mom

## 2019-10-12 NOTE — Assessment & Plan Note (Signed)
HPI: Has a history of nausea and vomiting occassionally. Reports recently stopping his potassium supplement under instruction of his PCP. On review of his labs his potassium was higher end of normal on last two checks. No currently having any cramps. Plan: Check BMP, Mg , Phos  Addendum , potassium normal. Mg 2.5, Phos nl. Na 133 , Kidney function slightly worse than it was 2 months ago.- Patient will not start potassium supplementation and reports he has follow up within the month with his nephrologist. Given instructions to call clinic if he is not urinating regularly or continues to have cramps .

## 2019-10-12 NOTE — Telephone Encounter (Signed)
Pls contact pt [phone] patient is cramping badly he needs some medicine pls contact him

## 2019-10-12 NOTE — Assessment & Plan Note (Signed)
HPI: Has a history of nausea and vomiting occassionally. Reports recently stopping his potassium supplement under instruction of his PCP. On review of his labs his potassium was higher end of normal on last two checks. No currently having any cramps. Plan: Check BMP, Mg , Phos

## 2019-10-12 NOTE — Telephone Encounter (Signed)
Patient called in stating he vomited this AM and an hour later began having cramping in legs and abdomen. States he was told to stop taking potassium about a month ago and thinks this is related. Appt given today at 3:45 with [employer]. Hubbard Hartshorn, BSN, RN-BC

## 2019-10-12 NOTE — Progress Notes (Signed)
   CC: leg cramps  HPI:[name] is a 69 y.o. male who presents for evaluation of leg cramps. Please see individual problem based A/P for details.   Past Medical History:  Diagnosis Date  . Exertional shortness of breath    "before my last OR; I'm fine now" (09/26/2012)  . GERD (gastroesophageal reflux disease)   . High cholesterol   . Hx of seasonal allergies   . Hypertension   . Myocardial infarction ([place])   . Scrotal edema 01/30/2019  . Stroke [facility]) 2009   memory loss  . Type II diabetes mellitus ([place])    Review of Systems:   ROS negative except as mentioned in individual problem based A/P.   Physical Exam: Vitals:   10/12/19 1443  BP: 140/74  Pulse: 93  Temp: 98.5 F (36.9 C)  TempSrc: Oral  SpO2: 100%  Weight: 196 lb 12.8 oz (89.3 kg)  Height: 5\' 9"  (1.753 m)    General: NAD, nl appearance Cardiovascular: Normal rate, regular rhythm.  No murmurs, rubs, or gallops Pulmonary : Effort normal, breath sounds normal. No wheezes, rales, or rhonchi MSK: bilateral bka, no muscle tenderness on palpation,    Assessment & Plan:   See Encounters Tab for problem based charting.  Patient discussed with Dr. Philipp Ovens

## 2019-10-15 NOTE — Progress Notes (Signed)
Internal Medicine Clinic Attending  Case discussed with Dr. Steen  At the time of the visit.  We reviewed the resident's history and exam and pertinent patient test results.  I agree with the assessment, diagnosis, and plan of care documented in the resident's note.  

## 2019-11-05 ENCOUNTER — Other Ambulatory Visit: Payer: Self-pay

## 2019-11-05 ENCOUNTER — Encounter (HOSPITAL_COMMUNITY)
Admission: RE | Admit: 2019-11-05 | Discharge: 2019-11-05 | Disposition: A | Discharge status: Home / Self Care | Payer: Medicare Other | Source: Ambulatory Visit | Attending: Nephrology | Admitting: Nephrology

## 2019-11-05 VITALS — BP 137/64 | HR 85 | Temp 98.0°F | Resp 18

## 2019-11-05 DIAGNOSIS — N184 Chronic kidney disease, stage 4 (severe): Secondary | ICD-10-CM | POA: Diagnosis not present

## 2019-11-05 LAB — IRON AND TIBC
Iron: 102 ug/dL (ref 45–182)
Saturation Ratios: 36 % (ref 17.9–39.5)
TIBC: 281 ug/dL (ref 250–450)
UIBC: 179 ug/dL

## 2019-11-05 LAB — FERRITIN: Ferritin: 837 ng/mL — ABNORMAL HIGH (ref 24–336)

## 2019-11-05 MED ORDER — EPOETIN ALFA-EPBX 10000 UNIT/ML IJ SOLN: 5000.0000 [IU] | INTRAMUSCULAR | Status: DC

## 2019-11-05 MED ORDER — EPOETIN ALFA-EPBX 2000 UNIT/ML IJ SOLN
INTRAMUSCULAR | Status: AC
  Administered 2019-11-05: 2000 [IU]
  Filled 2019-11-05: qty 1

## 2019-11-05 MED ORDER — EPOETIN ALFA-EPBX 3000 UNIT/ML IJ SOLN
INTRAMUSCULAR | Status: AC
  Administered 2019-11-05: 3000 [IU]
  Filled 2019-11-05: qty 1

## 2019-11-06 LAB — POCT HEMOGLOBIN-HEMACUE: Hemoglobin: 10.6 g/dL — ABNORMAL LOW (ref 13.0–17.0)

## 2019-12-03 ENCOUNTER — Ambulatory Visit (HOSPITAL_COMMUNITY)
Admission: RE | Admit: 2019-12-03 | Discharge: 2019-12-03 | Disposition: A | Discharge status: Home / Self Care | Payer: Medicare Other | Source: Ambulatory Visit | Attending: Nephrology | Admitting: Nephrology

## 2019-12-03 ENCOUNTER — Other Ambulatory Visit: Payer: Self-pay

## 2019-12-03 VITALS — BP 128/62 | HR 73 | Temp 98.2°F | Resp 18

## 2019-12-03 DIAGNOSIS — N184 Chronic kidney disease, stage 4 (severe): Secondary | ICD-10-CM | POA: Diagnosis not present

## 2019-12-03 LAB — IRON AND TIBC
Iron: 97 ug/dL (ref 45–182)
Saturation Ratios: 35 % (ref 17.9–39.5)
TIBC: 277 ug/dL (ref 250–450)
UIBC: 180 ug/dL

## 2019-12-03 LAB — FERRITIN: Ferritin: 1008 ng/mL — ABNORMAL HIGH (ref 24–336)

## 2019-12-03 LAB — POCT HEMOGLOBIN-HEMACUE: Hemoglobin: 11.2 g/dL — ABNORMAL LOW (ref 13.0–17.0)

## 2019-12-03 MED ORDER — EPOETIN ALFA-EPBX 2000 UNIT/ML IJ SOLN
INTRAMUSCULAR | Status: AC
  Administered 2019-12-03: 2000 [IU]
  Filled 2019-12-03: qty 1

## 2019-12-03 MED ORDER — EPOETIN ALFA-EPBX 3000 UNIT/ML IJ SOLN
INTRAMUSCULAR | Status: AC
  Administered 2019-12-03: 3000 [IU]
  Filled 2019-12-03: qty 1

## 2019-12-03 MED ORDER — EPOETIN ALFA-EPBX 10000 UNIT/ML IJ SOLN: 5000.0000 [IU] | INTRAMUSCULAR | Status: DC

## 2019-12-25 ENCOUNTER — Other Ambulatory Visit: Payer: Self-pay

## 2019-12-25 ENCOUNTER — Emergency Department (HOSPITAL_COMMUNITY)
Admission: EM | Admit: 2019-12-25 | Discharge: 2019-12-25 | Disposition: A | Discharge status: Home / Self Care | Payer: Medicare Other | Attending: Emergency Medicine | Admitting: Emergency Medicine

## 2019-12-25 ENCOUNTER — Encounter (HOSPITAL_COMMUNITY): Payer: Self-pay | Admitting: Emergency Medicine

## 2019-12-25 DIAGNOSIS — Z87891 Personal history of nicotine dependence: Secondary | ICD-10-CM | POA: Insufficient documentation

## 2019-12-25 DIAGNOSIS — I5022 Chronic systolic (congestive) heart failure: Secondary | ICD-10-CM | POA: Diagnosis not present

## 2019-12-25 DIAGNOSIS — E119 Type 2 diabetes mellitus without complications: Secondary | ICD-10-CM | POA: Diagnosis not present

## 2019-12-25 DIAGNOSIS — I130 Hypertensive heart and chronic kidney disease with heart failure and stage 1 through stage 4 chronic kidney disease, or unspecified chronic kidney disease: Secondary | ICD-10-CM | POA: Diagnosis not present

## 2019-12-25 DIAGNOSIS — Z7982 Long term (current) use of aspirin: Secondary | ICD-10-CM | POA: Insufficient documentation

## 2019-12-25 DIAGNOSIS — Z79899 Other long term (current) drug therapy: Secondary | ICD-10-CM | POA: Diagnosis not present

## 2019-12-25 DIAGNOSIS — Z951 Presence of aortocoronary bypass graft: Secondary | ICD-10-CM | POA: Insufficient documentation

## 2019-12-25 DIAGNOSIS — N184 Chronic kidney disease, stage 4 (severe): Secondary | ICD-10-CM | POA: Diagnosis not present

## 2019-12-25 DIAGNOSIS — R1030 Lower abdominal pain, unspecified: Secondary | ICD-10-CM | POA: Insufficient documentation

## 2019-12-25 DIAGNOSIS — K219 Gastro-esophageal reflux disease without esophagitis: Secondary | ICD-10-CM | POA: Diagnosis not present

## 2019-12-25 DIAGNOSIS — R1031 Right lower quadrant pain: Secondary | ICD-10-CM

## 2019-12-25 NOTE — ED Triage Notes (Signed)
Pt arrives to ED with complaints of a possible inguinal hernia that happened this morning. Pt states he was coughing this morning and then threw up due to phlegm in his mouth. After that he noticed a protruding sac and pain coming from his groin area.

## 2019-12-25 NOTE — ED Provider Notes (Signed)
[place] EMERGENCY DEPARTMENT Provider Note   CSN: [id number] Arrival date & time: 12/25/19  7353     History Chief Complaint  Patient presents with  . Inguinal Hernia    [name] is a 69 y.o. male.  HPI Patient was coughing today after which he noticed some discomfort in the right groin, and he felt a bulge there.  No prior history of hernia.  He denies nausea, vomiting, change in bowel or urinary habits or symptoms. There is been no fever, cough, weakness or dizziness. There are no other known modifying factors.  Past Medical History:  Diagnosis Date  . Exertional shortness of breath    "before my last OR; I'm fine now" (09/26/2012)  . GERD (gastroesophageal reflux disease)   . High cholesterol   . Hx of seasonal allergies   . Hypertension   . Myocardial infarction ([place])   . Scrotal edema 01/30/2019  . Stroke [facility]) 2009   memory loss  . Type II diabetes mellitus [facility])     Patient Active Problem List   Diagnosis Date Noted  . Leg cramps 10/12/2019  . Healthcare maintenance 01/29/2019  . CKD (chronic kidney disease), stage IV ([place]) 01/29/2019  . Chronic systolic heart failure ([place]) 10/24/2012  . Major depressive disorder, recurrent episode, moderate ([place]) 10/05/2012  . 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 H/O: CVA (cerebrovascular accident) 07/27/2012  . Hypertension 07/27/2012  . Normocytic anemia 07/27/2012    Past Surgical History:  Procedure Laterality Date  . AMPUTATION Right 07/29/2012   Procedure: AMPUTATION 3RD TOE;  Surgeon: Kerin Salen, MD;  Location: [facility];  Service: Orthopedics;  Laterality: Right;  right third toe amputation  . AMPUTATION Right 08/02/2012   Procedure: right transmetatarsal amputation;  Surgeon: Kerin Salen, MD;  Location: [place];  Service: Orthopedics;  Laterality: Right;  . AMPUTATION Right 09/06/2012   Procedure: AMPUTATION BELOW KNEE;  Surgeon: Kerin Salen, MD;  Location: [place];  Service: Orthopedics;   Laterality: Right;  . AMPUTATION Left 09/27/2012   Procedure: AMPUTATION BELOW KNEE;  Surgeon: Kerin Salen, MD;  Location: [place];  Service: Orthopedics;  Laterality: Left;  . CATARACT EXTRACTION W/ INTRAOCULAR LENS  IMPLANT, BILATERAL  ?2010  . 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 ESOPHAGOGASTRODUODENOSCOPY N/A 10/08/2012   Procedure: ESOPHAGOGASTRODUODENOSCOPY (EGD);  Surgeon: Winfield Cunas., MD;  Location[facility] ENDOSCOPY;  Service: Endoscopy;  Laterality: N/A;  . PERIPHERALLY INSERTED CENTRAL CATHETER INSERTION Right 09/2012   upper arm       History reviewed. No pertinent family history.  Social History   Tobacco Use  . Smoking status: Former Smoker    Packs/day: 0.12    Years: 20.00    Pack years: 2.40    Types: Cigarettes  . Smokeless tobacco: Never Used  Vaping Use  . Vaping Use: Never used  Substance Use Topics  . Alcohol use: No    Comment: last drink 2010  . Drug use: No    Home Medications Prior to Admission medications   Medication Sig Start Date End Date Taking? Authorizing Provider  aspirin EC 81 MG tablet Take 81 mg by mouth every morning.    [provider]  atorvastatin (LIPITOR) 40 MG tablet Take 1 tablet (40 mg total) by mouth every morning. 08/23/18   Mariel Aloe, MD  Cyanocobalamin (VITAMIN B-12 PO) Take 1 tablet by mouth every morning.    [provider]  furosemide (LASIX) 80 MG tablet Take 1 tablet (80 mg total) by mouth 2 (two) times daily. 04/17/19 07/16/19  Al Decant, MD  hydrALAZINE (APRESOLINE) 25 MG tablet Take 1 tablet (25 mg total) by mouth 3 (three) times daily. 04/17/19   [name], MD  isosorbide mononitrate (IMDUR) 30 MG 24 hr tablet Take 1 tablet (30 mg total) by mouth daily. 04/17/19   [name], MD  metoprolol succinate (TOPROL-XL) 50 MG 24 hr tablet Take 50 mg by mouth every morning. 05/31/18   [provider]  nitroGLYCERIN (NITROSTAT) 0.4 MG SL tablet Place 0.4 mg under the tongue every  5 (five) minutes as needed for chest pain.    [provider]  pantoprazole (PROTONIX) 40 MG tablet Take 1 tablet (40 mg total) by mouth daily at 6 (six) AM. 12/21/18   Charolette Forward, MD  tamsulosin (FLOMAX) 0.4 MG CAPS capsule Take 0.4 mg by mouth every morning. 06/11/18   [provider]    Allergies    Patient has no known allergies.  Review of Systems   Review of Systems  All other systems reviewed and are negative.   Physical Exam Updated Vital Signs BP (!) 148/110 (BP Location: Right Arm)   Pulse 85   Temp 98.8 F (37.1 C) (Oral)   Resp 18   Ht 5\' 9"  (1.753 m)   Wt 92.1 kg   SpO2 100%   BMI 29.98 kg/m   Physical Exam Vitals and nursing note reviewed.  Constitutional:      General: He is not in acute distress.    Appearance: He is well-developed. He is not ill-appearing, toxic-appearing or diaphoretic.  HENT:     Head: Normocephalic and atraumatic.     Right Ear: External ear normal.     Left Ear: External ear normal.  Eyes:     Conjunctiva/sclera: Conjunctivae normal.     Pupils: Pupils are equal, round, and reactive to light.  Neck:     Trachea: Phonation normal.  Cardiovascular:     Rate and Rhythm: Normal rate.  Pulmonary:     Effort: Pulmonary effort is normal.  Abdominal:     General: There is no distension.     Palpations: Abdomen is soft.     Tenderness: There is no abdominal tenderness.     Comments: He is mildly tender in the right groin region, paraspinal distinctly appreciable mass or localized tenderness in this area.  Genitourinary:    Comments: Normal penis, scrotum and scrotal contents.  No bulge in the scrotum.  No appreciable inguinal hernia, palpated through the scrotal region. Musculoskeletal:        General: Normal range of motion.     Cervical back: Normal range of motion and neck supple.  Skin:    General: Skin is warm and dry.  Neurological:     Mental Status: He is alert and oriented to person, place, and time.      Cranial Nerves: No cranial nerve deficit.     Sensory: No sensory deficit.     Motor: No abnormal muscle tone.     Coordination: Coordination normal.  Psychiatric:        Behavior: Behavior normal.        Thought Content: Thought content normal.        Judgment: Judgment normal.     ED Results / Procedures / Treatments   Labs (all labs ordered are listed, but only abnormal results are displayed) Labs Reviewed - No  data to display  EKG None  Radiology No results found.  Procedures Procedures (including critical care time)  Medications Ordered in ED Medications - No data to display  ED Course  I have reviewed the triage vital signs and the nursing notes.  Pertinent labs & imaging results that were available during my care of the patient were reviewed by me and considered in my medical decision making (see chart for details).    MDM Rules/Calculators/A&P                           Patient Vitals for the past 24 hrs:  BP Temp Temp src Pulse Resp SpO2 Height Weight  12/25/19 1744 (!) 148/110 -- -- 85 18 100 % -- --  12/25/19 1551 (!) 159/90 -- -- 79 18 100 % -- --  12/25/19 1351 140/81 -- -- 79 18 100 % -- --  12/25/19 1152 (!) 141/82 -- -- 83 18 100 % -- --  12/25/19 0953 (!) 163/72 98.8 F (37.1 C) Oral (!) 102 18 100 % 5\' 9"  (1.753 m) 92.1 kg    7:37 PM Reevaluation with update and discussion. After initial assessment and treatment, an updated evaluation reveals no change in clinical status, plan discussed with patient.  Questions were answered. Daleen Bo   Medical Decision Making:  This patient is presenting for evaluation of right groin pain, which does not require a range of treatment options, and is not a complaint that involves a high risk of morbidity and mortality. The differential diagnoses include muscle strain, hernia. I decided to review old records, and in summary elderly male, prior history of inguinal hernia presenting with a sensation of a bulge  in his right groin.  I did not require additional historical information from anyone.    Critical Interventions-clinical evaluation, discussion of findings with patient  After These Interventions, the Patient was reevaluated and was found stable for discharge.  Patient with likely muscle strain or anxiety, possible small hernia which was not clinically evident.  No indication for further ED intervention or evaluation.  He will be referred to general surgery for further management.  CRITICAL CARE-no Performed by: Daleen Bo  Nursing Notes Reviewed/ Care Coordinated Applicable Imaging Reviewed Interpretation of Laboratory Data incorporated into ED treatment  The patient appears reasonably screened and/or stabilized for discharge and I doubt any other medical condition or other [facility] requiring further screening, evaluation, or treatment in the ED at this time prior to discharge.  Plan: Home Medications-continue usual; Home Treatments-heat to affected area and Tylenol for pain; return here if the recommended treatment, does not improve the symptoms; Recommended follow up-general surgery follow-up as needed.     Final Clinical Impression(s) / ED Diagnoses Final diagnoses:  None    Rx / DC Orders ED Discharge Orders    None       Daleen Bo, MD 12/25/19 1939

## 2019-12-25 NOTE — Discharge Instructions (Signed)
The bulge you are feeling in the right groin after coughing could be a hernia or a muscle strain.  Hernias become problematic when they interfere with eating or having bowel movements.  Call the surgeon's office for an appointment to be seen about the pain and bulge.  For pain, use Tylenol.  Try using heating pad on the area to help the discomfort.

## 2019-12-31 ENCOUNTER — Encounter (HOSPITAL_COMMUNITY)
Admission: RE | Admit: 2019-12-31 | Discharge: 2019-12-31 | Disposition: A | Discharge status: Home / Self Care | Payer: Medicare Other | Source: Ambulatory Visit | Attending: Nephrology | Admitting: Nephrology

## 2019-12-31 VITALS — BP 133/76 | HR 71 | Temp 97.6°F | Resp 18

## 2019-12-31 DIAGNOSIS — N184 Chronic kidney disease, stage 4 (severe): Secondary | ICD-10-CM | POA: Insufficient documentation

## 2019-12-31 LAB — POCT HEMOGLOBIN-HEMACUE: Hemoglobin: 11.8 g/dL — ABNORMAL LOW (ref 13.0–17.0)

## 2019-12-31 LAB — IRON AND TIBC
Iron: 70 ug/dL (ref 45–182)
Saturation Ratios: 27 % (ref 17.9–39.5)
TIBC: 262 ug/dL (ref 250–450)
UIBC: 192 ug/dL

## 2019-12-31 LAB — FERRITIN: Ferritin: 842 ng/mL — ABNORMAL HIGH (ref 24–336)

## 2019-12-31 MED ORDER — EPOETIN ALFA-EPBX 2000 UNIT/ML IJ SOLN
INTRAMUSCULAR | Status: AC
  Administered 2019-12-31: 2000 [IU]
  Filled 2019-12-31: qty 1

## 2019-12-31 MED ORDER — EPOETIN ALFA-EPBX 10000 UNIT/ML IJ SOLN: 5000.0000 [IU] | INTRAMUSCULAR | Status: DC

## 2019-12-31 MED ORDER — EPOETIN ALFA-EPBX 3000 UNIT/ML IJ SOLN
INTRAMUSCULAR | Status: AC
  Administered 2019-12-31: 3000 [IU]
  Filled 2019-12-31: qty 1

## 2020-01-28 ENCOUNTER — Encounter (HOSPITAL_COMMUNITY): Payer: Medicare Other

## 2020-01-28 ENCOUNTER — Encounter (HOSPITAL_COMMUNITY)
Admission: RE | Admit: 2020-01-28 | Discharge: 2020-01-28 | Disposition: A | Discharge status: Home / Self Care | Payer: Medicare Other | Source: Ambulatory Visit | Attending: Nephrology | Admitting: Nephrology

## 2020-01-28 VITALS — BP 137/90 | HR 83 | Temp 98.2°F | Resp 18

## 2020-01-28 DIAGNOSIS — N184 Chronic kidney disease, stage 4 (severe): Secondary | ICD-10-CM

## 2020-01-28 LAB — IRON AND TIBC
Iron: 128 ug/dL (ref 45–182)
Saturation Ratios: 49 % — ABNORMAL HIGH (ref 17.9–39.5)
TIBC: 262 ug/dL (ref 250–450)
UIBC: 134 ug/dL

## 2020-01-28 LAB — POCT HEMOGLOBIN-HEMACUE: Hemoglobin: 11 g/dL — ABNORMAL LOW (ref 13.0–17.0)

## 2020-01-28 LAB — FERRITIN: Ferritin: 1142 ng/mL — ABNORMAL HIGH (ref 24–336)

## 2020-01-28 MED ORDER — EPOETIN ALFA-EPBX 3000 UNIT/ML IJ SOLN
INTRAMUSCULAR | Status: AC
  Administered 2020-01-28: 3000 [IU] via SUBCUTANEOUS
  Filled 2020-01-28: qty 1

## 2020-01-28 MED ORDER — EPOETIN ALFA-EPBX 2000 UNIT/ML IJ SOLN
INTRAMUSCULAR | Status: AC
  Administered 2020-01-28: 2000 [IU] via SUBCUTANEOUS
  Filled 2020-01-28: qty 1

## 2020-01-28 MED ORDER — EPOETIN ALFA-EPBX 10000 UNIT/ML IJ SOLN: 5000.0000 [IU] | INTRAMUSCULAR | Status: DC

## 2020-02-11 ENCOUNTER — Encounter (HOSPITAL_COMMUNITY): Payer: Medicare Other

## 2020-02-13 ENCOUNTER — Telehealth: Payer: Self-pay

## 2020-02-13 NOTE — Telephone Encounter (Signed)
Spoke with patient and scheduled an in-person Palliative Consult for 03/12/2020 @ 8:30AM  COVID screening was negative. No pets in home. Patient lives alone.  Consent obtained; updated Outlook/Netsmart/Team List and Epic.

## 2020-02-25 ENCOUNTER — Encounter (HOSPITAL_COMMUNITY): Payer: Medicare Other

## 2020-03-12 ENCOUNTER — Other Ambulatory Visit: Payer: Self-pay

## 2020-03-12 ENCOUNTER — Other Ambulatory Visit: Payer: Medicare Other | Admitting: Internal Medicine

## 2020-03-12 DIAGNOSIS — Z515 Encounter for palliative care: Secondary | ICD-10-CM

## 2020-03-12 DIAGNOSIS — N184 Chronic kidney disease, stage 4 (severe): Secondary | ICD-10-CM

## 2020-03-12 DIAGNOSIS — Z7189 Other specified counseling: Secondary | ICD-10-CM

## 2020-03-24 ENCOUNTER — Encounter (HOSPITAL_COMMUNITY): Payer: Medicare Other

## 2020-03-25 NOTE — Progress Notes (Signed)
[facility] Consult Note Telephone: [phone]  Fax: [phone]  PATIENT NAME: [name] DOB: 1951-02-23 MRN: [id number]  PRIMARY CARE PROVIDER:   [name], MD  REFERRING PROVIDER:  Dr. Gean Price  RESPONSIBLE PARTY:   Self     RECOMMENDATIONS and PLAN:  Palliative care encounter  Z55.1  1.  Advance care planning:  Explanation of palliative and hospice care.  Advanced directives reviewed and patient reports that he would like to continue all current healthcare.  He also would like to have CPR attempted in the event he has a cardiac arrest.  He is open to re-addressing advanced directives in the future as needed with changes of his health status. Encouraged him to discuss health status with son.  He enjoys spending time with his family and is looking forward to having his apartment remodeled. His only desire is to have a company assist with custodial services in his apartment.( community resources provided.) Palliative care will f/u with patient in aprox 2 months.   2.  Chronic Kidney disease [address] [facility]:  Pt. Does not desire or plan on having hemodialysis if it is ever recommended. He will continue current therapies and medications related to treating CKD.  Encouraged to continue adequate hydration and avoid nephrotoxic substances. Follow-up with nephrologist as planned.  I spent 45  minutes providing this consultation,  from 0900 to 0945. More than 50% of the time in this consultation was spent coordinating communication with patient.   HISTORY OF PRESENT ILLNESS:  [name] is a 70 y.o. year old male with multiple medical problems including CAD with MI, T2DM, CKD st 4 and bilat lower limb amputations. Renal function labs are as noted in Oct 2021 CREA/GFR 3.02/23 and in Dec. 2021  3.37/20. He remains highly functional and is independent of all ADLs, medication administration and meals.  He is able to drive himself as needed.   Palliative Care was asked to help address goals of care.   CODE STATUS: Full Code  PPS: 50% HOSPICE ELIGIBILITY/DIAGNOSIS: TBD  PAST MEDICAL HISTORY:  Past Medical History:  Diagnosis Date  . Exertional shortness of breath    "before my last OR; I'm fine now" (09/26/2012)  . GERD (gastroesophageal reflux disease)   . High cholesterol   . Hx of seasonal allergies   . Hypertension   . Myocardial infarction ([place])   . Scrotal edema 01/30/2019  . Stroke [facility]) 2009   memory loss  . Type II diabetes mellitus ([place])     SOCIAL HX: Lives alone in senior apartment Social History   Tobacco Use  . Smoking status: Former Smoker    Packs/day: 0.12    Years: 20.00    Pack years: 2.40    Types: Cigarettes  . Smokeless tobacco: Never Used  Substance Use Topics  . Alcohol use: No    Comment: last drink 2010    ALLERGIES: No Known Allergies   PERTINENT MEDICATIONS:  Outpatient Encounter Medications as of 03/12/2020  Medication Sig  . aspirin EC 81 MG tablet Take 81 mg by mouth every morning.  Marland Kitchen atorvastatin (LIPITOR) 40 MG tablet Take 1 tablet (40 mg total) by mouth every morning.  . Cyanocobalamin (VITAMIN B-12 PO) Take 1 tablet by mouth every morning.  . hydrALAZINE (APRESOLINE) 25 MG tablet Take 1 tablet (25 mg total) by mouth 3 (three) times daily.  . isosorbide mononitrate (IMDUR) 30 MG 24 hr tablet Take 1 tablet (30 mg  total) by mouth daily.  . metoprolol succinate (TOPROL-XL) 50 MG 24 hr tablet Take 50 mg by mouth every morning.  . nitroGLYCERIN (NITROSTAT) 0.4 MG SL tablet Place 0.4 mg under the tongue every 5 (five) minutes as needed for chest pain.  . pantoprazole (PROTONIX) 40 MG tablet Take 1 tablet (40 mg total) by mouth daily at 6 (six) AM.  . tamsulosin (FLOMAX) 0.4 MG CAPS capsule Take 0.4 mg by mouth every morning.   No facility-administered encounter medications on file as of 03/12/2020.    PHYSICAL EXAM:   General: NAD, well nourished male sitting on  sofa Cardiovascular: regular rate and rhythm Pulmonary: clear throughout Abdomen: soft, nontender, + bowel sounds Extremities: bilat lower extremity prostheses in use.  Steady gait Skin: exposed skin is intact Neurological: alert and oriented x 3.  Fluid speech  Thomas Lex, NP-C

## 2020-04-04 ENCOUNTER — Encounter (HOSPITAL_COMMUNITY): Payer: Medicare Other

## 2020-05-02 ENCOUNTER — Other Ambulatory Visit: Payer: Self-pay

## 2020-05-02 ENCOUNTER — Other Ambulatory Visit: Payer: Medicare Other | Admitting: Student

## 2020-05-02 DIAGNOSIS — Z515 Encounter for palliative care: Secondary | ICD-10-CM

## 2020-05-02 NOTE — Progress Notes (Signed)
[facility] Consult Note Telephone: [phone]  Fax: [phone]  PATIENT NAME: [place] [address] [phone] (home)  DOB: [birth year] MRN: [id number]  PRIMARY CARE PROVIDER:    [name], MD,  [address] [place] [address] [phone]  REFERRING PROVIDER:   [name], MD [address],  [place] [address] [phone]  RESPONSIBLE PARTY:   Extended Emergency Contact Information Primary Emergency Contact: Jovoni, [place] Phone: [phone] Relation: Sister Secondary Emergency Contact: Corlis [place] Phone: [phone] Work Phone: [phone] Mobile Phone: [phone] Relation: Sister  I met face to face with patient in his home.  ASSESSMENT AND RECOMMENDATIONS:   Advance Care Planning: Visit at the request of Dr. Truman Hayward for palliative consult. Visit consisted of building trust and discussions on Palliative care medicine as specialized medical care for people living with serious illness, aimed at facilitating improved quality of life through symptoms relief, assisting with advance care planning and establishing goals of care. Palliative care will continue to provide support to patient, family and the medical team.  Goal of care: To maintain current level of care.  Directives: MOST form filled out; attempt CPR, full scope of treatment, antibiotics, IV fluids as indicated, no feeding tube.   Symptom Management:   CKD stage 4-patient is adamant on not wanting to receive dialysis should the need arise. He is educated on nephrotoxic medications, managing blood pressure, diabetes, continue Lokelma as directed. Follow up with nephrology as scheduled.   Follow up Palliative Care Visit: Palliative care will continue to follow for complex decision making and symptom management. Return in 8 weeks or  prn.  Family /Caregiver/Community Supports: No community agencies present. Palliative Medicine will continue to provide support to patient.     I spent 40 minutes providing this consultation, from 9:00am to 9:40am. Time includes time spent with patient/family, chart review, provider coordination, and documentation. More than 50% of the time in this consultation was spent counseling and coordinating communication.   CHIEF COMPLAINT: Palliative Medicine follow up visit.   History obtained from review of EMR, discussion with primary team, patient. Records reviewed and summarized below.  HISTORY OF PRESENT ILLNESS:  [name] is a 70 y.o. year old male with multiple medical problems including CAD with MI, T2DM, CKD st 4 and bilateral lower limb amputations. Palliative Care was asked to follow this patient by consultation request of [name], MD to help address advance care planning and goals of care. This is a follow up  Visit.  [name] reports doing well. He is indpendent for adl's. Still drives occasionally. He denies any pain, chest pain, nausea, constipation. He reports a good appetite; weight is steady. He states he is still voiding "normal" amounts. Denies any sleep difficulty. No recent falls or injury. No recent hospitalizations. Patient states that he witnessed his mother die from dialysis and is adamant that he will not receive dialysis. Follow up on 05/23/20 with Nephrology.   CODE STATUS: Full Code  PPS: 70%  HOSPICE ELIGIBILITY/DIAGNOSIS: TBD  ROS   General: NAD EYES: denies vision changes ENMT: denies dysphagia Cardiovascular: denies chest pain Pulmonary: denies cough, denies increased SOB Abdomen: endorses good appetite, continence of bowel GU: denies dysuria, continence of urine MSK: bilateral BKA, no falls reported Skin: denies rashes or wounds Neurological: endorses weakness Psych: Endorses good mood Heme/lymph/immuno: denies bruises, abnormal  bleeding    Physical Exam: VS: pulse 76, resp 16, b/p 130/90, sats 98% Constitutional: NAD, well groomed General: frail appearing, adequately nourished EYES: anicteric sclera, lids intact, no discharge  ENMT: intact hearing,oral mucous membranes moist CV: RRR Pulmonary: LCTA, no increased work of breathing, no cough, room air Abdomen: Bowel sounds normoactive x 4 GU: deferred MSK: bilateral prosthetics; ambulatory with rollator walker Skin: warm and dry, no rashes or wounds on visible skin Neuro: Generalized weakness, alert and oriented x 4 Psych: pleasant, non anxious affect Hem/lymph/immuno: no widespread bruising   PAST MEDICAL HISTORY:  Past Medical History:  Diagnosis Date  . Exertional shortness of breath    "before my last OR; I'm fine now" (09/26/2012)  . GERD (gastroesophageal reflux disease)   . High cholesterol   . Hx of seasonal allergies   . Hypertension   . Myocardial infarction ([place])   . Scrotal edema 01/30/2019  . 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 Type II diabetes mellitus ([place])     SOCIAL HX:  Social History   Tobacco Use  . Smoking status: Former Smoker    Packs/day: 0.12    Years: 20.00    Pack years: 2.40    Types: Cigarettes  . Smokeless tobacco: Never Used  Substance Use Topics  . Alcohol use: No    Comment: last drink 2010   FAMILY HX: No family history on file.  ALLERGIES: No Known Allergies   PERTINENT MEDICATIONS:  Outpatient Encounter Medications as of 05/02/2020  Medication Sig  . aspirin EC 81 MG tablet Take 81 mg by mouth every morning.  Marland Kitchen atorvastatin (LIPITOR) 40 MG tablet Take 1 tablet (40 mg total) by mouth every morning.  . Cyanocobalamin (VITAMIN B-12 PO) Take 1 tablet by mouth every morning.  . furosemide (LASIX) 80 MG tablet Take 1 tablet (80 mg total) by mouth 2 (two) times daily.  . hydrALAZINE (APRESOLINE) 25 MG tablet Take 1 tablet (25 mg total) by mouth 3 (three) times daily.  . isosorbide mononitrate (IMDUR) 30 MG 24 hr tablet  Take 1 tablet (30 mg total) by mouth daily.  . metoprolol succinate (TOPROL-XL) 50 MG 24 hr tablet Take 50 mg by mouth every morning.  . nitroGLYCERIN (NITROSTAT) 0.4 MG SL tablet Place 0.4 mg under the tongue every 5 (five) minutes as needed for chest pain.  . pantoprazole (PROTONIX) 40 MG tablet Take 1 tablet (40 mg total) by mouth daily at 6 (six) AM.  . tamsulosin (FLOMAX) 0.4 MG CAPS capsule Take 0.4 mg by mouth every morning.   No facility-administered encounter medications on file as of 05/02/2020.     Thank you for the opportunity to participate in the care of Mr. Haque. The palliative care team will continue to follow. Please call our office at [phone] if we can be of additional assistance.  Ezekiel Slocumb, NP, AGPCNP-C

## 2020-05-12 ENCOUNTER — Encounter (HOSPITAL_COMMUNITY)
Admission: RE | Admit: 2020-05-12 | Discharge: 2020-05-12 | Disposition: A | Discharge status: Home / Self Care | Payer: Medicare Other | Source: Ambulatory Visit | Attending: Nephrology | Admitting: Nephrology

## 2020-05-12 VITALS — BP 135/82 | HR 87 | Temp 98.0°F

## 2020-05-12 DIAGNOSIS — N184 Chronic kidney disease, stage 4 (severe): Secondary | ICD-10-CM

## 2020-05-12 LAB — POCT HEMOGLOBIN-HEMACUE: Hemoglobin: 9.4 g/dL — ABNORMAL LOW (ref 13.0–17.0)

## 2020-05-12 MED ORDER — CLONIDINE HCL 0.1 MG PO TABS: 0.1000 mg | ORAL_TABLET | Freq: Once | ORAL | Status: DC | PRN

## 2020-05-12 MED ORDER — EPOETIN ALFA-EPBX 2000 UNIT/ML IJ SOLN
INTRAMUSCULAR | Status: AC
  Administered 2020-05-12: 2000 [IU] via SUBCUTANEOUS
  Filled 2020-05-12: qty 1

## 2020-05-12 MED ORDER — EPOETIN ALFA-EPBX 3000 UNIT/ML IJ SOLN
INTRAMUSCULAR | Status: AC
  Administered 2020-05-12: 3000 [IU] via SUBCUTANEOUS
  Filled 2020-05-12: qty 1

## 2020-05-12 MED ORDER — EPOETIN ALFA-EPBX 10000 UNIT/ML IJ SOLN: 5000.0000 [IU] | INTRAMUSCULAR | Status: DC

## 2020-05-30 ENCOUNTER — Encounter (HOSPITAL_COMMUNITY): Payer: Medicare Other

## 2020-06-09 ENCOUNTER — Ambulatory Visit (HOSPITAL_COMMUNITY)
Admission: RE | Admit: 2020-06-09 | Discharge: 2020-06-09 | Disposition: A | Discharge status: Home / Self Care | Payer: Medicare Other | Source: Ambulatory Visit | Attending: Nephrology | Admitting: Nephrology

## 2020-06-09 ENCOUNTER — Other Ambulatory Visit: Payer: Self-pay

## 2020-06-09 VITALS — BP 120/62 | HR 84 | Temp 97.5°F | Resp 18

## 2020-06-09 DIAGNOSIS — N184 Chronic kidney disease, stage 4 (severe): Secondary | ICD-10-CM | POA: Diagnosis present

## 2020-06-09 LAB — IRON AND TIBC
Iron: 38 ug/dL — ABNORMAL LOW (ref 45–182)
Saturation Ratios: 16 % — ABNORMAL LOW (ref 17.9–39.5)
TIBC: 231 ug/dL — ABNORMAL LOW (ref 250–450)
UIBC: 193 ug/dL

## 2020-06-09 LAB — POCT HEMOGLOBIN-HEMACUE: Hemoglobin: 8.5 g/dL — ABNORMAL LOW (ref 13.0–17.0)

## 2020-06-09 LAB — FERRITIN: Ferritin: 635 ng/mL — ABNORMAL HIGH (ref 24–336)

## 2020-06-09 MED ORDER — EPOETIN ALFA-EPBX 3000 UNIT/ML IJ SOLN
INTRAMUSCULAR | Status: AC
  Administered 2020-06-09: 3000 [IU] via SUBCUTANEOUS
  Filled 2020-06-09: qty 1

## 2020-06-09 MED ORDER — EPOETIN ALFA-EPBX 10000 UNIT/ML IJ SOLN: 5000.0000 [IU] | INTRAMUSCULAR | Status: DC

## 2020-06-09 MED ORDER — EPOETIN ALFA-EPBX 2000 UNIT/ML IJ SOLN
INTRAMUSCULAR | Status: AC
  Administered 2020-06-09: 2000 [IU] via SUBCUTANEOUS
  Filled 2020-06-09: qty 1

## 2020-06-19 ENCOUNTER — Telehealth: Payer: Self-pay

## 2020-06-23 ENCOUNTER — Telehealth: Payer: Self-pay

## 2020-06-23 NOTE — Telephone Encounter (Signed)
Phone call placed to patient to check in. Patient stated he would like to schedule visit with Palliative care. Visit scheduled.

## 2020-06-23 NOTE — Telephone Encounter (Signed)
Bidil 20-37.'5mg'$  was denied at [facility], pharmacist informed patient needs an appt for medication management follow up. SChaplin, RN,BSN

## 2020-06-23 NOTE — Telephone Encounter (Signed)
Spoke with the patient.  He has sch an appt with Dr. Truman Hayward for 06/26/2020 @ 8:45 am.

## 2020-06-23 NOTE — Telephone Encounter (Signed)
Sending refill request to MD to please clarify dosage. [facility] w/ last fill date noted 03/26/20 for  Bidil '20mg'$ -37.'5mg'$ .  LOV was 10/12/2019 SChaplin, RN,BSN

## 2020-06-23 NOTE — Telephone Encounter (Signed)
I had discontinued that medicine as he could not afford the co-pay and we have him on a different medication as substitute. Deny refill request please. Also forwarding to front desk to help with appointment scheduling. He needs to be seen for med-rec.

## 2020-06-26 ENCOUNTER — Ambulatory Visit (INDEPENDENT_AMBULATORY_CARE_PROVIDER_SITE_OTHER): Payer: Medicare Other | Admitting: Internal Medicine

## 2020-06-26 ENCOUNTER — Encounter: Payer: Self-pay | Admitting: Internal Medicine

## 2020-06-26 ENCOUNTER — Other Ambulatory Visit: Payer: Self-pay

## 2020-06-26 VITALS — BP 127/66 | HR 88 | Temp 98.1°F | Ht 68.0 in | Wt 200.6 lb

## 2020-06-26 DIAGNOSIS — D649 Anemia, unspecified: Secondary | ICD-10-CM | POA: Diagnosis not present

## 2020-06-26 DIAGNOSIS — Z23 Encounter for immunization: Secondary | ICD-10-CM | POA: Diagnosis not present

## 2020-06-26 DIAGNOSIS — I5022 Chronic systolic (congestive) heart failure: Secondary | ICD-10-CM

## 2020-06-26 DIAGNOSIS — N184 Chronic kidney disease, stage 4 (severe): Secondary | ICD-10-CM | POA: Diagnosis not present

## 2020-06-26 DIAGNOSIS — Z8639 Personal history of other endocrine, nutritional and metabolic disease: Secondary | ICD-10-CM

## 2020-06-26 LAB — GLUCOSE, CAPILLARY: Glucose-Capillary: 153 mg/dL — ABNORMAL HIGH (ref 70–99)

## 2020-06-26 LAB — POCT GLYCOSYLATED HEMOGLOBIN (HGB A1C): Hemoglobin A1C: 6 % — AB (ref 4.0–5.6)

## 2020-06-26 MED ORDER — HYDRALAZINE HCL 25 MG PO TABS: 25.0000 mg | ORAL_TABLET | Freq: Three times a day (TID) | ORAL | 2 refills | Status: DC

## 2020-06-26 MED ORDER — ISOSORBIDE MONONITRATE ER 30 MG PO TB24: 30.0000 mg | ORAL_TABLET | Freq: Every day | ORAL | 2 refills | Status: DC

## 2020-06-26 NOTE — Assessment & Plan Note (Signed)
Presents with history of Chronic Kidney Disease stage 4. Follows with [facility] Baseline renal fx with GFR <22 with baseline creatinine 2.5-3.0. Denies significant dyspnea, orthopnea, peripheral edema. Had repeat discussion on plan for progression. Mentions [name] was seen by palliative per nephrology recommendation but mentions [name] does not EVER want to go on dialysis as his mother passed away while on dialysis. States [name] would like to be treated with medications even if [name] were to present with acute renal failure.  Plan - C/w furosemide '80mg'$  BID, lokelma '10mg'$  Bid - C/w goals of care discussion

## 2020-06-26 NOTE — Assessment & Plan Note (Addendum)
CBC Latest Ref Rng & Units 06/09/2020 05/12/2020 01/28/2020  WBC 3.4 - 10.8 x10E3/uL - - -  Hemoglobin 13.0 - 17.0 g/dL 8.5(L) 9.4(L) 11.0(L)  Hematocrit 37.5 - 51.0 % - - -  Platelets 150 - 450 x10E3/uL - - -   Noted to have downtrending hemoglobin. Iron panel show wnl ferritin. Known normocytic anemia due to anemia of chronic disease from CKD. Denies any active bleeds. Receiving erythropoietin and transfusions as needed. Has upcoming appt with nephrology.  - F/u with nephrology for erythropoietin

## 2020-06-26 NOTE — Patient Instructions (Addendum)
  Dear Loreli Dollar,  Thank you for allowing [place] to provide your care today. Today we discussed your blood pressure and kidney disease   I have ordered hemoglobin a1c labs for you. I will call if any are abnormal.    Today we made no changes to your medications:    Please follow-up in 6 months.    Please call the internal medicine center clinic if you have any questions or concerns, we may be able to help and keep you from a long and expensive emergency room wait. Our clinic and after hours phone number is [phone], the best time to call is Monday through Friday 9 am to 4 pm but there is always someone available 24/7 if you have an emergency. If you need medication refills please notify your pharmacy one week in advance and they will send [place] a request.    If you have not gotten the COVID vaccine, I recommend doing so:  You may get it at your local [facility] or [facility] OR To schedule an appointment for a COVID vaccine or be added to the vaccine wait list: Go to [email or web address]   OR Go to [email or web address]                  OR Call [phone]                                     OR Call [phone] and select Option 2  Thank you for choosing [facility]

## 2020-06-26 NOTE — Assessment & Plan Note (Signed)
Wt Readings from Last 3 Encounters:  06/26/20 200 lb 9.6 oz (91 kg)  12/25/19 203 lb (92.1 kg)  10/12/19 196 lb 12.8 oz (89.3 kg)   [name] is a 70yo M w/ PMH of systolic heart failure (4715 ECHO EF 35-40%), HTN,CKD4, ischemic cardiomyopathy s/p CABG presenting to [facility] for management of his chronic conditions. He has been taking his furosemide 59m BID as prescribed his nephrologist. He denies any chest pain, orthopnea, dyspnea or palpitations. He denies any difficulty with med adherence.  A/P Presenting with increasing weight but no evidence of volume overload on physical exam. No respiratory distress or significant edema. Gradual increase may be due to weight gain. Per recent nephrology note, recommended to hold off on Ace/Arb to preserve eGFR. Currently on beta-blocker and nitrates - C/w hydralazine, isosorbide mononitrate - C/w furosemide 879mBID

## 2020-06-26 NOTE — Assessment & Plan Note (Signed)
Has prior hx of elevated hgb a1c at 10.7. Subsequent checks have been below 6 for last 3 years. Not on any diabetic medications. Recent labs at nephrology office does show elevated random blood glucose at 180. Will check although suspect hgb a1c may be falsely low due to anemia - Hgb a1c

## 2020-06-26 NOTE — Progress Notes (Signed)
CC: Fatigue  HPI: [name] is a 70 y.o. with PMH listed below presenting with complaint of fatigue. Please see problem based assessment and plan for further details.  Past Medical History:  Diagnosis Date  . Exertional shortness of breath    "before my last OR; I'm fine now" (09/26/2012)  . GERD (gastroesophageal reflux disease)   . High cholesterol   . Hx of seasonal allergies   . Hypertension   . Myocardial infarction ([place])   . Scrotal edema 01/30/2019  . Stroke [facility]) 2009   memory loss  . Type II diabetes mellitus ([place])    Review of Systems: Review of Systems  Constitutional: Positive for malaise/fatigue. Negative for chills and fever.  Eyes: Negative for blurred vision.  Respiratory: Negative for shortness of breath.   Cardiovascular: Negative for chest pain, palpitations and orthopnea.  Gastrointestinal: Negative for constipation, diarrhea, nausea and vomiting.  Neurological: Negative for dizziness, tingling and headaches.  All other systems reviewed and are negative.   Physical Exam: Vitals:   06/26/20 0844  BP: 127/66  Pulse: 88  Temp: 98.1 F (36.7 C)  TempSrc: Oral  SpO2: 100%  Weight: 200 lb 9.6 oz (91 kg)  Height: 5' 8" (1.727 m)   Gen: Well-developed, well nourished, NAD HEENT: NCAT head, hearing intact CV: RRR, S1, S2 normal Pulm: CTAB, No rales, no wheezes Extm: Stable right sided bka, left sided aka present Skin: Dry, Warm, normal turgor  Assessment & Plan:   Chronic systolic heart failure (HCC) Wt Readings from Last 3 Encounters:  06/26/20 200 lb 9.6 oz (91 kg)  12/25/19 203 lb (92.1 kg)  10/12/19 196 lb 12.8 oz (89.3 kg)   [name] is a 70yo M w/ PMH of systolic heart failure (0539 ECHO EF 35-40%), HTN,CKD4, ischemic cardiomyopathy s/p CABG presenting to [facility] for management of his chronic conditions. He has been taking his furosemide 66m BID as prescribed his nephrologist. He denies any chest pain, orthopnea, dyspnea or  palpitations. He denies any difficulty with med adherence.  A/P Presenting with increasing weight but no evidence of volume overload on physical exam. No respiratory distress or significant edema. Gradual increase may be due to weight gain. Per recent nephrology note, recommended to hold off on Ace/Arb to preserve eGFR. Currently on beta-blocker and nitrates - C/w hydralazine, isosorbide mononitrate - C/w furosemide 866mBID  Need for pneumococcal vaccination Received PSV-23 in 2020. Due for PSV-13. Agree to receive shot today  History of diabetes mellitus Has prior hx of elevated hgb a1c at 10.7. Subsequent checks have been below 6 for last 3 years. Not on any diabetic medications. Recent labs at nephrology office does show elevated random blood glucose at 180. Will check although suspect hgb a1c may be falsely low due to anemia - Hgb a1c  Normocytic anemia CBC Latest Ref Rng & Units 06/09/2020 05/12/2020 01/28/2020  WBC 3.4 - 10.8 x10E3/uL - - -  Hemoglobin 13.0 - 17.0 g/dL 8.5(L) 9.4(L) 11.0(L)  Hematocrit 37.5 - 51.0 % - - -  Platelets 150 - 450 x10E3/uL - - -   Noted to have downtrending hemoglobin. Iron panel show wnl ferritin. Known normocytic anemia due to anemia of chronic disease from CKD. Denies any active bleeds. Receiving erythropoietin and transfusions as needed. Has upcoming appt with nephrology.  - F/u with nephrology for erythropoietin  CKD (chronic kidney disease), [place] IV [place] with history of Chronic Kidney Disease stage 4. [facility]FR [facility]  22 with baseline creatinine 2.5-3.0. Denies significant dyspnea, orthopnea, peripheral edema. Had repeat discussion on plan for progression. Mentions he was seen by palliative per nephrology recommendation but mentions he does not EVER want to go on dialysis as his mother passed away while on dialysis. States he would like to be treated with medications even if he were to present with  acute renal failure.  Plan - C/w furosemide 8m BID, lokelma 165mBid - C/w goals of care discussion   Patient discussed with Dr. ViEvette Doffing-JoGilberto [facility] Pager: [phone]

## 2020-06-26 NOTE — Assessment & Plan Note (Signed)
Received PSV-23 in 2020. Due for PSV-13. Agree to receive shot today

## 2020-06-27 NOTE — Progress Notes (Signed)
Internal Medicine Clinic Attending  Case discussed with Dr. Lee  At the time of the visit.  We reviewed the resident's history and exam and pertinent patient test results.  I agree with the assessment, diagnosis, and plan of care documented in the resident's note.    

## 2020-07-07 ENCOUNTER — Encounter (HOSPITAL_COMMUNITY)
Admission: RE | Admit: 2020-07-07 | Discharge: 2020-07-07 | Disposition: A | Discharge status: Home / Self Care | Payer: Medicare Other | Source: Ambulatory Visit | Attending: Nephrology | Admitting: Nephrology

## 2020-07-07 ENCOUNTER — Other Ambulatory Visit: Payer: Self-pay

## 2020-07-07 VITALS — BP 120/60 | HR 94 | Temp 97.8°F | Resp 18

## 2020-07-07 DIAGNOSIS — N184 Chronic kidney disease, stage 4 (severe): Secondary | ICD-10-CM | POA: Diagnosis present

## 2020-07-07 LAB — POCT HEMOGLOBIN-HEMACUE: Hemoglobin: 8.7 g/dL — ABNORMAL LOW (ref 13.0–17.0)

## 2020-07-07 LAB — FERRITIN: Ferritin: 749 ng/mL — ABNORMAL HIGH (ref 24–336)

## 2020-07-07 LAB — IRON AND TIBC
Iron: 50 ug/dL (ref 45–182)
Saturation Ratios: 18 % (ref 17.9–39.5)
TIBC: 279 ug/dL (ref 250–450)
UIBC: 229 ug/dL

## 2020-07-07 MED ORDER — EPOETIN ALFA-EPBX 2000 UNIT/ML IJ SOLN
INTRAMUSCULAR | Status: AC
  Administered 2020-07-07: 2000 [IU] via SUBCUTANEOUS
  Filled 2020-07-07: qty 1

## 2020-07-07 MED ORDER — EPOETIN ALFA-EPBX 10000 UNIT/ML IJ SOLN
5000.0000 [IU] | INTRAMUSCULAR | Status: DC
Start: 2020-07-07 — End: 2020-07-08

## 2020-07-07 MED ORDER — EPOETIN ALFA-EPBX 3000 UNIT/ML IJ SOLN
INTRAMUSCULAR | Status: AC
  Administered 2020-07-07: 3000 [IU] via SUBCUTANEOUS
  Filled 2020-07-07: qty 1

## 2020-07-26 ENCOUNTER — Encounter: Payer: Self-pay | Admitting: *Deleted

## 2020-08-05 ENCOUNTER — Encounter (HOSPITAL_COMMUNITY)
Admission: RE | Admit: 2020-08-05 | Discharge: 2020-08-05 | Disposition: A | Discharge status: Home / Self Care | Payer: Medicare Other | Source: Ambulatory Visit | Attending: Nephrology | Admitting: Nephrology

## 2020-08-05 ENCOUNTER — Other Ambulatory Visit: Payer: Self-pay

## 2020-08-05 VITALS — BP 123/71 | HR 81 | Temp 98.1°F | Resp 18

## 2020-08-05 DIAGNOSIS — N184 Chronic kidney disease, stage 4 (severe): Secondary | ICD-10-CM | POA: Diagnosis not present

## 2020-08-05 MED ORDER — EPOETIN ALFA-EPBX 10000 UNIT/ML IJ SOLN: 5000.0000 [IU] | INTRAMUSCULAR | Status: DC

## 2020-08-05 MED ORDER — EPOETIN ALFA-EPBX 3000 UNIT/ML IJ SOLN
INTRAMUSCULAR | Status: AC
  Administered 2020-08-05: 3000 [IU] via SUBCUTANEOUS
  Filled 2020-08-05: qty 1

## 2020-08-05 MED ORDER — EPOETIN ALFA-EPBX 2000 UNIT/ML IJ SOLN
INTRAMUSCULAR | Status: AC
  Administered 2020-08-05: 2000 [IU] via SUBCUTANEOUS
  Filled 2020-08-05: qty 1

## 2020-08-06 LAB — POCT HEMOGLOBIN-HEMACUE: Hemoglobin: 9.2 g/dL — ABNORMAL LOW (ref 13.0–17.0)

## 2020-09-02 ENCOUNTER — Ambulatory Visit (HOSPITAL_COMMUNITY)
Admission: RE | Admit: 2020-09-02 | Discharge: 2020-09-02 | Disposition: A | Discharge status: Home / Self Care | Payer: Medicare Other | Source: Ambulatory Visit | Attending: Nephrology | Admitting: Nephrology

## 2020-09-02 ENCOUNTER — Other Ambulatory Visit: Payer: Self-pay

## 2020-09-02 VITALS — BP 121/59 | HR 79 | Temp 98.7°F | Resp 20

## 2020-09-02 DIAGNOSIS — N184 Chronic kidney disease, stage 4 (severe): Secondary | ICD-10-CM | POA: Insufficient documentation

## 2020-09-02 LAB — POCT HEMOGLOBIN-HEMACUE: Hemoglobin: 9.4 g/dL — ABNORMAL LOW (ref 13.0–17.0)

## 2020-09-02 LAB — IRON AND TIBC
Iron: 39 ug/dL — ABNORMAL LOW (ref 45–182)
Saturation Ratios: 15 % — ABNORMAL LOW (ref 17.9–39.5)
TIBC: 260 ug/dL (ref 250–450)
UIBC: 221 ug/dL

## 2020-09-02 LAB — FERRITIN: Ferritin: 451 ng/mL — ABNORMAL HIGH (ref 24–336)

## 2020-09-02 MED ORDER — EPOETIN ALFA-EPBX 10000 UNIT/ML IJ SOLN
5000.0000 [IU] | INTRAMUSCULAR | Status: DC
Start: 2020-09-02 — End: 2020-09-03

## 2020-09-02 MED ORDER — EPOETIN ALFA-EPBX 2000 UNIT/ML IJ SOLN
INTRAMUSCULAR | Status: AC
  Administered 2020-09-02: 2000 [IU] via SUBCUTANEOUS
  Filled 2020-09-02: qty 1

## 2020-09-02 MED ORDER — EPOETIN ALFA-EPBX 3000 UNIT/ML IJ SOLN
INTRAMUSCULAR | Status: AC
  Administered 2020-09-02: 3000 [IU] via SUBCUTANEOUS
  Filled 2020-09-02: qty 1

## 2020-09-28 ENCOUNTER — Other Ambulatory Visit: Payer: Self-pay | Admitting: Internal Medicine

## 2020-09-28 ENCOUNTER — Encounter: Payer: Self-pay | Admitting: *Deleted

## 2020-09-28 DIAGNOSIS — I5022 Chronic systolic (congestive) heart failure: Secondary | ICD-10-CM

## 2020-09-28 NOTE — Progress Notes (Signed)

## 2020-09-30 ENCOUNTER — Ambulatory Visit (HOSPITAL_COMMUNITY)
Admission: RE | Admit: 2020-09-30 | Discharge: 2020-09-30 | Disposition: A | Discharge status: Home / Self Care | Payer: Medicare Other | Source: Ambulatory Visit | Attending: Nephrology | Admitting: Nephrology

## 2020-09-30 ENCOUNTER — Other Ambulatory Visit: Payer: Self-pay

## 2020-09-30 VITALS — BP 138/69 | HR 83 | Temp 98.1°F | Resp 20

## 2020-09-30 DIAGNOSIS — N184 Chronic kidney disease, stage 4 (severe): Secondary | ICD-10-CM | POA: Insufficient documentation

## 2020-09-30 LAB — POCT HEMOGLOBIN-HEMACUE: Hemoglobin: 9.3 g/dL — ABNORMAL LOW (ref 13.0–17.0)

## 2020-09-30 LAB — IRON AND TIBC
Iron: 47 ug/dL (ref 45–182)
Saturation Ratios: 19 % (ref 17.9–39.5)
TIBC: 253 ug/dL (ref 250–450)
UIBC: 206 ug/dL

## 2020-09-30 LAB — FERRITIN: Ferritin: 452 ng/mL — ABNORMAL HIGH (ref 24–336)

## 2020-09-30 MED ORDER — EPOETIN ALFA-EPBX 10000 UNIT/ML IJ SOLN
5000.0000 [IU] | INTRAMUSCULAR | Status: DC
  Administered 2020-09-30: 5000 [IU] via SUBCUTANEOUS

## 2020-09-30 MED ORDER — EPOETIN ALFA-EPBX 10000 UNIT/ML IJ SOLN
INTRAMUSCULAR | Status: AC
  Filled 2020-09-30: qty 1

## 2020-09-30 NOTE — Progress Notes (Signed)
Annual Wellness Visit   Medicare Covered Preventative Screenings and Services  Services & Screenings Men and Women Who How Often Need? Date of Last Service Action  Abdominal Aortic Aneurysm Adults with AAA risk factors Once     Alcohol Misuse and Counseling All Adults Screening once a year if no alcohol misuse. Counseling up to 4 face to face sessions.     Bone Density Measurement  Adults at risk for osteoporosis Once every 2 yrs     Lipid Panel Z13.6 All adults without CV disease Once every 5 yrs     Colorectal Cancer  Stool sample or Colonoscopy All adults 37 and older  Once every year Every 10 years     Depression All Adults Once a year  Today   Diabetes Screening Blood glucose, post glucose load, or GTT Z13.1 All adults at risk Pre-diabetics Once per year Twice per year     Diabetes  Self-Management Training All adults Diabetics 10 hrs first year; 2 hours subsequent years. Requires Copay     Glaucoma Diabetics Family history of glaucoma African Americans 21 yrs + Hispanic Americans 41 yrs + Annually - requires coppay X    Hepatitis C Z72.89 or F19.20 High Risk for HCV Born between 1945 and 1965 Annually Once     HIV Z11.4 All adults based on risk Annually btw ages 78 & 41 regardless of risk Annually > 65 yrs if at increased risk     Lung Cancer Screening Asymptomatic adults aged 31-77 with 30 pack yr history and current smoker OR quit within the last 15 yrs Annually Must have counseling and shared decision making documentation before first screen     Medical Nutrition Therapy Adults with  Diabetes Renal disease Kidney transplant within past 3 yrs 3 hours first year; 2 hours subsequent years X    Obesity and Counseling All adults Screening once a year Counseling if BMI 30 or higher X Today   Tobacco Use Counseling Adults who use tobacco  Up to 8 visits in one year     Vaccines Z23 Hepatitis B Influenza  Pneumonia  Adults  Once Once every flu season Two different  vaccines separated by one year     Next Annual Wellness Visit People with Medicare Every year  Today     Services & Screenings Women Who How Often Need  Date of Last Service Action  Mammogram  Z12.31 Women over 92 One baseline ages 67-39. Annually ager 40 yrs+     Pap tests All women Annually if high risk. Every 2 yrs for normal risk women     Screening for cervical cancer with  Pap (Z01.419 nl or Z01.411abnl) & HPV Z11.51 Women aged 22 to 67 Once every 5 yrs     Screening pelvic and breast exams All women Annually if high risk. Every 2 yrs for normal risk women     Sexually Transmitted Diseases Chlamydia Gonorrhea Syphilis All at risk adults Annually for non pregnant females at increased risk         [place] Men Who How Ofter Need  Date of Last Service Action  Prostate Cancer - DRE & PSA Men over 50 Annually.  DRE might require a copay. X    Sexually Transmitted Diseases Syphilis All at risk adults Annually for men at increased risk         Things That May Be Affecting Your Health:  Alcohol  Hearing loss  Pain    Depression  Home  Safety  Sexual Health  X Diabetes  Lack of physical activity  Stress   Difficulty with daily activities  Loneliness  Tiredness   Drug use  Medicines  Tobacco use   Falls  Motor Vehicle Safety X Weight   Food choices  Oral Health X Other    YOUR PERSONALIZED HEALTH PLAN : 1. Schedule your next subsequent Medicare Wellness visit in one year 2. Attend all of your regular appointments to address your medical issues 3. Complete the preventative screenings and services

## 2020-10-28 ENCOUNTER — Ambulatory Visit (HOSPITAL_COMMUNITY)
Admission: RE | Admit: 2020-10-28 | Discharge: 2020-10-28 | Disposition: A | Discharge status: Home / Self Care | Payer: Medicare Other | Source: Ambulatory Visit | Attending: Nephrology | Admitting: Nephrology

## 2020-10-28 ENCOUNTER — Other Ambulatory Visit: Payer: Self-pay

## 2020-10-28 ENCOUNTER — Encounter (HOSPITAL_COMMUNITY): Payer: Medicare Other

## 2020-10-28 VITALS — BP 125/64 | HR 74 | Temp 98.1°F | Resp 20

## 2020-10-28 DIAGNOSIS — N184 Chronic kidney disease, stage 4 (severe): Secondary | ICD-10-CM | POA: Insufficient documentation

## 2020-10-28 LAB — IRON AND TIBC
Iron: 49 ug/dL (ref 45–182)
Saturation Ratios: 18 % (ref 17.9–39.5)
TIBC: 266 ug/dL (ref 250–450)
UIBC: 217 ug/dL

## 2020-10-28 LAB — FERRITIN: Ferritin: 579 ng/mL — ABNORMAL HIGH (ref 24–336)

## 2020-10-28 LAB — POCT HEMOGLOBIN-HEMACUE: Hemoglobin: 9.4 g/dL — ABNORMAL LOW (ref 13.0–17.0)

## 2020-10-28 MED ORDER — EPOETIN ALFA-EPBX 10000 UNIT/ML IJ SOLN
INTRAMUSCULAR | Status: AC
  Filled 2020-10-28: qty 1

## 2020-10-28 MED ORDER — EPOETIN ALFA-EPBX 10000 UNIT/ML IJ SOLN
5000.0000 [IU] | INTRAMUSCULAR | Status: DC
  Administered 2020-10-28: 5000 [IU] via SUBCUTANEOUS

## 2020-11-25 ENCOUNTER — Encounter (HOSPITAL_COMMUNITY)
Admission: RE | Admit: 2020-11-25 | Discharge: 2020-11-25 | Disposition: A | Discharge status: Home / Self Care | Payer: Medicare Other | Source: Ambulatory Visit | Attending: Nephrology | Admitting: Nephrology

## 2020-11-25 VITALS — BP 128/67 | HR 84 | Temp 98.7°F | Resp 18

## 2020-11-25 DIAGNOSIS — N184 Chronic kidney disease, stage 4 (severe): Secondary | ICD-10-CM | POA: Diagnosis present

## 2020-11-25 LAB — FERRITIN: Ferritin: 699 ng/mL — ABNORMAL HIGH (ref 24–336)

## 2020-11-25 LAB — POCT HEMOGLOBIN-HEMACUE: Hemoglobin: 10.9 g/dL — ABNORMAL LOW (ref 13.0–17.0)

## 2020-11-25 LAB — IRON AND TIBC
Iron: 67 ug/dL (ref 45–182)
Saturation Ratios: 24 % (ref 17.9–39.5)
TIBC: 276 ug/dL (ref 250–450)
UIBC: 209 ug/dL

## 2020-11-25 MED ORDER — EPOETIN ALFA-EPBX 2000 UNIT/ML IJ SOLN
INTRAMUSCULAR | Status: AC
  Administered 2020-11-25: 2000 [IU] via SUBCUTANEOUS
  Filled 2020-11-25: qty 1

## 2020-11-25 MED ORDER — EPOETIN ALFA-EPBX 3000 UNIT/ML IJ SOLN
INTRAMUSCULAR | Status: AC
  Administered 2020-11-25: 3000 [IU] via SUBCUTANEOUS
  Filled 2020-11-25: qty 1

## 2020-11-25 MED ORDER — EPOETIN ALFA-EPBX 10000 UNIT/ML IJ SOLN: 5000.0000 [IU] | INTRAMUSCULAR | Status: DC

## 2020-12-23 ENCOUNTER — Encounter (HOSPITAL_COMMUNITY)
Admission: RE | Admit: 2020-12-23 | Discharge: 2020-12-23 | Disposition: A | Discharge status: Home / Self Care | Payer: Medicare Other | Source: Ambulatory Visit | Attending: Nephrology | Admitting: Nephrology

## 2020-12-23 VITALS — BP 135/79 | HR 73 | Temp 97.4°F | Resp 18

## 2020-12-23 DIAGNOSIS — N184 Chronic kidney disease, stage 4 (severe): Secondary | ICD-10-CM | POA: Insufficient documentation

## 2020-12-23 LAB — IRON AND TIBC
Iron: 58 ug/dL (ref 45–182)
Saturation Ratios: 23 % (ref 17.9–39.5)
TIBC: 255 ug/dL (ref 250–450)
UIBC: 197 ug/dL

## 2020-12-23 LAB — FERRITIN: Ferritin: 702 ng/mL — ABNORMAL HIGH (ref 24–336)

## 2020-12-23 LAB — POCT HEMOGLOBIN-HEMACUE: Hemoglobin: 9.5 g/dL — ABNORMAL LOW (ref 13.0–17.0)

## 2020-12-23 MED ORDER — EPOETIN ALFA-EPBX 10000 UNIT/ML IJ SOLN
INTRAMUSCULAR | Status: AC
  Filled 2020-12-23: qty 1

## 2020-12-23 MED ORDER — EPOETIN ALFA-EPBX 10000 UNIT/ML IJ SOLN
5000.0000 [IU] | INTRAMUSCULAR | Status: DC
  Administered 2020-12-23: 5000 [IU] via SUBCUTANEOUS

## 2020-12-26 ENCOUNTER — Other Ambulatory Visit: Payer: Self-pay | Admitting: Internal Medicine

## 2020-12-26 DIAGNOSIS — I5022 Chronic systolic (congestive) heart failure: Secondary | ICD-10-CM

## 2021-01-08 ENCOUNTER — Other Ambulatory Visit: Payer: Self-pay

## 2021-01-08 ENCOUNTER — Encounter: Payer: Self-pay | Admitting: Student

## 2021-01-08 ENCOUNTER — Ambulatory Visit (INDEPENDENT_AMBULATORY_CARE_PROVIDER_SITE_OTHER): Payer: Medicare Other | Admitting: Student

## 2021-01-08 VITALS — BP 150/91 | HR 80 | Temp 98.7°F | Resp 28 | Ht 68.0 in | Wt 229.1 lb

## 2021-01-08 DIAGNOSIS — Z89512 Acquired absence of left leg below knee: Secondary | ICD-10-CM

## 2021-01-08 DIAGNOSIS — G546 Phantom limb syndrome with pain: Secondary | ICD-10-CM | POA: Insufficient documentation

## 2021-01-08 DIAGNOSIS — Z89511 Acquired absence of right leg below knee: Secondary | ICD-10-CM

## 2021-01-08 MED ORDER — GABAPENTIN 300 MG PO CAPS: 300.0000 mg | ORAL_CAPSULE | Freq: Every day | ORAL | 0 refills | Status: AC

## 2021-01-08 NOTE — Progress Notes (Signed)
CC: Bilateral lower extremity pain  HPI:  [name] is a 70 y.o. male with a past medical history stated below and presents today for bilateral lower extremity pain. Please see problem based assessment and plan for additional details.  Past Medical History:  Diagnosis Date   Exertional shortness of breath    "before my last OR; I'm fine now" (09/26/2012)   GERD (gastroesophageal reflux disease)    High cholesterol    Hx of seasonal allergies    Hypertension    Myocardial infarction [facility])    Scrotal edema 01/30/2019   Stroke [facility]) 2009   memory loss   Type II diabetes mellitus ([place])     Current Outpatient Medications on File Prior to Visit  Medication Sig Dispense Refill   hydrALAZINE (APRESOLINE) 25 MG tablet TAKE 1 TABLET BY MOUTH THREE TIMES A DAY 270 tablet 0   aspirin EC 81 MG tablet Take 81 mg by mouth every morning.     atorvastatin (LIPITOR) 40 MG tablet Take 1 tablet (40 mg total) by mouth every morning. 30 tablet 0   Cyanocobalamin (VITAMIN B-12 PO) Take 1 tablet by mouth every morning.     furosemide (LASIX) 80 MG tablet Take 1 tablet (80 mg total) by mouth 2 (two) times daily. 180 tablet 3   isosorbide mononitrate (IMDUR) 30 MG 24 hr tablet Take 1 tablet (30 mg total) by mouth daily. 90 tablet 2   metoprolol succinate (TOPROL-XL) 50 MG 24 hr tablet Take 50 mg by mouth every morning.     nitroGLYCERIN (NITROSTAT) 0.4 MG SL tablet Place 0.4 mg under the tongue every 5 (five) minutes as needed for chest pain.     pantoprazole (PROTONIX) 40 MG tablet Take 1 tablet (40 mg total) by mouth daily at 6 (six) AM. 30 tablet 3   sodium zirconium cyclosilicate (LOKELMA) 10 g PACK packet Take 10 g by mouth every other day.     tamsulosin (FLOMAX) 0.4 MG CAPS capsule Take 0.4 mg by mouth every morning.     No current facility-administered medications on file prior to visit.    No family history on file.  Social History   Socioeconomic History   Marital status:  Legally Separated    Spouse name: Not on file   Number of children: Not on file   Years of education: Not on file   Highest education level: Not on file  Occupational History   Not on file  Tobacco Use   Smoking status: Former    Packs/day: 0.12    Years: 20.00    Pack years: 2.40    Types: Cigarettes   Smokeless tobacco: Never  Vaping Use   Vaping Use: Never used  Substance and Sexual Activity   Alcohol use: No    Comment: last drink 2010   Drug use: No   Sexual activity: Never  Other Topics Concern   Not on file  Social History Narrative   Not on file   Social Determinants of Health   Financial Resource Strain: Not on file  Food Insecurity: Not on file  Transportation Needs: Not on file  Physical Activity: Not on file  Stress: Not on file  Social Connections: Not on file  Intimate Partner Violence: Not on file    Review of Systems: ROS negative except for what is noted on the assessment and plan.  Vitals:   01/08/21 1417  BP: (!) 150/91  Pulse: 80  Resp: (!) 28  Temp: 98.7  F (37.1 C)  TempSrc: Oral  SpO2: 99%  Weight: 229 lb 1.6 oz (103.9 kg)  Height: 5\' 8"  (1.727 m)     Physical Exam: Constitutional: Elderly appearing, no acute distress HENT: normocephalic atraumatic, mucous membranes moist Eyes: conjunctiva non-erythematous Neck: supple Cardiovascular: regular rate Pulmonary/Chest: normal work of breathing on room air Abdominal: soft, non-tender, non-distended MSK: normal bulk and tone.  Right-sided BKA, left-sided BKA with prosthetics in place Neurological: alert & oriented x 3 Skin: warm and dry Psych: Normal mood and thought process   Assessment & Plan:   See Encounters Tab for problem based charting.  Patient discussed with Dr. Delano Metz, D.O. [place] [facility] [place] [facility], PGY-2 Pager: [phone], Phone: [phone] Date 01/08/2021 Time 7:42 PM

## 2021-01-08 NOTE — Assessment & Plan Note (Signed)
Assessment: Patient with history of right-sided BKA and left-sided AKA, endorsing bilateral phantom limb pain.  He refused full examination of his limbs.  He did not want to take off his prosthetics.  Discussed with him that I would like to examine him for any redness, bruises, or sores.  He states he has no sores he has no wounds to his area.  He continued to refuse.  He states that he has had some swelling of his limbs.  We will start patient on gabapentin 300 mg daily.  He states in the past he had compression socks however he lost them when he moved to the area.  We will place referral for new prosthetic shrinker stockings.  Discussed with patient the importance of only taking the gabapentin once a day as he does have a history of kidney disease.  I also reach out to his nephrologist to let him know that we have started this medication.  Plan: -Gabapentin 300 mg daily -Prostatic drinker ordered via DME

## 2021-01-08 NOTE — Patient Instructions (Addendum)
Thank you, [name] for allowing [place] to provide your care today. Today we discussed .  Lower Leg Pain Please take one gabapentin pill in the evening.  I will work with our team to get you new socks.   If you notice wounds on your legs, please call our clinic immediately. If the swelling stays and does not improve, please call our clinic.     I have ordered the following labs for you:  Lab Orders  No laboratory test(s) ordered today     Referrals ordered today:   Referral Orders  No referral(s) requested today     I have ordered the following medication/changed the following medications:   Stop the following medications: There are no discontinued medications.   Start the following medications: Meds ordered this encounter  Medications   gabapentin (NEURONTIN) 300 MG capsule    Sig: Take 1 capsule (300 mg total) by mouth at bedtime.    Dispense:  90 capsule    Refill:  0     Follow up: As needed or if wounds develop on your legs    Should you have any questions or concerns please call the internal medicine clinic at [phone].    Sanjuana Letters, D.O. [place]

## 2021-01-11 ENCOUNTER — Inpatient Hospital Stay (HOSPITAL_COMMUNITY): Payer: Medicare Other

## 2021-01-11 ENCOUNTER — Emergency Department (HOSPITAL_COMMUNITY): Payer: Medicare Other

## 2021-01-11 ENCOUNTER — Other Ambulatory Visit: Payer: Self-pay

## 2021-01-11 ENCOUNTER — Encounter (HOSPITAL_COMMUNITY): Payer: Self-pay | Admitting: Emergency Medicine

## 2021-01-11 ENCOUNTER — Inpatient Hospital Stay (HOSPITAL_COMMUNITY)
Admission: EM | Admit: 2021-01-11 | Discharge: 2021-01-25 | DRG: 264 | Disposition: A | Discharge status: Rehabilitation Facility | Payer: Medicare Other | Attending: Internal Medicine | Admitting: Internal Medicine

## 2021-01-11 DIAGNOSIS — E1151 Type 2 diabetes mellitus with diabetic peripheral angiopathy without gangrene: Secondary | ICD-10-CM | POA: Diagnosis present

## 2021-01-11 DIAGNOSIS — I5023 Acute on chronic systolic (congestive) heart failure: Secondary | ICD-10-CM | POA: Diagnosis present

## 2021-01-11 DIAGNOSIS — I469 Cardiac arrest, cause unspecified: Secondary | ICD-10-CM | POA: Diagnosis not present

## 2021-01-11 DIAGNOSIS — R569 Unspecified convulsions: Secondary | ICD-10-CM

## 2021-01-11 DIAGNOSIS — I69354 Hemiplegia and hemiparesis following cerebral infarction affecting left non-dominant side: Secondary | ICD-10-CM

## 2021-01-11 DIAGNOSIS — D696 Thrombocytopenia, unspecified: Secondary | ICD-10-CM | POA: Diagnosis not present

## 2021-01-11 DIAGNOSIS — E876 Hypokalemia: Secondary | ICD-10-CM | POA: Diagnosis present

## 2021-01-11 DIAGNOSIS — I4729 Other ventricular tachycardia: Secondary | ICD-10-CM

## 2021-01-11 DIAGNOSIS — K761 Chronic passive congestion of liver: Secondary | ICD-10-CM | POA: Diagnosis present

## 2021-01-11 DIAGNOSIS — R34 Anuria and oliguria: Secondary | ICD-10-CM | POA: Diagnosis not present

## 2021-01-11 DIAGNOSIS — R7309 Other abnormal glucose: Secondary | ICD-10-CM | POA: Diagnosis not present

## 2021-01-11 DIAGNOSIS — G9349 Other encephalopathy: Secondary | ICD-10-CM | POA: Diagnosis not present

## 2021-01-11 DIAGNOSIS — I249 Acute ischemic heart disease, unspecified: Secondary | ICD-10-CM

## 2021-01-11 DIAGNOSIS — I2510 Atherosclerotic heart disease of native coronary artery without angina pectoris: Secondary | ICD-10-CM | POA: Diagnosis present

## 2021-01-11 DIAGNOSIS — Z7982 Long term (current) use of aspirin: Secondary | ICD-10-CM | POA: Diagnosis not present

## 2021-01-11 DIAGNOSIS — D509 Iron deficiency anemia, unspecified: Secondary | ICD-10-CM | POA: Diagnosis present

## 2021-01-11 DIAGNOSIS — R509 Fever, unspecified: Secondary | ICD-10-CM | POA: Diagnosis not present

## 2021-01-11 DIAGNOSIS — Z992 Dependence on renal dialysis: Secondary | ICD-10-CM

## 2021-01-11 DIAGNOSIS — I420 Dilated cardiomyopathy: Secondary | ICD-10-CM | POA: Diagnosis present

## 2021-01-11 DIAGNOSIS — I6329 Cerebral infarction due to unspecified occlusion or stenosis of other precerebral arteries: Secondary | ICD-10-CM | POA: Diagnosis not present

## 2021-01-11 DIAGNOSIS — G4700 Insomnia, unspecified: Secondary | ICD-10-CM | POA: Diagnosis present

## 2021-01-11 DIAGNOSIS — D631 Anemia in chronic kidney disease: Secondary | ICD-10-CM | POA: Diagnosis present

## 2021-01-11 DIAGNOSIS — I639 Cerebral infarction, unspecified: Secondary | ICD-10-CM

## 2021-01-11 DIAGNOSIS — Z79899 Other long term (current) drug therapy: Secondary | ICD-10-CM

## 2021-01-11 DIAGNOSIS — I509 Heart failure, unspecified: Secondary | ICD-10-CM | POA: Diagnosis not present

## 2021-01-11 DIAGNOSIS — R5381 Other malaise: Secondary | ICD-10-CM | POA: Diagnosis not present

## 2021-01-11 DIAGNOSIS — Z20822 Contact with and (suspected) exposure to covid-19: Secondary | ICD-10-CM | POA: Diagnosis present

## 2021-01-11 DIAGNOSIS — Z7189 Other specified counseling: Secondary | ICD-10-CM

## 2021-01-11 DIAGNOSIS — Z89512 Acquired absence of left leg below knee: Secondary | ICD-10-CM | POA: Diagnosis not present

## 2021-01-11 DIAGNOSIS — R531 Weakness: Secondary | ICD-10-CM | POA: Diagnosis not present

## 2021-01-11 DIAGNOSIS — E872 Acidosis, unspecified: Secondary | ICD-10-CM | POA: Diagnosis not present

## 2021-01-11 DIAGNOSIS — I132 Hypertensive heart and chronic kidney disease with heart failure and with stage 5 chronic kidney disease, or end stage renal disease: Secondary | ICD-10-CM | POA: Diagnosis present

## 2021-01-11 DIAGNOSIS — I472 Ventricular tachycardia, unspecified: Secondary | ICD-10-CM | POA: Diagnosis present

## 2021-01-11 DIAGNOSIS — N400 Enlarged prostate without lower urinary tract symptoms: Secondary | ICD-10-CM | POA: Diagnosis not present

## 2021-01-11 DIAGNOSIS — N179 Acute kidney failure, unspecified: Secondary | ICD-10-CM | POA: Diagnosis present

## 2021-01-11 DIAGNOSIS — E7800 Pure hypercholesterolemia, unspecified: Secondary | ICD-10-CM | POA: Diagnosis present

## 2021-01-11 DIAGNOSIS — E1165 Type 2 diabetes mellitus with hyperglycemia: Secondary | ICD-10-CM

## 2021-01-11 DIAGNOSIS — N189 Chronic kidney disease, unspecified: Secondary | ICD-10-CM | POA: Diagnosis not present

## 2021-01-11 DIAGNOSIS — J9601 Acute respiratory failure with hypoxia: Secondary | ICD-10-CM | POA: Diagnosis not present

## 2021-01-11 DIAGNOSIS — I4430 Unspecified atrioventricular block: Secondary | ICD-10-CM | POA: Diagnosis present

## 2021-01-11 DIAGNOSIS — I5022 Chronic systolic (congestive) heart failure: Secondary | ICD-10-CM

## 2021-01-11 DIAGNOSIS — I953 Hypotension of hemodialysis: Secondary | ICD-10-CM | POA: Diagnosis not present

## 2021-01-11 DIAGNOSIS — I248 Other forms of acute ischemic heart disease: Secondary | ICD-10-CM | POA: Diagnosis present

## 2021-01-11 DIAGNOSIS — K219 Gastro-esophageal reflux disease without esophagitis: Secondary | ICD-10-CM | POA: Diagnosis present

## 2021-01-11 DIAGNOSIS — Z7902 Long term (current) use of antithrombotics/antiplatelets: Secondary | ICD-10-CM | POA: Diagnosis not present

## 2021-01-11 DIAGNOSIS — R29810 Facial weakness: Secondary | ICD-10-CM | POA: Diagnosis not present

## 2021-01-11 DIAGNOSIS — Z89511 Acquired absence of right leg below knee: Secondary | ICD-10-CM

## 2021-01-11 DIAGNOSIS — E1122 Type 2 diabetes mellitus with diabetic chronic kidney disease: Secondary | ICD-10-CM | POA: Diagnosis present

## 2021-01-11 DIAGNOSIS — D72829 Elevated white blood cell count, unspecified: Secondary | ICD-10-CM | POA: Diagnosis present

## 2021-01-11 DIAGNOSIS — K5901 Slow transit constipation: Secondary | ICD-10-CM | POA: Diagnosis not present

## 2021-01-11 DIAGNOSIS — D649 Anemia, unspecified: Secondary | ICD-10-CM | POA: Diagnosis not present

## 2021-01-11 DIAGNOSIS — I350 Nonrheumatic aortic (valve) stenosis: Secondary | ICD-10-CM | POA: Diagnosis not present

## 2021-01-11 DIAGNOSIS — I252 Old myocardial infarction: Secondary | ICD-10-CM

## 2021-01-11 DIAGNOSIS — I4510 Unspecified right bundle-branch block: Secondary | ICD-10-CM | POA: Diagnosis present

## 2021-01-11 DIAGNOSIS — N186 End stage renal disease: Secondary | ICD-10-CM | POA: Diagnosis present

## 2021-01-11 DIAGNOSIS — R278 Other lack of coordination: Secondary | ICD-10-CM | POA: Diagnosis not present

## 2021-01-11 DIAGNOSIS — D6959 Other secondary thrombocytopenia: Secondary | ICD-10-CM | POA: Diagnosis not present

## 2021-01-11 DIAGNOSIS — I69398 Other sequelae of cerebral infarction: Secondary | ICD-10-CM | POA: Diagnosis not present

## 2021-01-11 DIAGNOSIS — Z87891 Personal history of nicotine dependence: Secondary | ICD-10-CM

## 2021-01-11 DIAGNOSIS — Z8249 Family history of ischemic heart disease and other diseases of the circulatory system: Secondary | ICD-10-CM

## 2021-01-11 DIAGNOSIS — Z515 Encounter for palliative care: Secondary | ICD-10-CM | POA: Diagnosis not present

## 2021-01-11 DIAGNOSIS — Z951 Presence of aortocoronary bypass graft: Secondary | ICD-10-CM

## 2021-01-11 DIAGNOSIS — R7401 Elevation of levels of liver transaminase levels: Secondary | ICD-10-CM | POA: Diagnosis present

## 2021-01-11 DIAGNOSIS — I255 Ischemic cardiomyopathy: Secondary | ICD-10-CM | POA: Diagnosis present

## 2021-01-11 HISTORY — DX: Heart failure, unspecified: I50.9

## 2021-01-11 HISTORY — DX: Disorder of kidney and ureter, unspecified: N28.9

## 2021-01-11 LAB — URINALYSIS, COMPLETE (UACMP) WITH MICROSCOPIC
Bacteria, UA: NONE SEEN
Bilirubin Urine: NEGATIVE
Glucose, UA: NEGATIVE mg/dL
Hgb urine dipstick: NEGATIVE
Ketones, ur: NEGATIVE mg/dL
Leukocytes,Ua: NEGATIVE
Nitrite: NEGATIVE
Protein, ur: 30 mg/dL — AB
Specific Gravity, Urine: 1.008 (ref 1.005–1.030)
pH: 5 (ref 5.0–8.0)

## 2021-01-11 LAB — COMPREHENSIVE METABOLIC PANEL
ALT: 38 U/L (ref 0–44)
AST: 46 U/L — ABNORMAL HIGH (ref 15–41)
Albumin: 3.4 g/dL — ABNORMAL LOW (ref 3.5–5.0)
Alkaline Phosphatase: 85 U/L (ref 38–126)
Anion gap: 14 (ref 5–15)
BUN: 40 mg/dL — ABNORMAL HIGH (ref 8–23)
CO2: 25 mmol/L (ref 22–32)
Calcium: 8.4 mg/dL — ABNORMAL LOW (ref 8.9–10.3)
Chloride: 98 mmol/L (ref 98–111)
Creatinine, Ser: 4.5 mg/dL — ABNORMAL HIGH (ref 0.61–1.24)
GFR, Estimated: 13 mL/min — ABNORMAL LOW (ref >60)
Glucose, Bld: 191 mg/dL — ABNORMAL HIGH (ref 70–99)
Potassium: 2.5 mmol/L — CL (ref 3.5–5.1)
Sodium: 137 mmol/L (ref 135–145)
Total Bilirubin: 1.1 mg/dL (ref 0.3–1.2)
Total Protein: 6.5 g/dL (ref 6.5–8.1)

## 2021-01-11 LAB — GLUCOSE, CAPILLARY: Glucose-Capillary: 177 mg/dL — ABNORMAL HIGH (ref 70–99)

## 2021-01-11 LAB — CBC WITH DIFFERENTIAL/PLATELET
Abs Immature Granulocytes: 0.02 10*3/uL (ref 0.00–0.07)
Basophils Absolute: 0 10*3/uL (ref 0.0–0.1)
Basophils Relative: 1 %
Eosinophils Absolute: 0.2 10*3/uL (ref 0.0–0.5)
Eosinophils Relative: 4 %
HCT: 30.8 % — ABNORMAL LOW (ref 39.0–52.0)
Hemoglobin: 9.4 g/dL — ABNORMAL LOW (ref 13.0–17.0)
Immature Granulocytes: 0 %
Lymphocytes Relative: 25 %
Lymphs Abs: 1.5 10*3/uL (ref 0.7–4.0)
MCH: 28.1 pg (ref 26.0–34.0)
MCHC: 30.5 g/dL (ref 30.0–36.0)
MCV: 91.9 fL (ref 80.0–100.0)
Monocytes Absolute: 0.7 10*3/uL (ref 0.1–1.0)
Monocytes Relative: 11 %
Neutro Abs: 3.6 10*3/uL (ref 1.7–7.7)
Neutrophils Relative %: 59 %
Platelets: 175 10*3/uL (ref 150–400)
RBC: 3.35 MIL/uL — ABNORMAL LOW (ref 4.22–5.81)
RDW: 15.6 % — ABNORMAL HIGH (ref 11.5–15.5)
WBC: 6 10*3/uL (ref 4.0–10.5)
nRBC: 0 % (ref 0.0–0.2)

## 2021-01-11 LAB — TROPONIN I (HIGH SENSITIVITY)
Troponin I (High Sensitivity): 207 ng/L (ref <18)
Troponin I (High Sensitivity): 218 ng/L (ref <18)
Troponin I (High Sensitivity): 183 ng/L (ref <18)

## 2021-01-11 LAB — RESP PANEL BY RT-PCR (FLU A&B, COVID) ARPGX2
Influenza A by PCR: NEGATIVE
Influenza B by PCR: NEGATIVE
SARS Coronavirus 2 by RT PCR: NEGATIVE

## 2021-01-11 LAB — CBG MONITORING, ED: Glucose-Capillary: 155 mg/dL — ABNORMAL HIGH (ref 70–99)

## 2021-01-11 LAB — POTASSIUM: Potassium: 3.1 mmol/L — ABNORMAL LOW (ref 3.5–5.1)

## 2021-01-11 LAB — SODIUM, URINE, RANDOM: Sodium, Ur: 43 mmol/L

## 2021-01-11 LAB — MAGNESIUM: Magnesium: 2.5 mg/dL — ABNORMAL HIGH (ref 1.7–2.4)

## 2021-01-11 LAB — PHOSPHORUS: Phosphorus: 3.6 mg/dL (ref 2.5–4.6)

## 2021-01-11 LAB — BRAIN NATRIURETIC PEPTIDE: B Natriuretic Peptide: >4500 pg/mL — ABNORMAL HIGH (ref 0.0–100.0)

## 2021-01-11 MED ORDER — POTASSIUM CHLORIDE 10 MEQ/100ML IV SOLN
10.0000 meq | INTRAVENOUS | Status: AC
  Administered 2021-01-11 – 2021-01-12 (×2): 10 meq via INTRAVENOUS
  Filled 2021-01-11 (×2): qty 100

## 2021-01-11 MED ORDER — FUROSEMIDE 10 MG/ML IJ SOLN
80.0000 mg | Freq: Once | INTRAMUSCULAR | Status: AC
  Administered 2021-01-11: 80 mg via INTRAVENOUS
  Filled 2021-01-11: qty 8

## 2021-01-11 MED ORDER — PANTOPRAZOLE SODIUM 40 MG PO TBEC
40.0000 mg | DELAYED_RELEASE_TABLET | Freq: Every day | ORAL | Status: DC
  Administered 2021-01-12 – 2021-01-25 (×13): 40 mg via ORAL
  Filled 2021-01-11 (×15): qty 1

## 2021-01-11 MED ORDER — POTASSIUM CHLORIDE 10 MEQ/100ML IV SOLN
10.0000 meq | INTRAVENOUS | Status: DC
Start: 2021-01-11 — End: 2021-01-11
  Filled 2021-01-11: qty 100

## 2021-01-11 MED ORDER — TAMSULOSIN HCL 0.4 MG PO CAPS
0.4000 mg | ORAL_CAPSULE | Freq: Every morning | ORAL | Status: DC
  Administered 2021-01-11 – 2021-01-21 (×9): 0.4 mg via ORAL
  Filled 2021-01-11 (×10): qty 1

## 2021-01-11 MED ORDER — ATORVASTATIN CALCIUM 40 MG PO TABS
40.0000 mg | ORAL_TABLET | Freq: Every morning | ORAL | Status: DC
  Administered 2021-01-11 – 2021-01-22 (×11): 40 mg via ORAL
  Filled 2021-01-11 (×12): qty 1

## 2021-01-11 MED ORDER — POTASSIUM CHLORIDE 10 MEQ/100ML IV SOLN
10.0000 meq | INTRAVENOUS | Status: AC
  Administered 2021-01-11 (×2): 10 meq via INTRAVENOUS
  Filled 2021-01-11: qty 100

## 2021-01-11 MED ORDER — ACETAMINOPHEN 325 MG PO TABS
650.0000 mg | ORAL_TABLET | Freq: Four times a day (QID) | ORAL | Status: DC | PRN
  Administered 2021-01-12: 650 mg via ORAL
  Filled 2021-01-11: qty 2

## 2021-01-11 MED ORDER — ACETAMINOPHEN 650 MG RE SUPP: 650.0000 mg | Freq: Four times a day (QID) | RECTAL | Status: DC | PRN

## 2021-01-11 MED ORDER — ISOSORBIDE MONONITRATE ER 30 MG PO TB24
30.0000 mg | ORAL_TABLET | Freq: Every day | ORAL | Status: DC
  Administered 2021-01-11 – 2021-01-21 (×10): 30 mg via ORAL
  Filled 2021-01-11 (×11): qty 1

## 2021-01-11 MED ORDER — ASPIRIN EC 81 MG PO TBEC
81.0000 mg | DELAYED_RELEASE_TABLET | Freq: Every morning | ORAL | Status: DC
  Administered 2021-01-11 – 2021-01-25 (×14): 81 mg via ORAL
  Filled 2021-01-11 (×15): qty 1

## 2021-01-11 MED ORDER — CLOPIDOGREL BISULFATE 75 MG PO TABS
75.0000 mg | ORAL_TABLET | Freq: Every day | ORAL | Status: DC
  Administered 2021-01-11 – 2021-01-25 (×14): 75 mg via ORAL
  Filled 2021-01-11 (×15): qty 1

## 2021-01-11 MED ORDER — INSULIN ASPART 100 UNIT/ML IJ SOLN
0.0000 [IU] | Freq: Three times a day (TID) | INTRAMUSCULAR | Status: DC
  Administered 2021-01-11: 3 [IU] via SUBCUTANEOUS
  Administered 2021-01-12: 5 [IU] via SUBCUTANEOUS
  Administered 2021-01-12: 2 [IU] via SUBCUTANEOUS
  Administered 2021-01-12 – 2021-01-14 (×6): 3 [IU] via SUBCUTANEOUS
  Administered 2021-01-15 (×2): 2 [IU] via SUBCUTANEOUS
  Administered 2021-01-16: 3 [IU] via SUBCUTANEOUS
  Administered 2021-01-16: 5 [IU] via SUBCUTANEOUS
  Administered 2021-01-16: 3 [IU] via SUBCUTANEOUS
  Administered 2021-01-17 (×2): 2 [IU] via SUBCUTANEOUS
  Administered 2021-01-18: 3 [IU] via SUBCUTANEOUS
  Administered 2021-01-18 (×2): 2 [IU] via SUBCUTANEOUS
  Administered 2021-01-19 – 2021-01-20 (×2): 3 [IU] via SUBCUTANEOUS
  Administered 2021-01-20: 11 [IU] via SUBCUTANEOUS
  Administered 2021-01-20: 3 [IU] via SUBCUTANEOUS
  Administered 2021-01-21 (×3): 5 [IU] via SUBCUTANEOUS
  Administered 2021-01-22: 3 [IU] via SUBCUTANEOUS
  Administered 2021-01-22: 2 [IU] via SUBCUTANEOUS
  Administered 2021-01-23 (×2): 3 [IU] via SUBCUTANEOUS
  Administered 2021-01-24: 2 [IU] via SUBCUTANEOUS
  Administered 2021-01-24 – 2021-01-25 (×2): 3 [IU] via SUBCUTANEOUS
  Administered 2021-01-25: 2 [IU] via SUBCUTANEOUS

## 2021-01-11 MED ORDER — POTASSIUM CHLORIDE CRYS ER 20 MEQ PO TBCR
40.0000 meq | EXTENDED_RELEASE_TABLET | ORAL | Status: AC
  Administered 2021-01-11 (×2): 40 meq via ORAL
  Filled 2021-01-11: qty 2

## 2021-01-11 MED ORDER — GABAPENTIN 300 MG PO CAPS
300.0000 mg | ORAL_CAPSULE | Freq: Every day | ORAL | Status: DC
  Administered 2021-01-11 – 2021-01-24 (×14): 300 mg via ORAL
  Filled 2021-01-11 (×8): qty 1
  Filled 2021-01-11: qty 3
  Filled 2021-01-11 (×5): qty 1

## 2021-01-11 MED ORDER — HEPARIN SODIUM (PORCINE) 5000 UNIT/ML IJ SOLN
5000.0000 [IU] | Freq: Three times a day (TID) | INTRAMUSCULAR | Status: DC
  Administered 2021-01-11 – 2021-01-16 (×17): 5000 [IU] via SUBCUTANEOUS
  Filled 2021-01-11 (×16): qty 1

## 2021-01-11 MED ORDER — POTASSIUM CHLORIDE CRYS ER 20 MEQ PO TBCR
40.0000 meq | EXTENDED_RELEASE_TABLET | Freq: Two times a day (BID) | ORAL | Status: AC
  Administered 2021-01-11: 40 meq via ORAL
  Filled 2021-01-11 (×2): qty 2

## 2021-01-11 MED ORDER — POTASSIUM CHLORIDE CRYS ER 20 MEQ PO TBCR
40.0000 meq | EXTENDED_RELEASE_TABLET | Freq: Once | ORAL | Status: DC
  Filled 2021-01-11: qty 2

## 2021-01-11 MED ORDER — HYDRALAZINE HCL 25 MG PO TABS
25.0000 mg | ORAL_TABLET | Freq: Three times a day (TID) | ORAL | Status: DC
  Administered 2021-01-11 – 2021-01-16 (×13): 25 mg via ORAL
  Filled 2021-01-11 (×16): qty 1

## 2021-01-11 MED ORDER — SENNOSIDES-DOCUSATE SODIUM 8.6-50 MG PO TABS
1.0000 | ORAL_TABLET | Freq: Every evening | ORAL | Status: DC | PRN
  Administered 2021-01-11 – 2021-01-16 (×2): 1 via ORAL
  Filled 2021-01-11 (×2): qty 1

## 2021-01-11 NOTE — ED Provider Notes (Signed)
Emergency Medicine Provider Triage Evaluation Note  [name] , a 70 y.o. male  was evaluated in triage.  Pt complains of worsening swelling to bilateral thighs for the past week.  He states he feels fluid overloaded.  He states he has been compliant with his Lasix..  Chart review he takes 80 mg Lasix twice daily.  He also complains of some shortness of breath.  Denies any chest pain.   Review of Systems  Positive: + leg swelling, SOB Negative: - chest pain, abdominal distention  Physical Exam  BP (!) 149/91 (BP Location: Left Arm)   Pulse 85   Temp 99 F (37.2 C) (Oral)   Resp 20   SpO2 94%  Gen:   Awake, no distress   Resp:  Normal effort  MSK:   Moves extremities without difficulty  Other:  Bilateral BKAs  Medical Decision Making  Medically screening exam initiated at 8:50 AM.  Appropriate orders placed.  Shivam L Armendariz was informed that the remainder of the evaluation will be completed by another provider, this initial triage assessment does not replace that evaluation, and the importance of remaining in the ED until their evaluation is complete.     Eustaquio Maize, PA-C 01/11/21 [place], [place], DO 01/11/21 [phone]

## 2021-01-11 NOTE — ED Provider Notes (Signed)
[place] EMERGENCY DEPARTMENT Provider Note   CSN: [id number] Arrival date & time: 01/11/21  2585     History Chief Complaint  Patient presents with   Leg Swelling    More[place] is a 70 y.o. male.  Patient is a 70 year old male who presents with shortness of breath.  He has a history of hypertension, prior stroke, diabetes, CHF, chronic kidney disease.  He reports a 1 week history of some progressive shortness of breath and swelling of his lower extremities.  He says his legs feel tight and swollen.  He denies any fevers.  No coughing.  No chest pain or discomfort.  He takes Lasix 80 mg twice daily and says he has been compliant with his medications.  He does not currently have a cardiologist.      Past Medical History:  Diagnosis Date   CHF (congestive heart failure) ([place])    Chronic Kidney Disease IV    Exertional shortness of breath    "before my last OR; I'm fine now" (09/26/2012)   GERD (gastroesophageal reflux disease)    High cholesterol    Hx of seasonal allergies    Hypertension    Myocardial infarction [facility])    Scrotal edema 01/30/2019   Stroke ([place]) 2009   memory loss   Type II diabetes mellitus [facility])     Patient Active Problem List   Diagnosis Date Noted   Acute on chronic heart failure ([place]) 01/11/2021   Phantom limb pain ([place]) 01/08/2021   History of diabetes mellitus 06/26/2020   Need for pneumococcal vaccination 06/26/2020   Healthcare maintenance 01/29/2019   CKD (chronic kidney disease), stage IV ([place]) 27/78/2423   Chronic systolic heart failure ([place]) 10/24/2012   Major depressive disorder, recurrent episode, moderate ([place]) 10/05/2012   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   H/O: CVA (cerebrovascular accident) 07/27/2012   Hypertension 07/27/2012   Normocytic anemia 07/27/2012    Past Surgical History:  Procedure Laterality Date   AMPUTATION Right 07/29/2012   Procedure: AMPUTATION 3RD TOE;  Surgeon: Kerin Salen, MD;  Location: [facility];  Service: Orthopedics;  Laterality: Right;  right third toe amputation   AMPUTATION Right 08/02/2012   Procedure: right transmetatarsal amputation;  Surgeon: Kerin Salen, MD;  Location: [place];  Service: Orthopedics;  Laterality: Right;   AMPUTATION Right 09/06/2012   Procedure: AMPUTATION BELOW KNEE;  Surgeon: Kerin Salen, MD;  Location: [place];  Service: Orthopedics;  Laterality: Right;   AMPUTATION Left 09/27/2012   Procedure: AMPUTATION BELOW KNEE;  Surgeon: Kerin Salen, MD;  Location: [place];  Service: Orthopedics;  Laterality: Left;   CATARACT EXTRACTION W/ INTRAOCULAR LENS  IMPLANT, BILATERAL  ?2010   CORONARY ARTERY BYPASS GRAFT  2008   CABG X4   ESOPHAGOGASTRODUODENOSCOPY N/A 10/08/2012   Procedure: ESOPHAGOGASTRODUODENOSCOPY (EGD);  Surgeon: Winfield Cunas., MD;  Location: [facility] ENDOSCOPY;  Service: Endoscopy;  Laterality: N/A;   PERIPHERALLY INSERTED CENTRAL CATHETER INSERTION Right 09/2012   upper arm       No family history on file.  Social History   Tobacco Use   Smoking status: Former    Packs/day: 0.12    Years: 20.00    Pack years: 2.40    Types: Cigarettes   Smokeless tobacco: Never  Vaping Use   Vaping Use: Never used  Substance Use Topics   Alcohol use: No    Comment: last drink 2010   Drug use: No  Home Medications Prior to Admission medications   Medication Sig Start Date End Date Taking? Authorizing Provider  aspirin EC 81 MG tablet Take 81 mg by mouth every morning.   Yes [provider]  atorvastatin (LIPITOR) 40 MG tablet Take 1 tablet (40 mg total) by mouth every morning. Patient taking differently: Take 40 mg by mouth daily. 08/23/18  Yes Mariel Aloe, MD  Cyanocobalamin (VITAMIN B-12 PO) Take 1 tablet by mouth daily.   Yes [provider]  furosemide (LASIX) 80 MG tablet Take 1 tablet (80 mg total) by mouth 2 (two) times daily. 04/17/19 01/11/21 Yes Al Decant, MD  gabapentin (NEURONTIN) 300 MG capsule Take 1 capsule  (300 mg total) by mouth at bedtime. 01/08/21  Yes Katsadouros, Vasilios, MD  hydrALAZINE (APRESOLINE) 25 MG tablet TAKE 1 TABLET BY MOUTH THREE TIMES A DAY Patient taking differently: Take 25 mg by mouth 3 (three) times daily. 12/26/20  Yes Virl Axe, MD  isosorbide mononitrate (IMDUR) 30 MG 24 hr tablet Take 1 tablet (30 mg total) by mouth daily. 06/26/20  Yes [name], MD  metoprolol succinate (TOPROL-XL) 50 MG 24 hr tablet Take 50 mg by mouth every morning. 05/31/18  Yes [provider]  nitroGLYCERIN (NITROSTAT) 0.4 MG SL tablet Place 0.4 mg under the tongue every 5 (five) minutes as needed for chest pain.   Yes [provider]  pantoprazole (PROTONIX) 40 MG tablet Take 1 tablet (40 mg total) by mouth daily at 6 (six) AM. 12/21/18  Yes Charolette Forward, MD  sodium bicarbonate 650 MG tablet Take 650 mg by mouth 2 (two) times daily. 10/31/20  Yes [provider]  sodium zirconium cyclosilicate (LOKELMA) 10 g PACK packet Take 10 g by mouth 2 (two) times daily.   Yes [provider]  tamsulosin (FLOMAX) 0.4 MG CAPS capsule Take 0.4 mg by mouth every morning. 06/11/18  Yes [provider]    Allergies    Patient has no known allergies.  Review of Systems   Review of Systems  Constitutional:  Positive for fatigue. Negative for chills, diaphoresis and fever.  HENT:  Negative for congestion, rhinorrhea and sneezing.   Eyes: Negative.   Respiratory:  Positive for shortness of breath. Negative for cough and chest tightness.   Cardiovascular:  Positive for leg swelling. Negative for chest pain.  Gastrointestinal:  Negative for abdominal pain, blood in stool, diarrhea, nausea and vomiting.  Genitourinary:  Negative for difficulty urinating, flank pain, frequency and hematuria.  Musculoskeletal:  Negative for arthralgias and back pain.  Skin:  Negative for rash.  Neurological:  Negative for dizziness, speech difficulty, weakness, numbness and headaches.    Physical Exam Updated Vital Signs BP (!) 147/96   Pulse 78   Temp 99 F (37.2 C) (Oral)   Resp (!) 21   SpO2 96%   Physical Exam Constitutional:      Appearance: He is well-developed.  HENT:     Head: Normocephalic and atraumatic.  Eyes:     Pupils: Pupils are equal, round, and reactive to light.  Cardiovascular:     Rate and Rhythm: Normal rate and regular rhythm.     Heart sounds: Normal heart sounds.  Pulmonary:     Effort: Pulmonary effort is normal. No respiratory distress.     Breath sounds: Rales present. No wheezing.     Comments: Few crackles in the bases Chest:     Chest wall: No tenderness.  Abdominal:     General:  Bowel sounds are normal.     Palpations: Abdomen is soft.     Tenderness: There is no abdominal tenderness. There is no guarding or rebound.  Musculoskeletal:        General: Normal range of motion.     Cervical back: Normal range of motion and neck supple.     Comments: Bilateral lower extremity amputations  Lymphadenopathy:     Cervical: No cervical adenopathy.  Skin:    General: Skin is warm and dry.     Findings: No rash.  Neurological:     Mental Status: He is alert and oriented to person, place, and time.    ED Results / Procedures / Treatments   Labs (all labs ordered are listed, but only abnormal results are displayed) Labs Reviewed  COMPREHENSIVE METABOLIC PANEL - Abnormal; Notable for the following components:      Result Value   Potassium 2.5 (*)    Glucose, Bld 191 (*)    BUN 40 (*)    Creatinine, Ser 4.50 (*)    Calcium 8.4 (*)    Albumin 3.4 (*)    AST 46 (*)    GFR, Estimated 13 (*)    All other components within normal limits  CBC WITH DIFFERENTIAL/PLATELET - Abnormal; Notable for the following components:   RBC 3.35 (*)    Hemoglobin 9.4 (*)    HCT 30.8 (*)    RDW 15.6 (*)    All other components within normal limits  BRAIN NATRIURETIC PEPTIDE - Abnormal; Notable for the following components:   B Natriuretic  Peptide >4,500.0 (*)    All other components within normal limits  TROPONIN I (HIGH SENSITIVITY) - Abnormal; Notable for the following components:   Troponin I (High Sensitivity) 207 (*)    All other components within normal limits  TROPONIN I (HIGH SENSITIVITY) - Abnormal; Notable for the following components:   Troponin I (High Sensitivity) 218 (*)    All other components within normal limits  RESP PANEL BY RT-PCR (FLU A&B, COVID) ARPGX2  MAGNESIUM  PHOSPHORUS  URINALYSIS, COMPLETE (UACMP) WITH MICROSCOPIC  SODIUM, URINE, RANDOM  POTASSIUM    EKG EKG Interpretation  Date/Time:  Sunday January 11 2021 12:57:21 EST Ventricular Rate:  78 PR Interval:  228 QRS Duration: 201 QT Interval:  484 QTC Calculation: 552 R Axis:   130 Text Interpretation: Sinus rhythm Ventricular tachycardia, unsustained Prolonged PR interval Right bundle branch block Confirmed by Malvin Johns [phone]) on 01/11/2021 1:38:21 PM  Radiology DG Chest 2 View  Result Date: 01/11/2021 CLINICAL DATA:  Worsening thigh swelling. EXAM: CHEST - 2 VIEW COMPARISON:  January 30, 2019 FINDINGS: Postsurgical changes from CABG. Enlarged cardiac silhouette. Calcific atherosclerotic disease and tortuosity of the aorta. Streaky bilateral pulmonary opacities may represent scarring given their stability from prior radiographs. No definite pulmonary edema. Osseous structures are without acute abnormality. Soft tissues are grossly normal. IMPRESSION: 1. Streaky bilateral pulmonary opacities may represent scarring given their stability from prior radiographs. 2. Enlarged cardiac silhouette. Electronically Signed   By: Fidela Salisbury M.D.   On: 01/11/2021 09:44    Procedures Procedures   Medications Ordered in ED Medications  furosemide (LASIX) injection 80 mg (has no administration in time range)  heparin injection 5,000 Units (has no administration in time range)  acetaminophen (TYLENOL) tablet 650 mg (has no  administration in time range)    Or  acetaminophen (TYLENOL) suppository 650 mg (has no administration in time range)  senna-docusate (Senokot-S) tablet 1 tablet (  has no administration in time range)  aspirin EC tablet 81 mg (has no administration in time range)  atorvastatin (LIPITOR) tablet 40 mg (has no administration in time range)  gabapentin (NEURONTIN) capsule 300 mg (has no administration in time range)  hydrALAZINE (APRESOLINE) tablet 25 mg (has no administration in time range)  isosorbide mononitrate (IMDUR) 24 hr tablet 30 mg (has no administration in time range)  pantoprazole (PROTONIX) EC tablet 40 mg (has no administration in time range)  tamsulosin (FLOMAX) capsule 0.4 mg (has no administration in time range)  potassium chloride SA (KLOR-CON) CR tablet 40 mEq (has no administration in time range)  insulin aspart (novoLOG) injection 0-15 Units (has no administration in time range)  potassium chloride 10 mEq in 100 mL IVPB (has no administration in time range)    ED Course  I have reviewed the triage vital signs and the nursing notes.  Pertinent labs & imaging results that were available during my care of the patient were reviewed by me and considered in my medical decision making (see chart for details).    MDM Rules/Calculators/A&P                           Patient is a 70 year old male who presents with shortness of breath.  No associated chest pain.  Chest x-ray shows some patchiness in the bases.  His BNP is markedly elevated with clinical suggestions of fluid overload.  He was given a dose of IV Lasix.  His EKG does not show any ischemic changes.  Repeat EKG shows a lot of ectopy with multiple PVCs and short nonsustained runs of V. tach.  His potassium is low at 2.5.  We will start potassium replacement.  His creatinine is elevated, little bit more than his baseline but he has known chronic kidney disease.  His troponin is elevated.  Second 1 was just slightly elevated  over the first.  I discussed the case with Dr. Terrence Dupont who has seen the patient in the past.  He will come in and consult on the patient.  I also discussed the patient with the internal medicine teaching service who will admit the patient.  CRITICAL CARE Performed by: Malvin Johns Total critical care time: 70 minutes Critical care time was exclusive of separately billable procedures and treating other patients. Critical care was necessary to treat or prevent imminent or life-threatening deterioration. Critical care was time spent personally by me on the following activities: development of treatment plan with patient and/or surrogate as well as nursing, discussions with consultants, evaluation of patient's response to treatment, examination of patient, obtaining history from patient or surrogate, ordering and performing treatments and interventions, ordering and review of laboratory studies, ordering and review of radiographic studies, pulse oximetry and re-evaluation of patient's condition.  Final Clinical Impression(s) / ED Diagnoses Final diagnoses:  Acute on chronic congestive heart failure, unspecified heart failure type ([place])  Chronic kidney disease, unspecified CKD stage  Hypokalemia  Ventricular tachycardia, non-sustained  Acute kidney injury [facility])    Rx / DC Orders ED Discharge Orders     None        Malvin Johns, MD 01/11/21 1523

## 2021-01-11 NOTE — Hospital Course (Addendum)
  Acute on chronic HFrEF Sustained V-tach Patient presented with 1 week of shortness of breath and bilateral lower extremity edema. Last echo in 2020 with EF of 35-40%, mild LVH, global LV hypokinesis without regional wall motion abnormalities, and indeterminate diastolic dysfunction. BNP elevated >4,500. HF exacerbation likely due to worsening CKD and decreased urine output. He had minimal urine output with IV Lasix 80 mg. He was later started on HD for volume removal.   On hospital day 1, patient had EKG with wide complex tachycardia, concerning for sustained vtach likely in the setting of electrolyte abnormalities and metabolic acidosis. He was started on Amiodarone push/infusion and transferred to the ICU. IMTS resumed care of this patient on 11/11. He was transitioned to amiodarone po. Dosage was decrease to 200 mg daily given hypotension. His home blood pressure medications including hydralazine, metoprolol and imdur were held due to hypotension.   ESRD on HD Patient notes marked decrease of urine output on admission. He last saw his nephrologist in March of 2022, and noted that he adamantly does not want to be put on dialysis. He has also been following regularly with palliative care. During his course in ICU, patient agreed to start dialysis and had AV graft placed. Patient has been accepted to [facility] on TTS schedule.   #CVA  #Possible seizure Patient had a short code blue called on him on hospital day 11 (11/17) as he vomited and became unresponsive, although he never lost a pulse. Possibly had seizure like activity, and was incontinent of his stool. CPR was not performed, and the patient become responsive after about 3 minutes. His respiratory arrest was thought to be secondary to seizure like activity. MRI brain showed a small acute infarct in his left pons and a focus of acute ischemia in the right corona radiata, with multiple old infarcts noted, as well. Patient has  not been noted to be in Afib, however, we are unable to see telemetry from yesterday during the episode. 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 MRA with no large vessel occlusion, although motion degraded regarding evaluation of intracranial ICA.no seizure captured on EEG.  Neurology was consulted and recommended outpatient follow up with Cardiology for cardiac monitor. He was started on DAPT and atorvastatin.   #Diabetes A1c is likely falsely low in the setting of ESRD on HD. Patient did not require much insulin during his hospitalization. Will start patient on Rybelsus (semaglutide) 3 mg daily x 1 month. Likely will need this increased, and will need further titration of his diabetes regimen with his PCP.

## 2021-01-11 NOTE — ED Triage Notes (Signed)
Pt reports swelling to bilateral thighs over the past week.  Denies SOB and chest pain.  Taking Lasix twice per day as prescribed.

## 2021-01-11 NOTE — ED Notes (Signed)
Admitting providers at bedside

## 2021-01-11 NOTE — H&P (Addendum)
Date: 01/11/2021               Patient Name:  [name] MRN: [id number]  DOB: [birth year] Age / Sex: 70 y.o., male   PCP: [name], MD         Medical Service: Internal Medicine Teaching Service         Attending Physician: Dr. Velna Ochs, MD    First Contact: Dr. Buddy Duty Pager: [phone]  Second Contact: Dr. Gaylan Gerold Pager: [phone]       After Hours (After 5p/  First Contact Pager: [phone]  weekends / holidays): Second Contact Pager: [phone]   Chief Complaint: bilateral lower extremity swelling, SOB  History of Present Illness: [name] is a 70 yo male with  HTN, T2DM, previous CVA, s/p CABG x 4, HLD, CKD IV, HFrEF (last EF 35-40% in 2020), and bilateral BKAs who presents to [facility] with worsening bilateral lower extremity edema in his thighs and shortness of breath that started about 1 week ago. He started to get short of breath with exertion starting 1 week ago with no real identifying trigger noted, and noticed that he has been more fatigued over this time period. He was seen at the [facility] on 11/3 for pain in his lower extremities, but his swelling was not as bad at this time. Starting today, his dyspnea on exertion progressed to shortness of breath at rest, and he is even short of breath while conversing. He denies any dietary changes and has been adherent with his medications, specifically with taking his lasix 80 mg twice daily. He denies chest pain, palpitations, orthopnea, and paroxysmal nocturnal dyspnea. The patient is unsure of his dry weight, but states that he is usually around 202 lbs, although he does not weight himself frequently. He also denies any recent illness, fevers, chills, dizziness, lightheadedness, headache, abd pain, n/v/d, constipation, or dysuria. He does endorse a decrease in urinary frequency and notes that his urine is slowing down over the last few days. He follows with his nephrologist regularly, and last was at their  office 1 week ago.   Meds:  Hydralazine 25 mg tid Aspirin 81 mg Lipitor 40 mg Lasix 80 mg bid Gabapentin 300 mg qhs Imdur 30 mg  Metoprolol 50 mg  Nitro prn Protonix 40 mg  Lokelma 10 g every other day Flomax 0.4 mg No outpatient medications have been marked as taking for the 01/11/21 encounter [facility] Encounter).     Allergies: Allergies as of 01/11/2021   (No Known Allergies)   Past Medical History:  Diagnosis Date   CHF (congestive heart failure) (HCC)    Chronic Kidney Disease IV    Exertional shortness of breath    "before my last OR; I'm fine now" (09/26/2012)   GERD (gastroesophageal reflux disease)    High cholesterol    Hx of seasonal allergies    Hypertension    Myocardial infarction [facility])    Scrotal edema 01/30/2019   Stroke [facility]) 2009   memory loss   Type II diabetes mellitus ([place])     Family History: Cardiovascular disease  Social History: Lives at home with his ex. Independent of his ADLs. Remote history of tobacco use, quite 25 years ago. No alcohol or any other drug use.   Review of Systems: A complete ROS was negative except as per HPI.   Physical Exam: Blood pressure (!) 147/96, pulse 78, temperature 99 F (37.2 C), temperature source Oral, resp. rate Marland Kitchen)  21, SpO2 96 %. General: Elderly-appearing male laying in bed. No acute distress. Head: Normocephalic. Atraumatic. CV: RRR. No murmurs. 2-3+ pitting edema in thighs Pulmonary: Normal effort on room air. Crackles appreciated in lung bases.  Abdominal: Soft, nontender, nondistended. Normal bowel sounds. Extremities: S/p bilateral BKAs (has prosthetics)  Skin: Warm and dry. No obvious rash or lesions. Neuro: A&Ox3. No focal deficit. Psych: Normal mood and affect  EKG: personally reviewed my interpretation is RBBB (old) with PVCs. Possible Bifascicular block (RBBB + RAD). Prolonged PR interval on initial EKG consistent with type I AV block, so possible trifascicular block   CXR: personally  reviewed my interpretation is streaky pulmonary opacities bilaterally   Assessment & Plan by Problem: Active Problems:   Acute on chronic heart failure (HCC)  #Acute on chronic HFrEF #Ischemic cardiomyopathy s/p CABG x 4 #Elevated troponin likely 2/2 to demand ischemia Patient presented with 1 week of shortness of breath and bilateral lower extremity edema. Denies any dietary changes and has been adherent with all of his medications. Last echo in 2020 with EF of 35-40%, mild LVH, global LV hypokinesis without regional wall motion abnormalities, and indeterminate diastolic dysfunction. BNP elevated today to >4,500 and troponin elevated at 207, with delta trop also elevated at 218. BNP elevation and HF exacerbation could be secondary to worsening CKD and decreased urine output vs cardiorenal syndrome. Patient was given one dose of IV lasix 80 mg in the ED, unsure of urine output.  - Repeat echo pending - Strict I&Os and daily weights - Holding home BB  - Follow up Mg and BMP - Likely will require further diuresis   #Multifocal PVCs #Possible bifascicular block #Hypokalemia Patient noted to have multifocal PVCs on EKG. He denies any chest pain or palpitations at this time. PVCs could possibly be secondary to hypokalemia as K was noted to be 2.5 upon admission. Cardiology consulted from the ED. - Cardiology consulted, appreciate recommendations - Repeat EKG pending - PO Kclor 40 mEq x2 and IV Kcl 10 mEq x 2 - Follow up Mg and repeat K at 10 pm  #AKI on CKD IV Baseline Cr appears to be around 2.75-3.0, elevated to 4.5 today with GFR of 13. Patient notes that he has been urinating less over the past few days, although he denies any dysuria or hematuria. He does not have any difficulty initiating his urinary stream, but has just had a decrease in urine output. He last saw his nephrologist in March of 2022, and noted that he adamantly does not want to be put on dialysis. He has also been following  regularly with palliative care. - Renal ultrasound - Urinalysis and urine sodium pending - Follow up Mg, phos, and BMP - Avoid nephrotoxic agents  #Type 2 diabetes A1c was 6.0 in April. Does not take any medications for this at home. - Follow up A1c - Monitor CBG  #Hypertension Systolic blood pressure on 140-150s on admission. On Imdur 30 mg daily, hydralazine 25 mg tid, lasix 80 mg daily, and metoprolol 50 mg daily. - Resumed home imdur 30 mg and hydralazine 25 mg tid - IV diuresis as above - Holding home beta blocker for now  #CAD s/p CABG #Hyperlipidemia Resumed home lipitor 40 mg and ASA 81 mg  #BPH Resumed home flomax 0.4 mg   Best practices: Code: Full VTE: Heparin  Diet: Heart Healthy IVF: None Dispo: Admit patient to Inpatient with expected length of stay greater than 2 midnights.  Signed: Dorethea Clan, DO  01/11/2021, 2:40 PM  Pager: [phone] After 5pm on weekdays and 1pm on weekends: On Call pager: [phone]

## 2021-01-11 NOTE — Consult Note (Signed)
Reason for Consult: Acute on chronic decompensated systolic heart failure/minimally elevated high-sensitivity troponin I Referring Physician: Teaching service  [name] is an 70 y.o. male.  HPI: Patient is 70 year old male with past medical history significant for multiple medical problems i.e. coronary artery disease history of MI x2 last MI in June 2020 treated medically status post CABG in remote past, ischemic dilated cardiomyopathy, history of congestive heart failure secondary to depressed LV systolic function, valvular heart disease, hypertension, diabetes mellitus, history of CVA, peripheral vascular disease status post bilateral below-knee amputation, hyperlipidemia, chronic kidney disease stage IV, anemia of chronic disease, GERD, noncompliant to follow-up not seen in the office for more than 1 year came to ER complaining of progressive thigh swelling associated with shortness of breath for last 1 week states has gained approximately 24 pounds in last 2 months.  Denies any chest pain.  Denies noncompliance to medications denies excessive salty food intake.  Denies fever chills cough sore throat denies any urinary complaints.  Patient denies any palpitations lightheadedness or syncope noted to have frequent PVCs and couplets on the monitor and was noted to have potassium of 2.5 which is being replaced.  Patient again states he wants it to be treated medically for his CAD and chronic kidney disease and do not want dialysis at all.  Past Medical History:  Diagnosis Date   CHF (congestive heart failure) (HCC)    Chronic Kidney Disease IV    Exertional shortness of breath    "before my last OR; I'm fine now" (09/26/2012)   GERD (gastroesophageal reflux disease)    High cholesterol    Hx of seasonal allergies    Hypertension    Myocardial infarction [facility])    Scrotal edema 01/30/2019   Stroke ([place]) 2009   memory loss   Type II diabetes mellitus ([place])     Past Surgical History:   Procedure Laterality Date   AMPUTATION Right 07/29/2012   Procedure: AMPUTATION 3RD TOE;  Surgeon: Kerin Salen, MD;  Location: [place];  Service: Orthopedics;  Laterality: Right;  right third toe amputation   AMPUTATION Right 08/02/2012   Procedure: right transmetatarsal amputation;  Surgeon: Kerin Salen, MD;  Location: [place];  Service: Orthopedics;  Laterality: Right;   AMPUTATION Right 09/06/2012   Procedure: AMPUTATION BELOW KNEE;  Surgeon: Kerin Salen, MD;  Location: [place];  Service: Orthopedics;  Laterality: Right;   AMPUTATION Left 09/27/2012   Procedure: AMPUTATION BELOW KNEE;  Surgeon: Kerin Salen, MD;  Location: [place];  Service: Orthopedics;  Laterality: Left;   CATARACT EXTRACTION W/ INTRAOCULAR LENS  IMPLANT, BILATERAL  ?2010   CORONARY ARTERY BYPASS GRAFT  2008   CABG X4   ESOPHAGOGASTRODUODENOSCOPY N/A 10/08/2012   Procedure: ESOPHAGOGASTRODUODENOSCOPY (EGD);  Surgeon: Winfield Cunas., MD;  Location: [facility];  Service: Endoscopy;  Laterality: N/A;   PERIPHERALLY INSERTED CENTRAL CATHETER INSERTION Right 09/2012   upper arm    No family history on file.  Social History:  reports that he has quit smoking. His smoking use included cigarettes. He has a 2.40 pack-year smoking history. He has never used smokeless tobacco. He reports that he does not drink alcohol and does not use drugs.  Allergies: No Known Allergies  Medications: I have reviewed the patient's current medications.  Results for orders placed or performed during the hospital encounter of 01/11/21 (from the past 48 hour(s))  Comprehensive metabolic panel     Status: Abnormal   Collection Time: 01/11/21  8:51 AM  Result Value Ref Range   Sodium 137 135 - 145 mmol/L   Potassium 2.5 (LL) 3.5 - 5.1 mmol/L    Comment: CRITICAL RESULT CALLED TO, READ BACK BY AND VERIFIED WITH: J.BLUE RN @ 5621 01/11/2021 BY C.EDENS    Chloride 98 98 - 111 mmol/L   CO2 25 22 - 32 mmol/L   Glucose, Bld 191 (H) 70 - 99 mg/dL     Comment: Glucose reference range applies only to samples taken after fasting for at least 8 hours.   BUN 40 (H) 8 - 23 mg/dL   Creatinine, Ser 4.50 (H) 0.61 - 1.24 mg/dL   Calcium 8.4 (L) 8.9 - 10.3 mg/dL   Total Protein 6.5 6.5 - 8.1 g/dL   Albumin 3.4 (L) 3.5 - 5.0 g/dL   AST 46 (H) 15 - 41 U/L   ALT 38 0 - 44 U/L   Alkaline Phosphatase 85 38 - 126 U/L   Total Bilirubin 1.1 0.3 - 1.2 mg/dL   GFR, Estimated 13 (L) >60 mL/min    Comment: (NOTE) Calculated using the CKD-EPI Creatinine Equation (2021)    Anion gap 14 5 - 15    Comment: Performed at [facility], [place] [address], [place], [place] [address]  CBC with Differential     Status: Abnormal   Collection Time: 01/11/21  8:51 AM  Result Value Ref Range   WBC 6.0 4.0 - 10.5 K/uL   RBC 3.35 (L) 4.22 - 5.81 MIL/uL   Hemoglobin 9.4 (L) 13.0 - 17.0 g/dL   HCT 30.8 (L) 39.0 - 52.0 %   MCV 91.9 80.0 - 100.0 fL   MCH 28.1 26.0 - 34.0 pg   MCHC 30.5 30.0 - 36.0 g/dL   RDW 15.6 (H) 11.5 - 15.5 %   Platelets 175 150 - 400 K/uL   nRBC 0.0 0.0 - 0.2 %   Neutrophils Relative % 59 %   Neutro Abs 3.6 1.7 - 7.7 K/uL   Lymphocytes Relative 25 %   Lymphs Abs 1.5 0.7 - 4.0 K/uL   Monocytes Relative 11 %   Monocytes Absolute 0.7 0.1 - 1.0 K/uL   Eosinophils Relative 4 %   Eosinophils Absolute 0.2 0.0 - 0.5 K/uL   Basophils Relative 1 %   Basophils Absolute 0.0 0.0 - 0.1 K/uL   Immature Granulocytes 0 %   Abs Immature Granulocytes 0.02 0.00 - 0.07 K/uL    Comment: Performed at [facility] [address], [place], [place] [address]  Brain natriuretic peptide     Status: Abnormal   Collection Time: 01/11/21  8:51 AM  Result Value Ref Range   B Natriuretic Peptide >4,500.0 (H) 0.0 - 100.0 pg/mL    Comment: Performed at [facility] [address] [place], [place] [address]  Troponin I (High Sensitivity)     Status: Abnormal   Collection Time: 01/11/21  8:51 AM  Result Value Ref Range   Troponin I (High  Sensitivity) 207 (HH) <18 ng/L    Comment: CRITICAL RESULT CALLED TO, READ BACK BY AND VERIFIED WITH: J.BLUE RN @ 8413 01/11/2021 BY C.EDENS (NOTE) Elevated high sensitivity troponin I (hsTnI) values and significant  changes across serial measurements may suggest ACS but many other  chronic and acute conditions are known to elevate hsTnI results.  Refer to the Links section for chest pain algorithms and additional  guidance. Performed at [facility], [place] [address] [place], [place]  [address]   Troponin I (High Sensitivity)     Status: Abnormal   Collection Time: 01/11/21 11:40 AM  Result Value Ref Range   Troponin I (High Sensitivity) 218 (HH) <18 ng/L    Comment: CRITICAL VALUE NOTED.  VALUE IS CONSISTENT WITH PREVIOUSLY REPORTED AND CALLED VALUE. (NOTE) Elevated high sensitivity troponin I (hsTnI) values and significant  changes across serial measurements may suggest ACS but many other  chronic and acute conditions are known to elevate hsTnI results.  Refer to the Links section for chest pain algorithms and additional  guidance. Performed at [facility], [place] [address], [place], [place] [address]     DG Chest 2 View  Result Date: 01/11/2021 CLINICAL DATA:  Worsening thigh swelling. EXAM: CHEST - 2 VIEW COMPARISON:  January 30, 2019 FINDINGS: Postsurgical changes from CABG. Enlarged cardiac silhouette. Calcific atherosclerotic disease and tortuosity of the aorta. Streaky bilateral pulmonary opacities may represent scarring given their stability from prior radiographs. No definite pulmonary edema. Osseous structures are without acute abnormality. Soft tissues are grossly normal. IMPRESSION: 1. Streaky bilateral pulmonary opacities may represent scarring given their stability from prior radiographs. 2. Enlarged cardiac silhouette. Electronically Signed   By: Fidela Salisbury M.D.   On: 01/11/2021 09:44   US RENAL  Result Date: 01/11/2021 CLINICAL DATA:  Acute renal  insufficiency EXAM: RENAL / URINARY TRACT ULTRASOUND COMPLETE COMPARISON:  August 20, 2018 FINDINGS: Right Kidney: Renal measurements: 9.4 x 4.3 x 5.9 cm = volume: 124 mL. Mildly increased echogenicity. No mass or hydronephrosis visualized. Left Kidney: Renal measurements: 8.8 x 4.3 x 3.8 cm = volume: 74 mL. Mildly increased echogenicity. No mass or hydronephrosis visualized. Bladder: Appears normal for degree of bladder distention. Other: Heterogeneous appearance of the prostate gland and questionable enlargement. IMPRESSION: Bilateral increased echogenicity of the kidneys, consistent with medical renal disease. Abnormal appearance of the prostate gland. Please correlate to PSA values. Electronically Signed   By: Fidela Salisbury M.D.   On: 01/11/2021 15:31    Review of Systems  Constitutional:  Positive for fatigue. Negative for diaphoresis and fever.  HENT:  Negative for sore throat.   Eyes:  Negative for discharge.  Respiratory:  Positive for shortness of breath. Negative for chest tightness.   Cardiovascular:  Positive for leg swelling. Negative for chest pain.  Gastrointestinal:  Positive for abdominal distention. Negative for abdominal pain.  Genitourinary:  Negative for difficulty urinating and frequency.  Neurological:  Negative for dizziness.  Blood pressure 127/79, pulse 85, temperature 99 F (37.2 C), temperature source Oral, resp. rate 19, SpO2 99 %. Physical Exam Constitutional:      Appearance: Normal appearance.  HENT:     Head: Normocephalic and atraumatic.  Eyes:     Conjunctiva/sclera: Conjunctivae normal.  Cardiovascular:     Rate and Rhythm: Normal rate and regular rhythm.     Heart sounds: Murmur (2/6 systolic murmur and S3 gallop noted) heard.  Abdominal:     General: There is distension.     Palpations: Abdomen is soft.     Tenderness: There is no abdominal tenderness. There is no guarding.  Musculoskeletal:     Cervical back: Normal range of motion and neck  supple.     Comments: Bilateral below-knee amputation with swelling of thighs noted  Skin:    General: Skin is warm and dry.  Neurological:     Mental Status: He is alert.    Assessment/Plan: Acute on chronic decompensated systolic congestive heart failure Mildly elevated  high-sensitivity troponin high the setting of acute congestive heart failure/chronic kidney disease doubt significant MI Coronary artery disease history of MI x2 in the past status post CABG in remote past Ischemic cardiomyopathy Valvular heart disease Hypertension Diabetes mellitus Peripheral vascular disease status post bilateral below-knee amputation Chronic kidney disease stage IV GERD History of CVA in the past Hypokalemia Anemia of chronic disease Plan Check serial enzymes and EKG Add clopidogrel 75 mg daily Replace K Check lipid panel, hemoglobin A1c, mag and repeat K Discussed with various options of treatment and wants medical management only.  Also reluctant for hemodialysis in the future also. Charolette Forward 01/11/2021, 3:46 PM

## 2021-01-11 NOTE — ED Notes (Signed)
Critical labs Potassium   2.5 Troponin     207  Reported to MD

## 2021-01-11 NOTE — Progress Notes (Signed)
  Echocardiogram 2D Echocardiogram has been performed.  [name] 01/11/2021, 4:56 PM

## 2021-01-12 DIAGNOSIS — N179 Acute kidney failure, unspecified: Secondary | ICD-10-CM

## 2021-01-12 DIAGNOSIS — J9601 Acute respiratory failure with hypoxia: Secondary | ICD-10-CM | POA: Diagnosis not present

## 2021-01-12 DIAGNOSIS — I213 ST elevation (STEMI) myocardial infarction of unspecified site: Secondary | ICD-10-CM

## 2021-01-12 DIAGNOSIS — I5023 Acute on chronic systolic (congestive) heart failure: Secondary | ICD-10-CM

## 2021-01-12 LAB — POCT I-STAT 7, (LYTES, BLD GAS, ICA,H+H)
Acid-base deficit: 6 mmol/L — ABNORMAL HIGH (ref 0.0–2.0)
Bicarbonate: 19.9 mmol/L — ABNORMAL LOW (ref 20.0–28.0)
Calcium, Ion: 1.08 mmol/L — ABNORMAL LOW (ref 1.15–1.40)
HCT: 35 % — ABNORMAL LOW (ref 39.0–52.0)
Hemoglobin: 11.9 g/dL — ABNORMAL LOW (ref 13.0–17.0)
O2 Saturation: 99 %
Patient temperature: 98.6
Potassium: 3.2 mmol/L — ABNORMAL LOW (ref 3.5–5.1)
Sodium: 140 mmol/L (ref 135–145)
TCO2: 21 mmol/L — ABNORMAL LOW (ref 22–32)
pCO2 arterial: 39 mmHg (ref 32.0–48.0)
pH, Arterial: 7.315 — ABNORMAL LOW (ref 7.350–7.450)
pO2, Arterial: 163 mmHg — ABNORMAL HIGH (ref 83.0–108.0)

## 2021-01-12 LAB — GLUCOSE, CAPILLARY
Glucose-Capillary: 217 mg/dL — ABNORMAL HIGH (ref 70–99)
Glucose-Capillary: 142 mg/dL — ABNORMAL HIGH (ref 70–99)
Glucose-Capillary: 151 mg/dL — ABNORMAL HIGH (ref 70–99)
Glucose-Capillary: 167 mg/dL — ABNORMAL HIGH (ref 70–99)
Glucose-Capillary: 149 mg/dL — ABNORMAL HIGH (ref 70–99)

## 2021-01-12 LAB — BLOOD GAS, VENOUS
Acid-base deficit: 5 mmol/L — ABNORMAL HIGH (ref 0.0–2.0)
Bicarbonate: 21.3 mmol/L (ref 20.0–28.0)
FIO2: 100
O2 Saturation: 50.7 %
Patient temperature: 37
pCO2, Ven: 52.5 mmHg (ref 44.0–60.0)
pH, Ven: 7.232 — ABNORMAL LOW (ref 7.250–7.430)
pO2, Ven: 36.3 mmHg (ref 32.0–45.0)

## 2021-01-12 LAB — BASIC METABOLIC PANEL
Anion gap: 14 (ref 5–15)
Anion gap: 17 — ABNORMAL HIGH (ref 5–15)
BUN: 42 mg/dL — ABNORMAL HIGH (ref 8–23)
BUN: 45 mg/dL — ABNORMAL HIGH (ref 8–23)
CO2: 22 mmol/L (ref 22–32)
CO2: 21 mmol/L — ABNORMAL LOW (ref 22–32)
Calcium: 8.8 mg/dL — ABNORMAL LOW (ref 8.9–10.3)
Calcium: 8.8 mg/dL — ABNORMAL LOW (ref 8.9–10.3)
Chloride: 102 mmol/L (ref 98–111)
Chloride: 102 mmol/L (ref 98–111)
Creatinine, Ser: 4.36 mg/dL — ABNORMAL HIGH (ref 0.61–1.24)
Creatinine, Ser: 4.83 mg/dL — ABNORMAL HIGH (ref 0.61–1.24)
GFR, Estimated: 14 mL/min — ABNORMAL LOW (ref >60)
GFR, Estimated: 12 mL/min — ABNORMAL LOW (ref >60)
Glucose, Bld: 218 mg/dL — ABNORMAL HIGH (ref 70–99)
Glucose, Bld: 168 mg/dL — ABNORMAL HIGH (ref 70–99)
Potassium: 3.6 mmol/L (ref 3.5–5.1)
Potassium: 3.9 mmol/L (ref 3.5–5.1)
Sodium: 138 mmol/L (ref 135–145)
Sodium: 140 mmol/L (ref 135–145)

## 2021-01-12 LAB — LIPID PANEL
Cholesterol: 138 mg/dL (ref 0–200)
HDL: 32 mg/dL — ABNORMAL LOW (ref >40)
LDL Cholesterol: 94 mg/dL (ref 0–99)
Total CHOL/HDL Ratio: 4.3 ratio
Triglycerides: 61 mg/dL (ref <150)
VLDL: 12 mg/dL (ref 0–40)

## 2021-01-12 LAB — HIV ANTIBODY (ROUTINE TESTING W REFLEX): HIV Screen 4th Generation wRfx: NONREACTIVE

## 2021-01-12 LAB — HEMOGLOBIN A1C
Hgb A1c MFr Bld: 6.8 % — ABNORMAL HIGH (ref 4.8–5.6)
Mean Plasma Glucose: 148.46 mg/dL

## 2021-01-12 LAB — CBC
HCT: 31.1 % — ABNORMAL LOW (ref 39.0–52.0)
Hemoglobin: 9.7 g/dL — ABNORMAL LOW (ref 13.0–17.0)
MCH: 28.8 pg (ref 26.0–34.0)
MCHC: 31.2 g/dL (ref 30.0–36.0)
MCV: 92.3 fL (ref 80.0–100.0)
Platelets: 184 10*3/uL (ref 150–400)
RBC: 3.37 MIL/uL — ABNORMAL LOW (ref 4.22–5.81)
RDW: 15.9 % — ABNORMAL HIGH (ref 11.5–15.5)
WBC: 6 10*3/uL (ref 4.0–10.5)
nRBC: 0.3 % — ABNORMAL HIGH (ref 0.0–0.2)

## 2021-01-12 LAB — ECHOCARDIOGRAM COMPLETE
MV M vel: 4.94 m/s
MV Peak grad: 97.6 mmHg
S' Lateral: 5.5 cm

## 2021-01-12 LAB — PSA: Prostatic Specific Antigen: 1.22 ng/mL (ref 0.00–4.00)

## 2021-01-12 LAB — MAGNESIUM: Magnesium: 2.6 mg/dL — ABNORMAL HIGH (ref 1.7–2.4)

## 2021-01-12 LAB — TROPONIN I (HIGH SENSITIVITY)
Troponin I (High Sensitivity): 131 ng/L (ref <18)
Troponin I (High Sensitivity): 141 ng/L (ref <18)

## 2021-01-12 LAB — PHOSPHORUS: Phosphorus: 4.9 mg/dL — ABNORMAL HIGH (ref 2.5–4.6)

## 2021-01-12 MED ORDER — LORAZEPAM 2 MG/ML IJ SOLN
INTRAMUSCULAR | Status: AC
  Filled 2021-01-12: qty 1

## 2021-01-12 MED ORDER — AMIODARONE LOAD VIA INFUSION: 150.0000 mg | Freq: Once | INTRAVENOUS | Status: DC

## 2021-01-12 MED ORDER — CHLORHEXIDINE GLUCONATE CLOTH 2 % EX PADS
6.0000 | MEDICATED_PAD | Freq: Every day | CUTANEOUS | Status: DC
  Administered 2021-01-12 – 2021-01-14 (×2): 6 via TOPICAL

## 2021-01-12 MED ORDER — LORAZEPAM 2 MG/ML IJ SOLN
0.5000 mg | Freq: Once | INTRAMUSCULAR | Status: AC | PRN
  Administered 2021-01-12: 0.5 mg via INTRAVENOUS
  Filled 2021-01-12: qty 1

## 2021-01-12 MED ORDER — AMIODARONE HCL IN DEXTROSE 360-4.14 MG/200ML-% IV SOLN
INTRAVENOUS | Status: AC
  Filled 2021-01-12: qty 200

## 2021-01-12 MED ORDER — METOLAZONE 5 MG PO TABS
5.0000 mg | ORAL_TABLET | Freq: Once | ORAL | Status: DC
  Filled 2021-01-12: qty 1

## 2021-01-12 MED ORDER — ONDANSETRON HCL 4 MG PO TABS
4.0000 mg | ORAL_TABLET | Freq: Once | ORAL | Status: AC | PRN
  Administered 2021-01-12: 4 mg via ORAL
  Filled 2021-01-12: qty 1

## 2021-01-12 MED ORDER — AMIODARONE LOAD VIA INFUSION
150.0000 mg | Freq: Once | INTRAVENOUS | Status: AC
  Administered 2021-01-12: 150 mg via INTRAVENOUS
  Filled 2021-01-12: qty 83.34

## 2021-01-12 MED ORDER — CALCIUM GLUCONATE-NACL 1-0.675 GM/50ML-% IV SOLN
1.0000 g | Freq: Once | INTRAVENOUS | Status: AC
  Administered 2021-01-12: 1000 mg via INTRAVENOUS
  Filled 2021-01-12: qty 50

## 2021-01-12 MED ORDER — AMIODARONE HCL 200 MG PO TABS
200.0000 mg | ORAL_TABLET | Freq: Every day | ORAL | Status: DC
Start: 2021-01-12 — End: 2021-01-13
  Administered 2021-01-12 – 2021-01-13 (×2): 200 mg via ORAL
  Filled 2021-01-12 (×2): qty 1

## 2021-01-12 MED ORDER — AMIODARONE HCL IN DEXTROSE 360-4.14 MG/200ML-% IV SOLN
30.0000 mg/h | INTRAVENOUS | Status: DC
  Administered 2021-01-12: 30 mg/h via INTRAVENOUS
  Filled 2021-01-12 (×3): qty 200

## 2021-01-12 MED ORDER — FUROSEMIDE 10 MG/ML IJ SOLN
80.0000 mg | Freq: Two times a day (BID) | INTRAMUSCULAR | Status: DC
  Administered 2021-01-12 – 2021-01-13 (×2): 80 mg via INTRAVENOUS
  Filled 2021-01-12 (×2): qty 8

## 2021-01-12 MED ORDER — HYDROMORPHONE HCL 1 MG/ML IJ SOLN
0.5000 mg | Freq: Once | INTRAMUSCULAR | Status: AC
  Administered 2021-01-12: 0.5 mg via INTRAVENOUS
  Filled 2021-01-12: qty 0.5

## 2021-01-12 MED ORDER — AMIODARONE HCL IN DEXTROSE 360-4.14 MG/200ML-% IV SOLN
60.0000 mg/h | INTRAVENOUS | Status: DC
  Administered 2021-01-12 (×2): 60 mg/h via INTRAVENOUS
  Filled 2021-01-12: qty 200

## 2021-01-12 MED ORDER — GABAPENTIN 300 MG PO CAPS
300.0000 mg | ORAL_CAPSULE | Freq: Once | ORAL | Status: AC
  Administered 2021-01-12: 300 mg via ORAL
  Filled 2021-01-12: qty 1

## 2021-01-12 MED ORDER — METOPROLOL SUCCINATE ER 50 MG PO TB24
50.0000 mg | ORAL_TABLET | Freq: Every morning | ORAL | Status: DC
  Administered 2021-01-13 – 2021-01-21 (×9): 50 mg via ORAL
  Filled 2021-01-12 (×9): qty 1

## 2021-01-12 NOTE — Progress Notes (Signed)
Pt c/o phantom pain to right BKA stump. Prn tylenol given pt but reported it was not going to work for his pain of 10 on a 0-10 scale. MD on-call notified and new orders received. Will continue to closely monitor. Delia Heady RN

## 2021-01-12 NOTE — Progress Notes (Addendum)
HD#1 SUBJECTIVE:  Patient Summary: [name] is a 70 y.o. with a pertinent PMH of HFrEF (Ef 35-40%), HTN, T2DM, previous CVA, s/p CABG x4, HLD, and CKD IV who presented with shortness of breath and admitted for HFrEF exacerbation.   Overnight Events: No acute events overnight.  Interim History: This is hospital day 1 for [name] who was seen and evaluated at the bedside this morning. He is still not producing much urine. Feels weak due to vomiting and still feels nauseous.  Denies tremors, confusion, metallic taste. The patient states that his breathing is improved. Also endorses insomnia and fatigue.   OBJECTIVE:  Vital Signs: Vitals:   01/11/21 1857 01/11/21 2026 01/12/21 0059 01/12/21 0100  BP: 130/74 128/61 (!) 142/93   Pulse:  84 71   Resp:  18 16   Temp: 99 F (37.2 C) 97.9 F (36.6 C) 98.6 F (37 C)   TempSrc: Oral Oral Oral   SpO2: 95% 98% 99%   Weight: 98.3 kg   99.5 kg  Height: 5\' 8"  (1.727 m)      Supplemental O2: Room Air SpO2: 99 %  Filed Weights   01/11/21 1857 01/12/21 0100  Weight: 98.3 kg 99.5 kg     Intake/Output Summary (Last 24 hours) at 01/12/2021 7846 Last data filed at 01/12/2021 0222 Gross per 24 hour  Intake 865.95 ml  Output 100 ml  Net 765.95 ml   Net IO Since Admission: 765.95 mL [01/12/21 0623]  Physical Exam: General: Elderly-appearing male laying in bed. No acute distress. Head: Normocephalic. Atraumatic. CV: RRR. Systolic murmur. Bilateral thighs still edematous, but improved.  Pulmonary: Normal effort on room air. Decreased breath sounds in lung bases.  Abdominal: Soft, nontender, nondistended. Normal bowel sounds. Extremities: S/p bilateral BKAs (has prosthetics)  Skin: Warm and dry. No obvious rash or lesions. Neuro: A&Ox3. No focal deficit. Psych: Normal mood and affect   ASSESSMENT/PLAN:  Assessment: Active Problems:   Acute on chronic heart failure (HCC) Discussed we would add some medication for vomiting.  Discussed irregular heart beats, low potassium on admission. Kidney function has worsened. Adamant he will not do dialysis. Discussed kidney failure can lead to death. Patient understands and is aware of the consequences of not being on dialysis.   Plan: #Acute on chronic HFrEF #Ischemic cardiomyopathy s/p CABG x 4 #Elevated troponin likely 2/2 to demand ischemia Patient presented with 1 week of shortness of breath and bilateral lower extremity edema.  BNP elevation and HF exacerbation could be secondary to worsening CKD and decreased urine output vs cardiorenal syndrome. Echo with EF of 30-35% (previously 35-40%), moderate to severely decreased LV function and LV global hypokinesis. He was given 1 dose of IV lasix yesterday with suboptimal response. He appears to be in oliuric renal failure.  - Appreciate cardiology & nephrology's recommendations regarding diuretics in the setting of worsening renal failure  - Strict I&Os and daily weights - Resumed home metoprolol 50 mg daily  - Follow up BMP   #Multifocal PVCs #RBBB with AV block  #Hypokalemia, resolved Patient noted to have multifocal PVCs on EKG. He denies any chest pain or palpitations at this time. PVCs could possibly be secondary to hypokalemia as K was noted to be 2.5 upon admission, improved to 3.6 this morning after K repletion.  - Cardiology consulted, appreciate recommendations - Start low dose amiodarone 200 mg daily  - Replete electrolytes as needed   #AKI on CKD IV #Oliguric Renal Failure Baseline Cr appears  to be around 2.75-3.0, elevated to 4.5 today on admission and 4.3 today. Patient has not been producing much urine and was only noted to have 100 cc urine output in the last 24 hrs with only 60 cc seen on bladder scan.Marland Kitchen He does not have any difficulty initiating his urinary stream, but has just had a decrease in urine output. He last saw his nephrologist in March of 2022, and noted that he adamantly does not want to be put  on dialysis. Renal U/S showed bilateral increased echogenicity of the kidneys consistent with medical renal disease and an abnormal appearance of the prostate. PSA within normal limits. Patient is consistent in his decision not to pursue dialysis and understands if renal function dose not improve, this could take his life. Recommend palliative consult if no improvement in renal function.  - Nephrology consulted, appreciate recommendation  - Lasix per nephro and cards   - Follow up BMP - Avoid nephrotoxic agents   #Type 2 diabetes A1c was 6.0 in April, 6.8 today. Does not take any medications for this at home. - Monitor CBG   #Hypertension Systolic blood pressure on 140-150s on admission,a nd have been normotensive today. On Imdur 30 mg daily, hydralazine 25 mg tid, lasix 80 mg daily, and metoprolol 50 mg daily. - Continue home imdur 30 mg and hydralazine 25 mg tid - Resumed metoprolol 50 mg daily   Best Practice: Diet: Cardiac diet IVF: Fluids: none VTE: heparin injection 5,000 Units Start: 01/11/21 1430 Code: Full AB: None Therapy Recs: Pending DISPO: Anticipated discharge to Home pending Medical stability.  Signature: Buddy Duty, D.O.  Internal Medicine Resident, PGY-1 [facility]  Pager: [phone] 6:23 AM, 01/12/2021   Please contact the on call pager after 5 pm and on weekends at [phone].

## 2021-01-12 NOTE — Consult Note (Signed)
NAME:  [name], MRN:  [id number], DOB:  [birth year], LOS: 1 ADMISSION DATE:  01/11/2021, CONSULTATION DATE:  01/12/2021 REFERRING MD:  Gaylan Gerold, DO , CHIEF COMPLAINT:  SOB/Hypoxia   History of Present Illness:  70 y o M with HTN, DM2, s/p b/l BKA, CAD s/p CABG, CKD stage IV and HFrEF who was admitted with acute on chronic systolic CHF and AKI on CKD4 after he presented with SOB and b/l leg swelling. Today patient started with non-sustained V.tach, cardiology was consulted who recommend amiodarone bolus followed by infusion, patient became SOB, requiring NRB mask, so PCCM was consulted to help with management. Of note patient's kidney function is getting worse and he is oliguric, seen by nephrology but he has been refusing HD.  Pertinent  Medical History   Past Medical History:  Diagnosis Date   CHF (congestive heart failure) (HCC)    Chronic Kidney Disease IV    Exertional shortness of breath    "before my last OR; I'm fine now" (09/26/2012)   GERD (gastroesophageal reflux disease)    High cholesterol    Hx of seasonal allergies    Hypertension    Myocardial infarction [facility])    Scrotal edema 01/30/2019   Stroke [facility]) 2009   memory loss   Type II diabetes mellitus ([place])      Significant Hospital Events: Including procedures, antibiotic start and stop dates in addition to other pertinent events     Interim History / Subjective:  Patient was transferred to ICU   Objective   Blood pressure 116/65, pulse 72, temperature 98.6 F (37 C), temperature source Oral, resp. rate (!) 22, height 5\' 8"  (1.727 m), weight 99.5 kg, SpO2 97 %.        Intake/Output Summary (Last 24 hours) at 01/12/2021 1601 Last data filed at 01/12/2021 5701 Gross per 24 hour  Intake 1065.95 ml  Output 100 ml  Net 965.95 ml   Filed Weights   01/11/21 1857 01/12/21 0100  Weight: 98.3 kg 99.5 kg    Examination:   Physical exam: General: Crtitically ill-appearing elderly male, on NRB  mask HEENT: [place]/AT, eyes anicteric.  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 Acute encephalopathy likely due to uremia   Continue aggressive diuretic with lasix and metolazone Monitor I/O Renal is following, unfortunately patient has been refusing for HD, this makes situation tricky as he doesn't make much urine and he is heart failure Palliative care consult is requested Continue amiodarone  Cardiology is on board Monitor and supplement electrolytes Trend trops Monitor FS with goal 140 - 180 Continue to titrate oxygen with O2 sat goal >92% Avoid sedation Delirium precautions   Best Practice (right click and "Reselect all SmartList Selections" daily)   Diet/type: NPO DVT prophylaxis: SCD GI prophylaxis: PPI Lines: N/A Foley:  Yes, and it is still needed Code Status:  full code Last date of multidisciplinary goals of care discussion [Pending]  Labs   CBC: Recent Labs  Lab 01/11/21 0851 01/12/21 0602 01/12/21 1513  WBC  6.0 6.0  --   NEUTROABS 3.6  --   --   HGB 9.4* 9.7* 11.9*  HCT 30.8* 31.1* 35.0*  MCV 91.9 92.3  --   PLT 175 184  --     Basic Metabolic Panel: Recent Labs  Lab 01/11/21 0851 01/11/21 1311 01/11/21 1905 01/12/21 0602 01/12/21 1513  NA 137  --   --  138 140  K 2.5*  --  3.1* 3.6 3.2*  CL 98  --   --  102  --   CO2 25  --   --  22  --   GLUCOSE 191*  --   --  218*  --   BUN 40*  --   --  42*  --    CREATININE 4.50*  --   --  4.36*  --   CALCIUM 8.4*  --   --  8.8*  --   MG  --  2.5*  --   --   --   PHOS  --   --  3.6  --   --    GFR: Estimated Creatinine Clearance: 18 mL/min (A) (by C-G formula based on SCr of 4.36 mg/dL (H)). Recent Labs  Lab 01/11/21 0851 01/12/21 0602  WBC 6.0 6.0    Liver Function Tests: Recent Labs  Lab 01/11/21 0851  AST 46*  ALT 38  ALKPHOS 85  BILITOT 1.1  PROT 6.5  ALBUMIN 3.4*   No results for input(s): LIPASE, AMYLASE in the last 168 hours. No results for input(s): AMMONIA in the last 168 hours.  ABG    Component Value Date/Time   PHART 7.315 (L) 01/12/2021 1513   PCO2ART 39.0 01/12/2021 1513   PO2ART 163 (H) 01/12/2021 1513   HCO3 19.9 (L) 01/12/2021 1513   TCO2 21 (L) 01/12/2021 1513   ACIDBASEDEF 6.0 (H) 01/12/2021 1513   O2SAT 99.0 01/12/2021 1513     Coagulation Profile: No results for input(s): INR, PROTIME in the last 168 hours.  Cardiac Enzymes: No results for input(s): CKTOTAL, CKMB, CKMBINDEX, TROPONINI in the last 168 hours.  HbA1C: Hemoglobin A1C  Date/Time Value Ref Range Status  06/26/2020 09:27 AM 6.0 (A) 4.0 - 5.6 % Final  07/31/2019 02:09 PM 5.2 4.0 - 5.6 % Final   Hgb A1c MFr Bld  Date/Time Value Ref Range Status  01/12/2021 06:02 AM 6.8 (H) 4.8 - 5.6 % Final    Comment:    (NOTE) Pre diabetes:          5.7%-6.4%  Diabetes:              >6.4%  Glycemic control for   <7.0% adults with diabetes   12/18/2018 11:11 PM 5.4 4.8 - 5.6 % Final    Comment:    (NOTE) Pre diabetes:          5.7%-6.4% Diabetes:              >6.4% Glycemic control for   <7.0% adults with diabetes     CBG: Recent Labs  Lab 01/11/21 2143 01/12/21 0605 01/12/21 1118 01/12/21 1316 01/12/21 1500  GLUCAP 177* 217* 142* 151* 167*    Review of Systems:   Unable to obtain as patient is encephalopathic  Past Medical History:  He,  has a past medical history of CHF (congestive heart failure) ([place]), Chronic Kidney  Disease IV, Exertional shortness of breath, GERD (gastroesophageal reflux disease), High cholesterol, seasonal allergies, Hypertension, Myocardial infarction [facility]), Scrotal edema (01/30/2019), Stroke ([place]) (2009),  and Type II diabetes mellitus ([place]).   Surgical History:   Past Surgical History:  Procedure Laterality Date   AMPUTATION Right 07/29/2012   Procedure: AMPUTATION 3RD TOE;  Surgeon: Kerin Salen, MD;  Location: [place];  Service: Orthopedics;  Laterality: Right;  right third toe amputation   AMPUTATION Right 08/02/2012   Procedure: right transmetatarsal amputation;  Surgeon: Kerin Salen, MD;  Location: [place];  Service: Orthopedics;  Laterality: Right;   AMPUTATION Right 09/06/2012   Procedure: AMPUTATION BELOW KNEE;  Surgeon: Kerin Salen, MD;  Location: [place];  Service: Orthopedics;  Laterality: Right;   AMPUTATION Left 09/27/2012   Procedure: AMPUTATION BELOW KNEE;  Surgeon: Kerin Salen, MD;  Location: [place];  Service: Orthopedics;  Laterality: Left;   CATARACT EXTRACTION W/ INTRAOCULAR LENS  IMPLANT, BILATERAL  ?2010   CORONARY ARTERY BYPASS GRAFT  2008   CABG X4   ESOPHAGOGASTRODUODENOSCOPY N/A 10/08/2012   Procedure: ESOPHAGOGASTRODUODENOSCOPY (EGD);  Surgeon: Winfield Cunas., MD;  Location[facility] ENDOSCOPY;  Service: Endoscopy;  Laterality: N/A;   PERIPHERALLY INSERTED CENTRAL CATHETER INSERTION Right 09/2012   upper arm     Social History:   reports that he has quit smoking. His smoking use included cigarettes. He has a 2.40 pack-year smoking history. He has never used smokeless tobacco. He reports that he does not drink alcohol and does not use drugs.   Family History:  His family history is not on file.   Allergies No Known Allergies   Home Medications  Prior to Admission medications   Medication Sig Start Date End Date Taking? Authorizing Provider  aspirin EC 81 MG tablet Take 81 mg by mouth every morning.   Yes [provider]  atorvastatin (LIPITOR) 40  MG tablet Take 1 tablet (40 mg total) by mouth every morning. Patient taking differently: Take 40 mg by mouth daily. 08/23/18  Yes Mariel Aloe, MD  Cyanocobalamin (VITAMIN B-12 PO) Take 1 tablet by mouth daily.   Yes [provider]  furosemide (LASIX) 80 MG tablet Take 1 tablet (80 mg total) by mouth 2 (two) times daily. 04/17/19 01/11/21 Yes Al Decant, MD  gabapentin (NEURONTIN) 300 MG capsule Take 1 capsule (300 mg total) by mouth at bedtime. 01/08/21  Yes Katsadouros, Vasilios, MD  hydrALAZINE (APRESOLINE) 25 MG tablet TAKE 1 TABLET BY MOUTH THREE TIMES A DAY Patient taking differently: Take 25 mg by mouth 3 (three) times daily. 12/26/20  Yes Virl Axe, MD  isosorbide mononitrate (IMDUR) 30 MG 24 hr tablet Take 1 tablet (30 mg total) by mouth daily. 06/26/20  Yes [name], MD  metoprolol succinate (TOPROL-XL) 50 MG 24 hr tablet Take 50 mg by mouth every morning. 05/31/18  Yes [provider]  nitroGLYCERIN (NITROSTAT) 0.4 MG SL tablet Place 0.4 mg under the tongue every 5 (five) minutes as needed for chest pain.   Yes [provider]  pantoprazole (PROTONIX) 40 MG tablet Take 1 tablet (40 mg total) by mouth daily at 6 (six) AM. 12/21/18  Yes Charolette Forward, MD  sodium bicarbonate 650 MG tablet Take 650 mg by mouth 2 (two) times daily. 10/31/20  Yes [provider]  sodium zirconium cyclosilicate (LOKELMA) 10 g PACK packet Take 10 g by mouth 2 (two) times daily.   Yes [provider]  tamsulosin (FLOMAX) 0.4 MG CAPS capsule Take 0.4 mg by mouth every morning. 06/11/18  Yes [provider]     Critical care time:  Total critical care time: 51 minutes  Performed by: [place] care time was exclusive of separately billable procedures and treating other patients.   Critical care was necessary to treat or prevent imminent or life-threatening deterioration.   Critical care was time spent personally by me on the  following activities: development of treatment plan with patient and/or surrogate as well as nursing, discussions with consultants, evaluation of patient's response to treatment, examination of patient, obtaining history from patient or surrogate, ordering and performing treatments and interventions, ordering and review of laboratory studies, ordering and review of radiographic studies, pulse oximetry and re-evaluation of patient's condition.   Jacky Kindle MD [place] [facility] See Amion for pager If no response to pager, please call [phone] until 7pm After 7pm, Please call E-link [phone]

## 2021-01-12 NOTE — Significant Event (Signed)
Rapid Response Event Note   Reason for Call :  Critical EKG  Initial Focused Assessment:  Patient is lying in the bed.  He endorses significant fatigue.  He denies chest patient.   Lung sounds diminished mild crackles Heart tones irregular Nausea and vomiting  AF with BBB and PVCs and short runs of VT  158/114  HR 110-20s  RR 16  O2 sat difficult to obtain. 98% on NRB  Interventions:  150 mg Amiodarone bolus and gtt started Placed on NRB  Cardiology and CCM consulted  Transported to 2H22 via bed with zoll monitor  Plan of Care:     Event Summary:   MD Notified: Gaylan Gerold at bedside Call [place] Time:  [place] Time: [place]  Raliegh Ip, RN

## 2021-01-12 NOTE — Progress Notes (Signed)
Subjective:  Patient denies any chest pain states breathing has improved denies any palpitations.  Noted to have nonsustained VT frequent PVCs and couplets despite correcting electrolytes.  Objective:  Vital Signs in the last 24 hours: Temp:  [97.9 F (36.6 C)-99 F (37.2 C)] 97.9 F (36.6 C) (11/07 1121) Pulse Rate:  [71-89] 89 (11/07 1121) Resp:  [14-22] 16 (11/07 1121) BP: (125-158)/(61-96) 128/77 (11/07 1121) SpO2:  [91 %-99 %] 95 % (11/07 1121) Weight:  [98.3 kg-99.5 kg] 99.5 kg (11/07 0100)  Intake/Output from previous day: 11/06 0701 - 11/07 0700 In: 866 [P.O.:460; IV Piggyback:406] Out: 100 [Urine:100] Intake/Output from this shift: Total I/O In: 200 [P.O.:200] Out: -   Physical Exam: Neck: no adenopathy, no carotid bruit, no JVD, and supple, symmetrical, trachea midline Lungs: Decreased breath sound at bases Heart: regular rate and rhythm, S1, S2 normal, and 2/6 systolic murmur and S3 gallop noted Abdomen: soft, non-tender; bowel sounds normal; no masses,  no organomegaly Extremities: Bilateral BKA noted thigh swelling slightly improved  Lab Results: Recent Labs    01/11/21 0851 01/12/21 0602  WBC 6.0 6.0  HGB 9.4* 9.7*  PLT 175 184   Recent Labs    01/11/21 0851 01/11/21 1905 01/12/21 0602  NA 137  --  138  K 2.5* 3.1* 3.6  CL 98  --  102  CO2 25  --  22  GLUCOSE 191*  --  218*  BUN 40*  --  42*  CREATININE 4.50*  --  4.36*   No results for input(s): TROPONINI in the last 72 hours.  Invalid input(s): CK, MB Hepatic Function Panel Recent Labs    01/11/21 0851  PROT 6.5  ALBUMIN 3.4*  AST 46*  ALT 38  ALKPHOS 85  BILITOT 1.1   Recent Labs    01/12/21 0602  CHOL 138   No results for input(s): PROTIME in the last 72 hours.  Imaging: DG Chest 2 View  Result Date: 01/11/2021 CLINICAL DATA:  Worsening thigh swelling. EXAM: CHEST - 2 VIEW COMPARISON:  January 30, 2019 FINDINGS: Postsurgical changes from CABG. Enlarged cardiac silhouette.  Calcific atherosclerotic disease and tortuosity of the aorta. Streaky bilateral pulmonary opacities may represent scarring given their stability from prior radiographs. No definite pulmonary edema. Osseous structures are without acute abnormality. Soft tissues are grossly normal. IMPRESSION: 1. Streaky bilateral pulmonary opacities may represent scarring given their stability from prior radiographs. 2. Enlarged cardiac silhouette. Electronically Signed   By: Fidela Salisbury M.D.   On: 01/11/2021 09:44   US RENAL  Result Date: 01/11/2021 CLINICAL DATA:  Acute renal insufficiency EXAM: RENAL / URINARY TRACT ULTRASOUND COMPLETE COMPARISON:  August 20, 2018 FINDINGS: Right Kidney: Renal measurements: 9.4 x 4.3 x 5.9 cm = volume: 124 mL. Mildly increased echogenicity. No mass or hydronephrosis visualized. Left Kidney: Renal measurements: 8.8 x 4.3 x 3.8 cm = volume: 74 mL. Mildly increased echogenicity. No mass or hydronephrosis visualized. Bladder: Appears normal for degree of bladder distention. Other: Heterogeneous appearance of the prostate gland and questionable enlargement. IMPRESSION: Bilateral increased echogenicity of the kidneys, consistent with medical renal disease. Abnormal appearance of the prostate gland. Please correlate to PSA values. Electronically Signed   By: Fidela Salisbury M.D.   On: 01/11/2021 15:31   ECHOCARDIOGRAM COMPLETE  Result Date: 01/12/2021    ECHOCARDIOGRAM REPORT   Patient Name:   [name] [place] Date of Exam: 01/11/2021 Medical Rec #:  [id number]      Height:  68.0 in Accession #:    [id number]     Weight:       229.1 lb Date of Birth:  Sep 13, 1950       BSA:          2.165 m Patient Age:    70 years       BP:           157/85 mmHg Patient Gender: M              HR:           86 bpm. Exam Location:  Inpatient Procedure: 2D Echo Indications:     congestive heart failure  History:         Patient has prior history of Echocardiogram examinations, most                  recent  08/19/2018. Prior CABG, chronic kidney disease; Risk                  Factors:Hypertension and Diabetes.  Sonographer:     Johny Chess RDCS Referring Phys:  [place] Diagnosing Phys: Charolette Forward MD IMPRESSIONS  1. Left ventricular ejection fraction, by estimation, is 30 to 35%. The left ventricle has moderate to severely decreased function. The left ventricle demonstrates global hypokinesis. The left ventricular internal cavity size was mildly dilated. Left ventricular diastolic function could not be evaluated.  2. Right ventricular systolic function is mildly reduced. The right ventricular size is normal. There is moderately elevated pulmonary artery systolic pressure.  3. Left atrial size was mildly dilated.  4. The mitral valve is normal in structure. Moderate mitral valve regurgitation.  5. Tricuspid valve regurgitation is mild to moderate.  6. The aortic valve is normal in structure. Aortic valve regurgitation is not visualized. Mild aortic valve sclerosis is present, with no evidence of aortic valve stenosis.  7. The inferior vena cava is dilated in size with <50% respiratory variability, suggesting right atrial pressure of 15 mmHg. FINDINGS  Left Ventricle: Left ventricular ejection fraction, by estimation, is 30 to 35%. The left ventricle has moderate to severely decreased function. The left ventricle demonstrates global hypokinesis. The left ventricular internal cavity size was mildly dilated. There is no left ventricular hypertrophy. Left ventricular diastolic function could not be evaluated. Right Ventricle: The right ventricular size is normal. No increase in right ventricular wall thickness. Right ventricular systolic function is mildly reduced. There is moderately elevated pulmonary artery systolic pressure. The tricuspid regurgitant velocity is 2.90 m/s, and with an assumed right atrial pressure of 15 mmHg, the estimated right ventricular systolic pressure is 72.5 mmHg. Left Atrium:  Left atrial size was mildly dilated. Right Atrium: Right atrial size was normal in size. Pericardium: There is no evidence of pericardial effusion. Mitral Valve: The mitral valve is normal in structure. Moderate mitral valve regurgitation. Tricuspid Valve: The tricuspid valve is normal in structure. Tricuspid valve regurgitation is mild to moderate. Aortic Valve: The aortic valve is normal in structure. Aortic valve regurgitation is not visualized. Mild aortic valve sclerosis is present, with no evidence of aortic valve stenosis. Pulmonic Valve: The pulmonic valve was normal in structure. Pulmonic valve regurgitation is mild. Aorta: The aortic root is normal in size and structure. Venous: The inferior vena cava is dilated in size with less than 50% respiratory variability, suggesting right atrial pressure of 15 mmHg. IAS/Shunts: No atrial level shunt detected by color flow Doppler.  LEFT VENTRICLE PLAX 2D LVIDd:  6.00 cm LVIDs:         5.50 cm LV PW:         0.80 cm LV IVS:        0.90 cm LVOT diam:     2.10 cm LVOT Area:     3.46 cm  IVC IVC diam: 2.40 cm LEFT ATRIUM              Index        RIGHT ATRIUM           Index LA diam:        4.80 cm  2.22 cm/m   RA Area:     20.30 cm LA Vol (A2C):   100.0 ml 46.19 ml/m  RA Volume:   62.10 ml  28.68 ml/m LA Vol (A4C):   76.8 ml  35.47 ml/m LA Biplane Vol: 89.0 ml  41.10 ml/m   AORTA Ao Root diam: 3.20 cm Ao Asc diam:  3.20 cm MR Peak grad: 97.6 mmHg   TRICUSPID VALVE MR Mean grad: 55.0 mmHg   TV Peak grad:   32.5 mmHg MR Vmax:      494.00 cm/s TV Vmax:        2.85 m/s MR Vmean:     334.0 cm/s  TR Peak grad:   33.6 mmHg                           TR Vmax:        290.00 cm/s                            SHUNTS                           Systemic Diam: 2.10 cm Charolette Forward MD Electronically signed by Charolette Forward MD Signature Date/Time: 01/12/2021/11:45:30 AM    Final     Cardiac Studies:  Assessment/Plan:  Acute on chronic decompensated systolic congestive  heart failure Mildly elevated high-sensitivity troponin high the setting of acute congestive heart failure/chronic kidney disease doubt significant MI Coronary artery disease history of MI x2 in the past status post CABG in remote past Ischemic cardiomyopathy Valvular heart disease Nonsustained VT Hypertension Diabetes mellitus Peripheral vascular disease status post bilateral below-knee amputation Chronic kidney disease stage IV GERD History of CVA in the past Status post hypokalemia Anemia of chronic disease Plan We will start low-dose amiodarone 200 mg daily   LOS: 1 day    Charolette Forward 01/12/2021, 11:50 AM

## 2021-01-12 NOTE — Progress Notes (Signed)
Notified by RN for critical EKG that read STEMI.  When evaluated bedside, patient was lethargic, somnolent but arousable to verbal stimulation.  He denies any chest pain.  States that he is feeling tired.  Also endorses nausea with vomiting likely secondary to uremia.  Patient has rapid heart rate with irregular rhythm.  His systolic blood pressure was in the 140s.  His EKG showed rapid heart rate 127, irregular rhythm, wide QRS complex originally concern for V. tach or V. fib.  We have spoken to Dr. Terrence Dupont on the phone about his EKG and he recommended [place] to call a code STEMI.  Code STEMI was activated and rapid responded was called.  Amiodarone bolus and infusion was started for his wide-complex tachyarrhythmia.  Nitroglycerin was not given with concern of inferior MI.  I was contacted by the STEMI cardiologist on-call and confirmed that this is not a STEMI.  PCCM was consulted and recommended transfer to the ICU for further evaluation.  BiPAP was started but patient cannot tolerate due to ongoing nausea and vomiting.  Patient was then put on nonrebreather.  -STAT troponin, mag, Phos and BMP

## 2021-01-12 NOTE — Progress Notes (Signed)
PT Cancellation Note  Patient Details Name: [name] MRN: [id number] DOB: December 02, 1950   Cancelled Treatment:    Reason Eval/Treat Not Completed: (P) Medical issues which prohibited therapy Pt with Rapid Response and is being transferred to ICU. PT will follow up tomorrow to determine appropriateness of PT Eval.   Cindee Mclester B. Migdalia Dk PT, DPT Acute Rehabilitation Services Pager [phone] Office [phone]    [place] 01/12/2021, 1:59 PM

## 2021-01-12 NOTE — Progress Notes (Signed)
MD removed patient from BIPAP due to potential vomiting. Patient placed on NRB. RT placed bipap in 2H03 where patient is transferring. RT on unit aware. RT will continue to monitor.

## 2021-01-12 NOTE — Progress Notes (Signed)
RN saw some runs of questionable V-tach on the monitor, pt assessed and he denies any discomfort and report relief from ordered pain medication administered to him. Telemetry called to confirm the strip but the telemetry tech states "that was not true V-tach and artifacts that's why I did not call you". Pt resting comfortably in bed with call light within reach and alarm on. Reported off to oncoming RN. Delia Heady RN

## 2021-01-12 NOTE — Progress Notes (Signed)
PT Cancellation Note  Patient Details Name: [name] MRN: [id number] DOB: 23-Sep-1950   Cancelled Treatment:    Reason Eval/Treat Not Completed: (P) Medical issues which prohibited therapy Pt with increased nausea and vomiting. RN to administer Zofran and requests follow back later today. PT will follow back as able.   Trinaty Bundrick B. Migdalia Dk PT, DPT Acute Rehabilitation Services Pager [phone] Office [phone]    [place] 01/12/2021, 10:18 AM

## 2021-01-12 NOTE — Consult Note (Signed)
[facility] Renal Consultation Note  Requesting MD: Velna Ochs, MD Indication for Consultation:  AKI on CKD Stage IV  Chief complaint: "Fluid in my legs"  HPI:  [name] is a 70 y.o. male with PMHx of hypertension, type 2 diabetes s/p b/k BKAs, previous CVA, CAD status post CABG x4, hyperlipidemia, CKD stage IV, HFrEF who was admitted on 11/6 with b/l lower extremity edema and SOB.   He reports that his brother took him to the hospital for worsening swelling of his legs.  He states that he was recently seen by his nephrologist who advised him to double on his Lokelma dose. He has been taking 10 g BID.  He admits to taking all of his medications as prescribed, including his Lasix.  He does not endorse any shortness of breath or orthopnea symptoms. He reports peeing 4-5 times a day regularly. He feels that since he has been in the hospital he has peed less. Has no dysuria or hematuria. He endorses normal appetite but occasionally has nausea and vomiting. He is feeling nauseous this morning. Denies diarrhea. He has not noticed a change in mental status or confusion. He also follows with his cardiologist, Dr. Terrence Dupont. Denies chest pain.   Interval Hx @ 1400: Code STEMI was called due to EKG changes that were concerning for this. Primary team consulted Cardiology who noted these changes were more consistent with non-sustained Vtach. Patient has since transferred to [facility] and started on Amio infusion.    PMHx:   Past Medical History:  Diagnosis Date   CHF (congestive heart failure) ([place])    Chronic Kidney Disease IV    Exertional shortness of breath    "before my last OR; I'm fine now" (09/26/2012)   GERD (gastroesophageal reflux disease)    High cholesterol    Hx of seasonal allergies    Hypertension    Myocardial infarction [facility])    Scrotal edema 01/30/2019   Stroke ([place]) 2009   memory loss   Type II diabetes mellitus ([place])     Past Surgical History:  Procedure  Laterality Date   AMPUTATION Right 07/29/2012   Procedure: AMPUTATION 3RD TOE;  Surgeon: Kerin Salen, MD;  Location: [place];  Service: Orthopedics;  Laterality: Right;  right third toe amputation   AMPUTATION Right 08/02/2012   Procedure: right transmetatarsal amputation;  Surgeon: Kerin Salen, MD;  Location: [place];  Service: Orthopedics;  Laterality: Right;   AMPUTATION Right 09/06/2012   Procedure: AMPUTATION BELOW KNEE;  Surgeon: Kerin Salen, MD;  Location: [place];  Service: Orthopedics;  Laterality: Right;   AMPUTATION Left 09/27/2012   Procedure: AMPUTATION BELOW KNEE;  Surgeon: Kerin Salen, MD;  Location: [place];  Service: Orthopedics;  Laterality: Left;   CATARACT EXTRACTION W/ INTRAOCULAR LENS  IMPLANT, BILATERAL  ?2010   CORONARY ARTERY BYPASS GRAFT  2008   CABG X4   ESOPHAGOGASTRODUODENOSCOPY N/A 10/08/2012   Procedure: ESOPHAGOGASTRODUODENOSCOPY (EGD);  Surgeon: Winfield Cunas., MD;  Location: [facility] ENDOSCOPY;  Service: Endoscopy;  Laterality: N/A;   PERIPHERALLY INSERTED CENTRAL CATHETER INSERTION Right 09/2012   upper arm    Family Hx: No family history on file.  Social History:  reports that he has quit smoking. His smoking use included cigarettes. He has a 2.40 pack-year smoking history. He has never used smokeless tobacco. He reports that he does not drink alcohol and does not use drugs.  Allergies: No Known Allergies  Medications: Prior to Admission medications  Medication Sig Start Date End Date Taking? Authorizing Provider  aspirin EC 81 MG tablet Take 81 mg by mouth every morning.   Yes [provider]  atorvastatin (LIPITOR) 40 MG tablet Take 1 tablet (40 mg total) by mouth every morning. Patient taking differently: Take 40 mg by mouth daily. 08/23/18  Yes Mariel Aloe, MD  Cyanocobalamin (VITAMIN B-12 PO) Take 1 tablet by mouth daily.   Yes [provider]  furosemide (LASIX) 80 MG tablet Take 1 tablet (80 mg total) by mouth 2 (two) times daily.  04/17/19 01/11/21 Yes Al Decant, MD  gabapentin (NEURONTIN) 300 MG capsule Take 1 capsule (300 mg total) by mouth at bedtime. 01/08/21  Yes Katsadouros, Vasilios, MD  hydrALAZINE (APRESOLINE) 25 MG tablet TAKE 1 TABLET BY MOUTH THREE TIMES A DAY Patient taking differently: Take 25 mg by mouth 3 (three) times daily. 12/26/20  Yes Virl Axe, MD  isosorbide mononitrate (IMDUR) 30 MG 24 hr tablet Take 1 tablet (30 mg total) by mouth daily. 06/26/20  Yes [name], MD  metoprolol succinate (TOPROL-XL) 50 MG 24 hr tablet Take 50 mg by mouth every morning. 05/31/18  Yes [provider]  nitroGLYCERIN (NITROSTAT) 0.4 MG SL tablet Place 0.4 mg under the tongue every 5 (five) minutes as needed for chest pain.   Yes [provider]  pantoprazole (PROTONIX) 40 MG tablet Take 1 tablet (40 mg total) by mouth daily at 6 (six) AM. 12/21/18  Yes Charolette Forward, MD  sodium bicarbonate 650 MG tablet Take 650 mg by mouth 2 (two) times daily. 10/31/20  Yes [provider]  sodium zirconium cyclosilicate (LOKELMA) 10 g PACK packet Take 10 g by mouth 2 (two) times daily.   Yes [provider]  tamsulosin (FLOMAX) 0.4 MG CAPS capsule Take 0.4 mg by mouth every morning. 06/11/18  Yes [provider]    I have reviewed the patient's current medications.  Labs:  BMP Latest Ref Rng & Units 01/12/2021 01/11/2021 01/11/2021  Glucose 70 - 99 mg/dL 218(H) - 191(H)  BUN 8 - 23 mg/dL 42(H) - 40(H)  Creatinine 0.61 - 1.24 mg/dL 4.36(H) - 4.50(H)  BUN/Creat Ratio 10 - 24 - - -  Sodium 135 - 145 mmol/L 138 - 137  Potassium 3.5 - 5.1 mmol/L 3.6 3.1(L) 2.5(LL)  Chloride 98 - 111 mmol/L 102 - 98  CO2 22 - 32 mmol/L 22 - 25  Calcium 8.9 - 10.3 mg/dL 8.8(L) - 8.4(L)    Urinalysis    Component Value Date/Time   COLORURINE YELLOW 01/11/2021 [place] 01/11/2021 1428   LABSPEC 1.008 01/11/2021 1428   PHURINE 5.0 01/11/2021 1428   GLUCOSEU NEGATIVE 01/11/2021 1428    HGBUR NEGATIVE 01/11/2021 [place] 01/11/2021 1428   [place] 01/11/2021 1428   PROTEINUR 30 (A) 01/11/2021 1428   UROBILINOGEN 0.2 10/15/2012 1225   NITRITE NEGATIVE 01/11/2021 1428   LEUKOCYTESUR NEGATIVE 01/11/2021 1428   ROS: Pertinent items are noted in HPI.  Physical Exam: Vitals:   01/12/21 0059 01/12/21 0722  BP: (!) 142/93 126/67  Pulse: 71 89  Resp: 16 18  Temp: 98.6 F (37 C) 98 F (36.7 C)  SpO2: 99% 95%     General: Awake, alert but drifts to sleep intermittently, appears stated age, pleasant, cooperative with examination  HEENT: Normocephalic, atraumatic, nares patent Eyes:No scleral icterus  Neck: Supple Heart: RRR, systolic murmur present, no JVD,  Lungs: Normal work of breathing, rales  b/l bases with L>R Abdomen: Obese, non-tender in all quadrants, soft, without R/G Extremities: s/p bilateral BKA, without edema Skin: Warm, dry Neuro: Alert and oriented to person, place and situation but not time- states 2023. Able to follow commands, speech is non-slurred, no focal deficits   Assessment/Plan: [name] is a 70 y.o. male with a history of HTN, T2DM, CVA, CAD s/p CABG x4, HLD, CKD IV, HFrEF and b/l BKA that was admitted for shortness of breath and leg swelling concerning for acute on chronic HF. Nephrology has been consulted for worsening renal function.   AKI on CKD IV: Creatinine 3.52 in August 2021, now worsened at 4.36 with GFR 14.  Baseline Cr appears to be 2.7-3.2. Renal U/S without signs of obstruction but with abnormal appearance of prostate gland. UA positive for hyaline casts and mucus. Follows with Nephrology outpatient. Conversations about dialysis have occurred outpatient and patient is adamant about not wanting this- notes his mother was on dialysis and passed away. No emergent indication for dialysis at this time as there are no severe electrolyte derangements and patient is otherwise hemodynamically stable.  Differential for worsened renal function could be in the setting of worsening CKD vs cardiorenal syndrome.   Acute on chronic HFrEF  CAD, MI x2 s/p CABG x4  HLD:  Cardiology on board, started on Amiodarone. On ASA 81 mg, Plavix 75 mg, and high-intensity statin. Echo EF 30-35%, LV with moderate to severely decreased function, LV with global hypokinesis. CXR without pulmonary edema. BNP >4500 with elevated but down-trending troponin at 207>183. S/p one dose of  80 mg Lasix with only 100 mL recorded UOP. Weight slightly increased 98.3 kg>99.5 kg.   Hypokalemia 2.5 on admission. Patient s/p 152mEq oral and 3mEq IV supplementation. Potassium now WNL at 3.6. Continue to trend with daily BMP and replete as necessary.  Hypertension:  BP normotensive at 126/67 this AM. On imdur 30 mg and hydralazine 25 mg TID. Received 80 mg Lasix x1 without adequate UOP.   BPH:  On Flomax 0.4 mg. PSA WNL.   Type 2 DM:  Well controlled, Hgb A1c 6.8.   [place] [facility] PGY2 01/12/2021, 10:58 AM    Seen and examined independently.  Agree with note and exam as documented above by Dr. Nita Sells and as noted here.  Interval change in status since AM exam - he was moved to the ICU for NSVT cardiology consulted and pulm consulted.  Patient with CKD stage IV and heart failure with reduced EF with shortness of breath.  On lasix 80 mg PO BID at home and on lokelma twice a day at home.  Reviewed outpatient records and he has adamantly expressed to his nephrologist Dr. Candiss Norse on multiple occasions that he would never want dialysis.  He echoed this sentiment today as well.  He was just moved to the ICU on a nonrebreather.  He has been on amio for NSVT.   General adult male in bed on nonrebreather HEENT normocephalic atraumatic extraocular movements intact sclera anicteric Neck supple trachea midline Lungs increased work of breathing initially improves with rest and position change Heart tachycardic  intermittently  Abdomen soft nontender nondistended Extremities sacral edema and thigh edema with bilateral lower extremity edema  Neuro - speaks in short phrases  AKI - may be secondary to cardiorenal. Would never want renal replacement therapy.  Would consult palliative care - lasix 80 mg IV every 12 hours - metolazone 5 mg po once if able to take  CKD stage IV - baseline reported as 2.7-3.2 per last note - normally on lokelma 10 gram BID - can hold for now as K 3.6 and hypokalemic on admit  HFrEF  - diuretics as above  NSVT - on amio; per primary   Acute hypoxic resp failure  - on nonrebreather - diuretics as above  Normocytic anemia  - secondary to ckd - mild - no PRBC indicated  Thank you for the consult.  Please do not hesitate to contact me with any questions   Claudia Desanctis, MD 01/12/2021 3:10 PM

## 2021-01-12 NOTE — Progress Notes (Signed)
This chaplain responded with the medical team to Code Stemi.   The chaplain understands the medical team is assessing the Pt. needs and will F/U with spiritual care as needed.

## 2021-01-13 DIAGNOSIS — Z7189 Other specified counseling: Secondary | ICD-10-CM | POA: Diagnosis not present

## 2021-01-13 DIAGNOSIS — I509 Heart failure, unspecified: Secondary | ICD-10-CM

## 2021-01-13 DIAGNOSIS — N189 Chronic kidney disease, unspecified: Secondary | ICD-10-CM | POA: Diagnosis not present

## 2021-01-13 DIAGNOSIS — I5023 Acute on chronic systolic (congestive) heart failure: Secondary | ICD-10-CM | POA: Diagnosis not present

## 2021-01-13 DIAGNOSIS — N179 Acute kidney failure, unspecified: Secondary | ICD-10-CM | POA: Diagnosis not present

## 2021-01-13 LAB — BASIC METABOLIC PANEL
Anion gap: 25 — ABNORMAL HIGH (ref 5–15)
Anion gap: 16 — ABNORMAL HIGH (ref 5–15)
BUN: 47 mg/dL — ABNORMAL HIGH (ref 8–23)
BUN: 52 mg/dL — ABNORMAL HIGH (ref 8–23)
CO2: 14 mmol/L — ABNORMAL LOW (ref 22–32)
CO2: 22 mmol/L (ref 22–32)
Calcium: 9 mg/dL (ref 8.9–10.3)
Calcium: 9.1 mg/dL (ref 8.9–10.3)
Chloride: 100 mmol/L (ref 98–111)
Chloride: 100 mmol/L (ref 98–111)
Creatinine, Ser: 5.25 mg/dL — ABNORMAL HIGH (ref 0.61–1.24)
Creatinine, Ser: 5.67 mg/dL — ABNORMAL HIGH (ref 0.61–1.24)
GFR, Estimated: 11 mL/min — ABNORMAL LOW (ref >60)
GFR, Estimated: 10 mL/min — ABNORMAL LOW (ref >60)
Glucose, Bld: 189 mg/dL — ABNORMAL HIGH (ref 70–99)
Glucose, Bld: 208 mg/dL — ABNORMAL HIGH (ref 70–99)
Potassium: 4.4 mmol/L (ref 3.5–5.1)
Potassium: 4.2 mmol/L (ref 3.5–5.1)
Sodium: 139 mmol/L (ref 135–145)
Sodium: 138 mmol/L (ref 135–145)

## 2021-01-13 LAB — GLUCOSE, CAPILLARY
Glucose-Capillary: 132 mg/dL — ABNORMAL HIGH (ref 70–99)
Glucose-Capillary: 162 mg/dL — ABNORMAL HIGH (ref 70–99)
Glucose-Capillary: 163 mg/dL — ABNORMAL HIGH (ref 70–99)
Glucose-Capillary: 177 mg/dL — ABNORMAL HIGH (ref 70–99)
Glucose-Capillary: 200 mg/dL — ABNORMAL HIGH (ref 70–99)

## 2021-01-13 LAB — LACTIC ACID, PLASMA: Lactic Acid, Venous: 4.4 mmol/L (ref 0.5–1.9)

## 2021-01-13 LAB — BETA-HYDROXYBUTYRIC ACID: Beta-Hydroxybutyric Acid: 0.28 mmol/L — ABNORMAL HIGH (ref 0.05–0.27)

## 2021-01-13 LAB — MAGNESIUM: Magnesium: 2.6 mg/dL — ABNORMAL HIGH (ref 1.7–2.4)

## 2021-01-13 MED ORDER — AMIODARONE HCL 200 MG PO TABS
200.0000 mg | ORAL_TABLET | Freq: Two times a day (BID) | ORAL | Status: DC
  Administered 2021-01-13 – 2021-01-22 (×18): 200 mg via ORAL
  Filled 2021-01-13 (×18): qty 1

## 2021-01-13 MED ORDER — METOLAZONE 5 MG PO TABS: 5.0000 mg | ORAL_TABLET | Freq: Once | ORAL | Status: DC

## 2021-01-13 MED ORDER — POTASSIUM CHLORIDE CRYS ER 20 MEQ PO TBCR
40.0000 meq | EXTENDED_RELEASE_TABLET | Freq: Every day | ORAL | Status: DC
  Administered 2021-01-13: 40 meq via ORAL
  Filled 2021-01-13: qty 2

## 2021-01-13 MED ORDER — SODIUM CHLORIDE 0.9 % IV SOLN: INTRAVENOUS | Status: DC | PRN

## 2021-01-13 MED ORDER — SODIUM BICARBONATE 650 MG PO TABS
650.0000 mg | ORAL_TABLET | Freq: Two times a day (BID) | ORAL | Status: DC
  Administered 2021-01-13 (×2): 650 mg via ORAL
  Filled 2021-01-13 (×2): qty 1

## 2021-01-13 MED ORDER — METOLAZONE 5 MG PO TABS
10.0000 mg | ORAL_TABLET | Freq: Once | ORAL | Status: AC
  Administered 2021-01-13: 10 mg via ORAL
  Filled 2021-01-13: qty 2

## 2021-01-13 MED ORDER — FUROSEMIDE 10 MG/ML IJ SOLN
120.0000 mg | Freq: Once | INTRAVENOUS | Status: AC
  Administered 2021-01-13: 120 mg via INTRAVENOUS
  Filled 2021-01-13: qty 10

## 2021-01-13 MED ORDER — METOLAZONE 5 MG PO TABS: 10.0000 mg | ORAL_TABLET | Freq: Two times a day (BID) | ORAL | Status: DC

## 2021-01-13 MED ORDER — FUROSEMIDE 10 MG/ML IJ SOLN
11.0000 mg/h | INTRAVENOUS | Status: DC
  Administered 2021-01-13 – 2021-01-15 (×3): 11 mg/h via INTRAVENOUS
  Filled 2021-01-13 (×3): qty 20

## 2021-01-13 NOTE — Progress Notes (Signed)
Subjective:  Patient denies any chest pain or shortness of breath.  States overall feels better nausea, vomiting, resolved.  Noted to have frequent PVCs and occasional nonsustained VT, asymptomatic   Objective:  Vital Signs in the last 24 hours: Temp:  [97.8 F (36.6 C)-99.3 F (37.4 C)] 98.8 F (37.1 C) (11/08 1159) Pulse Rate:  [69-92] 74 (11/08 1100) Resp:  [0-25] 23 (11/08 1100) BP: (71-160)/(50-119) 143/76 (11/08 1100) SpO2:  [87 %-100 %] 100 % (11/08 1100)  Intake/Output from previous day: 11/07 0701 - 11/08 0700 In: 829 [P.O.:200; I.V.:404.8; IV Piggyback:40.3] Out: 250 [Urine:250] Intake/Output from this shift: No intake/output data recorded.  Physical Exam: Neck: no adenopathy, no carotid bruit, no JVD, and supple, symmetrical, trachea midline Lungs: clear anterolaterally Heart: regular rate and rhythm, S1, S2 normal, and 2/6 systolic murmur noted Abdomen: soft, non-tender; bowel sounds normal; no masses,  no organomegaly Extremities: bilateral BKA noted thigh swelling improved  Lab Results: Recent Labs    01/11/21 0851 01/12/21 0602 01/12/21 1513  WBC 6.0 6.0  --   HGB 9.4* 9.7* 11.9*  PLT 175 184  --    Recent Labs    01/12/21 1506 01/12/21 1513 01/13/21 0217  NA 140 140 139  K 3.9 3.2* 4.4  CL 102  --  100  CO2 21*  --  14*  GLUCOSE 168*  --  189*  BUN 45*  --  47*  CREATININE 4.83*  --  5.25*   No results for input(s): TROPONINI in the last 72 hours.  Invalid input(s): CK, MB Hepatic Function Panel Recent Labs    01/11/21 0851  PROT 6.5  ALBUMIN 3.4*  AST 46*  ALT 38  ALKPHOS 85  BILITOT 1.1   Recent Labs    01/12/21 0602  CHOL 138   No results for input(s): PROTIME in the last 72 hours.  Imaging: US RENAL  Result Date: 01/11/2021 CLINICAL DATA:  Acute renal insufficiency EXAM: RENAL / URINARY TRACT ULTRASOUND COMPLETE COMPARISON:  August 20, 2018 FINDINGS: Right Kidney: Renal measurements: 9.4 x 4.3 x 5.9 cm = volume: 124 mL.  Mildly increased echogenicity. No mass or hydronephrosis visualized. Left Kidney: Renal measurements: 8.8 x 4.3 x 3.8 cm = volume: 74 mL. Mildly increased echogenicity. No mass or hydronephrosis visualized. Bladder: Appears normal for degree of bladder distention. Other: Heterogeneous appearance of the prostate gland and questionable enlargement. IMPRESSION: Bilateral increased echogenicity of the kidneys, consistent with medical renal disease. Abnormal appearance of the prostate gland. Please correlate to PSA values. Electronically Signed   By: Fidela Salisbury M.D.   On: 01/11/2021 15:31   ECHOCARDIOGRAM COMPLETE  Result Date: 01/12/2021    ECHOCARDIOGRAM REPORT   Patient Name:   ALIAS VILLAGRAN Steinmeyer Date of Exam: 01/11/2021 Medical Rec #:  [id number]      Height:       68.0 in Accession #:    [id number]     Weight:       229.1 lb Date of Birth:  [birth year]       BSA:          2.165 m Patient Age:    70 years       BP:           157/85 mmHg Patient Gender: M              HR:           86 bpm. Exam Location:  Inpatient Procedure: 2D Echo Indications:  congestive heart failure  History:         Patient has prior history of Echocardiogram examinations, most                  recent 08/19/2018. Prior CABG, chronic kidney disease; Risk                  Factors:Hypertension and Diabetes.  Sonographer:     Johny Chess RDCS Referring Phys:  [place] Diagnosing Phys: Charolette Forward MD IMPRESSIONS  1. Left ventricular ejection fraction, by estimation, is 30 to 35%. The left ventricle has moderate to severely decreased function. The left ventricle demonstrates global hypokinesis. The left ventricular internal cavity size was mildly dilated. Left ventricular diastolic function could not be evaluated.  2. Right ventricular systolic function is mildly reduced. The right ventricular size is normal. There is moderately elevated pulmonary artery systolic pressure.  3. Left atrial size was mildly dilated.  4. The  mitral valve is normal in structure. Moderate mitral valve regurgitation.  5. Tricuspid valve regurgitation is mild to moderate.  6. The aortic valve is normal in structure. Aortic valve regurgitation is not visualized. Mild aortic valve sclerosis is present, with no evidence of aortic valve stenosis.  7. The inferior vena cava is dilated in size with <50% respiratory variability, suggesting right atrial pressure of 15 mmHg. FINDINGS  Left Ventricle: Left ventricular ejection fraction, by estimation, is 30 to 35%. The left ventricle has moderate to severely decreased function. The left ventricle demonstrates global hypokinesis. The left ventricular internal cavity size was mildly dilated. There is no left ventricular hypertrophy. Left ventricular diastolic function could not be evaluated. Right Ventricle: The right ventricular size is normal. No increase in right ventricular wall thickness. Right ventricular systolic function is mildly reduced. There is moderately elevated pulmonary artery systolic pressure. The tricuspid regurgitant velocity is 2.90 m/s, and with an assumed right atrial pressure of 15 mmHg, the estimated right ventricular systolic pressure is 60.7 mmHg. Left Atrium: Left atrial size was mildly dilated. Right Atrium: Right atrial size was normal in size. Pericardium: There is no evidence of pericardial effusion. Mitral Valve: The mitral valve is normal in structure. Moderate mitral valve regurgitation. Tricuspid Valve: The tricuspid valve is normal in structure. Tricuspid valve regurgitation is mild to moderate. Aortic Valve: The aortic valve is normal in structure. Aortic valve regurgitation is not visualized. Mild aortic valve sclerosis is present, with no evidence of aortic valve stenosis. Pulmonic Valve: The pulmonic valve was normal in structure. Pulmonic valve regurgitation is mild. Aorta: The aortic root is normal in size and structure. Venous: The inferior vena cava is dilated in size with  less than 50% respiratory variability, suggesting right atrial pressure of 15 mmHg. IAS/Shunts: No atrial level shunt detected by color flow Doppler.  LEFT VENTRICLE PLAX 2D LVIDd:         6.00 cm LVIDs:         5.50 cm LV PW:         0.80 cm LV IVS:        0.90 cm LVOT diam:     2.10 cm LVOT Area:     3.46 cm  IVC IVC diam: 2.40 cm LEFT ATRIUM              Index        RIGHT ATRIUM           Index LA diam:        4.80 cm  2.22 cm/m   RA Area:     20.30 cm LA Vol (A2C):   100.0 ml 46.19 ml/m  RA Volume:   62.10 ml  28.68 ml/m LA Vol (A4C):   76.8 ml  35.47 ml/m LA Biplane Vol: 89.0 ml  41.10 ml/m   AORTA Ao Root diam: 3.20 cm Ao Asc diam:  3.20 cm MR Peak grad: 97.6 mmHg   TRICUSPID VALVE MR Mean grad: 55.0 mmHg   TV Peak grad:   32.5 mmHg MR Vmax:      494.00 cm/s TV Vmax:        2.85 m/s MR Vmean:     334.0 cm/s  TR Peak grad:   33.6 mmHg                           TR Vmax:        290.00 cm/s                            SHUNTS                           Systemic Diam: 2.10 cm Charolette Forward MD Electronically signed by Charolette Forward MD Signature Date/Time: 01/12/2021/11:45:30 AM    Final     Cardiac Studies:  Assessment/Plan:  Acute on chronic decompensated systolic congestive heart failure Mildly elevated high-sensitivity troponin high the setting of acute congestive heart failure/chronic kidney disease doubt significant MI Coronary artery disease history of MI x2 in the past status post CABG in remote past Ischemic cardiomyopathy Valvular heart disease Nonsustained VT in this setting of electrolyte abnormalities and metabolic acidosis/questionable ischemia Hypertension Diabetes mellitus Peripheral vascular disease status post bilateral below-knee amputation Chronic kidney disease stage IV GERD History of CVA in the past Status post hypokalemia Anemia of chronic disease Metabolic acidosis Plan Increase amiodarone to 200 mg twice daily for a few days and will reduce it to once daily. Will  increase anti-ischemic medications as blood pressure tolerates.  Coronary Correct acidosis and electrolytes Add sodium bicarbonate as per orders Avoid narcotics/sedatives   LOS: 2 days    Charolette Forward 01/13/2021, 12:50 PM

## 2021-01-13 NOTE — Progress Notes (Signed)
PT Cancellation Note  Patient Details Name: [name] MRN: [id number] DOB: 1950/06/08   Cancelled Treatment:    Reason Eval/Treat Not Completed: Other (comment). Pt reports he is too fatigued at this time and just wants to sleep. Will try again tomorrow.    Shary Decamp [facility] 01/13/2021, 11:58 AM [place] Pager [phone] Office [phone]

## 2021-01-13 NOTE — Consult Note (Signed)
Consultation Note Date: 01/13/2021   Patient Name: [name]  DOB: 21-Feb-1951  MRN: [id number]  Age / Sex: 70 y.o., male  PCP: [name], MD Referring Physician: Jacky Kindle, MD  Reason for Consultation: Establishing goals of care  HPI/Patient Profile: 69 y.o. male  with past medical history of HTN, T2DM, previous CVA, s/p CABG x 4, HLD, CKD IV, HFrEF (last EF 35-40% in 2020), and bilateral BKAs admitted on 01/11/2021 with worsening bilateral lower extremity edema in his thighs and shortness of breath that started about 1 week ago.   Patient has AKI on CKD4 with history of refusing HD in many discussions. PMT has been consulted to assist with goals of care conversation.  Clinical Assessment and Goals of Care:  I have reviewed medical records including EPIC notes, labs and imaging, assessed the patient and then met at the bedside to discuss diagnosis prognosis, GOC, EOL wishes, disposition and options.  I introduced Palliative Medicine as specialized medical care for people living with serious illness. It focuses on providing relief from the symptoms and stress of a serious illness. The goal is to improve quality of life for both the patient and the family.  We discussed a brief life review of the patient and then focused on their current illness. The natural disease trajectory and expectations at EOL were discussed.   Gorge is from [place]. He is divorced from his ex-wife of 91 years. She is currently his roommate, as they have lived together in a home owned by his son since June 2022. He has 3 living sons, 1 deceased. His father died when he was a teenager and his mother died notably after struggling with weakness on dialysis. 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 He enjoys traveling, being out all day and driving out to the  country. He reports using prosthetics and ambulating with a cane. States that he can usually get around well without fatigue. He is a man of faith and has been praying about his current situation. He feels that he will heal and get better with continued faith. Holdyn shares that his twin sister started HD last week and has been tolerating this well. His main concern is that he does not want to be too weak to enjoy his active lifestyle and starting HD would not be worthwhile if he is too fatigued to continue his current activities.  I attempted to elicit values and goals of care important to the patient.   Patient strongly values his independence and driving around to different places is a big part of his quality of life. He confirms that he would never want a feeding tube. He strongly prefers to avoid HD as long as possible, however he notes that he may change his mind and reconsider if his renal failure progresses further.   The difference between aggressive medical intervention and comfort care was considered in light of the patient's goals of care.   Advanced directives, concepts  specific to code status, artifical feeding and hydration, and rehospitalization were considered and discussed.  Hospice and Palliative Care services outpatient were explained.  Discussed the importance of continued conversation with family and the medical providers regarding overall plan of care and treatment options, ensuring decisions are within the context of the patient's values and GOCs.    Questions and concerns were addressed.  Hard Choices booklet left for review. The patient was encouraged to call with questions or concerns.  PMT will continue to support holistically.   PATIENT is the primary decision maker. Next of kin are adult children - 3 sons. No HCPOA on file.    SUMMARY OF RECOMMENDATIONS   -Full code/full scope treatment -Patient strongly prefers to avoid HD, however he tells me he may change his mind  depending on clinical course. He is concerned that he would not be able to continue enjoying being out all day if he is too weak/tired from HD -Patient's goal is to improve and continue life-prolonging interventions  -Educated and recommended patient to consider DNR status if he ultimately declines HD  -Psychosocial and emotional support provided -Ongoing support from [facility] Full code  Additional Recommendations (Limitations, Scope, Preferences): No Artificial Feeding  Psycho-social/Spiritual:  Desire for further Chaplaincy support:tbd Additional Recommendations: Education on Hospice and Referral to [employer]   Prognosis:  Unable to determine  Discharge Planning: To Be Determined      Primary Diagnoses: Present on Admission: **None**   I have reviewed the medical record, interviewed the patient and family, and examined the patient. The following aspects are pertinent.  Past Medical History:  Diagnosis Date   CHF (congestive heart failure) (HCC)    Chronic Kidney Disease IV    Exertional shortness of breath    "before my last OR; I'm fine now" (09/26/2012)   GERD (gastroesophageal reflux disease)    High cholesterol    Hx of seasonal allergies    Hypertension    Myocardial infarction [facility])    Scrotal edema 01/30/2019   Stroke [facility]) 2009   memory loss   Type II diabetes mellitus ([place])    Social History   Socioeconomic History   Marital status: Legally Separated    Spouse name: Not on file   Number of children: Not on file   Years of education: Not on file   Highest education level: Not on file  Occupational History   Not on file  Tobacco Use   Smoking status: Former    Packs/day: 0.12    Years: 20.00    Pack years: 2.40    Types: Cigarettes   Smokeless tobacco: Never  Vaping Use   Vaping Use: Never used  Substance and Sexual Activity   Alcohol use: No    Comment: last drink 2010   Drug use: No   Sexual  activity: Never  Other Topics Concern   Not on file  Social History Narrative   Not on file   Social Determinants of Health   Financial Resource Strain: Not on file  Food Insecurity: Not on file  Transportation Needs: Not on file  Physical Activity: Not on file  Stress: Not on file  Social Connections: Not on file   No family history on file. Scheduled Meds:  amiodarone  200 mg Oral Daily   aspirin EC  81 mg Oral q morning   atorvastatin  40 mg Oral q morning   Chlorhexidine Gluconate Cloth  6 each Topical Daily  clopidogrel  75 mg Oral Daily   furosemide  80 mg Intravenous Q12H   gabapentin  300 mg Oral QHS   heparin  5,000 Units Subcutaneous Q8H   hydrALAZINE  25 mg Oral TID   insulin aspart  0-15 Units Subcutaneous TID WC   isosorbide mononitrate  30 mg Oral Daily   metolazone  5 mg Oral Once   metoprolol succinate  50 mg Oral q morning   pantoprazole  40 mg Oral Q0600   tamsulosin  0.4 mg Oral q morning   Continuous Infusions:  amiodarone 30 mg/hr (01/13/21 0600)   PRN Meds:.acetaminophen **OR** acetaminophen, senna-docusate Medications Prior to Admission:  Prior to Admission medications   Medication Sig Start Date End Date Taking? Authorizing Provider  aspirin EC 81 MG tablet Take 81 mg by mouth every morning.   Yes [provider]  atorvastatin (LIPITOR) 40 MG tablet Take 1 tablet (40 mg total) by mouth every morning. Patient taking differently: Take 40 mg by mouth daily. 08/23/18  Yes Mariel Aloe, MD  Cyanocobalamin (VITAMIN B-12 PO) Take 1 tablet by mouth daily.   Yes [provider]  furosemide (LASIX) 80 MG tablet Take 1 tablet (80 mg total) by mouth 2 (two) times daily. 04/17/19 01/11/21 Yes Al Decant, MD  gabapentin (NEURONTIN) 300 MG capsule Take 1 capsule (300 mg total) by mouth at bedtime. 01/08/21  Yes Katsadouros, Vasilios, MD  hydrALAZINE (APRESOLINE) 25 MG tablet TAKE 1 TABLET BY MOUTH THREE TIMES A DAY Patient taking  differently: Take 25 mg by mouth 3 (three) times daily. 12/26/20  Yes Virl Axe, MD  isosorbide mononitrate (IMDUR) 30 MG 24 hr tablet Take 1 tablet (30 mg total) by mouth daily. 06/26/20  Yes [name], MD  metoprolol succinate (TOPROL-XL) 50 MG 24 hr tablet Take 50 mg by mouth every morning. 05/31/18  Yes [provider]  nitroGLYCERIN (NITROSTAT) 0.4 MG SL tablet Place 0.4 mg under the tongue every 5 (five) minutes as needed for chest pain.   Yes [provider]  pantoprazole (PROTONIX) 40 MG tablet Take 1 tablet (40 mg total) by mouth daily at 6 (six) AM. 12/21/18  Yes Charolette Forward, MD  sodium bicarbonate 650 MG tablet Take 650 mg by mouth 2 (two) times daily. 10/31/20  Yes [provider]  sodium zirconium cyclosilicate (LOKELMA) 10 g PACK packet Take 10 g by mouth 2 (two) times daily.   Yes [provider]  tamsulosin (FLOMAX) 0.4 MG CAPS capsule Take 0.4 mg by mouth every morning. 06/11/18  Yes [provider]   No Known Allergies Review of Systems  All other systems reviewed and are negative.  Physical Exam Vitals and nursing note reviewed.  Constitutional:      General: He is not in acute distress.    Appearance: He is ill-appearing.  Cardiovascular:     Rate and Rhythm: Normal rate.  Pulmonary:     Effort: Tachypnea present.  Neurological:     Mental Status: He is alert and oriented to person, place, and time.    Vital Signs: BP 129/86   Pulse 72   Temp 99.3 F (37.4 C) (Oral)   Resp (!) 24   Ht 5' 8"  (1.727 m)   Wt 99.5 kg   SpO2 100%   BMI 33.35 kg/m  Pain Scale: 0-10 POSS *See Group Information*: S-Acceptable,Sleep, easy to arouse Pain Score: 0-No pain   SpO2: SpO2: 100 % O2 Device:SpO2: 100 % O2 Flow Rate: .O2  Flow Rate (L/min): 4 L/min  IO: Intake/output summary:  Intake/Output Summary (Last 24 hours) at 01/13/2021 1018 Last data filed at 01/13/2021 0600 Gross per 24 hour  Intake 445.01 ml  Output 250 ml   Net 195.01 ml    LBM:   Baseline Weight: Weight: 98.3 kg Most recent weight: Weight: 99.5 kg     Palliative Assessment/Data:     Time In: 9:00am Time Out: 10:10am Time Total: 70 minutes Greater than 50% of this time was spent in counseling and coordinating care related to the above assessment and plan.  Dorthy Cooler, PA-C Palliative Medicine Team Team phone # [phone]  Thank you for allowing the Palliative Medicine Team to assist in the care of this patient. Please utilize secure chat with additional questions, if there is no response within 30 minutes please call the above phone number.  Palliative Medicine Team providers are available by phone from 7am to 7pm daily and can be reached through the team cell phone.  Should this patient require assistance outside of these hours, please call the patient's attending physician.

## 2021-01-13 NOTE — Progress Notes (Addendum)
NAME:  [name], MRN:  [id number], DOB:  April 07, 1950, LOS: 2 ADMISSION DATE:  01/11/2021, CONSULTATION DATE:  01/12/2021 REFERRING MD:  Gaylan Gerold, DO , CHIEF COMPLAINT:  SOB/Hypoxia   History of Present Illness:  70 y o M with HTN, DM2, s/p b/l BKA, CAD s/p CABG, CKD stage IV and HFrEF who was admitted with acute on chronic systolic CHF and AKI on CKD4 after he presented with SOB and b/l leg swelling. Today patient started with non-sustained V.tach, cardiology was consulted who recommend amiodarone bolus followed by infusion, patient became SOB, requiring NRB mask, so PCCM was consulted to help with management. Of note patient's kidney function is getting worse and he is oliguric, seen by nephrology but he has been refusing HD.  Pertinent  Medical History   Past Medical History:  Diagnosis Date   CHF (congestive heart failure) (HCC)    Chronic Kidney Disease IV    Exertional shortness of breath    "before my last OR; I'm fine now" (09/26/2012)   GERD (gastroesophageal reflux disease)    High cholesterol    Hx of seasonal allergies    Hypertension    Myocardial infarction [facility])    Scrotal edema 01/30/2019   Stroke [facility]) 2009   memory loss   Type II diabetes mellitus ([place])      Significant Hospital Events: Including procedures, antibiotic start and stop dates in addition to other pertinent events   11/7 VT on floor. Transferred to ICU  Interim History / Subjective:  Patient was transferred to ICU after VT x 2. No acute events overnight  Objective   Blood pressure 111/62, pulse 72, temperature 99.3 F (37.4 C), temperature source Oral, resp. rate 18, height _0  (1.727 m), weight 99.5 kg, SpO2 100 %.        Intake/Output Summary (Last 24 hours) at 01/13/2021 0821 Last data filed at 01/13/2021 0600 Gross per 24 hour  Intake 645.01 ml  Output 250 ml  Net 395.01 ml    Filed Weights   01/11/21 1857 01/12/21 0100  Weight: 98.3 kg 99.5 kg    Examination: General:  elderly appearing male in NAD HEENT: [place]/AT, PERRL.  Neuro: awake alert interactive. Non-focal.  Chest: clear anterior breath sounds.  Heart: NSR with frequent PVC and intermittent NSVT 3-4 beat runs.  Abdomen: Soft, non-tender, non-distended Skin: Grossly intact.    Resolved Hospital Problem list     Assessment & Plan:   Acute on chronic Systolic CHF - Continue aggressive diuretic per nephrology although output has been sub-optimal.  - Lasix bolus + infusion - Metolazone.  - metoprolol, hydralazine, imdur  AKI on CKD4 Hypokalemia/hypomagnesemia/Hypocalcemia - Renal is following, unfortunately patient has been refusing HD, this makes situation tricky as he doesn't make much urine and he is heart failure - Monitor I/O - He has met with palliative and now feels as though HD might be OK depending on clinical course. Will attempt aggressive diuresis first.   Non-sustained VT: Initially felt to be due to electrolyte abnormalities.  - Amiodarone transitioned to oral.  - Cardiology following  Acute hypoxic respiratory failure due to volume overload  - Supplemental oxygen as needed to maintain sats > 92% - Diuresis per nephrology, however, output has been minimal - Declines HD as above - Palliative consult pending  Demand cardiac ischemia - Trops trending down  DM type 2 complicated with vasculopathy s/p b/l BKA - CBG monitoring and SSI  Metabolic acidosis with anion gap elevation.  Uremia?  Although not much higher today.  - send lactic, beta-hydroxybutyrate  - trend BMP  Acute encephalopathy likely due to uremia  - improving  Best Practice (right click and "Reselect all SmartList Selections" daily)   Diet/type: NPO DVT prophylaxis: SCD GI prophylaxis: PPI Lines: N/A Foley:  Yes, and it is still needed Code Status:  full code Last date of multidisciplinary goals of care discussion [Pending]  Labs   CBC: Recent Labs  Lab 01/11/21 0851 01/12/21 0602 01/12/21 1513   WBC 6.0 6.0  --   NEUTROABS 3.6  --   --   HGB 9.4* 9.7* 11.9*  HCT 30.8* 31.1* 35.0*  MCV 91.9 92.3  --   PLT 175 184  --      Basic Metabolic Panel: Recent Labs  Lab 01/11/21 0851 01/11/21 1311 01/11/21 1905 01/12/21 0602 01/12/21 1506 01/12/21 1513 01/13/21 0217  NA 137  --   --  138 140 140 139  K 2.5*  --  3.1* 3.6 3.9 3.2* 4.4  CL 98  --   --  102 102  --  100  CO2 25  --   --  22 21*  --  14*  GLUCOSE 191*  --   --  218* 168*  --  189*  BUN 40*  --   --  42* 45*  --  47*  CREATININE 4.50*  --   --  4.36* 4.83*  --  5.25*  CALCIUM 8.4*  --   --  8.8* 8.8*  --  9.0  MG  --  2.5*  --   --  2.6*  --  2.6*  PHOS  --   --  3.6  --  4.9*  --   --     GFR: Estimated Creatinine Clearance: 15 mL/min (A) (by C-G formula based on SCr of 5.25 mg/dL (H)). Recent Labs  Lab 01/11/21 0851 01/12/21 0602  WBC 6.0 6.0     Liver Function Tests: Recent Labs  Lab 01/11/21 0851  AST 46*  ALT 38  ALKPHOS 85  BILITOT 1.1  PROT 6.5  ALBUMIN 3.4*    No results for input(s): LIPASE, AMYLASE in the last 168 hours. No results for input(s): AMMONIA in the last 168 hours.  ABG    Component Value Date/Time   PHART 7.315 (L) 01/12/2021 1513   PCO2ART 39.0 01/12/2021 1513   PO2ART 163 (H) 01/12/2021 1513   HCO3 19.9 (L) 01/12/2021 1513   TCO2 21 (L) 01/12/2021 1513   ACIDBASEDEF 6.0 (H) 01/12/2021 1513   O2SAT 99.0 01/12/2021 1513      Coagulation Profile: No results for input(s): INR, PROTIME in the last 168 hours.  Cardiac Enzymes: No results for input(s): CKTOTAL, CKMB, CKMBINDEX, TROPONINI in the last 168 hours.  HbA1C: Hemoglobin A1C  Date/Time Value Ref Range Status  06/26/2020 09:27 AM 6.0 (A) 4.0 - 5.6 % Final  07/31/2019 02:09 PM 5.2 4.0 - 5.6 % Final   Hgb A1c MFr Bld  Date/Time Value Ref Range Status  01/12/2021 06:02 AM 6.8 (H) 4.8 - 5.6 % Final    Comment:    (NOTE) Pre diabetes:          5.7%-6.4%  Diabetes:              >6.4%  Glycemic  control for   <7.0% adults with diabetes   12/18/2018 11:11 PM 5.4 4.8 - 5.6 % Final    Comment:    (NOTE) Pre diabetes:  5.7%-6.4% Diabetes:              >6.4% Glycemic control for   <7.0% adults with diabetes     CBG: Recent Labs  Lab 01/12/21 1118 01/12/21 1316 01/12/21 1500 01/12/21 2137 01/13/21 0656  GLUCAP 142* 151* 167* 149* 200*     Review of Systems:   Unable to obtain as patient is encephalopathic  Past Medical History:  He,  has a past medical history of CHF (congestive heart failure) ([place]), Chronic Kidney Disease IV, Exertional shortness of breath, GERD (gastroesophageal reflux disease), High cholesterol, seasonal allergies, Hypertension, Myocardial infarction [facility]), Scrotal edema (01/30/2019), Stroke ([place]) (2009), and Type II diabetes mellitus ([place]).   Surgical History:   Past Surgical History:  Procedure Laterality Date   AMPUTATION Right 07/29/2012   Procedure: AMPUTATION 3RD TOE;  Surgeon: Kerin Salen, MD;  Location: [place];  Service: Orthopedics;  Laterality: Right;  right third toe amputation   AMPUTATION Right 08/02/2012   Procedure: right transmetatarsal amputation;  Surgeon: Kerin Salen, MD;  Location: [place];  Service: Orthopedics;  Laterality: Right;   AMPUTATION Right 09/06/2012   Procedure: AMPUTATION BELOW KNEE;  Surgeon: Kerin Salen, MD;  Location: [place];  Service: Orthopedics;  Laterality: Right;   AMPUTATION Left 09/27/2012   Procedure: AMPUTATION BELOW KNEE;  Surgeon: Kerin Salen, MD;  Location: [place];  Service: Orthopedics;  Laterality: Left;   CATARACT EXTRACTION W/ INTRAOCULAR LENS  IMPLANT, BILATERAL  ?2010   CORONARY ARTERY BYPASS GRAFT  2008   CABG X4   ESOPHAGOGASTRODUODENOSCOPY N/A 10/08/2012   Procedure: ESOPHAGOGASTRODUODENOSCOPY (EGD);  Surgeon: Winfield Cunas., MD;  Location[facility];  Service: Endoscopy;  Laterality: N/A;   PERIPHERALLY INSERTED CENTRAL CATHETER INSERTION Right 09/2012   upper arm      Social History:   reports that he has quit smoking. His smoking use included cigarettes. He has a 2.40 pack-year smoking history. He has never used smokeless tobacco. He reports that he does not drink alcohol and does not use drugs.   Family History:  His family history is not on file.   Allergies No Known Allergies   Home Medications  Prior to Admission medications   Medication Sig Start Date End Date Taking? Authorizing Provider  aspirin EC 81 MG tablet Take 81 mg by mouth every morning.   Yes [provider]  atorvastatin (LIPITOR) 40 MG tablet Take 1 tablet (40 mg total) by mouth every morning. Patient taking differently: Take 40 mg by mouth daily. 08/23/18  Yes Mariel Aloe, MD  Cyanocobalamin (VITAMIN B-12 PO) Take 1 tablet by mouth daily.   Yes [provider]  furosemide (LASIX) 80 MG tablet Take 1 tablet (80 mg total) by mouth 2 (two) times daily. 04/17/19 01/11/21 Yes Al Decant, MD  gabapentin (NEURONTIN) 300 MG capsule Take 1 capsule (300 mg total) by mouth at bedtime. 01/08/21  Yes Katsadouros, Vasilios, MD  hydrALAZINE (APRESOLINE) 25 MG tablet TAKE 1 TABLET BY MOUTH THREE TIMES A DAY Patient taking differently: Take 25 mg by mouth 3 (three) times daily. 12/26/20  Yes Virl Axe, MD  isosorbide mononitrate (IMDUR) 30 MG 24 hr tablet Take 1 tablet (30 mg total) by mouth daily. 06/26/20  Yes [name], MD  metoprolol succinate (TOPROL-XL) 50 MG 24 hr tablet Take 50 mg by mouth every morning. 05/31/18  Yes [provider]  nitroGLYCERIN (NITROSTAT) 0.4 MG SL tablet Place 0.4 mg under the tongue every 5 (  five) minutes as needed for chest pain.   Yes [provider]  pantoprazole (PROTONIX) 40 MG tablet Take 1 tablet (40 mg total) by mouth daily at 6 (six) AM. 12/21/18  Yes Charolette Forward, MD  sodium bicarbonate 650 MG tablet Take 650 mg by mouth 2 (two) times daily. 10/31/20  Yes [provider]  sodium zirconium cyclosilicate  (LOKELMA) 10 g PACK packet Take 10 g by mouth 2 (two) times daily.   Yes [provider]  tamsulosin (FLOMAX) 0.4 MG CAPS capsule Take 0.4 mg by mouth every morning. 06/11/18  Yes [provider]     Critical care time: 39 minutes necessary due to congestive heart failure, Ventricular tachycardia.      Georgann Housekeeper, AGACNP-BC [facility]  See Amion for personal pager PCCM on call pager [phone] until 7pm. Please call Elink 7p-7a. [phone]  01/13/2021 11:15 AM

## 2021-01-13 NOTE — Progress Notes (Signed)
[facility] Progress Note  Name: [name] MRN: [id number] DOB: 07/20/1950  Chief Complaint:  Shortness of breath  Subjective:  Had 150 ml uop over 11/7 charted.  He has been in the ICU.  Didn't get metalozone yesterday.  He states that "yesterday was rough".  He confirms that he would never would dialysis and states that he has come to peace with the fact that untreated renal failure would lead to death.    Review of systems:  "Breathing is Ok now" - yesterday short of breath  No chest pain  No n/v - also better   Intake/Output Summary (Last 24 hours) at 01/13/2021 0555 Last data filed at 01/13/2021 0400 Gross per 24 hour  Intake 611.67 ml  Output 150 ml  Net 461.67 ml    Vitals:  Vitals:   01/13/21 0200 01/13/21 0300 01/13/21 0400 01/13/21 0500  BP: 138/82 (!) 148/78 (!) 146/81 (!) 153/70  Pulse: 83 81 79 72  Resp: 17 17 17 17   Temp:   97.8 F (36.6 C)   TempSrc:   Oral   SpO2: 100% 100% 100% 100%  Weight:      Height:         Physical Exam:  General adult male in bed in no acute distress at rest on 4 liters oxygen HEENT normocephalic atraumatic extraocular movements intact sclera anicteric Neck supple trachea midline Lungs reduced on auscultation bilaterally normal work of breathing at rest  Heart S1S2 no rub Abdomen soft nontender nondistended Extremities sacral and thigh edema bilaterally; bilateral BKA Psych normal mood and affect Neuro awake and conversant; follows command and provides hx   Medications reviewed   Labs:  BMP Latest Ref Rng & Units 01/13/2021 01/12/2021 01/12/2021  Glucose 70 - 99 mg/dL 189(H) - 168(H)  BUN 8 - 23 mg/dL 47(H) - 45(H)  Creatinine 0.61 - 1.24 mg/dL 5.25(H) - 4.83(H)  BUN/Creat Ratio 10 - 24 - - -  Sodium 135 - 145 mmol/L 139 140 140  Potassium 3.5 - 5.1 mmol/L 4.4 3.2(L) 3.9  Chloride 98 - 111 mmol/L 100 - 102  CO2 22 - 32 mmol/L 14(L) - 21(L)  Calcium 8.9 - 10.3 mg/dL 9.0 - 8.8(L)      Assessment/Plan:   # AKI  - may be secondary to cardiorenal vs CKD progression. He would never want renal replacement therapy/dialysis which he has been adamant about in multiple outpatient discussions as well as here per charting.   - Would consult palliative care - lasix 80 mg IV every 12 hours - metolazone 5 mg po once was ordered for 11/7 but not given (may not have been able to take) - try again later today as he appears better suited to PO meds today - check post-void bladder scan and if over 250 ml retained would place foley. If does not void check random bladder scan   # CKD stage IV - baseline reported as 2.7-3.2 per last note - follows with Dr. Candiss Norse; has refused HD per charting (would never want it - watched his mother on it)   # Hypokalemia - normally on lokelma 10 gram BID - hold for now; hypokalemia on admit   # HFrEF  - diuretics as above   # NSVT - on amio; per primary    # Acute hypoxic resp failure  - optimize volume status - diuretics as above  - given his goals would consult palliative as above  # HTN  - acceptable control    #  Normocytic anemia  - secondary to ckd - mild - no PRBC indicated  # Metabolic acidosis - Setting of worsening renal failure  Claudia Desanctis, MD 01/13/2021 6:14 AM

## 2021-01-14 ENCOUNTER — Telehealth: Payer: Self-pay

## 2021-01-14 ENCOUNTER — Inpatient Hospital Stay (HOSPITAL_COMMUNITY): Payer: Medicare Other

## 2021-01-14 DIAGNOSIS — J9601 Acute respiratory failure with hypoxia: Secondary | ICD-10-CM | POA: Diagnosis not present

## 2021-01-14 DIAGNOSIS — I4729 Other ventricular tachycardia: Secondary | ICD-10-CM | POA: Diagnosis not present

## 2021-01-14 DIAGNOSIS — N189 Chronic kidney disease, unspecified: Secondary | ICD-10-CM | POA: Diagnosis not present

## 2021-01-14 DIAGNOSIS — E876 Hypokalemia: Secondary | ICD-10-CM | POA: Diagnosis not present

## 2021-01-14 DIAGNOSIS — N179 Acute kidney failure, unspecified: Secondary | ICD-10-CM | POA: Diagnosis not present

## 2021-01-14 HISTORY — PX: IR US GUIDE VASC ACCESS RIGHT: IMG2390

## 2021-01-14 HISTORY — PX: IR FLUORO GUIDE CV LINE RIGHT: IMG2283

## 2021-01-14 LAB — CBC
HCT: 32 % — ABNORMAL LOW (ref 39.0–52.0)
Hemoglobin: 10.1 g/dL — ABNORMAL LOW (ref 13.0–17.0)
MCH: 28.6 pg (ref 26.0–34.0)
MCHC: 31.6 g/dL (ref 30.0–36.0)
MCV: 90.7 fL (ref 80.0–100.0)
Platelets: 83 10*3/uL — ABNORMAL LOW (ref 150–400)
RBC: 3.53 MIL/uL — ABNORMAL LOW (ref 4.22–5.81)
RDW: 16.1 % — ABNORMAL HIGH (ref 11.5–15.5)
WBC: 8 10*3/uL (ref 4.0–10.5)
nRBC: 0.9 % — ABNORMAL HIGH (ref 0.0–0.2)

## 2021-01-14 LAB — PHOSPHORUS: Phosphorus: 5.1 mg/dL — ABNORMAL HIGH (ref 2.5–4.6)

## 2021-01-14 LAB — BASIC METABOLIC PANEL
Anion gap: 15 (ref 5–15)
BUN: 57 mg/dL — ABNORMAL HIGH (ref 8–23)
CO2: 23 mmol/L (ref 22–32)
Calcium: 9.2 mg/dL (ref 8.9–10.3)
Chloride: 98 mmol/L (ref 98–111)
Creatinine, Ser: 5.84 mg/dL — ABNORMAL HIGH (ref 0.61–1.24)
GFR, Estimated: 10 mL/min — ABNORMAL LOW (ref >60)
Glucose, Bld: 158 mg/dL — ABNORMAL HIGH (ref 70–99)
Potassium: 4.5 mmol/L (ref 3.5–5.1)
Sodium: 136 mmol/L (ref 135–145)

## 2021-01-14 LAB — GLUCOSE, CAPILLARY
Glucose-Capillary: 163 mg/dL — ABNORMAL HIGH (ref 70–99)
Glucose-Capillary: 158 mg/dL — ABNORMAL HIGH (ref 70–99)
Glucose-Capillary: 163 mg/dL — ABNORMAL HIGH (ref 70–99)
Glucose-Capillary: 115 mg/dL — ABNORMAL HIGH (ref 70–99)
Glucose-Capillary: 116 mg/dL — ABNORMAL HIGH (ref 70–99)

## 2021-01-14 LAB — HEPATITIS B SURFACE ANTIBODY,QUALITATIVE: Hep B S Ab: NONREACTIVE

## 2021-01-14 LAB — HEPATITIS B SURFACE ANTIGEN: Hepatitis B Surface Ag: NONREACTIVE

## 2021-01-14 LAB — MAGNESIUM: Magnesium: 2.7 mg/dL — ABNORMAL HIGH (ref 1.7–2.4)

## 2021-01-14 MED ORDER — LIDOCAINE HCL (PF) 1 % IJ SOLN: 5.0000 mL | INTRAMUSCULAR | Status: DC | PRN

## 2021-01-14 MED ORDER — CHLORHEXIDINE GLUCONATE CLOTH 2 % EX PADS
6.0000 | MEDICATED_PAD | Freq: Every day | CUTANEOUS | Status: DC
  Administered 2021-01-15: 6 via TOPICAL

## 2021-01-14 MED ORDER — CEFAZOLIN SODIUM-DEXTROSE 2-4 GM/100ML-% IV SOLN
INTRAVENOUS | Status: AC
  Administered 2021-01-14: 2 g via INTRAVENOUS
  Filled 2021-01-14: qty 100

## 2021-01-14 MED ORDER — LIDOCAINE-PRILOCAINE 2.5-2.5 % EX CREA
1.0000 "application " | TOPICAL_CREAM | CUTANEOUS | Status: DC | PRN
  Filled 2021-01-14: qty 5

## 2021-01-14 MED ORDER — MIDAZOLAM HCL 2 MG/2ML IJ SOLN
INTRAMUSCULAR | Status: AC
  Filled 2021-01-14: qty 4

## 2021-01-14 MED ORDER — HEPARIN SODIUM (PORCINE) 1000 UNIT/ML DIALYSIS
1000.0000 [IU] | INTRAMUSCULAR | Status: DC | PRN
  Administered 2021-01-14: 1000 [IU] via INTRAVENOUS_CENTRAL
  Filled 2021-01-14: qty 1

## 2021-01-14 MED ORDER — HEPARIN SODIUM (PORCINE) 1000 UNIT/ML IJ SOLN
INTRAMUSCULAR | Status: AC | PRN
  Administered 2021-01-14: 3200 [IU] via INTRAVENOUS

## 2021-01-14 MED ORDER — ALTEPLASE 2 MG IJ SOLR: 2.0000 mg | Freq: Once | INTRAMUSCULAR | Status: DC | PRN

## 2021-01-14 MED ORDER — PENTAFLUOROPROP-TETRAFLUOROETH EX AERO: 1.0000 "application " | INHALATION_SPRAY | CUTANEOUS | Status: DC | PRN

## 2021-01-14 MED ORDER — CEFAZOLIN SODIUM-DEXTROSE 2-4 GM/100ML-% IV SOLN: 2.0000 g | Freq: Once | INTRAVENOUS | Status: AC

## 2021-01-14 MED ORDER — MIDAZOLAM HCL 2 MG/2ML IJ SOLN
INTRAMUSCULAR | Status: AC | PRN
  Administered 2021-01-14: 1 mg via INTRAVENOUS

## 2021-01-14 MED ORDER — SODIUM CHLORIDE 0.9 % IV SOLN: 100.0000 mL | INTRAVENOUS | Status: DC | PRN

## 2021-01-14 MED ORDER — CEFAZOLIN SODIUM-DEXTROSE 2-4 GM/100ML-% IV SOLN: 2.0000 g | Freq: Once | INTRAVENOUS | Status: DC

## 2021-01-14 MED ORDER — CHLORHEXIDINE GLUCONATE CLOTH 2 % EX PADS
6.0000 | MEDICATED_PAD | Freq: Every day | CUTANEOUS | Status: DC
  Administered 2021-01-15 – 2021-01-17 (×3): 6 via TOPICAL

## 2021-01-14 MED ORDER — HEPARIN SODIUM (PORCINE) 1000 UNIT/ML IJ SOLN
INTRAMUSCULAR | Status: AC
  Filled 2021-01-14: qty 1

## 2021-01-14 MED ORDER — LIDOCAINE HCL 1 % IJ SOLN
INTRAMUSCULAR | Status: AC
  Administered 2021-01-14: 10 mL
  Filled 2021-01-14: qty 20

## 2021-01-14 NOTE — Progress Notes (Signed)
This chaplain responded to PMT consult for spiritual care with the Pt. decision to start dialysis.  The chaplain listened reflectively to the Pt. story and how he arrived at his decision.  The chaplain understands the Pt. lives out the strong faith he inherited from his parents. The Pt. faith and his hope to spend more time with his grandchildren is stronger than the memories of his mother's journey with dialysis. The Pt. leans on his son-Danny and his sister-Shirley for support. The chaplain listened to story after story and understands the Pt. perseverance and faith helped him achieve his personal and medical goals.  The Pt. accepted the chaplain's invitation for intercessory prayer and F/U spiritual care.  Chaplain Sallyanne Kuster [phone]

## 2021-01-14 NOTE — Progress Notes (Addendum)
NAME:  [name], MRN:  [id number], DOB:  03-28-1950, LOS: 3 ADMISSION DATE:  01/11/2021, CONSULTATION DATE:  01/12/2021 REFERRING MD:  Gaylan Gerold, DO , CHIEF COMPLAINT:  SOB/Hypoxia   History of Present Illness:  70 y o M with HTN, DM2, s/p b/l BKA, CAD s/p CABG, CKD stage IV and HFrEF who was admitted with acute on chronic systolic CHF and AKI on CKD4 after he presented with SOB and b/l leg swelling. Today patient started with non-sustained V.tach, cardiology was consulted who recommend amiodarone bolus followed by infusion, patient became SOB, requiring NRB mask, so PCCM was consulted to help with management. Of note patient's kidney function is getting worse and he is oliguric, seen by nephrology but he has been refusing HD.  Pertinent  Medical History   Past Medical History:  Diagnosis Date   CHF (congestive heart failure) (HCC)    Chronic Kidney Disease IV    Exertional shortness of breath    "before my last OR; I'm fine now" (09/26/2012)   GERD (gastroesophageal reflux disease)    High cholesterol    Hx of seasonal allergies    Hypertension    Myocardial infarction [facility])    Scrotal edema 01/30/2019   Stroke [facility]) 2009   memory loss   Type II diabetes mellitus ([place])      Significant Hospital Events: Including procedures, antibiotic start and stop dates in addition to other pertinent events   11/7 VT on floor. Transferred to ICU 11/8 lasix infusion started 11/9 plans for tunneled catheter placement and initiation of HD.   Interim History / Subjective:  No acute events overnight. No complaints.   Objective   Blood pressure 136/77, pulse 69, temperature 98.7 F (37.1 C), temperature source Oral, resp. rate 20, height 5\' 8"  (1.727 m), weight (P) 102.3 kg, SpO2 100 %.        Intake/Output Summary (Last 24 hours) at 01/14/2021 0857 Last data filed at 01/14/2021 0800 Gross per 24 hour  Intake 477.99 ml  Output 300 ml  Net 177.99 ml    Filed Weights   01/11/21  1857 01/12/21 0100 01/14/21 0600  Weight: 98.3 kg 99.5 kg (P) 102.3 kg    Examination: General: Elderly appearing male in NAD HEENT: [place]/AT, PERRL Neuro: awake alert non-focal Chest: Clear bilateral breath sounds Heart: NSR on monitor. PVC much less frequent.  Abdomen: Soft, non-tender, non-distended Skin: Grossly intact.    Resolved Hospital Problem list     Assessment & Plan:   Acute on chronic Systolic CHF - Continue lasix infusion and metolazone for now until definitive access/plans for HD.  - metoprolol, hydralazine, imdur  AKI on CKD4: he has a long history of declining HD, but now that he has no other option he is willing to get started. His primary concern is quality of life with HD after seeing family members not tolerate it well.  Hypokalemia/hypomagnesemia/Hypocalcemia - Nephrology following - Plans for tunneled HD cath and HD today - Monitor I/O, chemistries  Non-sustained VT: - Amiodarone PO - Cardiology following  Acute hypoxic respiratory failure due to volume overload/pulmonary edema.  - Supplemental oxygen as needed to maintain sats > 92% - Volume removal with HD ultimately. Continue diuretic regimen for now.   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  Renal fialure - trend BMP  Best Practice (right click and "Reselect all SmartList Selections" daily)  Diet/type: NPO DVT prophylaxis: prophylactic heparin  GI prophylaxis: PPI Lines: N/A Foley:  N/A Code Status:  full code Last date of multidisciplinary goals of care discussion [Pending]   Critical care time:      Georgann Housekeeper, AGACNP-BC [place] for personal pager PCCM on call pager [phone] until 7pm. Please call Elink 7p-7a. [phone]  01/14/2021 8:57 AM

## 2021-01-14 NOTE — Plan of Care (Signed)
?  Problem: Cardiac: ?Goal: Ability to achieve and maintain adequate cardiopulmonary perfusion will improve ?Outcome: Progressing ?  ?Problem: Activity: ?Goal: Capacity to carry out activities will improve ?Outcome: Progressing ?  ?

## 2021-01-14 NOTE — Evaluation (Addendum)
Physical Therapy Evaluation Patient Details Name: [name] MRN: [id number] DOB: [birth year] Today's Date: 01/14/2021  History of Present Illness  Pt adm 11/6 with acute on chronic HFrEF. Pt developed Vtach and transferred to ICU. Pt with continued worsening of CKD and going to require HD. PMH: HTN, T2DM, previous CVA, s/p CABG x 4, HLD, CKD IV, HFrEF (last EF 35-40% in 2020), and bilateral BKAs  Clinical Impression  Pt presents to PT with significant decline in mobility due to weakness, decr activity tolerance and inability to don bilateral lower extremity prosthetics due to edema in LE's. Prior to admission pt very independent and he wants to return to that level of function. Will need to get shrinkers on his LE's to facilitate being able to wear prosthetics again. Recommend inpatient rehab at DC.        Recommendations for follow up therapy are one component of a multi-disciplinary discharge planning process, led by the attending physician.  Recommendations may be updated based on patient status, additional functional criteria and insurance authorization.  Follow Up Recommendations Acute inpatient rehab (3hours/day)    Assistance Recommended at Discharge Frequent or constant Supervision/Assistance  Functional Status Assessment Patient has had a recent decline in their functional status and demonstrates the ability to make significant improvements in function in a reasonable and predictable amount of time.  Equipment Recommendations  None recommended by PT    Recommendations for Other Services       Precautions / Restrictions Precautions Precautions: Fall;Other (comment) Precaution Comments: bilateral BKA Required Braces or Orthoses: Other Brace Other Brace: Bilateral prostheses      Mobility  Bed Mobility Overal bed mobility: Needs Assistance Bed Mobility: Supine to Sit;Sit to Supine     Supine to sit: Min guard Sit to supine: Min guard   General bed mobility comments:  Assist for safety and lines for pt to come to long sit and return to supine in bed    Transfers Overall transfer level: Needs assistance Equipment used: None Transfers: Bed to chair/wheelchair/BSC         Anterior-Posterior transfers: Min assist   General transfer comment: Assist for support to allow pt to complete posterior scoot into chair. Pt with fatigue and required several rest breaks. Attempted sit to stand with walker with +2 max assist  to don prosthetics but LE's to swollen for prosthetics to snap into place so unable to stand    Ambulation/Gait               General Gait Details: Unable due to LE's too swollen for prosthetics to fit  Stairs            Wheelchair Mobility    Modified Rankin (Stroke Patients Only)       Balance Overall balance assessment: Needs assistance Sitting-balance support: No upper extremity supported;Feet unsupported Sitting balance-Leahy Scale: Good                                       Pertinent Vitals/Pain Pain Assessment: No/denies pain    Home Living Family/patient expects to be discharged to:: Private residence Living Arrangements: Other (Comment) (ex wife) Available Help at Discharge:  (Pt does not want ex wife to assist him in the home) Type of Home: House Home Access: Stairs to enter Entrance Stairs-Rails: Right Entrance Stairs-Number of Steps: 4   Home Layout: One level Home Equipment: Conservation officer, nature (2 wheels);Cane -  single point;Wheelchair - manual      Prior Function Prior Level of Function : Independent/Modified Independent;Driving             Mobility Comments: Modified independent uses prosthetics and cane       Hand Dominance   Dominant Hand: Right    Extremity/Trunk Assessment   Upper Extremity Assessment Upper Extremity Assessment: Defer to OT evaluation    Lower Extremity Assessment Lower Extremity Assessment: Generalized weakness;LLE deficits/detail;RLE  deficits/detail RLE Deficits / Details: BKA LLE Deficits / Details: BKA       Communication   Communication: No difficulties  Cognition Arousal/Alertness: Awake/alert Behavior During Therapy: Flat affect Overall Cognitive Status: Within Functional Limits for tasks assessed                                          General Comments      Exercises     Assessment/Plan    PT Assessment Patient needs continued PT services  PT Problem List Decreased strength;Decreased balance;Decreased mobility;Decreased activity tolerance       PT Treatment Interventions DME instruction;Functional mobility training;Balance training;Patient/family education;Gait training;Therapeutic activities;Therapeutic exercise;Wheelchair mobility training    PT Goals (Current goals can be found in the Care Plan section)  Acute Rehab PT Goals Patient Stated Goal: Return to prior level of function PT Goal Formulation: With patient Time For Goal Achievement: 01/28/21 Potential to Achieve Goals: Fair    Frequency Min 3X/week   Barriers to discharge Decreased caregiver support      Co-evaluation               AM-PAC PT "6 Clicks" Mobility  Outcome Measure Help needed turning from your back to your side while in a flat bed without using bedrails?: A Little Help needed moving from lying on your back to sitting on the side of a flat bed without using bedrails?: A Little Help needed moving to and from a bed to a chair (including a wheelchair)?: A Little Help needed standing up from a chair using your arms (e.g., wheelchair or bedside chair)?: Total Help needed to walk in hospital room?: Total Help needed climbing 3-5 steps with a railing? : Total 6 Click Score: 12    End of Session   Activity Tolerance: Patient limited by fatigue Patient left: in chair;with call bell/phone within reach Nurse Communication: Mobility status PT Visit Diagnosis: Other abnormalities of gait and  mobility (R26.89);Muscle weakness (generalized) (M62.81)    Time: 6759-1638 PT Time Calculation (min) (ACUTE ONLY): 37 min   Charges:   PT Evaluation $PT Eval Moderate Complexity: 1 Mod PT Treatments $Therapeutic Activity: 8-22 mins        [place] Pager [phone] Office [place] 01/14/2021, 1:52 PM

## 2021-01-14 NOTE — Telephone Encounter (Signed)
A DME order has been placed for this patient for a Prosthetic Shrinker and evaluation for other needs.Patient is in the hospital as of 01-11-2021,will address this after patient is discharged [place], [place] C11/9/202210:18 AM

## 2021-01-14 NOTE — Progress Notes (Signed)
Daily Progress Note   Patient Name: [name]       Date: 01/14/2021 DOB: April 13, 1950  Age: 70 y.o. MRN#: [id number] Attending Physician: Kipp Brood, MD Primary Care Physician: [name], MD Admit Date: 01/11/2021  Reason for Consultation/Follow-up: Establishing goals of care  Subjective: Medical records reviewed. Patient assessed at the bedside. He reports feeling better today and confirms he has decided to proceed with HD. He states "if I want to keep going, I have to do the right thing." Created space and opportunity for patient's thoughts and feelings on his current illness and expectations for the future. He does not feel fear or anxiety, states that Hard Choices booklet has helped somewhat in his decision-making process. He hopes to continue living as long as possible with the same quality of life he has had at baseline. Psychosocial and emotional support provided.   Questions and concerns addressed. PMT will continue to support holistically.   Length of Stay: 3  Current Medications: Scheduled Meds:  . amiodarone  200 mg Oral BID  . aspirin EC  81 mg Oral q morning  . atorvastatin  40 mg Oral q morning  . Chlorhexidine Gluconate Cloth  6 each Topical Daily  . Chlorhexidine Gluconate Cloth  6 each Topical Q0600  . clopidogrel  75 mg Oral Daily  . gabapentin  300 mg Oral QHS  . heparin  5,000 Units Subcutaneous Q8H  . hydrALAZINE  25 mg Oral TID  . insulin aspart  0-15 Units Subcutaneous TID WC  . isosorbide mononitrate  30 mg Oral Daily  . metoprolol succinate  50 mg Oral q morning  . pantoprazole  40 mg Oral Q0600  . tamsulosin  0.4 mg Oral q morning    Continuous Infusions: . sodium chloride Stopped (01/13/21 1834)  . furosemide (LASIX) 200 mg in dextrose 5%  100 mL (2mg /mL) infusion 11 mg/hr (01/14/21 0800)    PRN Meds: sodium chloride, acetaminophen **OR** acetaminophen, senna-docusate  Physical Exam Vitals and nursing note reviewed.  Constitutional:      General: He is not in acute distress.    Appearance: He is ill-appearing.  Cardiovascular:     Rate and Rhythm: Normal rate.  Pulmonary:     Effort: Pulmonary effort is normal.  Musculoskeletal:        General:  No signs of injury.  Skin:    General: Skin is warm and dry.  Neurological:     Mental Status: He is alert and oriented to person, place, and time.            Vital Signs: BP 135/75   Pulse 70   Temp 98.7 F (37.1 C) (Oral)   Resp 13   Ht 5\' 8"  (1.727 m)   Wt 102.3 kg   SpO2 100%   BMI 34.29 kg/m  SpO2: SpO2: 100 % O2 Device: O2 Device: Nasal Cannula O2 Flow Rate: O2 Flow Rate (L/min): 4 L/min  Intake/output summary:  Intake/Output Summary (Last 24 hours) at 01/14/2021 1013 Last data filed at 01/14/2021 0800 Gross per 24 hour  Intake 477.99 ml  Output 300 ml  Net 177.99 ml   LBM:   Baseline Weight: Weight: 98.3 kg Most recent weight: Weight: 102.3 kg       Palliative Assessment/Data: 50-60%     Patient Active Problem List   Diagnosis Date Noted  . Acute on chronic congestive heart failure ([place]) 01/11/2021  . Phantom limb pain ([place]) 01/08/2021  . History of diabetes mellitus 06/26/2020  . Need for pneumococcal vaccination 06/26/2020  . Goals of care, counseling/discussion 01/29/2019  . CKD (chronic kidney disease), stage IV ([place]) 01/29/2019  . Chronic systolic heart failure ([place]) 10/24/2012  . Major depressive disorder, recurrent episode, moderate ([place]) 10/05/2012  . 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 H/O: CVA (cerebrovascular accident) 07/27/2012  . Hypertension 07/27/2012  . Normocytic anemia 07/27/2012    Palliative Care Assessment & Plan   Patient Profile: 70 y.o. male  with past medical history of HTN, T2DM, previous CVA, s/p CABG x 4, HLD, CKD  IV, HFrEF (last EF 35-40% in 2020), and bilateral BKAs admitted on 01/11/2021 with worsening bilateral lower extremity edema in his thighs and shortness of breath that started about 1 week ago.    Patient has AKI on CKD4 with history of refusing HD in many discussions. PMT has been consulted to assist with goals of care conversation.    Assessment: Acute on chronic systolic HF AKI on CKD4 VT, non-sustained BL BKA Acute hypoxic respiratory failure Metabolic acidosis Goals of care conversation  Recommendations/Plan: Continue full code/full scope treatment Patient confirms his decision to proceed with HD Spiritual care consult per patient request Psychosocial and emotional support provided PMT will continue to follow on as as-needed basis. Please secure chat with urgent needs  Goals of Care and Additional Recommendations: Limitations on Scope of Treatment: No Artificial Feeding  Prognosis:  Unable to determine  Discharge Planning: To Be Determined  Care plan was discussed with Patient  Total time: 25 minutes Greater than 50% of this time was spent in counseling and coordinating care related to the above assessment and plan.  Dorthy Cooler, PA-C Palliative Medicine Team Team phone # [phone]  Thank you for allowing the Palliative Medicine Team to assist in the care of this patient. Please utilize secure chat with additional questions, if there is no response within 30 minutes please call the above phone number.  Palliative Medicine Team providers are available by phone from 7am to 7pm daily and can be reached through the team cell phone.  Should this patient require assistance outside of these hours, please call the patient's attending physician.

## 2021-01-14 NOTE — Progress Notes (Signed)
Subjective:  Patient alert, awake, denies any chest pain or shortness of breath.  Noted to have occasional PVCs and couplets.  No further episodes of nonsustained VT today.  States overall feels well.  Patient has agreed for hemodialysis.  Objective:  Vital Signs in the last 24 hours: Temp:  [98.7 F (37.1 C)-98.9 F (37.2 C)] 98.7 F (37.1 C) (11/09 0700) Pulse Rate:  [67-77] 70 (11/09 0900) Resp:  [0-25] 13 (11/09 0900) BP: (121-152)/(47-130) 135/75 (11/09 0900) SpO2:  [97 %-100 %] 100 % (11/09 0900) Weight:  [102.3 kg] 102.3 kg (11/09 0600)  Intake/Output from previous day: 11/08 0701 - 11/09 0700 In: 221.5 [I.V.:140.7; IV Piggyback:80.7] Out: 300 [Urine:300] Intake/Output from this shift: Total I/O In: 256.5 [P.O.:240; I.V.:16.5] Out: -   Physical Exam: Neck: no adenopathy, no carotid bruit, no JVD, and supple, symmetrical, trachea midline Lungs: clear to auscultation bilaterally Heart: regular rate and rhythm, S1, S2 normal, and 2/6 systolic murmur noted Abdomen: soft, non-tender; bowel sounds normal; no masses,  no organomegaly Extremities: bilateral BKA noted  Lab Results: Recent Labs    01/12/21 0602 01/12/21 1513 01/14/21 0519  WBC 6.0  --  8.0  HGB 9.7* 11.9* 10.1*  PLT 184  --  83*   Recent Labs    01/13/21 1454 01/14/21 0119  NA 138 136  K 4.2 4.5  CL 100 98  CO2 22 23  GLUCOSE 208* 158*  BUN 52* 57*  CREATININE 5.67* 5.84*   No results for input(s): TROPONINI in the last 72 hours.  Invalid input(s): CK, MB Hepatic Function Panel No results for input(s): PROT, ALBUMIN, AST, ALT, ALKPHOS, BILITOT, BILIDIR, IBILI in the last 72 hours. Recent Labs    01/12/21 0602  CHOL 138   No results for input(s): PROTIME in the last 72 hours.  Imaging: No results found.  Cardiac Studies:  Assessment/Plan:  Resolving Acute on chronic decompensated systolic congestive heart failure Mildly elevated high-sensitivity troponin high the setting of acute  congestive heart failure/chronic kidney disease doubt significant MI Coronary artery disease history of MI x2 in the past status post CABG in remote past Ischemic cardiomyopathy Valvular heart disease Status postNonsustained VT in this setting of electrolyte abnormalities and metabolic acidosis/questionable ischemia Hypertension Diabetes mellitus Peripheral vascular disease status post bilateral below-knee amputation Chronic kidney disease stage IV GERD History of CVA in the past Status post hypokalemia Anemia of chronic disease Status post Metabolic acidosis Plan Continue present management. Scheduled for tunnel hemodialysis catheter placement today  LOS: 3 days    Charolette Forward 01/14/2021, 10:22 AM

## 2021-01-14 NOTE — Progress Notes (Addendum)
[facility] Progress Note  Name: [name] MRN: [id number] DOB: 1950-07-22  Chief Complaint:  Shortness of breath  Subjective:  Yesterday after discussion with palliative he indicated that he might be open to dialysis.  Given nonresponse his BID IV lasix was discontinued and he was placed on lasix gtt in the interim per critical care.  We increased his metolazone to 10 mg PO once.  I spoke with critical care on 11/8 afternoon.  He had 300 mL UOP over 11/8 charted.  Bladder scan negligible at 10 ml  He tells me that he's changed his mind about dialysis.   He states "I guess we have to do what we have to do".  He states that "all that I'm going through" caused him to change his mind.  Hasn't really had symptoms from any of this before.   Review of systems:   "Breathing is Ok now"  No chest pain  No n/v - resolved.  has emesis basin on bed "just in case" but denies   Intake/Output Summary (Last 24 hours) at 01/14/2021 0544 Last data filed at 01/14/2021 0500 Gross per 24 hour  Intake 254.82 ml  Output 400 ml  Net -145.18 ml    Vitals:  Vitals:   01/14/21 0200 01/14/21 0357 01/14/21 0400 01/14/21 0500  BP: (!) 143/79  138/77 127/76  Pulse: 71  70 69  Resp: 18  20 16   Temp:  98.9 F (37.2 C)    TempSrc:  Oral    SpO2: 97%  98% 100%  Weight:      Height:         Physical Exam:   General adult male in bed in no acute distress at rest HEENT normocephalic atraumatic extraocular movements intact sclera anicteric Neck supple trachea midline Lungs reduced on auscultation bilaterally normal work of breathing at rest on 2 liters oxygen Heart S1S2 no rub Abdomen soft nontender nondistended Extremities sacral and thigh edema bilaterally; bilateral BKA Psych normal mood and affect Neuro awake and conversant; follows command and provides hx   Medications reviewed   Labs:  BMP Latest Ref Rng & Units 01/14/2021 01/13/2021 01/13/2021  Glucose 70 - 99 mg/dL 158(H)  208(H) 189(H)  BUN 8 - 23 mg/dL 57(H) 52(H) 47(H)  Creatinine 0.61 - 1.24 mg/dL 5.84(H) 5.67(H) 5.25(H)  BUN/Creat Ratio 10 - 24 - - -  Sodium 135 - 145 mmol/L 136 138 139  Potassium 3.5 - 5.1 mmol/L 4.5 4.2 4.4  Chloride 98 - 111 mmol/L 98 100 100  CO2 22 - 32 mmol/L 23 22 14(L)  Calcium 8.9 - 10.3 mg/dL 9.2 9.1 9.0     Assessment/Plan:   # AKI  - may be secondary to cardiorenal vs CKD progression.  He states that he has changed his mind about dialysis - Appreciate palliative care and critical care teams - he has had minimal response to diuretics - will discuss line placement with critical care and he is willing to do dialysis which is a change of heart for him - HD after access placement   # CKD stage IV - baseline reported as 2.7-3.2 per last note - follows with Dr. Candiss Norse; has previously stated that he would never want HD as an outpatient per charting but has changed his mind as above  # Hypokalemia - normally on lokelma 10 gram BID (recently increased to BID dosing) - hold for now; hypokalemia on admit - discontinue daily K supplement    # HFrEF   -  diuretics/volume management as above - minimal response   # NSVT - on amio; per primary    # Acute hypoxic resp failure  - optimize volume status - diuretics and volume discussions as above   # HTN  - acceptable control    # Normocytic anemia  - secondary to ckd - mild - no PRBC indicated  # Metabolic acidosis - Setting of worsening renal failure but also note elevated lactic acid - pause sodium bicarb  Claudia Desanctis, MD 01/14/2021 6:12 AM  Patient is ok to go down for a tunneled line with IR.  Making NPO and consulted IR for tunneled catheter.    Claudia Desanctis, MD 7:39 AM

## 2021-01-14 NOTE — Progress Notes (Deleted)
   Patient Status: [facility] - In-pt  Assessment and Plan: Patient in need of tunneled hemodialysis catheter for impaired renal function   ______________________________________________________________________   History of Present Illness: [name] is a 70 y.o. male with PMH of CVA, CABG, bilateral BKAs, HTN, DM II and CKD VI. Pt presented to ED 01/11/21 c/o increased lower extremity edema and SOB for which he was admitted. Pt kidney function has worsened and now requires dialysis. Dr. Harrie Jeans has requested tunneled HD catheter placement.   Allergies and medications reviewed.   Review of Systems: A 12 point ROS discussed and pertinent positives are indicated in the HPI above.  All other systems are negative.  Review of Systems  Respiratory:  Positive for shortness of breath.   Cardiovascular:  Positive for leg swelling.   Vital Signs: BP 135/75   Pulse 70   Temp 98.7 F (37.1 C) (Oral)   Resp 13   Ht 5\' 8"  (1.727 m)   Wt 225 lb 8.5 oz (102.3 kg)   SpO2 100%   BMI 34.29 kg/m   Physical Exam Constitutional:      Appearance: He is ill-appearing.  HENT:     Head: Normocephalic and atraumatic.  Cardiovascular:     Rate and Rhythm: Normal rate.  Pulmonary:     Effort: Pulmonary effort is normal.  Skin:    General: Skin is warm and dry.  Neurological:     Mental Status: He is alert and oriented to person, place, and time.  Psychiatric:        Mood and Affect: Mood normal.        Behavior: Behavior normal.        Thought Content: Thought content normal.        Judgment: Judgment normal.     Imaging reviewed.   Labs:  COAGS: No results for input(s): INR, APTT in the last 8760 hours.  BMP: Recent Labs    01/12/21 1506 01/12/21 1513 01/13/21 0217 01/13/21 1454 01/14/21 0119  NA 140 140 139 138 136  K 3.9 3.2* 4.4 4.2 4.5  CL 102  --  100 100 98  CO2 21*  --  14* 22 23  GLUCOSE 168*  --  189* 208* 158*  BUN 45*  --  47* 52* 57*  CALCIUM 8.8*  --   9.0 9.1 9.2  CREATININE 4.83*  --  5.25* 5.67* 5.84*  GFRNONAA 12*  --  11* 10* 10*    Patient is A&O, calm and pleasant.  He is in no distress. VSS Patient understands and is agreeable that he will receive pain medication for procedure with no sedation.   Risks and benefits discussed with the patient including, but not limited to bleeding, infection, vascular injury, pneumothorax which may require chest tube placement, air embolism or even death  All of the patient's questions were answered, patient is agreeable to proceed. Patient gave verbal consent witnessed by RN at bedside. Consent signed and in chart.     Electronically Signed: Tyson Alias, NP 01/14/2021, 11:35 AM   I spent a total of 15 minutes in face to face in clinical consultation, greater than 50% of which was counseling/coordinating care for tunneled hemodialysis catheter placement.

## 2021-01-14 NOTE — Procedures (Signed)
Interventional Radiology Procedure Note  Procedure: Tunneled HD catheter  Indication: Renal failure  Findings: Please refer to procedural dictation for full description.  Complications: None  EBL: < 10 mL  Witt Plitt, MD [phone]   

## 2021-01-14 NOTE — Progress Notes (Signed)
Internal Medicine Clinic Attending  Case discussed with Dr. Katsadouros  At the time of the visit.  We reviewed the resident's history and exam and pertinent patient test results.  I agree with the assessment, diagnosis, and plan of care documented in the resident's note.  

## 2021-01-14 NOTE — Progress Notes (Signed)
Inpatient Rehab Admissions Coordinator:   Per therapy recommendations,  patient was screened for CIR candidacy by Irelynd Zumstein, MS, CCC-SLP . At this time, Pt. Appears to be a a potential candidate for CIR. I will request   order for rehab consult per protocol for full assessment. Please contact me any with questions.  Taliya Mcclard, MS, CCC-SLP Rehab Admissions Coordinator  [phone] (celll) [phone] (office)  

## 2021-01-14 NOTE — Progress Notes (Signed)
Physical Therapy Treatment Patient Details Name: [name] MRN: [id number] DOB: 10-06-1950 Today's Date: 01/14/2021   History of Present Illness Pt adm 11/6 with acute on chronic HFrEF. Pt developed Vtach and transferred to ICU. Pt with continued worsening of CKD and going to require HD. PMH: HTN, T2DM, previous CVA, s/p CABG x 4, HLD, CKD IV, HFrEF (last EF 35-40% in 2020), and bilateral BKAs    PT Comments    Returned to see pt to assist back to bed which was more difficult than transferring OOB since bed was higher than chair and pt unable to don prosthetics.    Recommendations for follow up therapy are one component of a multi-disciplinary discharge planning process, led by the attending physician.  Recommendations may be updated based on patient status, additional functional criteria and insurance authorization.  Follow Up Recommendations  Acute inpatient rehab (3hours/day)     Assistance Recommended at Discharge Frequent or constant Supervision/Assistance  Equipment Recommendations  None recommended by PT    Recommendations for Other Services       Precautions / Restrictions Precautions Precautions: Fall;Other (comment) Precaution Comments: bilateral BKA Required Braces or Orthoses: Other Brace Other Brace: Bilateral prostheses     Mobility  Bed Mobility Overal bed mobility: Needs Assistance Bed Mobility: Sit to Supine      Sit to supine: Min guard;HOB elevated   General bed mobility comments: Assist for safety and lines for long sit to supine.    Transfers Overall transfer level: Needs assistance Equipment used: None Transfers: Bed to chair/wheelchair/BSC           Lateral/Scoot Transfers: +2 safety/equipment;Mod assist General transfer comment: Used bed pad to assist pt to scoot laterally from chair to bed. Needed incr assist due to bed was higher than recliner so pt had to scoot uphill.    Ambulation/Gait               General Gait  Details: Unable due to LE's too swollen for prosthetics to fit   Stairs             Wheelchair Mobility    Modified Rankin (Stroke Patients Only)       Balance Overall balance assessment: Needs assistance Sitting-balance support: No upper extremity supported;Feet unsupported Sitting balance-Leahy Scale: Good                                      Cognition Arousal/Alertness: Awake/alert Behavior During Therapy: Flat affect Overall Cognitive Status: Within Functional Limits for tasks assessed                                          Exercises      General Comments        Pertinent Vitals/Pain Pain Assessment: No/denies pain    Home Living Family/patient expects to be discharged to:: Private residence Living Arrangements: Other (Comment) (ex wife) Available Help at Discharge:  (Pt does not want ex wife to assist him in the home) Type of Home: House Home Access: Stairs to enter Entrance Stairs-Rails: Right Entrance Stairs-Number of Steps: 4   Home Layout: One level Home Equipment: Conservation officer, nature (2 wheels);Cane - single point;Wheelchair - manual      Prior Function            PT Goals (current  goals can now be found in the care plan section) Acute Rehab PT Goals Patient Stated Goal: Return to prior level of function PT Goal Formulation: With patient Time For Goal Achievement: 01/28/21 Potential to Achieve Goals: Fair Progress towards PT goals: Progressing toward goals    Frequency    Min 3X/week      PT Plan Current plan remains appropriate    Co-evaluation              AM-PAC PT "6 Clicks" Mobility   Outcome Measure  Help needed turning from your back to your side while in a flat bed without using bedrails?: A Little Help needed moving from lying on your back to sitting on the side of a flat bed without using bedrails?: A Little Help needed moving to and from a bed to a chair (including a  wheelchair)?: A Little Help needed standing up from a chair using your arms (e.g., wheelchair or bedside chair)?: Total Help needed to walk in hospital room?: Total Help needed climbing 3-5 steps with a railing? : Total 6 Click Score: 12    End of Session   Activity Tolerance: Patient limited by fatigue Patient left: in bed;with family/visitor present (Pt being taken to radiology) Nurse Communication: Mobility status (nurse assisted with transfer) PT Visit Diagnosis: Other abnormalities of gait and mobility (R26.89);Muscle weakness (generalized) (M62.81)     Time: 8099-8338 PT Time Calculation (min) (ACUTE ONLY): 13 min  Charges:  $Therapeutic Activity: 8-22 mins                     Emmett Pager [phone] Office [place] 01/14/2021, 2:08 PM

## 2021-01-15 DIAGNOSIS — J9601 Acute respiratory failure with hypoxia: Secondary | ICD-10-CM | POA: Diagnosis not present

## 2021-01-15 LAB — GLUCOSE, CAPILLARY
Glucose-Capillary: 151 mg/dL — ABNORMAL HIGH (ref 70–99)
Glucose-Capillary: 132 mg/dL — ABNORMAL HIGH (ref 70–99)
Glucose-Capillary: 141 mg/dL — ABNORMAL HIGH (ref 70–99)
Glucose-Capillary: 136 mg/dL — ABNORMAL HIGH (ref 70–99)
Glucose-Capillary: 114 mg/dL — ABNORMAL HIGH (ref 70–99)
Glucose-Capillary: 147 mg/dL — ABNORMAL HIGH (ref 70–99)

## 2021-01-15 LAB — COMPREHENSIVE METABOLIC PANEL
ALT: 1123 U/L — ABNORMAL HIGH (ref 0–44)
AST: 1221 U/L — ABNORMAL HIGH (ref 15–41)
Albumin: 3.1 g/dL — ABNORMAL LOW (ref 3.5–5.0)
Alkaline Phosphatase: 93 U/L (ref 38–126)
Anion gap: 13 (ref 5–15)
BUN: 50 mg/dL — ABNORMAL HIGH (ref 8–23)
CO2: 23 mmol/L (ref 22–32)
Calcium: 8.5 mg/dL — ABNORMAL LOW (ref 8.9–10.3)
Chloride: 98 mmol/L (ref 98–111)
Creatinine, Ser: 5.31 mg/dL — ABNORMAL HIGH (ref 0.61–1.24)
GFR, Estimated: 11 mL/min — ABNORMAL LOW (ref >60)
Glucose, Bld: 118 mg/dL — ABNORMAL HIGH (ref 70–99)
Potassium: 4.1 mmol/L (ref 3.5–5.1)
Sodium: 134 mmol/L — ABNORMAL LOW (ref 135–145)
Total Bilirubin: 3.1 mg/dL — ABNORMAL HIGH (ref 0.3–1.2)
Total Protein: 6.1 g/dL — ABNORMAL LOW (ref 6.5–8.1)

## 2021-01-15 LAB — CBC
HCT: 30.2 % — ABNORMAL LOW (ref 39.0–52.0)
Hemoglobin: 9.6 g/dL — ABNORMAL LOW (ref 13.0–17.0)
MCH: 28.7 pg (ref 26.0–34.0)
MCHC: 31.8 g/dL (ref 30.0–36.0)
MCV: 90.4 fL (ref 80.0–100.0)
Platelets: 80 10*3/uL — ABNORMAL LOW (ref 150–400)
RBC: 3.34 MIL/uL — ABNORMAL LOW (ref 4.22–5.81)
RDW: 15.9 % — ABNORMAL HIGH (ref 11.5–15.5)
WBC: 8.6 10*3/uL (ref 4.0–10.5)
nRBC: 4.1 % — ABNORMAL HIGH (ref 0.0–0.2)

## 2021-01-15 LAB — PHOSPHORUS: Phosphorus: 4 mg/dL (ref 2.5–4.6)

## 2021-01-15 LAB — MAGNESIUM: Magnesium: 2.4 mg/dL (ref 1.7–2.4)

## 2021-01-15 MED ORDER — LIDOCAINE HCL 1 % IJ SOLN
INTRAMUSCULAR | Status: AC
  Filled 2021-01-15: qty 20

## 2021-01-15 MED ORDER — HEPARIN SODIUM (PORCINE) 1000 UNIT/ML IJ SOLN
INTRAMUSCULAR | Status: AC
  Administered 2021-01-15: 1000 [IU]
  Filled 2021-01-15: qty 4

## 2021-01-15 MED ORDER — DARBEPOETIN ALFA 40 MCG/0.4ML IJ SOSY
40.0000 ug | PREFILLED_SYRINGE | Freq: Once | INTRAMUSCULAR | Status: AC
  Administered 2021-01-15: 40 ug via SUBCUTANEOUS
  Filled 2021-01-15: qty 0.4

## 2021-01-15 MED ORDER — KIDNEY FAILURE BOOK: Freq: Once | Status: AC

## 2021-01-15 NOTE — Progress Notes (Signed)
NAME:  NIKKO GOLDWIRE, MRN:  [id number], DOB:  Mar 10, 1950, LOS: 4 ADMISSION DATE:  01/11/2021, CONSULTATION DATE:  01/12/2021 REFERRING MD:  Gaylan Gerold, DO , CHIEF COMPLAINT:  SOB/Hypoxia   History of Present Illness:  69 y o M with HTN, DM2, s/p b/l BKA, CAD s/p CABG, CKD stage IV and HFrEF who was admitted with acute on chronic systolic CHF and AKI on CKD4 after he presented with SOB and b/l leg swelling. Today patient started with non-sustained V.tach, cardiology was consulted who recommend amiodarone bolus followed by infusion, patient became SOB, requiring NRB mask, so PCCM was consulted to help with management. Of note patient's kidney function is getting worse and he is oliguric, seen by nephrology but he has been refusing HD.  Pertinent  Medical History   Past Medical History:  Diagnosis Date   CHF (congestive heart failure) (HCC)    Chronic Kidney Disease IV    Exertional shortness of breath    "before my last OR; I'm fine now" (09/26/2012)   GERD (gastroesophageal reflux disease)    High cholesterol    Hx of seasonal allergies    Hypertension    Myocardial infarction [facility])    Scrotal edema 01/30/2019   Stroke [facility]) 2009   memory loss   Type II diabetes mellitus ([place])    Significant Hospital Events: Including procedures, antibiotic start and stop dates in addition to other pertinent events   11/7 VT on floor. Transferred to ICU.  Nephrology consulted 11/8 lasix infusion started, PMT consulted 11/9  R TDC placed, first iHD w/ UF 0.5L  Interim History / Subjective:  R TDC placed 10/9 s/p first iHD with 0.5L UF No complaints - no pain/ SOB/ nausea Going for second iHD treatment  Objective   Blood pressure 133/65, pulse 69, temperature 99.1 F (37.3 C), temperature source Oral, resp. rate 17, height 5\' 8"  (1.727 m), weight 101.7 kg, SpO2 100 %.        Intake/Output Summary (Last 24 hours) at 01/15/2021 0912 Last data filed at 01/15/2021 0800 Gross per 24 hour   Intake 813.99 ml  Output 700 ml  Net 113.99 ml   Filed Weights   01/14/21 0600 01/14/21 1525 01/14/21 1745  Weight: 102.3 kg 102.3 kg 101.7 kg   Examination: General:  chronically ill appearing elderly male sitting upright in bed in NAD HEENT: MM pink/moist, pupils 3/reactive, anicteric  Neuro:  Alert/ oriented, MAE CV: rr ir, 1 AVB 60's, occ PVC, R TDC PULM:  non labored, clear / diminished, on 3L [place] GI: obese, soft, bs+, NT, ND  Extremities: warm/dry, BLE BKA with edema  Skin: no rashes  UOP 263ml/ 24hrs  Resolved Hospital Problem list     Assessment & Plan:   Acute on chronic Systolic CHF - 83/05/8248 EF 30-35%, global LV hypokinesis, unable to determine diastolic function, RVSF mildly reduced, PASP 48.6, TR mild to mod, mild AS - fluid removal per iHD - continue metoprolol, hydralazine, imdur - PT recs for CIR  AKI on CKD4: he has a long history of declining HD, but now that he has no other option he is willing to get started. His primary concern is quality of life with HD after seeing family members not tolerate it well.  - R TDC placed by IR 11/9 Hypokalemia/hypomagnesemia/Hypocalcemia - Nephrology following - iHD again today  - serial renal indices, strict I/Os - Monitor I/O, still voids some - ongoing AVF discussions per nephrology - PMT following  Non-sustained VT: -  tele monitoring - Amiodarone PO - Cardiology following - goal K > 4/ Mag > 2  Acute hypoxic respiratory failure due to volume overload/pulmonary edema.  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  Renal fialure - trend BMP/ iHD  Transaminitis - repeat LFTs in am, likely from HF exacerbation, iHD for fluid removal  - consider RUQ [place] if LFTs not improving  Normocytic anemia Thrombocytopenia - H/H stable,  trend CBC - Plt 175 on admit, now 80.  Drop in plts on day 3, not likely HIT.  On empiric VTE heparin   Best Practice (right click and "Reselect all SmartList Selections" daily)   Diet/type: Regular consistency (see orders)- renal/ heart healthy DVT prophylaxis: prophylactic heparin  GI prophylaxis: PPI Lines: Dialysis Catheter R [facility] 11/9 Foley:  N/A Code Status:  full code, PMT following Last date of multidisciplinary goals of care discussion: per PMT discussions.  Pt updated at bedside.   Remains hemodynamically stable and s/p iHD 11/9, tolerated well.  Going again for iHD today.  Will transfer to telemetry and back to IMTS as of 11/11 with PCCM signing off, available as needed.    Critical care time:  n/a     Kennieth Rad, ACNP [facility] 01/15/2021, 9:12 AM  See Amion for pager If no response to pager, please call PCCM consult pager After 7:00 pm call Elink

## 2021-01-15 NOTE — Progress Notes (Signed)
IR tunnel HD cath is oozing at site. On-call IR MD paged and made aware that dressing has been changed x4 during this shift. Awaiting response.

## 2021-01-15 NOTE — Plan of Care (Signed)
?  Problem: Cardiac: ?Goal: Ability to achieve and maintain adequate cardiopulmonary perfusion will improve ?Outcome: Progressing ?  ?Problem: Activity: ?Goal: Capacity to carry out activities will improve ?Outcome: Progressing ?  ?

## 2021-01-15 NOTE — Progress Notes (Signed)
Requested to see pt for out-pt HD arrangements. Met with pt at bedside while pt receiving treatment. Explained role and referral process. Pt prefers [facility] due to pt's mother receiving care at clinic. Referral made to [facility] admissions this morning. Will follow and assist.    Melven Sartorius Renal Navigator [phone]

## 2021-01-15 NOTE — H&P (Addendum)
Chief Complaint: Patient was seen in consultation today for tunneled hemodialysis catheter at the request of Dr. Harrie Jeans  Referring Physician(s): Dr. Harrie Jeans  Supervising Physician: Sandi Mariscal  Patient Status: [facility] - In-pt  History of Present Illness: [name] is a 70 y.o. male PMH of CVA, CABG, bilateral BKAs, HTN, DM II and CKD VI. Pt presented to ED 01/11/21 c/o increased lower extremity edema and SOB for which he was admitted. Pt kidney function has worsened and now requires dialysis. Dr. Harrie Jeans has requested tunneled HD catheter placement.   Past Medical History:  Diagnosis Date   CHF (congestive heart failure) (HCC)    Chronic Kidney Disease IV    Exertional shortness of breath    "before my last OR; I'm fine now" (09/26/2012)   GERD (gastroesophageal reflux disease)    High cholesterol    Hx of seasonal allergies    Hypertension    Myocardial infarction [facility])    Scrotal edema 01/30/2019   Stroke ([place]) 2009   memory loss   Type II diabetes mellitus ([place])     Past Surgical History:  Procedure Laterality Date   AMPUTATION Right 07/29/2012   Procedure: AMPUTATION 3RD TOE;  Surgeon: Kerin Salen, MD;  Location: [place];  Service: Orthopedics;  Laterality: Right;  right third toe amputation   AMPUTATION Right 08/02/2012   Procedure: right transmetatarsal amputation;  Surgeon: Kerin Salen, MD;  Location: [place];  Service: Orthopedics;  Laterality: Right;   AMPUTATION Right 09/06/2012   Procedure: AMPUTATION BELOW KNEE;  Surgeon: Kerin Salen, MD;  Location: [place];  Service: Orthopedics;  Laterality: Right;   AMPUTATION Left 09/27/2012   Procedure: AMPUTATION BELOW KNEE;  Surgeon: Kerin Salen, MD;  Location: [place];  Service: Orthopedics;  Laterality: Left;   CATARACT EXTRACTION W/ INTRAOCULAR LENS  IMPLANT, BILATERAL  ?2010   CORONARY ARTERY BYPASS GRAFT  2008   CABG X4   ESOPHAGOGASTRODUODENOSCOPY N/A 10/08/2012   Procedure: ESOPHAGOGASTRODUODENOSCOPY  (EGD);  Surgeon: Winfield Cunas., MD;  Location: [facility] ENDOSCOPY;  Service: Endoscopy;  Laterality: N/A;   IR FLUORO GUIDE CV LINE RIGHT  01/14/2021   IR US GUIDE VASC ACCESS RIGHT  01/14/2021   PERIPHERALLY INSERTED CENTRAL CATHETER INSERTION Right 09/2012   upper arm    Allergies: Patient has no known allergies.  Medications: Prior to Admission medications   Medication Sig Start Date End Date Taking? Authorizing Provider  aspirin EC 81 MG tablet Take 81 mg by mouth every morning.   Yes [provider]  atorvastatin (LIPITOR) 40 MG tablet Take 1 tablet (40 mg total) by mouth every morning. Patient taking differently: Take 40 mg by mouth daily. 08/23/18  Yes Mariel Aloe, MD  Cyanocobalamin (VITAMIN B-12 PO) Take 1 tablet by mouth daily.   Yes [provider]  furosemide (LASIX) 80 MG tablet Take 1 tablet (80 mg total) by mouth 2 (two) times daily. 04/17/19 01/11/21 Yes Al Decant, MD  gabapentin (NEURONTIN) 300 MG capsule Take 1 capsule (300 mg total) by mouth at bedtime. 01/08/21  Yes Katsadouros, Vasilios, MD  hydrALAZINE (APRESOLINE) 25 MG tablet TAKE 1 TABLET BY MOUTH THREE TIMES A DAY Patient taking differently: Take 25 mg by mouth 3 (three) times daily. 12/26/20  Yes Virl Axe, MD  isosorbide mononitrate (IMDUR) 30 MG 24 hr tablet Take 1 tablet (30 mg total) by mouth daily. 06/26/20  Yes [name], MD  metoprolol succinate (TOPROL-XL) 50 MG  24 hr tablet Take 50 mg by mouth every morning. 05/31/18  Yes [provider]  nitroGLYCERIN (NITROSTAT) 0.4 MG SL tablet Place 0.4 mg under the tongue every 5 (five) minutes as needed for chest pain.   Yes [provider]  pantoprazole (PROTONIX) 40 MG tablet Take 1 tablet (40 mg total) by mouth daily at 6 (six) AM. 12/21/18  Yes Charolette Forward, MD  sodium bicarbonate 650 MG tablet Take 650 mg by mouth 2 (two) times daily. 10/31/20  Yes [provider]  sodium zirconium cyclosilicate (LOKELMA) 10  g PACK packet Take 10 g by mouth 2 (two) times daily.   Yes [provider]  tamsulosin (FLOMAX) 0.4 MG CAPS capsule Take 0.4 mg by mouth every morning. 06/11/18  Yes [provider]     No family history on file.  Social History   Socioeconomic History   Marital status: Legally Separated    Spouse name: Not on file   Number of children: Not on file   Years of education: Not on file   Highest education level: Not on file  Occupational History   Not on file  Tobacco Use   Smoking status: Former    Packs/day: 0.12    Years: 20.00    Pack years: 2.40    Types: Cigarettes   Smokeless tobacco: Never  Vaping Use   Vaping Use: Never used  Substance and Sexual Activity   Alcohol use: No    Comment: last drink 2010   Drug use: No   Sexual activity: Never  Other Topics Concern   Not on file  Social History Narrative   Not on file   Social Determinants of Health   Financial Resource Strain: Not on file  Food Insecurity: Not on file  Transportation Needs: Not on file  Physical Activity: Not on file  Stress: Not on file  Social Connections: Not on file      Review of Systems: A 12 point ROS discussed and pertinent positives are indicated in the HPI above.  All other systems are negative.  Review of Systems  Respiratory:  Positive for shortness of breath.   Cardiovascular:  Positive for leg swelling.   Vital Signs: BP 133/65   Pulse 69   Temp 98.3 F (36.8 C) (Axillary)   Resp 17   Ht 5\' 8"  (1.727 m)   Wt 224 lb 3.3 oz (101.7 kg)   SpO2 100%   BMI 34.09 kg/m   Physical Exam Constitutional:      Appearance: He is ill-appearing.  HENT:     Head: Normocephalic and atraumatic.  Cardiovascular:     Rate and Rhythm: Normal rate.  Pulmonary:     Effort: Pulmonary effort is normal. No respiratory distress.  Skin:    General: Skin is warm and dry.  Neurological:     Mental Status: He is alert and oriented to person, place, and time.  Psychiatric:         Mood and Affect: Mood normal.        Behavior: Behavior normal.        Thought Content: Thought content normal.        Judgment: Judgment normal.    Imaging: DG Chest 2 View  Result Date: 01/11/2021 CLINICAL DATA:  Worsening thigh swelling. EXAM: CHEST - 2 VIEW COMPARISON:  January 30, 2019 FINDINGS: Postsurgical changes from CABG. Enlarged cardiac silhouette. Calcific atherosclerotic disease and tortuosity of the aorta. Streaky bilateral pulmonary opacities may represent scarring given  their stability from prior radiographs. No definite pulmonary edema. Osseous structures are without acute abnormality. Soft tissues are grossly normal. IMPRESSION: 1. Streaky bilateral pulmonary opacities may represent scarring given their stability from prior radiographs. 2. Enlarged cardiac silhouette. Electronically Signed   By: Fidela Salisbury M.D.   On: 01/11/2021 09:44   US RENAL  Result Date: 01/11/2021 CLINICAL DATA:  Acute renal insufficiency EXAM: RENAL / URINARY TRACT ULTRASOUND COMPLETE COMPARISON:  August 20, 2018 FINDINGS: Right Kidney: Renal measurements: 9.4 x 4.3 x 5.9 cm = volume: 124 mL. Mildly increased echogenicity. No mass or hydronephrosis visualized. Left Kidney: Renal measurements: 8.8 x 4.3 x 3.8 cm = volume: 74 mL. Mildly increased echogenicity. No mass or hydronephrosis visualized. Bladder: Appears normal for degree of bladder distention. Other: Heterogeneous appearance of the prostate gland and questionable enlargement. IMPRESSION: Bilateral increased echogenicity of the kidneys, consistent with medical renal disease. Abnormal appearance of the prostate gland. Please correlate to PSA values. Electronically Signed   By: Fidela Salisbury M.D.   On: 01/11/2021 15:31   IR Fluoro Guide CV Line Right  Result Date: 01/14/2021 INDICATION: Acute on chronic kidney injury EXAM: Ultrasound and fluoroscopy guided tunneled dialysis catheter placement MEDICATIONS: Ancef 2 gm IV; The  antibiotic was administered within an appropriate time interval prior to skin puncture. ANESTHESIA/SEDATION: Moderate (conscious) sedation was employed during this procedure. A total of Versed 1 mg was administered intravenously by the radiology nurse. Total intra-service moderate Sedation Time: 11 minutes. The patient's level of consciousness and vital signs were monitored continuously by radiology nursing throughout the procedure under my direct supervision. FLUOROSCOPY TIME:  Fluoroscopy Time: 0 minutes 36 seconds (24 mGy). COMPLICATIONS: None immediate. PROCEDURE: Informed written consent was obtained from the patient after a thorough discussion of the procedural risks, benefits and alternatives. All questions were addressed. Maximal Sterile Barrier Technique was utilized including caps, mask, sterile gowns, sterile gloves, sterile drape, hand hygiene and skin antiseptic. A timeout was performed prior to the initiation of the procedure. The right internal jugular vein was evaluated with ultrasound and shown to be patent. A permanent ultrasound image was obtained and placed in the patient's medical record. Using sterile gel and a sterile probe cover, the right internal jugular vein was entered with a 21 ga needle during real time ultrasound guidance. 0.018 inch guidewire placed and 21 ga needle exchanged for transitional dilator set. Utilizing fluoroscopy, 0.035 inch guidewire advanced through the needle without difficulty. Seriel dilation was performed and peel-away sheath was placed. Attention then turned to the right anterior upper chest. Following local lidocaine administration, the hemodialysis catheter was tunneled from the chest wall to the venotomy site. The catheter was inserted through the peel-away sheath. The tip of the catheter was positioned within the right atrium using fluoroscopic guidance. All lumens of the catheter aspirated and flushed well. The dialysis lumens were locked with Heparin. The  catheter was secured to the skin with suture. The insertion site was covered with sterile dressing. IMPRESSION: 19 cm tunneled right hemodialysis catheter is ready for use. Electronically Signed   By: Miachel Roux M.D.   On: 01/14/2021 15:25   IR US Guide Vasc Access Right  Result Date: 01/14/2021 INDICATION: Acute on chronic kidney injury EXAM: Ultrasound and fluoroscopy guided tunneled dialysis catheter placement MEDICATIONS: Ancef 2 gm IV; The antibiotic was administered within an appropriate time interval prior to skin puncture. ANESTHESIA/SEDATION: Moderate (conscious) sedation was employed during this procedure. A total of Versed 1 mg was administered intravenously by  the radiology nurse. Total intra-service moderate Sedation Time: 11 minutes. The patient's level of consciousness and vital signs were monitored continuously by radiology nursing throughout the procedure under my direct supervision. FLUOROSCOPY TIME:  Fluoroscopy Time: 0 minutes 36 seconds (24 mGy). COMPLICATIONS: None immediate. PROCEDURE: Informed written consent was obtained from the patient after a thorough discussion of the procedural risks, benefits and alternatives. All questions were addressed. Maximal Sterile Barrier Technique was utilized including caps, mask, sterile gowns, sterile gloves, sterile drape, hand hygiene and skin antiseptic. A timeout was performed prior to the initiation of the procedure. The right internal jugular vein was evaluated with ultrasound and shown to be patent. A permanent ultrasound image was obtained and placed in the patient's medical record. Using sterile gel and a sterile probe cover, the right internal jugular vein was entered with a 21 ga needle during real time ultrasound guidance. 0.018 inch guidewire placed and 21 ga needle exchanged for transitional dilator set. Utilizing fluoroscopy, 0.035 inch guidewire advanced through the needle without difficulty. Seriel dilation was performed and peel-away  sheath was placed. Attention then turned to the right anterior upper chest. Following local lidocaine administration, the hemodialysis catheter was tunneled from the chest wall to the venotomy site. The catheter was inserted through the peel-away sheath. The tip of the catheter was positioned within the right atrium using fluoroscopic guidance. All lumens of the catheter aspirated and flushed well. The dialysis lumens were locked with Heparin. The catheter was secured to the skin with suture. The insertion site was covered with sterile dressing. IMPRESSION: 19 cm tunneled right hemodialysis catheter is ready for use. Electronically Signed   By: Miachel Roux M.D.   On: 01/14/2021 15:25   ECHOCARDIOGRAM COMPLETE  Result Date: 01/12/2021    ECHOCARDIOGRAM REPORT   Patient Name:   JAKSEN FIORELLA Tetzloff Date of Exam: 01/11/2021 Medical Rec #:  [id number]      Height:       68.0 in Accession #:    [id number]     Weight:       229.1 lb Date of Birth:  1950/07/12       BSA:          2.165 m Patient Age:    27 years       BP:           157/85 mmHg Patient Gender: M              HR:           86 bpm. Exam Location:  Inpatient Procedure: 2D Echo Indications:     congestive heart failure  History:         Patient has prior history of Echocardiogram examinations, most                  recent 08/19/2018. Prior CABG, chronic kidney disease; Risk                  Factors:Hypertension and Diabetes.  Sonographer:     Johny Chess RDCS Referring Phys:  Onset Diagnosing Phys: Charolette Forward MD IMPRESSIONS  1. Left ventricular ejection fraction, by estimation, is 30 to 35%. The left ventricle has moderate to severely decreased function. The left ventricle demonstrates global hypokinesis. The left ventricular internal cavity size was mildly dilated. Left ventricular diastolic function could not be evaluated.  2. Right ventricular systolic function is mildly reduced. The right ventricular size is normal. There is moderately  elevated pulmonary artery systolic  pressure.  3. Left atrial size was mildly dilated.  4. The mitral valve is normal in structure. Moderate mitral valve regurgitation.  5. Tricuspid valve regurgitation is mild to moderate.  6. The aortic valve is normal in structure. Aortic valve regurgitation is not visualized. Mild aortic valve sclerosis is present, with no evidence of aortic valve stenosis.  7. The inferior vena cava is dilated in size with <50% respiratory variability, suggesting right atrial pressure of 15 mmHg. FINDINGS  Left Ventricle: Left ventricular ejection fraction, by estimation, is 30 to 35%. The left ventricle has moderate to severely decreased function. The left ventricle demonstrates global hypokinesis. The left ventricular internal cavity size was mildly dilated. There is no left ventricular hypertrophy. Left ventricular diastolic function could not be evaluated. Right Ventricle: The right ventricular size is normal. No increase in right ventricular wall thickness. Right ventricular systolic function is mildly reduced. There is moderately elevated pulmonary artery systolic pressure. The tricuspid regurgitant velocity is 2.90 m/s, and with an assumed right atrial pressure of 15 mmHg, the estimated right ventricular systolic pressure is 95.0 mmHg. Left Atrium: Left atrial size was mildly dilated. Right Atrium: Right atrial size was normal in size. Pericardium: There is no evidence of pericardial effusion. Mitral Valve: The mitral valve is normal in structure. Moderate mitral valve regurgitation. Tricuspid Valve: The tricuspid valve is normal in structure. Tricuspid valve regurgitation is mild to moderate. Aortic Valve: The aortic valve is normal in structure. Aortic valve regurgitation is not visualized. Mild aortic valve sclerosis is present, with no evidence of aortic valve stenosis. Pulmonic Valve: The pulmonic valve was normal in structure. Pulmonic valve regurgitation is mild. Aorta: The aortic  root is normal in size and structure. Venous: The inferior vena cava is dilated in size with less than 50% respiratory variability, suggesting right atrial pressure of 15 mmHg. IAS/Shunts: No atrial level shunt detected by color flow Doppler.  LEFT VENTRICLE PLAX 2D LVIDd:         6.00 cm LVIDs:         5.50 cm LV PW:         0.80 cm LV IVS:        0.90 cm LVOT diam:     2.10 cm LVOT Area:     3.46 cm  IVC IVC diam: 2.40 cm LEFT ATRIUM              Index        RIGHT ATRIUM           Index LA diam:        4.80 cm  2.22 cm/m   RA Area:     20.30 cm LA Vol (A2C):   100.0 ml 46.19 ml/m  RA Volume:   62.10 ml  28.68 ml/m LA Vol (A4C):   76.8 ml  35.47 ml/m LA Biplane Vol: 89.0 ml  41.10 ml/m   AORTA Ao Root diam: 3.20 cm Ao Asc diam:  3.20 cm MR Peak grad: 97.6 mmHg   TRICUSPID VALVE MR Mean grad: 55.0 mmHg   TV Peak grad:   32.5 mmHg MR Vmax:      494.00 cm/s TV Vmax:        2.85 m/s MR Vmean:     334.0 cm/s  TR Peak grad:   33.6 mmHg                           TR Vmax:  290.00 cm/s                            SHUNTS                           Systemic Diam: 2.10 cm Charolette Forward MD Electronically signed by Charolette Forward MD Signature Date/Time: 01/12/2021/11:45:30 AM    Final     Labs:  CBC: Recent Labs    01/11/21 0851 01/12/21 0602 01/12/21 1513 01/14/21 0519 01/15/21 0129  WBC 6.0 6.0  --  8.0 8.6  HGB 9.4* 9.7* 11.9* 10.1* 9.6*  HCT 30.8* 31.1* 35.0* 32.0* 30.2*  PLT 175 184  --  83* 80*    COAGS: No results for input(s): INR, APTT in the last 8760 hours.  BMP: Recent Labs    01/13/21 0217 01/13/21 1454 01/14/21 0119 01/15/21 0129  NA 139 138 136 134*  K 4.4 4.2 4.5 4.1  CL 100 100 98 98  CO2 14* 22 23 23   GLUCOSE 189* 208* 158* 118*  BUN 47* 52* 57* 50*  CALCIUM 9.0 9.1 9.2 8.5*  CREATININE 5.25* 5.67* 5.84* 5.31*  GFRNONAA 11* 10* 10* 11*    LIVER FUNCTION TESTS: Recent Labs    01/11/21 0851 01/15/21 0129  BILITOT 1.1 3.1*  AST 46* 1,221*  ALT 38 1,123*   ALKPHOS 85 93  PROT 6.5 6.1*  ALBUMIN 3.4* 3.1*    TUMOR MARKERS: No results for input(s): AFPTM, CEA, CA199, CHROMGRNA in the last 8760 hours.  Assessment and Plan: Hx of CVA, CABG, bilateral BKAs, HTN, DM II and CKD VI. Pt presented to ED 01/11/21 c/o increased lower extremity edema and SOB for which he was admitted. Pt kidney function has worsened and now requires dialysis. Dr. Harrie Jeans has requested tunneled HD catheter placement.  Patient is A&O, calm and pleasant.  He is in no distress.  VSS  Pt has a great increase overnight in liver enzymes. AST from 46 to 1221 today. ALT from 38 to 1123 today.  Totbili 1.1 to 3.1 today WBC WNL PLT 80  Risks and benefits discussed with the patient including, but not limited to bleeding, infection, vascular injury, pneumothorax which may require chest tube placement, air embolism or even death  All of the patient's questions were answered, patient is agreeable to proceed. Consent signed and in chart.   Thank you for this interesting consult.  I greatly enjoyed meeting Draper and look forward to participating in their care.  A copy of this report was sent to the requesting provider on this date.  Electronically Signed: Tyson Alias, NP 01/15/2021, 8:09 AM   I spent a total of 30 minutes in face to face in clinical consultation, greater than 50% of which was counseling/coordinating care for  tunneled hemodialysis catheter.

## 2021-01-15 NOTE — Progress Notes (Signed)
Received page from patient's nurse with concern of hemorrhage at right neck venotomy site.  Patient has saturated multiple dressings in the last 24 hours.    Dressing was removed at bedside.  There was slow continuous oozing from the right neck incision.  It was prepped with Chlorhexidine and anesthestized with subcutaneous lidocaine.  Three interrupted silk sutures were applied at the incision site.  No significant hemorrhage was seen after the stitches.  Pressure dressing was applied over the incision.    [place], MD [phone]

## 2021-01-15 NOTE — Progress Notes (Signed)
Subjective:  Patient denies any chest pain or shortness of breath tolerated hemodialysis.  States overall feels well ago to go home.  Objective:  Vital Signs in the last 24 hours: Temp:  [97.3 F (36.3 C)-99.9 F (37.7 C)] 97.3 F (36.3 C) (11/10 1205) Pulse Rate:  [59-72] 62 (11/10 1205) Resp:  [14-21] 18 (11/10 1205) BP: (96-143)/(54-91) 117/56 (11/10 1205) SpO2:  [91 %-100 %] 100 % (11/10 1205) Weight:  [98.5 kg-102.3 kg] 98.5 kg (11/10 1205)  Intake/Output from previous day: 11/09 0701 - 11/10 0700 In: 736.7 [P.O.:600; I.V.:136.7] Out: 700 [Urine:200] Intake/Output from this shift: Total I/O In: 333.8 [P.O.:250; I.V.:83.8] Out: 1500 [Other:1500]  Physical Exam: Neck: no adenopathy, no carotid bruit, no JVD, and supple, symmetrical, trachea midline Lungs: clear to auscultation bilaterally Heart: regular rate and rhythm, S1, S2 normal, and 2/6 systolic murmur noted.  No pericardial rub Abdomen: soft, non-tender; bowel sounds normal; no masses,  no organomegaly Extremities: status post bilateral BKA  Lab Results: Recent Labs    01/14/21 0519 01/15/21 0129  WBC 8.0 8.6  HGB 10.1* 9.6*  PLT 83* 80*   Recent Labs    01/14/21 0119 01/15/21 0129  NA 136 134*  K 4.5 4.1  CL 98 98  CO2 23 23  GLUCOSE 158* 118*  BUN 57* 50*  CREATININE 5.84* 5.31*   No results for input(s): TROPONINI in the last 72 hours.  Invalid input(s): CK, MB Hepatic Function Panel Recent Labs    01/15/21 0129  PROT 6.1*  ALBUMIN 3.1*  AST 1,221*  ALT 1,123*  ALKPHOS 93  BILITOT 3.1*   No results for input(s): CHOL in the last 72 hours. No results for input(s): PROTIME in the last 72 hours.  Imaging: IR Fluoro Guide CV Line Right  Result Date: 01/14/2021 INDICATION: Acute on chronic kidney injury EXAM: Ultrasound and fluoroscopy guided tunneled dialysis catheter placement MEDICATIONS: Ancef 2 gm IV; The antibiotic was administered within an appropriate time interval prior to skin  puncture. ANESTHESIA/SEDATION: Moderate (conscious) sedation was employed during this procedure. A total of Versed 1 mg was administered intravenously by the radiology nurse. Total intra-service moderate Sedation Time: 11 minutes. The patient's level of consciousness and vital signs were monitored continuously by radiology nursing throughout the procedure under my direct supervision. FLUOROSCOPY TIME:  Fluoroscopy Time: 0 minutes 36 seconds (24 mGy). COMPLICATIONS: None immediate. PROCEDURE: Informed written consent was obtained from the patient after a thorough discussion of the procedural risks, benefits and alternatives. All questions were addressed. Maximal Sterile Barrier Technique was utilized including caps, mask, sterile gowns, sterile gloves, sterile drape, hand hygiene and skin antiseptic. A timeout was performed prior to the initiation of the procedure. The right internal jugular vein was evaluated with ultrasound and shown to be patent. A permanent ultrasound image was obtained and placed in the patient's medical record. Using sterile gel and a sterile probe cover, the right internal jugular vein was entered with a 21 ga needle during real time ultrasound guidance. 0.018 inch guidewire placed and 21 ga needle exchanged for transitional dilator set. Utilizing fluoroscopy, 0.035 inch guidewire advanced through the needle without difficulty. Seriel dilation was performed and peel-away sheath was placed. Attention then turned to the right anterior upper chest. Following local lidocaine administration, the hemodialysis catheter was tunneled from the chest wall to the venotomy site. The catheter was inserted through the peel-away sheath. The tip of the catheter was positioned within the right atrium using fluoroscopic guidance. All lumens of the catheter  aspirated and flushed well. The dialysis lumens were locked with Heparin. The catheter was secured to the skin with suture. The insertion site was covered  with sterile dressing. IMPRESSION: 19 cm tunneled right hemodialysis catheter is ready for use. Electronically Signed   By: Miachel Roux M.D.   On: 01/14/2021 15:25   IR US Guide Vasc Access Right  Result Date: 01/14/2021 INDICATION: Acute on chronic kidney injury EXAM: Ultrasound and fluoroscopy guided tunneled dialysis catheter placement MEDICATIONS: Ancef 2 gm IV; The antibiotic was administered within an appropriate time interval prior to skin puncture. ANESTHESIA/SEDATION: Moderate (conscious) sedation was employed during this procedure. A total of Versed 1 mg was administered intravenously by the radiology nurse. Total intra-service moderate Sedation Time: 11 minutes. The patient's level of consciousness and vital signs were monitored continuously by radiology nursing throughout the procedure under my direct supervision. FLUOROSCOPY TIME:  Fluoroscopy Time: 0 minutes 36 seconds (24 mGy). COMPLICATIONS: None immediate. PROCEDURE: Informed written consent was obtained from the patient after a thorough discussion of the procedural risks, benefits and alternatives. All questions were addressed. Maximal Sterile Barrier Technique was utilized including caps, mask, sterile gowns, sterile gloves, sterile drape, hand hygiene and skin antiseptic. A timeout was performed prior to the initiation of the procedure. The right internal jugular vein was evaluated with ultrasound and shown to be patent. A permanent ultrasound image was obtained and placed in the patient's medical record. Using sterile gel and a sterile probe cover, the right internal jugular vein was entered with a 21 ga needle during real time ultrasound guidance. 0.018 inch guidewire placed and 21 ga needle exchanged for transitional dilator set. Utilizing fluoroscopy, 0.035 inch guidewire advanced through the needle without difficulty. Seriel dilation was performed and peel-away sheath was placed. Attention then turned to the right anterior upper chest.  Following local lidocaine administration, the hemodialysis catheter was tunneled from the chest wall to the venotomy site. The catheter was inserted through the peel-away sheath. The tip of the catheter was positioned within the right atrium using fluoroscopic guidance. All lumens of the catheter aspirated and flushed well. The dialysis lumens were locked with Heparin. The catheter was secured to the skin with suture. The insertion site was covered with sterile dressing. IMPRESSION: 19 cm tunneled right hemodialysis catheter is ready for use. Electronically Signed   By: Miachel Roux M.D.   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 Okay to transfer to cardiac telemetry. from cardiac point of view  LOS: 4 days    Charolette Forward 01/15/2021, 2:20 PM

## 2021-01-15 NOTE — Progress Notes (Signed)
CSW received message from [facility] renal navigator that patient plans to drive himself to HD appts at this time. CSW met with patient at bedside. Patient confirmed with CSW that he will drive himself to his HD appointments. CSW offered patient access [place] application if transportation ever needed to future HD appointments. Patient accepted. No further questions at this time.

## 2021-01-15 NOTE — TOC Initial Note (Signed)
Transition of Care [facility]) - Initial/Assessment Note    Patient Details  Name: [name] MRN: [id number] Date of Birth: Aug 10, 1950  Transition of Care [facility]) CM/SW Contact:    Bethena Roys, RN Phone Number: 01/15/2021, 1:10 PM  Clinical Narrative:  Risk for readmission assessment completed. Prior to arrival patient was from home with family. Patient states he has a primary care provider and gets his medications without any issues. Patient is a new start to HD- has been clipped. PT/OT recommendations for CIR- patient states he would like to return home. Case Manager offered home health services to come into the home and the patient declined services. Patient states he has a rolling walker and wheelchair in the home. Case Manager will continue to follow for additional needs.    Expected Discharge Plan: [place] Barriers to Discharge: Continued Medical Work up   Patient Goals and CMS Choice Patient states their goals for this hospitalization and ongoing recovery are:: to return home      Expected Discharge Plan and Services Expected Discharge Plan: [place] In-house Referral: Hospice / Palliative Care Discharge Planning Services: CM Consult Post Acute Care Choice: [place] arrangements for the past 2 months: Single Family Home                  Prior Living Arrangements/Services Living arrangements for the past 2 months: Single Family Home Lives with:: Self (ex-wife) Patient language and need for interpreter reviewed:: Yes Do you feel safe going back to the place where you live?: Yes      Need for Family Participation in Patient Care: Yes (Comment) Care giver support system in place?: Yes (comment)   Criminal Activity/Legal Involvement Pertinent to Current Situation/Hospitalization: No - Comment as needed  Activities of Daily Living Home Assistive Devices/Equipment: Prosthesis, Shower chair with back, Cane (specify  quad or straight), Walker (specify type), Wheelchair ADL Screening (condition at time of admission) Patient's cognitive ability adequate to safely complete daily activities?: Yes Is the patient deaf or have difficulty hearing?: No Does the patient have difficulty seeing, even when wearing glasses/contacts?: No Does the patient have difficulty concentrating, remembering, or making decisions?: No Patient able to express need for assistance with ADLs?: Yes Does the patient have difficulty dressing or bathing?: No Independently performs ADLs?: Yes (appropriate for developmental age) Does the patient have difficulty walking or climbing stairs?: No Weakness of Legs: None Weakness of Arms/Hands: None  Permission Sought/Granted Permission sought to share information with : Family Supports                Emotional Assessment Appearance:: Appears stated age Attitude/Demeanor/Rapport: Engaged Affect (typically observed): Appropriate Orientation: : Oriented to Place, Oriented to  Time, Oriented to Self, Oriented to Situation Alcohol / Substance Use: Not Applicable Psych Involvement: No (comment)  Admission diagnosis:  Hypokalemia [E87.6] Acute on chronic heart failure (HCC) [I50.9] Acute kidney injury ([place]) [N17.9] Ventricular tachycardia, non-sustained [I47.29] Chronic kidney disease, unspecified CKD stage [N18.9] Acute on chronic congestive heart failure, unspecified heart failure type [facility]) [I50.9] Patient Active Problem List   Diagnosis Date Noted   Acute on chronic congestive heart failure ([place]) 01/11/2021   Phantom limb pain ([place]) 01/08/2021   History of diabetes mellitus 06/26/2020   Need for pneumococcal vaccination 06/26/2020   Goals of care, counseling/discussion 01/29/2019   CKD (chronic kidney disease), stage IV ([place]) 94/70/9628   Chronic systolic heart failure ([place]) 10/24/2012   Major depressive disorder,  recurrent episode, moderate ([place]) 10/05/2012   S/P CABG x 4  07/27/2012   H/O: CVA (cerebrovascular accident) 07/27/2012   Hypertension 07/27/2012   Normocytic anemia 07/27/2012   PCP:  [name], MD Pharmacy:   [id number] [address], [place] [address] Phone: [phone] Fax: [phone]   Readmission Risk Interventions Readmission Risk Prevention Plan 01/15/2021 02/02/2019  Transportation Screening Complete Complete  PCP or Specialist Appt within 5-7 Days - Complete  PCP or Specialist Appt within 3-5 Days Complete -  Home Care Screening - Complete  Medication Review (RN CM) - Complete  HRI or Home Care Consult Complete -  Social Work Consult for Recovery Care Planning/Counseling Complete -  Palliative Care Screening Complete -  Medication Review Press photographer) Complete -  Some recent data might be hidden

## 2021-01-15 NOTE — Progress Notes (Signed)
[facility] Progress Note  Name: [name] MRN: [id number] DOB: 18-Apr-1950  Chief Complaint:  Shortness of breath  Subjective:  He had 200 mL UOP over 11/9 charted.  He had tunneled catheter with IR on 11/9.  He had first HD on 11/10 with 0.5 kg UF.  He wants to be around to see his grandchildren.  He states that he "does want to be poked on right now like that" when we discussed fistula today.  We discussed risks/benefits/indications for AVF vs catheter and will revisit tomorrow - he had initially asked me about a fistula earlier in the week.  No issues overnight per nursing  Review of systems:   Denies shortness of breath No chest pain  No n/v - resolved.  has emesis basin on bed "just in case" but denies any current issues   Intake/Output Summary (Last 24 hours) at 01/15/2021 0547 Last data filed at 01/15/2021 0200 Gross per 24 hour  Intake 714.72 ml  Output 700 ml  Net 14.72 ml    Vitals:  Vitals:   01/14/21 2350 01/15/21 0000 01/15/21 0100 01/15/21 0512  BP:  128/62 120/68   Pulse:  71 72   Resp:  (!) 21 18   Temp: 99.9 F (37.7 C)   98.3 F (36.8 C)  TempSrc: Axillary   Axillary  SpO2:  100% 100%   Weight:      Height:         Physical Exam:   General adult male in bed in no acute distress at rest HEENT normocephalic atraumatic extraocular movements intact sclera anicteric Neck supple trachea midline Lungs reduced on auscultation bilaterally normal work of breathing at rest  Heart S1S2 no rub Abdomen soft nontender nondistended Extremities sacral and thigh edema bilaterally; bilateral BKA Psych normal mood and affect Neuro awake and conversant; follows command and provides hx Access RIJ tunn catheter in place   Medications reviewed   Labs:  BMP Latest Ref Rng & Units 01/15/2021 01/14/2021 01/13/2021  Glucose 70 - 99 mg/dL 118(H) 158(H) 208(H)  BUN 8 - 23 mg/dL 50(H) 57(H) 52(H)  Creatinine 0.61 - 1.24 mg/dL 5.31(H) 5.84(H) 5.67(H)   BUN/Creat Ratio 10 - 24 - - -  Sodium 135 - 145 mmol/L 134(L) 136 138  Potassium 3.5 - 5.1 mmol/L 4.1 4.5 4.2  Chloride 98 - 111 mmol/L 98 98 100  CO2 22 - 32 mmol/L 23 23 22   Calcium 8.9 - 10.3 mg/dL 8.5(L) 9.2 9.1     Assessment/Plan:   # AKI  - may be secondary to cardiorenal vs CKD progression.  He states that he has changed his mind about dialysis, now having symptoms and wants to be here for grandchildren; minimal response to diuretics. S/p tunneled catheter with IR on 11/9 followed by first HD treatment - HD again today and assess needs daily.  Anticipate next tx on 11/12 - stop lasix gtt - Appreciate palliative care and critical care teams - change to renal diet - I have requested CLIP as AKI (may need to change to ESRD) - revisit AVF discussions tomorrow   # CKD stage IV - baseline reported as 2.7-3.2 per last note - follows with Dr. Candiss Norse; has previously stated that he would never want HD as an outpatient per charting but has changed his mind as above  # Hypokalemia - now resolved - normally on lokelma 10 gram BID (recently increased to BID dosing) - hold for now - on renal diet   #  HFrEF   - diuretics/volume management as above - now transitioned to HD to optimize volume status   # NSVT - on amio; per primary    # Acute hypoxic resp failure  - optimize volume status - HD as above  # HTN  - acceptable control    # Normocytic anemia  - secondary to ckd - mild - no PRBC indicated - on retacrit 5,000 units monthly as outpatient - last rec'd 12/23/20 - redose ESA - aranesp 40 mcg once on 81/10  # Metabolic acidosis - Setting of worsening renal failure but also note elevated lactic acid - managed now with HD  # Transaminitis  - critical illness, overload - Per primary team   # Thrombocytopenia - per primary team  - No heparin with HD - noted he is on dvt ppx with heparin - per primary team   Claudia Desanctis, MD 01/15/2021 6:07 AM

## 2021-01-15 NOTE — Progress Notes (Signed)
IP rehab admissions - I met with patient to discuss rehab options.  He is not open to SNF placement.  He prefers to go home with [facility] therapies.  He tells me that he will discuss rehab with his ex-wife to see if she can assist him.  He says he has a roommate but ex-wife helps him.  If he decides to come to CIR, we will need an OT order and evaluation and then will need authorization from [facility].  I will have my partner check back with patient tomorrow for plans.  Patient would not allow me to call his ex-wife.  Call for questions.  [phone]

## 2021-01-15 NOTE — Progress Notes (Addendum)
Round on new start hemodialysis patient. Patient was found lying in the bed with permission granted for the conversation. Spoke with patient about his concern for initiating hemodialysis. Patient reports that his mother was on hemodialysis for a long time and she had a hard time with it, so he just wanted to have time to think about it. He also voiced concern for placement of his fistula as he is a bilateral amputee and went through several surgeries. He states he "doesn't want to be cut on over and over". This RN gave full understanding and educated to the full process of placement of an AV fistula with pictures. We were also able to discuss the full process of hemodialysis with all the patient's questions answered. Patient given handouts and kidney failure book. Will follow-up as appropriate.  Dorthey Sawyer, RN  Dialysis Nurse Coordinator [phone]

## 2021-01-16 DIAGNOSIS — N186 End stage renal disease: Secondary | ICD-10-CM | POA: Diagnosis not present

## 2021-01-16 DIAGNOSIS — Z992 Dependence on renal dialysis: Secondary | ICD-10-CM | POA: Diagnosis not present

## 2021-01-16 DIAGNOSIS — I4729 Other ventricular tachycardia: Secondary | ICD-10-CM | POA: Diagnosis not present

## 2021-01-16 LAB — GLUCOSE, CAPILLARY
Glucose-Capillary: 169 mg/dL — ABNORMAL HIGH (ref 70–99)
Glucose-Capillary: 166 mg/dL — ABNORMAL HIGH (ref 70–99)
Glucose-Capillary: 201 mg/dL — ABNORMAL HIGH (ref 70–99)
Glucose-Capillary: 176 mg/dL — ABNORMAL HIGH (ref 70–99)

## 2021-01-16 LAB — CBC
HCT: 29.1 % — ABNORMAL LOW (ref 39.0–52.0)
Hemoglobin: 9.5 g/dL — ABNORMAL LOW (ref 13.0–17.0)
MCH: 29.3 pg (ref 26.0–34.0)
MCHC: 32.6 g/dL (ref 30.0–36.0)
MCV: 89.8 fL (ref 80.0–100.0)
Platelets: 90 10*3/uL — ABNORMAL LOW (ref 150–400)
RBC: 3.24 MIL/uL — ABNORMAL LOW (ref 4.22–5.81)
RDW: 16.3 % — ABNORMAL HIGH (ref 11.5–15.5)
WBC: 8 10*3/uL (ref 4.0–10.5)
nRBC: 6.5 % — ABNORMAL HIGH (ref 0.0–0.2)

## 2021-01-16 LAB — COMPREHENSIVE METABOLIC PANEL
ALT: 699 U/L — ABNORMAL HIGH (ref 0–44)
AST: 723 U/L — ABNORMAL HIGH (ref 15–41)
Albumin: 2.7 g/dL — ABNORMAL LOW (ref 3.5–5.0)
Alkaline Phosphatase: 98 U/L (ref 38–126)
Anion gap: 12 (ref 5–15)
BUN: 45 mg/dL — ABNORMAL HIGH (ref 8–23)
CO2: 24 mmol/L (ref 22–32)
Calcium: 8 mg/dL — ABNORMAL LOW (ref 8.9–10.3)
Chloride: 97 mmol/L — ABNORMAL LOW (ref 98–111)
Creatinine, Ser: 5.15 mg/dL — ABNORMAL HIGH (ref 0.61–1.24)
GFR, Estimated: 11 mL/min — ABNORMAL LOW (ref >60)
Glucose, Bld: 159 mg/dL — ABNORMAL HIGH (ref 70–99)
Potassium: 3.7 mmol/L (ref 3.5–5.1)
Sodium: 133 mmol/L — ABNORMAL LOW (ref 135–145)
Total Bilirubin: 3.5 mg/dL — ABNORMAL HIGH (ref 0.3–1.2)
Total Protein: 5.5 g/dL — ABNORMAL LOW (ref 6.5–8.1)

## 2021-01-16 LAB — IRON AND TIBC
Iron: 59 ug/dL (ref 45–182)
Saturation Ratios: 31 % (ref 17.9–39.5)
TIBC: 188 ug/dL — ABNORMAL LOW (ref 250–450)
UIBC: 129 ug/dL

## 2021-01-16 LAB — PATHOLOGIST SMEAR REVIEW

## 2021-01-16 LAB — FERRITIN: Ferritin: >7500 ng/mL — ABNORMAL HIGH (ref 24–336)

## 2021-01-16 LAB — HEPATITIS B SURFACE ANTIBODY, QUANTITATIVE: Hep B S AB Quant (Post): <3.1 m[IU]/mL — ABNORMAL LOW (ref >9.9)

## 2021-01-16 LAB — RETICULOCYTES
Immature Retic Fract: 39.3 % — ABNORMAL HIGH (ref 2.3–15.9)
RBC.: 3.21 MIL/uL — ABNORMAL LOW (ref 4.22–5.81)
Retic Count, Absolute: 157.9 10*3/uL (ref 19.0–186.0)
Retic Ct Pct: 4.9 % — ABNORMAL HIGH (ref 0.4–3.1)

## 2021-01-16 MED ORDER — CHLORHEXIDINE GLUCONATE CLOTH 2 % EX PADS
6.0000 | MEDICATED_PAD | Freq: Every day | CUTANEOUS | Status: DC
  Administered 2021-01-18 – 2021-01-25 (×6): 6 via TOPICAL

## 2021-01-16 NOTE — Progress Notes (Addendum)
HD#5 SUBJECTIVE:  Patient Summary: [name] is a 70 y.o. 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   Overnight Events: No overnight events  Interim History: Patient was resting comfortably in his chair eating lunch.  He denies any significant complaints.  He states that his symptoms that provoked his admission have since improved.  He denies any significant complaints since starting hemodialysis.  Does express concern regarding changing his access from his neck to his arm.  I reassured him that he will have permanent access in his arm in the near future.  OBJECTIVE:  Vital Signs: Vitals:   01/16/21 0100 01/16/21 0200 01/16/21 0300 01/16/21 0400  BP:  120/83 124/64 (!) 118/58  Pulse: (!) 55 62 61 (!) 58  Resp: 20 (!) 0 (!) 8   Temp:      TempSrc:      SpO2: (!) 73% 97% 99% 98%  Weight:      Height:       Supplemental O2: Room Air SpO2: 98 % O2 Flow Rate (L/min): 2 L/min  Filed Weights   01/14/21 1745 01/15/21 0925 01/15/21 1205  Weight: 101.7 kg 100.1 kg 98.5 kg     Intake/Output Summary (Last 24 hours) at 01/16/2021 0556 Last data filed at 01/16/2021 0400 Gross per 24 hour  Intake 819.28 ml  Output 1775 ml  Net -955.72 ml   Net IO Since Admission: 157.94 mL [01/16/21 0556]  Physical Exam: Physical Exam Constitutional:      Appearance: Normal appearance.  HENT:     Head: Normocephalic and atraumatic.  Cardiovascular:     Rate and Rhythm: Normal rate and regular rhythm.     Pulses: Normal pulses.     Heart sounds: Normal heart sounds.  Pulmonary:     Effort: Pulmonary effort is normal.     Breath sounds: Normal breath sounds.  Abdominal:     General: Abdomen is flat. There is no distension.      Tenderness: There is no abdominal tenderness.  Musculoskeletal:        General: Deformity (bilateral BKA) present. Normal range of motion.     Cervical back: Normal range of motion.  Skin:    General: Skin is warm and dry.  Neurological:     General: No focal deficit present.     Mental Status: He is alert and oriented to person, place, and time.    Patient Lines/Drains/Airways Status     Active Line/Drains/Airways     Name Placement date Placement time Site Days   Peripheral IV 01/11/21 20 G Left;Posterior Forearm 01/11/21  1411  Forearm  5   Peripheral IV 01/15/21 Right Antecubital 01/15/21  2008  Antecubital  1   Hemodialysis Catheter Right Internal jugular Double lumen Permanent (Tunneled) 01/14/21  1417  Internal jugular  2   Incision 09/06/12 Leg Right 09/06/12  1224  -- 3054   Incision 09/27/12 Leg Left 09/27/12  1529  -- 3033   Wound 10/18/12 Abrasion(s) Leg Right 10/18/12  0914  Leg  3012            Pertinent Labs: CBC Latest Ref Rng & Units 01/16/2021 01/15/2021 01/14/2021  WBC 4.0 - 10.5 K/uL 8.0 8.6 8.0  Hemoglobin 13.0 - 17.0  g/dL 9.5(L) 9.6(L) 10.1(L)  Hematocrit 39.0 - 52.0 % 29.1(L) 30.2(L) 32.0(L)  Platelets 150 - 400 K/uL 90(L) 80(L) 83(L)    CMP Latest Ref Rng & Units 01/16/2021 01/15/2021 01/14/2021  Glucose 70 - 99 mg/dL 159(H) 118(H) 158(H)  BUN 8 - 23 mg/dL 45(H) 50(H) 57(H)  Creatinine 0.61 - 1.24 mg/dL 5.15(H) 5.31(H) 5.84(H)  Sodium 135 - 145 mmol/L 133(L) 134(L) 136  Potassium 3.5 - 5.1 mmol/L 3.7 4.1 4.5  Chloride 98 - 111 mmol/L 97(L) 98 98  CO2 22 - 32 mmol/L 24 23 23   Calcium 8.9 - 10.3 mg/dL 8.0(L) 8.5(L) 9.2  Total Protein 6.5 - 8.1 g/dL 5.5(L) 6.1(L) -  Total Bilirubin 0.3 - 1.2 mg/dL 3.5(H) 3.1(H) -  Alkaline Phos 38 - 126 U/L 98 93 -  AST 15 - 41 U/L 723(H) 1,221(H) -  ALT 0 - 44 U/L 699(H) 1,123(H) -    Recent Labs    01/15/21 1233 01/15/21 1652 01/15/21 2116  GLUCAP 136* 114* 147*     Pertinent Imaging: No results  found.  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  He states that he is tolerating it well.  Appreciate nephrology's assistance with this patient -Strict I's and O's -Monitor kidney function electrolytes daily - Will need permanent access in the near future/VSS consult   Acute on chronic Systolic HFrEF (EF 29-92%) Mildly Reduced RV function: Moderate TVR and Mild AS Patient continues to improve since starting hemodialysis. -Appreciate Cardiology and nephrology's assistance -Continue metoprolol, hydralazine, and Imdur - Will continue to monitor electrolytes daily  Non-sustained VT: No further episodes of VT and tolerating Amio -Continue amiodarone 200 mg p.o. 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 Likely a component of iron def.  - Will need ferritin, reticulocyte and iron studies - Smear pending   Best Practice: Diet: Regular diet IVF: Fluids: none, Rate: None VTE: heparin injection 5,000 Units Start: 01/11/21 1430 Code: Full AB: none Therapy Recs: CIR, DME: none Family Contact: pending DISPO: Anticipated discharge in 1-2 days to Rehab pending Medical stability.  Signature: Lawerance Cruel, D.O.  Internal Medicine Resident, PGY-3 [facility]  Pager: [phone] 5:56 AM, 01/16/2021   Please contact the on call pager after 5 pm and on weekends at [phone].

## 2021-01-16 NOTE — Progress Notes (Signed)
Physical Therapy Treatment Patient Details Name: [name] MRN: [id number] DOB: [birth year] Today's Date: 01/16/2021   History of Present Illness Pt adm 11/6 with acute on chronic HFrEF. Pt developed Vtach and transferred to ICU. Pt with continued worsening of CKD and going to require HD. PMH: HTN, T2DM, previous CVA, s/p CABG x 4, HLD, CKD IV, HFrEF (last EF 35-40% in 2020), and bilateral BKAs    PT Comments    Pt continues to display decreased mobility due to weakness, decr balance, and edema in LE's preventing him from donning prostheses. Shrinker socks ordered for residual limbs to address edema and work toward being able to don prostheses. Until pt is able to don prostheses he will need to work on transfers to/from wheelchair and performing activities at a w/c level. With fluid fluctuations that sometimes occur with HD he will need to be able to perform daily activities with or without prostheses. Feel patient needs inpatient rehab to reach this level prior to return home.    Recommendations for follow up therapy are one component of a multi-disciplinary discharge planning process, led by the attending physician.  Recommendations may be updated based on patient status, additional functional criteria and insurance authorization.  Follow Up Recommendations  Acute inpatient rehab (3hours/day)     Assistance Recommended at Discharge Intermittent Supervision/Assistance  Equipment Recommendations  Other (comment) (sliding board)    Recommendations for Other Services       Precautions / Restrictions Precautions Precautions: Fall;Other (comment) Precaution Comments: bilateral BKA Required Braces or Orthoses: Other Brace Other Brace: Bilateral prostheses     Mobility  Bed Mobility Overal bed mobility: Needs Assistance Bed Mobility: Supine to Sit     Supine to sit: Min assist;HOB elevated     General bed mobility comments: Assist to elevate trunk into long sitting     Transfers Overall transfer level: Needs assistance Equipment used: None Transfers: Bed to chair/wheelchair/BSC            Lateral/Scoot Transfers: +2 physical assistance;Min assist General transfer comment: Used bed pad to assist scooting bed to drop arm chair    Ambulation/Gait               General Gait Details: Unable due to LE's too swollen for prosthetics to fit   Stairs             Wheelchair Mobility    Modified Rankin (Stroke Patients Only)       Balance Overall balance assessment: Needs assistance Sitting-balance support: Bilateral upper extremity supported;Feet unsupported Sitting balance-Leahy Scale: Poor                                      Cognition Arousal/Alertness: Awake/alert Behavior During Therapy: Flat affect Overall Cognitive Status: Impaired/Different from baseline Area of Impairment: Safety/judgement;Awareness                         Safety/Judgement: Decreased awareness of deficits;Decreased awareness of safety Awareness: Intellectual   General Comments: Pt with difficulty with logical reasoning with regards to return home if unable to get prosthetics on. Pt stating he will do fine at home. When asked how he would get up on his own he stated he would put on his prosthetics to get up. When it was pointed out he couldn't get his prosthetics on right now. He wasn't able to state his plan to  get up.        Exercises      General Comments General comments (skin integrity, edema, etc.): VSS on RA      Pertinent Vitals/Pain Pain Assessment: No/denies pain    Home Living                          Prior Function            PT Goals (current goals can now be found in the care plan section) Progress towards PT goals: Progressing toward goals    Frequency    Min 3X/week      PT Plan Current plan remains appropriate    Co-evaluation PT/OT/SLP Co-Evaluation/Treatment:  Yes Reason for Co-Treatment: For patient/therapist safety PT goals addressed during session: Mobility/safety with mobility        AM-PAC PT "6 Clicks" Mobility   Outcome Measure  Help needed turning from your back to your side while in a flat bed without using bedrails?: A Little Help needed moving from lying on your back to sitting on the side of a flat bed without using bedrails?: A Little Help needed moving to and from a bed to a chair (including a wheelchair)?: Total Help needed standing up from a chair using your arms (e.g., wheelchair or bedside chair)?: Total Help needed to walk in hospital room?: Total Help needed climbing 3-5 steps with a railing? : Total 6 Click Score: 10    End of Session   Activity Tolerance: Patient tolerated treatment well Patient left: in chair;with call bell/phone within reach;with chair alarm set Nurse Communication: Mobility status;Need for lift equipment PT Visit Diagnosis: Other abnormalities of gait and mobility (R26.89);Muscle weakness (generalized) (M62.81)     Time: 9458-5929 PT Time Calculation (min) (ACUTE ONLY): 22 min  Charges:  $Therapeutic Activity: 8-22 mins                     [place] Pager [phone] Office [place] 01/16/2021, 12:13 PM

## 2021-01-16 NOTE — Progress Notes (Signed)
This chaplain is present with the Pt. for F/U spiritual care after two sessions of HD.  The chaplain understands the Pt. spiritual beliefs  "the Pt. points to God" determines life or death.  The Pt. also states he trusts his son-Danny for medical decision making if the Pt. is unable to make decisions for himself.  "Kasandra Knudsen knows what is important me."  The chaplain begins the [place] conversation with the Pt.; without a HCPOA, the Pt. children will share medical decision making. The Pt. decides to move Danny to the top of the Emergency Contact List.  The chaplain offers the Pt. the opportunity to talk about "tired of being cut on."  The Pt shares "I wish the healthcare team would have gone with the fistula first, if there was a possibility the catheter wasn't going to work." The Pt. adds "I went through the same thing with my legs."  The Pt. asked the chaplain about the timing of the fistula and dialysis, but was not ready to follow up with the RN to answer the questions.  The chaplain and Pt. decided to pause and ended the visit with prayer.  Chaplain Sallyanne Kuster [phone]

## 2021-01-16 NOTE — Progress Notes (Signed)
Round on patient today for follow-up. Patient is pleasant and preparing for a bath with his RN. Patient reports that he is now agreeable to fistula placement. And patient with no further bleeding at catheter site. Will continue to follow as appropriate.  Dorthey Sawyer, RN  Dialysis Nurse Coordinator [phone]

## 2021-01-16 NOTE — Discharge Instructions (Addendum)
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 Please continue to go to dialysis on the Tuesday, Thursday, Saturday schedule and follow up with your nephrologist (kidney doctor). Please also go to the internal medicine center for a follow up with your primary care physician.  Please stop taking flomax and hydralazine. Your blood pressure was low while you were in the hospital, and we do not want you taking hydralazine. Please start taking amiodarone and lasix, as prescribed by the heart doctors. If you have any questions or concerns, call our clinic at [phone] or after hours call [phone] and ask for the internal medicine resident on call.       [facility]  Discharge Instructions  AV Fistula or Graft Surgery for Dialysis Access  Please refer to the following instructions for your post-procedure care. Your surgeon or physician assistant will discuss any changes with you.  Activity  You may drive the day following your surgery, if you are comfortable and no longer taking prescription pain medication. Resume full activity as the soreness in your incision resolves.  Bathing/Showering  You may shower after you go home. Keep your incision dry for 48 hours. Do not soak in a bathtub, hot tub, or swim until the incision heals completely. You may not shower if you have a hemodialysis catheter.  Incision Care  Clean your incision with mild soap and water after 48 hours. Pat the area dry with a clean towel. You do not need a bandage unless otherwise instructed. Do not apply any ointments or creams to your incision. You may have skin glue on your incision. Do not peel it off. It will come off on its own in about one week. Your arm may swell a bit after surgery. To reduce swelling use pillows to elevate your arm so it is above your heart. Your doctor will  tell you if you need to lightly wrap your arm with an ACE bandage.  Diet  Resume your normal diet. There are not special food restrictions following this procedure. In order to heal from your surgery, it is CRITICAL to get adequate nutrition. Your body requires vitamins, minerals, and protein. Vegetables are the best source of vitamins and minerals. Vegetables also provide the perfect balance of protein. Processed food has little nutritional value, so try to avoid this.  Medications  Resume taking all of your medications. If your incision is causing pain, you may take over-the counter pain relievers such as acetaminophen (Tylenol). If you were prescribed a stronger pain medication, please be aware these medications can cause nausea and constipation. Prevent nausea by taking the medication with a snack or meal. Avoid constipation by drinking plenty of fluids and eating foods with high amount of fiber, such as fruits, vegetables, and grains. Do not take Tylenol if you are taking prescription pain medications.     Follow up Your surgeon may want to see you in the office following your access surgery. If so, this will be arranged at the time of your surgery.  Please call us immediately for any of the following conditions:  Increased pain, redness, drainage (pus) from your incision site Fever of 101 degrees or higher Severe or worsening pain at your incision site Hand pain or numbness.  Reduce your risk of vascular disease:  Stop smoking. If you would like help, call [facility] at 1-800-QUIT-NOW 908-733-8853) or [facility]  at (517) 031-9049  Manage your cholesterol Maintain a desired weight Control your diabetes Keep your blood pressure down  Dialysis  It will take several weeks to several months for your new dialysis access to be ready for use. Your surgeon will determine when it is OK to use it. Your nephrologist will continue to direct your dialysis. You can continue to use your  Permcath until your new access is ready for use.  If you have any questions, please call the office at [phone].

## 2021-01-16 NOTE — Progress Notes (Signed)
Orthopedic Tech Progress Note Patient Details:  [name] 02/19/1951 [id number] Called order into [employer] for shrinkers Patient ID: [name], male   DOB: 05/11/1950, 70 y.o.   MRN: [id number]  [name] 01/16/2021, 2:16 PM

## 2021-01-16 NOTE — Evaluation (Signed)
Occupational Therapy Evaluation Patient Details Name: [name] MRN: [id number] DOB: Dec 11, 1950 Today's Date: 01/16/2021   History of Present Illness Pt adm 11/6 with acute on chronic HFrEF. Pt developed Vtach and transferred to ICU. Pt with continued worsening of CKD and going to require HD. PMH: HTN, T2DM, previous CVA, s/p CABG x 4, HLD, CKD IV, HFrEF (last EF 35-40% in 2020), and bilateral BKAs   Clinical Impression   Pt was functioning modified independently with a cane and B LE prostheses prior to admission. He is currently unable to utilize his prostheses due to LE edema. Pt presents with generalized weakness and decreased activity tolerance. He requires +2 min assist with use of bed pad under hips to perform lateral scoot from bed to chair and min to max  assistance for ADL. Pt with poor insight into skills necessary for safe discharge home, but is amenable to intensive rehab. Pt with good potential to return to modified independence.      Recommendations for follow up therapy are one component of a multi-disciplinary discharge planning process, led by the attending physician.  Recommendations may be updated based on patient status, additional functional criteria and insurance authorization.   Follow Up Recommendations  Acute inpatient rehab (3hours/day)    Assistance Recommended at Discharge    Functional Status Assessment  Patient has had a recent decline in their functional status and demonstrates the ability to make significant improvements in function in a reasonable and predictable amount of time.  Equipment Recommendations  BSC/3in1;Wheelchair (measurements OT);Wheelchair cushion (measurements OT) (drop arm)    Recommendations for Other Services       Precautions / Restrictions Precautions Precautions: Fall;Other (comment) Precaution Comments: bilateral BKA Required Braces or Orthoses: Other Brace Other Brace: Bilateral prostheses, unable to use due to swelling       Mobility Bed Mobility Overal bed mobility: Needs Assistance Bed Mobility: Supine to Sit     Supine to sit: Min assist;HOB elevated     General bed mobility comments: Assist to elevate trunk into long sitting    Transfers Overall transfer level: Needs assistance Equipment used: None Transfers: Bed to chair/wheelchair/BSC            Lateral/Scoot Transfers: +2 physical assistance;Min assist General transfer comment: Used bed pad to assist scooting bed to drop arm chair      Balance Overall balance assessment: Needs assistance Sitting-balance support: Bilateral upper extremity supported;Feet unsupported Sitting balance-Leahy Scale: Poor                                     ADL either performed or assessed with clinical judgement   ADL Overall ADL's : Needs assistance/impaired Eating/Feeding: Minimal assistance;Sitting Eating/Feeding Details (indicate cue type and reason): assist to open containers Grooming: Wash/dry hands;Sitting;Set up   Upper Body Bathing: Set up;Sitting   Lower Body Bathing: Maximum assistance;Sitting/lateral leans   Upper Body Dressing : Set up;Sitting   Lower Body Dressing: Maximum assistance;Sitting/lateral leans   Toilet Transfer: +2 for physical assistance;Minimal assistance (lateral transfer)   Toileting- Clothing Manipulation and Hygiene: Moderate assistance;Sitting/lateral lean               Vision Baseline Vision/History: 1 Wears glasses Patient Visual Report: No change from baseline       Perception     Praxis      Pertinent Vitals/Pain Pain Assessment: No/denies pain     Hand Dominance Right  Extremity/Trunk Assessment Upper Extremity Assessment Upper Extremity Assessment: RUE deficits/detail;LUE deficits/detail RUE Coordination: decreased fine motor LUE Coordination: decreased fine motor   Lower Extremity Assessment Lower Extremity Assessment: Defer to PT evaluation LLE Deficits / Details:  BKA       Communication Communication Communication: No difficulties   Cognition Arousal/Alertness: Awake/alert Behavior During Therapy: Flat affect Overall Cognitive Status: Impaired/Different from baseline Area of Impairment: Safety/judgement                         Safety/Judgement: Decreased awareness of deficits Awareness: Intellectual   General Comments: Pt with difficulty with logical reasoning with regards to return home if unable to get prosthetics on. Pt stating he will do fine at home. When asked how he would get up on his own he stated he would put on his prosthetics to get up. When it was pointed out he couldn't get his prosthetics on right now. He wasn't able to state his plan to get up.     General Comments  VSS on RA    Exercises     Shoulder Instructions      Home Living Family/patient expects to be discharged to:: Private residence Living Arrangements: Other (Comment) (ex wife) Available Help at Discharge: Family Type of Home: House Home Access: Stairs to enter Technical brewer of Steps: 4 Entrance Stairs-Rails: Right Home Layout: One level     Bathroom Shower/Tub: Other (comment) (sponges bathes at sink)   Biochemist, clinical: Standard     Home Equipment: Conservation officer, nature (2 wheels);Cane - single point;Wheelchair - manual   Additional Comments: reports his w/c arm is broken, gave his BSC away      Prior Functioning/Environment Prior Level of Function : Independent/Modified Independent;Driving             Mobility Comments: Modified independent uses prosthetics and cane ADLs Comments: sponge bathes in standing at sink        OT Problem List: Decreased strength;Impaired balance (sitting and/or standing);Decreased cognition;Decreased safety awareness      OT Treatment/Interventions: Self-care/ADL training;DME and/or AE instruction;Therapeutic activities;Patient/family education;Balance training    OT Goals(Current goals can  be found in the care plan section) Acute Rehab OT Goals OT Goal Formulation: With patient Time For Goal Achievement: 01/30/21 Potential to Achieve Goals: Good ADL Goals Pt Will Perform Grooming: (P) with set-up;sitting (w/c at sink) Pt Will Perform Lower Body Bathing: (P) with set-up;sitting/lateral leans Pt Will Perform Lower Body Dressing: (P) with set-up;sitting/lateral leans Pt Will Transfer to Toilet: (P) with transfer board;bedside commode;with modified independence Pt Will Perform Toileting - Clothing Manipulation and hygiene: (P) with modified independence;sitting/lateral leans  OT Frequency: Min 2X/week   Barriers to D/C:            Co-evaluation PT/OT/SLP Co-Evaluation/Treatment: Yes Reason for Co-Treatment: For patient/therapist safety PT goals addressed during session: Mobility/safety with mobility OT goals addressed during session: ADL's and self-care      AM-PAC OT "6 Clicks" Daily Activity     Outcome Measure Help from another person eating meals?: None Help from another person taking care of personal grooming?: A Little Help from another person toileting, which includes using toliet, bedpan, or urinal?: A Lot Help from another person bathing (including washing, rinsing, drying)?: A Lot Help from another person to put on and taking off regular upper body clothing?: A Little Help from another person to put on and taking off regular lower body clothing?: A Lot 6 Click Score: 16  End of Session Nurse Communication: Mobility status;Need for lift equipment  Activity Tolerance: Patient tolerated treatment well Patient left: in chair;with call bell/phone within reach;with chair alarm set  OT Visit Diagnosis: Muscle weakness (generalized) (M62.81);Other symptoms and signs involving cognitive function                Time: 7824-2353 OT Time Calculation (min): 25 min Charges:  OT General Charges $OT Visit: 1 Visit OT Evaluation $OT Eval Moderate Complexity: 1  Mod  Nestor Lewandowsky, OTR/L Acute Rehabilitation Services Pager: [phone] Office: [phone]  Malka So 01/16/2021, 12:28 PM

## 2021-01-16 NOTE — Progress Notes (Signed)
Inpatient Rehab Admissions Coordinator:   Pt. Was is now agreeable to CIR, states that he has 24/7 support from his ex-wife at home. I will open a case for insurance auth once PT and OT get their notes in today and pursue for admission pending insurance auth and bed availability.  Clemens Catholic, [place], Protection Admissions Coordinator  [phone] ([place]) [phone] (office)

## 2021-01-16 NOTE — Progress Notes (Signed)
Subjective:  Doing well denies any chest pain or shortness of breath.  Denies palpitation lightheadedness or syncope.  No further V. tach noted.  Tolerated hemodialysis yesterday well.  Leaning towards proceeding with AV fistula.  Objective:  Vital Signs in the last 24 hours: Temp:  [97.3 F (36.3 C)-99.1 F (37.3 C)] 98.8 F (37.1 C) (11/11 0400) Pulse Rate:  [49-72] 60 (11/11 1000) Resp:  [0-22] 15 (11/11 1000) BP: (114-137)/(54-83) 119/61 (11/11 1000) SpO2:  [73 %-100 %] 98 % (11/11 1000) Weight:  [97.7 kg-98.5 kg] 97.7 kg (11/11 0500)  Intake/Output from previous day: 11/10 0701 - 11/11 0700 In: 813.8 [P.O.:730; I.V.:83.8] Out: 1775 [Urine:275] Intake/Output from this shift: No intake/output data recorded.  Physical Exam: Neck: no adenopathy, no carotid bruit, no JVD, and supple, symmetrical, trachea midline Lungs: clear to auscultation bilaterally Heart: regular rate and rhythm, S1, S2 normal, and 2/6 systolic murmur noted Abdomen: soft, non-tender; bowel sounds normal; no masses,  no organomegaly Extremities: Bilateral BKA noted  Lab Results: Recent Labs    01/15/21 0129 01/16/21 0301  WBC 8.6 8.0  HGB 9.6* 9.5*  PLT 80* 90*   Recent Labs    01/15/21 0129 01/16/21 0301  NA 134* 133*  K 4.1 3.7  CL 98 97*  CO2 23 24  GLUCOSE 118* 159*  BUN 50* 45*  CREATININE 5.31* 5.15*   No results for input(s): TROPONINI in the last 72 hours.  Invalid input(s): CK, MB Hepatic Function Panel Recent Labs    01/16/21 0301  PROT 5.5*  ALBUMIN 2.7*  AST 723*  ALT 699*  ALKPHOS 98  BILITOT 3.5*   No results for input(s): CHOL in the last 72 hours. No results for input(s): PROTIME in the last 72 hours.  Imaging: IR Fluoro Guide CV Line Right  Result Date: 01/14/2021 INDICATION: Acute on chronic kidney injury EXAM: Ultrasound and fluoroscopy guided tunneled dialysis catheter placement MEDICATIONS: Ancef 2 gm IV; The antibiotic was administered within an  appropriate time interval prior to skin puncture. ANESTHESIA/SEDATION: Moderate (conscious) sedation was employed during this procedure. A total of Versed 1 mg was administered intravenously by the radiology nurse. Total intra-service moderate Sedation Time: 11 minutes. The patient's level of consciousness and vital signs were monitored continuously by radiology nursing throughout the procedure under my direct supervision. FLUOROSCOPY TIME:  Fluoroscopy Time: 0 minutes 36 seconds (24 mGy). COMPLICATIONS: None immediate. PROCEDURE: Informed written consent was obtained from the patient after a thorough discussion of the procedural risks, benefits and alternatives. All questions were addressed. Maximal Sterile Barrier Technique was utilized including caps, mask, sterile gowns, sterile gloves, sterile drape, hand hygiene and skin antiseptic. A timeout was performed prior to the initiation of the procedure. The right internal jugular vein was evaluated with ultrasound and shown to be patent. A permanent ultrasound image was obtained and placed in the patient's medical record. Using sterile gel and a sterile probe cover, the right internal jugular vein was entered with a 21 ga needle during real time ultrasound guidance. 0.018 inch guidewire placed and 21 ga needle exchanged for transitional dilator set. Utilizing fluoroscopy, 0.035 inch guidewire advanced through the needle without difficulty. Seriel dilation was performed and peel-away sheath was placed. Attention then turned to the right anterior upper chest. Following local lidocaine administration, the hemodialysis catheter was tunneled from the chest wall to the venotomy site. The catheter was inserted through the peel-away sheath. The tip of the catheter was positioned within the right atrium using fluoroscopic guidance. All  lumens of the catheter aspirated and flushed well. The dialysis lumens were locked with Heparin. The catheter was secured to the skin with  suture. The insertion site was covered with sterile dressing. IMPRESSION: 19 cm tunneled right hemodialysis catheter is ready for use. Electronically Signed   By: Miachel Roux M.D.   On: 01/14/2021 15:25   IR US Guide Vasc Access Right  Result Date: 01/14/2021 INDICATION: Acute on chronic kidney injury EXAM: Ultrasound and fluoroscopy guided tunneled dialysis catheter placement MEDICATIONS: Ancef 2 gm IV; The antibiotic was administered within an appropriate time interval prior to skin puncture. ANESTHESIA/SEDATION: Moderate (conscious) sedation was employed during this procedure. A total of Versed 1 mg was administered intravenously by the radiology nurse. Total intra-service moderate Sedation Time: 11 minutes. The patient's level of consciousness and vital signs were monitored continuously by radiology nursing throughout the procedure under my direct supervision. FLUOROSCOPY TIME:  Fluoroscopy Time: 0 minutes 36 seconds (24 mGy). COMPLICATIONS: None immediate. PROCEDURE: Informed written consent was obtained from the patient after a thorough discussion of the procedural risks, benefits and alternatives. All questions were addressed. Maximal Sterile Barrier Technique was utilized including caps, mask, sterile gowns, sterile gloves, sterile drape, hand hygiene and skin antiseptic. A timeout was performed prior to the initiation of the procedure. The right internal jugular vein was evaluated with ultrasound and shown to be patent. A permanent ultrasound image was obtained and placed in the patient's medical record. Using sterile gel and a sterile probe cover, the right internal jugular vein was entered with a 21 ga needle during real time ultrasound guidance. 0.018 inch guidewire placed and 21 ga needle exchanged for transitional dilator set. Utilizing fluoroscopy, 0.035 inch guidewire advanced through the needle without difficulty. Seriel dilation was performed and peel-away sheath was placed. Attention then  turned to the right anterior upper chest. Following local lidocaine administration, the hemodialysis catheter was tunneled from the chest wall to the venotomy site. The catheter was inserted through the peel-away sheath. The tip of the catheter was positioned within the right atrium using fluoroscopic guidance. All lumens of the catheter aspirated and flushed well. The dialysis lumens were locked with Heparin. The catheter was secured to the skin with suture. The insertion site was covered with sterile dressing. IMPRESSION: 19 cm tunneled right hemodialysis catheter is ready for use. Electronically Signed   By: Miachel Roux M.D.   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  LOS: 5 days    Charolette Forward 01/16/2021, 10:49 AM

## 2021-01-16 NOTE — TOC Progression Note (Signed)
Transition of Care [facility]) - Progression Note    Patient Details  Name: [name] MRN: [id number] Date of Birth: 1950/08/17  [facility] CM/SW Contact  Graves-Bigelow, Ocie Cornfield, RN Phone Number: 01/16/2021, 2:50 PM  Clinical Narrative:   Patient is now agreeable to inpatient rehab. Inpatient Rehab Coordinator is following the patient. Case Manager will continue to follow the patient for additional needs.   Expected Discharge Plan: IP Rehab Facility Barriers to Discharge: Continued Medical Work up  Expected Discharge Plan and Services Expected Discharge Plan: [place] In-house Referral: Hospice / Palliative Care Discharge Planning Services: CM Consult Post Acute Care Choice: [place] arrangements for the past 2 months: Single Family Home    Readmission Risk Interventions Readmission Risk Prevention Plan 01/15/2021 02/02/2019  Transportation Screening Complete Complete  PCP or Specialist Appt within 5-7 Days - Complete  PCP or Specialist Appt within 3-5 Days Complete -  Home Care Screening - Complete  Medication Review (RN CM) - Complete  HRI or Home Care Consult Complete -  Social Work Consult for Recovery Care Planning/Counseling Complete -  Palliative Care Screening Complete -  Medication Review [employer]) Complete -  Some recent data might be hidden

## 2021-01-16 NOTE — Progress Notes (Signed)
[facility] Progress Note  Name: KHYRON GARNO MRN: [id number] DOB: [birth year]  Chief Complaint:  Shortness of breath  Subjective:  He had oozing from site of RIJ tunn catheter and IR placed sutures with resolution. He had 275 mL UOP over 11/10. Had HD on 11/10 with 1.5 kg UF.  We discussed a fistula again today and he states that he is "tired of being cut on".  Discussed risks of dialysis via a catheter.   Review of systems:   Denies shortness of breath No chest pain  No n/v  Intake/Output Summary (Last 24 hours) at 01/16/2021 0547 Last data filed at 01/16/2021 0400 Gross per 24 hour  Intake 819.28 ml  Output 1775 ml  Net -955.72 ml    Vitals:  Vitals:   01/16/21 0100 01/16/21 0200 01/16/21 0300 01/16/21 0400  BP:  120/83 124/64 (!) 118/58  Pulse: (!) 55 62 61 (!) 58  Resp: 20 (!) 0 (!) 8   Temp:      TempSrc:      SpO2: (!) 73% 97% 99% 98%  Weight:      Height:         Physical Exam:   General adult male in bed in no acute distress at rest HEENT normocephalic atraumatic extraocular movements intact sclera anicteric Neck supple trachea midline Lungs reduced on auscultation bilaterally normal work of breathing at rest  Heart S1S2 no rub Abdomen soft nontender nondistended Extremities sacral and thigh edema bilaterally; bilateral BKA Psych normal mood and affect Neuro awake and conversant; follows command and provides hx Access RIJ tunn catheter in place; dressing clean   Medications reviewed   Labs:  BMP Latest Ref Rng & Units 01/16/2021 01/15/2021 01/14/2021  Glucose 70 - 99 mg/dL 159(H) 118(H) 158(H)  BUN 8 - 23 mg/dL 45(H) 50(H) 57(H)  Creatinine 0.61 - 1.24 mg/dL 5.15(H) 5.31(H) 5.84(H)  BUN/Creat Ratio 10 - 24 - - -  Sodium 135 - 145 mmol/L 133(L) 134(L) 136  Potassium 3.5 - 5.1 mmol/L 3.7 4.1 4.5  Chloride 98 - 111 mmol/L 97(L) 98 98  CO2 22 - 32 mmol/L 24 23 23   Calcium 8.9 - 10.3 mg/dL 8.0(L) 8.5(L) 9.2     Assessment/Plan:   #  AKI  - may be secondary to cardiorenal vs CKD progression.  He states that he has changed his mind about dialysis, now having symptoms and wants to be here for grandchildren; minimal response to diuretics. S/p tunneled catheter with IR on 11/9 followed by first HD treatment - No acute indication for HD today.  Anticipate next tx on 11/12 - Appreciate palliative care and critical care teams - I have requested CLIP as AKI (may need to change to ESRD) - revisit AVF discussions tomorrow - he ended up saying that I could call vascular today however he seemed disgruntled about the same so I will discuss with him again tomorrow and defer calling today   # CKD stage IV - baseline reported as 2.7-3.2 per last note - follows with Dr. Candiss Norse; has previously stated that he would never want HD as an outpatient per charting but has changed his mind as above  # Hypokalemia - now resolved and managed with HD - normally on lokelma 10 gram BID at home (recently increased to BID dosing) - now on hold - on renal diet   # HFrEF   - diuretics/volume management as above - now transitioned to HD to optimize volume status   # NSVT -  on amio; per primary    # Acute hypoxic resp failure  - optimize volume status - HD as above  # HTN  - acceptable control    # Normocytic anemia  - secondary to ckd - mild - no PRBC indicated - on retacrit 5,000 units monthly as outpatient - last rec'd 12/23/20 - redosed ESA - aranesp 40 mcg once on 59/27  # Metabolic acidosis - Setting of worsening renal failure but also note elevated lactic acid - managed now with HD  # Transaminitis  - setting of critical illness, overload - improved - Per primary team   # Thrombocytopenia - per primary team  - No heparin with HD - improving - noted he is on dvt ppx with heparin   Claudia Desanctis, MD 01/16/2021 6:04 AM

## 2021-01-16 NOTE — Progress Notes (Addendum)
Awaiting acceptance/schedule from [facility] admissions. Will advise pt and providers of arrangements once known. D/C plan of CIR noted. Will follow and assist.   Melven Sartorius Renal Navigator [phone]  Addendum at 5:16pm: Pt has been accepted at [facility] on TTS schedule. Pt will need to arrive at 11:10 for 11:30 chair time. For pt's first appointment, pt will need to arrive at 10:30 to complete paperwork prior to treatment. Attempted to reach pt via phone to discuss details. Message left with basic information. Will f/u with pt on Monday. Will advise clinic of pt's d/c date once known.

## 2021-01-17 ENCOUNTER — Inpatient Hospital Stay (HOSPITAL_COMMUNITY): Payer: Medicare Other

## 2021-01-17 DIAGNOSIS — Z992 Dependence on renal dialysis: Secondary | ICD-10-CM

## 2021-01-17 DIAGNOSIS — Z7982 Long term (current) use of aspirin: Secondary | ICD-10-CM

## 2021-01-17 DIAGNOSIS — N186 End stage renal disease: Secondary | ICD-10-CM

## 2021-01-17 DIAGNOSIS — Z7902 Long term (current) use of antithrombotics/antiplatelets: Secondary | ICD-10-CM

## 2021-01-17 LAB — RENAL FUNCTION PANEL
Albumin: 2.9 g/dL — ABNORMAL LOW (ref 3.5–5.0)
Anion gap: 12 (ref 5–15)
BUN: 56 mg/dL — ABNORMAL HIGH (ref 8–23)
CO2: 22 mmol/L (ref 22–32)
Calcium: 8 mg/dL — ABNORMAL LOW (ref 8.9–10.3)
Chloride: 94 mmol/L — ABNORMAL LOW (ref 98–111)
Creatinine, Ser: 6.56 mg/dL — ABNORMAL HIGH (ref 0.61–1.24)
GFR, Estimated: 8 mL/min — ABNORMAL LOW (ref >60)
Glucose, Bld: 162 mg/dL — ABNORMAL HIGH (ref 70–99)
Phosphorus: 4.2 mg/dL (ref 2.5–4.6)
Potassium: 3.6 mmol/L (ref 3.5–5.1)
Sodium: 128 mmol/L — ABNORMAL LOW (ref 135–145)

## 2021-01-17 LAB — CBC
HCT: 27 % — ABNORMAL LOW (ref 39.0–52.0)
Hemoglobin: 8.9 g/dL — ABNORMAL LOW (ref 13.0–17.0)
MCH: 29.9 pg (ref 26.0–34.0)
MCHC: 33 g/dL (ref 30.0–36.0)
MCV: 90.6 fL (ref 80.0–100.0)
Platelets: 78 10*3/uL — ABNORMAL LOW (ref 150–400)
RBC: 2.98 MIL/uL — ABNORMAL LOW (ref 4.22–5.81)
RDW: 16.6 % — ABNORMAL HIGH (ref 11.5–15.5)
WBC: 8 10*3/uL (ref 4.0–10.5)
nRBC: 9.3 % — ABNORMAL HIGH (ref 0.0–0.2)

## 2021-01-17 LAB — GLUCOSE, CAPILLARY
Glucose-Capillary: 148 mg/dL — ABNORMAL HIGH (ref 70–99)
Glucose-Capillary: 132 mg/dL — ABNORMAL HIGH (ref 70–99)
Glucose-Capillary: 142 mg/dL — ABNORMAL HIGH (ref 70–99)
Glucose-Capillary: 120 mg/dL — ABNORMAL HIGH (ref 70–99)

## 2021-01-17 LAB — TRANSFERRIN: Transferrin: 147 mg/dL — ABNORMAL LOW (ref 180–329)

## 2021-01-17 MED ORDER — HEPARIN SODIUM (PORCINE) 1000 UNIT/ML IJ SOLN: 3200.0000 [IU] | INTRAMUSCULAR | Status: DC | PRN

## 2021-01-17 MED ORDER — NEPRO/CARBSTEADY PO LIQD
237.0000 mL | Freq: Two times a day (BID) | ORAL | Status: DC
  Administered 2021-01-17 – 2021-01-25 (×11): 237 mL via ORAL

## 2021-01-17 MED ORDER — PROSOURCE PLUS PO LIQD
30.0000 mL | Freq: Two times a day (BID) | ORAL | Status: DC
  Administered 2021-01-17 – 2021-01-25 (×12): 30 mL via ORAL
  Filled 2021-01-17 (×13): qty 30

## 2021-01-17 MED ORDER — RENA-VITE PO TABS
1.0000 | ORAL_TABLET | Freq: Every day | ORAL | Status: DC
  Administered 2021-01-17 – 2021-01-24 (×8): 1 via ORAL
  Filled 2021-01-17 (×8): qty 1

## 2021-01-17 NOTE — Progress Notes (Signed)
VASCULAR LAB    Upper extremity vein mapping has been performed.  See CV proc for preliminary results.   Venora Kautzman, RVT 01/17/2021, 2:43 PM

## 2021-01-17 NOTE — Progress Notes (Signed)
Subjective:  Doing well denies any chest pain or shortness of breath tolerating hemodialysis.  No active cardiac issues no further V. tach  Objective:  Vital Signs in the last 24 hours: Temp:  [97.9 F (36.6 C)-99.2 F (37.3 C)] 98.7 F (37.1 C) (11/12 0732) Pulse Rate:  [58-63] 59 (11/12 1000) Resp:  [9-19] 15 (11/12 0930) BP: (102-120)/(49-70) 109/56 (11/12 1000) SpO2:  [82 %-98 %] 82 % (11/12 0248) Weight:  [100.5 kg-101.6 kg] 100.5 kg (11/12 0716)  Intake/Output from previous day: 11/11 0701 - 11/12 0700 In: 360 [P.O.:360] Out: -  Intake/Output from this shift: No intake/output data recorded.  Physical Exam: Exam unchanged  Lab Results: Recent Labs    01/16/21 0301 01/17/21 0735  WBC 8.0 8.0  HGB 9.5* 8.9*  PLT 90* 78*   Recent Labs    01/16/21 0301 01/17/21 0735  NA 133* 128*  K 3.7 3.6  CL 97* 94*  CO2 24 22  GLUCOSE 159* 162*  BUN 45* 56*  CREATININE 5.15* 6.56*   No results for input(s): TROPONINI in the last 72 hours.  Invalid input(s): CK, MB Hepatic Function Panel Recent Labs    01/16/21 0301 01/17/21 0735  PROT 5.5*  --   ALBUMIN 2.7* 2.9*  AST 723*  --   ALT 699*  --   ALKPHOS 98  --   BILITOT 3.5*  --    No results for input(s): CHOL in the last 72 hours. No results for input(s): PROTIME in the last 72 hours.  Imaging: No results found.  Cardiac Studies:  Assessment/Plan:  Compensated systolic congestive heart failure Mildly elevated high-sensitivity troponin high the setting of acute congestive heart failure/chronic kidney disease doubt significant MI Coronary artery disease history of MI x2 in the past status post CABG in remote past Ischemic cardiomyopathy Valvular heart disease Status postNonsustained VT in this setting of electrolyte abnormalities and metabolic acidosis/questionable ischemia Hypertension Diabetes mellitus Peripheral vascular disease status post bilateral below-knee amputation Acute on chronic kidney  disease stage IV now hemodialysis dependent GERD History of CVA in the past Status post hypokalemia Anemia of chronic disease Status post Metabolic acidosis Plan Continue present management I will sign off please call if needed  LOS: 6 days    Charolette Forward 01/17/2021, 10:44 AM

## 2021-01-17 NOTE — Consult Note (Signed)
Hospital Consult    Reason for Consult:  permanent hd access Referring Physician:  Dr. Royce Macadamia MRN #:  [id number]  History of Present Illness: This is a 70 y.o. male left-hand-dominant gentleman now on dialysis via tunneled dialysis catheter.  Per nephrology will be at least CKD 5 but currently requiring dialysis.  We are consulted for permanent access.  He denies any previous upper extremity surgeries.  He does have a previous stroke but no upper extremity limitations from this.  Denies any previous pacemakers or ports other than current catheter in the right IJ.  He is on aspirin Plavix he denies use of any blood thinners.  Past Medical History:  Diagnosis Date   CHF (congestive heart failure) (HCC)    Chronic Kidney Disease IV    Exertional shortness of breath    "before my last OR; I'm fine now" (09/26/2012)   GERD (gastroesophageal reflux disease)    High cholesterol    Hx of seasonal allergies    Hypertension    Myocardial infarction [facility])    Scrotal edema 01/30/2019   Stroke ([place]) 2009   memory loss   Type II diabetes mellitus ([place])     Past Surgical History:  Procedure Laterality Date   AMPUTATION Right 07/29/2012   Procedure: AMPUTATION 3RD TOE;  Surgeon: Kerin Salen, MD;  Location: [place];  Service: Orthopedics;  Laterality: Right;  right third toe amputation   AMPUTATION Right 08/02/2012   Procedure: right transmetatarsal amputation;  Surgeon: Kerin Salen, MD;  Location: [place];  Service: Orthopedics;  Laterality: Right;   AMPUTATION Right 09/06/2012   Procedure: AMPUTATION BELOW KNEE;  Surgeon: Kerin Salen, MD;  Location: [place];  Service: Orthopedics;  Laterality: Right;   AMPUTATION Left 09/27/2012   Procedure: AMPUTATION BELOW KNEE;  Surgeon: Kerin Salen, MD;  Location: [place];  Service: Orthopedics;  Laterality: Left;   CATARACT EXTRACTION W/ INTRAOCULAR LENS  IMPLANT, BILATERAL  ?2010   CORONARY ARTERY BYPASS GRAFT  2008   CABG X4   ESOPHAGOGASTRODUODENOSCOPY  N/A 10/08/2012   Procedure: ESOPHAGOGASTRODUODENOSCOPY (EGD);  Surgeon: Winfield Cunas., MD;  Location[facility] ENDOSCOPY;  Service: Endoscopy;  Laterality: N/A;   IR FLUORO GUIDE CV LINE RIGHT  01/14/2021   IR US GUIDE VASC ACCESS RIGHT  01/14/2021   PERIPHERALLY INSERTED CENTRAL CATHETER INSERTION Right 09/2012   upper arm    No Known Allergies  Prior to Admission medications   Medication Sig Start Date End Date Taking? Authorizing Provider  aspirin EC 81 MG tablet Take 81 mg by mouth every morning.   Yes [provider]  atorvastatin (LIPITOR) 40 MG tablet Take 1 tablet (40 mg total) by mouth every morning. Patient taking differently: Take 40 mg by mouth daily. 08/23/18  Yes Mariel Aloe, MD  Cyanocobalamin (VITAMIN B-12 PO) Take 1 tablet by mouth daily.   Yes [provider]  furosemide (LASIX) 80 MG tablet Take 1 tablet (80 mg total) by mouth 2 (two) times daily. 04/17/19 01/11/21 Yes Al Decant, MD  gabapentin (NEURONTIN) 300 MG capsule Take 1 capsule (300 mg total) by mouth at bedtime. 01/08/21  Yes Katsadouros, Vasilios, MD  hydrALAZINE (APRESOLINE) 25 MG tablet TAKE 1 TABLET BY MOUTH THREE TIMES A DAY Patient taking differently: Take 25 mg by mouth 3 (three) times daily. 12/26/20  Yes Virl Axe, MD  isosorbide mononitrate (IMDUR) 30 MG 24 hr tablet Take 1 tablet (30 mg total) by mouth daily. 06/26/20  Yes Truman Hayward,  Dimple Nanas, MD  metoprolol succinate (TOPROL-XL) 50 MG 24 hr tablet Take 50 mg by mouth every morning. 05/31/18  Yes [provider]  nitroGLYCERIN (NITROSTAT) 0.4 MG SL tablet Place 0.4 mg under the tongue every 5 (five) minutes as needed for chest pain.   Yes [provider]  pantoprazole (PROTONIX) 40 MG tablet Take 1 tablet (40 mg total) by mouth daily at 6 (six) AM. 12/21/18  Yes Charolette Forward, MD  sodium bicarbonate 650 MG tablet Take 650 mg by mouth 2 (two) times daily. 10/31/20  Yes [provider]  sodium zirconium  cyclosilicate (LOKELMA) 10 g PACK packet Take 10 g by mouth 2 (two) times daily.   Yes [provider]  tamsulosin (FLOMAX) 0.4 MG CAPS capsule Take 0.4 mg by mouth every morning. 06/11/18  Yes [provider]    Social History   Socioeconomic History   Marital status: Legally Separated    Spouse name: Not on file   Number of children: Not on file   Years of education: Not on file   Highest education level: Not on file  Occupational History   Not on file  Tobacco Use   Smoking status: Former    Packs/day: 0.12    Years: 20.00    Pack years: 2.40    Types: Cigarettes   Smokeless tobacco: Never  Vaping Use   Vaping Use: Never used  Substance and Sexual Activity   Alcohol use: No    Comment: last drink 2010   Drug use: No   Sexual activity: Never  Other Topics Concern   Not on file  Social History Narrative   Not on file   Social Determinants of Health   Financial Resource Strain: Not on file  Food Insecurity: Not on file  Transportation Needs: Not on file  Physical Activity: Not on file  Stress: Not on file  Social Connections: Not on file  Intimate Partner Violence: Not on file     No family history on file.  Review of Systems  Constitutional: Negative.   HENT: Negative.    Eyes: Negative.   Respiratory: Negative.    Cardiovascular: Negative.   Gastrointestinal: Negative.   Musculoskeletal: Negative.   Skin: Negative.   Neurological: Negative.   Endo/Heme/Allergies: Negative.   Psychiatric/Behavioral: Negative.       Physical Examination  Vitals:   01/17/21 1038 01/17/21 1141  BP: (!) 109/59 (!) 124/59  Pulse: 60 62  Resp: 16 16  Temp: 98.6 F (37 C) 98.9 F (37.2 C)  SpO2: 94% 98%   Body mass index is 33.22 kg/m. Physical Exam Constitutional:      Appearance: Normal appearance.  HENT:     Head: Normocephalic.     Nose: Nose normal.  Eyes:     Extraocular Movements: Extraocular movements intact.     Pupils: Pupils are  equal, round, and reactive to light.  Neck:     Comments: There is a tunneled catheter to the right neck and chest Cardiovascular:     Pulses:          Radial pulses are 2+ on the right side and 2+ on the left side.  Pulmonary:     Effort: Pulmonary effort is normal.  Abdominal:     General: Abdomen is flat.     Palpations: Abdomen is soft.  Musculoskeletal:        General: Normal range of motion.     Right lower leg: No edema.  Left lower leg: No edema.  Skin:    General: Skin is warm and dry.     Capillary Refill: Capillary refill takes less than 2 seconds.  Neurological:     General: No focal deficit present.     Mental Status: He is alert.  Psychiatric:        Mood and Affect: Mood normal.        Behavior: Behavior normal.        Thought Content: Thought content normal.        Judgment: Judgment normal.     CBC    Component Value Date/Time   WBC 8.0 01/17/2021 0735   RBC 2.98 (L) 01/17/2021 0735   HGB 8.9 (L) 01/17/2021 0735   HGB 9.4 (L) 02/06/2019 1016   HCT 27.0 (L) 01/17/2021 0735   HCT 28.4 (L) 02/06/2019 1016   PLT 78 (L) 01/17/2021 0735   PLT 199 02/06/2019 1016   MCV 90.6 01/17/2021 0735   MCV 86 02/06/2019 1016   MCH 29.9 01/17/2021 0735   MCHC 33.0 01/17/2021 0735   RDW 16.6 (H) 01/17/2021 0735   RDW 18.5 (H) 02/06/2019 1016   LYMPHSABS 1.5 01/11/2021 0851   LYMPHSABS 1.3 01/29/2019 1033   MONOABS 0.7 01/11/2021 0851   EOSABS 0.2 01/11/2021 0851   EOSABS 0.1 01/29/2019 1033   BASOSABS 0.0 01/11/2021 0851   BASOSABS 0.0 01/29/2019 1033    BMET    Component Value Date/Time   NA 128 (L) 01/17/2021 0735   NA 135 07/31/2019 1433   K 3.6 01/17/2021 0735   CL 94 (L) 01/17/2021 0735   CO2 22 01/17/2021 0735   GLUCOSE 162 (H) 01/17/2021 0735   BUN 56 (H) 01/17/2021 0735   BUN 35 (H) 07/31/2019 1433   CREATININE 6.56 (H) 01/17/2021 0735   CALCIUM 8.0 (L) 01/17/2021 0735   GFRNONAA 8 (L) 01/17/2021 0735   GFRAA 19 (L) 10/12/2019 1548     COAGS: Lab Results  Component Value Date   INR 1.32 09/06/2012   INR 1.4 01/02/2007     Non-Invasive Vascular Imaging:   Vein mapping ordered  ASSESSMENT/PLAN: This is a 70 y.o. male in need of permanent dialysis access currently considered CKD 5 acute on chronic renal failure.  He does have tunneled dialysis catheter for access now.  He is left-hand dominant.  Vein mapping has been ordered.  We will plan for permanent access likely in his right upper extremity pending vein map results.  I discussed this with the patient and we will further discuss once the vein mapping has been performed.  [name] C. Donzetta Matters, MD [facility] Office: [phone] Pager: [phone]

## 2021-01-17 NOTE — Progress Notes (Signed)
Initial Nutrition Assessment  DOCUMENTATION CODES:   Obesity unspecified  INTERVENTION:  -Nepro Shake po BID, each supplement provides 425 kcal and 19 grams protein -PROSource PLUS PO 68mls BID, each supplement provides 100 kcals and 15 grams of protein -renavite daily -Renal diet education provided, RD will follow-up for reinforcement   NUTRITION DIAGNOSIS:   Increased nutrient needs related to chronic illness (ESRD on HD) as evidenced by estimated needs.   GOAL:   Patient will meet greater than or equal to 90% of their needs   MONITOR:   PO intake, Supplement acceptance, Labs, Weight trends, I & O's  REASON FOR ASSESSMENT:   Consult Diet education  ASSESSMENT:   Pt with  pertinent PMH of hypertension, type 2 diabetes, PAD status post bilateral BKA, CAD status post CABG, CKD stage V, HFrEF, who presented with shortness of breath and bilateral leg swelling and admitted for acute on chronic HFrEF complicated by cardiorenal syndrome with progression to ESRD now on HD.  Pt unavailable at time of RD visit x2. Pt in HD per RN. RD will follow-up to attempt education.  Weight history reviewed. No significant weight loss noted but fluid may be masking weight loss.  Last HD 11/10 net UF1.5L Post HD wt 98.5 kg  PreHD wt today 100.5 kg  PO Intake: 0-100% x last 8 recorded meals (38% avg meal intake)   No UOP documented x24 hours I/O: +562ml since admit  Edema: mild pitting edema to BLE and perineal area per RN assessment  Medications: Scheduled Meds:  amiodarone  200 mg Oral BID   aspirin EC  81 mg Oral q morning   atorvastatin  40 mg Oral q morning   Chlorhexidine Gluconate Cloth  6 each Topical Q0600   Chlorhexidine Gluconate Cloth  6 each Topical Q0600   clopidogrel  75 mg Oral Daily   gabapentin  300 mg Oral QHS   hydrALAZINE  25 mg Oral TID   insulin aspart  0-15 Units Subcutaneous TID WC   isosorbide mononitrate  30 mg Oral Daily   metoprolol succinate  50  mg Oral q morning   pantoprazole  40 mg Oral Q0600   tamsulosin  0.4 mg Oral q morning  Continuous Infusions:  sodium chloride Stopped (01/13/21 1834)    Labs: Recent Labs  Lab 01/13/21 0217 01/13/21 1454 01/14/21 0119 01/15/21 0129 01/16/21 0301 01/17/21 0735  NA 139   < > 136 134* 133* 128*  K 4.4   < > 4.5 4.1 3.7 3.6  CL 100   < > 98 98 97* 94*  CO2 14*   < > 23 23 24 22   BUN 47*   < > 57* 50* 45* 56*  CREATININE 5.25*   < > 5.84* 5.31* 5.15* 6.56*  CALCIUM 9.0   < > 9.2 8.5* 8.0* 8.0*  MG 2.6*  --  2.7* 2.4  --   --   PHOS  --   --  5.1* 4.0  --  4.2  GLUCOSE 189*   < > 158* 118* 159* 162*   < > = values in this interval not displayed.      NUTRITION - FOCUSED PHYSICAL EXAM: Unable to perform at this time. Will attempt at follow-up.  Diet Order:   Diet Order             Diet renal/carb modified with fluid restriction Diet-HS Snack? Nothing; Fluid restriction: 1500 mL Fluid; Room service appropriate? Yes; Fluid consistency: Thin  Diet effective now  EDUCATION NEEDS:   Education needs have been addressed  Skin:  Skin Assessment: Skin Integrity Issues: Skin Integrity Issues:: Incisions Incisions: throat  Last BM:  PTA  Height:   Ht Readings from Last 1 Encounters:  01/11/21 5\' 8"  (1.727 m)    Weight:   Wt Readings from Last 1 Encounters:  01/17/21 100.5 kg      BMI:  Body mass index is 33.69 kg/m.  Estimated Nutritional Needs:   Kcal:  2400-2600  Protein:  120-140 grams  Fluid:  1L+UOP     Theone Stanley., MS, RD, LDN (she/her/hers) RD pager number and weekend/on-call pager number located in [place].

## 2021-01-17 NOTE — Progress Notes (Signed)
Brief Nutrition Note  Consult received for diet education. Pt unavailable at time of first attempt, education attached to d/c instructions. RD to re-attempt education. Full assessment to follow.  Admitting Dx: Hypokalemia [E87.6] Acute on chronic heart failure (HCC) [I50.9] Acute kidney injury ([place]) [N17.9] Ventricular tachycardia, non-sustained [I47.29] Chronic kidney disease, unspecified CKD stage [N18.9] Acute on chronic congestive heart failure, unspecified heart failure type ([place]) [I50.9]  Body mass index is 33.69 kg/m. Pt meets criteria for obesity based on current BMI.  Labs:  Recent Labs  Lab 01/12/21 1506 01/12/21 1513 01/13/21 0217 01/13/21 1454 01/14/21 0119 01/15/21 0129 01/16/21 0301  NA 140   < > 139   < > 136 134* 133*  K 3.9   < > 4.4   < > 4.5 4.1 3.7  CL 102  --  100   < > 98 98 97*  CO2 21*  --  14*   < > 23 23 24   BUN 45*  --  47*   < > 57* 50* 45*  CREATININE 4.83*  --  5.25*   < > 5.84* 5.31* 5.15*  CALCIUM 8.8*  --  9.0   < > 9.2 8.5* 8.0*  MG 2.6*  --  2.6*  --  2.7* 2.4  --   PHOS 4.9*  --   --   --  5.1* 4.0  --   GLUCOSE 168*  --  189*   < > 158* 118* 159*   < > = values in this interval not displayed.     Theone Stanley., MS, RD, LDN (she/her/hers) RD pager number and weekend/on-call pager number located in [place].

## 2021-01-17 NOTE — Progress Notes (Signed)
[facility] Progress Note  Name: TALI COSTER MRN: [id number] DOB: [birth year]  Chief Complaint:  Shortness of breath  Subjective:  Seen and examined on dialysis.  Procedure supervised.  Blood pressure 116/57 and HR 58 on HD.  Tolerating goal.  HD educator and I both talked with him yesterday about a fistula.  There is no urine output over 11/11 charted. He is willing to get a fistula because he states "I see that you all are going to keep bothering me about it".  He isn't excited about the procedure but is willing to have it done.  Wants to be here for his grandchildren and we talked about higher risk of bacteremia with a catheter.  Review of systems:   Denies shortness of breath No chest pain  No n/v  Intake/Output Summary (Last 24 hours) at 01/17/2021 0830 Last data filed at 01/16/2021 1816 Gross per 24 hour  Intake 360 ml  Output --  Net 360 ml    Vitals:  Vitals:   01/17/21 0629 01/17/21 0716 01/17/21 0732 01/17/21 0800  BP:  (!) 102/58 (!) 116/59 (!) 116/57  Pulse:      Resp:  19 12   Temp:  98.7 F (37.1 C) 98.7 F (37.1 C)   TempSrc:  Axillary    SpO2:      Weight: 101.6 kg 100.5 kg    Height:         Physical Exam:   General adult male in bed in no acute distress at rest HEENT normocephalic atraumatic extraocular movements intact sclera anicteric Neck supple trachea midline Lungs reduced on auscultation bilaterally normal work of breathing at rest  Heart S1S2 no rub Abdomen soft nontender nondistended Extremities sacral and thigh edema bilaterally; bilateral BKA Psych normal mood and affect Neuro awake and conversant; follows command and provides hx Access RIJ tunn catheter in place; dressing clean   Medications reviewed   Labs:  BMP Latest Ref Rng & Units 01/17/2021 01/16/2021 01/15/2021  Glucose 70 - 99 mg/dL 162(H) 159(H) 118(H)  BUN 8 - 23 mg/dL 56(H) 45(H) 50(H)  Creatinine 0.61 - 1.24 mg/dL 6.56(H) 5.15(H) 5.31(H)  BUN/Creat  Ratio 10 - 24 - - -  Sodium 135 - 145 mmol/L 128(L) 133(L) 134(L)  Potassium 3.5 - 5.1 mmol/L 3.6 3.7 4.1  Chloride 98 - 111 mmol/L 94(L) 97(L) 98  CO2 22 - 32 mmol/L 22 24 23   Calcium 8.9 - 10.3 mg/dL 8.0(L) 8.0(L) 8.5(L)     Assessment/Plan:   # 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  He states that he has changed his mind about dialysis, now having symptoms and wants to be here for grandchildren; minimal response to diuretics. S/p tunneled catheter with IR on 11/9 followed by first HD treatment.  Baseline reported as 2.7-3.2 per last note - No acute indication for HD today.  Anticipate next tx on 11/12 - Appreciate palliative care and critical care teams - I have requested CLIP as AKI (will need to change to ESRD).  I do not feel that he will recover function sufficient to come off of HD - he is willing to have a fistula placed  - order vein mapping and consult vascular   # Hypokalemia - now resolved and managed with HD - normally on lokelma 10 gram BID at home (recently increased to BID dosing) - now on hold - on renal diet   # HFrEF   -  diuretics/volume management as above - now transitioned to HD to optimize volume status   # NSVT - on amio; per primary    # Acute hypoxic resp failure  - optimize volume status - HD as above  # HTN  - acceptable control    # Normocytic anemia  - secondary to ckd - no PRBC indicated - on retacrit 5,000 units monthly as outpatient - last rec'd 12/23/20 - redosed ESA - aranesp 40 mcg once on 46/19  # Metabolic acidosis - Setting of worsening renal failure but also note elevated lactic acid - managed now with HD  # Transaminitis  - setting of critical illness, overload - improved - Per primary team   # Thrombocytopenia - per primary team  - No heparin with HD-  - noted he is on dvt ppx with heparin   Claudia Desanctis, MD 01/17/2021 8:51 AM

## 2021-01-17 NOTE — Progress Notes (Addendum)
HD#6 Subjective:  Overnight Events: No events  Patient was seen at bedside.  He appears comfortable in no acute distress.  He just got back from HD and ultrasound for vein mapping today.  He reports doing well with HD, denies fatigue after suction.  Denies chest pain or shortness of breath.  Report good p.o. intake.  He has no concerns at this time.  Objective:  Vital signs in last 24 hours: Vitals:   01/16/21 1953 01/16/21 2304 01/17/21 0248 01/17/21 0629  BP: 111/63 (!) 114/57 119/62   Pulse: 63 60 61   Resp: 12 12 16    Temp: 97.9 F (36.6 C) 98.2 F (36.8 C) 99.2 F (37.3 C)   TempSrc: Oral Oral Oral   SpO2: 98% 95% (!) 82%   Weight:    101.6 kg  Height:       Supplemental O2: Room Air SpO2: (!) 82 % O2 Flow Rate (L/min): 2 L/min   Physical Exam:  Physical Exam Constitutional:      General: He is not in acute distress.    Appearance: He is not toxic-appearing.  HENT:     Head: Normocephalic.  Eyes:     General:        Right eye: No discharge.        Left eye: No discharge.     Conjunctiva/sclera: Conjunctivae normal.  Neck:     Comments: Right tunneled cath in place.  The site appears clean and noninfected. Cardiovascular:     Rate and Rhythm: Normal rate and regular rhythm.     Heart sounds: Normal heart sounds.  Pulmonary:     Effort: Pulmonary effort is normal. No respiratory distress.  Musculoskeletal:     Comments: Bilateral BKA  Skin:    General: Skin is warm.     Coloration: Skin is not jaundiced.  Neurological:     General: No focal deficit present.     Mental Status: He is alert and oriented to person, place, and time.  Psychiatric:        Mood and Affect: Mood normal.        Behavior: Behavior normal.    Filed Weights   01/15/21 1205 01/16/21 0500 01/17/21 0629  Weight: 98.5 kg 97.7 kg 101.6 kg     Intake/Output Summary (Last 24 hours) at 01/17/2021 0707 Last data filed at 01/16/2021 1816 Gross per 24 hour  Intake 360 ml  Output  --  Net 360 ml   Net IO Since Admission: 517.94 mL [01/17/21 0707]  Pertinent Labs: CBC Latest Ref Rng & Units 01/16/2021 01/15/2021 01/14/2021  WBC 4.0 - 10.5 K/uL 8.0 8.6 8.0  Hemoglobin 13.0 - 17.0 g/dL 9.5(L) 9.6(L) 10.1(L)  Hematocrit 39.0 - 52.0 % 29.1(L) 30.2(L) 32.0(L)  Platelets 150 - 400 K/uL 90(L) 80(L) 83(L)    CMP Latest Ref Rng & Units 01/16/2021 01/15/2021 01/14/2021  Glucose 70 - 99 mg/dL 159(H) 118(H) 158(H)  BUN 8 - 23 mg/dL 45(H) 50(H) 57(H)  Creatinine 0.61 - 1.24 mg/dL 5.15(H) 5.31(H) 5.84(H)  Sodium 135 - 145 mmol/L 133(L) 134(L) 136  Potassium 3.5 - 5.1 mmol/L 3.7 4.1 4.5  Chloride 98 - 111 mmol/L 97(L) 98 98  CO2 22 - 32 mmol/L 24 23 23   Calcium 8.9 - 10.3 mg/dL 8.0(L) 8.5(L) 9.2  Total Protein 6.5 - 8.1 g/dL 5.5(L) 6.1(L) -  Total Bilirubin 0.3 - 1.2 mg/dL 3.5(H) 3.1(H) -  Alkaline Phos 38 - 126 U/L 98 93 -  AST 15 -  41 U/L 723(H) 1,221(H) -  ALT 0 - 44 U/L 699(H) 1,123(H) -    Imaging: No results found.  Assessment/Plan:   Active Problems:   Goals of care, counseling/discussion   Acute on chronic congestive heart failure (HCC)   Ventricular tachycardia, non-sustained   ESRD needing dialysis [facility])   Patient Summary: [name] is a 70 y.o. with a pertinent PMH of hypertension, type 2 diabetes, PAD status post bilateral BKA, CAD status post CABG, CKD stage V, HFrEF, who presented with shortness of breath and bilateral leg swelling and admitted for acute on chronic HFrEF complicated by CKD with progression to ESRD now on HD.   ESRD on HD Recently started hemodialysis.  Patient is willing to have fistula placed.  Vascular was consulted.  She is having mapping today. -Appreciate nephrology's assistance with this patient -Will hold hydralazine due to soft blood pressure during dialysis -Strict I's and O's -Monitor kidney function electrolytes daily -Will need permanent access in the near future/VSS consult    Acute on chronic Systolic HFrEF  (EF 12-24%) Mildly Reduced RV function: Moderate TVR and Mild AS His volume status and respiratory status are stable on exam.  He has had 2 L of UF today. -Appreciate Cardiology and nephrology's assistance -Holding hydralazine for soft blood pressure during dialysis -Continue metoprolol, and Imdur -Will continue to monitor electrolytes daily   Non-sustained VT: No further episodes of VT and tolerating Amio -Continue amiodarone 200 mg p.o. 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   Normocytic anemia Likely anemia of chronic kidney disease.  Ferritin is very elevated at >7500. Will check transferrin to rule out hemachromatosis.  -CBC in a.m.  Thrombocytopenia Platelets dropped > 50% on 11/9, 3-day after starting heparin.  He belongs to the low-intermediate risk of HIT.  No evidence of bleeding or thrombosis on exam.  Will stop heparin and check HIT antibody.  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 No SCDs because bilateral BKAs.  Code: Full AB: none Therapy Recs: CIR, DME: none Family Contact: pending DISPO: Anticipated discharge in 1-2 days to Rehab pending Medical stability.  Gaylan Gerold, DO 01/17/2021, 7:07 AM Pager: [phone]  Please contact the on call pager after 5 pm and on weekends at [phone].

## 2021-01-18 DIAGNOSIS — N186 End stage renal disease: Secondary | ICD-10-CM | POA: Diagnosis not present

## 2021-01-18 DIAGNOSIS — D696 Thrombocytopenia, unspecified: Secondary | ICD-10-CM

## 2021-01-18 DIAGNOSIS — E1122 Type 2 diabetes mellitus with diabetic chronic kidney disease: Secondary | ICD-10-CM | POA: Diagnosis not present

## 2021-01-18 DIAGNOSIS — D649 Anemia, unspecified: Secondary | ICD-10-CM

## 2021-01-18 DIAGNOSIS — Z992 Dependence on renal dialysis: Secondary | ICD-10-CM | POA: Diagnosis not present

## 2021-01-18 DIAGNOSIS — I5023 Acute on chronic systolic (congestive) heart failure: Secondary | ICD-10-CM | POA: Diagnosis not present

## 2021-01-18 DIAGNOSIS — E1151 Type 2 diabetes mellitus with diabetic peripheral angiopathy without gangrene: Secondary | ICD-10-CM

## 2021-01-18 DIAGNOSIS — Z89512 Acquired absence of left leg below knee: Secondary | ICD-10-CM

## 2021-01-18 DIAGNOSIS — I350 Nonrheumatic aortic (valve) stenosis: Secondary | ICD-10-CM

## 2021-01-18 DIAGNOSIS — E876 Hypokalemia: Secondary | ICD-10-CM | POA: Diagnosis not present

## 2021-01-18 DIAGNOSIS — Z89511 Acquired absence of right leg below knee: Secondary | ICD-10-CM

## 2021-01-18 LAB — COMPREHENSIVE METABOLIC PANEL
ALT: 480 U/L — ABNORMAL HIGH (ref 0–44)
AST: 755 U/L — ABNORMAL HIGH (ref 15–41)
Albumin: 2.7 g/dL — ABNORMAL LOW (ref 3.5–5.0)
Alkaline Phosphatase: 100 U/L (ref 38–126)
Anion gap: 12 (ref 5–15)
BUN: 41 mg/dL — ABNORMAL HIGH (ref 8–23)
CO2: 22 mmol/L (ref 22–32)
Calcium: 7.9 mg/dL — ABNORMAL LOW (ref 8.9–10.3)
Chloride: 96 mmol/L — ABNORMAL LOW (ref 98–111)
Creatinine, Ser: 5.28 mg/dL — ABNORMAL HIGH (ref 0.61–1.24)
GFR, Estimated: 11 mL/min — ABNORMAL LOW (ref >60)
Glucose, Bld: 128 mg/dL — ABNORMAL HIGH (ref 70–99)
Potassium: 3.6 mmol/L (ref 3.5–5.1)
Sodium: 130 mmol/L — ABNORMAL LOW (ref 135–145)
Total Bilirubin: 3.6 mg/dL — ABNORMAL HIGH (ref 0.3–1.2)
Total Protein: 5.8 g/dL — ABNORMAL LOW (ref 6.5–8.1)

## 2021-01-18 LAB — GLUCOSE, CAPILLARY
Glucose-Capillary: 136 mg/dL — ABNORMAL HIGH (ref 70–99)
Glucose-Capillary: 147 mg/dL — ABNORMAL HIGH (ref 70–99)
Glucose-Capillary: 176 mg/dL — ABNORMAL HIGH (ref 70–99)
Glucose-Capillary: 198 mg/dL — ABNORMAL HIGH (ref 70–99)

## 2021-01-18 LAB — SURGICAL PCR SCREEN
MRSA, PCR: NEGATIVE
Staphylococcus aureus: NEGATIVE

## 2021-01-18 LAB — CBC
HCT: 27.3 % — ABNORMAL LOW (ref 39.0–52.0)
Hemoglobin: 8.9 g/dL — ABNORMAL LOW (ref 13.0–17.0)
MCH: 29.1 pg (ref 26.0–34.0)
MCHC: 32.6 g/dL (ref 30.0–36.0)
MCV: 89.2 fL (ref 80.0–100.0)
Platelets: 76 10*3/uL — ABNORMAL LOW (ref 150–400)
RBC: 3.06 MIL/uL — ABNORMAL LOW (ref 4.22–5.81)
RDW: 17.3 % — ABNORMAL HIGH (ref 11.5–15.5)
WBC: 7.9 10*3/uL (ref 4.0–10.5)
nRBC: 2.5 % — ABNORMAL HIGH (ref 0.0–0.2)

## 2021-01-18 NOTE — Progress Notes (Signed)
HD#7 Subjective:  Overnight Events: No events  Patient is seen at bedside.  He appears comfortable in no acute distress.  He denies chest pain or shortness of breath.  Reports mild swelling in his thighs.  No other concerns.  Objective:  Vital signs in last 24 hours: Vitals:   01/17/21 1815 01/17/21 2100 01/18/21 0553 01/18/21 0600  BP: 119/66 126/62 (!) 119/57   Pulse: 65 63 61   Resp: 16  18   Temp: 99.8 F (37.7 C) 99.6 F (37.6 C) 98.7 F (37.1 C)   TempSrc: Oral Oral Oral   SpO2: 98% 97% 97%   Weight:    99.2 kg  Height:       Supplemental O2: Room Air SpO2: 97 % O2 Flow Rate (L/min): 2 L/min   Physical Exam:  Physical Exam Constitutional:      General: He is not in acute distress.    Appearance: He is not ill-appearing or toxic-appearing.  HENT:     Head: Normocephalic.  Eyes:     General:        Right eye: No discharge.        Left eye: No discharge.     Conjunctiva/sclera: Conjunctivae normal.  Neck:     Comments: Right tunneled cath in place Cardiovascular:     Rate and Rhythm: Normal rate and regular rhythm.  Pulmonary:     Effort: Pulmonary effort is normal. No respiratory distress.     Breath sounds: Normal breath sounds.  Musculoskeletal:     Comments: Bilateral BKA.  Could not appreciate much pitting edema of bilateral thigh  Skin:    General: Skin is warm.     Coloration: Skin is not jaundiced.  Neurological:     General: No focal deficit present.     Mental Status: He is alert and oriented to person, place, and time.  Psychiatric:        Mood and Affect: Mood normal.        Behavior: Behavior normal.    Filed Weights   01/17/21 0716 01/17/21 1038 01/18/21 0600  Weight: 100.5 kg 99.1 kg 99.2 kg     Intake/Output Summary (Last 24 hours) at 01/18/2021 8786 Last data filed at 01/17/2021 1038 Gross per 24 hour  Intake 0 ml  Output 2000 ml  Net -2000 ml   Net IO Since Admission: -1,482.06 mL [01/18/21 0627]  Pertinent  Labs: CBC Latest Ref Rng & Units 01/18/2021 01/17/2021 01/16/2021  WBC 4.0 - 10.5 K/uL 7.9 8.0 8.0  Hemoglobin 13.0 - 17.0 g/dL 8.9(L) 8.9(L) 9.5(L)  Hematocrit 39.0 - 52.0 % 27.3(L) 27.0(L) 29.1(L)  Platelets 150 - 400 K/uL 76(L) 78(L) 90(L)    CMP Latest Ref Rng & Units 01/18/2021 01/17/2021 01/16/2021  Glucose 70 - 99 mg/dL 128(H) 162(H) 159(H)  BUN 8 - 23 mg/dL 41(H) 56(H) 45(H)  Creatinine 0.61 - 1.24 mg/dL 5.28(H) 6.56(H) 5.15(H)  Sodium 135 - 145 mmol/L 130(L) 128(L) 133(L)  Potassium 3.5 - 5.1 mmol/L 3.6 3.6 3.7  Chloride 98 - 111 mmol/L 96(L) 94(L) 97(L)  CO2 22 - 32 mmol/L 22 22 24   Calcium 8.9 - 10.3 mg/dL 7.9(L) 8.0(L) 8.0(L)  Total Protein 6.5 - 8.1 g/dL 5.8(L) - 5.5(L)  Total Bilirubin 0.3 - 1.2 mg/dL 3.6(H) - 3.5(H)  Alkaline Phos 38 - 126 U/L 100 - 98  AST 15 - 41 U/L 755(H) - 723(H)  ALT 0 - 44 U/L 480(H) - 699(H)    Imaging: VAS [place] UPPER  EXT VEIN MAPPING (PRE-OP AVF)  Result Date: 01/17/2021 UPPER EXTREMITY VEIN MAPPING Patient Name:  [name]  Date of Exam:   01/17/2021 Medical Rec #: [id number]       Accession #:    [id number] Date of Birth: 10/12/1950        Patient Gender: M Patient Age:   70 years Exam Location:  [facility] Procedure:      VAS [place] UPPER EXT VEIN MAPPING (PRE-OP AVF) Referring Phys: Servando Snare --------------------------------------------------------------------------------  Indications: Pre Dialysis access. History: CKD IV, CHF, HLD, HTN, DM, History of MI and stroke.  Limitations: IV lines, bandages, edema Comparison Study: No prior study on file Performing Technologist: Sharion Dove RVS  Examination Guidelines: A complete evaluation includes B-mode imaging, spectral Doppler, color Doppler, and power Doppler as needed of all accessible portions of each vessel. Bilateral testing is considered an integral part of a complete examination. Limited examinations for reoccurring indications may be performed as noted.  +-----------------+-------------+----------+----------+ Right Cephalic   Diameter (cm)Depth (cm) Findings  +-----------------+-------------+----------+----------+ Prox upper arm       0.21        0.43              +-----------------+-------------+----------+----------+ Mid upper arm        0.21        0.38              +-----------------+-------------+----------+----------+ Dist upper arm       0.22        0.44              +-----------------+-------------+----------+----------+ Antecubital fossa    0.34        0.29   IV present +-----------------+-------------+----------+----------+ Prox forearm      0.24/0.180  0.42/0.397branching  +-----------------+-------------+----------+----------+ Mid forearm       0.29/0.247  0.28/0.241branching  +-----------------+-------------+----------+----------+ Dist forearm      0.23/0.223  0.20/0.223branching  +-----------------+-------------+----------+----------+ Wrist                0.22        0.25              +-----------------+-------------+----------+----------+ +-----------------+----------------+--------------+--------------+ Right Basilic     Diameter (cm)    Depth (cm)     Findings    +-----------------+----------------+--------------+--------------+ Prox upper arm         0.38           1.93         origin     +-----------------+----------------+--------------+--------------+ Mid upper arm          0.38           1.90                    +-----------------+----------------+--------------+--------------+ Dist upper arm         0.39           1.35                    +-----------------+----------------+--------------+--------------+ Antecubital fossa0.30/0.259/0.2051.19/1.22/1.37  branching    +-----------------+----------------+--------------+--------------+ Prox forearm     0.30/0.63/0.246 1.18/1.09/1.29  branching    +-----------------+----------------+--------------+--------------+ Mid forearm             0.22           0.32                    +-----------------+----------------+--------------+--------------+ Distal forearm  not visualized +-----------------+----------------+--------------+--------------+ Wrist                                          not visualized +-----------------+----------------+--------------+--------------+ +-----------------+-------------+-----------+---------+ Left Cephalic    Diameter (cm)Depth (cm) Findings  +-----------------+-------------+-----------+---------+ Prox upper arm       0.43        0.78    branching +-----------------+-------------+-----------+---------+ Mid upper arm        0.27        0.48              +-----------------+-------------+-----------+---------+ Dist upper arm       0.30        0.56              +-----------------+-------------+-----------+---------+ Antecubital fossa    0.34        0.16              +-----------------+-------------+-----------+---------+ Prox forearm         0.23        0.30              +-----------------+-------------+-----------+---------+ Mid forearm          0.13        0.44    branching +-----------------+-------------+-----------+---------+ Dist forearm      0.12/0.151  0.19/0.288 branching +-----------------+-------------+-----------+---------+ Wrist             0.20/0.192  0.27/0.511           +-----------------+-------------+-----------+---------+ +-----------------+-------------+----------+---------+ Left Basilic     Diameter (cm)Depth (cm)Findings  +-----------------+-------------+----------+---------+ Mid upper arm        0.45        2.63    origin   +-----------------+-------------+----------+---------+ Dist upper arm       0.42        2.64             +-----------------+-------------+----------+---------+ Antecubital fossa    0.37        2.35             +-----------------+-------------+----------+---------+  Prox forearm      0.38/0.240  2.10/1.57 branching +-----------------+-------------+----------+---------+ Mid forearm          0.19        0.34             +-----------------+-------------+----------+---------+ Distal forearm       0.17        0.28             +-----------------+-------------+----------+---------+ Wrist                0.21        0.15             +-----------------+-------------+----------+---------+ *See table(s) above for measurements and observations.  Diagnosing physician:    Preliminary     Assessment/Plan:   Active Problems:   Goals of care, counseling/discussion   Acute on chronic congestive heart failure (HCC)   Ventricular tachycardia, non-sustained   ESRD needing dialysis [facility])   Patient Summary: [name] is a 70 y.o. with a pertinent PMH of hypertension, type 2 diabetes, PAD status post bilateral BKA, CAD status post CABG, CKD stage V, HFrEF, who presented with shortness of breath and bilateral thigh swelling and admitted for acute on chronic HFrEF complicated by CKD with progression to ESRD. He was started on HD.    ESRD on HD  Patient appears euvolemic on exam.  He got 2 L output from HD yesterday.  Plan for AV fistula tomorrow morning. -Appreciate nephrology's assistance with this patient -Continue holding hydralazine due to hypotension during dialysis -Monitor kidney function electrolytes daily   Acute on chronic Systolic HFrEF (EF 58-94%) Mildly Reduced RV function: Moderate TVR and Mild AS His volume status and respiratory status are stable on exam.   -Continue holding hydralazine -Continue metoprolol, and Imdur -Continue amiodarone   Congestive Hepatopathy 2/2 to volume overload LFT downtrend today.    Normocytic anemia Likely anemia of chronic kidney disease.  Hemoglobin stable 8.9 today.  Elevated ferritin is likely due to his acute illness requiring ICU admission.   Thrombocytopenia Platelets dropped > 50% on 11/9, 3-day  after starting heparin.  He belongs to the low-intermediate risk of HIT.  No evidence of bleeding or thrombosis on exam.   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  No SCDs because bilateral BKAs.  Code: Full AB: none Therapy Recs: CIR, DME: none Family Contact: pending DISPO: Anticipated discharge in 1-2 days to Rehab pending Medical stability.  Gaylan Gerold, DO 01/18/2021, 6:27 AM Pager: [phone]  Please contact the on call pager after 5 pm and on weekends at [phone].

## 2021-01-18 NOTE — PMR Pre-admission (Signed)
PMR Admission Coordinator Pre-Admission Assessment  Patient: [name] is an 70 y.o., male MRN: [id number] DOB: November 19, 1950 Height: 5' 8"  (172.7 cm) Weight: 102.5 kg  Insurance Information HMO: yes    PPO:      PCP:      IPA:      80/20:      OTHER:  PRIMARY: U[employer] Medicare      Policy#: [id number]      Subscriber: Pt CM Name:  Amedeo Gory     Phone#:      Fax#: [phone] Pre-Cert#: [id number]  approved on 01/22/21 with update due on 01/28/21    Employer: none Benefits:  Phone #: portal     Name:  Eff Date: 03/08/2020 - present Deductible: $0 (does not have deductible) OOP Max: $3,600 ($524.06 met) CIR: $295/day co-pay for days 1-5, $0/day co-pay for days 6+ SNF: $0.00 Copayment per day for days 1-20; $188.00 Copayment per day for days 21-40; $0.00 Copayment per day for days 41-100 for Medicare-covered care/maximum 100 days/benefit period Outpatient: $30/visit co-pay Home Health:  100% coverage; limited by medical necessity DME: 80% coverage; 20% co-insurance Providers: in network   SECONDARY:       Policy#:      Phone#:    The "Data Collection Information Summary" for patients in Inpatient Rehabilitation Facilities with attached "Privacy Act [place] Records" was provided and verbally reviewed with: Patient  Emergency Contact Information Contact Information     Name Relation Home Work Mobile   [place] Son   [phone]   Bonebrake,Shirley Sister [phone]     Natzke,Rufus Brother   [phone]   Ettore, Trebilcock   [phone]   Bud, Kaeser (ex wife) Other [phone]         Current Medical History  Patient Admitting Diagnosis: Heart Failure, ARF, CVA  History of Present Illness:  [name] is an 70 y.o. 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 Cardiac enzymes were elevated, thought to be due to demand ischemia. He was diagnosed with new ESRD this admission, which was felt most likely to be due to cardiorenal syndrome. He has had 4 HD sessions so far this admission. Patient had an AV graft placed on 11/14 by Vascular Surgery. 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 On 11/17, Pt. Became acutely unresponsive to voice and sternal rub after an episode of vomiting. He was noted by RN to be biting down on his tongue which seemed pale. Seizure was suspected.Duration was approximately 3 minutes. He was noted to be incontinent of stool and urine. The patient's pulse was palpable throughout the event. He was initially confused after the spell, then relatively quickly returned to his cognitive baseline.MRI was completed a revealed a small left pontine CVA. CIR was consulted to assist in return to PLOF.    Patient's medical record from [facility] has been reviewed by the rehabilitation admission coordinator and physician.  Past Medical History  Past Medical History:  Diagnosis Date   CHF (congestive heart failure) (HCC)    Chronic Kidney Disease IV    Exertional shortness of breath    "before my last OR; I'm fine now" (09/26/2012)  GERD (gastroesophageal reflux disease)    High cholesterol    Hx of seasonal allergies    Hypertension    Myocardial infarction [facility])    Scrotal edema 01/30/2019   Stroke [facility]) 2009   memory loss   Type II diabetes mellitus ([place])     Has the patient had major surgery during 100 days prior to admission? Yes  Family History   family history is not on file.  Current Medications  Current Facility-Administered Medications:    (feeding supplement) PROSource Plus liquid 30 mL, 30 mL, Oral, BID BM, Angelia Mould, MD, 30 mL at 01/23/21 1012   0.9 %   sodium chloride infusion, , Intravenous, PRN, Angelia Mould, MD, Stopped at 01/13/21 1834   0.9 %  sodium chloride infusion, , Intravenous, Continuous, [place], Charisse March, MD, Last Rate: 10 mL/hr at 01/22/21 2100, New Bag at 01/22/21 2100   acetaminophen (TYLENOL) tablet 650 mg, 650 mg, Oral, Q6H PRN, 650 mg at 01/12/21 0220 **OR** acetaminophen (TYLENOL) suppository 650 mg, 650 mg, Rectal, Q6H PRN, Angelia Mould, MD   amiodarone (PACERONE) tablet 200 mg, 200 mg, Oral, Daily, Atway, Rayann N, DO, 200 mg at 01/23/21 1344   aspirin EC tablet 81 mg, 81 mg, Oral, q morning, Angelia Mould, MD, 81 mg at 01/23/21 1011   atorvastatin (LIPITOR) tablet 40 mg, 40 mg, Oral, QHS, Atway, Rayann N, DO   Chlorhexidine Gluconate Cloth 2 % PADS 6 each, 6 each, Topical, Q0600, Angelia Mould, MD, 6 each at 01/23/21 0443   clopidogrel (PLAVIX) tablet 75 mg, 75 mg, Oral, Daily, Angelia Mould, MD, 75 mg at 01/23/21 1011   Darbepoetin Alfa (ARANESP) injection 60 mcg, 60 mcg, Intravenous, Q Thu-HD, Coladonato, Broadus John, MD, 60 mcg at 01/22/21 1105   feeding supplement (NEPRO CARB STEADY) liquid 237 mL, 237 mL, Oral, BID BM, Angelia Mould, MD, 237 mL at 01/22/21 1733   gabapentin (NEURONTIN) capsule 300 mg, 300 mg, Oral, QHS, Angelia Mould, MD, 300 mg at 01/23/21 0043   heparin sodium (porcine) injection 1,600 Units, 1.6 mL, Intravenous, Once, Velna Ochs, MD   insulin aspart (novoLOG) injection 0-15 Units, 0-15 Units, Subcutaneous, TID WC, Angelia Mould, MD, 3 Units at 01/23/21 1342   insulin aspart (novoLOG) injection 4 Units, 4 Units, Subcutaneous, TID WC, Atway, Rayann N, DO, 4 Units at 01/23/21 1343   melatonin tablet 3 mg, 3 mg, Oral, QHS PRN, Wayland Denis, MD, 3 mg at 01/21/21 2142   multivitamin (RENA-VIT) tablet 1 tablet, 1 tablet, Oral, QHS, Angelia Mould, MD, 1 tablet at 01/23/21 0043   nitroGLYCERIN (NITROSTAT) SL tablet 0.4  mg, 0.4 mg, Sublingual, Q5 min PRN, Angelia Mould, MD   ondansetron [facility]) injection 4 mg, 4 mg, Intravenous, Q8H PRN, Timothy Lasso, MD, 4 mg at 01/22/21 1915   oxyCODONE-acetaminophen (PERCOCET/ROXICET) 5-325 MG per tablet 1-2 tablet, 1-2 tablet, Oral, Q4H PRN, Angelia Mould, MD, 1 tablet at 01/21/21 1727   pantoprazole (PROTONIX) EC tablet 40 mg, 40 mg, Oral, Q0600, Angelia Mould, MD, 40 mg at 01/23/21 1012   senna-docusate (Senokot-S) tablet 1 tablet, 1 tablet, Oral, QHS PRN, Angelia Mould, MD, 1 tablet at 01/16/21 1600   sodium bicarbonate tablet 650 mg, 650 mg, Oral, BID, Angelia Mould, MD, 650 mg at 01/23/21 1011  Patients Current Diet:  Diet Order             DIET DYS 2 Room service appropriate? Yes with  Assist; Fluid consistency: Thin  Diet effective now           Diet - low sodium heart healthy                   Precautions / Restrictions Precautions Precautions: Fall, Other (comment) Precaution Comments: bilateral BKA Other Brace: Bilateral prostheses Restrictions Weight Bearing Restrictions: No   Has the patient had 2 or more falls or a fall with injury in the past year? No  Prior Activity Level Community (5-7x/wk): Pt went out nearly daily  Prior Functional Level Self Care: Did the patient need help bathing, dressing, using the toilet or eating? Independent  Indoor Mobility: Did the patient need assistance with walking from room to room (with or without device)? Independent  Stairs: Did the patient need assistance with internal or external stairs (with or without device)? Independent  Functional Cognition: Did the patient need help planning regular tasks such as shopping or remembering to take medications? Independent  Patient Information Are you of Hispanic, Latino/a,or Spanish origin?: A. No, not of Hispanic, Latino/a, or Spanish origin What is your race?: B. Black or African American Do you need or want  an interpreter to communicate with a doctor or health care staff?: 0. No  Patient's Response To:  Health Literacy and Transportation Is the patient able to respond to health literacy and transportation needs?: Yes Health Literacy - How often do you need to have someone help you when you read instructions, pamphlets, or other written material from your doctor or pharmacy?: Never In the past 12 months, has lack of transportation kept you from medical appointments or from getting medications?: No In the past 12 months, has lack of transportation kept you from meetings, work, or from getting things needed for daily living?: No  Development worker, international aid / [place] Devices/Equipment: Prosthesis, Shower chair with back, Radio producer (specify quad or straight), Environmental consultant (specify type), Wheelchair Home Equipment: Conservation officer, nature (2 wheels), [employer] - single point, Wheelchair - manual  Prior Device Use: Indicate devices/aids used by the patient prior to current illness, exacerbation or injury? Manual wheelchair and Motorized wheelchair or scooter  Current Functional Level Cognition  Overall Cognitive Status: Impaired/Different from baseline Orientation Level: Oriented X4 Safety/Judgement: Decreased awareness of safety, Decreased awareness of deficits General Comments: patient oriented x4 and able to follow directions    Extremity Assessment (includes Sensation/Coordination)  Upper Extremity Assessment: RUE deficits/detail, LUE deficits/detail RUE Coordination: decreased fine motor LUE Coordination: decreased fine motor  Lower Extremity Assessment: Defer to PT evaluation RLE Deficits / Details: BKA LLE Deficits / Details: BKA    ADLs  Overall ADL's : Needs assistance/impaired Eating/Feeding: Minimal assistance, Sitting Eating/Feeding Details (indicate cue type and reason): assist to open containers Grooming: Wash/dry hands, Sitting, Set up Grooming Details (indicate cue type and reason):  performed in recliner Upper Body Bathing: Set up, Sitting Lower Body Bathing: Sitting/lateral leans, Maximal assistance Upper Body Dressing : Set up, Sitting Lower Body Dressing: Sitting/lateral leans, Maximal assistance Toilet Transfer: +2 for physical assistance, Minimal assistance (lateral transfer) Toileting- Clothing Manipulation and Hygiene: Moderate assistance, Sitting/lateral lean General ADL Comments: only agreeable to grooming this am    Mobility  Overal bed mobility: Needs Assistance Bed Mobility: Supine to Sit Rolling: Min assist Supine to sit: Mod assist, HOB elevated Sit to supine: Min guard, HOB elevated General bed mobility comments: pt requiring assistance to pivot hips with use of bed pad    Transfers  Overall transfer level: Needs assistance  Equipment used:  (bed pad) Transfers: Bed to chair/wheelchair/BSC Bed to/from chair/wheelchair/BSC transfer type:: Lateral/scoot transfer Anterior-Posterior transfers: Max assist, +2 physical assistance  Lateral/Scoot Transfers: Max assist, From elevated surface General transfer comment: pt requiring maxA to laterally scoot from bed to drop arm recliner. bed elevated to allow for downhill scoot. pt requiring cues to maintain head forward, no clearance of buttocks noted with scoot attempts.    Ambulation / Gait / Stairs / Wheelchair Mobility  Ambulation/Gait General Gait Details: Unable due to LE's too swollen for prosthetics to fit    Posture / Balance Dynamic Sitting Balance Sitting balance - Comments: posterior lean Balance Overall balance assessment: Needs assistance Sitting-balance support: Single extremity supported, Bilateral upper extremity supported, Feet supported Sitting balance-Leahy Scale: Poor Sitting balance - Comments: posterior lean Postural control: Posterior lean Standing balance support: Bilateral upper extremity supported Standing balance-Leahy Scale: Poor Standing balance comment: stood from  relcliner with max assist to power up and mod assist for balance    Special needs/care consideration Skin surgical incision, Diabetic management Novolog 0-15 units sQ 3x daility with meals, and Special service needs BLE prostheses , HD TTS   Previous Home Environment (from acute therapy documentation) Living Arrangements: Other (Comment) (ex wife) Available Help at Discharge: Family Type of Home: House Home Layout: One level Home Access: Stairs to enter Entrance Stairs-Rails: Right Entrance Stairs-Number of Steps: 4 Bathroom Shower/Tub: Other (comment) (sponges bathes at sink) Biochemist, clinical: Standard Home Care Services: No Additional Comments: reports his w/c arm is broken, gave [facility]  Discharge Living Setting Plans for Discharge Living Setting: Alone Type of Home at Discharge: House Discharge Home Layout: One level Discharge Home Access: Stairs to enter Entrance Stairs-Rails: Right Entrance Stairs-Number of Steps: 4 Discharge Bathroom Shower/Tub: Tub/shower unit Discharge Bathroom Toilet: Standard Discharge Bathroom Accessibility: Yes How Accessible: Accessible via walker Does the patient have any problems obtaining your medications?: No  Social/Family/Support Systems    Goals Patient/Family Goal for Rehab: PT/OT Supervision Expected length of stay: 12-14 days Pt/Family Agrees to Admission and willing to participate: Yes Program Orientation Provided & Reviewed with Pt/Caregiver Including Roles  & Responsibilities: Yes  Decrease burden of Care through IP rehab admission: Specialzed equipment needs, Decrease number of caregivers, Bowel and bladder program, and Patient/family education  Possible need for SNF placement upon discharge: not anticipated   Patient Condition: I have reviewed medical records from [facility], spoken with CM, and patient. I met with patient at the bedside for inpatient rehabilitation assessment.  Patient will benefit from  ongoing PT and OT, can actively participate in 3 hours of therapy a day 5 days of the week, and can make measurable gains during the admission.  Patient will also benefit from the coordinated team approach during an Inpatient Acute Rehabilitation admission.  The patient will receive intensive therapy as well as Rehabilitation physician, nursing, social worker, and care management interventions.  Due to safety, skin/wound care, disease management, medication administration, pain management, and patient education the patient requires 24 hour a day rehabilitation nursing.  The patient is currently minA  with mobility and basic ADLs.  Discharge setting and therapy post discharge at home with home health is anticipated.  Patient has agreed to participate in the Acute Inpatient Rehabilitation Program and will admit Sunday 01/25/21.  Preadmission Screen Completed By:  Genella Mech, 01/23/2021 2:18 PM ______________________________________________________________________   Discussed status with Dr. Naaman Plummer on 01/23/21 at 1330 and received approval for admission today.  Admission Coordinator:  Genella Mech, CCC-SLP, time 1330/Date 01/23/21   Assessment/Plan: Diagnosis: Debility Does the need for close, 24 hr/day Medical supervision in concert with the patient's rehab needs make it unreasonable for this patient to be served in a less intensive setting? 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 Yes Does the patient require coordinated care of a physician, rehab nurse, PT, OT to address physical  and functional deficits in the context of the above medical diagnosis(es)? Yes Addressing deficits in the following areas: balance, endurance, locomotion, strength, transferring, bathing, dressing, toileting, and psychosocial support Can the patient actively participate in an intensive therapy program of at least 3 hrs of therapy 5 days a week? Yes The potential for patient to make measurable gains while on inpatient rehab is excellent Anticipated functional outcomes upon discharge from inpatient rehab: min assist PT, min assist OT, n/a SLP Estimated rehab length of stay to reach the above functional goals is: 10-14 days. Anticipated discharge destination: Home 10. Overall Rehab/Functional Prognosis: good   MD Signature: Delice Lesch, MD, ABPMR

## 2021-01-18 NOTE — Progress Notes (Signed)
  Progress Note    01/18/2021 9:00 AM * No surgery date entered *  Subjective: No overnight complaints  Vitals:   01/17/21 2100 01/18/21 0553  BP: 126/62 (!) 119/57  Pulse: 63 61  Resp:  18  Temp: 99.6 F (37.6 C) 98.7 F (37.1 C)  SpO2: 97% 97%    Physical Exam: Awake alert and oriented Nonlabored respirations Left upper extremity does have an IV in the forearm Palpable left radial and ulnar pulses  CBC    Component Value Date/Time   WBC 7.9 01/18/2021 0432   RBC 3.06 (L) 01/18/2021 0432   HGB 8.9 (L) 01/18/2021 0432   HGB 9.4 (L) 02/06/2019 1016   HCT 27.3 (L) 01/18/2021 0432   HCT 28.4 (L) 02/06/2019 1016   PLT 76 (L) 01/18/2021 0432   PLT 199 02/06/2019 1016   MCV 89.2 01/18/2021 0432   MCV 86 02/06/2019 1016   MCH 29.1 01/18/2021 0432   MCHC 32.6 01/18/2021 0432   RDW 17.3 (H) 01/18/2021 0432   RDW 18.5 (H) 02/06/2019 1016   LYMPHSABS 1.5 01/11/2021 0851   LYMPHSABS 1.3 01/29/2019 1033   MONOABS 0.7 01/11/2021 0851   EOSABS 0.2 01/11/2021 0851   EOSABS 0.1 01/29/2019 1033   BASOSABS 0.0 01/11/2021 0851   BASOSABS 0.0 01/29/2019 1033    BMET    Component Value Date/Time   NA 130 (L) 01/18/2021 0432   NA 135 07/31/2019 1433   K 3.6 01/18/2021 0432   CL 96 (L) 01/18/2021 0432   CO2 22 01/18/2021 0432   GLUCOSE 128 (H) 01/18/2021 0432   BUN 41 (H) 01/18/2021 0432   BUN 35 (H) 07/31/2019 1433   CREATININE 5.28 (H) 01/18/2021 0432   CALCIUM 7.9 (L) 01/18/2021 0432   GFRNONAA 11 (L) 01/18/2021 0432   GFRAA 19 (L) 10/12/2019 1548    INR    Component Value Date/Time   INR 1.32 09/06/2012 0951     Intake/Output Summary (Last 24 hours) at 01/18/2021 0900 Last data filed at 01/17/2021 1038 Gross per 24 hour  Intake --  Output 2000 ml  Net -2000 ml     Assessment/plan:  70 y.o. male is here with likely diagnosis of end-stage renal disease currently requiring dialysis via tunneled dialysis catheter.  We have been asked to place permanent  access.  Patient is left-hand dominant but does appear to have better veins on the left upper extremity.  I have discussed his options and he is agreeable to left arm AV fistula versus graft.  We will plan for this tomorrow in the OR and I discussed with him that this will be performed with one of my partners likely Dr. Scot Dock.  I discussed the risk and benefits of the procedure and he demonstrates good understanding.  He will be n.p.o. past midnight.   Maretta Overdorf C. Donzetta Matters, MD [facility] [place] Office: [phone] Pager: [phone]  01/18/2021 9:00 AM

## 2021-01-18 NOTE — Plan of Care (Signed)
  Problem: Education: Goal: Knowledge of General Education information will improve Description Including pain rating scale, medication(s)/side effects and non-pharmacologic comfort measures Outcome: Progressing   Problem: Health Behavior/Discharge Planning: Goal: Ability to manage health-related needs will improve Outcome: Progressing   Problem: Clinical Measurements: Goal: Ability to maintain clinical measurements within normal limits will improve Outcome: Progressing Goal: Will remain free from infection Outcome: Progressing Goal: Diagnostic test results will improve Outcome: Progressing Goal: Respiratory complications will improve Outcome: Progressing Goal: Cardiovascular complication will be avoided Outcome: Progressing   Problem: Activity: Goal: Risk for activity intolerance will decrease Outcome: Progressing   Problem: Nutrition: Goal: Adequate nutrition will be maintained Outcome: Progressing   Problem: Coping: Goal: Level of anxiety will decrease Outcome: Progressing   Problem: Elimination: Goal: Will not experience complications related to bowel motility Outcome: Progressing Goal: Will not experience complications related to urinary retention Outcome: Progressing   Problem: Pain Managment: Goal: General experience of comfort will improve Outcome: Progressing   Problem: Skin Integrity: Goal: Risk for impaired skin integrity will decrease Outcome: Progressing   Problem: Education: Goal: Ability to demonstrate management of disease process will improve Outcome: Progressing Goal: Ability to verbalize understanding of medication therapies will improve Outcome: Progressing Goal: Individualized Educational Video(s) Outcome: Progressing   Problem: Activity: Goal: Capacity to carry out activities will improve Outcome: Progressing   Problem: Cardiac: Goal: Ability to achieve and maintain adequate cardiopulmonary perfusion will improve Outcome:  Progressing   

## 2021-01-18 NOTE — H&P (View-Only) (Signed)
  Progress Note    01/18/2021 9:00 AM * No surgery date entered *  Subjective: No overnight complaints  Vitals:   01/17/21 2100 01/18/21 0553  BP: 126/62 (!) 119/57  Pulse: 63 61  Resp:  18  Temp: 99.6 F (37.6 C) 98.7 F (37.1 C)  SpO2: 97% 97%    Physical Exam: Awake alert and oriented Nonlabored respirations Left upper extremity does have an IV in the forearm Palpable left radial and ulnar pulses  CBC    Component Value Date/Time   WBC 7.9 01/18/2021 0432   RBC 3.06 (L) 01/18/2021 0432   HGB 8.9 (L) 01/18/2021 0432   HGB 9.4 (L) 02/06/2019 1016   HCT 27.3 (L) 01/18/2021 0432   HCT 28.4 (L) 02/06/2019 1016   PLT 76 (L) 01/18/2021 0432   PLT 199 02/06/2019 1016   MCV 89.2 01/18/2021 0432   MCV 86 02/06/2019 1016   MCH 29.1 01/18/2021 0432   MCHC 32.6 01/18/2021 0432   RDW 17.3 (H) 01/18/2021 0432   RDW 18.5 (H) 02/06/2019 1016   LYMPHSABS 1.5 01/11/2021 0851   LYMPHSABS 1.3 01/29/2019 1033   MONOABS 0.7 01/11/2021 0851   EOSABS 0.2 01/11/2021 0851   EOSABS 0.1 01/29/2019 1033   BASOSABS 0.0 01/11/2021 0851   BASOSABS 0.0 01/29/2019 1033    BMET    Component Value Date/Time   NA 130 (L) 01/18/2021 0432   NA 135 07/31/2019 1433   K 3.6 01/18/2021 0432   CL 96 (L) 01/18/2021 0432   CO2 22 01/18/2021 0432   GLUCOSE 128 (H) 01/18/2021 0432   BUN 41 (H) 01/18/2021 0432   BUN 35 (H) 07/31/2019 1433   CREATININE 5.28 (H) 01/18/2021 0432   CALCIUM 7.9 (L) 01/18/2021 0432   GFRNONAA 11 (L) 01/18/2021 0432   GFRAA 19 (L) 10/12/2019 1548    INR    Component Value Date/Time   INR 1.32 09/06/2012 0951     Intake/Output Summary (Last 24 hours) at 01/18/2021 0900 Last data filed at 01/17/2021 1038 Gross per 24 hour  Intake --  Output 2000 ml  Net -2000 ml     Assessment/plan:  70 y.o. male is here with likely diagnosis of end-stage renal disease currently requiring dialysis via tunneled dialysis catheter.  We have been asked to place permanent  access.  Patient is left-hand dominant but does appear to have better veins on the left upper extremity.  I have discussed his options and he is agreeable to left arm AV fistula versus graft.  We will plan for this tomorrow in the OR and I discussed with him that this will be performed with one of my partners likely Dr. Scot Dock.  I discussed the risk and benefits of the procedure and he demonstrates good understanding.  He will be n.p.o. past midnight.   Zaccary Creech C. Donzetta Matters, MD [facility] [place] Office: [phone] Pager: [phone]  01/18/2021 9:00 AM

## 2021-01-18 NOTE — Progress Notes (Addendum)
[facility] Progress Note  Name: [name] MRN: [id number] DOB: 08-May-1950  Chief Complaint:  Shortness of breath  Subjective:  Last HD on 11/12 with 2 kg UF.  There is no urine output over 11/12 charted.  He was seen by vascular yesterday for evaluation for AVF.  He had vein mapping yesterday as well.  He's got a paper on his bed which lists a TTS HD schedule at [place] [facility] - he has been accepted.  Confirmed with HD SW note.  He hopes that he is able to get MWF schedule - he would prefer this and I asked him to speak with HD SW re: same.   Review of systems:   Denies shortness of breath No chest pain  No n/v  Intake/Output Summary (Last 24 hours) at 01/18/2021 0746 Last data filed at 01/17/2021 1038 Gross per 24 hour  Intake --  Output 2000 ml  Net -2000 ml    Vitals:  Vitals:   01/17/21 1815 01/17/21 2100 01/18/21 0553 01/18/21 0600  BP: 119/66 126/62 (!) 119/57   Pulse: 65 63 61   Resp: 16  18   Temp: 99.8 F (37.7 C) 99.6 F (37.6 C) 98.7 F (37.1 C)   TempSrc: Oral Oral Oral   SpO2: 98% 97% 97%   Weight:    99.2 kg  Height:         Physical Exam:   General adult male in bed in no acute distress at rest HEENT normocephalic atraumatic extraocular movements intact sclera anicteric Neck supple trachea midline Lungs reduced on auscultation bilaterally normal work of breathing at rest  Heart S1S2 no rub Abdomen soft nontender nondistended Extremities sacral and thigh edema bilaterally; bilateral BKA Psych normal mood and affect Neuro awake and conversant; follows command and provides hx Access RIJ tunn catheter in place; dressing clean   Medications reviewed   Labs:  BMP Latest Ref Rng & Units 01/18/2021 01/17/2021 01/16/2021  Glucose 70 - 99 mg/dL 128(H) 162(H) 159(H)  BUN 8 - 23 mg/dL 41(H) 56(H) 45(H)  Creatinine 0.61 - 1.24 mg/dL 5.28(H) 6.56(H) 5.15(H)  BUN/Creat Ratio 10 - 24 - - -  Sodium 135 - 145 mmol/L 130(L) 128(L) 133(L)  Potassium  3.5 - 5.1 mmol/L 3.6 3.6 3.7  Chloride 98 - 111 mmol/L 96(L) 94(L) 97(L)  CO2 22 - 32 mmol/L 22 22 24   Calcium 8.9 - 10.3 mg/dL 7.9(L) 8.0(L) 8.0(L)     Assessment/Plan:   # 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  He states that he has changed his mind about dialysis, now having symptoms and wants to be here for grandchildren; minimal response to diuretics. S/p tunneled catheter with IR on 11/9 followed by first HD treatment.  Baseline reported as 2.7-3.2 per last note - No acute indication for HD today.  Plan for next tx on 11/15 per a TTS schedule.  He has been accepted to [facility] on TTS schedule (note he would prefer MWF and I let him know to speak with HD SW to see if possible)   - I have requested CLIP as AKI (will need to change to ESRD).  I do not feel that he will recover function sufficient to come off of HD - appreciate vascular surgery.  We have consulted for permanent access  - reordered strict ins/outs  # Hypokalemia - now resolved and managed with HD - normally on lokelma 10 gram BID at home (  recently increased to BID dosing) - now on hold   # HFrEF   - diuretics/volume management as above - now transitioned to HD to optimize volume status   # NSVT - on amio; per primary    # Acute hypoxic resp failure  - optimize volume status - HD as above. Now off of supplemental oxygen  # HTN  - acceptable control    # Normocytic anemia  - secondary to ckd. Markedly elevated ferritin - iron is ok.  - no PRBC indicated - on retacrit 5,000 units monthly as outpatient - last rec'd 12/23/20 - redosed ESA - aranesp 40 mcg once on 32/02  # Metabolic acidosis - Setting of worsening renal failure but also note elevated lactic acid - managed now with HD  # Transaminitis  - setting of critical illness, overload - improved - Per primary team   # Thrombocytopenia - per primary team. Note they ordered hit antibody which is  pending - No heparin with HD  - noted he was previously on dvt ppx with heparin - this is now off - platelets low but stable today. Setting of transaminitis as well   Claudia Desanctis, MD 01/18/2021 8:09 AM

## 2021-01-19 ENCOUNTER — Encounter (HOSPITAL_COMMUNITY)
Admission: EM | Disposition: A | Discharge status: Rehabilitation Facility | Payer: Self-pay | Source: Home / Self Care | Attending: Internal Medicine

## 2021-01-19 ENCOUNTER — Inpatient Hospital Stay (HOSPITAL_COMMUNITY): Payer: Medicare Other | Admitting: Certified Registered Nurse Anesthetist

## 2021-01-19 ENCOUNTER — Encounter (HOSPITAL_COMMUNITY): Payer: Self-pay | Admitting: Internal Medicine

## 2021-01-19 DIAGNOSIS — I5023 Acute on chronic systolic (congestive) heart failure: Secondary | ICD-10-CM | POA: Diagnosis not present

## 2021-01-19 DIAGNOSIS — N400 Enlarged prostate without lower urinary tract symptoms: Secondary | ICD-10-CM

## 2021-01-19 DIAGNOSIS — Z992 Dependence on renal dialysis: Secondary | ICD-10-CM

## 2021-01-19 DIAGNOSIS — N186 End stage renal disease: Secondary | ICD-10-CM

## 2021-01-19 DIAGNOSIS — D631 Anemia in chronic kidney disease: Secondary | ICD-10-CM

## 2021-01-19 HISTORY — PX: AV FISTULA PLACEMENT: SHX1204

## 2021-01-19 LAB — BASIC METABOLIC PANEL
Anion gap: 14 (ref 5–15)
BUN: 61 mg/dL — ABNORMAL HIGH (ref 8–23)
CO2: 20 mmol/L — ABNORMAL LOW (ref 22–32)
Calcium: 8.2 mg/dL — ABNORMAL LOW (ref 8.9–10.3)
Chloride: 97 mmol/L — ABNORMAL LOW (ref 98–111)
Creatinine, Ser: 6.7 mg/dL — ABNORMAL HIGH (ref 0.61–1.24)
GFR, Estimated: 8 mL/min — ABNORMAL LOW (ref >60)
Glucose, Bld: 192 mg/dL — ABNORMAL HIGH (ref 70–99)
Potassium: 3.7 mmol/L (ref 3.5–5.1)
Sodium: 131 mmol/L — ABNORMAL LOW (ref 135–145)

## 2021-01-19 LAB — HEPARIN INDUCED PLATELET AB (HIT ANTIBODY): Heparin Induced Plt Ab: 0.119 OD (ref 0.000–0.400)

## 2021-01-19 LAB — GLUCOSE, CAPILLARY
Glucose-Capillary: 176 mg/dL — ABNORMAL HIGH (ref 70–99)
Glucose-Capillary: 155 mg/dL — ABNORMAL HIGH (ref 70–99)
Glucose-Capillary: 256 mg/dL — ABNORMAL HIGH (ref 70–99)
Glucose-Capillary: 237 mg/dL — ABNORMAL HIGH (ref 70–99)

## 2021-01-19 SURGERY — ARTERIOVENOUS (AV) FISTULA CREATION
Anesthesia: General | Laterality: Left

## 2021-01-19 MED ORDER — HEMOSTATIC AGENTS (NO CHARGE) OPTIME
TOPICAL | Status: DC | PRN
  Administered 2021-01-19 (×2): 1 via TOPICAL

## 2021-01-19 MED ORDER — OXYCODONE-ACETAMINOPHEN 5-325 MG PO TABS
1.0000 | ORAL_TABLET | ORAL | Status: DC | PRN
  Administered 2021-01-21 (×2): 1 via ORAL
  Administered 2021-01-24: 2 via ORAL
  Filled 2021-01-19: qty 1
  Filled 2021-01-19: qty 2
  Filled 2021-01-19: qty 1

## 2021-01-19 MED ORDER — CHLORHEXIDINE GLUCONATE 0.12 % MT SOLN: 15.0000 mL | Freq: Once | OROMUCOSAL | Status: AC

## 2021-01-19 MED ORDER — HEPARIN 6000 UNIT IRRIGATION SOLUTION
Status: AC
  Filled 2021-01-19: qty 500

## 2021-01-19 MED ORDER — FENTANYL CITRATE (PF) 100 MCG/2ML IJ SOLN: 25.0000 ug | INTRAMUSCULAR | Status: DC | PRN

## 2021-01-19 MED ORDER — EPHEDRINE 5 MG/ML INJ
INTRAVENOUS | Status: AC
  Filled 2021-01-19: qty 5

## 2021-01-19 MED ORDER — MIDAZOLAM HCL 2 MG/2ML IJ SOLN
INTRAMUSCULAR | Status: AC
  Filled 2021-01-19: qty 2

## 2021-01-19 MED ORDER — PHENYLEPHRINE 40 MCG/ML (10ML) SYRINGE FOR IV PUSH (FOR BLOOD PRESSURE SUPPORT)
PREFILLED_SYRINGE | INTRAVENOUS | Status: AC
  Filled 2021-01-19: qty 10

## 2021-01-19 MED ORDER — PROTAMINE SULFATE 10 MG/ML IV SOLN
INTRAVENOUS | Status: DC | PRN
  Administered 2021-01-19: 10 mg via INTRAVENOUS
  Administered 2021-01-19: 20 mg via INTRAVENOUS
  Administered 2021-01-19: 30 mg via INTRAVENOUS

## 2021-01-19 MED ORDER — SODIUM ZIRCONIUM CYCLOSILICATE 10 G PO PACK: 10.0000 g | PACK | Freq: Two times a day (BID) | ORAL | Status: DC

## 2021-01-19 MED ORDER — LIDOCAINE HCL (PF) 1 % IJ SOLN
INTRAMUSCULAR | Status: AC
  Filled 2021-01-19: qty 30

## 2021-01-19 MED ORDER — PROPOFOL 10 MG/ML IV BOLUS
INTRAVENOUS | Status: AC
  Filled 2021-01-19: qty 20

## 2021-01-19 MED ORDER — SODIUM CHLORIDE 0.9 % IV SOLN: INTRAVENOUS | Status: DC

## 2021-01-19 MED ORDER — CHLORHEXIDINE GLUCONATE 0.12 % MT SOLN
OROMUCOSAL | Status: AC
  Administered 2021-01-19: 15 mL via OROMUCOSAL
  Filled 2021-01-19: qty 15

## 2021-01-19 MED ORDER — THROMBIN (RECOMBINANT) 20000 UNITS EX SOLR
CUTANEOUS | Status: AC
  Filled 2021-01-19: qty 20000

## 2021-01-19 MED ORDER — FENTANYL CITRATE (PF) 250 MCG/5ML IJ SOLN
INTRAMUSCULAR | Status: AC
  Filled 2021-01-19: qty 5

## 2021-01-19 MED ORDER — CEFAZOLIN SODIUM-DEXTROSE 2-4 GM/100ML-% IV SOLN
2.0000 g | Freq: Once | INTRAVENOUS | Status: DC
  Filled 2021-01-19: qty 100

## 2021-01-19 MED ORDER — DEXAMETHASONE SODIUM PHOSPHATE 10 MG/ML IJ SOLN
INTRAMUSCULAR | Status: AC
  Filled 2021-01-19: qty 1

## 2021-01-19 MED ORDER — EPHEDRINE SULFATE 50 MG/ML IJ SOLN
INTRAMUSCULAR | Status: DC | PRN
  Administered 2021-01-19 (×2): 5 mg via INTRAVENOUS
  Administered 2021-01-19: 10 mg via INTRAVENOUS

## 2021-01-19 MED ORDER — PROTAMINE SULFATE 10 MG/ML IV SOLN
INTRAVENOUS | Status: AC
  Filled 2021-01-19: qty 5

## 2021-01-19 MED ORDER — PHENYLEPHRINE HCL (PRESSORS) 10 MG/ML IV SOLN
INTRAVENOUS | Status: DC | PRN
  Administered 2021-01-19 (×3): 80 ug via INTRAVENOUS

## 2021-01-19 MED ORDER — OXYCODONE HCL 5 MG PO TABS: 5.0000 mg | ORAL_TABLET | Freq: Once | ORAL | Status: DC | PRN

## 2021-01-19 MED ORDER — DEXAMETHASONE SODIUM PHOSPHATE 10 MG/ML IJ SOLN
INTRAMUSCULAR | Status: DC | PRN
Start: 2021-01-19 — End: 2021-01-19
  Administered 2021-01-19: 5 mg via INTRAVENOUS

## 2021-01-19 MED ORDER — ONDANSETRON HCL 4 MG/2ML IJ SOLN: 4.0000 mg | Freq: Four times a day (QID) | INTRAMUSCULAR | Status: DC | PRN

## 2021-01-19 MED ORDER — ORAL CARE MOUTH RINSE: 15.0000 mL | Freq: Once | OROMUCOSAL | Status: AC

## 2021-01-19 MED ORDER — FENTANYL CITRATE (PF) 250 MCG/5ML IJ SOLN
INTRAMUSCULAR | Status: DC | PRN
  Administered 2021-01-19: 50 ug via INTRAVENOUS

## 2021-01-19 MED ORDER — ONDANSETRON HCL 4 MG/2ML IJ SOLN
INTRAMUSCULAR | Status: DC | PRN
  Administered 2021-01-19: 4 mg via INTRAVENOUS

## 2021-01-19 MED ORDER — 0.9 % SODIUM CHLORIDE (POUR BTL) OPTIME
TOPICAL | Status: DC | PRN
  Administered 2021-01-19: 1000 mL

## 2021-01-19 MED ORDER — HEPARIN SODIUM (PORCINE) 1000 UNIT/ML IJ SOLN
INTRAMUSCULAR | Status: AC
  Filled 2021-01-19: qty 2

## 2021-01-19 MED ORDER — PROPOFOL 10 MG/ML IV BOLUS
INTRAVENOUS | Status: DC | PRN
  Administered 2021-01-19: 200 mg via INTRAVENOUS

## 2021-01-19 MED ORDER — LIDOCAINE-EPINEPHRINE (PF) 1 %-1:200000 IJ SOLN
INTRAMUSCULAR | Status: DC | PRN
  Administered 2021-01-19: 25 mL

## 2021-01-19 MED ORDER — OXYCODONE HCL 5 MG/5ML PO SOLN: 5.0000 mg | Freq: Once | ORAL | Status: DC | PRN

## 2021-01-19 MED ORDER — SODIUM BICARBONATE 650 MG PO TABS
650.0000 mg | ORAL_TABLET | Freq: Two times a day (BID) | ORAL | Status: DC
  Administered 2021-01-19 – 2021-01-25 (×12): 650 mg via ORAL
  Filled 2021-01-19 (×12): qty 1

## 2021-01-19 MED ORDER — PHENYLEPHRINE HCL-NACL 20-0.9 MG/250ML-% IV SOLN
INTRAVENOUS | Status: DC | PRN
  Administered 2021-01-19: 40 ug/min via INTRAVENOUS

## 2021-01-19 MED ORDER — LIDOCAINE-EPINEPHRINE (PF) 1 %-1:200000 IJ SOLN
INTRAMUSCULAR | Status: AC
  Filled 2021-01-19: qty 30

## 2021-01-19 MED ORDER — HEPARIN 6000 UNIT IRRIGATION SOLUTION
Status: DC | PRN
  Administered 2021-01-19: 1

## 2021-01-19 MED ORDER — ARTIFICIAL TEARS OPHTHALMIC OINT
TOPICAL_OINTMENT | OPHTHALMIC | Status: AC
  Filled 2021-01-19: qty 3.5

## 2021-01-19 MED ORDER — SODIUM CHLORIDE (PF) 0.9 % IJ SOLN
INTRAMUSCULAR | Status: DC | PRN
  Administered 2021-01-19: 10 mL

## 2021-01-19 MED ORDER — HEPARIN SODIUM (PORCINE) 1000 UNIT/ML IJ SOLN
INTRAMUSCULAR | Status: DC | PRN
  Administered 2021-01-19: 9000 [IU] via INTRAVENOUS

## 2021-01-19 MED ORDER — SODIUM CHLORIDE 0.9 % IV SOLN: INTRAVENOUS | Status: DC | PRN

## 2021-01-19 MED ORDER — ONDANSETRON HCL 4 MG/2ML IJ SOLN
INTRAMUSCULAR | Status: AC
  Filled 2021-01-19: qty 2

## 2021-01-19 MED ORDER — LIDOCAINE 2% (20 MG/ML) 5 ML SYRINGE
INTRAMUSCULAR | Status: AC
  Filled 2021-01-19: qty 5

## 2021-01-19 MED ORDER — PAPAVERINE HCL 30 MG/ML IJ SOLN
INTRAMUSCULAR | Status: AC
  Filled 2021-01-19: qty 2

## 2021-01-19 MED ORDER — LIDOCAINE 2% (20 MG/ML) 5 ML SYRINGE
INTRAMUSCULAR | Status: DC | PRN
  Administered 2021-01-19: 60 mg via INTRAVENOUS

## 2021-01-19 MED ORDER — NITROGLYCERIN 0.4 MG SL SUBL: 0.4000 mg | SUBLINGUAL_TABLET | SUBLINGUAL | Status: DC | PRN

## 2021-01-19 SURGICAL SUPPLY — 38 items
ADH SKN CLS APL DERMABOND .7 (GAUZE/BANDAGES/DRESSINGS) ×1
ARMBAND PINK RESTRICT EXTREMIT (MISCELLANEOUS) ×3 IMPLANT
BAG COUNTER SPONGE SURGICOUNT (BAG) ×2 IMPLANT
BAG SPNG CNTER NS LX DISP (BAG) ×1
CANISTER SUCT 3000ML PPV (MISCELLANEOUS) ×2 IMPLANT
CANNULA VESSEL 3MM 2 BLNT TIP (CANNULA) ×2 IMPLANT
CLIP VESOCCLUDE MED 6/CT (CLIP) ×2 IMPLANT
CLIP VESOCCLUDE SM WIDE 6/CT (CLIP) ×2 IMPLANT
COVER PROBE W GEL 5X96 (DRAPES) IMPLANT
DECANTER SPIKE VIAL GLASS SM (MISCELLANEOUS) ×2 IMPLANT
DERMABOND ADVANCED (GAUZE/BANDAGES/DRESSINGS) ×1
DERMABOND ADVANCED .7 DNX12 (GAUZE/BANDAGES/DRESSINGS) ×1 IMPLANT
ELECT REM PT RETURN 9FT ADLT (ELECTROSURGICAL) ×2
ELECTRODE REM PT RTRN 9FT ADLT (ELECTROSURGICAL) ×1 IMPLANT
GLOVE SRG 8 PF TXTR STRL LF DI (GLOVE) ×1 IMPLANT
GLOVE SURG ENC MOIS LTX SZ7.5 (GLOVE) ×2 IMPLANT
GLOVE SURG POLY ORTHO LF SZ7.5 (GLOVE) IMPLANT
GLOVE SURG UNDER LTX SZ8 (GLOVE) ×2 IMPLANT
GLOVE SURG UNDER POLY LF SZ8 (GLOVE) ×2
GOWN STRL REUS W/ TWL LRG LVL3 (GOWN DISPOSABLE) ×3 IMPLANT
GOWN STRL REUS W/TWL LRG LVL3 (GOWN DISPOSABLE) ×6
GRAFT GORETEX STRT 4-7X45 (Vascular Products) ×1 IMPLANT
HEMOSTAT SNOW SURGICEL 2X4 (HEMOSTASIS) ×1 IMPLANT
HEMOSTAT SURGICEL 2X14 (HEMOSTASIS) ×1 IMPLANT
KIT BASIN OR (CUSTOM PROCEDURE TRAY) ×2 IMPLANT
KIT TURNOVER KIT B (KITS) ×2 IMPLANT
NS IRRIG 1000ML POUR BTL (IV SOLUTION) ×2 IMPLANT
PACK CV ACCESS (CUSTOM PROCEDURE TRAY) ×2 IMPLANT
PAD ARMBOARD 7.5X6 YLW CONV (MISCELLANEOUS) ×3 IMPLANT
SPONGE SURGIFOAM ABS GEL 100 (HEMOSTASIS) IMPLANT
SPONGE T-LAP 18X18 ~~LOC~~+RFID (SPONGE) ×1 IMPLANT
SUT MNCRL AB 4-0 PS2 18 (SUTURE) ×3 IMPLANT
SUT PROLENE 6 0 BV (SUTURE) ×6 IMPLANT
SUT VIC AB 3-0 SH 27 (SUTURE) ×4
SUT VIC AB 3-0 SH 27X BRD (SUTURE) ×1 IMPLANT
TOWEL GREEN STERILE (TOWEL DISPOSABLE) ×2 IMPLANT
UNDERPAD 30X36 HEAVY ABSORB (UNDERPADS AND DIAPERS) ×2 IMPLANT
WATER STERILE IRR 1000ML POUR (IV SOLUTION) ×2 IMPLANT

## 2021-01-19 NOTE — Anesthesia Procedure Notes (Signed)
Procedure Name: LMA Insertion Date/Time: 01/19/2021 10:22 AM Performed by: Glynda Jaeger, CRNA Pre-anesthesia Checklist: Patient identified, Emergency Drugs available, Suction available and Patient being monitored Patient Re-evaluated:Patient Re-evaluated prior to induction Oxygen Delivery Method: Circle System Utilized Preoxygenation: Pre-oxygenation with 100% oxygen Induction Type: IV induction Ventilation: Mask ventilation without difficulty LMA: LMA inserted LMA Size: 5.0 Number of attempts: 1 Airway Equipment and Method: Bite block Placement Confirmation: positive ETCO2 Tube secured with: Tape Dental Injury: Teeth and Oropharynx as per pre-operative assessment  Comments: First attempt with LMA 4, large leak noted. Switched to LMA 5. +ETCO2 and adequate TV.

## 2021-01-19 NOTE — Transfer of Care (Signed)
Immediate Anesthesia Transfer of Care Note  Patient: [name]  Procedure(s) Performed: ARTERIOVENOUS (AV) FISTULA OF LEFT ARM  USING GRAFT (Left)  Patient Location: PACU  Anesthesia Type:General  Level of Consciousness: patient cooperative and responds to stimulation  Airway & Oxygen Therapy: Patient Spontanous Breathing  Post-op Assessment: Report given to RN and Post -op Vital signs reviewed and stable  Post vital signs: Reviewed and stable  Last Vitals:  Vitals Value Taken Time  BP 107/58 01/19/21 1230  Temp    Pulse 56 01/19/21 1234  Resp 20 01/19/21 1234  SpO2 91 % 01/19/21 1234  Vitals shown include unvalidated device data.  Last Pain:  Vitals:   01/19/21 0902  TempSrc:   PainSc: 0-No pain      Patients Stated Pain Goal: 0 (57/01/77 9390)  Complications: No notable events documented.

## 2021-01-19 NOTE — Progress Notes (Signed)
HD#8 SUBJECTIVE:  Patient Summary: [name] is a 70 y.o. with a pertinent PMH of HTN, T2DM, PAD s/p bilateral BKAs, CAD s/p CABG, HFrEF, and ESRD on HD who presented with bilateral lower extremity swelling and admitted for acute on chronic HFrEF complicated by CKD V progression to ESRD on HD.   Overnight Events: No acute events overnight  Interim History: This is hospital day 8 for [name] who was seen and evaluated at the bedside this afternoon, as he was in the OR getting his AV fistula placed this morning. He has no acute complaints but is hungry now, after being NPO since midnight. Denies any pain at AV fistula site. Denies SOB.   OBJECTIVE:  Vital Signs: Vitals:   01/18/21 0600 01/18/21 0925 01/18/21 1647 01/18/21 2113  BP:  (!) 119/58 120/62 106/63  Pulse:  62 63 67  Resp:  16 18 16   Temp:  99.2 F (37.3 C) 98.6 F (37 C) 99.8 F (37.7 C)  TempSrc:  Oral Oral Oral  SpO2:  95% 97% 98%  Weight: 99.2 kg     Height:       Supplemental O2: Room Air SpO2: 98 %   Filed Weights   01/17/21 0716 01/17/21 1038 01/18/21 0600  Weight: 100.5 kg 99.1 kg 99.2 kg     Intake/Output Summary (Last 24 hours) at 01/19/2021 0524 Last data filed at 01/18/2021 1700 Gross per 24 hour  Intake 574 ml  Output --  Net 574 ml   Net IO Since Admission: -494.06 mL [01/19/21 0524]  Physical Exam:  General: No acute distress. Head: Normocephalic. Atraumatic. Neck: R tunneled cath in place CV: RRR. No murmurs, rubs, or gallops. No LE edema Pulmonary: Lungs CTAB. Normal effort. No wheezing or rales. Abdominal: Soft, nontender, nondistended. Normal bowel sounds. Extremities: Palpable radial pulses. S/p bilateral BKA. LUE AV fistula placed today Skin: Cool and dry.  Neuro: A&Ox3. No focal deficit. Psych: Normal mood and affect    ASSESSMENT/PLAN:  Assessment: Active Problems:   Goals of care, counseling/discussion   Acute on chronic congestive heart failure (HCC)    Ventricular tachycardia, non-sustained   ESRD needing dialysis [facility])   Plan: #ESRD on HD Patient with CKD V that has progressed to ESRD on HD this admission. Patient no longer producing any urine. AV fistula placed today for long term dialysis access. Left hand is cool to touch after surgery, and will continue to monitor for steal syndrome. Patient is s/p 3 HD sessions and will receive HD tomorrow. He is set up for outpatient HD at [facility], however, patient may go to [facility], which could affect HD plans.  - Nephrology consulted, appreciate recs - Hold hydralazine in the setting of hypotension with dialysis - Continue to monitor renal function panel  #Acute on chronic HFrEF (EF 30-35%) Patient presented initially with volume overload with SOB and lower extremity edema, secondary to HFrEF exacerbation complicated by ESRD. Patient appears euvolemic on exam today.  - Continue metoprolol, amio, and imdur - Hold hydralazine as above  #Elevated LFTs #Congestive hepatopathy  LFTs have been down trending, however, were not checked today. Elevated LFTs likely secondary to shock liver vs elevation from amiodarone therapy.  - CMP in the AM  #Anemia of chronic disease Hb has been stable at 8.9. Elevated ferritin is likely secondary to acute illness requiring temporary ICU admission. - Continue to monitor CBC  #Thrombocytopenia Plt dropped >50% 3 days after heparin was initiated. 4T score puts  patient at low-intermediate risk of HIT, however HIT ab pending. No evidence of bleeding or thrombosis. - Continue to monitor CBC - HIT Ab pending - Holding heparin for dvt prophylaxis and with HD  #Bilateral BKAs Patient has had decreased mobility 2/2 to weakness, decreased balance, and edema in his lower extremities, which has prevented him from being able to utilize is prostheses. Recommended for CIR for patient to work on transfers to/from wheelchair. With volume status fluctuating, patient will  benefit from rehab to learn to perform ADLs with and without his prostheses.   Best Practice: Diet: NPO IVF: Fluids: none VTE: Held Code: Full AB: None Therapy Recs: CIR, DME: other sliding board DISPO: Anticipated discharge to  CIR/SNF  pending Medical stability.  Signature: Buddy Duty, D.O.  Internal Medicine Resident, PGY-1 [facility]  Pager: [phone] 5:24 AM, 01/19/2021   Please contact the on call pager after 5 pm and on weekends at [phone].

## 2021-01-19 NOTE — Progress Notes (Signed)
Patient ID: [name], male   DOB: 06/10/1950, 71 y.o.   MRN: [id number] S: S/p LUE AVG placement.  No complaints but does state that he would like to go to [facility] after discharge. O:BP 119/67 (BP Location: Right Arm)   Pulse 66   Temp 97.7 F (36.5 C)   Resp 16   Ht 5\' 8"  (1.727 m)   Wt 98.9 kg   SpO2 100%   BMI 33.15 kg/m   Intake/Output Summary (Last 24 hours) at 01/19/2021 1332 Last data filed at 01/19/2021 1200 Gross per 24 hour  Intake 677 ml  Output --  Net 677 ml   Intake/Output: I/O last 3 completed shifts: In: 574 [P.O.:574] Out: -   Intake/Output this shift:  Total I/O In: 500 [I.V.:500] Out: -  Weight change: 0.2 kg Gen:NAD CVS: RRR Resp:CTA Abd: +BS, soft, Nt/ND Ext: s/p bilateral BKA's, LUE AVG +T/B, cool left hand, no pain  Recent Labs  Lab 01/12/21 1506 01/12/21 1513 01/13/21 1454 01/14/21 0119 01/15/21 0129 01/16/21 0301 01/17/21 0735 01/18/21 0432 01/19/21 0458  NA 140   < > 138 136 134* 133* 128* 130* 131*  K 3.9   < > 4.2 4.5 4.1 3.7 3.6 3.6 3.7  CL 102   < > 100 98 98 97* 94* 96* 97*  CO2 21*   < > 22 23 23 24 22 22  20*  GLUCOSE 168*   < > 208* 158* 118* 159* 162* 128* 192*  BUN 45*   < > 52* 57* 50* 45* 56* 41* 61*  CREATININE 4.83*   < > 5.67* 5.84* 5.31* 5.15* 6.56* 5.28* 6.70*  ALBUMIN  --   --   --   --  3.1* 2.7* 2.9* 2.7*  --   CALCIUM 8.8*   < > 9.1 9.2 8.5* 8.0* 8.0* 7.9* 8.2*  PHOS 4.9*  --   --  5.1* 4.0  --  4.2  --   --   AST  --   --   --   --  1,221* 723*  --  755*  --   ALT  --   --   --   --  1,123* 699*  --  480*  --    < > = values in this interval not displayed.   Liver Function Tests: Recent Labs  Lab 01/15/21 0129 01/16/21 0301 01/17/21 0735 01/18/21 0432  AST 1,221* 723*  --  755*  ALT 1,123* 699*  --  480*  ALKPHOS 93 98  --  100  BILITOT 3.1* 3.5*  --  3.6*  PROT 6.1* 5.5*  --  5.8*  ALBUMIN 3.1* 2.7* 2.9* 2.7*   No results for input(s): LIPASE, AMYLASE in the last 168 hours. No  results for input(s): AMMONIA in the last 168 hours. CBC: Recent Labs  Lab 01/14/21 0519 01/15/21 0129 01/16/21 0301 01/17/21 0735 01/18/21 0432  WBC 8.0 8.6 8.0 8.0 7.9  HGB 10.1* 9.6* 9.5* 8.9* 8.9*  HCT 32.0* 30.2* 29.1* 27.0* 27.3*  MCV 90.7 90.4 89.8 90.6 89.2  PLT 83* 80* 90* 78* 76*   Cardiac Enzymes: No results for input(s): CKTOTAL, CKMB, CKMBINDEX, TROPONINI in the last 168 hours. CBG: Recent Labs  Lab 01/18/21 1144 01/18/21 1643 01/18/21 2138 01/19/21 0634 01/19/21 1231  GLUCAP 147* 176* 198* 176* 155*    Iron Studies:  Recent Labs    01/16/21 1353 01/17/21 1714  IRON 59  --   TIBC 188*  --  TRANSFERRIN  --  147*  FERRITIN >7,500*  --    Studies/Results: VAS [place] UPPER EXT VEIN MAPPING (PRE-OP AVF)  Result Date: 01/18/2021 UPPER EXTREMITY VEIN MAPPING Patient Name:  [name]  Date of Exam:   01/17/2021 Medical Rec #: [id number]       Accession #:    [id number] Date of Birth: 1950-04-12        Patient Gender: M Patient Age:   12 years Exam Location:  [facility] Procedure:      VAS [place] UPPER EXT VEIN MAPPING (PRE-OP AVF) Referring Phys: Servando Snare --------------------------------------------------------------------------------  Indications: Pre Dialysis access. History: CKD IV, CHF, HLD, HTN, DM, History of MI and stroke.  Limitations: IV lines, bandages, edema Comparison Study: No prior study on file Performing Technologist: Sharion Dove RVS  Examination Guidelines: A complete evaluation includes B-mode imaging, spectral Doppler, color Doppler, and power Doppler as needed of all accessible portions of each vessel. Bilateral testing is considered an integral part of a complete examination. Limited examinations for reoccurring indications may be performed as noted. +-----------------+-------------+----------+----------+ Right Cephalic   Diameter (cm)Depth (cm) Findings  +-----------------+-------------+----------+----------+ Prox upper arm        0.21        0.43              +-----------------+-------------+----------+----------+ Mid upper arm        0.21        0.38              +-----------------+-------------+----------+----------+ Dist upper arm       0.22        0.44              +-----------------+-------------+----------+----------+ Antecubital fossa    0.34        0.29   IV present +-----------------+-------------+----------+----------+ Prox forearm      0.24/0.180  0.42/0.397branching  +-----------------+-------------+----------+----------+ Mid forearm       0.29/0.247  0.28/0.241branching  +-----------------+-------------+----------+----------+ Dist forearm      0.23/0.223  0.20/0.223branching  +-----------------+-------------+----------+----------+ Wrist                0.22        0.25              +-----------------+-------------+----------+----------+ +-----------------+----------------+--------------+--------------+ Right Basilic     Diameter (cm)    Depth (cm)     Findings    +-----------------+----------------+--------------+--------------+ Prox upper arm         0.38           1.93         origin     +-----------------+----------------+--------------+--------------+ Mid upper arm          0.38           1.90                    +-----------------+----------------+--------------+--------------+ Dist upper arm         0.39           1.35                    +-----------------+----------------+--------------+--------------+ Antecubital fossa0.30/0.259/0.2051.19/1.22/1.37  branching    +-----------------+----------------+--------------+--------------+ Prox forearm     0.30/0.63/0.246 1.18/1.09/1.29  branching    +-----------------+----------------+--------------+--------------+ Mid forearm            0.22           0.32                    +-----------------+----------------+--------------+--------------+  Distal forearm                                 not visualized  +-----------------+----------------+--------------+--------------+ Wrist                                          not visualized +-----------------+----------------+--------------+--------------+ +-----------------+-------------+-----------+---------+ Left Cephalic    Diameter (cm)Depth (cm) Findings  +-----------------+-------------+-----------+---------+ Prox upper arm       0.43        0.78    branching +-----------------+-------------+-----------+---------+ Mid upper arm        0.27        0.48              +-----------------+-------------+-----------+---------+ Dist upper arm       0.30        0.56              +-----------------+-------------+-----------+---------+ Antecubital fossa    0.34        0.16              +-----------------+-------------+-----------+---------+ Prox forearm         0.23        0.30              +-----------------+-------------+-----------+---------+ Mid forearm          0.13        0.44    branching +-----------------+-------------+-----------+---------+ Dist forearm      0.12/0.151  0.19/0.288 branching +-----------------+-------------+-----------+---------+ Wrist             0.20/0.192  0.27/0.511           +-----------------+-------------+-----------+---------+ +-----------------+-------------+----------+---------+ Left Basilic     Diameter (cm)Depth (cm)Findings  +-----------------+-------------+----------+---------+ Mid upper arm        0.45        2.63    origin   +-----------------+-------------+----------+---------+ Dist upper arm       0.42        2.64             +-----------------+-------------+----------+---------+ Antecubital fossa    0.37        2.35             +-----------------+-------------+----------+---------+ Prox forearm      0.38/0.240  2.10/1.57 branching +-----------------+-------------+----------+---------+ Mid forearm          0.19        0.34              +-----------------+-------------+----------+---------+ Distal forearm       0.17        0.28             +-----------------+-------------+----------+---------+ Wrist                0.21        0.15             +-----------------+-------------+----------+---------+ *See table(s) above for measurements and observations.  Diagnosing physician: Servando Snare MD Electronically signed by Servando Snare MD on 01/18/2021 at 9:11:11 AM.    Final     (feeding supplement) PROSource Plus  30 mL Oral BID BM   amiodarone  200 mg Oral BID   aspirin EC  81 mg Oral q morning   atorvastatin  40 mg Oral q morning   Chlorhexidine Gluconate Cloth  6 each Topical Q0600   Chlorhexidine  Gluconate Cloth  6 each Topical Q0600   clopidogrel  75 mg Oral Daily   feeding supplement (NEPRO CARB STEADY)  237 mL Oral BID BM   gabapentin  300 mg Oral QHS   insulin aspart  0-15 Units Subcutaneous TID WC   isosorbide mononitrate  30 mg Oral Daily   metoprolol succinate  50 mg Oral q morning   multivitamin  1 tablet Oral QHS   pantoprazole  40 mg Oral Q0600   tamsulosin  0.4 mg Oral q morning    BMET    Component Value Date/Time   NA 131 (L) 01/19/2021 0458   NA 135 07/31/2019 1433   K 3.7 01/19/2021 0458   CL 97 (L) 01/19/2021 0458   CO2 20 (L) 01/19/2021 0458   GLUCOSE 192 (H) 01/19/2021 0458   BUN 61 (H) 01/19/2021 0458   BUN 35 (H) 07/31/2019 1433   CREATININE 6.70 (H) 01/19/2021 0458   CALCIUM 8.2 (L) 01/19/2021 0458   GFRNONAA 8 (L) 01/19/2021 0458   GFRAA 19 (L) 10/12/2019 1548   CBC    Component Value Date/Time   WBC 7.9 01/18/2021 0432   RBC 3.06 (L) 01/18/2021 0432   HGB 8.9 (L) 01/18/2021 0432   HGB 9.4 (L) 02/06/2019 1016   HCT 27.3 (L) 01/18/2021 0432   HCT 28.4 (L) 02/06/2019 1016   PLT 76 (L) 01/18/2021 0432   PLT 199 02/06/2019 1016   MCV 89.2 01/18/2021 0432   MCV 86 02/06/2019 1016   MCH 29.1 01/18/2021 0432   MCHC 32.6 01/18/2021 0432   RDW 17.3 (H) 01/18/2021 0432   RDW 18.5  (H) 02/06/2019 1016   LYMPHSABS 1.5 01/11/2021 0851   LYMPHSABS 1.3 01/29/2019 1033   MONOABS 0.7 01/11/2021 0851   EOSABS 0.2 01/11/2021 0851   EOSABS 0.1 01/29/2019 1033   BASOSABS 0.0 01/11/2021 0851   BASOSABS 0.0 01/29/2019 1033     Assessment/Plan:  New ESRD - progression likely exacerbated by cardiorenal syndrome. S/p 3 HD sessions and due for HD tomorrow.  He is currently set up for TTS at [facility], however if he is going to [facility], this may change his center. Chronic systolic CHF - improved volume status with HD and UF. 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 Thrombocytopenia - no heparin Abnormal LFT's- per primary. HTN -stable Vascular access -s /p LUE AVG placement 01/19/21 with cold hand but no pain.  Continue to follow for evidence of steal syndrome Disposition - for CIR pending insurance clearance.  Set up for HD at [facility] on TTS schedule, however if he is going to SNF this may change.   Donetta Potts, MD [employer] [phone]

## 2021-01-19 NOTE — Op Note (Signed)
    NAME: [name]    MRN: [id number] DOB: [birth year]    DATE OF OPERATION: 01/19/2021  PREOP DIAGNOSIS:    End-stage renal disease  POSTOP DIAGNOSIS:    Same  PROCEDURE:    Left upper arm loop AV graft (4-7 mm PTFE graft)  SURGEON: Judeth Cornfield. Scot Dock, MD  ASSIST: Laurence Slate, PA  ANESTHESIA: General  EBL: 50 cc  INDICATIONS:    [name] is a 70 y.o. male who presents for new access.  FINDINGS:   He had a high bifurcation of the brachial artery.  The artery and axilla was only about 3-1/2 mm.  Therefore placed an upper arm loop graft of note his blood pressures running low.  TECHNIQUE:   The patient was taken to the operating room and I looked at the vessels with the SonoSite.  He had a high bifurcation of the brachial artery.  The vein was somewhat marginal in size and fairly far lateral.  A transverse incision was made just above the antecubital level here the cephalic vein was small and I did not think he was a candidate for a fistula.  Given that he had a high bifurcation of the brachial artery I elected to place an upper arm loop.  A separate longitudinal incision was made beneath the axilla after the skin was anesthetized.  The dissection was carried down to the high brachial vein and adjacent brachial artery.  The artery was only about 3-1/2 mm.  A 4-7 mm PTFE graft was tunneled in a loop fashion in the upper arm with the arterial aspect of the graft along the lateral aspect of the upper arm.  Patient was heparinized.  The brachial artery was clamped proximally and distally and a longitudinal arteriotomy was made.  Only a short segment of the 4 mm of the graft was excised, the graft spatulated and sewn end-to-side to the brachial artery using continuous 6-0 Prolene suture.  The graft was imported the appropriate length for anastomosis to the high brachial vein.  The vein was ligated distally and spatulated.  The graft cut to the appropriate length spatulated  and sewn into into the vein using 2 continuous 6-0 Prolene sutures.  At the completion there was a palpable thrill in the graft with a radial and ulnar signal with the Doppler.  There was some generalized oozing and it took some time to dry this up as he has thrombocytopenia.  Once hemostasis was obtained the incision at the antecubital level was closed with a deep layer of 3-0 Vicryl and the skin closed with 4-0 Monocryl.  The incision in the axilla was closed with 2 deep layers of 3-0 Vicryl and the skin closed with 4-0 Monocryl.  Dermabond was applied.  The patient tolerated the procedure well was transferred to the recovery room in stable condition.  All needle and sponge counts were correct.  Given the complexity of the case a first assistant was necessary in order to expedient the procedure and safely perform the technical aspects of the operation.  [name], MD, FACS [facility]  DATE OF DICTATION:   01/19/2021

## 2021-01-19 NOTE — Care Management Important Message (Signed)
Important Message  Patient Details  Name: [name] MRN: [id number] Date of Birth: 12-26-1950   Medicare Important Message Given:  Yes     Lecretia Buczek 01/19/2021, 4:21 PM

## 2021-01-19 NOTE — Plan of Care (Signed)
  Problem: Education: Goal: Knowledge of General Education information will improve Description: Including pain rating scale, medication(s)/side effects and non-pharmacologic comfort measures Outcome: Progressing   Problem: Clinical Measurements: Goal: Will remain free from infection Outcome: Progressing Goal: Respiratory complications will improve Outcome: Progressing   Problem: Nutrition: Goal: Adequate nutrition will be maintained Outcome: Progressing   Problem: Pain Managment: Goal: General experience of comfort will improve Outcome: Progressing

## 2021-01-19 NOTE — Progress Notes (Signed)
Met with pt at bedside to discuss out-pt HD arrangements. Information sheet provided to pt with clinic's name,address, phone number, and days/time. Pt requested that information be placed in belongings bag in pt's room. HD arrangements added to AVS as well. Pt had mentioned to MD going to snf at d/c. Inquired of pt if he was wanting snf vs CIR. At this time, pt prefers CIR. Discussed with RN CM who plans to f/u with pt as able. Will follow and assist as needed.  Melven Sartorius Renal Navigator [phone]

## 2021-01-19 NOTE — Progress Notes (Signed)
PT Cancellation Note  Patient Details Name: ANDREY MCCASKILL MRN: [id number] DOB: 1950-12-30   Cancelled Treatment:    Reason Eval/Treat Not Completed: Patient at procedure or test/unavailable (receiving AV fistula).  Wyona Almas, PT, DPT Acute Rehabilitation Services Pager [phone] Office [phone]    Deno Etienne 01/19/2021, 9:17 AM

## 2021-01-19 NOTE — Plan of Care (Signed)

## 2021-01-19 NOTE — Anesthesia Preprocedure Evaluation (Signed)
Anesthesia Evaluation  Patient identified by MRN, date of birth, ID band Patient awake    Reviewed: Allergy & Precautions, H&P , NPO status , Patient's Chart, lab work & pertinent test results  Airway Mallampati: II   Neck ROM: full    Dental   Pulmonary former smoker,    breath sounds clear to auscultation       Cardiovascular hypertension, + Past MI, + CABG and +CHF   Rhythm:regular Rate:Normal     Neuro/Psych PSYCHIATRIC DISORDERS Depression CVA    GI/Hepatic GERD  ,  Endo/Other  diabetes, Type 2  Renal/GU ESRFRenal disease     Musculoskeletal   Abdominal   Peds  Hematology  (+) Blood dyscrasia, anemia ,   Anesthesia Other Findings   Reproductive/Obstetrics                             Anesthesia Physical Anesthesia Plan  ASA: 3  Anesthesia Plan: General   Post-op Pain Management:    Induction: Intravenous  PONV Risk Score and Plan: 2 and Ondansetron, Dexamethasone and Treatment may vary due to age or medical condition  Airway Management Planned: LMA  Additional Equipment:   Intra-op Plan:   Post-operative Plan: Extubation in OR  Informed Consent: I have reviewed the patients History and Physical, chart, labs and discussed the procedure including the risks, benefits and alternatives for the proposed anesthesia with the patient or authorized representative who has indicated his/her understanding and acceptance.     Dental advisory given  Plan Discussed with: CRNA, Anesthesiologist and Surgeon  Anesthesia Plan Comments:         Anesthesia Quick Evaluation

## 2021-01-19 NOTE — Progress Notes (Signed)
Dr. Scot Dock notified of pt last Plavix dose yesterday morning 0935. OK to proceed per surgeon.

## 2021-01-19 NOTE — Progress Notes (Signed)
inpatient Rehab Admissions Coordinator:   Pt. Is not medically ready for Cir at this time. Pt. I will continue to follow for potential admit pending insurance auth, medical readiness. and bed availability.   Clemens Catholic, [place], [place] Admissions Coordinator  [phone] ([place]) [phone] (office)

## 2021-01-19 NOTE — Interval H&P Note (Signed)
History and Physical Interval Note:  01/19/2021 9:33 AM  [name]  has presented today for surgery, with the diagnosis of CKD.  The various methods of treatment have been discussed with the patient and family. After consideration of risks, benefits and other options for treatment, the patient has consented to  Procedure(s): ARTERIOVENOUS (AV) FISTULA vs. GRAFT ARM (Left) as a surgical intervention.  The patient's history has been reviewed, patient examined, no change in status, stable for surgery.  I have reviewed the patient's chart and labs.  Questions were answered to the patient's satisfaction.     [name]

## 2021-01-19 NOTE — Progress Notes (Signed)
OT Cancellation Note  Patient Details Name: [name] MRN: [id number] DOB: 07-06-1950   Cancelled Treatment:    Reason Eval/Treat Not Completed: Patient declined, no reason specified (patient was out most of day for AV fistula and stated he was too tired to participate) Lodema Hong, [place]  Pager [phone] Office [phone]  Trixie Dredge 01/19/2021, 2:57 PM

## 2021-01-20 ENCOUNTER — Inpatient Hospital Stay (HOSPITAL_COMMUNITY)
Admission: RE | Admit: 2021-01-20 | Discharge: 2021-01-20 | Disposition: A | Discharge status: Home / Self Care | Payer: Medicare Other | Source: Ambulatory Visit | Attending: Nephrology | Admitting: Nephrology

## 2021-01-20 ENCOUNTER — Encounter (HOSPITAL_COMMUNITY): Payer: Self-pay | Admitting: Vascular Surgery

## 2021-01-20 DIAGNOSIS — I5023 Acute on chronic systolic (congestive) heart failure: Secondary | ICD-10-CM | POA: Diagnosis not present

## 2021-01-20 DIAGNOSIS — Z9889 Other specified postprocedural states: Secondary | ICD-10-CM

## 2021-01-20 DIAGNOSIS — N400 Enlarged prostate without lower urinary tract symptoms: Secondary | ICD-10-CM | POA: Diagnosis not present

## 2021-01-20 DIAGNOSIS — N186 End stage renal disease: Secondary | ICD-10-CM | POA: Diagnosis not present

## 2021-01-20 DIAGNOSIS — Z992 Dependence on renal dialysis: Secondary | ICD-10-CM | POA: Diagnosis not present

## 2021-01-20 LAB — COMPREHENSIVE METABOLIC PANEL
ALT: 172 U/L — ABNORMAL HIGH (ref 0–44)
AST: 294 U/L — ABNORMAL HIGH (ref 15–41)
Albumin: 2.8 g/dL — ABNORMAL LOW (ref 3.5–5.0)
Alkaline Phosphatase: 115 U/L (ref 38–126)
Anion gap: 15 (ref 5–15)
BUN: 77 mg/dL — ABNORMAL HIGH (ref 8–23)
CO2: 20 mmol/L — ABNORMAL LOW (ref 22–32)
Calcium: 8.2 mg/dL — ABNORMAL LOW (ref 8.9–10.3)
Chloride: 94 mmol/L — ABNORMAL LOW (ref 98–111)
Creatinine, Ser: 8.08 mg/dL — ABNORMAL HIGH (ref 0.61–1.24)
GFR, Estimated: 7 mL/min — ABNORMAL LOW (ref >60)
Glucose, Bld: 299 mg/dL — ABNORMAL HIGH (ref 70–99)
Potassium: 4.8 mmol/L (ref 3.5–5.1)
Sodium: 129 mmol/L — ABNORMAL LOW (ref 135–145)
Total Bilirubin: 3.1 mg/dL — ABNORMAL HIGH (ref 0.3–1.2)
Total Protein: 6.3 g/dL — ABNORMAL LOW (ref 6.5–8.1)

## 2021-01-20 LAB — GLUCOSE, CAPILLARY
Glucose-Capillary: 311 mg/dL — ABNORMAL HIGH (ref 70–99)
Glucose-Capillary: 176 mg/dL — ABNORMAL HIGH (ref 70–99)
Glucose-Capillary: 196 mg/dL — ABNORMAL HIGH (ref 70–99)
Glucose-Capillary: 235 mg/dL — ABNORMAL HIGH (ref 70–99)

## 2021-01-20 LAB — CBC
HCT: 27 % — ABNORMAL LOW (ref 39.0–52.0)
Hemoglobin: 8.7 g/dL — ABNORMAL LOW (ref 13.0–17.0)
MCH: 29.1 pg (ref 26.0–34.0)
MCHC: 32.2 g/dL (ref 30.0–36.0)
MCV: 90.3 fL (ref 80.0–100.0)
Platelets: 81 10*3/uL — ABNORMAL LOW (ref 150–400)
RBC: 2.99 MIL/uL — ABNORMAL LOW (ref 4.22–5.81)
RDW: 17.2 % — ABNORMAL HIGH (ref 11.5–15.5)
WBC: 13.4 10*3/uL — ABNORMAL HIGH (ref 4.0–10.5)
nRBC: 0.2 % (ref 0.0–0.2)

## 2021-01-20 MED ORDER — MELATONIN 3 MG PO TABS
3.0000 mg | ORAL_TABLET | Freq: Every evening | ORAL | Status: DC | PRN
  Administered 2021-01-20 – 2021-01-24 (×3): 3 mg via ORAL
  Filled 2021-01-20 (×3): qty 1

## 2021-01-20 MED ORDER — HEPARIN SODIUM (PORCINE) 1000 UNIT/ML IJ SOLN
INTRAMUSCULAR | Status: AC
  Administered 2021-01-20: 1000 [IU]
  Filled 2021-01-20: qty 4

## 2021-01-20 NOTE — Plan of Care (Signed)
Nutrition Education Note  RD consulted for Renal Education. Provided "Chronic Kidney Disease Stages 3-5," Renal Food Pyramid, and Sample Menu for Patients on Dialysis with Diabetes handouts. Reviewed food groups and provided recommended serving sizes specifically determined for patient's current nutritional status.   Explained why diet restrictions are needed and provided lists of foods to limit/avoid that are high potassium, sodium, and phosphorus. Provided specific recommendations on safer alternatives of these foods. Strongly encouraged compliance of this diet.   Discussed importance of protein intake at each meal and snack. Provided examples of how to maximize protein intake throughout the day. Discussed need for fluid restriction with dialysis, importance of minimizing weight gain between HD treatments, and renal-friendly beverage options.  Encouraged pt to discuss specific diet questions/concerns with RD at HD outpatient facility. Teach back method used.  Expect fair compliance.  Body mass index is 34.86 kg/m. Pt meets criteria for Obesity Class 1 based on current BMI.  Current diet order is Renal/Carb Modified, patient is consuming approximately 50-100% of meals at this time. Labs and medications reviewed.   No further nutrition interventions warranted at this time. RD contact information provided. If additional nutrition issues arise, please re-consult RD.  Derrel Nip, RD, LDN (she/her/hers) Clinical Inpatient Dietitian RD Pager/After-Hours/Weekend Pager # in [place]

## 2021-01-20 NOTE — Procedures (Signed)
I was present at this dialysis session. I have reviewed the session itself and made appropriate changes.   Vital signs in last 24 hours:  Temp:  [97 F (36.1 C)-99.5 F (37.5 C)] 97.9 F (36.6 C) (11/14 2129) Pulse Rate:  [59-70] 59 (11/14 2129) Resp:  [14-24] 18 (11/14 2129) BP: (111-119)/(54-67) 111/58 (11/14 2129) SpO2:  [91 %-100 %] 93 % (11/14 2129) Weight:  [105.8 kg] 105.8 kg (11/15 0500) Weight change: -1.816 kg Filed Weights   01/19/21 0600 01/19/21 0856 01/20/21 0500  Weight: 100.7 kg 98.9 kg 105.8 kg    Recent Labs  Lab 01/17/21 0735 01/18/21 0432 01/20/21 0356  NA 128*   < > 129*  K 3.6   < > 4.8  CL 94*   < > 94*  CO2 22   < > 20*  GLUCOSE 162*   < > 299*  BUN 56*   < > 77*  CREATININE 6.56*   < > 8.08*  CALCIUM 8.0*   < > 8.2*  PHOS 4.2  --   --    < > = values in this interval not displayed.    Recent Labs  Lab 01/17/21 0735 01/18/21 0432 01/20/21 0356  WBC 8.0 7.9 13.4*  HGB 8.9* 8.9* 8.7*  HCT 27.0* 27.3* 27.0*  MCV 90.6 89.2 90.3  PLT 78* 76* 81*    Scheduled Meds:  (feeding supplement) PROSource Plus  30 mL Oral BID BM   amiodarone  200 mg Oral BID   aspirin EC  81 mg Oral q morning   atorvastatin  40 mg Oral q morning   Chlorhexidine Gluconate Cloth  6 each Topical Q0600   Chlorhexidine Gluconate Cloth  6 each Topical Q0600   clopidogrel  75 mg Oral Daily   feeding supplement (NEPRO CARB STEADY)  237 mL Oral BID BM   gabapentin  300 mg Oral QHS   insulin aspart  0-15 Units Subcutaneous TID WC   isosorbide mononitrate  30 mg Oral Daily   metoprolol succinate  50 mg Oral q morning   multivitamin  1 tablet Oral QHS   pantoprazole  40 mg Oral Q0600   sodium bicarbonate  650 mg Oral BID   tamsulosin  0.4 mg Oral q morning   Continuous Infusions:  sodium chloride Stopped (01/13/21 1834)   PRN Meds:.sodium chloride, acetaminophen **OR** acetaminophen, nitroGLYCERIN, oxyCODONE-acetaminophen, senna-docusate    Assessment/Plan:   New  ESRD - progression likely exacerbated by cardiorenal syndrome. S/p 3 HD sessions and due for HD tomorrow.  He is currently set up for TTS at [facility], however if he is going to [facility], this may change his center. Chronic systolic CHF - improved volume status with HD and UF. 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 Thrombocytopenia - no heparin Abnormal LFT's- per primary. HTN -stable Vascular access -s /p LUE AVG placement 01/19/21 with some edema but no pain.  Continue to follow for evidence of steal syndrome Disposition - for CIR pending insurance clearance.  Set up for HD at [facility] on TTS schedule and will continue with that schedule while he remains an inpatient.     Donetta Potts,  MD 01/20/2021, 9:09 AM

## 2021-01-20 NOTE — Anesthesia Postprocedure Evaluation (Signed)
Anesthesia Post Note  Patient: [name]  Procedure(s) Performed: ARTERIOVENOUS (AV) FISTULA OF LEFT ARM  USING GRAFT (Left)     Patient location during evaluation: PACU Anesthesia Type: General Level of consciousness: awake and alert Pain management: pain level controlled Vital Signs Assessment: post-procedure vital signs reviewed and stable Respiratory status: spontaneous breathing, nonlabored ventilation, respiratory function stable and patient connected to nasal cannula oxygen Cardiovascular status: blood pressure returned to baseline and stable Postop Assessment: no apparent nausea or vomiting Anesthetic complications: no   No notable events documented.  Last Vitals:  Vitals:   01/19/21 1655 01/19/21 2129  BP: (!) 111/54 (!) 111/58  Pulse: 60 (!) 59  Resp: 19 18  Temp: 36.4 C 36.6 C  SpO2: 91% 93%    Last Pain:  Vitals:   01/20/21 0630  TempSrc:   PainSc: 0-No pain                 Yasiel Goyne S

## 2021-01-20 NOTE — Progress Notes (Addendum)
Progress Note    01/20/2021 7:43 AM 1 Day Post-Op  Subjective:  Denies hand pain or numbness   Vitals:   01/19/21 1655 01/19/21 2129  BP: (!) 111/54 (!) 111/58  Pulse: 60 (!) 59  Resp: 19 18  Temp: 97.6 F (36.4 C) 97.9 F (36.6 C)  SpO2: 91% 93%    Physical Exam: SUBJECTIVE:General appearance: awake, alert in NAD Chest: catheter site without bleeding or erythema. Dressing dry and intact. Respirations: unlabored; no dyspnea at rest Left upper extremity: Moderate edema of arm. Fingers are slightly cool to touch with 5/5 grip strength. Motor function and sensation intact. Good bruit in graft. Incision(s): Well approximated. No active bleeding or hematoma.    CBC    Component Value Date/Time   WBC 13.4 (H) 01/20/2021 0356   RBC 2.99 (L) 01/20/2021 0356   HGB 8.7 (L) 01/20/2021 0356   HGB 9.4 (L) 02/06/2019 1016   HCT 27.0 (L) 01/20/2021 0356   HCT 28.4 (L) 02/06/2019 1016   PLT 81 (L) 01/20/2021 0356   PLT 199 02/06/2019 1016   MCV 90.3 01/20/2021 0356   MCV 86 02/06/2019 1016   MCH 29.1 01/20/2021 0356   MCHC 32.2 01/20/2021 0356   RDW 17.2 (H) 01/20/2021 0356   RDW 18.5 (H) 02/06/2019 1016   LYMPHSABS 1.5 01/11/2021 0851   LYMPHSABS 1.3 01/29/2019 1033   MONOABS 0.7 01/11/2021 0851   EOSABS 0.2 01/11/2021 0851   EOSABS 0.1 01/29/2019 1033   BASOSABS 0.0 01/11/2021 0851   BASOSABS 0.0 01/29/2019 1033    BMET    Component Value Date/Time   NA 129 (L) 01/20/2021 0356   NA 135 07/31/2019 1433   K 4.8 01/20/2021 0356   CL 94 (L) 01/20/2021 0356   CO2 20 (L) 01/20/2021 0356   GLUCOSE 299 (H) 01/20/2021 0356   BUN 77 (H) 01/20/2021 0356   BUN 35 (H) 07/31/2019 1433   CREATININE 8.08 (H) 01/20/2021 0356   CALCIUM 8.2 (L) 01/20/2021 0356   GFRNONAA 7 (L) 01/20/2021 0356   GFRAA 19 (L) 10/12/2019 1548     Intake/Output Summary (Last 24 hours) at 01/20/2021 0743 Last data filed at 01/19/2021 2200 Gross per 24 hour  Intake 1159.67 ml  Output --  Net  1159.67 ml    HOSPITAL MEDICATIONS Scheduled Meds:  (feeding supplement) PROSource Plus  30 mL Oral BID BM   amiodarone  200 mg Oral BID   aspirin EC  81 mg Oral q morning   atorvastatin  40 mg Oral q morning   Chlorhexidine Gluconate Cloth  6 each Topical Q0600   Chlorhexidine Gluconate Cloth  6 each Topical Q0600   clopidogrel  75 mg Oral Daily   feeding supplement (NEPRO CARB STEADY)  237 mL Oral BID BM   gabapentin  300 mg Oral QHS   insulin aspart  0-15 Units Subcutaneous TID WC   isosorbide mononitrate  30 mg Oral Daily   metoprolol succinate  50 mg Oral q morning   multivitamin  1 tablet Oral QHS   pantoprazole  40 mg Oral Q0600   sodium bicarbonate  650 mg Oral BID   tamsulosin  0.4 mg Oral q morning   Continuous Infusions:  sodium chloride Stopped (01/13/21 1834)   PRN Meds:.sodium chloride, acetaminophen **OR** acetaminophen, nitroGLYCERIN, oxyCODONE-acetaminophen, senna-docusate  Assessment and Plan: POD 1 placement of left upper arm AVG.  He has moderate edema of LUE and needs to keep elevated above heart level. We will plan to see  him in outpatient follow-up to observe resolution of edema prior to accessing graft.   Risa Grill, PA-C [facility] [phone] 01/20/2021  7:43 AM   I have interviewed the patient and examined the patient. I agree with the findings by the PA.  He has a good radial signal with the Doppler on the left.  His graft has a good bruit.  His incisions look fine.  He has moderate swelling and I agree with Risa Grill that he will need follow-up as an outpatient to follow his arm swelling.  If this did not resolve, then once the graft can be cannulated in 1 month we would consider a fistulogram to look for a central venous stenosis.  We will arrange follow-up and vascular surgery will be available as needed.  Gae Gallop, MD 8:25 AM   Gae Gallop, MD

## 2021-01-20 NOTE — Progress Notes (Signed)
Inpatient Diabetes Program Recommendations  AACE/ADA: New Consensus Statement on Inpatient Glycemic Control   Target Ranges:  Prepandial:   less than 140 mg/dL      Peak postprandial:   less than 180 mg/dL (1-2 hours)      Critically ill patients:  140 - 180 mg/dL   Results for COBBSMubarak, Bevens (MRN [id number]) as of 01/20/2021 11:49  Ref. Range 01/19/2021 06:34 01/19/2021 12:31 01/19/2021 16:57 01/19/2021 21:27 01/20/2021 06:44  Glucose-Capillary Latest Ref Range: 70 - 99 mg/dL 176 (H) 155 (H) 256 (H) 237 (H) 311 (H)    Review of Glycemic Control  Diabetes history: DM2 Outpatient Diabetes medications: None Current orders for Inpatient glycemic control: Novolog 0-15 units TID with meals  Inpatient Diabetes Program Recommendations:    Insulin: In reviewing chart, noted patient received Decadron 5 mg on 01/19/21 and no Novolog correction given that was scheduled for 12:00 and 17:00 on 01/19/21. Fasting glucose 311 mg/dl today and patient received Novolog 11 units for correction. Will follow trends and make recommendations if needed.  Thanks, Barnie Alderman, RN, MSN, CDE Diabetes Coordinator Inpatient Diabetes Program [phone] (Team Pager from 8am to 5pm)

## 2021-01-20 NOTE — Progress Notes (Signed)
Physical Therapy Treatment Patient Details Name: [name] MRN: [id number] DOB: 02-13-1951 Today's Date: 01/20/2021   History of Present Illness Pt adm 11/6 with acute on chronic HFrEF. Pt developed Vtach and transferred to ICU. Pt with continued worsening of CKD and going to require HD. L AVG placed 11/14. PMH: HTN, T2DM, previous CVA, s/p CABG x 4, HLD, CKD IV, HFrEF (last EF 35-40% in 2020), and bilateral BKAs    PT Comments    Session focused on bed mobility and transfer training from bed to recliner. Pt performing anterior posterior transfer with two person maximal assist. Still unable to don bilateral prosthetics due to residual limb swelling. Donned x 2 shrinkers for edema management. Pt demonstrated good activity tolerance throughout session. Pt continues with generalized weakness, decreased functional use of LUE in setting of recent AVG placement, balance deficits, and decreased cognition. Will benefit from inpatient rehab in order to maximize functional mobility and decrease caregiver burden.     Recommendations for follow up therapy are one component of a multi-disciplinary discharge planning process, led by the attending physician.  Recommendations may be updated based on patient status, additional functional criteria and insurance authorization.  Follow Up Recommendations  Acute inpatient rehab (3hours/day)     Assistance Recommended at Discharge Intermittent Supervision/Assistance  Equipment Recommendations  Other (comment) (sliding board, hoyer lift)    Recommendations for Other Services       Precautions / Restrictions Precautions Precautions: Fall;Other (comment) Precaution Comments: bilateral BKA Restrictions Weight Bearing Restrictions: No     Mobility  Bed Mobility Overal bed mobility: Needs Assistance Bed Mobility: Supine to Sit;Rolling Rolling: Min assist   Supine to sit: Min assist;HOB elevated     General bed mobility comments: Rolling multiple  times for peri care and pad change; pt requiring cues for sequencing and reaching with contralateral arm. able to progress to long sitting, pulling up with RUE from rail    Transfers Overall transfer level: Needs assistance Equipment used: None Transfers: Bed to chair/wheelchair/BSC         Anterior-Posterior transfers: Max assist;+2 physical assistance   General transfer comment: Use of bed pad to scoot hips back for anterior posterior transfer from bed to chair, cues for use of arm/hand placement    Ambulation/Gait               General Gait Details: Unable due to LE's too swollen for prosthetics to fit   Stairs             Wheelchair Mobility    Modified Rankin (Stroke Patients Only)       Balance Overall balance assessment: Needs assistance Sitting-balance support: Bilateral upper extremity supported;Feet unsupported Sitting balance-Leahy Scale: Poor                                      Cognition Arousal/Alertness: Awake/alert Behavior During Therapy: Flat affect Overall Cognitive Status: Impaired/Different from baseline Area of Impairment: Safety/judgement;Awareness;Problem solving                         Safety/Judgement: Decreased awareness of deficits;Decreased awareness of safety Awareness: Intellectual Problem Solving: Slow processing;Decreased initiation;Difficulty sequencing;Requires verbal cues General Comments: Pt stating he needs to get in a "tall wheelchair," to don his prosthetics; cannot explain rationale why he can't don on edge of bed. Difficulty with insight into deficits  Exercises      General Comments        Pertinent Vitals/Pain Pain Assessment: No/denies pain    Home Living                          Prior Function            PT Goals (current goals can now be found in the care plan section) Acute Rehab PT Goals Patient Stated Goal: Return to prior level of  function Potential to Achieve Goals: Fair Progress towards PT goals: Progressing toward goals    Frequency    Min 3X/week      PT Plan Current plan remains appropriate    Co-evaluation              AM-PAC PT "6 Clicks" Mobility   Outcome Measure  Help needed turning from your back to your side while in a flat bed without using bedrails?: A Little Help needed moving from lying on your back to sitting on the side of a flat bed without using bedrails?: A Little Help needed moving to and from a bed to a chair (including a wheelchair)?: Total Help needed standing up from a chair using your arms (e.g., wheelchair or bedside chair)?: Total Help needed to walk in hospital room?: Total Help needed climbing 3-5 steps with a railing? : Total 6 Click Score: 10    End of Session   Activity Tolerance: Patient tolerated treatment well Patient left: in chair;with call bell/phone within reach;with chair alarm set Nurse Communication: Mobility status;Need for lift equipment PT Visit Diagnosis: Other abnormalities of gait and mobility (R26.89);Muscle weakness (generalized) (M62.81)     Time: 0240-9735 PT Time Calculation (min) (ACUTE ONLY): 40 min  Charges:  $Therapeutic Activity: 38-52 mins                     Wyona Almas, PT, DPT Acute Rehabilitation Services Pager [phone] Office [phone]    Deno Etienne 01/20/2021, 4:40 PM

## 2021-01-20 NOTE — Progress Notes (Signed)
Spoke to Adin with CIR regarding consult. Mickel Baas to meet with pt this afternoon. Updated [place] regarding inpt rehab pending consult. Will follow and assist as needed.   Melven Sartorius Renal Navigator [phone]

## 2021-01-20 NOTE — Progress Notes (Addendum)
HD#9 SUBJECTIVE:  Patient Summary: [name] is a 70 y.o. with a pertinent PMH of HTN, T2DM, PAD s/p bilateral BKAs, CAD s/p CABG, HFrEF, and ESRD on HD who presented with bilateral lower extremity swelling and admitted for acute on chronic HFrEF complicated by CKD V progression to ESRD on HD.   Overnight Events: No acute events overnight  Interim History: This is hospital day 9 for [name] who was seen and evaluated at the bedside this morning. He reports that his breathing feels fine. He has no pain at the site of his AV graft placement and has been doing well with repeated dialysis sessions. No acute complaints today.   OBJECTIVE:  Vital Signs: Vitals:   01/19/21 1315 01/19/21 1337 01/19/21 1655 01/19/21 2129  BP: 119/67 (!) 112/58 (!) 111/54 (!) 111/58  Pulse: 66 64 60 (!) 59  Resp: 16 16 19 18   Temp: 97.7 F (36.5 C) 99.5 F (37.5 C) 97.6 F (36.4 C) 97.9 F (36.6 C)  TempSrc:   Oral Oral  SpO2: 100% 96% 91% 93%  Weight:      Height:       Supplemental O2: Room Air SpO2: 93 %  Filed Weights   01/18/21 0600 01/19/21 0600 01/19/21 0856  Weight: 99.2 kg 100.7 kg 98.9 kg     Intake/Output Summary (Last 24 hours) at 01/20/2021 0612 Last data filed at 01/19/2021 2200 Gross per 24 hour  Intake 1159.67 ml  Output --  Net 1159.67 ml   Net IO Since Admission: 665.61 mL [01/20/21 0612]  Physical Exam:  General: No acute distress. Head: Normocephalic. Atraumatic. Neck: R tunneled cath in place CV: RRR. No murmurs, rubs, or gallops. No LE edema Pulmonary: Lungs CTAB. Normal effort. No wheezing or rales. Abdominal: Soft, nontender, nondistended. Normal bowel sounds. Extremities: Palpable radial pulses. S/p bilateral BKA. LUE AV graft  Skin: Cool and dry. Edematous LUE surrounding AV graft, no erythema or signs of compartment syndrome.  Neuro: A&Ox3. No focal deficit. Psych: Normal mood and affect    ASSESSMENT/PLAN:  Assessment: Active Problems:    Goals of care, counseling/discussion   Acute on chronic congestive heart failure (HCC)   Ventricular tachycardia, non-sustained   ESRD needing dialysis [facility])   Plan: #ESRD on HD Patient with CKD V that has progressed to ESRD on HD. Patient is s/p left AV graft placement on 11/14 for long term dialysis access. Left hand still has good circulation and is not painful, although it is edematous today. Will require outpatient follow up with vascular to see if swelling resolves. If it does not resolve, once the graft can be cannulated, vascular with consider a fistulogram.  Patient is s/p 4 HD sessions this admission. He is set up for outpatient HD at [facility], however, patient may go to [facility], which could affect HD plans.  - Nephrology consulted, appreciate recs - Hold hydralazine in the setting of hypotension with dialysis - Continue to monitor renal function panel - Consider arterial doppler of LUE if edema worsens  #Acute on chronic HFrEF (EF 30-35%) Patient presented initially with volume overload with SOB and lower extremity edema, secondary to HFrEF exacerbation complicated by ESRD. Patient remains euvolemic on exam today after receiving repeated dialysis sessions.  - Continue metoprolol, amio, and imdur - Hold hydralazine as above   #Elevated LFTs #Congestive hepatopathy  LFTs have been down trending, with AST/ALT 294/172 from 1221/1123 5 days ago. Elevated LFTs likely secondary to shock liver vs elevation  from amiodarone therapy.  - Continue to monitor LFTs   #Anemia of chronic disease Hb has been stable at 8.7, no signs of active bleeding. Elevated ferritin is likely secondary to acute illness requiring temporary ICU admission. - Continue to monitor CBC   #Thrombocytopenia Plt dropped >50% 3 days after heparin was initiated. 4T score puts patient at low-intermediate risk of HIT and HIT Ab within normal limits, thus thrombocytopenia is not secondary to HIT.  - Continue to  monitor CBC - Holding heparin for dvt prophylaxis and with HD   #Bilateral BKAs Patient has had decreased mobility 2/2 to weakness, decreased balance, and edema in his lower extremities, which has prevented him from being able to utilize is prostheses. Recommended for CIR for patient to work on transfers to/from wheelchair. With volume status fluctuating, patient will benefit from rehab to learn to perform ADLs with and without his prostheses.   Best Practice: Diet: Renal diet IVF: Fluids: none VTE: Continue to hold Code: Full AB: None Therapy Recs: CIR DISPO: Anticipated discharge to  SNF vs CIR  pending  clinical improvement and placement .  Signature: Buddy Duty, D.O.  Internal Medicine Resident, PGY-1 [facility]  Pager: [phone] 6:12 AM, 01/20/2021   Please contact the on call pager after 5 pm and on weekends at [phone].

## 2021-01-20 NOTE — Progress Notes (Signed)
PT Cancellation Note  Patient Details Name: [name] MRN: [id number] DOB: [birth year]   Cancelled Treatment:    Reason Eval/Treat Not Completed: Patient at procedure or test/unavailable (HD).  Wyona Almas, PT, DPT Acute Rehabilitation Services Pager [phone] Office [phone]    Deno Etienne 01/20/2021, 10:03 AM

## 2021-01-21 DIAGNOSIS — N400 Enlarged prostate without lower urinary tract symptoms: Secondary | ICD-10-CM

## 2021-01-21 DIAGNOSIS — Z992 Dependence on renal dialysis: Secondary | ICD-10-CM | POA: Diagnosis not present

## 2021-01-21 DIAGNOSIS — E1122 Type 2 diabetes mellitus with diabetic chronic kidney disease: Secondary | ICD-10-CM | POA: Diagnosis not present

## 2021-01-21 DIAGNOSIS — R531 Weakness: Secondary | ICD-10-CM

## 2021-01-21 DIAGNOSIS — Z515 Encounter for palliative care: Secondary | ICD-10-CM

## 2021-01-21 DIAGNOSIS — N186 End stage renal disease: Secondary | ICD-10-CM | POA: Diagnosis not present

## 2021-01-21 LAB — CBC
HCT: 26.6 % — ABNORMAL LOW (ref 39.0–52.0)
Hemoglobin: 8.7 g/dL — ABNORMAL LOW (ref 13.0–17.0)
MCH: 29.2 pg (ref 26.0–34.0)
MCHC: 32.7 g/dL (ref 30.0–36.0)
MCV: 89.3 fL (ref 80.0–100.0)
Platelets: 82 10*3/uL — ABNORMAL LOW (ref 150–400)
RBC: 2.98 MIL/uL — ABNORMAL LOW (ref 4.22–5.81)
RDW: 17.4 % — ABNORMAL HIGH (ref 11.5–15.5)
WBC: 10.8 10*3/uL — ABNORMAL HIGH (ref 4.0–10.5)
nRBC: 1.1 % — ABNORMAL HIGH (ref 0.0–0.2)

## 2021-01-21 LAB — GLUCOSE, CAPILLARY
Glucose-Capillary: 249 mg/dL — ABNORMAL HIGH (ref 70–99)
Glucose-Capillary: 241 mg/dL — ABNORMAL HIGH (ref 70–99)
Glucose-Capillary: 245 mg/dL — ABNORMAL HIGH (ref 70–99)
Glucose-Capillary: 180 mg/dL — ABNORMAL HIGH (ref 70–99)

## 2021-01-21 MED ORDER — INSULIN ASPART 100 UNIT/ML IJ SOLN
4.0000 [IU] | Freq: Three times a day (TID) | INTRAMUSCULAR | Status: DC
  Administered 2021-01-21 – 2021-01-25 (×11): 4 [IU] via SUBCUTANEOUS

## 2021-01-21 NOTE — Progress Notes (Signed)
Patient noted to have bleeding around tunneled dialysis catheter and under dressing. Order was placed by this nurse for IV team to assess and change dressing if necessary. Patient made aware and verbalizes understanding.

## 2021-01-21 NOTE — Progress Notes (Addendum)
HD#10 SUBJECTIVE:  Patient Summary: [name] is a 70 y.o. with a pertinent PMH of HTN, T2DM, PAD s/p bilateral BKAs, CAD s/p CABG, HFrEF, and ESRD on HD who presented with bilateral lower extremity swelling and admitted for acute on chronic HFrEF complicated by CKD V progression to ESRD on HD.   Overnight Events: No acute events overnight  Interim History: This is hospital day 10 for [name] who was seen and evaluated at the bedside this morning. He states that he is not having any pain in his left arm. His breathing is fine and he states that his thighs are less swollen.   OBJECTIVE:  Vital Signs: Vitals:   01/20/21 1317 01/20/21 1700 01/20/21 2132 01/21/21 0448  BP: 120/62 116/63 (!) 116/53 (!) 114/54  Pulse: 71 72 72 68  Resp: 18 16 18 18   Temp: 98.1 F (36.7 C) 98.4 F (36.9 C) 98.2 F (36.8 C) 98.1 F (36.7 C)  TempSrc: Oral Oral Oral Oral  SpO2: 99% 98% 98% 100%  Weight:      Height:       Supplemental O2: Room Air SpO2: 100 %  Filed Weights   01/20/21 0500 01/20/21 0855 01/20/21 1230  Weight: 105.8 kg 105 kg 104 kg     Intake/Output Summary (Last 24 hours) at 01/21/2021 0543 Last data filed at 01/21/2021 0200 Gross per 24 hour  Intake 280 ml  Output 1000 ml  Net -720 ml   Net IO Since Admission: -54.39 mL [01/21/21 0543]  Physical Exam: General: No acute distress. Head: Normocephalic. Atraumatic. Neck: R tunneled cath in place CV: RRR. No murmurs, rubs, or gallops. No LE edema Pulmonary: Lungs CTAB. Normal effort. No wheezing or rales. Abdominal: Soft, nontender, nondistended. Normal bowel sounds. Extremities: Palpable radial pulses. S/p bilateral BKA. LUE AV graft edematous.  Skin: Warm and dry. Edematous LUE surrounding AV graft (improved), no erythema or signs of compartment syndrome.   Neuro: A&Ox3. No focal deficit. Psych: Normal mood and affect    ASSESSMENT/PLAN:  Assessment: Active Problems:   Goals of care,  counseling/discussion   Acute on chronic congestive heart failure (HCC)   Ventricular tachycardia, non-sustained   ESRD needing dialysis ([place])   Plan: #ESRD on HD #S/p LUE AV graft placement Patient with CKD V that has progressed to ESRD on HD. Left hand remains edematous after LUE AV graft placement, although it appears to be improving. Will require outpatient follow up with vascular regarding this. Patient is set up for TTS dialysis as an outpatient. Medically stable for discharge, however, CIR placement is still pending an insurance appeal. If unable to appeal decision, will consider SNF vs home with wife/caregiver, [name], who may be able to provide 24/7 support for the patient.  - Nephrology consulted, appreciate recs - Hold hydralazine in the setting of hypotension with dialysis - Continue to monitor renal function panel - Consider arterial doppler of LUE if edema worsens - D/c flomax, as patient no longer produces urine  #Type 2 diabetes Patient has been tolerating more po intake, and CBGs have been >250, despite SSI. Will start mealtime insulin. A1c noted to be 6.8, however, likely falsely low in the setting of ESRD on HD.  - Novolog 4u tid with meals + SSI    #Acute on chronic HFrEF (EF 30-35%) Patient presented initially with volume overload with SOB and lower extremity edema, secondary to HFrEF exacerbation complicated by ESRD. Patient remains euvolemic on exam today and is now able to  wear his prostheses again.  - Continue metoprolol, amio, and imdur - Hold hydralazine as above   #Elevated LFTs #Congestive hepatopathy  LFTs have been down trending, will check every other day.  - Continue to monitor LFTs   #Anemia of chronic disease Secondary to CKD/ESRD. Hb has been stable at 8.7, no signs of active bleeding. - Continue to monitor CBC   #Thrombocytopenia Plt dropped >50% 3 days after heparin was initiated, although HIT Ab was within normal limits. Platelet count remains  stable at 82.  - Continue to monitor CBC - Holding heparin for dvt prophylaxis and with HD  Best Practice: Diet: Renal diet IVF: Fluids: none VTE: Held Code: Full AB: None Therapy Recs: CIR, DME: none DISPO: Anticipated discharge to  CIR  pending  placement .  Signature: [name], D.O.  Internal Medicine Resident, PGY-1 [name]  Pager: [phone] 5:43 AM, 01/21/2021   Please contact the on call pager after 5 pm and on weekends at [phone].

## 2021-01-21 NOTE — Progress Notes (Signed)
There was bleeding and blood clots around on tunneled HD cath. Manually pressure HD cath insertion area for 5 min. It appeared stopped bleed, but small blood oozing around HD cath insertion area. Applied pressure dressing. Informed patient's RN regarding this matter and keep watching the dressing. Advised patient' RN Mardene Celeste that if continue to bleed, get order thrombi-pad and IV team will change HD cath dressing. Night shift nurse made aware of it. HD [employer]

## 2021-01-21 NOTE — Progress Notes (Signed)
Inpatient Rehab Admissions Coordinator:   I do not have insurance auth or a CIR bed for this Pt. Today. I received a denial from insurance and began expedited appeal for insurance auth this morning. I also spoke with pt.'s ex wife and anticipated caregiver, [name], over the phone and she confirmed that she is able to provide 24/7 support.   Clemens Catholic, [place], [place] Admissions Coordinator  [phone] ([place]) [phone] (office)

## 2021-01-21 NOTE — Progress Notes (Signed)
Pt refused to have iv reinserted.

## 2021-01-21 NOTE — Progress Notes (Signed)
Patient ID: [name], male   DOB: January 14, 1951, 70 y.o.   MRN: [id number] S: No complaints and no events overnight.  Tolerated HD well. O:BP (!) 104/53 (BP Location: Right Arm)   Pulse 67   Temp 98.4 F (36.9 C) (Oral)   Resp 18   Ht 5\' 8"  (1.727 m)   Wt 104 kg   SpO2 96%   BMI 34.86 kg/m   Intake/Output Summary (Last 24 hours) at 01/21/2021 1103 Last data filed at 01/21/2021 0458 Gross per 24 hour  Intake 400 ml  Output 1000 ml  Net -600 ml   Intake/Output: I/O last 3 completed shifts: In: 760 [P.O.:760] Out: 1000 [Other:1000]  Intake/Output this shift:  No intake/output data recorded. Weight change: 6.116 kg Gen: NAD CVS: RRR Resp: CTA Abd: +Bs, soft, NT/ND Ext: s/p bilateral BKA's, no edema, LUE AVG +T/B, + 1 edema of LUE.  Recent Labs  Lab 01/15/21 0129 01/16/21 0301 01/17/21 0735 01/18/21 0432 01/19/21 0458 01/20/21 0356  NA 134* 133* 128* 130* 131* 129*  K 4.1 3.7 3.6 3.6 3.7 4.8  CL 98 97* 94* 96* 97* 94*  CO2 23 24 22 22  20* 20*  GLUCOSE 118* 159* 162* 128* 192* 299*  BUN 50* 45* 56* 41* 61* 77*  CREATININE 5.31* 5.15* 6.56* 5.28* 6.70* 8.08*  ALBUMIN 3.1* 2.7* 2.9* 2.7*  --  2.8*  CALCIUM 8.5* 8.0* 8.0* 7.9* 8.2* 8.2*  PHOS 4.0  --  4.2  --   --   --   AST 1,221* 723*  --  755*  --  294*  ALT 1,123* 699*  --  480*  --  172*   Liver Function Tests: Recent Labs  Lab 01/16/21 0301 01/17/21 0735 01/18/21 0432 01/20/21 0356  AST 723*  --  755* 294*  ALT 699*  --  480* 172*  ALKPHOS 98  --  100 115  BILITOT 3.5*  --  3.6* 3.1*  PROT 5.5*  --  5.8* 6.3*  ALBUMIN 2.7* 2.9* 2.7* 2.8*   No results for input(s): LIPASE, AMYLASE in the last 168 hours. No results for input(s): AMMONIA in the last 168 hours. CBC: Recent Labs  Lab 01/16/21 0301 01/17/21 0735 01/18/21 0432 01/20/21 0356 01/21/21 0254  WBC 8.0 8.0 7.9 13.4* 10.8*  HGB 9.5* 8.9* 8.9* 8.7* 8.7*  HCT 29.1* 27.0* 27.3* 27.0* 26.6*  MCV 89.8 90.6 89.2 90.3 89.3  PLT 90* 78* 76*  81* 82*   Cardiac Enzymes: No results for input(s): CKTOTAL, CKMB, CKMBINDEX, TROPONINI in the last 168 hours. CBG: Recent Labs  Lab 01/20/21 0644 01/20/21 1315 01/20/21 1606 01/20/21 2133 01/21/21 0643  GLUCAP 311* 176* 196* 235* 249*    Iron Studies: No results for input(s): IRON, TIBC, TRANSFERRIN, FERRITIN in the last 72 hours. Studies/Results: No results found.  (feeding supplement) PROSource Plus  30 mL Oral BID BM   amiodarone  200 mg Oral BID   aspirin EC  81 mg Oral q morning   atorvastatin  40 mg Oral q morning   Chlorhexidine Gluconate Cloth  6 each Topical Q0600   Chlorhexidine Gluconate Cloth  6 each Topical Q0600   clopidogrel  75 mg Oral Daily   feeding supplement (NEPRO CARB STEADY)  237 mL Oral BID BM   gabapentin  300 mg Oral QHS   insulin aspart  0-15 Units Subcutaneous TID WC   insulin aspart  4 Units Subcutaneous TID WC   isosorbide mononitrate  30 mg Oral Daily  metoprolol succinate  50 mg Oral q morning   multivitamin  1 tablet Oral QHS   pantoprazole  40 mg Oral Q0600   sodium bicarbonate  650 mg Oral BID   tamsulosin  0.4 mg Oral q morning    BMET    Component Value Date/Time   NA 129 (L) 01/20/2021 0356   NA 135 07/31/2019 1433   K 4.8 01/20/2021 0356   CL 94 (L) 01/20/2021 0356   CO2 20 (L) 01/20/2021 0356   GLUCOSE 299 (H) 01/20/2021 0356   BUN 77 (H) 01/20/2021 0356   BUN 35 (H) 07/31/2019 1433   CREATININE 8.08 (H) 01/20/2021 0356   CALCIUM 8.2 (L) 01/20/2021 0356   GFRNONAA 7 (L) 01/20/2021 0356   GFRAA 19 (L) 10/12/2019 1548   CBC    Component Value Date/Time   WBC 10.8 (H) 01/21/2021 0254   RBC 2.98 (L) 01/21/2021 0254   HGB 8.7 (L) 01/21/2021 0254   HGB 9.4 (L) 02/06/2019 1016   HCT 26.6 (L) 01/21/2021 0254   HCT 28.4 (L) 02/06/2019 1016   PLT 82 (L) 01/21/2021 0254   PLT 199 02/06/2019 1016   MCV 89.3 01/21/2021 0254   MCV 86 02/06/2019 1016   MCH 29.2 01/21/2021 0254   MCHC 32.7 01/21/2021 0254   RDW 17.4 (H)  01/21/2021 0254   RDW 18.5 (H) 02/06/2019 1016   LYMPHSABS 1.5 01/11/2021 0851   LYMPHSABS 1.3 01/29/2019 1033   MONOABS 0.7 01/11/2021 0851   EOSABS 0.2 01/11/2021 0851   EOSABS 0.1 01/29/2019 1033   BASOSABS 0.0 01/11/2021 0851   BASOSABS 0.0 01/29/2019 1033    Assessment/Plan:   New ESRD - progression likely exacerbated by cardiorenal syndrome. S/p 3 HD sessions and due for HD tomorrow.  He is currently set up for TTS at [facility] once out of CIR. Chronic systolic CHF - improved volume status with HD and UF. 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 Thrombocytopenia - no heparin Abnormal LFT's- per primary. HTN -stable Vascular access -s /p LUE AVG placement 01/19/21 with some edema but no pain.  Continue to follow for evidence of steal syndrome Disposition - for CIR pending insurance clearance.  Set up for HD at [facility] on TTS schedule and will continue with that schedule while he remains an inpatient.   Donetta Potts, MD [employer] [phone]

## 2021-01-21 NOTE — Progress Notes (Signed)
Contacted by Laura with CIR this am. Laura inquired about out-pt HD arrangements. Provided details. Laura also states that pt's ex-wife is not willing to assist with transportation to/from HD. Met with pt at bedside. Originally, pt did not want to use transportation services and said that he would drive himself. Made pt aware of the fact that ex-wife is unable to assist with transportation if pt cannot drive self. Pt now agreeable to consider [facility] for services if needed. Made TOC staff and Laura aware of this information. Will assist as needed.  Tracy Mounce Renal Navigator [phone] 

## 2021-01-21 NOTE — Progress Notes (Signed)
Occupational Therapy Treatment Patient Details Name: [name] MRN: [id number] DOB: March 27, 1950 Today's Date: 01/21/2021   History of present illness Pt adm 11/6 with acute on chronic HFrEF. Pt developed Vtach and transferred to ICU. Pt with continued worsening of CKD and going to require HD. L AVG placed 11/14. PMH: HTN, T2DM, previous CVA, s/p CABG x 4, HLD, CKD IV, HFrEF (last EF 35-40% in 2020), and bilateral BKAs   OT comments  Patient received in bed and agreeable to OT session. Patient was min assist to get to eob and increased time before attempting slide board transfer. Patient was max assist for slide board transfer into recliner due to posterior leaning.  Once in recliner patient performed grooming and donned LE prosthesis. Inserts were removed to allow for fit.  Patient required increased time to rest before attempting standing from recliner to RW. Patient was max assist to power up and mod assist for balance for static standing.  Recommended to limit activity with prosthesis due to absents of inserts and recommended list to return to bed with nursing. Patient is making good progress with OT treatment with acute inpatient rehab recommended.    Recommendations for follow up therapy are one component of a multi-disciplinary discharge planning process, led by the attending physician.  Recommendations may be updated based on patient status, additional functional criteria and insurance authorization.    Follow Up Recommendations  Acute inpatient rehab (3hours/day)    Assistance Recommended at Discharge Intermittent Supervision/Assistance  Equipment Recommendations  BSC/3in1;Wheelchair (measurements OT);Wheelchair cushion (measurements OT)    Recommendations for Other Services      Precautions / Restrictions Precautions Precautions: Fall;Other (comment) Precaution Comments: bilateral BKA Required Braces or Orthoses: Other Brace Other Brace: Bilateral prostheses, unable to use due  to swelling Restrictions Weight Bearing Restrictions: No       Mobility Bed Mobility Overal bed mobility: Needs Assistance Bed Mobility: Supine to Sit     Supine to sit: Min assist;HOB elevated     General bed mobility comments: pad used to assist to get patient on eob    Transfers Overall transfer level: Needs assistance Equipment used: Sliding board Transfers: Bed to chair/wheelchair/BSC             General transfer comment: max assist for slide board transfer into recliner due to posterior leaning     Balance Overall balance assessment: Needs assistance Sitting-balance support: Bilateral upper extremity supported;Feet unsupported Sitting balance-Leahy Scale: Poor Sitting balance - Comments: posterior leaning Postural control: Posterior lean Standing balance support: Bilateral upper extremity supported Standing balance-Leahy Scale: Poor Standing balance comment: stood from relcliner with max assist to power up and mod assist for balance                           ADL either performed or assessed with clinical judgement   ADL Overall ADL's : Needs assistance/impaired     Grooming: Wash/dry hands;Sitting;Set up Grooming Details (indicate cue type and reason): performed in recliner                               General ADL Comments: only agreeable to grooming this am    Extremity/Trunk Assessment Upper Extremity Assessment RUE Coordination: decreased fine motor LUE Coordination: decreased fine motor            Vision       Perception     Praxis  Cognition Arousal/Alertness: Awake/alert Behavior During Therapy: Flat affect Overall Cognitive Status: Impaired/Different from baseline Area of Impairment: Safety/judgement;Awareness;Problem solving                         Safety/Judgement: Decreased awareness of deficits;Decreased awareness of safety Awareness: Intellectual Problem Solving: Slow  processing;Decreased initiation;Difficulty sequencing;Requires verbal cues General Comments: patient oriented x4 and able to follow directions          Exercises     Shoulder Instructions       General Comments      Pertinent Vitals/ Pain       Pain Assessment: Faces Faces Pain Scale: Hurts a little bit Pain Location: back Pain Descriptors / Indicators: Aching;Grimacing Pain Intervention(s): Repositioned  Home Living                                          Prior Functioning/Environment              Frequency  Min 2X/week        Progress Toward Goals  OT Goals(current goals can now be found in the care plan section)  Progress towards OT goals: Progressing toward goals  Acute Rehab OT Goals OT Goal Formulation: With patient Time For Goal Achievement: 01/30/21 Potential to Achieve Goals: Good ADL Goals Pt Will Perform Grooming: with set-up;sitting Pt Will Perform Lower Body Bathing: with set-up;sitting/lateral leans Pt Will Perform Lower Body Dressing: with set-up;sitting/lateral leans Pt Will Transfer to Toilet: with transfer board;bedside commode;with modified independence Pt Will Perform Toileting - Clothing Manipulation and hygiene: with modified independence;sitting/lateral leans  Plan Discharge plan remains appropriate    Co-evaluation                 AM-PAC OT "6 Clicks" Daily Activity     Outcome Measure   Help from another person eating meals?: None Help from another person taking care of personal grooming?: A Little Help from another person toileting, which includes using toliet, bedpan, or urinal?: A Lot Help from another person bathing (including washing, rinsing, drying)?: A Lot Help from another person to put on and taking off regular upper body clothing?: A Little Help from another person to put on and taking off regular lower body clothing?: A Lot 6 Click Score: 16    End of Session Equipment Utilized During  Treatment: Gait belt;Rolling walker (2 wheels);Other (comment) (slide board)  OT Visit Diagnosis: Muscle weakness (generalized) (M62.81);Other symptoms and signs involving cognitive function   Activity Tolerance Patient tolerated treatment well   Patient Left in chair;with call bell/phone within reach;with chair alarm set   Nurse Communication Mobility status;Need for lift equipment        Time: [phone] OT Time Calculation (min): 44 min  Charges: OT General Charges $OT Visit: 1 Visit OT Treatments $Self Care/Home Management : 8-22 mins $Therapeutic Activity: 23-37 mins  Lodema Hong, OTA Acute Rehabilitation Services  Pager [phone] Office [place] 01/21/2021, 8:46 AM

## 2021-01-21 NOTE — Progress Notes (Signed)
Patient ID: [name], male   DOB: 1950/12/16, 70 y.o.   MRN: [id number]    Progress Note from the Palliative Medicine Team at [facility]   Patient Name: [name]        Date: 01/21/2021 DOB: 03/22/1950  Age: 70 y.o. MRN#: [id number] Attending Physician: Velna Ochs, MD Primary Care Physician: [name], MD Admit Date: 01/11/2021   Medical records reviewed   70 y.o. male  with past medical history of HTN, T2DM, previous CVA, s/p CABG x 4, HLD, CKD IV, HFrEF (last EF 35-40% in 2020), and bilateral BKAs admitted on 01/11/2021 with worsening bilateral lower extremity edema in his thighs and shortness of breath that started about 1 week ago.    Patient has AKI on CKD4 with history of refusing HD in many discussions.   PMT has been consulted to assist with goals of care conversation, initial consult 01-13-21.  Patient now has tunneled dialysis catheter for access and has moved forward with ongoing dialysis treatments.   Hopeful for short term rehabilitation.   Today's discussion  I spoke with patient at the bedside and then by telephone with main support person/Edna Sebek/ex-wife.  Education offered and conversation had regarding patient's multiple co-morbidities and high risk for decompensation and the importance of continued conversation regarding plan of care, advanced directives and anticipatory care needs.  Education offered on the importance of personal responsibility in overall health and wellness plan.  Patient is open to and hopeful for CIR for ongoing rehabilitation opportunity.    Patient was active with [facility] palliative care services prior to admission.   Questions and concerns addressed   Discussed with CIR/liason  Total time spent on the unit was 35 minutes  Greater than 50% of the time was spent in counseling and coordination of care  Wadie Lessen NP  Palliative Medicine Team Team Phone # [phone] Pager [phone]

## 2021-01-22 ENCOUNTER — Inpatient Hospital Stay (HOSPITAL_COMMUNITY): Payer: Medicare Other

## 2021-01-22 ENCOUNTER — Encounter (HOSPITAL_COMMUNITY): Payer: Self-pay

## 2021-01-22 ENCOUNTER — Other Ambulatory Visit (HOSPITAL_COMMUNITY): Payer: Self-pay

## 2021-01-22 DIAGNOSIS — R569 Unspecified convulsions: Secondary | ICD-10-CM

## 2021-01-22 LAB — CBC
HCT: 25.9 % — ABNORMAL LOW (ref 39.0–52.0)
Hemoglobin: 8.2 g/dL — ABNORMAL LOW (ref 13.0–17.0)
MCH: 29.2 pg (ref 26.0–34.0)
MCHC: 31.7 g/dL (ref 30.0–36.0)
MCV: 92.2 fL (ref 80.0–100.0)
Platelets: 97 10*3/uL — ABNORMAL LOW (ref 150–400)
RBC: 2.81 MIL/uL — ABNORMAL LOW (ref 4.22–5.81)
RDW: 17.4 % — ABNORMAL HIGH (ref 11.5–15.5)
WBC: 9.2 10*3/uL (ref 4.0–10.5)
nRBC: 1.4 % — ABNORMAL HIGH (ref 0.0–0.2)

## 2021-01-22 LAB — CBC WITH DIFFERENTIAL/PLATELET
Abs Immature Granulocytes: 0.09 10*3/uL — ABNORMAL HIGH (ref 0.00–0.07)
Basophils Absolute: 0 10*3/uL (ref 0.0–0.1)
Basophils Relative: 0 %
Eosinophils Absolute: 0.2 10*3/uL (ref 0.0–0.5)
Eosinophils Relative: 2 %
HCT: 26 % — ABNORMAL LOW (ref 39.0–52.0)
Hemoglobin: 8.6 g/dL — ABNORMAL LOW (ref 13.0–17.0)
Immature Granulocytes: 1 %
Lymphocytes Relative: 19 %
Lymphs Abs: 2 10*3/uL (ref 0.7–4.0)
MCH: 30.3 pg (ref 26.0–34.0)
MCHC: 33.1 g/dL (ref 30.0–36.0)
MCV: 91.5 fL (ref 80.0–100.0)
Monocytes Absolute: 1.8 10*3/uL — ABNORMAL HIGH (ref 0.1–1.0)
Monocytes Relative: 17 %
Neutro Abs: 6.5 10*3/uL (ref 1.7–7.7)
Neutrophils Relative %: 61 %
Platelets: 107 10*3/uL — ABNORMAL LOW (ref 150–400)
RBC: 2.84 MIL/uL — ABNORMAL LOW (ref 4.22–5.81)
RDW: 17.7 % — ABNORMAL HIGH (ref 11.5–15.5)
Smear Review: NORMAL
WBC: 10.6 10*3/uL — ABNORMAL HIGH (ref 4.0–10.5)
nRBC: 1.3 % — ABNORMAL HIGH (ref 0.0–0.2)

## 2021-01-22 LAB — COMPREHENSIVE METABOLIC PANEL
ALT: 32 U/L (ref 0–44)
AST: 103 U/L — ABNORMAL HIGH (ref 15–41)
Albumin: 2.5 g/dL — ABNORMAL LOW (ref 3.5–5.0)
Alkaline Phosphatase: 117 U/L (ref 38–126)
Anion gap: 12 (ref 5–15)
BUN: 64 mg/dL — ABNORMAL HIGH (ref 8–23)
CO2: 23 mmol/L (ref 22–32)
Calcium: 8.1 mg/dL — ABNORMAL LOW (ref 8.9–10.3)
Chloride: 97 mmol/L — ABNORMAL LOW (ref 98–111)
Creatinine, Ser: 7.08 mg/dL — ABNORMAL HIGH (ref 0.61–1.24)
GFR, Estimated: 8 mL/min — ABNORMAL LOW (ref >60)
Glucose, Bld: 163 mg/dL — ABNORMAL HIGH (ref 70–99)
Potassium: 4.2 mmol/L (ref 3.5–5.1)
Sodium: 132 mmol/L — ABNORMAL LOW (ref 135–145)
Total Bilirubin: 2 mg/dL — ABNORMAL HIGH (ref 0.3–1.2)
Total Protein: 5.8 g/dL — ABNORMAL LOW (ref 6.5–8.1)

## 2021-01-22 LAB — BASIC METABOLIC PANEL
Anion gap: 14 (ref 5–15)
BUN: 34 mg/dL — ABNORMAL HIGH (ref 8–23)
CO2: 23 mmol/L (ref 22–32)
Calcium: 8.1 mg/dL — ABNORMAL LOW (ref 8.9–10.3)
Chloride: 95 mmol/L — ABNORMAL LOW (ref 98–111)
Creatinine, Ser: 4.56 mg/dL — ABNORMAL HIGH (ref 0.61–1.24)
GFR, Estimated: 13 mL/min — ABNORMAL LOW (ref >60)
Glucose, Bld: 153 mg/dL — ABNORMAL HIGH (ref 70–99)
Potassium: 3.8 mmol/L (ref 3.5–5.1)
Sodium: 132 mmol/L — ABNORMAL LOW (ref 135–145)

## 2021-01-22 LAB — POCT I-STAT 7, (LYTES, BLD GAS, ICA,H+H)
Acid-Base Excess: 0 mmol/L (ref 0.0–2.0)
Bicarbonate: 26.3 mmol/L (ref 20.0–28.0)
Calcium, Ion: 1.05 mmol/L — ABNORMAL LOW (ref 1.15–1.40)
HCT: 32 % — ABNORMAL LOW (ref 39.0–52.0)
Hemoglobin: 10.9 g/dL — ABNORMAL LOW (ref 13.0–17.0)
O2 Saturation: 39 %
Potassium: 3.5 mmol/L (ref 3.5–5.1)
Sodium: 134 mmol/L — ABNORMAL LOW (ref 135–145)
TCO2: 28 mmol/L (ref 22–32)
pCO2 arterial: 48.2 mmHg — ABNORMAL HIGH (ref 32.0–48.0)
pH, Arterial: 7.345 — ABNORMAL LOW (ref 7.350–7.450)
pO2, Arterial: 24 mmHg — CL (ref 83.0–108.0)

## 2021-01-22 LAB — GLUCOSE, CAPILLARY
Glucose-Capillary: 200 mg/dL — ABNORMAL HIGH (ref 70–99)
Glucose-Capillary: 146 mg/dL — ABNORMAL HIGH (ref 70–99)
Glucose-Capillary: 171 mg/dL — ABNORMAL HIGH (ref 70–99)
Glucose-Capillary: 133 mg/dL — ABNORMAL HIGH (ref 70–99)
Glucose-Capillary: 98 mg/dL (ref 70–99)

## 2021-01-22 LAB — TROPONIN I (HIGH SENSITIVITY)
Troponin I (High Sensitivity): 270 ng/L (ref <18)
Troponin I (High Sensitivity): 295 ng/L (ref <18)

## 2021-01-22 LAB — MAGNESIUM: Magnesium: 2.2 mg/dL (ref 1.7–2.4)

## 2021-01-22 MED ORDER — ONDANSETRON HCL 4 MG/2ML IJ SOLN
INTRAMUSCULAR | Status: AC
  Filled 2021-01-22: qty 2

## 2021-01-22 MED ORDER — DARBEPOETIN ALFA 60 MCG/0.3ML IJ SOSY
60.0000 ug | PREFILLED_SYRINGE | INTRAMUSCULAR | Status: DC
  Administered 2021-01-22: 60 ug via INTRAVENOUS
  Filled 2021-01-22 (×2): qty 0.3

## 2021-01-22 MED ORDER — HEPARIN SODIUM (PORCINE) 1000 UNIT/ML IJ SOLN
1.6000 mL | Freq: Once | INTRAMUSCULAR | Status: DC
  Filled 2021-01-22: qty 1.6

## 2021-01-22 MED ORDER — ONDANSETRON HCL 4 MG/2ML IJ SOLN
4.0000 mg | Freq: Three times a day (TID) | INTRAMUSCULAR | Status: DC | PRN
  Administered 2021-01-22: 4 mg via INTRAVENOUS

## 2021-01-22 MED ORDER — SODIUM CHLORIDE 0.9 % IV SOLN: INTRAVENOUS | Status: DC

## 2021-01-22 MED ORDER — "THROMBI-PAD 3""X3"" EX PADS"
1.0000 | MEDICATED_PAD | Freq: Once | CUTANEOUS | Status: AC
  Administered 2021-01-22: 1 via TOPICAL
  Filled 2021-01-22 (×2): qty 1

## 2021-01-22 MED ORDER — FUROSEMIDE 80 MG PO TABS
80.0000 mg | ORAL_TABLET | Freq: Two times a day (BID) | ORAL | 0 refills | Status: DC
  Filled 2021-01-22: qty 60, 30d supply, fill #0

## 2021-01-22 MED ORDER — ALBUMIN HUMAN 25 % IV SOLN
25.0000 g | Freq: Once | INTRAVENOUS | Status: AC
  Administered 2021-01-22: 25 g via INTRAVENOUS

## 2021-01-22 MED ORDER — HEPARIN SODIUM (PORCINE) 1000 UNIT/ML IJ SOLN
INTRAMUSCULAR | Status: AC
  Filled 2021-01-22: qty 1

## 2021-01-22 MED ORDER — RYBELSUS 3 MG PO TABS
3.0000 mg | ORAL_TABLET | Freq: Every day | ORAL | 0 refills | Status: DC
  Filled 2021-01-22: qty 30, 30d supply, fill #0

## 2021-01-22 MED ORDER — ISOSORBIDE MONONITRATE ER 30 MG PO TB24
30.0000 mg | ORAL_TABLET | Freq: Every day | ORAL | 0 refills | Status: DC
  Filled 2021-01-22: qty 30, 30d supply, fill #0

## 2021-01-22 MED ORDER — AMIODARONE HCL 200 MG PO TABS
200.0000 mg | ORAL_TABLET | Freq: Two times a day (BID) | ORAL | 0 refills | Status: DC
  Filled 2021-01-22: qty 60, 30d supply, fill #0

## 2021-01-22 MED ORDER — ALBUMIN HUMAN 25 % IV SOLN
INTRAVENOUS | Status: AC
  Filled 2021-01-22: qty 100

## 2021-01-22 NOTE — Plan of Care (Signed)
HD today with 2L of fluid off. Pt's bp was soft and he was bradycardic after treatment; therefore Imdur and Toprol held per MD verbal order.   Pt stated earlier he wanted to be discharged home. Later when son visited he informed this RN that pt will not have assistance at home and that he would be more comfortable if Mr. Engelson would go to rehab.  MD at bedside with Case Manager and son to discuss options.   Problem: Education: Goal: Knowledge of General Education information will improve Description: Including pain rating scale, medication(s)/side effects and non-pharmacologic comfort measures Outcome: Progressing   Problem: Clinical Measurements: Goal: Ability to maintain clinical measurements within normal limits will improve Outcome: Progressing   Problem: Clinical Measurements: Goal: Will remain free from infection Outcome: Progressing

## 2021-01-22 NOTE — Discharge Summary (Addendum)
Name: [name] MRN: [id number] DOB: 09-27-1950 70 y.o. PCP: [name], MD  Date of Admission: 01/11/2021  8:42 AM Date of Discharge: 01/25/21  Attending Physician: Dr. Daryll Drown  DISCHARGE DIAGNOSIS:  Primary Problem: ESRD needing dialysis [facility])   Discharge diagnosis Acute on chronic heart failure exacerbation ESRD requiring dialysis Sustained V. Tach Acute CVA Possible seizure Thrombocytopenia Type 2 diabetes    DISCHARGE MEDICATIONS:   Allergies as of 01/25/2021   No Known Allergies      Medication List     STOP taking these medications    furosemide 80 MG tablet Commonly known as: LASIX   hydrALAZINE 25 MG tablet Commonly known as: APRESOLINE   isosorbide mononitrate 30 MG 24 hr tablet Commonly known as: IMDUR   metoprolol succinate 50 MG 24 hr tablet Commonly known as: TOPROL-XL   sodium zirconium cyclosilicate 10 g Pack packet Commonly known as: LOKELMA   tamsulosin 0.4 MG Caps capsule Commonly known as: FLOMAX       TAKE these medications    amiodarone 200 MG tablet Commonly known as: PACERONE Take 1 tablet (200 mg total) by mouth daily. Hold if HR < 55   aspirin 81 MG EC tablet Take 1 tablet (81 mg total) by mouth every morning for 21 days. Swallow whole. What changed: additional instructions   atorvastatin 80 MG tablet Commonly known as: LIPITOR Take 1 tablet (80 mg total) by mouth at bedtime. What changed:  medication strength how much to take when to take this   clopidogrel 75 MG tablet Commonly known as: PLAVIX Take 1 tablet (75 mg total) by mouth daily.   gabapentin 300 MG capsule Commonly known as: NEURONTIN Take 1 capsule (300 mg total) by mouth at bedtime.   nitroGLYCERIN 0.4 MG SL tablet Commonly known as: NITROSTAT Place 0.4 mg under the tongue every 5 (five) minutes as needed for chest pain.   pantoprazole 40 MG tablet Commonly known as: PROTONIX Take 1 tablet (40 mg total) by mouth daily at 6 (six)  AM.   Rybelsus 3 MG Tabs Generic drug: Semaglutide Take 1 tablet (3 mg) by mouth daily.   sodium bicarbonate 650 MG tablet Take 650 mg by mouth 2 (two) times daily.   VITAMIN B-12 PO Take 1 tablet by mouth daily.               Durable Medical Equipment  (From admission, onward)           Start     Ordered   01/22/21 1658  For home use only DME Bedside commode  Once       Question:  Patient needs a bedside commode to treat with the following condition  Answer:  S/P bilateral BKA (below knee amputation) ([place])   01/22/21 1658   01/22/21 1658  For home use only DME standard manual wheelchair with seat cushion  Once       Comments: Patient suffers from bilateral below the knee amputations which impairs their ability to perform daily activities like bathing, dressing, feeding, grooming, and toileting in the home.  A cane, crutch, or walker will not resolve issue with performing activities of daily living. A wheelchair will allow patient to safely perform daily activities. Patient can safely propel the wheelchair in the home or has a caregiver who can provide assistance. Length of need Lifetime. Accessories: elevating leg rests (ELRs), wheel locks, extensions and anti-tippers.   01/22/21 1658   01/22/21 1657  For home use only  DME Other see comment  Once       Comments: Hoyer lift, sliding board, and wheelchair with cushion  Question:  Length of Need  Answer:  Lifetime   01/22/21 1658   01/22/21 0000  For home use only DME Other see comment       Comments: Sliding board  Question:  Length of Need  Answer:  12 Months   01/22/21 1334              Discharge Care Instructions  (From admission, onward)           Start     Ordered   01/22/21 0000  Leave dressing on - Keep it clean, dry, and intact until clinic visit        01/22/21 1334            DISPOSITION AND FOLLOW-UP:  [name] was discharged from [facility] in Stable condition.  At the hospital follow up visit please address:  ESRD -His Lokelma was stopped.  Please assess if patient will need Lokelma after discharge.  He has not required any Lokelma during this admission. -Outpatient HD with [facility] TTS after CIR  CHF Sustained V-tach -Metoprolol, Imdur, hydralazine were held due to hypotension with HD -New medication: Amiodarone 200 mg daily for sustained V. Tach -Holding Lasix because patient does not make urine. -Follow-up with Dr. Terrence Dupont  CVA -Aspirin/Plavix for 3 weeks then aspirin alone. -Follow-up with neurology outpatient -He will need to follow-up with cardiology for cardiac monitor  Thrombocytopenia Platelets dropped > 50% on 11/9, 3-days after starting heparin.  Heparin was held.  HIT antibody came back normal.  Platelet normalized on discharge. -Repeat CBC at follow-up  Elevated transaminase enzyme Due to hepatic congestion -Repeat LFT at follow-up  Type 2 diabetes -Start Rybelsus.  Titrate up if needed  Leukocytosis  Patient has had intermittent leukocytosis during hospitalization.  Persistent leukocytosis since 11/17.  No signs of infection.  He has been afebrile.  This could be secondary to epo injection. -Repeat CBC on follow-up  Follow-up Recommendations: Consults: Nephrology, cardiology Labs:  Renal function panel, CBC , hepatic function panel Studies: Consider fistulogram for left arm AV graft  Follow-up Appointments:  [facility] up.   Why: Schedule is Tuesday, Thursday, Saturday Arrival time of 11:10 for 11:30 chair time. For first appointment, pt needs to arrive at 10:30 to complete paperwork. Contact information: [place] [address] [phone]         Angelia Mould, MD Follow up.   Specialties: Vascular Surgery, Cardiology Why: office will call you to arrange your appt (sent) Contact information: [place]  [id number] [phone]         [name], MD Follow up.   Specialty: Internal Medicine Why: Clinic will call you with an appt Contact information: [place] [address] [phone]         [facility]. Schedule an appointment as soon as possible for a visit in 1 month(s).   Specialty: Neurology Why: stroke clinic Contact information: [place]:  Patient Summary:  Acute on chronic HFrEF Sustained V-tach Patient presented with 1 week of shortness of breath and bilateral lower extremity edema. Last echo in 2020 with EF of  35-40%, mild LVH, global LV hypokinesis without regional wall motion abnormalities, and indeterminate diastolic dysfunction. BNP elevated >4,500. HF exacerbation likely due to worsening CKD and decreased urine output. He had minimal urine output with IV Lasix 80 mg. He was later started on HD for volume removal.   On hospital day 1, patient had EKG with wide complex tachycardia, concerning for sustained vtach likely in the setting of electrolyte abnormalities and metabolic acidosis. He was started on Amiodarone push/infusion and transferred to the ICU. IMTS resumed care of this patient on 11/11. He was transitioned to amiodarone po. Dosage was decrease to 200 mg daily given hypotension. His home blood pressure medications including hydralazine, metoprolol and imdur were held due to hypotension.   ESRD on HD Patient notes marked decrease of urine output on admission. He last saw his nephrologist in March of 2022, and noted that he adamantly does not want to be put on dialysis. He has also been following regularly with palliative care. During his course in ICU, patient agreed to start dialysis and had AV graft placed. Patient has been accepted to [facility] on TTS schedule.   #CVA  #Possible  seizure Patient had a short code blue called on him on hospital day 11 (11/17) as he vomited and became unresponsive, although he never lost a pulse. Possibly had seizure like activity, and was incontinent of his stool. CPR was not performed, and the patient become responsive after about 3 minutes. His respiratory arrest was thought to be secondary to seizure like activity. MRI brain showed a small acute infarct in his left pons and a focus of acute ischemia in the right corona radiata, with multiple old infarcts noted, as well. Patient has not been noted to be in Afib, however, we are unable to see telemetry from yesterday during the episode. 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 MRA with no large vessel occlusion, although motion degraded regarding evaluation of intracranial ICA.no seizure captured on EEG.  Neurology was consulted and recommended outpatient follow up with Cardiology for cardiac monitor. He was started on DAPT and atorvastatin.   #Diabetes A1c is likely falsely low in the setting of ESRD on HD. Patient did not require much insulin during his hospitalization. Will start patient on Rybelsus (semaglutide) 3 mg daily x 1 month. Likely will need this increased, and will need further titration of his diabetes regimen with his PCP.      DISCHARGE INSTRUCTIONS:   Discharge Instructions     (HEART FAILURE PATIENTS) Call MD:  Anytime you have any of the following symptoms: 1) 3 pound weight gain in 24 hours or 5 pounds in 1 week 2) shortness of breath, with or without a dry hacking cough 3) swelling in the hands, feet or stomach 4) if you have to sleep on extra pillows at night in order to breathe.   Complete by: As directed    Ambulatory referral to Neurology   Complete by: As directed    Follow up with stroke clinic NP (Jessica Vanschaick or Cecille Rubin, if both not  available, consider Zachery Dauer, or Ahern) at [facility] in about 4 weeks. Thanks.   Call MD for:  difficulty breathing, headache or visual disturbances   Complete by: As directed    Call MD for:  persistant dizziness or light-headedness   Complete by: As directed    Diet - low sodium heart  healthy   Complete by: As directed    Diet - low sodium heart healthy   Complete by: As directed    Discharge instructions   Complete by: As directed    [name],  It was a pleasure taking care of you during this admission.  You were hospitalized for shortness of breath and swelling due to heart failure exacerbation in the setting of worsening kidney disease.  You ultimately require dialysis for volume removal.  You will continue with outpatient dialysis Tuesday Thursday and Saturday.  Early on this admission, your heart rate was very high which require admission to the ICU.  You were started on amiodarone to control your heart rate.  Please take 200 mg of amiodarone daily and follow-up with Dr. Terrence Dupont after discharge.  You were also found to have a small stroke of the left pons.  Please start taking aspirin and Plavix for 3 weeks then aspirin alone.  We increased your Lipitor to 80 mg.  You will follow-up with Dr. Terrence Dupont outpatient to have a cardiac monitor.  We stopped your blood pressure medication: Metoprolol, hydralazine and Imdur due to low blood pressure with dialysis.  You will be transferred to inpatient rehab for more physical therapy.  Please also go to the internal medicine center for a follow up with your primary care physician.  If you have any questions or concerns, call our clinic at [phone] or after hours call [phone] and ask for the internal medicine resident on call.  Take care,  Dr. Alfonse Spruce   For home use only DME Other see comment   Complete by: As directed    Sliding board   Length of Need: 12 Months   Increase activity slowly   Complete by: As directed     Increase activity slowly   Complete by: As directed    Leave dressing on - Keep it clean, dry, and intact until clinic visit   Complete by: As directed    No wound care   Complete by: As directed        SUBJECTIVE:  [name] was seen and evaluated on the day of discharge.  He appears comfortable in no acute distress.  Denies any complaints.  He reports doing well and eager to be discharged.  Patient agrees with inpatient rehab   Discharge Vitals:   BP (!) 110/57 (BP Location: Right Arm)   Pulse (!) 59   Temp 98.3 F (36.8 C) (Oral)   Resp 15   Ht 5\' 8"  (1.727 m)   Wt 104.5 kg   SpO2 100%   BMI 35.03 kg/m   OBJECTIVE:  General: No acute distress. Neck: R tunneled cath remains in place CV: RRR. No murmurs, rubs, or gallops Pulmonary: Lungs CTAB. Normal effort. Abdominal: Soft, nontender, nondistended.  Extremities: Warm to palpation  Skin: Warm and dry.  Neuro: A&Ox3. No focal deficit. Psych: Normal mood and affect   Pertinent Labs, Studies, and Procedures:  CBC Latest Ref Rng & Units 01/25/2021 01/24/2021 01/23/2021  WBC 4.0 - 10.5 K/uL 12.5(H) 12.5(H) 11.5(H)  Hemoglobin 13.0 - 17.0 g/dL 7.6(L) 8.0(L) 8.7(L)  Hematocrit 39.0 - 52.0 % 24.0(L) 25.4(L) 27.1(L)  Platelets 150 - 400 K/uL 165 155 108(L)    CMP Latest Ref Rng & Units 01/25/2021 01/24/2021 01/23/2021  Glucose 70 - 99 mg/dL 168(H) 232(H) 87  BUN 8 - 23 mg/dL 31(H) 53(H) 39(H)  Creatinine 0.61 - 1.24 mg/dL 4.56(H) 6.39(H) 4.96(H)  Sodium 135 - 145 mmol/L 133(L)  130(L) 134(L)  Potassium 3.5 - 5.1 mmol/L 3.8 4.4 4.1  Chloride 98 - 111 mmol/L 96(L) 94(L) 95(L)  CO2 22 - 32 mmol/L 26 24 23   Calcium 8.9 - 10.3 mg/dL 8.3(L) 8.1(L) 8.3(L)  Total Protein 6.5 - 8.1 g/dL - 5.6(L) -  Total Bilirubin 0.3 - 1.2 mg/dL - 2.3(H) -  Alkaline Phos 38 - 126 U/L - 82 -  AST 15 - 41 U/L - 70(H) -  ALT 0 - 44 U/L - 20 -   DG Chest 2 View  Result Date: 01/11/2021 CLINICAL DATA:  Worsening thigh swelling. EXAM: CHEST  - 2 VIEW COMPARISON:  January 30, 2019 FINDINGS: Postsurgical changes from CABG. Enlarged cardiac silhouette. Calcific atherosclerotic disease and tortuosity of the aorta. Streaky bilateral pulmonary opacities may represent scarring given their stability from prior radiographs. No definite pulmonary edema. Osseous structures are without acute abnormality. Soft tissues are grossly normal. IMPRESSION: 1. Streaky bilateral pulmonary opacities may represent scarring given their stability from prior radiographs. 2. Enlarged cardiac silhouette. Electronically Signed   By: Fidela Salisbury M.D.   On: 01/11/2021 09:44   US RENAL  Result Date: 01/11/2021 CLINICAL DATA:  Acute renal insufficiency EXAM: RENAL / URINARY TRACT ULTRASOUND COMPLETE COMPARISON:  August 20, 2018 FINDINGS: Right Kidney: Renal measurements: 9.4 x 4.3 x 5.9 cm = volume: 124 mL. Mildly increased echogenicity. No mass or hydronephrosis visualized. Left Kidney: Renal measurements: 8.8 x 4.3 x 3.8 cm = volume: 74 mL. Mildly increased echogenicity. No mass or hydronephrosis visualized. Bladder: Appears normal for degree of bladder distention. Other: Heterogeneous appearance of the prostate gland and questionable enlargement. IMPRESSION: Bilateral increased echogenicity of the kidneys, consistent with medical renal disease. Abnormal appearance of the prostate gland. Please correlate to PSA values. Electronically Signed   By: Fidela Salisbury M.D.   On: 01/11/2021 15:31   ECHOCARDIOGRAM COMPLETE  Result Date: 01/12/2021    ECHOCARDIOGRAM REPORT   Patient Name:   [name] Date of Exam: 01/11/2021 Medical Rec #:  [id number]      Height:       68.0 in Accession #:    [id number]     Weight:       229.1 lb Date of Birth:  1950/11/12       BSA:          2.165 m Patient Age:    33 years       BP:           157/85 mmHg Patient Gender: M              HR:           86 bpm. Exam Location:  Inpatient Procedure: 2D Echo Indications:     congestive heart  failure  History:         Patient has prior history of Echocardiogram examinations, most                  recent 08/19/2018. Prior CABG, chronic kidney disease; Risk                  Factors:Hypertension and Diabetes.  Sonographer:     Johny Chess RDCS Referring Phys:  [place] Diagnosing Phys: Charolette Forward MD IMPRESSIONS  1. Left ventricular ejection fraction, by estimation, is 30 to 35%. The left ventricle has moderate to severely decreased function. The left ventricle demonstrates global hypokinesis. The left ventricular internal cavity size was mildly dilated. Left ventricular diastolic function could not  be evaluated.  2. Right ventricular systolic function is mildly reduced. The right ventricular size is normal. There is moderately elevated pulmonary artery systolic pressure.  3. Left atrial size was mildly dilated.  4. The mitral valve is normal in structure. Moderate mitral valve regurgitation.  5. Tricuspid valve regurgitation is mild to moderate.  6. The aortic valve is normal in structure. Aortic valve regurgitation is not visualized. Mild aortic valve sclerosis is present, with no evidence of aortic valve stenosis.  7. The inferior vena cava is dilated in size with <50% respiratory variability, suggesting right atrial pressure of 15 mmHg. FINDINGS  Left Ventricle: Left ventricular ejection fraction, by estimation, is 30 to 35%. The left ventricle has moderate to severely decreased function. The left ventricle demonstrates global hypokinesis. The left ventricular internal cavity size was mildly dilated. There is no left ventricular hypertrophy. Left ventricular diastolic function could not be evaluated. Right Ventricle: The right ventricular size is normal. No increase in right ventricular wall thickness. Right ventricular systolic function is mildly reduced. There is moderately elevated pulmonary artery systolic pressure. The tricuspid regurgitant velocity is 2.90 m/s, and with an assumed  right atrial pressure of 15 mmHg, the estimated right ventricular systolic pressure is 62.6 mmHg. Left Atrium: Left atrial size was mildly dilated. Right Atrium: Right atrial size was normal in size. Pericardium: There is no evidence of pericardial effusion. Mitral Valve: The mitral valve is normal in structure. Moderate mitral valve regurgitation. Tricuspid Valve: The tricuspid valve is normal in structure. Tricuspid valve regurgitation is mild to moderate. Aortic Valve: The aortic valve is normal in structure. Aortic valve regurgitation is not visualized. Mild aortic valve sclerosis is present, with no evidence of aortic valve stenosis. Pulmonic Valve: The pulmonic valve was normal in structure. Pulmonic valve regurgitation is mild. Aorta: The aortic root is normal in size and structure. Venous: The inferior vena cava is dilated in size with less than 50% respiratory variability, suggesting right atrial pressure of 15 mmHg. IAS/Shunts: No atrial level shunt detected by color flow Doppler.  LEFT VENTRICLE PLAX 2D LVIDd:         6.00 cm LVIDs:         5.50 cm LV PW:         0.80 cm LV IVS:        0.90 cm LVOT diam:     2.10 cm LVOT Area:     3.46 cm  IVC IVC diam: 2.40 cm LEFT ATRIUM              Index        RIGHT ATRIUM           Index LA diam:        4.80 cm  2.22 cm/m   RA Area:     20.30 cm LA Vol (A2C):   100.0 ml 46.19 ml/m  RA Volume:   62.10 ml  28.68 ml/m LA Vol (A4C):   76.8 ml  35.47 ml/m LA Biplane Vol: 89.0 ml  41.10 ml/m   AORTA Ao Root diam: 3.20 cm Ao Asc diam:  3.20 cm MR Peak grad: 97.6 mmHg   TRICUSPID VALVE MR Mean grad: 55.0 mmHg   TV Peak grad:   32.5 mmHg MR Vmax:      494.00 cm/s TV Vmax:        2.85 m/s MR Vmean:     334.0 cm/s  TR Peak grad:   33.6 mmHg  TR Vmax:        290.00 cm/s                            SHUNTS                           Systemic Diam: 2.10 cm Charolette Forward MD Electronically signed by Charolette Forward MD Signature Date/Time:  01/12/2021/11:45:30 AM    Final        Signed: Gaylan Gerold, DO Internal Medicine Resident, PGY-2 [facility]  Pager: [phone] 9:55 AM, 01/25/2021

## 2021-01-22 NOTE — Code Documentation (Cosign Needed Addendum)
  Patient Name: [name]   MRN: [id number]   Date of Birth/ Sex: February 02, 1951 , male      Admission Date: 01/11/2021  Attending Provider: Velna Ochs, MD  Primary Diagnosis: ESRD needing dialysis [facility])    Indication: Pt was in his usual state of health until this PM, when he was noted to be unresponsive. Code blue was subsequently called. At the time of arrival on scene, ACLS protocol was underway.   Technical Description:  - CPR performance duration:  NO CPR was performed  - Was defibrillation or cardioversion used? No   - Was external pacer placed? No  - Was patient intubated pre/post CPR? No   Medications Administered: Y = Yes; Blank = No Amiodarone    Atropine    Calcium    Epinephrine    Lidocaine    Magnesium    Norepinephrine    Phenylephrine    Sodium bicarbonate    Vasopressin     Post CPR evaluation:  - Final Status - Was patient successfully resuscitated ? Yes - What is current rhythm? NSR - What is current hemodynamic status? YES  Miscellaneous Information:  - Labs sent, including: CBC; BMP; Blood gas; Mg; Trops  - Primary team notified?  Yes  - Family Notified? Yes  - Additional notes/ transfer status: Patient transferred to progressive; Neuro was consulted due to suspected seizure like activity.     Timothy Lasso, MD  01/22/2021, 7:45 PM

## 2021-01-22 NOTE — TOC Transition Note (Signed)
Transition of Care [facility]) - CM/SW Discharge Note   Patient Details  Name: [name] MRN: [id number] Date of Birth: 04-Oct-1950  Transition of Care [facility]) CM/SW Contact:  [name], Renea Ee, RN Phone Number: 01/22/2021, 4:27 PM   Clinical Narrative:    Patient is scheduled for discharge home today. Patient declined going to CIR, stating he was treated badly the last time he was there. States his son will transport him home today and he will drive self to and from outpatient dialysis.   1644- Nurse notified CM that patient's brother is in his room and upset that patient is discharged home. CM went to patient's room to speak with brother. CM notified brother that patient declined rehab stating he was treated badly the last time he went to one and insisted on going home. Brother states he comes to visit patient every day after work. CM asked him if he ever spoke with a Dr.to get updates. CM reminded him he has to ask to speak with one as MD's are not stationed on one unit. Brother voiced understanding. MD notified and came to speak with brother. Patient's discharge is cancelled.   CM had called Jermaine with [employer] [phone]) to order hoyer lift, wheelchair, bedside commode and sliding board. CM called him again to put a hold on order until patient is discharged.  Access [place] application faxed to [employer] for transportation to and from dialysis. Awaiting response.  CM will continue to follow with needs.     Final next level of care: Home/Self Care Barriers to Discharge: Barriers Resolved   Patient Goals and CMS Choice Patient states their goals for this hospitalization and ongoing recovery are:: To go home CMS Medicare.gov Compare Post Acute Care list provided to:: Patient Choice offered to / list presented to : Patient  Discharge Placement                       Discharge Plan and Services In-house Referral: Hospice / Palliative Care Discharge Planning  Services: CM Consult Post Acute Care Choice: Home Health          DME Arranged: N/A DME Agency: NA       HH Arranged: NA HH Agency: NA        Social Determinants of Health (SDOH) Interventions     Readmission Risk Interventions Readmission Risk Prevention Plan 01/15/2021 02/02/2019  Transportation Screening Complete Complete  PCP or Specialist Appt within 5-7 Days - Complete  PCP or Specialist Appt within 3-5 Days Complete -  Home Care Screening - Complete  Medication Review (RN CM) - Complete  HRI or Home Care Consult Complete -  Social Work Consult for Recovery Care Planning/Counseling Complete -  Palliative Care Screening Complete -  Medication Review Press photographer) Complete -  Some recent data might be hidden

## 2021-01-22 NOTE — Progress Notes (Addendum)
IP rehab admissions:  I have received approval from [facility] appeals department for acute inpatient rehab admission.  I do not have a CIR bed available today for this patient.  I will have my partner follow up tomorrow for bed availability and medical readiness.  Call for questions.  [phone]  Received call from attending MD that patient is now wanting to DC home and plan is to DC home today.  Will sign off for inpatient rehab at this point.

## 2021-01-22 NOTE — Consult Note (Addendum)
NEURO HOSPITALIST CONSULT NOTE   Requestig physician: Dr. Philipp Ovens  Reason for Consult: Seizure  History obtained from:   Patient and Chart     HPI:                                                                                                                                          [name] is an 70 y.o. 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 Cardiac enzymes were elevated, thought to be due to demand ischemia. He was diagnosed with new ESRD this admission, which was felt most likely to be due to cardiorenal syndrome. He has had 4 HD sessions so far this admission. Patient had an AV graft placed on 11/14 by Vascular Surgery. 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   This evening, patient was noted to be acutely unresponsive to voice and sternal rub after an episode of vomiting. He was noted by RN to be biting down on his tongue which seemed pale. Seizure was suspected. Duration was approximately 3 minutes. He was noted to be incontinent of stool and urine. The patient's pulse was palpable throughout the event. He was initially confused after the spell, then relatively quickly returned to his cognitive baseline.   Neurology was consulted to evaluate possible seizure.   Past Medical History:  Diagnosis Date   CHF (congestive heart failure) (HCC)    Chronic Kidney Disease IV    Exertional shortness of breath    "before my last OR; I'm fine now" (09/26/2012)   GERD (gastroesophageal reflux disease)    High cholesterol    Hx  of seasonal allergies    Hypertension    Myocardial infarction [facility])    Scrotal edema 01/30/2019   Stroke ([place]) 2009   memory loss   Type II diabetes mellitus ([place])     Past Surgical History:  Procedure Laterality Date   AMPUTATION Right 07/29/2012   Procedure: AMPUTATION 3RD TOE;  Surgeon: Kerin Salen, MD;  Location: [place];  Service: Orthopedics;  Laterality: Right;  right third toe amputation   AMPUTATION Right 08/02/2012   Procedure: right transmetatarsal amputation;  Surgeon: Kerin Salen, MD;  Location: [place];  Service: Orthopedics;  Laterality: Right;   AMPUTATION Right 09/06/2012   Procedure: AMPUTATION BELOW KNEE;  Surgeon: Kerin Salen, MD;  Location: [place];  Service: Orthopedics;  Laterality: Right;   AMPUTATION Left 09/27/2012   Procedure: AMPUTATION BELOW KNEE;  Surgeon: Kerin Salen, MD;  Location: [place];  Service: Orthopedics;  Laterality: Left;   AV FISTULA PLACEMENT Left 01/19/2021   Procedure: ARTERIOVENOUS (AV) FISTULA OF LEFT ARM  USING GRAFT;  Surgeon: Angelia Mould, MD;  Location: [place];  Service: Vascular;  Laterality: Left;   CATARACT EXTRACTION W/ INTRAOCULAR LENS  IMPLANT, BILATERAL  ?2010   CORONARY ARTERY BYPASS GRAFT  2008   CABG X4   ESOPHAGOGASTRODUODENOSCOPY N/A 10/08/2012   Procedure: ESOPHAGOGASTRODUODENOSCOPY (EGD);  Surgeon: Winfield Cunas., MD;  Location: [facility] ENDOSCOPY;  Service: Endoscopy;  Laterality: N/A;   IR FLUORO GUIDE CV LINE RIGHT  01/14/2021   IR US GUIDE VASC ACCESS RIGHT  01/14/2021   PERIPHERALLY INSERTED CENTRAL CATHETER INSERTION Right 09/2012   upper arm    History reviewed. No pertinent family history.           Social History:  reports that he has quit smoking. His smoking use included cigarettes. He has a 2.40 pack-year smoking history. He has never used smokeless tobacco. He reports that he does not drink alcohol and does not use drugs.  No Known Allergies  MEDICATIONS:                                                                                                                      Scheduled:  (feeding supplement) PROSource Plus  30 mL Oral BID BM   amiodarone  200 mg Oral BID   aspirin EC  81 mg Oral q morning   atorvastatin  40 mg Oral q morning   Chlorhexidine Gluconate Cloth  6 each Topical Q0600   Chlorhexidine Gluconate Cloth  6 each Topical Q0600   clopidogrel  75 mg Oral Daily   darbepoetin (ARANESP) injection - DIALYSIS  60 mcg Intravenous Q Thu-HD   feeding supplement (NEPRO CARB STEADY)  237 mL Oral BID BM   gabapentin  300 mg Oral QHS   heparin sodium (porcine)       insulin aspart  0-15 Units Subcutaneous TID WC   insulin aspart  4 Units Subcutaneous TID WC   isosorbide mononitrate  30 mg Oral Daily   metoprolol succinate  50 mg Oral q morning   multivitamin  1 tablet Oral QHS   pantoprazole  40 mg Oral Q0600   sodium bicarbonate  650 mg Oral BID   Continuous:  sodium chloride Stopped (01/13/21 1834)   sodium chloride      ROS:  Does not recall any of the events within the timeframe of the seizure. Stated chronic left facial droop. Not endorsing any confusion. No current complaints. Other ROS as per HPI.    Blood pressure 126/64, pulse 64, temperature 98.7 F (37.1 C), temperature source Oral, resp. rate (!) 8, height 5\' 8"  (1.727 m), weight 100 kg, SpO2 98 %.   General Examination:                                                                                                       Physical Exam  HEENT-  [place]/AT    Lungs- Respirations unlabored Extremities- No edema. Bilateral BKA.   Neurological Examination Mental Status: Awake and alert. Speech mildly dysarthric, but fluent with intact naming and comprehension. Oriented to the month, year, city and state, but not to the day. No confusion noted.  Cranial Nerves: II: Temporal visual fields  intact with no extinction to DSS. PERRL.   III,IV, VI: EOMI. No nystagmus. No ptosis.  V: Temp sensation intact bilaterally VII: Left facial droop (chronic per patient) VIII: Hearing intact to voice IX,X: No hypophonia XI: Lag on the left with shoulder shrug XII: Midline tongue extension Motor: RUE and RLE 5/5 LUE and LLE 4+/5 Sensory: Temp and light touch intact to proximal upper and lower extremities. No extinction to DSS.  Deep Tendon Reflexes: 1-2+ bilateral brachioradialis and biceps. 0 bilateral patellae.  Cerebellar: No ataxia with FNF bilaterally. Low amplitude asterixis at wrists with arms outstretched.  Gait: Unable to assess   Lab Results: Basic Metabolic Panel: Recent Labs  Lab 01/17/21 0735 01/18/21 0432 01/19/21 0458 01/20/21 0356 01/22/21 0348  NA 128* 130* 131* 129* 132*  K 3.6 3.6 3.7 4.8 4.2  CL 94* 96* 97* 94* 97*  CO2 22 22 20* 20* 23  GLUCOSE 162* 128* 192* 299* 163*  BUN 56* 41* 61* 77* 64*  CREATININE 6.56* 5.28* 6.70* 8.08* 7.08*  CALCIUM 8.0* 7.9* 8.2* 8.2* 8.1*  PHOS 4.2  --   --   --   --     CBC: Recent Labs  Lab 01/17/21 0735 01/18/21 0432 01/20/21 0356 01/21/21 0254 01/22/21 0348  WBC 8.0 7.9 13.4* 10.8* 9.2  HGB 8.9* 8.9* 8.7* 8.7* 8.2*  HCT 27.0* 27.3* 27.0* 26.6* 25.9*  MCV 90.6 89.2 90.3 89.3 92.2  PLT 78* 76* 81* 82* 97*    Cardiac Enzymes: No results for input(s): CKTOTAL, CKMB, CKMBINDEX, TROPONINI in the last 168 hours.  Lipid Panel: No results for input(s): CHOL, TRIG, HDL, CHOLHDL, VLDL, LDLCALC in the last 168 hours.  Imaging: DG CHEST PORT 1 VIEW  Result Date: 01/22/2021 CLINICAL DATA:  Acute coronary syndrome. EXAM: PORTABLE CHEST 1 VIEW COMPARISON:  01/11/2021 FINDINGS: Right-sided dialysis catheter is been placed with tip overlying the lower SVC. Patient is post median sternotomy and CABG. Cardiomegaly is not significantly changed. Unchanged mediastinal contours with aortic atherosclerosis. There are streaky  bilateral mid lung opacities are not significantly changed, favor scarring. No convincing pulmonary edema. No pneumothorax. No large pleural effusions. IMPRESSION: 1. Right-sided dialysis catheter in  place. No pneumothorax. 2. Stable cardiomegaly and streaky bilateral mid lung opacities, favor scarring. Electronically Signed   By: Keith Rake M.D.   On: 01/22/2021 19:09     Assessment: 70 year old male with new onset seizure 1. Exam reveals chronic left facial droop, mild chronic left arm and leg weakness, and low amplitude asterixis at wrists with arms outstretched. No confusion or seizure-like movements noted.  2. Asterixis on exam is most likely secondary to metabolic derangements.  3. Ionized Ca is low, which may have precipitated the patient's seizure. Dialysis disequilibrium syndrome is also on the DDx given that he started receiving dialysis treatments for the first time this admission.   Recommendations:  1. EEG in the AM (ordered) 2. MRI brain without contrast 3. Mg level is pending 4. Inpatient seizure precautions 5. Correction of electrolyte disturbances, including Ca.  6. Consider changing dialysis parameters towards the goal of less rapid metabolite shifts.   Addendum: - MRI brain reveals a small acute infarct of the left pons, a punctate focus of acute ischemia in the right corona radiata and old left cerebellar and right basal ganglia infarcts as well as chronic microvascular ischemic changes.  - Obtain MRA of head and carotid ultrasound to complete stroke work up (has recent echocardiogram).  - Cardiac telemetry - Continue ASA, Plavix and statin - PT/OT/Speech  Electronically signed: Dr. Kerney Elbe 01/22/2021, 7:15 PM

## 2021-01-22 NOTE — Plan of Care (Signed)

## 2021-01-22 NOTE — Progress Notes (Signed)
[name] was moved to 6E after Code Blue this evening. He doesn't recall anything leading up to him becoming unresponsive. He reports feeling at his baseline. He is alert and oriented to person place time and situation. No chest pain. Neurology has been consulted for possible seizure.    Vitals:   01/22/21 1845 01/22/21 1957  BP: 126/64 94/70  Pulse: 64 (!) 58  Resp:  20  Temp:  99 F (37.2 C)  SpO2: 98% 99%    Gen: NAD, Bilateral BKAs  CV: Regular rhythm, bradycardic  Pulm: normal work of breathing , no wheezing , sating 99% on 10 L HFNC now on my exam.    -Labs reviewed after code. Mag Wnl. Ionized Ca 1.05. Bun 34. Na 132. Chest xray similar to previous 11/6. No convincing pulmonary edema. No pneumothorax. No large pleural effusions.    - EEG has been ordered by neuro - MRI Brain wo contrast ordered - Q6h vitals  - Trend troponin

## 2021-01-22 NOTE — Progress Notes (Signed)
Bp rechecked.  

## 2021-01-22 NOTE — Progress Notes (Signed)
RN secure chat MD to report that a family member is at the bedside stating that patient is unsafe to go home.  Per primary team, CIR was recommended for patient however patient refused CIR today stating that he wanted to go home. MD went to the bedside to see patient.  Patient's brother, Mortimer Fries, who was at the bedside inform MD that patient does not have anyone to take care of him at home in his current state.  His wife is not strong enough to take care of him and his son lives close to them but is not always available to help patient.  When patient was asked about his decision to go home, he stated that he was tired of being in the hospital.  He also reports getting suboptimal care previously at a SNF causing him to have infection in his leg and eventually amputation. Patient states he would like to get stronger now willing to go to rehab whether inpatient or SNF. Called son, Kasandra Knudsen, who inform MD that he was here in the hospital to see his dad.  He inform MD that he works a lot and is that does not have a lot of support at home and prefers for him to get rehab.   Around 1830, as MD went to chat with patient's son at bedside, code blue was called for patient.

## 2021-01-22 NOTE — Progress Notes (Addendum)
Situation:  RN called to bedside by son d/t pt actively vomiting. Pt noted to be unresponsive to voice and sternal rub. Pt was biting down on tongue which seemed pale. Seizure suspected. Duration approximately 3 minutes. Emesis yellow tan large amount noted over pt. He also was found in stool and large amt of urine. RN called for CODE BLUE MD to bedside. BVM rescue breathing initiated approximately at 18:32. Pt's pulse palpable throughout event. No chest compressions as pt was never found to be pulseless. This was a respiratory arrest secondary to seizure like activity.  Background:   Pt came in for BLE swelling dx with new ESRD and AKI. Of note pt was dialyzed today s/p 2L off. Pt was given albumin and upon return to unit MD consulted and Toprol and Imdur held. Amiodarone was given as scheduled (hx of v tach upon admission).   Assessment:  RT attempted to place ETT but pt at that point had a strong gag with audible moaning and groaning. Following commands, moving all extremities spontaneously. Pupil 32mm bilaterally reactive. Labs, ABG ordered, chest xray obtained, HD catheter accessed by IV team with authorization from provider.   Recommendation:  Pt needs higher level of care as episode was sudden and of unknown etiology. Needs PIV access.  Report called to [place]

## 2021-01-22 NOTE — Progress Notes (Addendum)
Right IJ clean and dry during overnight. This am area started bleeding dsg saturated and oozing. IMTS contacted and ordered placed for Thrombipad. IV team contacted would come up when Thrombipad on unit. Unable to reach IV team when thrombipad arrived on unit. Pressure dsg placed. Ordered modified to notify IV team thrombipad on unit. Pt left for hemo IV team had not been yet to change dsg. Reported off to oncoming RN [name] regarding dsg.

## 2021-01-22 NOTE — Progress Notes (Signed)
Physical Therapy Treatment Patient Details Name: [name] MRN: [id number] DOB: [birth year] Today's Date: 01/22/2021   History of Present Illness Pt adm 11/6 with acute on chronic HFrEF. Pt developed Vtach and transferred to ICU. Pt with continued worsening of CKD and going to require HD. L AVG placed 11/14. PMH: HTN, T2DM, previous CVA, s/p CABG x 4, HLD, CKD IV, HFrEF (last EF 35-40% in 2020), and bilateral BKAs    PT Comments    Pt limited by fatigue this session after HDU session. Pt continues to require significant physical assistance to transfer at this time, showing generalized weakness affecting functional mobility and balance. Pt appears to have impaired awareness of his current deficits, feeling his ex-wife may be able to provide sufficient physical assistance for him to laterally transfer, despite also reporting a significant increase in difficulty compared to baseline. PT continues to recommend CIR placement.   Recommendations for follow up therapy are one component of a multi-disciplinary discharge planning process, led by the attending physician.  Recommendations may be updated based on patient status, additional functional criteria and insurance authorization.  Follow Up Recommendations  Acute inpatient rehab (3hours/day)     Assistance Recommended at Discharge Intermittent Supervision/Assistance  Equipment Recommendations  Other (comment) (hoyer lift, sliding board)    Recommendations for Other Services       Precautions / Restrictions Precautions Precautions: Fall;Other (comment) Precaution Comments: bilateral BKA Required Braces or Orthoses: Other Brace Other Brace: Bilateral prostheses Restrictions Weight Bearing Restrictions: No     Mobility  Bed Mobility Overal bed mobility: Needs Assistance Bed Mobility: Supine to Sit     Supine to sit: Mod assist;HOB elevated     General bed mobility comments: pt requiring assistance to pivot hips with use of bed  pad    Transfers Overall transfer level: Needs assistance Equipment used:  (bed pad) Transfers: Bed to chair/wheelchair/BSC            Lateral/Scoot Transfers: Max assist;From elevated surface General transfer comment: pt requiring maxA to laterally scoot from bed to drop arm recliner. bed elevated to allow for downhill scoot. pt requiring cues to maintain head forward, no clearance of buttocks noted with scoot attempts.    Ambulation/Gait                   Stairs             Wheelchair Mobility    Modified Rankin (Stroke Patients Only)       Balance Overall balance assessment: Needs assistance Sitting-balance support: Single extremity supported;Bilateral upper extremity supported;Feet supported Sitting balance-Leahy Scale: Poor Sitting balance - Comments: posterior lean Postural control: Posterior lean                                  Cognition Arousal/Alertness: Awake/alert Behavior During Therapy: WFL for tasks assessed/performed Overall Cognitive Status: Impaired/Different from baseline Area of Impairment: Safety/judgement;Awareness                         Safety/Judgement: Decreased awareness of safety;Decreased awareness of deficits Awareness: Emergent            Exercises      General Comments General comments (skin integrity, edema, etc.): VSS on RA      Pertinent Vitals/Pain Pain Assessment: No/denies pain    Home Living  Prior Function            PT Goals (current goals can now be found in the care plan section) Acute Rehab PT Goals Patient Stated Goal: Return to prior level of function Progress towards PT goals: Not progressing toward goals - comment (limited by fatigue and weakness)    Frequency    Min 3X/week      PT Plan Current plan remains appropriate    Co-evaluation              AM-PAC PT "6 Clicks" Mobility   Outcome Measure  Help  needed turning from your back to your side while in a flat bed without using bedrails?: A Little Help needed moving from lying on your back to sitting on the side of a flat bed without using bedrails?: A Lot Help needed moving to and from a bed to a chair (including a wheelchair)?: A Lot Help needed standing up from a chair using your arms (e.g., wheelchair or bedside chair)?: Total Help needed to walk in hospital room?: Total Help needed climbing 3-5 steps with a railing? : Total 6 Click Score: 10    End of Session   Activity Tolerance: Patient limited by fatigue Patient left: in chair;with call bell/phone within reach;with chair alarm set Nurse Communication: Mobility status;Need for lift equipment PT Visit Diagnosis: Other abnormalities of gait and mobility (R26.89);Muscle weakness (generalized) (M62.81)     Time: 4492-0100 PT Time Calculation (min) (ACUTE ONLY): 32 min  Charges:  $Therapeutic Activity: 23-37 mins                     Zenaida Niece, PT, DPT Acute Rehabilitation Pager: [phone] Office [place] Dontell Mian 01/22/2021, 3:30 PM

## 2021-01-22 NOTE — Progress Notes (Signed)
Chaplain responded to this Code Blue.  Upon arrival patient's son in the hallway. Chaplain offered support and engaged son in conversation.  Patient is married but spouse unable to visit due to her own health issues.  Patient has siblings and son was going to call them.  Patient has 2 other sons, but one present provides most of the care.  Chaplain provided empathetic/reflective listening and words of comfort and support.  He will be able to go back to the bedside soon.  Chaplain available as needed. [place], Mdiv.    01/22/21 1900  Clinical Encounter Type  Visited With Family;Patient not available  Visit Type Code  Referral From Nurse  Consult/Referral To Chaplain  Stress Factors  Family Stress Factors Health changes

## 2021-01-22 NOTE — Procedures (Signed)
I was present at this dialysis session. I have reviewed the session itself and made appropriate changes.   Vital signs in last 24 hours:  Temp:  [98 F (36.7 C)-98.6 F (37 C)] 98.1 F (36.7 C) (11/17 0755) Pulse Rate:  [59-67] 59 (11/17 0808) Resp:  [18-20] 18 (11/17 0755) BP: (91-108)/(42-80) 102/53 (11/17 0808) SpO2:  [96 %-100 %] 99 % (11/17 0755) Weight:  [101.6 kg-105.6 kg] 101.6 kg (11/17 0755) Weight change: 0.6 kg Filed Weights   01/21/21 2105 01/22/21 0408 01/22/21 0755  Weight: 105.6 kg 105.6 kg 101.6 kg    Recent Labs  Lab 01/17/21 0735 01/18/21 0432 01/22/21 0348  NA 128*   < > 132*  K 3.6   < > 4.2  CL 94*   < > 97*  CO2 22   < > 23  GLUCOSE 162*   < > 163*  BUN 56*   < > 64*  CREATININE 6.56*   < > 7.08*  CALCIUM 8.0*   < > 8.1*  PHOS 4.2  --   --    < > = values in this interval not displayed.    Recent Labs  Lab 01/20/21 0356 01/21/21 0254 01/22/21 0348  WBC 13.4* 10.8* 9.2  HGB 8.7* 8.7* 8.2*  HCT 27.0* 26.6* 25.9*  MCV 90.3 89.3 92.2  PLT 81* 82* 97*    Scheduled Meds:  (feeding supplement) PROSource Plus  30 mL Oral BID BM   amiodarone  200 mg Oral BID   aspirin EC  81 mg Oral q morning   atorvastatin  40 mg Oral q morning   Chlorhexidine Gluconate Cloth  6 each Topical Q0600   Chlorhexidine Gluconate Cloth  6 each Topical Q0600   clopidogrel  75 mg Oral Daily   feeding supplement (NEPRO CARB STEADY)  237 mL Oral BID BM   gabapentin  300 mg Oral QHS   insulin aspart  0-15 Units Subcutaneous TID WC   insulin aspart  4 Units Subcutaneous TID WC   isosorbide mononitrate  30 mg Oral Daily   metoprolol succinate  50 mg Oral q morning   multivitamin  1 tablet Oral QHS   pantoprazole  40 mg Oral Q0600   sodium bicarbonate  650 mg Oral BID   Thrombi-Pad  1 each Topical Once   Continuous Infusions:  sodium chloride Stopped (01/13/21 1834)   PRN Meds:.sodium chloride, acetaminophen **OR** acetaminophen, melatonin, nitroGLYCERIN,  oxyCODONE-acetaminophen, senna-docusate    Assessment/Plan:   New ESRD - progression likely exacerbated by cardiorenal syndrome. S/p 3 HD sessions and due for HD tomorrow.  He is currently set up for TTS at [facility] once out of CIR. Chronic systolic CHF - improved volume status with HD and UF. 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  Will dose again today.  Thrombocytopenia - no heparin Abnormal LFT's- per primary. HTN -stable Vascular access -s /p LUE AVG placement 01/19/21 with some edema but no pain.  Continue to follow for evidence of steal syndrome Disposition - for CIR pending insurance clearance.  Set up for HD at [facility] on TTS schedule and will continue with that schedule while he remains an inpatient.   Donetta Potts,  MD 01/22/2021, 8:14 AM

## 2021-01-23 ENCOUNTER — Inpatient Hospital Stay (HOSPITAL_COMMUNITY): Payer: Medicare Other

## 2021-01-23 ENCOUNTER — Other Ambulatory Visit (HOSPITAL_COMMUNITY): Payer: Self-pay

## 2021-01-23 DIAGNOSIS — I639 Cerebral infarction, unspecified: Secondary | ICD-10-CM

## 2021-01-23 DIAGNOSIS — R569 Unspecified convulsions: Secondary | ICD-10-CM

## 2021-01-23 LAB — CBC WITH DIFFERENTIAL/PLATELET
Abs Immature Granulocytes: 0.07 10*3/uL (ref 0.00–0.07)
Basophils Absolute: 0 10*3/uL (ref 0.0–0.1)
Basophils Relative: 0 %
Eosinophils Absolute: 0 10*3/uL (ref 0.0–0.5)
Eosinophils Relative: 0 %
HCT: 27.1 % — ABNORMAL LOW (ref 39.0–52.0)
Hemoglobin: 8.7 g/dL — ABNORMAL LOW (ref 13.0–17.0)
Immature Granulocytes: 1 %
Lymphocytes Relative: 8 %
Lymphs Abs: 0.9 10*3/uL (ref 0.7–4.0)
MCH: 29.2 pg (ref 26.0–34.0)
MCHC: 32.1 g/dL (ref 30.0–36.0)
MCV: 90.9 fL (ref 80.0–100.0)
Monocytes Absolute: 1.3 10*3/uL — ABNORMAL HIGH (ref 0.1–1.0)
Monocytes Relative: 12 %
Neutro Abs: 9.1 10*3/uL — ABNORMAL HIGH (ref 1.7–7.7)
Neutrophils Relative %: 79 %
Platelets: 108 10*3/uL — ABNORMAL LOW (ref 150–400)
RBC: 2.98 MIL/uL — ABNORMAL LOW (ref 4.22–5.81)
RDW: 17.4 % — ABNORMAL HIGH (ref 11.5–15.5)
WBC: 11.5 10*3/uL — ABNORMAL HIGH (ref 4.0–10.5)
nRBC: 1.2 % — ABNORMAL HIGH (ref 0.0–0.2)

## 2021-01-23 LAB — RENAL FUNCTION PANEL
Albumin: 3 g/dL — ABNORMAL LOW (ref 3.5–5.0)
Anion gap: 16 — ABNORMAL HIGH (ref 5–15)
BUN: 39 mg/dL — ABNORMAL HIGH (ref 8–23)
CO2: 23 mmol/L (ref 22–32)
Calcium: 8.3 mg/dL — ABNORMAL LOW (ref 8.9–10.3)
Chloride: 95 mmol/L — ABNORMAL LOW (ref 98–111)
Creatinine, Ser: 4.96 mg/dL — ABNORMAL HIGH (ref 0.61–1.24)
GFR, Estimated: 12 mL/min — ABNORMAL LOW (ref >60)
Glucose, Bld: 87 mg/dL (ref 70–99)
Phosphorus: 3.9 mg/dL (ref 2.5–4.6)
Potassium: 4.1 mmol/L (ref 3.5–5.1)
Sodium: 134 mmol/L — ABNORMAL LOW (ref 135–145)

## 2021-01-23 LAB — TROPONIN I (HIGH SENSITIVITY)
Troponin I (High Sensitivity): 318 ng/L (ref <18)
Troponin I (High Sensitivity): 340 ng/L (ref <18)
Troponin I (High Sensitivity): 336 ng/L (ref <18)

## 2021-01-23 LAB — GLUCOSE, CAPILLARY
Glucose-Capillary: 115 mg/dL — ABNORMAL HIGH (ref 70–99)
Glucose-Capillary: 154 mg/dL — ABNORMAL HIGH (ref 70–99)
Glucose-Capillary: 190 mg/dL — ABNORMAL HIGH (ref 70–99)
Glucose-Capillary: 208 mg/dL — ABNORMAL HIGH (ref 70–99)

## 2021-01-23 MED ORDER — ATORVASTATIN CALCIUM 40 MG PO TABS
40.0000 mg | ORAL_TABLET | Freq: Every day | ORAL | Status: DC
  Filled 2021-01-23: qty 1

## 2021-01-23 MED ORDER — ATORVASTATIN CALCIUM 80 MG PO TABS
80.0000 mg | ORAL_TABLET | Freq: Every day | ORAL | Status: DC
  Administered 2021-01-23 – 2021-01-24 (×2): 80 mg via ORAL
  Filled 2021-01-23 (×2): qty 1

## 2021-01-23 MED ORDER — AMIODARONE HCL 200 MG PO TABS
200.0000 mg | ORAL_TABLET | Freq: Every day | ORAL | Status: DC
  Administered 2021-01-23 – 2021-01-25 (×3): 200 mg via ORAL
  Filled 2021-01-23 (×3): qty 1

## 2021-01-23 MED ORDER — ORAL CARE MOUTH RINSE
15.0000 mL | Freq: Two times a day (BID) | OROMUCOSAL | Status: DC
  Administered 2021-01-23 – 2021-01-25 (×3): 15 mL via OROMUCOSAL

## 2021-01-23 NOTE — Progress Notes (Signed)
OT Cancellation Note  Patient Details Name: ANGELINO RUMERY MRN: [id number] DOB: 09/06/1950   Cancelled Treatment:    Reason Eval/Treat Not Completed: Patient declined, no reason specified (Patient declined getting up at this time and asked OT to return later today) Lodema Hong, [place]  Pager [phone] Office [place] 01/23/2021, 8:39 AM

## 2021-01-23 NOTE — Progress Notes (Signed)
PT Cancellation Note  Patient Details Name: [name] MRN: [id number] DOB: 1951/02/14   Cancelled Treatment:    Reason Eval/Treat Not Completed: Medical issues which prohibited therapy. Code called yesterday evening, 11/17, for suspected seizure. MRI brain revealed a small acute infarct of the left pons, a punctate focus of acute ischemia in the right corona radiata and old left cerebellar and right basal ganglia infarcts as well as chronic microvascular ischemic changes. Pt is scheduled for MRI without contrast today as well as EEG. Pt in bed, very lethargic. Declining participation in therapy.    Lorriane Shire 01/23/2021, 11:30 AM  Lorrin Goodell, PT  Office # [phone] Pager [phone]

## 2021-01-23 NOTE — Care Management Important Message (Signed)
Important Message  Patient Details  Name: ILIJAH DOUCET MRN: [id number] Date of Birth: [birth year]   Medicare Important Message Given:  Yes     Shelda Altes 01/23/2021, 11:23 AM

## 2021-01-23 NOTE — H&P (Addendum)
Physical Medicine and Rehabilitation Admission H&P    Chief Complaint  Patient presents with   Functional decline.     HPI: [name] is a 70 year old male with history of HTN, CAD s/p CABG, CVA w/STM deficits, T2DM, B-BKAs, CKD IV who has refused HD in the past and was admitted on 01/11/21 with N/V and bilateral LE edema due to acute on chronic CHF. History taken from chart review and patient. Echocardiogram with EF of 30-35% and global hypokinesis. He was evaluated by Dr. Terrence Dupont and was started on IV lasix for diuresis.  EKG changes felt to be due to NSVT and was started on amiodarone for rate control.  Elevated cardiac enzymes felt to be due to demand ischemia and patient with cardiorenal syndrome with symptoms of uremia. HD initiated and AVG placed by Dr. Scot Dock on 01/19/21 and post op has had edema RUE but without numbness or tingling.    He was scheduled for discharge but became unresponsive on 01/22/21, was incontinent of B/B and confused when aroused. Dr. Cheral Marker consulted due to concerns of seizure activity and exam revealed asterixis likely due to metabolic derangements and questioned seizure due to low calcium. MRI brain done for work up and showed small acute left pontine infarct. 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    EEG was suggestive of mild diffuse encephalopathy without seizure or epileptiform discharges.   Stroke felt to be due to small/large vessel disease but cardioembolic source not ruled out. Dr. Erlinda Hong recommended ASA/plavix X 3 weeks followed by Plavix alone as well as 30 day cardiac event monitor to rule out A fib. . Patient with limitations in mobility and ADLs. CIR recommended due to functional decline. Please see preadmission assessment from earlier today as well.    Review of Systems  Constitutional:  Positive for malaise/fatigue.  Respiratory:  Positive for shortness of breath.   Musculoskeletal:  Positive for joint pain and  myalgias.  Neurological:  Positive for weakness.  All other systems reviewed and are negative.   Past Medical History:  Diagnosis Date   CHF (congestive heart failure) (HCC)    Chronic Kidney Disease IV    Exertional shortness of breath    "before my last OR; I'm fine now" (09/26/2012)   GERD (gastroesophageal reflux disease)    High cholesterol    Hx of seasonal allergies    Hypertension    Myocardial infarction [facility])    Scrotal edema 01/30/2019   Stroke ([place]) 2009   memory loss   Type II diabetes mellitus ([place])     Past Surgical History:  Procedure Laterality Date   AMPUTATION Right 07/29/2012   Procedure: AMPUTATION 3RD TOE;  Surgeon: Kerin Salen, MD;  Location: [place];  Service: Orthopedics;  Laterality: Right;  right third toe amputation   AMPUTATION Right 08/02/2012   Procedure: right transmetatarsal amputation;  Surgeon: Kerin Salen, MD;  Location: [place];  Service: Orthopedics;  Laterality: Right;   AMPUTATION Right 09/06/2012   Procedure: AMPUTATION BELOW KNEE;  Surgeon: Kerin Salen, MD;  Location: [place];  Service: Orthopedics;  Laterality: Right;   AMPUTATION Left 09/27/2012   Procedure: AMPUTATION BELOW KNEE;  Surgeon: Kerin Salen, MD;  Location: [place];  Service: Orthopedics;  Laterality: Left;   AV FISTULA PLACEMENT Left 01/19/2021   Procedure: ARTERIOVENOUS (AV) FISTULA OF LEFT ARM  USING GRAFT;  Surgeon: Angelia Mould, MD;  Location: [place];  Service: Vascular;  Laterality:  Left;   CATARACT EXTRACTION W/ INTRAOCULAR LENS  IMPLANT, BILATERAL  ?2010   CORONARY ARTERY BYPASS GRAFT  2008   CABG X4   ESOPHAGOGASTRODUODENOSCOPY N/A 10/08/2012   Procedure: ESOPHAGOGASTRODUODENOSCOPY (EGD);  Surgeon: Winfield Cunas., MD;  Location: [facility] ENDOSCOPY;  Service: Endoscopy;  Laterality: N/A;   IR FLUORO GUIDE CV LINE RIGHT  01/14/2021   IR US GUIDE VASC ACCESS RIGHT  01/14/2021   PERIPHERALLY INSERTED CENTRAL CATHETER INSERTION Right 09/2012   upper arm    History  reviewed. No pertinent family history.   Social History:  reports that he has quit smoking. His smoking use included cigarettes. He has a 2.40 pack-year smoking history. He has never used smokeless tobacco. He reports that he does not drink alcohol and does not use drugs.   Allergies: No Known Allergies   Medications Prior to Admission  Medication Sig Dispense Refill   amiodarone (PACERONE) 200 MG tablet Take 1 tablet (200 mg total) by mouth daily. Hold if HR < 55     aspirin EC 81 MG EC tablet Take 1 tablet (81 mg total) by mouth every morning for 21 days. Swallow whole. 21 tablet 0   atorvastatin (LIPITOR) 80 MG tablet Take 1 tablet (80 mg total) by mouth at bedtime.     clopidogrel (PLAVIX) 75 MG tablet Take 1 tablet (75 mg total) by mouth daily.     Cyanocobalamin (VITAMIN B-12 PO) Take 1 tablet by mouth daily.     gabapentin (NEURONTIN) 300 MG capsule Take 1 capsule (300 mg total) by mouth at bedtime. 90 capsule 0   nitroGLYCERIN (NITROSTAT) 0.4 MG SL tablet Place 0.4 mg under the tongue every 5 (five) minutes as needed for chest pain.     pantoprazole (PROTONIX) 40 MG tablet Take 1 tablet (40 mg total) by mouth daily at 6 (six) AM. 30 tablet 3   Semaglutide (RYBELSUS) 3 MG TABS Take 1 tablet (3 mg) by mouth daily. 30 tablet 0   sodium bicarbonate 650 MG tablet Take 650 mg by mouth 2 (two) times daily.      Drug Regimen Review  Drug regimen was reviewed and remains appropriate with no significant issues identified  Home: Home Living Family/patient expects to be discharged to:: Private residence Living Arrangements: Other (Comment) (ex wife) Available Help at Discharge: Family Type of Home: House Home Access: Stairs to enter Technical brewer of Steps: 4 Entrance Stairs-Rails: Right Home Layout: One level Bathroom Shower/Tub: Other (comment) (sponges bathes at sink) Biochemist, clinical: Standard Home Equipment: Conservation officer, nature (2 wheels), [employer] - single point, Wheelchair -  manual Additional Comments: reports his w/c arm is broken, gave his BSC away   Functional History: Prior Function Prior Level of Function : Independent/Modified Independent, Driving Mobility Comments: Modified independent uses prosthetics and cane ADLs Comments: sponge bathes in standing at sink  Functional Status:  Mobility: Bed Mobility Overal bed mobility: Needs Assistance Bed Mobility: Rolling, Supine to Sit, Sit to Supine Rolling: Mod assist Supine to sit: Mod assist, HOB elevated Sit to supine: Mod assist General bed mobility comments: modA to complete rolls, elevate trunk, and return BLE to bed. the pt needed sequential cues to complete movements and to assist with BUE. able to assist with boosting in bed when directly cued for use of BUE on bed rails Transfers Overall transfer level: Needs assistance Equipment used:  (bed pad) Transfers: Bed to chair/wheelchair/BSC Bed to/from chair/wheelchair/BSC transfer type:: Lateral/scoot transfer Anterior-Posterior transfers: Max assist, +2 physical assistance  Lateral/Scoot  Transfers: Max assist, From elevated surface General transfer comment: unable to achieve any movement with lateral scoot transfer. The pt did not follow commands to assist with movement and could not produce any movement on his own, fatigued after 10 min sitting EOB Ambulation/Gait General Gait Details: Unable due to LE's too swollen for prosthetics to fit    ADL: ADL Overall ADL's : Needs assistance/impaired Eating/Feeding: Minimal assistance, Sitting Eating/Feeding Details (indicate cue type and reason): assist to open containers Grooming: Wash/dry hands, Sitting, Set up Grooming Details (indicate cue type and reason): performed in recliner Upper Body Bathing: Set up, Sitting Lower Body Bathing: Sitting/lateral leans, Maximal assistance Upper Body Dressing : Set up, Sitting Lower Body Dressing: Sitting/lateral leans, Maximal assistance Toilet Transfer: +2  for physical assistance, Minimal assistance (lateral transfer) Toileting- Clothing Manipulation and Hygiene: Moderate assistance, Sitting/lateral lean General ADL Comments: only agreeable to grooming this am  Cognition: Cognition Overall Cognitive Status: Impaired/Different from baseline Orientation Level: Oriented X4 Cognition Arousal/Alertness: Awake/alert Behavior During Therapy: WFL for tasks assessed/performed Overall Cognitive Status: Impaired/Different from baseline Area of Impairment: Safety/judgement, Awareness, Following commands, Problem solving Following Commands: Follows one step commands inconsistently, Follows one step commands with increased time Safety/Judgement: Decreased awareness of safety, Decreased awareness of deficits Awareness: Emergent Problem Solving: Slow processing, Decreased initiation, Difficulty sequencing, Requires verbal cues General Comments: pt oriented, but asking to hold on therapy until after he got his strength back. pt needing cues to complete all sequencing for bed mobility and movements. once sitting EOB, pt with decrased command following and decreased awareness of positioning.   Blood pressure (!) 99/55, pulse 65, temperature 98.4 F (36.9 C), temperature source Oral, resp. rate 13, height 5\' 8"  (1.727 m), weight 104.5 kg, SpO2 99 %. Physical Exam Vitals reviewed.  Constitutional:      Appearance: He is obese.  HENT:     Head: Normocephalic and atraumatic.     Right Ear: External ear normal.     Left Ear: External ear normal.     Nose: Nose normal.  Eyes:     General:        Right eye: No discharge.        Left eye: No discharge.     Extraocular Movements: Extraocular movements intact.  Cardiovascular:     Rate and Rhythm: Normal rate and regular rhythm.  Pulmonary:     Effort: Pulmonary effort is normal. No respiratory distress.     Breath sounds: No stridor.     Comments: +Fate Abdominal:     General: There is no distension.      Palpations: Abdomen is soft.  Musculoskeletal:     Cervical back: Normal range of motion and neck supple.     Comments: B/l BKA with mild tenderness, mild edema  Skin:    General: Skin is warm and dry.  Neurological:     Mental Status: He is alert.     Comments: Alert Dysarthria Motor: Grossly 4/5, left weaker than right  Psychiatric:        Mood and Affect: Mood normal.        Behavior: Behavior normal.    Results for orders placed or performed during the hospital encounter of 01/11/21 (from the past 48 hour(s))  Glucose, capillary     Status: Abnormal   Collection Time: 01/23/21  8:49 PM  Result Value Ref Range   Glucose-Capillary 208 (H) 70 - 99 mg/dL    Comment: Glucose reference range applies only to samples taken after  fasting for at least 8 hours.  Comprehensive metabolic panel     Status: Abnormal   Collection Time: 01/24/21  1:51 AM  Result Value Ref Range   Sodium 130 (L) 135 - 145 mmol/L   Potassium 4.4 3.5 - 5.1 mmol/L   Chloride 94 (L) 98 - 111 mmol/L   CO2 24 22 - 32 mmol/L   Glucose, Bld 232 (H) 70 - 99 mg/dL    Comment: Glucose reference range applies only to samples taken after fasting for at least 8 hours.   BUN 53 (H) 8 - 23 mg/dL   Creatinine, Ser 6.39 (H) 0.61 - 1.24 mg/dL   Calcium 8.1 (L) 8.9 - 10.3 mg/dL   Total Protein 5.6 (L) 6.5 - 8.1 g/dL   Albumin 2.6 (L) 3.5 - 5.0 g/dL   AST 70 (H) 15 - 41 U/L   ALT 20 0 - 44 U/L   Alkaline Phosphatase 82 38 - 126 U/L   Total Bilirubin 2.3 (H) 0.3 - 1.2 mg/dL   GFR, Estimated 9 (L) >60 mL/min    Comment: (NOTE) Calculated using the CKD-EPI Creatinine Equation (2021)    Anion gap 12 5 - 15    Comment: Performed at [facility], [address], [place], [place] [address]  CBC     Status: Abnormal   Collection Time: 01/24/21  1:51 AM  Result Value Ref Range   WBC 12.5 (H) 4.0 - 10.5 K/uL   RBC 2.77 (L) 4.22 - 5.81 MIL/uL   Hemoglobin 8.0 (L) 13.0 - 17.0 g/dL   HCT 25.4 (L) 39.0 - 52.0 %   MCV 91.7  80.0 - 100.0 fL   MCH 28.9 26.0 - 34.0 pg   MCHC 31.5 30.0 - 36.0 g/dL   RDW 17.5 (H) 11.5 - 15.5 %   Platelets 155 150 - 400 K/uL   nRBC 0.8 (H) 0.0 - 0.2 %    Comment: Performed at [facility] [address] [place], [place] [address]  Glucose, capillary     Status: Abnormal   Collection Time: 01/24/21  6:30 AM  Result Value Ref Range   Glucose-Capillary 244 (H) 70 - 99 mg/dL    Comment: Glucose reference range applies only to samples taken after fasting for at least 8 hours.  Glucose, capillary     Status: Abnormal   Collection Time: 01/24/21 12:46 PM  Result Value Ref Range   Glucose-Capillary 138 (H) 70 - 99 mg/dL    Comment: Glucose reference range applies only to samples taken after fasting for at least 8 hours.  Glucose, capillary     Status: Abnormal   Collection Time: 01/24/21  3:41 PM  Result Value Ref Range   Glucose-Capillary 157 (H) 70 - 99 mg/dL    Comment: Glucose reference range applies only to samples taken after fasting for at least 8 hours.  Glucose, capillary     Status: Abnormal   Collection Time: 01/24/21  6:03 PM  Result Value Ref Range   Glucose-Capillary 186 (H) 70 - 99 mg/dL    Comment: Glucose reference range applies only to samples taken after fasting for at least 8 hours.  Glucose, capillary     Status: Abnormal   Collection Time: 01/24/21  8:54 PM  Result Value Ref Range   Glucose-Capillary 140 (H) 70 - 99 mg/dL    Comment: Glucose reference range applies only to samples taken after fasting for at least 8 hours.  CBC  Status: Abnormal   Collection Time: 01/25/21  1:49 AM  Result Value Ref Range   WBC 12.5 (H) 4.0 - 10.5 K/uL   RBC 2.59 (L) 4.22 - 5.81 MIL/uL   Hemoglobin 7.6 (L) 13.0 - 17.0 g/dL   HCT 24.0 (L) 39.0 - 52.0 %   MCV 92.7 80.0 - 100.0 fL   MCH 29.3 26.0 - 34.0 pg   MCHC 31.7 30.0 - 36.0 g/dL   RDW 18.0 (H) 11.5 - 15.5 %   Platelets 165 150 - 400 K/uL   nRBC 0.7 (H) 0.0 - 0.2 %    Comment: Performed at [facility] [address] [place], [place] [address]  Basic metabolic panel     Status: Abnormal   Collection Time: 01/25/21  1:49 AM  Result Value Ref Range   Sodium 133 (L) 135 - 145 mmol/L   Potassium 3.8 3.5 - 5.1 mmol/L   Chloride 96 (L) 98 - 111 mmol/L   CO2 26 22 - 32 mmol/L   Glucose, Bld 168 (H) 70 - 99 mg/dL    Comment: Glucose reference range applies only to samples taken after fasting for at least 8 hours.   BUN 31 (H) 8 - 23 mg/dL   Creatinine, Ser 4.56 (H) 0.61 - 1.24 mg/dL   Calcium 8.3 (L) 8.9 - 10.3 mg/dL   GFR, Estimated 13 (L) >60 mL/min    Comment: (NOTE) Calculated using the CKD-EPI Creatinine Equation (2021)    Anion gap 11 5 - 15    Comment: Performed at [facility] [address], [place], [place] [address]  Glucose, capillary     Status: Abnormal   Collection Time: 01/25/21  7:13 AM  Result Value Ref Range   Glucose-Capillary 140 (H) 70 - 99 mg/dL    Comment: Glucose reference range applies only to samples taken after fasting for at least 8 hours.  Glucose, capillary     Status: Abnormal   Collection Time: 01/25/21 11:03 AM  Result Value Ref Range   Glucose-Capillary 180 (H) 70 - 99 mg/dL    Comment: Glucose reference range applies only to samples taken after fasting for at least 8 hours.   EEG adult  Result Date: 01/23/2021 Lora Havens, MD     01/23/2021  5:33 PM Patient Name: [name] MRN: [id number] Epilepsy Attending: Lora Havens Referring Physician/Provider: Dr Kerney Elbe Date: 01/23/2021 Duration: 22.53 mins Patient history: 70yo M with ams, tongue bite. EEG to evaluate for seizure. Level of alertness: Awake, asleep AEDs during EEG study: GBP Technical aspects: This EEG study was done with scalp electrodes positioned according to the 10-20 International system of electrode placement. Electrical activity was acquired at a sampling rate of 500Hz  and reviewed with a high frequency filter of 70Hz  and a low frequency filter of 1Hz .  EEG data were recorded continuously and digitally stored. Description: The posterior dominant rhythm consists of 8 Hz activity of moderate voltage (25-35 uV) seen predominantly in posterior head regions, symmetric and reactive to eye opening and eye closing. Sleep was characterized by vertex waves, sleep spindles (12 to 14 Hz), maximal frontocentral region. EEG showed intermittent generalized 3 to 6 Hz theta-delta slowing. Hyperventilation and photic stimulation were not performed.   ABNORMALITY - Intermittent slow, generalized IMPRESSION: This study is suggestive of mild diffuse encephalopathy, nonspecific etiology. No seizures or epileptiform discharges were seen throughout the recording. Lora Havens       Medical Problem List  and Plan: 1.  Deficits with mobility, self-care, transfers, endurance secondary to debility.  -patient may not shower  -ELOS/Goals: 10-14 days/Min A  Admit to CIR 2.  Antithrombotics: -DVT/anticoagulation:  Mechanical: Sequential compression devices, below knee Bilateral lower extremities  -antiplatelet therapy: DAPT X 3 weeks followed by ASA alone.  3. Pain Management: Tylenol prn.  4. Mood: LCSW to follow for evaluation and support.   -antipsychotic agents: N/A 5. Neuropsych: This patient is ?fully capable of making decisions on his own behalf. 6. Skin/Wound Care: Routine pressure relief measures.  7. Fluids/Electrolytes/Nutrition: Strict I/O. 1200 cc FR. Renal diet' 8. New ESRD: On HD TTS --schedule at the end of the day to help with tolerance of therapy  --Daily weight 9. Demand ischemia/NSVT: On amiodarone daily 10. Thrombocytopenia: Platelets recovering from drop to 76-->108 CBC ordered for tomorrow 11. Anemia of chronic disease: ON Aranesp weekly for supplement.   CBC ordered for tomorrow 12. T2DM with hyperglycemia: Hgb  A1C-6.8.  Monitor BS ac/hs.  --Continue SSI as well as 4 units novolog TID with meals Monitor with increased mobility 13. Small  acute left pontine infarct  See #2 14. S/p b/l BKA  Continue to monitor  Bary Leriche, PA-C 01/25/2021  I have personally performed a face to face diagnostic evaluation, including, but not limited to relevant history and physical exam findings, of this patient and developed relevant assessment and plan.  Additionally, I have reviewed and concur with the physician assistant's documentation above.  Delice Lesch, MD, ABPMR

## 2021-01-23 NOTE — Progress Notes (Signed)
HD#11 SUBJECTIVE:  Patient Summary: [name] is a 70 y.o. with a pertinent PMH of HTN, T2DM, PAD s/p bilateral BKAs, CAD s/p CABG, HFrEF, and ESRD on HD who presented with bilateral lower extremity swelling and admitted for acute on chronic HFrEF complicated by CKD V progression to ESRD on HD.  Overnight Events: No acute events overnight  Interim History: This is hospital day 11 for [name] who was seen and evaluated at the bedside. He feels well overall and is eager to get home. Denies any SOB or chest pain. Felt tired during dialysis, but improved afterwards.  NOTE: Patient was medically stable to be discharged, however, at 1830, noted to have episode of vomiting and unresponsiveness. See A/P below.   OBJECTIVE:  Vital Signs: Vitals:   01/23/21 0403 01/23/21 0425 01/23/21 0639 01/23/21 1037  BP:  (!) 112/50  (!) 111/50  Pulse:  65  69  Resp:  (!) 21  12  Temp:  100.1 F (37.8 C)  99.3 F (37.4 C)  TempSrc:  Oral  Oral  SpO2: 99% 97% 97% 98%  Weight:  102.5 kg    Height:       Supplemental O2: Room Air SpO2: 98 % O2 Flow Rate (L/min): 2 L/min  Filed Weights   01/22/21 1200 01/22/21 1957 01/23/21 0425  Weight: 100 kg 101.2 kg 102.5 kg     Intake/Output Summary (Last 24 hours) at 01/23/2021 1137 Last data filed at 01/23/2021 0300 Gross per 24 hour  Intake 379.85 ml  Output 1797 ml  Net -1417.15 ml   Net IO Since Admission: -554.54 mL [01/23/21 1137]  Physical Exam: General: No acute distress. Neck: R tunneled cath remains in place, dressing clean/dry/intact CV: RRR. No murmurs, rubs, or gallops. No LE edema Pulmonary: Lungs CTAB. Normal effort. Abdominal: Soft, nontender, nondistended. Normal bowel sounds. Extremities: Palpable radial pulses. S/p bilateral BKA. LUE AV graft edema is improved Skin: Warm and dry. Edematous LUE surrounding AV graft (improved), no erythema.  Neuro: A&Ox3. No focal deficit. Psych: Normal mood and affect      ASSESSMENT/PLAN:  Assessment: Principal Problem:   ESRD needing dialysis ([place]) Active Problems:   Goals of care, counseling/discussion   Acute on chronic congestive heart failure (HCC)   Ventricular tachycardia, non-sustained   Benign prostatic hyperplasia   Seizure ([place])   Plan: #ESRD on HD #S/p LUE AV graft placement Patient with CKD V that has progressed to ESRD on HD. Left hand remains edematous after LUE AV graft placement, although it appears to be improving. Will require outpatient follow up with vascular regarding this. Patient is set up for TTS dialysis as an outpatient. Patient was medically stable for discharge and denied CIR and wished to go home. Patient was discharged to home, however, family came to visit and had concerns with the patient going home 2/2 to the patient's wife not being able to provide the support that this patient requires. Additionally, around 1830, patient had a short code blue called, after he was vomiting and became unresponsive temporarily (although never lost a pulse). Patient was not discharged and was transferred to [place].  - Nephrology consulted, appreciate recs - Hold hydralazine in the setting of hypotension with dialysis - Continue to monitor renal function panel - Consider arterial doppler of LUE if edema worsens - D/c flomax, as patient no longer produces urine   #Type 2 diabetes Patient has been tolerating more po intake, and CBGs have been >250, despite SSI. A1c  noted to be 6.8, however, likely falsely low in the setting of ESRD on HD.  - Novolog 4u tid with meals + SSI - GLP1 on discharge   #Acute on chronic HFrEF (EF 30-35%) Patient presented initially with volume overload with SOB and lower extremity edema, secondary to HFrEF exacerbation complicated by ESRD. Patient remains euvolemic on exam today and is now able to wear his prostheses again.  - Continue metoprolol, amio, and imdur, holding if hypotensive - Hold hydralazine as  above   #Elevated LFTs #Congestive hepatopathy  LFTs have been down trending, will check every other day.  - Continue to monitor LFTs   Best Practice: Diet: Renal diet IVF: Fluids: none VTE: Held Code: Full AB: none Therapy Recs: CIR, DME: hoyer lift, sliding board DISPO: Anticipated discharge to Rehab pending Medical stability.  Signature: Buddy Duty, D.O.  Internal Medicine Resident, PGY-1 [facility]  Pager: [phone] 11:37 AM, 01/23/2021   Please contact the on call pager after 5 pm and on weekends at [phone].

## 2021-01-23 NOTE — Progress Notes (Signed)
Inpatient Rehab Admissions Coordinator:    I do not have a bed for this Pt. On CIR. Note Pt. Initially refused CIR with plans to go home, but then d/c was cancelled. Shortly after, code was called and Pt. Now with small acute pontine CVA. I await therapy notes to determine if he remains appropriate for CIR, but if so I will pursue admit once stroke work up is complete (pending bed availability).  Clemens Catholic, [place], [place] Admissions Coordinator  [phone] ([place]) [phone] (office)

## 2021-01-23 NOTE — Progress Notes (Signed)
EEG complete - results pending 

## 2021-01-23 NOTE — Progress Notes (Addendum)
STROKE TEAM PROGRESS NOTE   SUBJECTIVE (INTERVAL HISTORY) No family is at the bedside.  Patient is resting comfortably in bed after returning from MRI.  OBJECTIVE Vitals:   01/23/21 0425 01/23/21 0639 01/23/21 1037 01/23/21 1242  BP: (!) 112/50  (!) 111/50 (!) 107/52  Pulse: 65  69 66  Resp: (!) 21  12 12   Temp: 100.1 F (37.8 C)  99.3 F (37.4 C) 99.1 F (37.3 C)  TempSrc: Oral  Oral Oral  SpO2: 97% 97% 98% 98%  Weight: 102.5 kg     Height:        CBC:  Recent Labs  Lab 01/22/21 1910 01/23/21 0054  WBC 10.6* 11.5*  NEUTROABS 6.5 9.1*  HGB 8.6* 8.7*  HCT 26.0* 27.1*  MCV 91.5 90.9  PLT 107* 108*    Basic Metabolic Panel:  Recent Labs  Lab 01/17/21 0735 01/18/21 0432 01/22/21 1854 01/22/21 1902 01/23/21 0054  NA 128*   < > 132* 134* 134*  K 3.6   < > 3.8 3.5 4.1  CL 94*   < > 95*  --  95*  CO2 22   < > 23  --  23  GLUCOSE 162*   < > 153*  --  87  BUN 56*   < > 34*  --  39*  CREATININE 6.56*   < > 4.56*  --  4.96*  CALCIUM 8.0*   < > 8.1*  --  8.3*  MG  --   --  2.2  --   --   PHOS 4.2  --   --   --  3.9   < > = values in this interval not displayed.    Lipid Panel: No results for input(s): CHOL, TRIG, HDL, CHOLHDL, VLDL, LDLCALC in the last 168 hours. HgbA1c:  Lab Results  Component Value Date   HGBA1C 6.8 (H) 01/12/2021   Urine Drug Screen:     Component Value Date/Time   LABOPIA NONE DETECTED 08/18/2018 1632   COCAINSCRNUR NONE DETECTED 08/18/2018 1632   LABBENZ NONE DETECTED 08/18/2018 1632   AMPHETMU NONE DETECTED 08/18/2018 1632   THCU NONE DETECTED 08/18/2018 1632   LABBARB NONE DETECTED 08/18/2018 1632    Alcohol Level No results found for: [facility]  IMAGING  Results for orders placed or performed during the hospital encounter of 01/11/21  MR BRAIN WO CONTRAST   Narrative   CLINICAL DATA:  Seizure  EXAM: MRI HEAD WITHOUT CONTRAST  TECHNIQUE: Multiplanar, multiecho pulse sequences of the brain and surrounding structures were  obtained without intravenous contrast.  COMPARISON:  04/28/2006  FINDINGS: Brain: Small acute infarct of the left pons. Punctate focus of acute ischemia in the right corona radiata. No acute or chronic hemorrhage. There is multifocal hyperintense T2-weighted signal within the white matter. Generalized volume loss without a clear lobar predilection. 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 The midline structures are normal.  Vascular: Major flow voids are preserved.  Skull and upper cervical spine: Normal calvarium and skull base. Visualized upper cervical spine and soft tissues are normal.  Sinuses/Orbits:No paranasal sinus fluid levels or advanced mucosal thickening. No mastoid or middle ear effusion. Normal orbits.  IMPRESSION: 1. Small acute infarct of the left pons. No hemorrhage or mass effect. 2. Punctate focus of acute ischemia in the right corona radiata. 3. 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   Electronically Signed   By: Ulyses Jarred M.D.   On: 01/23/2021 00:30   MR  ANGIO HEAD WO CONTRAST   Narrative   CLINICAL DATA:  Stroke, follow up  EXAM: MRA HEAD WITHOUT CONTRAST  TECHNIQUE: Angiographic images of the Circle of Willis were acquired using MRA technique without intravenous contrast.  COMPARISON:  Same day MRI head.  MRA April 28, 2006.  FINDINGS: Anterior circulation: Motion limited evaluation of the intracranial ICAs, with potentially moderate right and severe left paraclinoid ICA stenosis. Bilateral MCAs and ACAs are patent. Hypoplastic left A1 ACA. Small (2 mm) anteriorly directed outpouching arising from the right paraclinoid ICA (series 3, image 81).  Posterior circulation: Right dominant intradural vertebral artery. Multifocal moderate stenosis of the small/non dominant intradural left vertebral artery. Bilateral intradural vertebral arteries, basilar artery, and posterior cerebral arteries are patent  without proximal hemodynamically significant stenosis. Right larger than left posterior communicating arteries with smaller absent right P1 PCA, anatomic variant.  Anatomic variants: Detailed above.  IMPRESSION: 1. No large vessel occlusion. 2. Motion limited evaluation of the intracranial ICAs with potentially severe left and moderate right paraclinoid ICA stenosis. A CTA could confirm and further characterize if clinically indicated. 3. Multifocal moderate stenosis of the small/non dominant left intradural vertebral artery. 4. Small (2 mm) anteriorly directed outpouching arising from the right paraclinoid ICA, compatible with aneurysm versus infundibulum.   Electronically Signed   By: Margaretha Sheffield M.D.   On: 01/23/2021 12:56   Results for orders placed or performed during the hospital encounter of 07/27/12  CT Head Wo Contrast   Narrative   *RADIOLOGY REPORT*  Clinical Data:  Fall.  Head and neck injury and pain.  CT HEAD WITHOUT CONTRAST CT CERVICAL SPINE WITHOUT CONTRAST  Technique:  Multidetector CT imaging of the head and cervical spine was performed following the standard protocol without intravenous contrast.  Multiplanar CT image reconstructions of the cervical spine were also generated.  Comparison:  Head CT on 04/27/2006  CT HEAD  Findings: There is no evidence of intracranial hemorrhage, brain edema or other signs of acute infarction.  There is no evidence of intracranial mass lesion or mass effect.  No abnormal extra-axial fluid collections are identified.  Ventricles remain normal in size.  Mild chronic small vessel disease is again seen.  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  No evidence of skull fracture or other bone lesion.  IMPRESSION:  1.  No acute intracranial findings. 2.  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  CT  CERVICAL SPINE  Findings: No evidence of cervical spine fracture or subluxation. Degenerative disc disease is seen which is most pronounced at C6-7, and to lesser degrees at C4-5 and C5-6.  Mild facet DJD noted bilaterally as well as mild atlantoaxial degenerative changes.  No other significant bone abnormality identified.  IMPRESSION:  1.  No evidence of acute cervical spine fracture or subluxation. 2.  Degenerative spondylosis as described.   Original Report Authenticated By: Earle Gell, M.D.   Results for orders placed or performed in visit on 04/28/06  MR Brain W Wo Contrast   Narrative   Clinical Data: Right facial droop, slurred speech. Abnormal CT.  MRI BRAIN WITHOUT AND WITH CONTRAST:  Technique: Multiplanar and multiecho pulse sequences of the brain and surrounding structures were obtained according to standard protocol before and after administration of intravenous contrast.  Contrast: 20 mL IV Magnevist.  Comparison: Comparison is made with CT 04/27/06.  Findings: Diffusion weighted imaging is positive in  the right basal ganglia involving the globus pallidus and putamen. This area measures approximately 2 x 3 cm and is compatible with an acute infarct. No cortical infarct is identified. Mild white matter changes are noted compatible with chronic white matter ischemia. There is no hemorrhage. The enhancement pattern shows mild enhancement of the acute infarct. No mass lesion is identified.   There is mild chronic sinusitis.   IMPRESSION:   1. Acute infarct in the right basal ganglia involving the globus pallidus and putamen.   2. Chronic sinusitis.   MR ANGIOGRAPHY OF HEAD:  Technique: 3-D time of flight pulse sequence was performed to examine the cerebral vasculature, centered at the circle of Willis, without IV contrast. Multiplanar MR image reconstructions were generated to evaluate the vascular anatomy.  Comparison: None.  Findings: Normal intracranial circulation. No  significant stenosis is identified. There is no large vessel occlusion. Negative for aneurysm. There is fetal origin of the posterior cerebral arteries bilaterally with hypoplastic P1 segments bilaterally, especially on the right.   IMPRESSION:   No significant intracranial stenosis.    Provider: Henrietta Dine, Lonie Peak Foster     PHYSICAL EXAM  Physical Exam  Constitutional: Appears well-developed and well-nourished.  Psych: Affect appropriate to situation Eyes: No scleral injection HENT: No OP obstrucion MSK: Bilateral BKA Cardiovascular: Normal rate and regular rhythm.  Respiratory: Effort normal, non-labored breathing GI: Soft.  No distension. There is no tenderness.  Skin: WDI  Neuro: Mental Status: Patient is awake, alert, oriented to person, place, month, year, and situation. Patient is able to give a clear and coherent history. No signs of aphasia or neglect Cranial Nerves: II: Visual Fields are full. Pupils are equal, round, and reactive to light.   III,IV, VI: EOMI without ptosis or diploplia.  V: Facial sensation is symmetric to temperature VII: Facial movement is symmetric resting and smiling VIII: Hearing is intact to voice X: Palate elevates symmetrically XI: Shoulder shrug is symmetric. XII: Tongue protrudes midline without atrophy or fasciculations.  Motor: Tone is normal. Bulk is normal. 5/5 strength is present in bilateral upper extremities  Sensory: Sensation is symmetric to light touch in upper extremities and bilateral thighs  Cerebellar: FTN intact bilateral  Gait not tested   ASSESSMENT/PLAN Mr. ZAVION SLEIGHT is a 70 y.o. male with history of ESRD on HD, DM2, PAD s/p BKA, CAD s/p CABG, and HFrEF who initially presented with bilateral lower extremity selling and admitted for ESRD on HD. Overnight (11/17) patient was actively vomiting and became unresponsive. He was noted by RN to be biting down on his tongue which seemed pale. Seizure was  suspected. Duration was approximately 3 minutes. He was noted to be incontinent of stool and urine. The patient's pulse was palpable throughout the event. He was initially confused after the spell, then relatively quickly returned to his cognitive baseline.     Stroke: small acute infarct to the left pons, punctate focus of acute ischemia in the right CR secondary to a suspected small/large vessel disease source, but can not completely rule out cardioembolic source at this time.  MRI head: small acute infarct of the left pons, a punctate focus of acute ischemia in the right corona radiata and old left cerebellar and right basal ganglia infarcts MRA head: (Motion limited) evaluation of the intracranial ICAs with potentially severe left and moderate right paraclinoid ICA stenosis. Multifocal moderate stenosis of the small/non dominant left intradural VA.  Carotid Doppler: LICA- 45-80%, right vertebral artery demonstrates antegrade flow,  left vertebral artery demonstrates bidirectional flow. Right subclavian artery was not visualized. Left subclavian artery was stenotic.  2D Echo - EF 30-35% EEG no seizure Recommend 30 day cardiac event monitoring to rule out afib as outpt LDL 94 HgbA1c 6.8 SCDs for VTE prophylaxis aspirin 81 mg daily prior to admission, now on ASA 81 and  clopidogrel 75 mg daily for 3 weeks and then plavix alone Patient counseled to be compliant with his antithrombotic medications Ongoing aggressive stroke risk factor management Therapy recommendations:  inpatient rehab- d/c on 01/25/2021 Disposition:  pending  Hx of hypertension hypotension Fluctuate but more on the low end Avoid low BP Long-term BP goal normotensive Home meds: isosorbide mononitrate 30mg , hydralazine 25mg  TID, Metoprolol succinate 50mg   Hyperlipidemia Home meds:  Lipitor 40mg  daily LDL 94, goal < 70 Increase lipitor from 40 to 80mg  Continue statin at discharge  Diabetes type II HgbA1c 6.8, goal <  7.0 Blood glucose Controlled SSI CBG monitoring Close outpt PCP follow up  Other Stroke Risk Factors Advanced age Former Cigarette smoker Hx of stroke w/ memory loss and left sided weakness CHF Obesity, Body mass index is 34.36 kg/m., recommend weight loss, diet and exercise as appropriate   Other Active Problems B/l BKA Possible Seizure Activity EEG neg No AED needed at this time Elevated troponin Peak 340 ESRD HD managed by nephrology Fistula placed   Hospital day # 12  Pt seen by NP/Neuro and later by MD. Note/plan to be edited by MD as needed.  Janine Ores, FNP-BC [facility]  ATTENDING NOTE: I reviewed above note and agree with the assessment and plan. Pt was seen and examined.   70 year old male with history of ESRD on hemodialysis, diabetes, PVD with bilateral BKA, CHF, hypertension, hyperlipidemia, CAD status post CABG, stroke admitted for bilateral lower extremity edema and shortness of breath.  Had a dialysis and AV fistula placement 11/14.  Patient condition treated and improved, planning for CIR.  Last night had episode of unresponsiveness after vomiting, tongue biting and bowel bladder incontinence followed by confusion and then back to baseline.  Symptoms concerning for seizure.  However EEG negative, MRI showed left pontine infarct and right CR punctate infarct.  Old left cerebellum and right BG infarcts.  MRA showed multifocal vascular stenosis, including b/l siphon L>R.  Carotid Doppler left ICA 40 to 59% stenosis.  EF 30 to 35%, LDL 94 A1c 6.8.  Creatinine 4.96.  WBC 11.5, hemoglobin 8.7, platelet 108.  BP on the lower end.  On neuro examination, patient lying in bed, no family at bedside.  Awake alert, orientated x3, no aphasia, follows simple commands.  Neuro exam no focal deficit.  Etiology for patient's strokes likely due to small versus large vessel stenosis, given the location and multivessel stenosis.  However, given to involvement of  different vascular territories, will recommend 30-day cardiac event monitor as outpatient to rule out A. fib.  Follow-up with cardiology for cardiomyopathy with EF 30 to 35%.  Recommend aspirin 81 and Plavix 75 DAPT for 3 weeks and then Plavix alone.  Increase Lipitor 40 to 80.  Avoid low BP, aggressive risk factor modification.  PT/OT recommend CIR.  For detailed assessment and plan, please refer to above as I have made changes wherever appropriate.   Neurology will sign off. Please call with questions. Pt will follow up with stroke clinic NP at [facility] in about 4 weeks. Thanks for the consult.   Rosalin Hawking, MD PhD Stroke Neurology 01/23/2021 6:35 PM  To contact Stroke Continuity provider, please refer to [email or web address] After hours, contact General Neurology

## 2021-01-23 NOTE — Progress Notes (Addendum)
HD#12 SUBJECTIVE:  Patient Summary: [name] is a 70 y.o. with a pertinent PMH of HTN, T2DM, PAD s/p bilateral BKAs, CAD s/p CABG, HFrEF, and ESRD on HD who presented with bilateral lower extremity swelling and admitted for ESRD on HD and now, acute CVA.   Overnight Events: Overnight, patient was actively vomiting and became unresponsive. Noted to have seizure like activity and patient became incontinent of his stool. Code blue was called, although patient did not lose a pulse and chest compressions were not performed. Respiratory arrest thought to be 2/2 to seizure like activity and patient spontaneously initiated breathing after a couple minutes. Neuro was consulted for seizure like activity.   Interim History: This is hospital day 12 for [name] who was seen and evaluated at the bedside this morning. He feels weak since last night, although denies any one area of weakness greater than the other. His breathing feels fine, and overall, he has no acute complaints today except that he would now like to go to rehab, as opposed to going home.    OBJECTIVE:  Vital Signs: Vitals:   01/23/21 0403 01/23/21 0425 01/23/21 0639 01/23/21 1037  BP:  (!) 112/50  (!) 111/50  Pulse:  65  69  Resp:  (!) 21  12  Temp:  100.1 F (37.8 C)  99.3 F (37.4 C)  TempSrc:  Oral  Oral  SpO2: 99% 97% 97% 98%  Weight:  102.5 kg    Height:       Supplemental O2: Nasal Cannula SpO2: 98 % O2 Flow Rate (L/min): 2 L/min  Filed Weights   01/22/21 1200 01/22/21 1957 01/23/21 0425  Weight: 100 kg 101.2 kg 102.5 kg     Intake/Output Summary (Last 24 hours) at 01/23/2021 1111 Last data filed at 01/23/2021 0300 Gross per 24 hour  Intake 379.85 ml  Output 1797 ml  Net -1417.15 ml   Net IO Since Admission: -554.54 mL [01/23/21 1111]  Physical Exam: General: No acute distress. Neck: R tunneled cath CV: Bradycardic rate and regular rhythm. No murmurs, rubs, or gallops. No LE edema Pulmonary:  Lungs CTAB. Normal effort. Abdominal: Soft, nontender, nondistended. Normal bowel sounds. Extremities: Palpable radial pulses. S/p bilateral BKA. LUE AV graft in place Skin: Warm and dry. No obvious rash or lesions. Neuro: A&Ox3. Moves all extremities. Normal sensation. 5/5 strength bilateral upper and lower extremities. CN II-XII intact.  Psych: Normal mood and affect    ASSESSMENT/PLAN:  Assessment: Principal Problem:   ESRD needing dialysis ([place]) Active Problems:   Goals of care, counseling/discussion   Acute on chronic congestive heart failure (HCC)   Ventricular tachycardia, non-sustained   Benign prostatic hyperplasia   Seizure ([place])   Plan: #CVA  #Possible seizure Patient had a short code blue called on him yesterday, as he vomited and became unresponsive, although he never lost a pulse. Possibly had seizure like activity, and was incontinent of his stool. CPR was not performed, and the patient become responsive after about 3 minutes. His respiratory arrest was thought to be secondary to seizure like activity. Neurology was consulted. Also noted that the patient had a small acute infarct in his left pons and a focus of acute ischemia in the right corona radiata, with multiple old infarcts noted, as well. Patient has not been noted to be in Afib, however, we are unable to see telemetry from yesterday during the episode. 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 Left  ICA 40-59% stenosis and right ICA with 1-39% stenosis. - Neurology consulted, appreciate recs - MRA head pending - EEG pending - Seizure precautions - No need to repeat echo, last echo was 11/16 - Cardiac monitor on discharge per Dr. Terrence Dupont (patient's cardiologist) - Continue asa, plavix, and statin  - Cardiac tele  - PT/OT/SLP eval and treat - Requesting CIR to reevaluate patient   #ESRD on HD #S/p LUE AV graft placement Patient with CKD V that has  progressed to ESRD on HD. He is s/p left upper extremity AV graft placement, edema has been improving. Will require outpatient follow up with vascular regarding this.Patient is set up for TTS dialysis as an outpatient.  - Reconsulted nephro for inpatient dialysis - Hold hydral and imdur in setting of hypotension - Continue to monitor renal function  #Acute on chronic HFrEF (EF 30-35%) Patient presented initially with volume overload with SOB and lower extremity edema, secondary to HFrEF exacerbation complicated by ESRD. Patient remains euvolemic on exam. - Holding metoprolol, imdur, hydral in setting of bradycardia and hypotension  #Sustained Vtach  On hospital day 1, patient had EKG with wide complex tachycardia, concerning for sustained vtach likely in the setting of electrolyte abnormalities and metabolic acidosis. Patient denied chest pain, however, rapid response, cardio, and PCCM were called. Patient was started on amiodarone infusion, and remains on oral amiodarone. Amio has been held in the setting of bradycardia, however, discussed with Dr. Terrence Dupont (cardiology) and will reduce dose and give holding parameters - Amiodarone 200 mg daily (previously bid dosing) - Hold amio if HR <55 - Cardiac monitor on discharge  Best Practice: Diet: Renal diet IVF: Fluids: none VTE: Held Code: Full AB: None Therapy Recs: CIR DISPO: Anticipated discharge to  CIR  pending Medical stability.  Signature: Buddy Duty, D.O.  Internal Medicine Resident, PGY-1 [facility]  Pager: [phone] 11:11 AM, 01/23/2021   Please contact the on call pager after 5 pm and on weekends at [phone].

## 2021-01-23 NOTE — Progress Notes (Signed)
Unavailable for EEG at this time. Pt will be going to MRI. Will attempt later when schedule permits

## 2021-01-23 NOTE — Evaluation (Signed)
Clinical/Bedside Swallow Evaluation Patient Details  Name: [name] MRN: [id number] Date of Birth: Mar 11, 1950  Today's Date: 01/23/2021 Time: SLP Start Time (ACUTE ONLY): 57 SLP Stop Time (ACUTE ONLY): 9562 SLP Time Calculation (min) (ACUTE ONLY): 14 min  Past Medical History:  Past Medical History:  Diagnosis Date   CHF (congestive heart failure) (HCC)    Chronic Kidney Disease IV    Exertional shortness of breath    "before my last OR; I'm fine now" (09/26/2012)   GERD (gastroesophageal reflux disease)    High cholesterol    Hx of seasonal allergies    Hypertension    Myocardial infarction ([place])    Scrotal edema 01/30/2019   Stroke ([place]) 2009   memory loss   Type II diabetes mellitus ([place])    Past Surgical History:  Past Surgical History:  Procedure Laterality Date   AMPUTATION Right 07/29/2012   Procedure: AMPUTATION 3RD TOE;  Surgeon: Kerin Salen, MD;  Location: [facility];  Service: Orthopedics;  Laterality: Right;  right third toe amputation   AMPUTATION Right 08/02/2012   Procedure: right transmetatarsal amputation;  Surgeon: Kerin Salen, MD;  Location: [facility];  Service: Orthopedics;  Laterality: Right;   AMPUTATION Right 09/06/2012   Procedure: AMPUTATION BELOW KNEE;  Surgeon: Kerin Salen, MD;  Location: [facility];  Service: Orthopedics;  Laterality: Right;   AMPUTATION Left 09/27/2012   Procedure: AMPUTATION BELOW KNEE;  Surgeon: Kerin Salen, MD;  Location: [facility];  Service: Orthopedics;  Laterality: Left;   AV FISTULA PLACEMENT Left 02-16-21   Procedure: ARTERIOVENOUS (AV) FISTULA OF LEFT ARM  USING GRAFT;  Surgeon: Angelia Mould, MD;  Location: [facility];  Service: Vascular;  Laterality: Left;   CATARACT EXTRACTION W/ INTRAOCULAR LENS  IMPLANT, BILATERAL  ?2010   CORONARY ARTERY BYPASS GRAFT  2008   CABG X4   ESOPHAGOGASTRODUODENOSCOPY N/A 10/08/2012   Procedure: ESOPHAGOGASTRODUODENOSCOPY (EGD);  Surgeon: Winfield Cunas., MD;  Location[facility] ENDOSCOPY;   Service: Endoscopy;  Laterality: N/A;   IR FLUORO GUIDE CV LINE RIGHT  01/14/2021   IR US GUIDE VASC ACCESS RIGHT  01/14/2021   PERIPHERALLY INSERTED CENTRAL CATHETER INSERTION Right 09/2012   upper arm   HPI:  Pt adm 11/6 with worsening BLE edema and SOB. Cardiac enzymes were elevated, thought to be due to demand ischemia. Diagnosed with new ESRD this admission. L AVG placed 02-17-2023. Coded 02/20/23: suspect possible seizure. MRI February 19, 2021 revealed small acute infarct in left pons and punctate acute ischemia in right corona radiata. EGD (10/08/2012) revealed normal esophagus. 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 [place] 01/23/21.    Assessment / Plan / Recommendation  Clinical Impression  Pt seen for bedside swallow eval this am, alert and repositioned upright in bed. RN reported pt OK for PO trials at this time and that he took pills whole in applesauce without difficulty. Oral mechanism examination significant for L facial droop (chronic per pt report to MD). He presents with upper dentures and reports misplacement of lower dentures. Thin liquids via consecutive straw sips (roughly 3oz or more) consumed without overt s/sx of aspiration. Primary difficulty noted with regular textured solids, with pt taking 1-2 minutes to masticate. Complete oral clearance of all solids appreciated via inspection. Given clinical presentation this  date, recommend initation of dys 2, thin liquid diet. SLP to f/u for tolerance and advancement if appropriate.  SLP Visit Diagnosis: Dysphagia, unspecified (R13.10)    Aspiration Risk  Mild aspiration risk    Diet Recommendation Dysphagia 2 (Fine chop);Thin liquid   Liquid Administration via: Straw;Cup Medication Administration: Whole meds with puree Supervision:  Intermittent supervision to cue for compensatory strategies;Staff to assist with self feeding Compensations: Minimize environmental distractions;Slow rate;Small sips/bites;Follow solids with liquid Postural Changes: Seated upright at 90 degrees;Remain upright for at least 30 minutes after po intake    Other  Recommendations Oral Care Recommendations: Oral care BID    Recommendations for follow up therapy are one component of a multi-disciplinary discharge planning process, led by the attending physician.  Recommendations may be updated based on patient status, additional functional criteria and insurance authorization.  Follow up Recommendations Other (comment) (TBD)      Assistance Recommended at Discharge Intermittent Supervision/Assistance  Functional Status Assessment Patient has had a recent decline in their functional status and demonstrates the ability to make significant improvements in function in a reasonable and predictable amount of time.  Frequency and Duration min 2x/week  2 weeks       Prognosis Prognosis for Safe Diet Advancement: Good Barriers to Reach Goals: Motivation      Swallow Study   General Date of Onset: 01/23/21 HPI: Pt adm 11/6 with worsening BLE edema and SOB. Cardiac enzymes were elevated, thought to be due to demand ischemia. Diagnosed with new ESRD this admission. L AVG placed 30-Jan-2023. Coded 02-Feb-2023: suspect possible seizure. MRI 2021/02/01 revealed small acute infarct in left pons and punctate acute ischemia in right corona radiata. EGD (10/08/2012) revealed normal esophagus. 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 [place] 01/23/21. Type of Study: Bedside Swallow Evaluation Previous Swallow Assessment: none per EMR Diet Prior to this Study:  NPO Temperature Spikes Noted: Yes (100.1) Respiratory Status: Nasal cannula History of Recent Intubation: No Behavior/Cognition: Alert;Cooperative Oral Cavity Assessment: Within Functional Limits Oral Care Completed by SLP: No Oral Cavity - Dentition: Dentures, top;Dentures, not available;Edentulous Vision: Functional for self-feeding Self-Feeding Abilities: Able to feed self Patient Positioning: Upright in bed;Postural control adequate for testing Baseline Vocal Quality: Normal Volitional Cough: Strong Volitional Swallow: Able to elicit    Oral/Motor/Sensory Function Overall Oral Motor/Sensory Function: Within functional limits   Ice Chips Ice chips: Within functional limits Presentation: Spoon   Thin Liquid Thin Liquid: Within functional limits Presentation: Straw;Self Fed    Nectar Thick Nectar Thick Liquid: Not tested   Honey Thick Honey Thick Liquid: Not tested   Puree Puree: Within functional limits Presentation: Spoon   Solid     Solid: Impaired Presentation: Self Fed Oral Phase Impairments: Impaired mastication Oral Phase Functional Implications: Impaired mastication     Ellwood Dense, [place], [place] Office Number: [phone]  Acie Fredrickson 01/23/2021,11:35 AM

## 2021-01-23 NOTE — Progress Notes (Signed)
Inpatient Rehab Admissions Coordinator:    I have a bed for this Pt. And will plan to admit him to CIR on Sunday 01/25/21. MD to enter d/c orders Sunday morning. RN may call report to [phone] after 12pm Sunday.  Clemens Catholic, [place], [place] Admissions Coordinator  [phone] ([place]) [phone] (office)

## 2021-01-23 NOTE — Progress Notes (Signed)
Carotid artery duplex completed. Refer to "CV Proc" under chart review to view preliminary results.  01/23/2021 10:07 AM Kelby Aline., MHA, RVT, RDCS, RDMS

## 2021-01-23 NOTE — Procedures (Signed)
Patient Name: [name]  MRN: [id number]  Epilepsy Attending: Lora Havens  Referring Physician/Provider: Dr Kerney Elbe Date: 01/23/2021 Duration: 22.53 mins  Patient history: 70yo M with ams, tongue bite. EEG to evaluate for seizure.  Level of alertness: Awake, asleep  AEDs during EEG study: GBP  Technical aspects: This EEG study was done with scalp electrodes positioned according to the 10-20 International system of electrode placement. Electrical activity was acquired at a sampling rate of 500Hz  and reviewed with a high frequency filter of 70Hz  and a low frequency filter of 1Hz . EEG data were recorded continuously and digitally stored.   Description: The posterior dominant rhythm consists of 8 Hz activity of moderate voltage (25-35 uV) seen predominantly in posterior head regions, symmetric and reactive to eye opening and eye closing. Sleep was characterized by vertex waves, sleep spindles (12 to 14 Hz), maximal frontocentral region. EEG showed intermittent generalized 3 to 6 Hz theta-delta slowing. Hyperventilation and photic stimulation were not performed.     ABNORMALITY - Intermittent slow, generalized  IMPRESSION: This study is suggestive of mild diffuse encephalopathy, nonspecific etiology. No seizures or epileptiform discharges were seen throughout the recording.  Anuja Manka Barbra Sarks

## 2021-01-23 NOTE — TOC Progression Note (Signed)
Transition of Care [facility]) - Progression Note    Patient Details  Name: [name] MRN: [id number] Date of Birth: 01-Jul-1950  Transition of [facility]) CM/SW Contact  Graves-Bigelow, Ocie Cornfield, RN Phone Number: 01/23/2021, 1:42 PM  Clinical Narrative:   Patient is now agreeable to CIR. Bed will be available on Sunday 01-25-21. Weekend Case Manager will follow the patient for additional transition of care needs.   Expected Discharge Plan: [place] Barriers to Discharge: Barriers Resolved  Expected Discharge Plan and Services Expected Discharge Plan: [place] In-house Referral: Hospice / Palliative Care Discharge Planning Services: CM Consult Post Acute Care Choice: [place] arrangements for the past 2 months: Single Family Home Expected Discharge Date: 01/22/21               DME Arranged: N/A DME Agency: NA       HH Arranged: NA HH Agency: NA     Readmission Risk Interventions Readmission Risk Prevention Plan 01/15/2021 02/02/2019  Transportation Screening Complete Complete  PCP or Specialist Appt within 5-7 Days - Complete  PCP or Specialist Appt within 3-5 Days Complete -  Home Care Screening - Complete  Medication Review (RN CM) - Complete  HRI or Home Care Consult Complete -  Social Work Consult for Recovery Care Planning/Counseling Complete -  Palliative Care Screening Complete -  Medication Review Press photographer) Complete -  Some recent data might be hidden

## 2021-01-23 NOTE — Progress Notes (Addendum)
Patient ID: [name], male   DOB: [birth year], 70 y.o.   MRN: [id number] S: Events of last night noted as well as MRI which revealed acute infarct of left pons, no mass or hemorrhage effect. O:BP (!) 111/50 (BP Location: Right Arm)   Pulse 69   Temp 99.3 F (37.4 C) (Oral)   Resp 12   Ht 5\' 8"  (1.727 m)   Wt 102.5 kg   SpO2 98%   BMI 34.36 kg/m   Intake/Output Summary (Last 24 hours) at 01/23/2021 1211 Last data filed at 01/23/2021 0300 Gross per 24 hour  Intake 379.85 ml  Output --  Net 379.85 ml   Intake/Output: I/O last 3 completed shifts: In: 599.9 [P.O.:577; I.V.:22.9] Out: 1797 [FGHWE:9937]  Intake/Output this shift:  No intake/output data recorded. Weight change: -4 kg Gen:NAD CVS: RRR Resp:CTA Abd: +BS, soft, TN/ND Ext: LUE AVG +T/B + edema, improving.   Recent Labs  Lab 01/17/21 0735 01/18/21 0432 01/19/21 0458 01/20/21 0356 01/22/21 0348 01/22/21 1854 01/22/21 1902 01/23/21 0054  NA 128* 130* 131* 129* 132* 132* 134* 134*  K 3.6 3.6 3.7 4.8 4.2 3.8 3.5 4.1  CL 94* 96* 97* 94* 97* 95*  --  95*  CO2 22 22 20* 20* 23 23  --  23  GLUCOSE 162* 128* 192* 299* 163* 153*  --  87  BUN 56* 41* 61* 77* 64* 34*  --  39*  CREATININE 6.56* 5.28* 6.70* 8.08* 7.08* 4.56*  --  4.96*  ALBUMIN 2.9* 2.7*  --  2.8* 2.5*  --   --  3.0*  CALCIUM 8.0* 7.9* 8.2* 8.2* 8.1* 8.1*  --  8.3*  PHOS 4.2  --   --   --   --   --   --  3.9  AST  --  755*  --  294* 103*  --   --   --   ALT  --  480*  --  172* 32  --   --   --    Liver Function Tests: Recent Labs  Lab 01/18/21 0432 01/20/21 0356 01/22/21 0348 01/23/21 0054  AST 755* 294* 103*  --   ALT 480* 172* 32  --   ALKPHOS 100 115 117  --   BILITOT 3.6* 3.1* 2.0*  --   PROT 5.8* 6.3* 5.8*  --   ALBUMIN 2.7* 2.8* 2.5* 3.0*   No results for input(s): LIPASE, AMYLASE in the last 168 hours. No results for input(s): AMMONIA in the last 168 hours. CBC: Recent Labs  Lab 01/20/21 0356 01/21/21 0254 01/22/21 0348  01/22/21 1902 01/22/21 1910 01/23/21 0054  WBC 13.4* 10.8* 9.2  --  10.6* 11.5*  NEUTROABS  --   --   --   --  6.5 9.1*  HGB 8.7* 8.7* 8.2* 10.9* 8.6* 8.7*  HCT 27.0* 26.6* 25.9* 32.0* 26.0* 27.1*  MCV 90.3 89.3 92.2  --  91.5 90.9  PLT 81* 82* 97*  --  107* 108*   Cardiac Enzymes: No results for input(s): CKTOTAL, CKMB, CKMBINDEX, TROPONINI in the last 168 hours. CBG: Recent Labs  Lab 01/22/21 1227 01/22/21 1640 01/22/21 1834 01/22/21 2133 01/23/21 0738  GLUCAP 146* 171* 133* 98 115*    Iron Studies: No results for input(s): IRON, TIBC, TRANSFERRIN, FERRITIN in the last 72 hours. Studies/Results: MR BRAIN WO CONTRAST  Result Date: 01/23/2021 CLINICAL DATA:  Seizure EXAM: MRI HEAD WITHOUT CONTRAST TECHNIQUE: Multiplanar, multiecho pulse sequences of the brain and surrounding structures  were obtained without intravenous contrast. COMPARISON:  04/28/2006 FINDINGS: Brain: Small acute infarct of the left pons. Punctate focus of acute ischemia in the right corona radiata. No acute or chronic hemorrhage. There is multifocal hyperintense T2-weighted signal within the white matter. Generalized volume loss without a clear lobar predilection. 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 The midline structures are normal. Vascular: Major flow voids are preserved. Skull and upper cervical spine: Normal calvarium and skull base. Visualized upper cervical spine and soft tissues are normal. Sinuses/Orbits:No paranasal sinus fluid levels or advanced mucosal thickening. No mastoid or middle ear effusion. Normal orbits. IMPRESSION: 1. Small acute infarct of the left pons. No hemorrhage or mass effect. 2. Punctate focus of acute ischemia in the right corona radiata. 3. 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 Electronically Signed   By: Ulyses Jarred M.D.   On: 01/23/2021 00:30   DG CHEST PORT 1 VIEW  Result Date: 01/22/2021 CLINICAL DATA:  Acute coronary syndrome. EXAM: PORTABLE  CHEST 1 VIEW COMPARISON:  01/11/2021 FINDINGS: Right-sided dialysis catheter is been placed with tip overlying the lower SVC. Patient is post median sternotomy and CABG. Cardiomegaly is not significantly changed. Unchanged mediastinal contours with aortic atherosclerosis. There are streaky bilateral mid lung opacities are not significantly changed, favor scarring. No convincing pulmonary edema. No pneumothorax. No large pleural effusions. IMPRESSION: 1. Right-sided dialysis catheter in place. No pneumothorax. 2. Stable cardiomegaly and streaky bilateral mid lung opacities, favor scarring. Electronically Signed   By: Keith Rake M.D.   On: 01/22/2021 19:09   VAS US CAROTID  Result Date: 01/23/2021 Carotid Arterial Duplex Study Patient Name:  [name]  Date of Exam:   01/23/2021 Medical Rec #: [id number]       Accession #:    [id number] Date of Birth: 11-01-1950        Patient Gender: M Patient Age:   35 years Exam Location:  [facility] Procedure:      VAS US CAROTID Referring Phys: Velna Ochs --------------------------------------------------------------------------------  Indications:       CVA. Risk Factors:      Hypertension, hyperlipidemia, Diabetes, past history of                    smoking, prior MI. Comparison Study:  No prior study Performing Technologist: Maudry Mayhew MHA, RDMS, RVT, RDCS  Examination Guidelines: A complete evaluation includes B-mode imaging, spectral Doppler, color Doppler, and power Doppler as needed of all accessible portions of each vessel. Bilateral testing is considered an integral part of a complete examination. Limited examinations for reoccurring indications may be performed as noted.  Right Carotid Findings: +---------+--------+-------+--------+---------------------------------+--------+          PSV cm/sEDV    StenosisPlaque Description               Comments                  cm/s                                                      +---------+--------+-------+--------+---------------------------------+--------+ CCA Prox 111     16             irregular, heterogenous and  calcific                                  +---------+--------+-------+--------+---------------------------------+--------+ CCA      104     13             smooth, heterogenous and calcific         Distal                                                                    +---------+--------+-------+--------+---------------------------------+--------+ ICA Prox 107     33             heterogenous and calcific                 +---------+--------+-------+--------+---------------------------------+--------+ ICA      111     32                                                       Distal                                                                    +---------+--------+-------+--------+---------------------------------+--------+ ECA      123                                                              +---------+--------+-------+--------+---------------------------------+--------+ +----------+--------+-------+------------------------------+-------------------+           PSV cm/sEDV cmsDescribe                      Arm Pressure (mmHG) +----------+--------+-------+------------------------------+-------------------+ Subclavian               Unable to visualize due to                                                 bandaging                                         +----------+--------+-------+------------------------------+-------------------+ +---------+--------+--+--------+--+---------+ VertebralPSV cm/s62EDV cm/s16Antegrade +---------+--------+--+--------+--+---------+  Left Carotid Findings: +----------+--------+--------+--------+------------------+------------------+           PSV cm/sEDV cm/sStenosisPlaque DescriptionComments            +----------+--------+--------+--------+------------------+------------------+ CCA Prox  207     19                                                   +----------+--------+--------+--------+------------------+------------------+  CCA Distal112     14                                intimal thickening +----------+--------+--------+--------+------------------+------------------+ ICA Prox  181     41      40-59%                                       +----------+--------+--------+--------+------------------+------------------+ ICA Mid   133     27                                                   +----------+--------+--------+--------+------------------+------------------+ ICA Distal96      29                                                   +----------+--------+--------+--------+------------------+------------------+ ECA       178                                                          +----------+--------+--------+--------+------------------+------------------+ +----------+--------+--------+--------+-------------------+           PSV cm/sEDV cm/sDescribeArm Pressure (mmHG) +----------+--------+--------+--------+-------------------+ Subclavian300             Stenotic                    +----------+--------+--------+--------+-------------------+ +---------+--------+--------+---------------+ VertebralPSV cm/sEDV cm/sBi- directional +---------+--------+--------+---------------+   Summary: Right Carotid: Velocities in the right ICA are consistent with a 1-39% stenosis. Left Carotid: Velocities in the left ICA are consistent with a low range 40-59%               stenosis. Vertebrals:  Right vertebral artery demonstrates antegrade flow. Left vertebral              artery demonstrates bidirectional flow. Subclavians: Right subclavian artery was not visualized. Left subclavian artery              was stenotic. *See table(s) above for measurements and observations.      Preliminary     (feeding supplement) PROSource Plus  30 mL Oral BID BM   amiodarone  200 mg Oral Daily   aspirin EC  81 mg Oral q morning   atorvastatin  40 mg Oral QHS   Chlorhexidine Gluconate Cloth  6 each Topical Q0600   clopidogrel  75 mg Oral Daily   darbepoetin (ARANESP) injection - DIALYSIS  60 mcg Intravenous Q Thu-HD   feeding supplement (NEPRO CARB STEADY)  237 mL Oral BID BM   gabapentin  300 mg Oral QHS   heparin sodium (porcine)  1.6 mL Intravenous Once   insulin aspart  0-15 Units Subcutaneous TID WC   insulin aspart  4 Units Subcutaneous TID WC   multivitamin  1 tablet Oral QHS   pantoprazole  40 mg Oral Q0600   sodium bicarbonate  650 mg Oral BID    BMET  Component Value Date/Time   NA 134 (L) 01/23/2021 0054   NA 135 07/31/2019 1433   K 4.1 01/23/2021 0054   CL 95 (L) 01/23/2021 0054   CO2 23 01/23/2021 0054   GLUCOSE 87 01/23/2021 0054   BUN 39 (H) 01/23/2021 0054   BUN 35 (H) 07/31/2019 1433   CREATININE 4.96 (H) 01/23/2021 0054   CALCIUM 8.3 (L) 01/23/2021 0054   GFRNONAA 12 (L) 01/23/2021 0054   GFRAA 19 (L) 10/12/2019 1548   CBC    Component Value Date/Time   WBC 11.5 (H) 01/23/2021 0054   RBC 2.98 (L) 01/23/2021 0054   HGB 8.7 (L) 01/23/2021 0054   HGB 9.4 (L) 02/06/2019 1016   HCT 27.1 (L) 01/23/2021 0054   HCT 28.4 (L) 02/06/2019 1016   PLT 108 (L) 01/23/2021 0054   PLT 199 02/06/2019 1016   MCV 90.9 01/23/2021 0054   MCV 86 02/06/2019 1016   MCH 29.2 01/23/2021 0054   MCHC 32.1 01/23/2021 0054   RDW 17.4 (H) 01/23/2021 0054   RDW 18.5 (H) 02/06/2019 1016   LYMPHSABS 0.9 01/23/2021 0054   LYMPHSABS 1.3 01/29/2019 1033   MONOABS 1.3 (H) 01/23/2021 0054   EOSABS 0.0 01/23/2021 0054   EOSABS 0.1 01/29/2019 1033   BASOSABS 0.0 01/23/2021 0054   BASOSABS 0.0 01/29/2019 1033    Assessment/Plan:   Small acute infarct of left pons - presented with acute onset of AMS and seizure like activity.  Neuro following.  New ESRD - progression  likely exacerbated by cardiorenal syndrome. S/p 5 HD sessions.  He is currently set up for TTS at [facility] once out of CIR.  Continue with TTS schedule.  Chronic systolic CHF - improved volume status with HD and UF. 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  Will dose again today.  Thrombocytopenia - no heparin Abnormal LFT's- per primary. HTN -stable Vascular access -s /p LUE AVG placement 01/19/21 with some edema but no pain.  Continue to follow for evidence of steal syndrome Disposition - for CIR pending insurance clearance.  Set up for HD at [facility] on TTS schedule and will continue with that schedule while he remains an inpatient.   Donetta Potts, MD [employer] [phone]

## 2021-01-23 NOTE — Progress Notes (Signed)
OT Cancellation Note  Patient Details Name: [name] MRN: [id number] DOB: Jan 14, 1951   Cancelled Treatment:    Reason Eval/Treat Not Completed: Medical issues which prohibited therapy (Code was called yesterday evening, 11/17, for suspected seizure.  MRI revealed a small acute infarct of the left pons.  MRI and EEG scheduled for today.  Patient is lethargic and declining therapy.) Lodema Hong, [place]  Pager [phone] Office [phone]  Trixie Dredge 01/23/2021, 12:08 PM

## 2021-01-23 NOTE — TOC Progression Note (Signed)
Transition of Care [facility]) - Progression Note    Patient Details  Name: [name] MRN: [id number] Date of Birth: 05/17/1950  [facility]) CM/SW Contact  [name], Renea Ee, RN Phone Number: 01/23/2021, 9:14 AM  Clinical Narrative:    CM received an e-mail response from [name], [place] [employer] Coordinator that patient has been pre-certified for [facility] [place] to and from dialysis. CM will continue to follow with needs.    Expected Discharge Plan: [place] Barriers to Discharge: Barriers Resolved  Expected Discharge Plan and Services Expected Discharge Plan: [place] In-house Referral: Hospice / Palliative Care Discharge Planning Services: CM Consult Post Acute Care Choice: [place] arrangements for the past 2 months: Single Family Home Expected Discharge Date: 01/22/21               DME Arranged: N/A DME Agency: NA       HH Arranged: NA HH Agency: NA         Social Determinants of Health (SDOH) Interventions    Readmission Risk Interventions Readmission Risk Prevention Plan 01/15/2021 02/02/2019  Transportation Screening Complete Complete  PCP or Specialist Appt within 5-7 Days - Complete  PCP or Specialist Appt within 3-5 Days Complete -  Home Care Screening - Complete  Medication Review (RN CM) - Complete  HRI or Home Care Consult Complete -  Social Work Consult for Recovery Care Planning/Counseling Complete -  Palliative Care Screening Complete -  Medication Review Press photographer) Complete -  Some recent data might be hidden

## 2021-01-24 DIAGNOSIS — I5022 Chronic systolic (congestive) heart failure: Secondary | ICD-10-CM

## 2021-01-24 DIAGNOSIS — I249 Acute ischemic heart disease, unspecified: Secondary | ICD-10-CM

## 2021-01-24 LAB — GLUCOSE, CAPILLARY
Glucose-Capillary: 244 mg/dL — ABNORMAL HIGH (ref 70–99)
Glucose-Capillary: 138 mg/dL — ABNORMAL HIGH (ref 70–99)
Glucose-Capillary: 157 mg/dL — ABNORMAL HIGH (ref 70–99)
Glucose-Capillary: 186 mg/dL — ABNORMAL HIGH (ref 70–99)
Glucose-Capillary: 140 mg/dL — ABNORMAL HIGH (ref 70–99)

## 2021-01-24 LAB — COMPREHENSIVE METABOLIC PANEL
ALT: 20 U/L (ref 0–44)
AST: 70 U/L — ABNORMAL HIGH (ref 15–41)
Albumin: 2.6 g/dL — ABNORMAL LOW (ref 3.5–5.0)
Alkaline Phosphatase: 82 U/L (ref 38–126)
Anion gap: 12 (ref 5–15)
BUN: 53 mg/dL — ABNORMAL HIGH (ref 8–23)
CO2: 24 mmol/L (ref 22–32)
Calcium: 8.1 mg/dL — ABNORMAL LOW (ref 8.9–10.3)
Chloride: 94 mmol/L — ABNORMAL LOW (ref 98–111)
Creatinine, Ser: 6.39 mg/dL — ABNORMAL HIGH (ref 0.61–1.24)
GFR, Estimated: 9 mL/min — ABNORMAL LOW (ref >60)
Glucose, Bld: 232 mg/dL — ABNORMAL HIGH (ref 70–99)
Potassium: 4.4 mmol/L (ref 3.5–5.1)
Sodium: 130 mmol/L — ABNORMAL LOW (ref 135–145)
Total Bilirubin: 2.3 mg/dL — ABNORMAL HIGH (ref 0.3–1.2)
Total Protein: 5.6 g/dL — ABNORMAL LOW (ref 6.5–8.1)

## 2021-01-24 LAB — CBC
HCT: 25.4 % — ABNORMAL LOW (ref 39.0–52.0)
Hemoglobin: 8 g/dL — ABNORMAL LOW (ref 13.0–17.0)
MCH: 28.9 pg (ref 26.0–34.0)
MCHC: 31.5 g/dL (ref 30.0–36.0)
MCV: 91.7 fL (ref 80.0–100.0)
Platelets: 155 10*3/uL (ref 150–400)
RBC: 2.77 MIL/uL — ABNORMAL LOW (ref 4.22–5.81)
RDW: 17.5 % — ABNORMAL HIGH (ref 11.5–15.5)
WBC: 12.5 10*3/uL — ABNORMAL HIGH (ref 4.0–10.5)
nRBC: 0.8 % — ABNORMAL HIGH (ref 0.0–0.2)

## 2021-01-24 MED ORDER — INSULIN DETEMIR 100 UNIT/ML ~~LOC~~ SOLN
8.0000 [IU] | Freq: Every day | SUBCUTANEOUS | Status: DC
  Administered 2021-01-24: 8 [IU] via SUBCUTANEOUS
  Filled 2021-01-24 (×2): qty 0.08

## 2021-01-24 MED ORDER — ALBUMIN HUMAN 25 % IV SOLN
25.0000 g | Freq: Once | INTRAVENOUS | Status: AC
  Filled 2021-01-24: qty 100

## 2021-01-24 MED ORDER — ALBUMIN HUMAN 25 % IV SOLN
INTRAVENOUS | Status: AC
  Administered 2021-01-24: 25 g via INTRAVENOUS
  Filled 2021-01-24: qty 100

## 2021-01-24 NOTE — Procedures (Signed)
I was present at this dialysis session. I have reviewed the session itself and made appropriate changes.   Vital signs in last 24 hours:  Temp:  [97.7 F (36.5 C)-99.3 F (37.4 C)] 99 F (37.2 C) (11/19 0738) Pulse Rate:  [60-69] 60 (11/19 0758) Resp:  [12-22] 15 (11/19 0738) BP: (98-123)/(44-83) 100/83 (11/19 0758) SpO2:  [98 %-100 %] 99 % (11/19 0738) Weight:  [103.4 kg-106.1 kg] 103.4 kg (11/19 0738) Weight change: 4.5 kg Filed Weights   01/23/21 0425 01/24/21 0413 01/24/21 0738  Weight: 102.5 kg 106.1 kg 103.4 kg    Recent Labs  Lab 01/23/21 0054 01/24/21 0151  NA 134* 130*  K 4.1 4.4  CL 95* 94*  CO2 23 24  GLUCOSE 87 232*  BUN 39* 53*  CREATININE 4.96* 6.39*  CALCIUM 8.3* 8.1*  PHOS 3.9  --     Recent Labs  Lab 01/22/21 1910 01/23/21 0054 01/24/21 0151  WBC 10.6* 11.5* 12.5*  NEUTROABS 6.5 9.1*  --   HGB 8.6* 8.7* 8.0*  HCT 26.0* 27.1* 25.4*  MCV 91.5 90.9 91.7  PLT 107* 108* 155    Scheduled Meds:  (feeding supplement) PROSource Plus  30 mL Oral BID BM   amiodarone  200 mg Oral Daily   aspirin EC  81 mg Oral q morning   atorvastatin  80 mg Oral QHS   Chlorhexidine Gluconate Cloth  6 each Topical Q0600   clopidogrel  75 mg Oral Daily   darbepoetin (ARANESP) injection - DIALYSIS  60 mcg Intravenous Q Thu-HD   feeding supplement (NEPRO CARB STEADY)  237 mL Oral BID BM   gabapentin  300 mg Oral QHS   heparin sodium (porcine)  1.6 mL Intravenous Once   insulin aspart  0-15 Units Subcutaneous TID WC   insulin aspart  4 Units Subcutaneous TID WC   mouth rinse  15 mL Mouth Rinse BID   multivitamin  1 tablet Oral QHS   pantoprazole  40 mg Oral Q0600   sodium bicarbonate  650 mg Oral BID   Continuous Infusions:  sodium chloride Stopped (01/13/21 1834)   sodium chloride 10 mL/hr at 01/23/21 1505   PRN Meds:.sodium chloride, acetaminophen **OR** acetaminophen, melatonin, nitroGLYCERIN, ondansetron (ZOFRAN) IV, oxyCODONE-acetaminophen, senna-docusate       Assessment/Plan:   Small acute infarct of left pons - presented with acute onset of AMS and seizure like activity.  Neuro following.  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 S/p 5 HD sessions.  He is currently set up for TTS at [facility] once out of CIR.  Continue with TTS schedule.  Chronic systolic CHF - improved volume status with HD and UF. 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  Will dose again today.  Thrombocytopenia - no heparin Abnormal LFT's- per primary. HTN -stable Vascular access -s /p LUE AVG placement 01/19/21 with some edema but no pain.  Continue to follow for evidence of steal syndrome Disposition - for CIR pending insurance clearance.  Set up for HD at [facility] on TTS schedule and will continue with that schedule while he remains an inpatient.    Donetta Potts,  MD 01/24/2021, 8:07 AM

## 2021-01-24 NOTE — Progress Notes (Signed)
HD#13 Subjective:  Patient Summary: [name] is a 70 yr old male with PMHx of hypertension, type 2 diabetes mellitus, peripheral artery disease s/p bilateral BKAs, CAD s/p CABG, HFrEF (EF 30-35%), and CKDV admitted for HFrEF exacerbation, progressive renal disease to ESRD started on HD. Hospital course complicated by acute CVA, no current residual deficits.   Overnight Events: No acute events overnight.    Interim History: [name] was evaluated at bedside this morning during hemodialysis. He denies any pain or weakness at this time. He denies any acute concerns at this time. Discussed plans for CIR tomorrow for which he is agreeable.   Objective:  Vital signs in last 24 hours: Vitals:   01/23/21 2150 01/24/21 0058 01/24/21 0100 01/24/21 0413  BP: (!) 105/54 (!) 106/55 (!) 106/55 (!) 109/52  Pulse: 68 64 65 62  Resp: 16 17 16 19   Temp: 98 F (36.7 C) 98.3 F (36.8 C) 98.3 F (36.8 C) 97.7 F (36.5 C)  TempSrc: Oral Oral Oral Oral  SpO2: 98% 99% 100% 100%  Weight:    106.1 kg  Height:       Supplemental O2: Nasal Cannula SpO2: 100 % O2 Flow Rate (L/min): 2 L/min   Physical Exam:  Constitutional: chronically ill appearing elderly male on HD, in no acute distress HENT: normocephalic atraumatic, EOMI, anicteric sclerae Cardiovascular: regular rate and rhythm, no m/r/g Pulmonary/Chest: normal work of breathing on room air, lungs clear to auscultation bilaterally Abdominal: soft, non-tender, non-distended, +BS MSK: normal bulk and tone; bilateral BKA Neurological: alert & oriented x 3, no apparent focal deficits noted Skin: warm and dry Psych: Behavior and mood nl.   Filed Weights   01/22/21 1957 01/23/21 0425 01/24/21 0413  Weight: 101.2 kg 102.5 kg 106.1 kg     Intake/Output Summary (Last 24 hours) at 01/24/2021 0652 Last data filed at 01/24/2021 0418 Gross per 24 hour  Intake 243.54 ml  Output --  Net 243.54 ml   Net IO Since Admission: -311 mL [01/24/21  0652]  Pertinent Labs: CBC Latest Ref Rng & Units 01/24/2021 01/23/2021 01/22/2021  WBC 4.0 - 10.5 K/uL 12.5(H) 11.5(H) 10.6(H)  Hemoglobin 13.0 - 17.0 g/dL 8.0(L) 8.7(L) 8.6(L)  Hematocrit 39.0 - 52.0 % 25.4(L) 27.1(L) 26.0(L)  Platelets 150 - 400 K/uL 155 108(L) 107(L)    CMP Latest Ref Rng & Units 01/24/2021 01/23/2021 01/22/2021  Glucose 70 - 99 mg/dL 232(H) 87 -  BUN 8 - 23 mg/dL 53(H) 39(H) -  Creatinine 0.61 - 1.24 mg/dL 6.39(H) 4.96(H) -  Sodium 135 - 145 mmol/L 130(L) 134(L) 134(L)  Potassium 3.5 - 5.1 mmol/L 4.4 4.1 3.5  Chloride 98 - 111 mmol/L 94(L) 95(L) -  CO2 22 - 32 mmol/L 24 23 -  Calcium 8.9 - 10.3 mg/dL 8.1(L) 8.3(L) -  Total Protein 6.5 - 8.1 g/dL 5.6(L) - -  Total Bilirubin 0.3 - 1.2 mg/dL 2.3(H) - -  Alkaline Phos 38 - 126 U/L 82 - -  AST 15 - 41 U/L 70(H) - -  ALT 0 - 44 U/L 20 - -    Imaging: Thomas ANGIO HEAD WO CONTRAST  Result Date: 01/23/2021 CLINICAL DATA:  Stroke, follow up EXAM: MRA HEAD WITHOUT CONTRAST TECHNIQUE: Angiographic images of the Circle of Willis were acquired using MRA technique without intravenous contrast. COMPARISON:  Same day MRI head.  MRA April 28, 2006. FINDINGS: Anterior circulation: Motion limited evaluation of the intracranial ICAs, with potentially moderate right and severe left paraclinoid ICA  stenosis. Bilateral MCAs and ACAs are patent. Hypoplastic left A1 ACA. Small (2 mm) anteriorly directed outpouching arising from the right paraclinoid ICA (series 3, image 81). Posterior circulation: Right dominant intradural vertebral artery. Multifocal moderate stenosis of the small/non dominant intradural left vertebral artery. Bilateral intradural vertebral arteries, basilar artery, and posterior cerebral arteries are patent without proximal hemodynamically significant stenosis. Right larger than left posterior communicating arteries with smaller absent right P1 PCA, anatomic variant. Anatomic variants: Detailed above. IMPRESSION: 1. No  large vessel occlusion. 2. Motion limited evaluation of the intracranial ICAs with potentially severe left and moderate right paraclinoid ICA stenosis. A CTA could confirm and further characterize if clinically indicated. 3. Multifocal moderate stenosis of the small/non dominant left intradural vertebral artery. 4. Small (2 mm) anteriorly directed outpouching arising from the right paraclinoid ICA, compatible with aneurysm versus infundibulum. Electronically Signed   By: Margaretha Sheffield M.D.   On: 01/23/2021 12:56   EEG adult  Result Date: 01/23/2021 Lora Havens, MD     01/23/2021  5:33 PM Patient Name: [name] MRN: [id number] Epilepsy Attending: Lora Havens Referring Physician/Provider: Dr Kerney Elbe Date: 01/23/2021 Duration: 22.53 mins Patient history: 70yo M with ams, tongue bite. EEG to evaluate for seizure. Level of alertness: Awake, asleep AEDs during EEG study: GBP Technical aspects: This EEG study was done with scalp electrodes positioned according to the 10-20 International system of electrode placement. Electrical activity was acquired at a sampling rate of 500Hz  and reviewed with a high frequency filter of 70Hz  and a low frequency filter of 1Hz . EEG data were recorded continuously and digitally stored. Description: The posterior dominant rhythm consists of 8 Hz activity of moderate voltage (25-35 uV) seen predominantly in posterior head regions, symmetric and reactive to eye opening and eye closing. Sleep was characterized by vertex waves, sleep spindles (12 to 14 Hz), maximal frontocentral region. EEG showed intermittent generalized 3 to 6 Hz theta-delta slowing. Hyperventilation and photic stimulation were not performed.   ABNORMALITY - Intermittent slow, generalized IMPRESSION: This study is suggestive of mild diffuse encephalopathy, nonspecific etiology. No seizures or epileptiform discharges were seen throughout the recording. Priyanka O Yadav   VAS US CAROTID  Result  Date: 01/23/2021 Carotid Arterial Duplex Study Patient Name:  [name]  Date of Exam:   01/23/2021 Medical Rec #: [id number]       Accession #:    [id number] Date of Birth: 1950-12-03        Patient Gender: M Patient Age:   7 years Exam Location:  [facility] Procedure:      VAS US CAROTID Referring Phys: Velna Ochs --------------------------------------------------------------------------------  Indications:       CVA. Risk Factors:      Hypertension, hyperlipidemia, Diabetes, past history of                    smoking, prior MI. Comparison Study:  No prior study Performing Technologist: Maudry Mayhew MHA, RDMS, RVT, RDCS  Examination Guidelines: A complete evaluation includes B-mode imaging, spectral Doppler, color Doppler, and power Doppler as needed of all accessible portions of each vessel. Bilateral testing is considered an integral part of a complete examination. Limited examinations for reoccurring indications may be performed as noted.  Right Carotid Findings: +---------+--------+-------+--------+---------------------------------+--------+          PSV cm/sEDV    StenosisPlaque Description               Comments  cm/s                                                     +---------+--------+-------+--------+---------------------------------+--------+ CCA Prox 111     16             irregular, heterogenous and                                               calcific                                  +---------+--------+-------+--------+---------------------------------+--------+ CCA      104     13             smooth, heterogenous and calcific         Distal                                                                    +---------+--------+-------+--------+---------------------------------+--------+ ICA Prox 107     33             heterogenous and calcific                  +---------+--------+-------+--------+---------------------------------+--------+ ICA      111     32                                                       Distal                                                                    +---------+--------+-------+--------+---------------------------------+--------+ ECA      123                                                              +---------+--------+-------+--------+---------------------------------+--------+ +----------+--------+-------+------------------------------+-------------------+           PSV cm/sEDV cmsDescribe                      Arm Pressure (mmHG) +----------+--------+-------+------------------------------+-------------------+ Subclavian               Unable to visualize due to  bandaging                                         +----------+--------+-------+------------------------------+-------------------+ +---------+--------+--+--------+--+---------+ VertebralPSV cm/s62EDV cm/s16Antegrade +---------+--------+--+--------+--+---------+  Left Carotid Findings: +----------+--------+--------+--------+------------------+------------------+           PSV cm/sEDV cm/sStenosisPlaque DescriptionComments           +----------+--------+--------+--------+------------------+------------------+ CCA Prox  207     19                                                   +----------+--------+--------+--------+------------------+------------------+ CCA Distal112     14                                intimal thickening +----------+--------+--------+--------+------------------+------------------+ ICA Prox  181     41      40-59%                                       +----------+--------+--------+--------+------------------+------------------+ ICA Mid   133     27                                                    +----------+--------+--------+--------+------------------+------------------+ ICA Distal96      29                                                   +----------+--------+--------+--------+------------------+------------------+ ECA       178                                                          +----------+--------+--------+--------+------------------+------------------+ +----------+--------+--------+--------+-------------------+           PSV cm/sEDV cm/sDescribeArm Pressure (mmHG) +----------+--------+--------+--------+-------------------+ Subclavian300             Stenotic                    +----------+--------+--------+--------+-------------------+ +---------+--------+--------+---------------+ VertebralPSV cm/sEDV cm/sBi- directional +---------+--------+--------+---------------+   Summary: Right Carotid: Velocities in the right ICA are consistent with a 1-39% stenosis. Left Carotid: Velocities in the left ICA are consistent with a low range 40-59%               stenosis. Vertebrals:  Right vertebral artery demonstrates antegrade flow. Left vertebral              artery demonstrates bidirectional flow. Subclavians: Right subclavian artery was not visualized. Left subclavian artery              was stenotic. *See table(s) above for measurements and observations.     Preliminary     Assessment/Plan:   Principal Problem:   ESRD needing dialysis ([place])  Active Problems:   Goals of care, counseling/discussion   Acute on chronic congestive heart failure (HCC)   Ventricular tachycardia, non-sustained   Benign prostatic hyperplasia   Seizure (HCC)   Acute CVA (cerebrovascular accident) ([place])   Patient Summary: [name] is a 70 yr old male with PMHx of hypertension, type 2 diabetes mellitus, peripheral artery disease s/p bilateral BKAs, CAD s/p CABG, HFrEF (EF 30-35%), and CKDV admitted for HFrEF exacerbation, progressive renal disease to ESRD started on HD. Hospital  course complicated by acute CVA, no current residual deficits.    ESRD on HD S/p LUE AV graft placement Patient with history of advanced renal disease (CKD V) that is now progressed to end-stage renal disease requiring hemodialysis.  He is status post left upper extremity AV graft placement on 11/14.  Has tunneled HD cath through which she is getting his dialysis.  He is set up for TTS dialysis as outpatient. -HD today -Follow-up with nephrology as outpatient -Follow-up with vascular surgery as outpatient -Continue to monitor renal function  Acute CVA On evening of 11/17, patient noted to have seizure-like episode and became unresponsive with tongue biting and bowel/bladder incontinence.  No loss of pulse.  He had quick neurologic recovery and did not require intubation.  Neurology consulted and MRI brain revealed 2 small acute infarcts of the left pons and right corona radiata.  EEG negative for seizure-like activity.  Carotid ultrasound with stenosis of left subclavian artery, left ICA 40-59% stenosis in right ICA with 1-39% stenosis.  MRA head without large vessel occlusion but does have potentially severe left and moderate right paraclinoid ICA stenosis.  No neurologic deficits at this point.  Patient is at baseline. -Neurology consulted, appreciate recommendations -30-day cardiac monitor on dischage -Continue dual antiplatelet therapy for 3 weeks followed by Plavix alone indefinitely -Increase statin dose to Lipitor 80 mg daily -Avoiding hypotension -Continue cardiac monitor -Continue PT/OT during admission -Discharged to CIR planned 11/20  Type 2 diabetes mellitus Most recent hemoglobin A1c 6.8.  Patient is not on any diabetes medication at home.  Currently on NovoLog 4 units 3 times daily with sliding scale.  CBGs have been elevated to 244. -Start Levemir 8 units nightly -Continue NovoLog 4 units 3 times daily with sliding scale insulin -Continue CBG monitoring  Acute on chronic  HFrEF exacerbation -resolved Patient presented initially with dyspnea and lower extremity edema in setting of hypervolemia secondary to HFrEF exacerbation complicated by ESRD.  This is improved with hemodialysis.  He appears euvolemic on exam. -Currently holding metoprolol, Imdur, hydralazine and setting of hypotension -Cardiology follow-up on discharge  Sustained Vtach - resolved  Patient initially presented with sustained V. tach in setting of electrolyte abnormalities and metabolic acidosis.  He denied chest pain at that time.  He was started on amiodarone infusion with improvement.  He remains on oral amiodarone.  Dosing adjusted to avoid hypotension and bradycardia. -Continue amiodarone 200 mg daily -Continue cardiac monitor  Diet: Renal IVF: None,None VTE: None Code: Full PT/OT recs: CIR TOC recs: transfer to CIR 11/20  Prior to Admission Living Arrangement: Home Anticipated Discharge Location: CIR Barriers to Discharge: pending CIR placement 11/20 Dispo: Anticipated discharge to  CIR  in 1 days pending placement.   Harvie Heck, MD Internal Medicine Resident PGY-3 Pager# [phone]  Please contact the on call pager after 5 pm and on weekends at [phone].

## 2021-01-24 NOTE — Progress Notes (Signed)
Physical Therapy Treatment Patient Details Name: [name] MRN: [id number] DOB: 11-21-1950 Today's Date: 01/24/2021   History of Present Illness Pt adm 11/6 with acute on chronic HFrEF. Pt developed Vtach and transferred to ICU. Pt with continued worsening of CKD and going to require HD. L AVG placed 11/14. Pt with unresponsive episode on 11/17, MRI revealed:  small acute infarct to the left pons, punctate focus of acute ischemia in the right CR. PMH: HTN, T2DM, previous CVA, s/p CABG x 4, HLD, CKD IV, HFrEF (last EF 35-40% in 2020), and bilateral BKAs    PT Comments    The pt was seen for re-evaluation after recent potential seizure activity on 11/17. The pt reports no change in sensation or movement in any extremities at time of evaluation. He does present with generally slowed processing and poor ability to problem-solve or sequence movements without step-by-step cues. The pt was agreeable to sit EOB for exercises, but declined further OOB mobility. Pt noted to have increased difficulty with static sitting EOB at this time, needing max cues and physical assist to correct posterior and L lateral lean. The pt continues to acknowledge deficits and need for continued therapies, remains agreeable to acute inpatient therapies at d/c.    Recommendations for follow up therapy are one component of a multi-disciplinary discharge planning process, led by the attending physician.  Recommendations may be updated based on patient status, additional functional criteria and insurance authorization.  Follow Up Recommendations  Acute inpatient rehab (3hours/day)     Assistance Recommended at Discharge Intermittent Supervision/Assistance  Equipment Recommendations  Other (comment) (hoyer lift, slide board)    Recommendations for Other Services       Precautions / Restrictions Precautions Precautions: Fall;Other (comment) Precaution Comments: bilateral BKA Required Braces or Orthoses: Other  Brace Other Brace: Bilateral prostheses Restrictions Weight Bearing Restrictions: No     Mobility  Bed Mobility Overal bed mobility: Needs Assistance Bed Mobility: Rolling;Supine to Sit;Sit to Supine Rolling: Mod assist   Supine to sit: Mod assist;HOB elevated Sit to supine: Mod assist   General bed mobility comments: modA to complete rolls, elevate trunk, and return BLE to bed. the pt needed sequential cues to complete movements and to assist with BUE. able to assist with boosting in bed when directly cued for use of BUE on bed rails    Transfers                   General transfer comment: unable to achieve any movement with lateral scoot transfer. The pt did not follow commands to assist with movement and could not produce any movement on his own, fatigued after 10 min sitting EOB     Modified Rankin (Stroke Patients Only) Modified Rankin (Stroke Patients Only) Pre-Morbid Rankin Score: Moderately severe disability Modified Rankin: Severe disability     Balance Overall balance assessment: Needs assistance Sitting-balance support: Bilateral upper extremity supported;Feet unsupported Sitting balance-Leahy Scale: Zero Sitting balance - Comments: pt dependent on BUE support, posterior and L lateral lean Postural control: Posterior lean;Left lateral lean                                  Cognition Arousal/Alertness: Awake/alert Behavior During Therapy: WFL for tasks assessed/performed Overall Cognitive Status: Impaired/Different from baseline Area of Impairment: Safety/judgement;Awareness;Following commands;Problem solving  Following Commands: Follows one step commands inconsistently;Follows one step commands with increased time Safety/Judgement: Decreased awareness of safety;Decreased awareness of deficits   Problem Solving: Slow processing;Decreased initiation;Difficulty sequencing;Requires verbal cues General Comments:  pt oriented, but asking to hold on therapy until after he got his strength back. pt needing cues to complete all sequencing for bed mobility and movements. once sitting EOB, pt with decrased command following and decreased awareness of positioning.        Exercises General Exercises - Lower Extremity Quad Sets: AROM;Both;5 reps;Supine Hip ABduction/ADduction: AROM;Both;10 reps Other Exercises Other Exercises: lateral leaning to elbows in sitting. max cues and modA to achieve leaning to R elbow    General Comments General comments (skin integrity, edema, etc.): VSS on 3L O2, pt with swelling of LUE      Pertinent Vitals/Pain Pain Assessment: No/denies pain Pain Intervention(s): Monitored during session     PT Goals (current goals can now be found in the care plan section) Acute Rehab PT Goals Patient Stated Goal: Return to prior level of function PT Goal Formulation: With patient Time For Goal Achievement: 01/28/21 Potential to Achieve Goals: Fair Progress towards PT goals: Progressing toward goals    Frequency    Min 3X/week      PT Plan Current plan remains appropriate       AM-PAC PT "6 Clicks" Mobility   Outcome Measure  Help needed turning from your back to your side while in a flat bed without using bedrails?: A Lot Help needed moving from lying on your back to sitting on the side of a flat bed without using bedrails?: A Lot Help needed moving to and from a bed to a chair (including a wheelchair)?: Total Help needed standing up from a chair using your arms (e.g., wheelchair or bedside chair)?: Total Help needed to walk in hospital room?: Total Help needed climbing 3-5 steps with a railing? : Total 6 Click Score: 8    End of Session Equipment Utilized During Treatment: Oxygen Activity Tolerance: Patient limited by fatigue Patient left: in bed;with call bell/phone within reach;with bed alarm set Nurse Communication: Mobility status;Need for lift equipment PT  Visit Diagnosis: Other abnormalities of gait and mobility (R26.89);Muscle weakness (generalized) (M62.81)     Time: 4193-7902 PT Time Calculation (min) (ACUTE ONLY): 35 min  Charges:  $Therapeutic Exercise: 8-22 mins $Therapeutic Activity: 8-22 mins                     West Carbo, PT, DPT   Acute Rehabilitation Department Pager #: [phone]   Sandra Cockayne 01/24/2021, 4:09 PM

## 2021-01-25 ENCOUNTER — Encounter (HOSPITAL_COMMUNITY): Payer: Self-pay | Admitting: Physical Medicine and Rehabilitation

## 2021-01-25 ENCOUNTER — Other Ambulatory Visit: Payer: Self-pay

## 2021-01-25 ENCOUNTER — Inpatient Hospital Stay (HOSPITAL_COMMUNITY)
Admission: RE | Admit: 2021-01-25 | Discharge: 2021-02-11 | DRG: 056 | Disposition: A | Discharge status: Home Health | Payer: Medicare Other | Source: Intra-hospital | Attending: Physical Medicine and Rehabilitation | Admitting: Physical Medicine and Rehabilitation

## 2021-01-25 DIAGNOSIS — K219 Gastro-esophageal reflux disease without esophagitis: Secondary | ICD-10-CM | POA: Diagnosis present

## 2021-01-25 DIAGNOSIS — Z89512 Acquired absence of left leg below knee: Secondary | ICD-10-CM

## 2021-01-25 DIAGNOSIS — Z7982 Long term (current) use of aspirin: Secondary | ICD-10-CM

## 2021-01-25 DIAGNOSIS — N179 Acute kidney failure, unspecified: Secondary | ICD-10-CM

## 2021-01-25 DIAGNOSIS — Z79899 Other long term (current) drug therapy: Secondary | ICD-10-CM

## 2021-01-25 DIAGNOSIS — I639 Cerebral infarction, unspecified: Secondary | ICD-10-CM | POA: Diagnosis present

## 2021-01-25 DIAGNOSIS — D696 Thrombocytopenia, unspecified: Secondary | ICD-10-CM | POA: Diagnosis present

## 2021-01-25 DIAGNOSIS — I5022 Chronic systolic (congestive) heart failure: Secondary | ICD-10-CM | POA: Diagnosis present

## 2021-01-25 DIAGNOSIS — E1165 Type 2 diabetes mellitus with hyperglycemia: Secondary | ICD-10-CM

## 2021-01-25 DIAGNOSIS — R5381 Other malaise: Secondary | ICD-10-CM | POA: Diagnosis present

## 2021-01-25 DIAGNOSIS — I69311 Memory deficit following cerebral infarction: Secondary | ICD-10-CM

## 2021-01-25 DIAGNOSIS — E669 Obesity, unspecified: Secondary | ICD-10-CM | POA: Diagnosis present

## 2021-01-25 DIAGNOSIS — I132 Hypertensive heart and chronic kidney disease with heart failure and with stage 5 chronic kidney disease, or end stage renal disease: Secondary | ICD-10-CM | POA: Diagnosis present

## 2021-01-25 DIAGNOSIS — K5901 Slow transit constipation: Secondary | ICD-10-CM | POA: Diagnosis present

## 2021-01-25 DIAGNOSIS — E1122 Type 2 diabetes mellitus with diabetic chronic kidney disease: Secondary | ICD-10-CM | POA: Diagnosis present

## 2021-01-25 DIAGNOSIS — I2510 Atherosclerotic heart disease of native coronary artery without angina pectoris: Secondary | ICD-10-CM | POA: Diagnosis present

## 2021-01-25 DIAGNOSIS — Z6838 Body mass index (BMI) 38.0-38.9, adult: Secondary | ICD-10-CM | POA: Diagnosis not present

## 2021-01-25 DIAGNOSIS — E871 Hypo-osmolality and hyponatremia: Secondary | ICD-10-CM | POA: Diagnosis present

## 2021-01-25 DIAGNOSIS — Z89511 Acquired absence of right leg below knee: Secondary | ICD-10-CM | POA: Diagnosis not present

## 2021-01-25 DIAGNOSIS — R278 Other lack of coordination: Secondary | ICD-10-CM | POA: Diagnosis present

## 2021-01-25 DIAGNOSIS — D631 Anemia in chronic kidney disease: Secondary | ICD-10-CM | POA: Diagnosis present

## 2021-01-25 DIAGNOSIS — J9811 Atelectasis: Secondary | ICD-10-CM | POA: Diagnosis present

## 2021-01-25 DIAGNOSIS — N186 End stage renal disease: Secondary | ICD-10-CM | POA: Diagnosis present

## 2021-01-25 DIAGNOSIS — Z87891 Personal history of nicotine dependence: Secondary | ICD-10-CM

## 2021-01-25 DIAGNOSIS — R509 Fever, unspecified: Secondary | ICD-10-CM | POA: Diagnosis not present

## 2021-01-25 DIAGNOSIS — I252 Old myocardial infarction: Secondary | ICD-10-CM

## 2021-01-25 DIAGNOSIS — R2689 Other abnormalities of gait and mobility: Secondary | ICD-10-CM | POA: Diagnosis present

## 2021-01-25 DIAGNOSIS — M898X9 Other specified disorders of bone, unspecified site: Secondary | ICD-10-CM | POA: Diagnosis present

## 2021-01-25 DIAGNOSIS — Z992 Dependence on renal dialysis: Secondary | ICD-10-CM

## 2021-01-25 DIAGNOSIS — I472 Ventricular tachycardia, unspecified: Secondary | ICD-10-CM | POA: Diagnosis present

## 2021-01-25 DIAGNOSIS — R7309 Other abnormal glucose: Secondary | ICD-10-CM

## 2021-01-25 DIAGNOSIS — I69398 Other sequelae of cerebral infarction: Secondary | ICD-10-CM | POA: Diagnosis not present

## 2021-01-25 DIAGNOSIS — Z951 Presence of aortocoronary bypass graft: Secondary | ICD-10-CM

## 2021-01-25 DIAGNOSIS — D638 Anemia in other chronic diseases classified elsewhere: Secondary | ICD-10-CM

## 2021-01-25 DIAGNOSIS — Z7902 Long term (current) use of antithrombotics/antiplatelets: Secondary | ICD-10-CM

## 2021-01-25 LAB — GLUCOSE, CAPILLARY
Glucose-Capillary: 140 mg/dL — ABNORMAL HIGH (ref 70–99)
Glucose-Capillary: 180 mg/dL — ABNORMAL HIGH (ref 70–99)
Glucose-Capillary: 136 mg/dL — ABNORMAL HIGH (ref 70–99)
Glucose-Capillary: 154 mg/dL — ABNORMAL HIGH (ref 70–99)

## 2021-01-25 LAB — RENAL FUNCTION PANEL
Albumin: 2.9 g/dL — ABNORMAL LOW (ref 3.5–5.0)
Anion gap: 11 (ref 5–15)
BUN: 38 mg/dL — ABNORMAL HIGH (ref 8–23)
CO2: 24 mmol/L (ref 22–32)
Calcium: 8.1 mg/dL — ABNORMAL LOW (ref 8.9–10.3)
Chloride: 96 mmol/L — ABNORMAL LOW (ref 98–111)
Creatinine, Ser: 5.18 mg/dL — ABNORMAL HIGH (ref 0.61–1.24)
GFR, Estimated: 11 mL/min — ABNORMAL LOW (ref >60)
Glucose, Bld: 150 mg/dL — ABNORMAL HIGH (ref 70–99)
Phosphorus: 4.3 mg/dL (ref 2.5–4.6)
Potassium: 3.7 mmol/L (ref 3.5–5.1)
Sodium: 131 mmol/L — ABNORMAL LOW (ref 135–145)

## 2021-01-25 LAB — CBC
HCT: 24 % — ABNORMAL LOW (ref 39.0–52.0)
HCT: 24.9 % — ABNORMAL LOW (ref 39.0–52.0)
Hemoglobin: 7.6 g/dL — ABNORMAL LOW (ref 13.0–17.0)
Hemoglobin: 7.8 g/dL — ABNORMAL LOW (ref 13.0–17.0)
MCH: 29.3 pg (ref 26.0–34.0)
MCH: 29.4 pg (ref 26.0–34.0)
MCHC: 31.7 g/dL (ref 30.0–36.0)
MCHC: 31.3 g/dL (ref 30.0–36.0)
MCV: 92.7 fL (ref 80.0–100.0)
MCV: 94 fL (ref 80.0–100.0)
Platelets: 165 10*3/uL (ref 150–400)
Platelets: 195 10*3/uL (ref 150–400)
RBC: 2.59 MIL/uL — ABNORMAL LOW (ref 4.22–5.81)
RBC: 2.65 MIL/uL — ABNORMAL LOW (ref 4.22–5.81)
RDW: 18 % — ABNORMAL HIGH (ref 11.5–15.5)
RDW: 18.6 % — ABNORMAL HIGH (ref 11.5–15.5)
WBC: 12.5 10*3/uL — ABNORMAL HIGH (ref 4.0–10.5)
WBC: 11.8 10*3/uL — ABNORMAL HIGH (ref 4.0–10.5)
nRBC: 0.7 % — ABNORMAL HIGH (ref 0.0–0.2)
nRBC: 0.6 % — ABNORMAL HIGH (ref 0.0–0.2)

## 2021-01-25 LAB — BASIC METABOLIC PANEL
Anion gap: 11 (ref 5–15)
BUN: 31 mg/dL — ABNORMAL HIGH (ref 8–23)
CO2: 26 mmol/L (ref 22–32)
Calcium: 8.3 mg/dL — ABNORMAL LOW (ref 8.9–10.3)
Chloride: 96 mmol/L — ABNORMAL LOW (ref 98–111)
Creatinine, Ser: 4.56 mg/dL — ABNORMAL HIGH (ref 0.61–1.24)
GFR, Estimated: 13 mL/min — ABNORMAL LOW (ref >60)
Glucose, Bld: 168 mg/dL — ABNORMAL HIGH (ref 70–99)
Potassium: 3.8 mmol/L (ref 3.5–5.1)
Sodium: 133 mmol/L — ABNORMAL LOW (ref 135–145)

## 2021-01-25 MED ORDER — ONDANSETRON HCL 4 MG/2ML IJ SOLN
4.0000 mg | Freq: Three times a day (TID) | INTRAMUSCULAR | Status: DC | PRN
  Administered 2021-01-25 – 2021-02-08 (×4): 4 mg via INTRAVENOUS
  Filled 2021-01-25 (×4): qty 2

## 2021-01-25 MED ORDER — NEPRO/CARBSTEADY PO LIQD
237.0000 mL | Freq: Two times a day (BID) | ORAL | Status: DC
  Administered 2021-01-26 – 2021-02-10 (×13): 237 mL via ORAL

## 2021-01-25 MED ORDER — SODIUM BICARBONATE 650 MG PO TABS
650.0000 mg | ORAL_TABLET | Freq: Two times a day (BID) | ORAL | Status: DC
  Administered 2021-01-25 – 2021-01-26 (×2): 650 mg via ORAL
  Filled 2021-01-25 (×3): qty 1

## 2021-01-25 MED ORDER — ACETAMINOPHEN 325 MG PO TABS
325.0000 mg | ORAL_TABLET | ORAL | Status: DC | PRN
  Administered 2021-02-09: 650 mg via ORAL
  Filled 2021-01-25: qty 2

## 2021-01-25 MED ORDER — SENNOSIDES-DOCUSATE SODIUM 8.6-50 MG PO TABS
1.0000 | ORAL_TABLET | Freq: Every evening | ORAL | Status: DC | PRN
  Administered 2021-01-29: 1 via ORAL
  Filled 2021-01-25: qty 1

## 2021-01-25 MED ORDER — DARBEPOETIN ALFA 60 MCG/0.3ML IJ SOSY: 60.0000 ug | PREFILLED_SYRINGE | INTRAMUSCULAR | Status: DC

## 2021-01-25 MED ORDER — ASPIRIN EC 81 MG PO TBEC
81.0000 mg | DELAYED_RELEASE_TABLET | Freq: Every morning | ORAL | Status: AC
  Administered 2021-01-26 – 2021-01-30 (×5): 81 mg via ORAL
  Filled 2021-01-25 (×6): qty 1

## 2021-01-25 MED ORDER — FLEET ENEMA 7-19 GM/118ML RE ENEM: 1.0000 | ENEMA | Freq: Once | RECTAL | Status: DC | PRN

## 2021-01-25 MED ORDER — POLYETHYLENE GLYCOL 3350 17 G PO PACK
17.0000 g | PACK | Freq: Every day | ORAL | Status: DC | PRN
  Filled 2021-01-25: qty 1

## 2021-01-25 MED ORDER — DIPHENHYDRAMINE HCL 12.5 MG/5ML PO ELIX
12.5000 mg | ORAL_SOLUTION | Freq: Four times a day (QID) | ORAL | Status: DC | PRN
  Administered 2021-02-09 (×2): 25 mg via ORAL
  Filled 2021-01-25 (×2): qty 10

## 2021-01-25 MED ORDER — PROCHLORPERAZINE 25 MG RE SUPP: 12.5000 mg | Freq: Four times a day (QID) | RECTAL | Status: DC | PRN

## 2021-01-25 MED ORDER — GUAIFENESIN-DM 100-10 MG/5ML PO SYRP
5.0000 mL | ORAL_SOLUTION | Freq: Four times a day (QID) | ORAL | Status: DC | PRN
Start: 2021-01-25 — End: 2021-02-11

## 2021-01-25 MED ORDER — NITROGLYCERIN 0.4 MG SL SUBL: 0.4000 mg | SUBLINGUAL_TABLET | SUBLINGUAL | Status: DC | PRN

## 2021-01-25 MED ORDER — CLOPIDOGREL BISULFATE 75 MG PO TABS: 75.0000 mg | ORAL_TABLET | Freq: Every day | ORAL | Status: DC

## 2021-01-25 MED ORDER — PANTOPRAZOLE SODIUM 40 MG PO TBEC
40.0000 mg | DELAYED_RELEASE_TABLET | Freq: Every day | ORAL | Status: DC
  Administered 2021-01-26 – 2021-02-11 (×17): 40 mg via ORAL
  Filled 2021-01-25 (×17): qty 1

## 2021-01-25 MED ORDER — BLOOD PRESSURE CONTROL BOOK
Freq: Once | Status: AC
  Filled 2021-01-25: qty 1

## 2021-01-25 MED ORDER — PROCHLORPERAZINE EDISYLATE 10 MG/2ML IJ SOLN: 5.0000 mg | Freq: Four times a day (QID) | INTRAMUSCULAR | Status: DC | PRN

## 2021-01-25 MED ORDER — MELATONIN 3 MG PO TABS
3.0000 mg | ORAL_TABLET | Freq: Every evening | ORAL | Status: DC | PRN
  Administered 2021-01-26 – 2021-01-29 (×3): 3 mg via ORAL
  Filled 2021-01-25 (×3): qty 1

## 2021-01-25 MED ORDER — CLOPIDOGREL BISULFATE 75 MG PO TABS
75.0000 mg | ORAL_TABLET | Freq: Every day | ORAL | Status: DC
  Administered 2021-01-26 – 2021-02-11 (×17): 75 mg via ORAL
  Filled 2021-01-25 (×17): qty 1

## 2021-01-25 MED ORDER — ORAL CARE MOUTH RINSE
15.0000 mL | Freq: Two times a day (BID) | OROMUCOSAL | Status: DC
  Administered 2021-01-25 – 2021-02-10 (×27): 15 mL via OROMUCOSAL

## 2021-01-25 MED ORDER — ATORVASTATIN CALCIUM 80 MG PO TABS: 80.0000 mg | ORAL_TABLET | Freq: Every day | ORAL | Status: DC

## 2021-01-25 MED ORDER — BISACODYL 10 MG RE SUPP
10.0000 mg | Freq: Every day | RECTAL | Status: DC | PRN
  Administered 2021-01-29 – 2021-02-01 (×2): 10 mg via RECTAL
  Filled 2021-01-25 (×3): qty 1

## 2021-01-25 MED ORDER — OXYCODONE-ACETAMINOPHEN 5-325 MG PO TABS
1.0000 | ORAL_TABLET | ORAL | Status: DC | PRN
  Administered 2021-01-29 – 2021-02-05 (×2): 1 via ORAL
  Administered 2021-02-10: 2 via ORAL
  Filled 2021-01-25: qty 2
  Filled 2021-01-25 (×2): qty 1

## 2021-01-25 MED ORDER — PROSOURCE PLUS PO LIQD
30.0000 mL | Freq: Two times a day (BID) | ORAL | Status: DC
  Administered 2021-01-26 – 2021-02-10 (×25): 30 mL via ORAL
  Filled 2021-01-25 (×24): qty 30

## 2021-01-25 MED ORDER — ATORVASTATIN CALCIUM 80 MG PO TABS
80.0000 mg | ORAL_TABLET | Freq: Every day | ORAL | Status: DC
  Administered 2021-01-25 – 2021-02-10 (×17): 80 mg via ORAL
  Filled 2021-01-25 (×18): qty 1

## 2021-01-25 MED ORDER — AMIODARONE HCL 200 MG PO TABS
200.0000 mg | ORAL_TABLET | Freq: Every day | ORAL | Status: DC
  Administered 2021-01-26 – 2021-02-11 (×17): 200 mg via ORAL
  Filled 2021-01-25 (×17): qty 1

## 2021-01-25 MED ORDER — KIDNEY FAILURE BOOK: Freq: Once | Status: AC

## 2021-01-25 MED ORDER — INSULIN ASPART 100 UNIT/ML IJ SOLN
0.0000 [IU] | Freq: Three times a day (TID) | INTRAMUSCULAR | Status: DC
  Administered 2021-01-26: 2 [IU] via SUBCUTANEOUS
  Administered 2021-01-27: 8 [IU] via SUBCUTANEOUS
  Administered 2021-01-27: 2 [IU] via SUBCUTANEOUS
  Administered 2021-01-27: 3 [IU] via SUBCUTANEOUS
  Administered 2021-01-28: 2 [IU] via SUBCUTANEOUS
  Administered 2021-01-29: 3 [IU] via SUBCUTANEOUS
  Administered 2021-01-29 (×2): 2 [IU] via SUBCUTANEOUS
  Administered 2021-01-30 (×2): 3 [IU] via SUBCUTANEOUS
  Administered 2021-01-31 – 2021-02-01 (×5): 2 [IU] via SUBCUTANEOUS
  Administered 2021-02-02: 18:00:00 5 [IU] via SUBCUTANEOUS
  Administered 2021-02-03: 2 [IU] via SUBCUTANEOUS
  Administered 2021-02-03 – 2021-02-04 (×2): 3 [IU] via SUBCUTANEOUS
  Administered 2021-02-04: 1 [IU] via SUBCUTANEOUS
  Administered 2021-02-05 – 2021-02-06 (×4): 2 [IU] via SUBCUTANEOUS
  Administered 2021-02-06: 5 [IU] via SUBCUTANEOUS
  Administered 2021-02-06: 3 [IU] via SUBCUTANEOUS
  Administered 2021-02-07: 2 [IU] via SUBCUTANEOUS
  Administered 2021-02-07: 5 [IU] via SUBCUTANEOUS
  Administered 2021-02-08 – 2021-02-09 (×3): 2 [IU] via SUBCUTANEOUS
  Administered 2021-02-09: 6 [IU] via SUBCUTANEOUS
  Administered 2021-02-10: 2 [IU] via SUBCUTANEOUS
  Administered 2021-02-10: 3 [IU] via SUBCUTANEOUS
  Administered 2021-02-11: 2 [IU] via SUBCUTANEOUS

## 2021-01-25 MED ORDER — GABAPENTIN 300 MG PO CAPS
300.0000 mg | ORAL_CAPSULE | Freq: Every day | ORAL | Status: DC
  Administered 2021-01-25 – 2021-02-10 (×17): 300 mg via ORAL
  Filled 2021-01-25 (×18): qty 1

## 2021-01-25 MED ORDER — ASPIRIN 81 MG PO TBEC: 81.0000 mg | DELAYED_RELEASE_TABLET | Freq: Every morning | ORAL | 0 refills | Status: DC

## 2021-01-25 MED ORDER — CHLORHEXIDINE GLUCONATE CLOTH 2 % EX PADS
6.0000 | MEDICATED_PAD | Freq: Every day | CUTANEOUS | Status: DC
  Administered 2021-01-26 – 2021-02-07 (×12): 6 via TOPICAL

## 2021-01-25 MED ORDER — AMIODARONE HCL 200 MG PO TABS: 200.0000 mg | ORAL_TABLET | Freq: Every day | ORAL | Status: DC

## 2021-01-25 MED ORDER — RYBELSUS 3 MG PO TABS: 3.0000 mg | ORAL_TABLET | Freq: Every day | ORAL | 0 refills | Status: AC

## 2021-01-25 MED ORDER — INSULIN ASPART 100 UNIT/ML IJ SOLN
4.0000 [IU] | Freq: Three times a day (TID) | INTRAMUSCULAR | Status: DC
  Administered 2021-01-26 – 2021-02-11 (×34): 4 [IU] via SUBCUTANEOUS

## 2021-01-25 MED ORDER — RENA-VITE PO TABS
1.0000 | ORAL_TABLET | Freq: Every day | ORAL | Status: DC
  Administered 2021-01-25 – 2021-02-10 (×17): 1 via ORAL
  Filled 2021-01-25 (×18): qty 1

## 2021-01-25 MED ORDER — PROCHLORPERAZINE MALEATE 5 MG PO TABS
5.0000 mg | ORAL_TABLET | Freq: Four times a day (QID) | ORAL | Status: DC | PRN
  Administered 2021-01-25 – 2021-02-11 (×4): 10 mg via ORAL
  Filled 2021-01-25 (×5): qty 2

## 2021-01-25 NOTE — Evaluation (Signed)
Occupational Therapy Assessment and Plan  Patient Details  Name: [name] MRN: [id number] Date of Birth: 1950-11-11  OT Diagnosis: abnormal posture, hemiplegia affecting dominant side, muscle weakness (generalized), and swelling of limb Rehab Potential: Rehab Potential (ACUTE ONLY): Good ELOS: 12-14 days   Today's Date: 01/26/2021 OT Individual Time: 3785-8850 OT Individual Time Calculation (min): 55 min     Hospital Problem: Principal Problem:   Debility Active Problems:   Left pontine stroke [facility])   Past Medical History:  Past Medical History:  Diagnosis Date   CHF (congestive heart failure) ([place])    Chronic Kidney Disease IV    Exertional shortness of breath    "before my last OR; I'm fine now" (09/26/2012)   GERD (gastroesophageal reflux disease)    High cholesterol    Hx of seasonal allergies    Hypertension    Myocardial infarction ([place])    Scrotal edema 01/30/2019   Stroke ([place]) 2009   memory loss   Type II diabetes mellitus ([place])    Past Surgical History:  Past Surgical History:  Procedure Laterality Date   AMPUTATION Right 07/29/2012   Procedure: AMPUTATION 3RD TOE;  Surgeon: Kerin Salen, MD;  Location: [place];  Service: Orthopedics;  Laterality: Right;  right third toe amputation   AMPUTATION Right 08/02/2012   Procedure: right transmetatarsal amputation;  Surgeon: Kerin Salen, MD;  Location: [place];  Service: Orthopedics;  Laterality: Right;   AMPUTATION Right 09/06/2012   Procedure: AMPUTATION BELOW KNEE;  Surgeon: Kerin Salen, MD;  Location: [place];  Service: Orthopedics;  Laterality: Right;   AMPUTATION Left 09/27/2012   Procedure: AMPUTATION BELOW KNEE;  Surgeon: Kerin Salen, MD;  Location: [place];  Service: Orthopedics;  Laterality: Left;   AV FISTULA PLACEMENT Left 01/19/2021   Procedure: ARTERIOVENOUS (AV) FISTULA OF LEFT ARM  USING GRAFT;  Surgeon: Angelia Mould, MD;  Location: [place];  Service: Vascular;  Laterality: Left;   CATARACT  EXTRACTION W/ INTRAOCULAR LENS  IMPLANT, BILATERAL  ?2010   CORONARY ARTERY BYPASS GRAFT  2008   CABG X4   ESOPHAGOGASTRODUODENOSCOPY N/A 10/08/2012   Procedure: ESOPHAGOGASTRODUODENOSCOPY (EGD);  Surgeon: Winfield Cunas., MD;  Location: [facility];  Service: Endoscopy;  Laterality: N/A;   IR FLUORO GUIDE CV LINE RIGHT  01/14/2021   IR US GUIDE VASC ACCESS RIGHT  01/14/2021   PERIPHERALLY INSERTED CENTRAL CATHETER INSERTION Right 09/2012   upper arm    Assessment & Plan Clinical Impression:  Rishawn L. Masoud is a 70 year old male with history of HTN, CAD s/p CABG, CVA w/STM deficits, T2DM, B-BKAs, CKD IV who has refused HD in the past and was admitted on 01/11/21 with N/V and bilateral LE edema due to acute on chronic CHF. History taken from chart review and patient. Echocardiogram with EF of 30-35% and global hypokinesis. He was evaluated by Dr. Terrence Dupont and was started on IV lasix for diuresis.  EKG changes felt to be due to NSVT and was started on amiodarone for rate control.  Elevated cardiac enzymes felt to be due to demand ischemia and patient with cardiorenal syndrome with symptoms of uremia. HD initiated and AVG placed by Dr. Scot Dock on 01/19/21 and post op has had edema RUE but without numbness or tingling.     He was scheduled for discharge but became unresponsive on 01/22/21, was incontinent of B/B and confused when aroused. Dr. Cheral Marker consulted due to concerns of seizure activity and exam revealed asterixis  likely due to metabolic derangements and questioned seizure due to low calcium. MRI brain done for work up and showed small acute left pontine infarct. 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     EEG was suggestive of mild diffuse encephalopathy without seizure or epileptiform discharges.    Stroke felt to be due to small/large vessel disease but cardioembolic source not ruled out. Dr. Erlinda Hong recommended ASA/plavix X 3 weeks followed by Plavix alone as well as 30 day  cardiac event monitor to rule out A fib. . Patient with limitations in mobility and ADLs. CIR recommended due to functional decline. Please see preadmission assessment from earlier today as well.   Patient currently requires mod with basic self-care skills secondary to muscle weakness, decreased cardiorespiratoy endurance and decreased oxygen support, decreased coordination, and decreased sitting balance, decreased postural control, hemiplegia, and decreased balance strategies.  Prior to hospitalization, patient could complete BADLs with modified independent .  Patient will benefit from skilled intervention to increase independence with basic self-care skills prior to discharge home with care partner.  Anticipate patient will require 24 hour supervision and minimal physical assistance and follow up home health.  OT - End of Session Endurance Deficit: Yes Endurance Deficit Description: Pt able to tolerate little time sitting EOB during UB self care OT Assessment Rehab Potential (ACUTE ONLY): Good OT Barriers to Discharge: Hemodialysis;New oxygen OT Patient demonstrates impairments in the following area(s): Balance;Edema;Endurance;Motor;Pain;Safety OT Basic ADL's Functional Problem(s): Grooming;Bathing;Dressing;Toileting OT Advanced ADL's Functional Problem(s): Simple Meal Preparation OT Transfers Functional Problem(s): Toilet;Tub/Shower OT Additional Impairment(s): Fuctional Use of Upper Extremity OT Plan OT Intensity: Minimum of 1-2 x/day, 45 to 90 minutes OT Frequency: 5 out of 7 days OT Duration/Estimated Length of Stay: 12-14 days OT Treatment/Interventions: Balance/vestibular training;Disease mangement/prevention;Neuromuscular re-education;Self Care/advanced ADL retraining;Therapeutic Exercise;Wheelchair propulsion/positioning;UE/LE Strength taining/ROM;Skin care/wound managment;Pain management;DME/adaptive equipment instruction;Cognitive remediation/compensation;Community  reintegration;Patient/family education;UE/LE Coordination activities;Discharge planning;Functional mobility training;Psychosocial support;Therapeutic Activities OT Self Feeding Anticipated Outcome(s): No goal OT Basic Self-Care Anticipated Outcome(s): Min A OT Toileting Anticipated Outcome(s): Min A OT Bathroom Transfers Anticipated Outcome(s): Min A OT Recommendation Recommendations for Other Services: Neuropsych consult Patient destination: Home Follow Up Recommendations: Home health OT Equipment Recommended: To be determined   OT Evaluation Precautions/Restrictions  Precautions Precautions: Fall;Other (comment) Precaution Comments: bilateral BKA Required Braces or Orthoses: Other Brace Other Brace: Bilateral prostheses Pain Pain Assessment Pain Scale: 0-10 Pain Score: 0-No pain Home Living/Prior Functioning Home Living Family/patient expects to be discharged to:: Private residence Living Arrangements: Other (Comment) (ex-wife) Available Help at Discharge: Family Type of Home: House Home Access: Stairs to enter Technical brewer of Steps: 4 Entrance Stairs-Rails: Right Home Layout: One level Bathroom Shower/Tub: Other (comment) (sponge bathes) Bathroom Toilet: Standard Bathroom Accessibility: Yes Additional Comments: reports his w/c arm is broken, gave his BSC away  Lives With: Other (Comment) (ex wife) IADL History Homemaking Responsibilities: Yes Meal Prep Responsibility: Primary Laundry Responsibility: Primary Cleaning Responsibility: Primary Bill Paying/Finance Responsibility: Primary Shopping Responsibility: Primary Homemaking Comments: Pt reported being Mod I with all IADL activity including medication management, has more active role in the house than ex wife Occupation: Retired Type of Occupation: Games developer Leisure and Hobbies: playing football and basketball Prior Function Level of Independence: Independent with basic ADLs, Independent with  homemaking with ambulation, Independent with gait, Independent with transfers, Requires assistive device for independence Driving: Yes Vision Baseline Vision/History: 1 Wears glasses (distance only per pt) Ability to See in Adequate Light: 1 Impaired Patient Visual Report: No change from baseline Perception  Perception: Within Functional Limits Praxis Praxis: Intact Cognition Overall Cognitive Status: No family/caregiver present to determine baseline cognitive functioning Arousal/Alertness: Awake/alert Orientation Level: Person;Place Year: 2022 Month: November Day of Week: Correct Immediate Memory Recall: Sock;Blue;Bed Memory Recall Sock: Without Cue Memory Recall Blue: Without Cue Memory Recall Bed: Without Cue Sensation Sensation Light Touch: Appears Intact Coordination Gross Motor Movements are Fluid and Coordinated: Yes Fine Motor Movements are Fluid and Coordinated: No Finger Nose Finger Test: dysmetria in end ranges bilaterally Lt>Rt Heel Shin Test: NT Motor  Motor Motor: Other (comment) Motor - Skilled Clinical Observations: mild L hemipareisis, slow and methodical fine motor movements  Trunk/Postural Assessment  Cervical Assessment Cervical Assessment: Exceptions to [facility] (forward head) Thoracic Assessment Thoracic Assessment: Exceptions to [facility] (rounded shoulders) Lumbar Assessment Lumbar Assessment: Exceptions to [facility] (posterior pelvic tilt) Postural Control Postural Control: Deficits on evaluation (pt losing balance backwards during functional bed mobility)  Balance Balance Balance Assessed: Yes Dynamic Sitting Balance Dynamic Sitting - Balance Support: No upper extremity supported Dynamic Sitting - Level of Assistance: 4: Min assist Dynamic Sitting - Balance Activities: Lateral lean/weight shifting;Forward lean/weight shifting (donning button up shirt) Extremity/Trunk Assessment RUE Assessment RUE Assessment: Within Functional Limits Active Range of Motion  (AROM) Comments: Shoulder ROM limited ~150 degrees LUE Assessment LUE Assessment: Exceptions to [facility] Active Range of Motion (AROM) Comments: Shoulder ROM <90 degrees, edemateous  Care Tool Care Tool Self Care Eating    Not assessed    Oral Care    Oral Care Assist Level: Minimal Assistance - Patient > 75%    Bathing   Body parts bathed by patient: Right arm;Left arm;Chest;Abdomen;Front perineal area;Right upper leg;Left upper leg;Face Body parts bathed by helper: Buttocks Body parts n/a: Right lower leg;Left lower leg Assist Level: Minimal Assistance - Patient > 75%    Upper Body Dressing(including orthotics)   What is the patient wearing?: Button up shirt   Assist Level: Moderate Assistance - Patient 50 - 74%    Lower Body Dressing (excluding footwear) Lower body dressing activity did not occur: Refused        Putting on/Taking off footwear Putting on/taking off footwear activity did not occur: N/A (pt B BKA)           Care Tool Toileting Toileting activity Toileting Activity did not occur (Clothing management and hygiene only): N/A (no void or bm)          Toilet transfer Toilet transfer activity did not occur: N/A (not assessed due to time constraints)       Care Tool Cognition  Expression of Ideas and Wants Expression of Ideas and Wants: 4. Without difficulty (complex and basic) - expresses complex messages without difficulty and with speech that is clear and easy to understand  Understanding Verbal and Non-Verbal Content Understanding Verbal and Non-Verbal Content: 4. Understands (complex and basic) - clear comprehension without cues or repetitions   Memory/Recall Ability     Refer to Care Plan for Long Term Goals  SHORT TERM GOAL WEEK 1 OT Short Term Goal 1 (Week 1): PT will complete BSC or toilet transfer with 1 assist and LRAD OT Short Term Goal 2 (Week 1): Pt will don B prosthetics with supervision assistance OT Short Term Goal 3 (Week 1): Pt will sit  EOB ~20 minutes during self care activity before returning to bed to rest in order to increase functional activity tolerance  Recommendations for other services: Neuropsych   Skilled Therapeutic Intervention Skilled OT session completed with focus on initial  evaluation, education on OT role/POC, and establishment of patient-centered goals.   Pt greeted in bed with no c/o pain, on 2L 02 and satting at 100%. He was agreeable to session, needed Mod A to transition to EOB. Note that pt fatigued very quickly sitting up, required encouragement to first complete UB self care before lying back down again. Per pt, "you're workin' me today." He required Mod A and cues to fasten his button up shirt. Rolling Rt>Lt with Mod A to complete perihygiene. Pt reported that he did not need to wear brief and very adamant that he needed to don his pants only after donning his bilateral prosthetics. OT asked if he could don pants and doff them again for evaluation purposes and pt stating "no, that's not how I do it." He stated that he transfers to the w/c and then don pants + prosthetics at home. No w/c present in his room so unable to assess LB dressing due to pt refusal/lack of equipment. Min A to complete denture care bedlevel with HOB raised. With presentation of oral care setup pt stated "go ahead, you do it." Needed to re-educate pt regarding role of OT and POC. Note that pt is quite particular regarding setup and overall care. 02 sats 100% when assessed during tx and at close of session as well, all needs within reach and bed alarm set at time of OT departure.   ADL ADL Eating: Not assessed Grooming: Minimal assistance Where Assessed-Grooming: Edge of bed Upper Body Bathing: Supervision/safety Where Assessed-Upper Body Bathing: Edge of bed Lower Body Bathing: Minimal assistance Where Assessed-Lower Body Bathing: Bed level Upper Body Dressing: Moderate assistance Where Assessed-Upper Body Dressing: Edge of  bed Lower Body Dressing: Not assessed Toileting: Not assessed Toilet Transfer: Not assessed Tub/Shower Transfer: Not assessed Mobility  Bed Mobility Bed Mobility: Supine to Sit;Sitting - Scoot to Edge of Bed Supine to Sit: Contact Guard/Touching assist Sitting - Scoot to Edge of Bed: Moderate Assistance - Patient 50-74% Transfers Sit to Stand: Maximal Assistance - Patient 25-49%;2 Helpers Stand to Sit: Maximal Assistance - Patient 25-49%   Discharge Criteria: Patient will be discharged from OT if patient refuses treatment 3 consecutive times without medical reason, if treatment goals not met, if there is a change in medical status, if patient makes no progress towards goals or if patient is discharged from hospital.  The above assessment, treatment plan, treatment alternatives and goals were discussed and mutually agreed upon: by patient  Skeet Simmer 01/26/2021, 1:01 PM

## 2021-01-25 NOTE — H&P (Signed)
Physical Medicine and Rehabilitation Admission H&P    Chief Complaint  Patient presents with   Functional decline.     HPI: [name] is a 70 year old male with history of HTN, CAD s/p CABG, CVA w/STM deficits, T2DM, B-BKAs, CKD IV who has refused HD in the past and was admitted on 01/11/21 with N/V and bilateral LE edema due to acute on chronic CHF. History taken from chart review and patient. Echocardiogram with EF of 30-35% and global hypokinesis. He was evaluated by Dr. Terrence Dupont and was started on IV lasix for diuresis.  EKG changes felt to be due to NSVT and was started on amiodarone for rate control.  Elevated cardiac enzymes felt to be due to demand ischemia and patient with cardiorenal syndrome with symptoms of uremia. HD initiated and AVG placed by Dr. Scot Dock on 01/19/21 and post op has had edema RUE but without numbness or tingling.    He was scheduled for discharge but became unresponsive on 01/22/21, was incontinent of B/B and confused when aroused. Dr. Cheral Marker consulted due to concerns of seizure activity and exam revealed asterixis likely due to metabolic derangements and questioned seizure due to low calcium. MRI brain done for work up and showed small acute left pontine infarct. 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    EEG was suggestive of mild diffuse encephalopathy without seizure or epileptiform discharges.   Stroke felt to be due to small/large vessel disease but cardioembolic source not ruled out. Dr. Erlinda Hong recommended ASA/plavix X 3 weeks followed by Plavix alone as well as 30 day cardiac event monitor to rule out A fib. . Patient with limitations in mobility and ADLs. CIR recommended due to functional decline. Please see preadmission assessment from earlier today as well.    Review of Systems  Constitutional:  Positive for malaise/fatigue.  Respiratory:  Positive for shortness of breath.   Musculoskeletal:  Positive for joint pain and  myalgias.  Neurological:  Positive for weakness.  All other systems reviewed and are negative.   Past Medical History:  Diagnosis Date   CHF (congestive heart failure) (HCC)    Chronic Kidney Disease IV    Exertional shortness of breath    "before my last OR; I'm fine now" (09/26/2012)   GERD (gastroesophageal reflux disease)    High cholesterol    Hx of seasonal allergies    Hypertension    Myocardial infarction [facility])    Scrotal edema 01/30/2019   Stroke ([place]) 2009   memory loss   Type II diabetes mellitus ([place])     Past Surgical History:  Procedure Laterality Date   AMPUTATION Right 07/29/2012   Procedure: AMPUTATION 3RD TOE;  Surgeon: Kerin Salen, MD;  Location: [place];  Service: Orthopedics;  Laterality: Right;  right third toe amputation   AMPUTATION Right 08/02/2012   Procedure: right transmetatarsal amputation;  Surgeon: Kerin Salen, MD;  Location: [place];  Service: Orthopedics;  Laterality: Right;   AMPUTATION Right 09/06/2012   Procedure: AMPUTATION BELOW KNEE;  Surgeon: Kerin Salen, MD;  Location: [place];  Service: Orthopedics;  Laterality: Right;   AMPUTATION Left 09/27/2012   Procedure: AMPUTATION BELOW KNEE;  Surgeon: Kerin Salen, MD;  Location: [place];  Service: Orthopedics;  Laterality: Left;   AV FISTULA PLACEMENT Left 01/19/2021   Procedure: ARTERIOVENOUS (AV) FISTULA OF LEFT ARM  USING GRAFT;  Surgeon: Angelia Mould, MD;  Location: [place];  Service: Vascular;  Laterality:  Left;   CATARACT EXTRACTION W/ INTRAOCULAR LENS  IMPLANT, BILATERAL  ?2010   CORONARY ARTERY BYPASS GRAFT  2008   CABG X4   ESOPHAGOGASTRODUODENOSCOPY N/A 10/08/2012   Procedure: ESOPHAGOGASTRODUODENOSCOPY (EGD);  Surgeon: Winfield Cunas., MD;  Location: [facility] ENDOSCOPY;  Service: Endoscopy;  Laterality: N/A;   IR FLUORO GUIDE CV LINE RIGHT  01/14/2021   IR US GUIDE VASC ACCESS RIGHT  01/14/2021   PERIPHERALLY INSERTED CENTRAL CATHETER INSERTION Right 09/2012   upper arm    History  reviewed. No pertinent family history.   Social History:  reports that he has quit smoking. His smoking use included cigarettes. He has a 2.40 pack-year smoking history. He has never used smokeless tobacco. He reports that he does not drink alcohol and does not use drugs.   Allergies: No Known Allergies   Medications Prior to Admission  Medication Sig Dispense Refill   amiodarone (PACERONE) 200 MG tablet Take 1 tablet (200 mg total) by mouth daily. Hold if HR < 55     aspirin EC 81 MG EC tablet Take 1 tablet (81 mg total) by mouth every morning for 21 days. Swallow whole. 21 tablet 0   atorvastatin (LIPITOR) 80 MG tablet Take 1 tablet (80 mg total) by mouth at bedtime.     clopidogrel (PLAVIX) 75 MG tablet Take 1 tablet (75 mg total) by mouth daily.     Cyanocobalamin (VITAMIN B-12 PO) Take 1 tablet by mouth daily.     gabapentin (NEURONTIN) 300 MG capsule Take 1 capsule (300 mg total) by mouth at bedtime. 90 capsule 0   nitroGLYCERIN (NITROSTAT) 0.4 MG SL tablet Place 0.4 mg under the tongue every 5 (five) minutes as needed for chest pain.     pantoprazole (PROTONIX) 40 MG tablet Take 1 tablet (40 mg total) by mouth daily at 6 (six) AM. 30 tablet 3   Semaglutide (RYBELSUS) 3 MG TABS Take 1 tablet (3 mg) by mouth daily. 30 tablet 0   sodium bicarbonate 650 MG tablet Take 650 mg by mouth 2 (two) times daily.      Drug Regimen Review  Drug regimen was reviewed and remains appropriate with no significant issues identified  Home: Home Living Family/patient expects to be discharged to:: Private residence Living Arrangements: Other (Comment) (ex wife) Available Help at Discharge: Family Type of Home: House Home Access: Stairs to enter Technical brewer of Steps: 4 Entrance Stairs-Rails: Right Home Layout: One level Bathroom Shower/Tub: Other (comment) (sponges bathes at sink) Biochemist, clinical: Standard Home Equipment: Conservation officer, nature (2 wheels), [employer] - single point, Wheelchair -  manual Additional Comments: reports his w/c arm is broken, gave his BSC away   Functional History: Prior Function Prior Level of Function : Independent/Modified Independent, Driving Mobility Comments: Modified independent uses prosthetics and cane ADLs Comments: sponge bathes in standing at sink  Functional Status:  Mobility: Bed Mobility Overal bed mobility: Needs Assistance Bed Mobility: Rolling, Supine to Sit, Sit to Supine Rolling: Mod assist Supine to sit: Mod assist, HOB elevated Sit to supine: Mod assist General bed mobility comments: modA to complete rolls, elevate trunk, and return BLE to bed. the pt needed sequential cues to complete movements and to assist with BUE. able to assist with boosting in bed when directly cued for use of BUE on bed rails Transfers Overall transfer level: Needs assistance Equipment used:  (bed pad) Transfers: Bed to chair/wheelchair/BSC Bed to/from chair/wheelchair/BSC transfer type:: Lateral/scoot transfer Anterior-Posterior transfers: Max assist, +2 physical assistance  Lateral/Scoot  Transfers: Max assist, From elevated surface General transfer comment: unable to achieve any movement with lateral scoot transfer. The pt did not follow commands to assist with movement and could not produce any movement on his own, fatigued after 10 min sitting EOB Ambulation/Gait General Gait Details: Unable due to LE's too swollen for prosthetics to fit    ADL: ADL Overall ADL's : Needs assistance/impaired Eating/Feeding: Minimal assistance, Sitting Eating/Feeding Details (indicate cue type and reason): assist to open containers Grooming: Wash/dry hands, Sitting, Set up Grooming Details (indicate cue type and reason): performed in recliner Upper Body Bathing: Set up, Sitting Lower Body Bathing: Sitting/lateral leans, Maximal assistance Upper Body Dressing : Set up, Sitting Lower Body Dressing: Sitting/lateral leans, Maximal assistance Toilet Transfer: +2  for physical assistance, Minimal assistance (lateral transfer) Toileting- Clothing Manipulation and Hygiene: Moderate assistance, Sitting/lateral lean General ADL Comments: only agreeable to grooming this am  Cognition: Cognition Overall Cognitive Status: Impaired/Different from baseline Orientation Level: Oriented X4 Cognition Arousal/Alertness: Awake/alert Behavior During Therapy: WFL for tasks assessed/performed Overall Cognitive Status: Impaired/Different from baseline Area of Impairment: Safety/judgement, Awareness, Following commands, Problem solving Following Commands: Follows one step commands inconsistently, Follows one step commands with increased time Safety/Judgement: Decreased awareness of safety, Decreased awareness of deficits Awareness: Emergent Problem Solving: Slow processing, Decreased initiation, Difficulty sequencing, Requires verbal cues General Comments: pt oriented, but asking to hold on therapy until after he got his strength back. pt needing cues to complete all sequencing for bed mobility and movements. once sitting EOB, pt with decrased command following and decreased awareness of positioning.   Blood pressure (!) 99/55, pulse 65, temperature 98.4 F (36.9 C), temperature source Oral, resp. rate 13, height 5\' 8"  (1.727 m), weight 104.5 kg, SpO2 99 %. Physical Exam Vitals reviewed.  Constitutional:      Appearance: He is obese.  HENT:     Head: Normocephalic and atraumatic.     Right Ear: External ear normal.     Left Ear: External ear normal.     Nose: Nose normal.  Eyes:     General:        Right eye: No discharge.        Left eye: No discharge.     Extraocular Movements: Extraocular movements intact.  Cardiovascular:     Rate and Rhythm: Normal rate and regular rhythm.  Pulmonary:     Effort: Pulmonary effort is normal. No respiratory distress.     Breath sounds: No stridor.     Comments: +[place] Abdominal:     General: There is no distension.      Palpations: Abdomen is soft.  Musculoskeletal:     Cervical back: Normal range of motion and neck supple.     Comments: B/l BKA with mild tenderness, mild edema  Skin:    General: Skin is warm and dry.  Neurological:     Mental Status: He is alert.     Comments: Alert Dysarthria Motor: Grossly 4/5, left weaker than right  Psychiatric:        Mood and Affect: Mood normal.        Behavior: Behavior normal.    Results for orders placed or performed during the hospital encounter of 01/11/21 (from the past 48 hour(s))  Glucose, capillary     Status: Abnormal   Collection Time: 01/23/21  8:49 PM  Result Value Ref Range   Glucose-Capillary 208 (H) 70 - 99 mg/dL    Comment: Glucose reference range applies only to samples taken after  fasting for at least 8 hours.  Comprehensive metabolic panel     Status: Abnormal   Collection Time: 01/24/21  1:51 AM  Result Value Ref Range   Sodium 130 (L) 135 - 145 mmol/L   Potassium 4.4 3.5 - 5.1 mmol/L   Chloride 94 (L) 98 - 111 mmol/L   CO2 24 22 - 32 mmol/L   Glucose, Bld 232 (H) 70 - 99 mg/dL    Comment: Glucose reference range applies only to samples taken after fasting for at least 8 hours.   BUN 53 (H) 8 - 23 mg/dL   Creatinine, Ser 6.39 (H) 0.61 - 1.24 mg/dL   Calcium 8.1 (L) 8.9 - 10.3 mg/dL   Total Protein 5.6 (L) 6.5 - 8.1 g/dL   Albumin 2.6 (L) 3.5 - 5.0 g/dL   AST 70 (H) 15 - 41 U/L   ALT 20 0 - 44 U/L   Alkaline Phosphatase 82 38 - 126 U/L   Total Bilirubin 2.3 (H) 0.3 - 1.2 mg/dL   GFR, Estimated 9 (L) >60 mL/min    Comment: (NOTE) Calculated using the CKD-EPI Creatinine Equation (2021)    Anion gap 12 5 - 15    Comment: Performed at [facility], [place] [address] [place], [place] [address]  CBC     Status: Abnormal   Collection Time: 01/24/21  1:51 AM  Result Value Ref Range   WBC 12.5 (H) 4.0 - 10.5 K/uL   RBC 2.77 (L) 4.22 - 5.81 MIL/uL   Hemoglobin 8.0 (L) 13.0 - 17.0 g/dL   HCT 25.4 (L) 39.0 - 52.0 %   MCV 91.7  80.0 - 100.0 fL   MCH 28.9 26.0 - 34.0 pg   MCHC 31.5 30.0 - 36.0 g/dL   RDW 17.5 (H) 11.5 - 15.5 %   Platelets 155 150 - 400 K/uL   nRBC 0.8 (H) 0.0 - 0.2 %    Comment: Performed at [facility] [address] [place], [place] [address]  Glucose, capillary     Status: Abnormal   Collection Time: 01/24/21  6:30 AM  Result Value Ref Range   Glucose-Capillary 244 (H) 70 - 99 mg/dL    Comment: Glucose reference range applies only to samples taken after fasting for at least 8 hours.  Glucose, capillary     Status: Abnormal   Collection Time: 01/24/21 12:46 PM  Result Value Ref Range   Glucose-Capillary 138 (H) 70 - 99 mg/dL    Comment: Glucose reference range applies only to samples taken after fasting for at least 8 hours.  Glucose, capillary     Status: Abnormal   Collection Time: 01/24/21  3:41 PM  Result Value Ref Range   Glucose-Capillary 157 (H) 70 - 99 mg/dL    Comment: Glucose reference range applies only to samples taken after fasting for at least 8 hours.  Glucose, capillary     Status: Abnormal   Collection Time: 01/24/21  6:03 PM  Result Value Ref Range   Glucose-Capillary 186 (H) 70 - 99 mg/dL    Comment: Glucose reference range applies only to samples taken after fasting for at least 8 hours.  Glucose, capillary     Status: Abnormal   Collection Time: 01/24/21  8:54 PM  Result Value Ref Range   Glucose-Capillary 140 (H) 70 - 99 mg/dL    Comment: Glucose reference range applies only to samples taken after fasting for at least 8 hours.  CBC  Status: Abnormal   Collection Time: 01/25/21  1:49 AM  Result Value Ref Range   WBC 12.5 (H) 4.0 - 10.5 K/uL   RBC 2.59 (L) 4.22 - 5.81 MIL/uL   Hemoglobin 7.6 (L) 13.0 - 17.0 g/dL   HCT 24.0 (L) 39.0 - 52.0 %   MCV 92.7 80.0 - 100.0 fL   MCH 29.3 26.0 - 34.0 pg   MCHC 31.7 30.0 - 36.0 g/dL   RDW 18.0 (H) 11.5 - 15.5 %   Platelets 165 150 - 400 K/uL   nRBC 0.7 (H) 0.0 - 0.2 %    Comment: Performed at [facility] [address], [place], [place] [address]  Basic metabolic panel     Status: Abnormal   Collection Time: 01/25/21  1:49 AM  Result Value Ref Range   Sodium 133 (L) 135 - 145 mmol/L   Potassium 3.8 3.5 - 5.1 mmol/L   Chloride 96 (L) 98 - 111 mmol/L   CO2 26 22 - 32 mmol/L   Glucose, Bld 168 (H) 70 - 99 mg/dL    Comment: Glucose reference range applies only to samples taken after fasting for at least 8 hours.   BUN 31 (H) 8 - 23 mg/dL   Creatinine, Ser 4.56 (H) 0.61 - 1.24 mg/dL   Calcium 8.3 (L) 8.9 - 10.3 mg/dL   GFR, Estimated 13 (L) >60 mL/min    Comment: (NOTE) Calculated using the CKD-EPI Creatinine Equation (2021)    Anion gap 11 5 - 15    Comment: Performed at [facility] [address] [place], [place] [address]  Glucose, capillary     Status: Abnormal   Collection Time: 01/25/21  7:13 AM  Result Value Ref Range   Glucose-Capillary 140 (H) 70 - 99 mg/dL    Comment: Glucose reference range applies only to samples taken after fasting for at least 8 hours.  Glucose, capillary     Status: Abnormal   Collection Time: 01/25/21 11:03 AM  Result Value Ref Range   Glucose-Capillary 180 (H) 70 - 99 mg/dL    Comment: Glucose reference range applies only to samples taken after fasting for at least 8 hours.   EEG adult  Result Date: 01/23/2021 Lora Havens, MD     01/23/2021  5:33 PM Patient Name: [name] MRN: [id number] Epilepsy Attending: Lora Havens Referring Physician/Provider: Dr Kerney Elbe Date: 01/23/2021 Duration: 22.53 mins Patient history: 70yo M with ams, tongue bite. EEG to evaluate for seizure. Level of alertness: Awake, asleep AEDs during EEG study: GBP Technical aspects: This EEG study was done with scalp electrodes positioned according to the 10-20 International system of electrode placement. Electrical activity was acquired at a sampling rate of 500Hz  and reviewed with a high frequency filter of 70Hz  and a low frequency filter of 1Hz .  EEG data were recorded continuously and digitally stored. Description: The posterior dominant rhythm consists of 8 Hz activity of moderate voltage (25-35 uV) seen predominantly in posterior head regions, symmetric and reactive to eye opening and eye closing. Sleep was characterized by vertex waves, sleep spindles (12 to 14 Hz), maximal frontocentral region. EEG showed intermittent generalized 3 to 6 Hz theta-delta slowing. Hyperventilation and photic stimulation were not performed.   ABNORMALITY - Intermittent slow, generalized IMPRESSION: This study is suggestive of mild diffuse encephalopathy, nonspecific etiology. No seizures or epileptiform discharges were seen throughout the recording. Lora Havens       Medical Problem List  and Plan: 1.  Deficits with mobility, self-care, transfers, endurance secondary to debility.  -patient may not shower  -ELOS/Goals: 10-14 days/Min A  Admit to CIR 2.  Antithrombotics: -DVT/anticoagulation:  Mechanical: Sequential compression devices, below knee Bilateral lower extremities  -antiplatelet therapy: DAPT X 3 weeks followed by ASA alone.  3. Pain Management: Tylenol prn.  4. Mood: LCSW to follow for evaluation and support.   -antipsychotic agents: N/A 5. Neuropsych: This patient is ?fully capable of making decisions on his own behalf. 6. Skin/Wound Care: Routine pressure relief measures.  7. Fluids/Electrolytes/Nutrition: Strict I/O. 1200 cc FR. Renal diet' 8. New ESRD: On HD TTS --schedule at the end of the day to help with tolerance of therapy  --Daily weight 9. Demand ischemia/NSVT: On amiodarone daily 10. Thrombocytopenia: Platelets recovering from drop to 76-->108 CBC ordered for tomorrow 11. Anemia of chronic disease: ON Aranesp weekly for supplement.   CBC ordered for tomorrow 12. T2DM with hyperglycemia: Hgb  A1C-6.8.  Monitor BS ac/hs.  --Continue SSI as well as 4 units novolog TID with meals Monitor with increased mobility 13. Small  acute left pontine infarct  See #2 14. S/p b/l BKA  Continue to monitor  Bary Leriche, PA-C 01/25/2021  I have personally performed a face to face diagnostic evaluation, including, but not limited to relevant history and physical exam findings, of this patient and developed relevant assessment and plan.  Additionally, I have reviewed and concur with the physician assistant's documentation above.  Delice Lesch, MD, ABPMR  The patient's status has not changed. Any changes from the pre-admission screening or documentation from the acute chart are noted above.   Delice Lesch, MD, ABPMR

## 2021-01-25 NOTE — Progress Notes (Signed)
Patient ID: [name], male   DOB: [birth year], 70 y.o.   MRN: [id number] S: Pt for discharge to CIR today.  No complaints. O:BP (!) 110/57 (BP Location: Right Arm)   Pulse (!) 59   Temp 98.3 F (36.8 C) (Oral)   Resp 15   Ht 5\' 8"  (1.727 m)   Wt 104.5 kg   SpO2 100%   BMI 35.03 kg/m   Intake/Output Summary (Last 24 hours) at 01/25/2021 1109 Last data filed at 01/24/2021 1321 Gross per 24 hour  Intake 360 ml  Output 313 ml  Net 47 ml   Intake/Output: I/O last 3 completed shifts: In: 480 [P.O.:480] Out: 313 [Other:313]  Intake/Output this shift:  No intake/output data recorded. Weight change: -2.7 kg Gen: NAD CVS: RRR Resp:CTA Abd: +BS, soft, NT/ND Ext: LUE AVG +T/B, +LUE edema, s/p bilateral BKA's  Recent Labs  Lab 01/19/21 0458 01/20/21 0356 01/22/21 0348 01/22/21 1854 01/22/21 1902 01/23/21 0054 01/24/21 0151 01/25/21 0149  NA 131* 129* 132* 132* 134* 134* 130* 133*  K 3.7 4.8 4.2 3.8 3.5 4.1 4.4 3.8  CL 97* 94* 97* 95*  --  95* 94* 96*  CO2 20* 20* 23 23  --  23 24 26   GLUCOSE 192* 299* 163* 153*  --  87 232* 168*  BUN 61* 77* 64* 34*  --  39* 53* 31*  CREATININE 6.70* 8.08* 7.08* 4.56*  --  4.96* 6.39* 4.56*  ALBUMIN  --  2.8* 2.5*  --   --  3.0* 2.6*  --   CALCIUM 8.2* 8.2* 8.1* 8.1*  --  8.3* 8.1* 8.3*  PHOS  --   --   --   --   --  3.9  --   --   AST  --  294* 103*  --   --   --  70*  --   ALT  --  172* 32  --   --   --  20  --    Liver Function Tests: Recent Labs  Lab 01/20/21 0356 01/22/21 0348 01/23/21 0054 01/24/21 0151  AST 294* 103*  --  70*  ALT 172* 32  --  20  ALKPHOS 115 117  --  82  BILITOT 3.1* 2.0*  --  2.3*  PROT 6.3* 5.8*  --  5.6*  ALBUMIN 2.8* 2.5* 3.0* 2.6*   No results for input(s): LIPASE, AMYLASE in the last 168 hours. No results for input(s): AMMONIA in the last 168 hours. CBC: Recent Labs  Lab 01/22/21 0348 01/22/21 1902 01/22/21 1910 01/23/21 0054 01/24/21 0151 01/25/21 0149  WBC 9.2  --  10.6* 11.5*  12.5* 12.5*  NEUTROABS  --   --  6.5 9.1*  --   --   HGB 8.2*   < > 8.6* 8.7* 8.0* 7.6*  HCT 25.9*   < > 26.0* 27.1* 25.4* 24.0*  MCV 92.2  --  91.5 90.9 91.7 92.7  PLT 97*  --  107* 108* 155 165   < > = values in this interval not displayed.   Cardiac Enzymes: No results for input(s): CKTOTAL, CKMB, CKMBINDEX, TROPONINI in the last 168 hours. CBG: Recent Labs  Lab 01/24/21 1541 01/24/21 1803 01/24/21 2054 01/25/21 0713 01/25/21 1103  GLUCAP 157* 186* 140* 140* 180*    Iron Studies: No results for input(s): IRON, TIBC, TRANSFERRIN, FERRITIN in the last 72 hours. Studies/Results: MR ANGIO HEAD WO CONTRAST  Result Date: 01/23/2021 CLINICAL DATA:  Stroke, follow up EXAM:  MRA HEAD WITHOUT CONTRAST TECHNIQUE: Angiographic images of the Circle of Willis were acquired using MRA technique without intravenous contrast. COMPARISON:  Same day MRI head.  MRA April 28, 2006. FINDINGS: Anterior circulation: Motion limited evaluation of the intracranial ICAs, with potentially moderate right and severe left paraclinoid ICA stenosis. Bilateral MCAs and ACAs are patent. Hypoplastic left A1 ACA. Small (2 mm) anteriorly directed outpouching arising from the right paraclinoid ICA (series 3, image 81). Posterior circulation: Right dominant intradural vertebral artery. Multifocal moderate stenosis of the small/non dominant intradural left vertebral artery. Bilateral intradural vertebral arteries, basilar artery, and posterior cerebral arteries are patent without proximal hemodynamically significant stenosis. Right larger than left posterior communicating arteries with smaller absent right P1 PCA, anatomic variant. Anatomic variants: Detailed above. IMPRESSION: 1. No large vessel occlusion. 2. Motion limited evaluation of the intracranial ICAs with potentially severe left and moderate right paraclinoid ICA stenosis. A CTA could confirm and further characterize if clinically indicated. 3. Multifocal moderate  stenosis of the small/non dominant left intradural vertebral artery. 4. Small (2 mm) anteriorly directed outpouching arising from the right paraclinoid ICA, compatible with aneurysm versus infundibulum. Electronically Signed   By: Margaretha Sheffield M.D.   On: 01/23/2021 12:56   EEG adult  Result Date: 01/23/2021 Lora Havens, MD     01/23/2021  5:33 PM Patient Name: [name] MRN: [id number] Epilepsy Attending: Lora Havens Referring Physician/Provider: Dr Kerney Elbe Date: 01/23/2021 Duration: 22.53 mins Patient history: 70yo M with ams, tongue bite. EEG to evaluate for seizure. Level of alertness: Awake, asleep AEDs during EEG study: GBP Technical aspects: This EEG study was done with scalp electrodes positioned according to the 10-20 International system of electrode placement. Electrical activity was acquired at a sampling rate of 500Hz  and reviewed with a high frequency filter of 70Hz  and a low frequency filter of 1Hz . EEG data were recorded continuously and digitally stored. Description: The posterior dominant rhythm consists of 8 Hz activity of moderate voltage (25-35 uV) seen predominantly in posterior head regions, symmetric and reactive to eye opening and eye closing. Sleep was characterized by vertex waves, sleep spindles (12 to 14 Hz), maximal frontocentral region. EEG showed intermittent generalized 3 to 6 Hz theta-delta slowing. Hyperventilation and photic stimulation were not performed.   ABNORMALITY - Intermittent slow, generalized IMPRESSION: This study is suggestive of mild diffuse encephalopathy, nonspecific etiology. No seizures or epileptiform discharges were seen throughout the recording. [name]    (feeding supplement) PROSource Plus  30 mL Oral BID BM   amiodarone  200 mg Oral Daily   aspirin EC  81 mg Oral q morning   atorvastatin  80 mg Oral QHS   Chlorhexidine Gluconate Cloth  6 each Topical Q0600   clopidogrel  75 mg Oral Daily   darbepoetin (ARANESP)  injection - DIALYSIS  60 mcg Intravenous Q Thu-HD   feeding supplement (NEPRO CARB STEADY)  237 mL Oral BID BM   gabapentin  300 mg Oral QHS   heparin sodium (porcine)  1.6 mL Intravenous Once   insulin aspart  0-15 Units Subcutaneous TID WC   insulin aspart  4 Units Subcutaneous TID WC   insulin detemir  8 Units Subcutaneous QHS   mouth rinse  15 mL Mouth Rinse BID   multivitamin  1 tablet Oral QHS   pantoprazole  40 mg Oral Q0600   sodium bicarbonate  650 mg Oral BID    BMET    Component Value Date/Time  NA 133 (L) 01/25/2021 0149   NA 135 07/31/2019 1433   K 3.8 01/25/2021 0149   CL 96 (L) 01/25/2021 0149   CO2 26 01/25/2021 0149   GLUCOSE 168 (H) 01/25/2021 0149   BUN 31 (H) 01/25/2021 0149   BUN 35 (H) 07/31/2019 1433   CREATININE 4.56 (H) 01/25/2021 0149   CALCIUM 8.3 (L) 01/25/2021 0149   GFRNONAA 13 (L) 01/25/2021 0149   GFRAA 19 (L) 10/12/2019 1548   CBC    Component Value Date/Time   WBC 12.5 (H) 01/25/2021 0149   RBC 2.59 (L) 01/25/2021 0149   HGB 7.6 (L) 01/25/2021 0149   HGB 9.4 (L) 02/06/2019 1016   HCT 24.0 (L) 01/25/2021 0149   HCT 28.4 (L) 02/06/2019 1016   PLT 165 01/25/2021 0149   PLT 199 02/06/2019 1016   MCV 92.7 01/25/2021 0149   MCV 86 02/06/2019 1016   MCH 29.3 01/25/2021 0149   MCHC 31.7 01/25/2021 0149   RDW 18.0 (H) 01/25/2021 0149   RDW 18.5 (H) 02/06/2019 1016   LYMPHSABS 0.9 01/23/2021 0054   LYMPHSABS 1.3 01/29/2019 1033   MONOABS 1.3 (H) 01/23/2021 0054   EOSABS 0.0 01/23/2021 0054   EOSABS 0.1 01/29/2019 1033   BASOSABS 0.0 01/23/2021 0054   BASOSABS 0.0 01/29/2019 1033    Assessment/Plan:   Small acute infarct of left pons - presented with acute onset of AMS and seizure like activity.  Neuro following.  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 S/p 6 HD sessions.  He is currently set up for TTS at [facility] once out of CIR.  Continue with TTS schedule but will be MW and Saturday for holiday schedule.   Chronic systolic CHF - improved volume status with HD and UF. 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  Will dose again today.  Thrombocytopenia - no heparin Abnormal LFT's- per primary. HTN -stable Vascular access -s /p LUE AVG placement 01/19/21 with some edema but no pain.  Continue to follow for evidence of steal syndrome Disposition - for CIR admission today.  Set up for HD at [facility] on TTS schedule and will continue with that schedule while he remains an inpatient.   Donetta Potts, MD [employer] [phone]

## 2021-01-26 ENCOUNTER — Other Ambulatory Visit (HOSPITAL_COMMUNITY): Payer: Self-pay

## 2021-01-26 DIAGNOSIS — I639 Cerebral infarction, unspecified: Secondary | ICD-10-CM

## 2021-01-26 LAB — CBC
HCT: 23.6 % — ABNORMAL LOW (ref 39.0–52.0)
Hemoglobin: 7.5 g/dL — ABNORMAL LOW (ref 13.0–17.0)
MCH: 29.9 pg (ref 26.0–34.0)
MCHC: 31.8 g/dL (ref 30.0–36.0)
MCV: 94 fL (ref 80.0–100.0)
Platelets: 209 10*3/uL (ref 150–400)
RBC: 2.51 MIL/uL — ABNORMAL LOW (ref 4.22–5.81)
RDW: 18.6 % — ABNORMAL HIGH (ref 11.5–15.5)
WBC: 9 10*3/uL (ref 4.0–10.5)
nRBC: 0.3 % — ABNORMAL HIGH (ref 0.0–0.2)

## 2021-01-26 LAB — GLUCOSE, CAPILLARY
Glucose-Capillary: 124 mg/dL — ABNORMAL HIGH (ref 70–99)
Glucose-Capillary: 102 mg/dL — ABNORMAL HIGH (ref 70–99)
Glucose-Capillary: 168 mg/dL — ABNORMAL HIGH (ref 70–99)

## 2021-01-26 MED ORDER — HEPARIN SODIUM (PORCINE) 1000 UNIT/ML IJ SOLN
INTRAMUSCULAR | Status: AC
  Filled 2021-01-26: qty 1

## 2021-01-26 NOTE — Progress Notes (Signed)
Inpatient Rehabilitation Care Coordinator Assessment and Plan Patient Details  Name: [name] MRN: [id number] Date of Birth: 1950/11/13  Today's Date: 01/26/2021  Hospital Problems: Principal Problem:   Debility Active Problems:   Left pontine stroke [facility])  Past Medical History:  Past Medical History:  Diagnosis Date   CHF (congestive heart failure) ([place])    Chronic Kidney Disease IV    Exertional shortness of breath    "before my last OR; I'm fine now" (09/26/2012)   GERD (gastroesophageal reflux disease)    High cholesterol    Hx of seasonal allergies    Hypertension    Myocardial infarction ([place])    Scrotal edema 01/30/2019   Stroke ([place]) 2009   memory loss   Type II diabetes mellitus ([place])    Past Surgical History:  Past Surgical History:  Procedure Laterality Date   AMPUTATION Right 07/29/2012   Procedure: AMPUTATION 3RD TOE;  Surgeon: Kerin Salen, MD;  Location: [place];  Service: Orthopedics;  Laterality: Right;  right third toe amputation   AMPUTATION Right 08/02/2012   Procedure: right transmetatarsal amputation;  Surgeon: Kerin Salen, MD;  Location: [place];  Service: Orthopedics;  Laterality: Right;   AMPUTATION Right 09/06/2012   Procedure: AMPUTATION BELOW KNEE;  Surgeon: Kerin Salen, MD;  Location: [place];  Service: Orthopedics;  Laterality: Right;   AMPUTATION Left 09/27/2012   Procedure: AMPUTATION BELOW KNEE;  Surgeon: Kerin Salen, MD;  Location: [place];  Service: Orthopedics;  Laterality: Left;   AV FISTULA PLACEMENT Left 01/19/2021   Procedure: ARTERIOVENOUS (AV) FISTULA OF LEFT ARM  USING GRAFT;  Surgeon: Angelia Mould, MD;  Location: [place];  Service: Vascular;  Laterality: Left;   CATARACT EXTRACTION W/ INTRAOCULAR LENS  IMPLANT, BILATERAL  ?2010   CORONARY ARTERY BYPASS GRAFT  2008   CABG X4   ESOPHAGOGASTRODUODENOSCOPY N/A 10/08/2012   Procedure: ESOPHAGOGASTRODUODENOSCOPY (EGD);  Surgeon: Winfield Cunas., MD;  Location: [facility] ENDOSCOPY;   Service: Endoscopy;  Laterality: N/A;   IR FLUORO GUIDE CV LINE RIGHT  01/14/2021   IR US GUIDE VASC ACCESS RIGHT  01/14/2021   PERIPHERALLY INSERTED CENTRAL CATHETER INSERTION Right 09/2012   upper arm   Social History:  reports that he has quit smoking. His smoking use included cigarettes. He has a 2.40 pack-year smoking history. He has never used smokeless tobacco. He reports that he does not drink alcohol and does not use drugs.  Family / Support Systems Marital Status: Divorced Patient Roles: Parent, Other (Comment) Spouse/Significant Other: ex-wife [name] [phone]-cell together 40 years live together now Children: Louie Bun [phone] Two other grown sons' Other Supports: Shirley-sister [phone]  Rufus-brother [phone] Anticipated Caregiver: Ex-wife and son's Ability/Limitations of Caregiver: Ex-wife has health issues and son's work Building control surveyor Availability: Intermittent Family Dynamics: Close knit family pt and ex-wife get along and she can help some. Pt has always been independent and wants to get back to this level. He was still driving PTA  Social History Preferred language: English Religion: Holiness/Pentecostal Cultural Background: No issues Education: [place] - How often do you need to have someone help you when you read instructions, pamphlets, or other written material from your doctor or pharmacy?: Never Writes: Yes Employment Status: Disabled Public relations account executive Issues: No issues Guardian/Conservator: none-according to MD pt is not fully capable of making his own decisions while here. Will look toward his son-Dannie who he has appointed.   Abuse/Neglect Abuse/Neglect Assessment Can Be Completed: Yes Physical Abuse:  Denies Verbal Abuse: Denies Sexual Abuse: Denies Exploitation of patient/patient's resources: Denies Self-Neglect: Denies  Patient response to: Social Isolation - How often do you feel lonely or isolated from those around you?:  Never  Emotional Status Pt's affect, behavior and adjustment status: Pt is motivated to get back to his independent level. He was driving and doing for himself, he is aware he may need O2 at home and is now on HD. Recent Psychosocial Issues: other health issues-B-BKA's 2013 and 2014, HX-CVA and CHF Psychiatric History: No history he feels he is doing quite well with all he is dealing with. He seems and appears to be coping appropriately with all of his medical issues Substance Abuse History: No issues  Patient / Family Perceptions, Expectations & Goals Pt/Family understanding of illness & functional limitations: Pt can explain his HD and now new CVA, he does talk with the MD and feels he understands what he is dealing with and treatment plan going forward. Premorbid pt/family roles/activities: Father, ex-spouse, sibling, retiree, etc Anticipated changes in roles/activities/participation: resume Pt/family expectations/goals: Pt states: " I plan to be doing for myself when I leave, I want to drive again.'  [employer]: Other (Comment) (New HD-OP accepted at [facility] waiting for chair time) Premorbid Home Care/DME Agencies: Other (Comment) (has broken wc gave away bsc) Transportation available at discharge: self and ex-wife Is the patient able to respond to transportation needs?: Yes In the past 12 months, has lack of transportation kept you from medical appointments or from getting medications?: No In the past 12 months, has lack of transportation kept you from meetings, work, or from getting things needed for daily living?: No  Discharge Planning Living Arrangements: Other (Comment) (ex-wife) Support Systems: Children, Other relatives, Friends/neighbors Type of Residence: Private residence 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 Will need to confirm if ex-wife can provide assist and is willing to assist him if needed. Care Coordinator Barriers to Discharge: Lack of/limited family support, Insurance for SNF coverage, Decreased caregiver support, New oxygen, Other (comments) Care Coordinator Barriers to Discharge Comments: new HD Care Coordinator Anticipated Follow Up Needs: HH/OP  Clinical Impression Pleasant quite talkative gentleman who is motivated to do well and get back to his independent level. He is new to HD and hopes to be able to drive himself there. He has been approved for [facility] for transport if wishes to use. Supportive ex-wife and son's. Await therapists evaluations.  [name] 01/26/2021, 1:17 PM

## 2021-01-26 NOTE — Progress Notes (Signed)
PROGRESS NOTE   Subjective/Complaints:  Pt reports tightness in LUE- forearm and upper arm tight/swollen.  Not peeing anymore per pt- for 3 weeks.  LBM 2 days ago- doesn't feel constipated.   Denies pain.    ROS:  Pt denies SOB, abd pain, CP, N/V/C/D, and vision changes   Objective:   No results found. Recent Labs    01/25/21 1535 01/26/21 1500  WBC 11.8* 9.0  HGB 7.8* 7.5*  HCT 24.9* 23.6*  PLT 195 209   Recent Labs    01/25/21 0149 01/25/21 1535  NA 133* 131*  K 3.8 3.7  CL 96* 96*  CO2 26 24  GLUCOSE 168* 150*  BUN 31* 38*  CREATININE 4.56* 5.18*  CALCIUM 8.3* 8.1*    Intake/Output Summary (Last 24 hours) at 01/26/2021 1744 Last data filed at 01/26/2021 1300 Gross per 24 hour  Intake 480 ml  Output --  Net 480 ml        Physical Exam: Vital Signs Blood pressure (!) 103/53, pulse 64, temperature 98.2 F (36.8 C), temperature source Temporal, resp. rate 18, height 5\' 8"  (1.727 m), weight 104.6 kg, SpO2 100 %.    General: awake, alert, appropriate, laying supine in bed; NAD HENT: conjugate gaze; oropharynx moist CV: regular rate; no JVD Pulmonary: CTA B/L; no W/R/R- good air movement GI: soft, NT, ND, (+)BS Psychiatric: appropriate but very flat Neurological: alert Skin- LUE is swollen compared to R side- esp in L forearm; less so in L upper arm- no cellulitis seen/no erythema; hand mildly swollen- ? Positioning? Musculoskeletal:     Cervical back: Normal range of motion and neck supple.     Comments: B/l BKA with mild tenderness, mild edema  Skin:    General: Skin is warm and dry.  Neurological:     Mental Status: He is alert.     Comments: Alert Dysarthria Motor: Grossly 4/5, left weaker than right   Assessment/Plan: 1. Functional deficits which require 3+ hours per day of interdisciplinary therapy in a comprehensive inpatient rehab setting. Physiatrist is providing close team  supervision and 24 hour management of active medical problems listed below. Physiatrist and rehab team continue to assess barriers to discharge/monitor patient progress toward functional and medical goals  Care Tool:  Bathing    Body parts bathed by patient: Right arm, Left arm, Chest, Abdomen, Front perineal area, Right upper leg, Left upper leg, Face   Body parts bathed by helper: Buttocks Body parts n/a: Right lower leg, Left lower leg   Bathing assist Assist Level: Minimal Assistance - Patient > 75%     Upper Body Dressing/Undressing Upper body dressing   What is the patient wearing?: Button up shirt    Upper body assist Assist Level: Moderate Assistance - Patient 50 - 74%    Lower Body Dressing/Undressing Lower body dressing    Lower body dressing activity did not occur: Refused       Lower body assist       Toileting Toileting Toileting Activity did not occur (Clothing management and hygiene only): N/A (no void or bm)  Toileting assist       Transfers Chair/bed transfer  Transfers assist  Chair/bed transfer assist level: 2 Helpers     Locomotion Ambulation   Ambulation assist              Walk 10 feet activity   Assist           Walk 50 feet activity   Assist           Walk 150 feet activity   Assist           Walk 10 feet on uneven surface  activity   Assist           Wheelchair     Assist               Wheelchair 50 feet with 2 turns activity    Assist            Wheelchair 150 feet activity     Assist          Blood pressure (!) 103/53, pulse 64, temperature 98.2 F (36.8 C), temperature source Temporal, resp. rate 18, height 5\' 8"  (1.727 m), weight 104.6 kg, SpO2 100 %.  Medical Problem List and Plan: 1.  Deficits with mobility, self-care, transfers, endurance secondary to L pontine stroke              -patient may not shower due to HD catheter             -ELOS/Goals:  10-14 days/Min A             first day of evaluations -con't CIR 2.  Antithrombotics: -DVT/anticoagulation:  Mechanical: Sequential compression devices, below knee Bilateral lower extremities             -antiplatelet therapy: DAPT X 3 weeks followed by ASA alone.  3. Pain Management: Tylenol prn.  4. Mood: LCSW to follow for evaluation and support.              -antipsychotic agents: N/A 5. Neuropsych: This patient is ?fully capable of making decisions on his own behalf. 6. Skin/Wound Care: Routine pressure relief measures.  7. Fluids/Electrolytes/Nutrition: Strict I/O. 1200 cc FR. Renal diet' 8. New ESRD due to cardiorenal syndrome: On HD TTS --schedule at the end of the day to help with tolerance of therapy             --Daily weight 9. Demand ischemia/NSVT: On amiodarone daily 10. Thrombocytopenia: Platelets recovering from drop to 76-->108  11/21- Plts 208 today- con't to monitor CBC ordered for tomorrow 11. Anemia of chronic disease: ON Aranesp weekly for supplement.              CBC ordered for tomorrow  11/21- Hb a little decreased at 7.5- will recheck per renal  12. T2DM with hyperglycemia: Hgb  A1C-6.8.  Monitor BS ac/hs.  --Continue SSI as well as 4 units novolog TID with meals Monitor with increased mobility 13. Small acute left pontine infarct             See #2 14. S/p b/l BKA- chronic             Continue to monitor    LOS: 1 days A FACE TO FACE EVALUATION WAS PERFORMED  Jaime Dome 01/26/2021, 5:44 PM

## 2021-01-26 NOTE — Progress Notes (Signed)
Inpatient Rehabilitation  Patient information reviewed and entered into eRehab system by Shaeleigh Graw M. Trinidad Petron, M.A., CCC/SLP, PPS Coordinator.  Information including medical coding, functional ability and quality indicators will be reviewed and updated through discharge.    

## 2021-01-26 NOTE — Plan of Care (Signed)
  Problem: RH Swallowing Goal: LTG Patient will consume least restrictive diet using compensatory strategies with assistance (SLP) Description: LTG:  Patient will consume least restrictive diet using compensatory strategies with assistance (SLP) Flowsheets (Taken 01/26/2021 1614) LTG: Pt Patient will consume least restrictive diet using compensatory strategies with assistance of (SLP): Modified Independent   Problem: RH Awareness Goal: LTG: Patient will demonstrate awareness during functional activites type of (SLP) Description: LTG: Patient will demonstrate awareness during functional activites type of (SLP) Flowsheets (Taken 01/26/2021 1614) Patient will demonstrate during cognitive/linguistic activities awareness type of:  Emergent  Anticipatory LTG: Patient will demonstrate awareness during cognitive/linguistic activities with assistance of (SLP): Supervision

## 2021-01-26 NOTE — Progress Notes (Addendum)
Inpatient [facility] Individual Statement of Services  Patient Name:  [name]  Date:  01/26/2021  Welcome to the [facility].  Our goal is to provide you with an individualized program based on your diagnosis and situation, designed to meet your specific needs.  With this comprehensive rehabilitation program, you will be expected to participate in at least 3 hours of rehabilitation therapies Monday-Friday, with modified therapy programming on the weekends.  Your rehabilitation program will include the following services:  Physical Therapy (PT), Occupational Therapy (OT), Speech Therapy ( SP), 24 hour per day rehabilitation nursing, Therapeutic Recreaction (TR), Neuropsychology, Care Coordinator, Rehabilitation Medicine, Nutrition Services, and Pharmacy Services  Weekly team conferences will be held on Tuesday to discuss your progress.  Your Inpatient Rehabilitation Care Coordinator will talk with you frequently to get your input and to update you on team discussions.  Team conferences with you and your family in attendance may also be held.  Expected length of stay: 12-14 days  Overall anticipated outcome: CGA-min level  Depending on your progress and recovery, your program may change. Your Inpatient Rehabilitation Care Coordinator will coordinate services and will keep you informed of any changes. Your Inpatient Rehabilitation Care Coordinator's name and contact numbers are listed  below.  The following services may also be recommended but are not provided by the [facility] will be made to provide these services after discharge if needed.  Arrangements include referral to agencies that provide these services.  Your insurance has been verified to be:  [facility] Medicare Your primary doctor is:  [facility]  Pertinent  information will be shared with your doctor and your insurance company.  Inpatient Rehabilitation Care Coordinator:  Ovidio Kin, [place] or Emilia Beck  Information discussed with and copy given to patient by: [name], 01/26/2021, 12:25 PM

## 2021-01-26 NOTE — Evaluation (Signed)
Speech Language Pathology Assessment and Plan  Patient Details  Name: [name] MRN: [id number] Date of Birth: 1950-03-24  SLP Diagnosis: Dysphagia;Cognitive Impairments  Rehab Potential:  (Difficult to determine due to minimal participation) ELOS: ~12-14 days   Today's Date: 01/26/2021 SLP Individual Time: 1430-1500 SLP Individual Time Calculation (min): 30 min and Today's Date: 01/26/2021 SLP Missed Time: 30 Minutes Missed Time Reason: Other (Comment) (pt left unit for dialysis)  Hospital Problem: Principal Problem:   Debility Active Problems:   Left pontine stroke [name]  Past Medical History:  Past Medical History:  Diagnosis Date   CHF (congestive heart failure) ([place])    Chronic Kidney Disease IV    Exertional shortness of breath    "before my last OR; I'm fine now" (09/26/2012)   GERD (gastroesophageal reflux disease)    High cholesterol    Hx of seasonal allergies    Hypertension    Myocardial infarction ([place])    Scrotal edema 01/30/2019   Stroke ([place]) 2009   memory loss   Type II diabetes mellitus ([place])    Past Surgical History:  Past Surgical History:  Procedure Laterality Date   AMPUTATION Right 07/29/2012   Procedure: AMPUTATION 3RD TOE;  Surgeon: Kerin Salen, MD;  Location: [place];  Service: Orthopedics;  Laterality: Right;  right third toe amputation   AMPUTATION Right 08/02/2012   Procedure: right transmetatarsal amputation;  Surgeon: Kerin Salen, MD;  Location: [place];  Service: Orthopedics;  Laterality: Right;   AMPUTATION Right 09/06/2012   Procedure: AMPUTATION BELOW KNEE;  Surgeon: Kerin Salen, MD;  Location: [place];  Service: Orthopedics;  Laterality: Right;   AMPUTATION Left 09/27/2012   Procedure: AMPUTATION BELOW KNEE;  Surgeon: Kerin Salen, MD;  Location: [place];  Service: Orthopedics;  Laterality: Left;   AV FISTULA PLACEMENT Left 01/19/2021   Procedure: ARTERIOVENOUS (AV) FISTULA OF LEFT ARM  USING GRAFT;  Surgeon: Angelia Mould,  MD;  Location: [place];  Service: Vascular;  Laterality: Left;   CATARACT EXTRACTION W/ INTRAOCULAR LENS  IMPLANT, BILATERAL  ?2010   CORONARY ARTERY BYPASS GRAFT  2008   CABG X4   ESOPHAGOGASTRODUODENOSCOPY N/A 10/08/2012   Procedure: ESOPHAGOGASTRODUODENOSCOPY (EGD);  Surgeon: Winfield Cunas., MD;  Location[facility] ENDOSCOPY;  Service: Endoscopy;  Laterality: N/A;   IR FLUORO GUIDE CV LINE RIGHT  01/14/2021   IR US GUIDE VASC ACCESS RIGHT  01/14/2021   PERIPHERALLY INSERTED CENTRAL CATHETER INSERTION Right 09/2012   upper arm    Assessment / Plan / Recommendation Clinical Impression HPI: [name] is a 70 year old male with history of HTN, CAD s/p CABG, CVA w/STM deficits, T2DM, B-BKAs, CKD IV who has refused HD in the past and was admitted on 01/11/21 with N/V and bilateral LE edema due to acute on chronic CHF. He was scheduled for discharge but became unresponsive on 01/22/21, was incontinent of B/B and confused when aroused. Dr. Cheral Marker consulted due to concerns of seizure activity and exam revealed asterixis likely due to metabolic derangements and questioned seizure due to low calcium. MRI brain done for work up and showed small acute left pontine infarct. 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   EEG was suggestive of mild diffuse encephalopathy without seizure or epileptiform discharges.  Dr. Erlinda Hong recommended ASA/plavix X 3 weeks followed by Plavix alone as well as 30 day cardiac event monitor to rule out A fib. Patient with limitations in mobility and ADLs. CIR recommended  due to functional decline.   SLP obtained limited assessment this date due to limited participation and pt leaving unit for dialysis. Pt asked "why can't I just do this tomorrow?" Attempts to motivate and encourage were minimally effective and appeared to raise frustration. Pt was agreeable to unstructured conversational tasks however. Pt demonstrated intellectual awareness to his stroke resulting in  physical changes impacting his strength. He stated his primary goal for rehab is to lose weight. Pt denied cognitive-communication changes. Pt displayed appropriate sustained attention during encounter. He acknowledged mastication changes resulting in the need for modified diet and stated impaired mastication is attributed to misplaced dentures. He denied consideration of participating in a clinical swallow exam. Pt is currently consuming a Dys 2 diet and thin liquids. Pills were recommended whole in puree per acute care SLP. Nurse reported no apparent issues during meals today. Continue current diet at this time. Pt was oriented x4. Speech was considered 100% intelligible at conversation level. Expressive/receptive language appeared grossly intact. Pt missed 30 minutes of skilled ST eval due to going off unit for dialysis. Pt would benefit from further cognitive-communication and swallow assessments pending patient willingness to determine further candidacy for SLP services and to ultimately maximize functional independence prior to discharge. Pt verbalized agreement in SLP returning tomorrow for continued intervention.   Skilled Therapeutic Interventions          Pt participated in informal cognitive-communication assessment and oral mechanism exam. Please see above.  SLP Assessment  Patient will need skilled Speech Lanaguage Pathology Services during CIR admission    Recommendations  SLP Diet Recommendations: Dysphagia 2 (Fine chop);Thin Liquid Administration via: Cup;Straw Medication Administration: Whole meds with puree Supervision: Intermittent supervision to cue for compensatory strategies Compensations: Minimize environmental distractions;Slow rate;Small sips/bites;Follow solids with liquid Postural Changes and/or Swallow Maneuvers: Seated upright 90 degrees Oral Care Recommendations: Oral care BID Recommendations for Other Services: Neuropsych consult Patient destination: Home Follow up  Recommendations: Other (comment) (TBD) Equipment Recommended: None recommended by SLP    SLP Frequency 3 to 5 out of 7 days   SLP Duration  SLP Intensity  SLP Treatment/Interventions ~12-14 days  Minumum of 1-2 x/day, 30 to 90 minutes  Cognitive remediation/compensation;Cueing hierarchy;Functional tasks;Patient/family education;Therapeutic Activities    Pain Pain Assessment Pain Scale: 0-10 Pain Score: 0-No pain  Prior Functioning Cognitive/Linguistic Baseline: Information not available Type of Home: House  Lives With: Other (Comment) Available Help at Discharge: Family  SLP Evaluation Cognition Overall Cognitive Status: No family/caregiver present to determine baseline cognitive functioning Arousal/Alertness: Awake/alert Orientation Level: Oriented X4 Year: 2022 Month: November Day of Week: Correct Memory:  (further testing needed) Immediate Memory Recall: Sock;Blue;Bed Memory Recall Sock: Without Cue Memory Recall Blue: Without Cue Memory Recall Bed: Without Cue Awareness: Impaired Awareness Impairment: Emergent impairment Safety/Judgment:  (further testing needed) Comments: Limited cognitive assessment obtained due to minimal participation. Pt appeared functional for basic tasks  Comprehension Auditory Comprehension Overall Auditory Comprehension: Appears within functional limits for tasks assessed Expression Expression Primary Mode of Expression: Verbal Verbal Expression Overall Verbal Expression: Appears within functional limits for tasks assessed Written Expression Dominant Hand: Left Oral Motor Oral Motor/Sensory Function Overall Oral Motor/Sensory Function: Within functional limits Motor Speech Overall Motor Speech: Appears within functional limits for tasks assessed Intelligibility: Intelligible  Care Tool Care Tool Cognition Ability to hear (with hearing aid or hearing appliances if normally used Ability to hear (with hearing aid or hearing  appliances if normally used): 0. Adequate - no difficulty in normal conservation, social  interaction, listening to TV   Expression of Ideas and Wants Expression of Ideas and Wants: 4. Without difficulty (complex and basic) - expresses complex messages without difficulty and with speech that is clear and easy to understand   Understanding Verbal and Non-Verbal Content Understanding Verbal and Non-Verbal Content: 3. Usually understands - understands most conversations, but misses some part/intent of message. Requires cues at times to understand  Memory/Recall Ability Memory/Recall Ability : Current season;That he or she is in a hospital/hospital unit   PMSV Assessment  PMSV Trial Intelligibility: Intelligible  Bedside Swallowing Assessment General Date of Onset: 01/23/21 Previous Swallow Assessment: Bedside swallow on 11/18 Diet Prior to this Study: Dysphagia 2 (chopped);Thin liquids Temperature Spikes Noted: No Respiratory Status: Supplemental O2 delivered via (comment) History of Recent Intubation: No Behavior/Cognition: Fusing/Irritable Oral Cavity - Dentition: Dentures, top;Dentures, not available;Edentulous Self-Feeding Abilities: Able to feed self Vision: Functional for self-feeding Patient Positioning: Upright in bed Baseline Vocal Quality: Normal Volitional Cough: Strong Volitional Swallow: Able to elicit  Oral Care Assessment Does patient have any of the following "high(er) risk" factors?: None of the above Does patient have any of the following "at risk" factors?: Oxygen therapy - cannula, mask, simple oxygen devices Patient is AT RISK: Order set for Adult Oral Care Protocol initiated -  "At Risk Patients" option selected (see row information) Patient is LOW RISK: Follow universal precautions (see row information) Ice Chips Ice chips:  (refused) Thin Liquid Thin Liquid:  (refused) Nectar Thick Nectar Thick Liquid: Not tested Honey Thick Honey Thick Liquid: Not  tested Puree Puree: Not tested Solid Solid: Not tested BSE Assessment Risk for Aspiration Other Related Risk Factors: Previous CVA  Short Term Goals: Week 1: SLP Short Term Goal 1 (Week 1): Patient will participate in further cognitive-communication assessment SLP Short Term Goal 2 (Week 1): Patient will consume current diet with effective mastication and oral clearance and without overt s/sx of aspiration with sup A verbal cues for implementation of swallow safety SLP Short Term Goal 3 (Week 1): Patient will consume Dys 3 PO trials with improved mastication timeliness and effectiveness and without overt s/sx of aspiration SLP Short Term Goal 4 (Week 1): Patient will verbalize 2-3 physical changes s/p CVA and how these changes may impact his functioning and safety upon discharge with sup A verbal cues  Refer to Care Plan for Long Term Goals  Recommendations for other services: Neuropsych  Discharge Criteria: Patient will be discharged from SLP if patient refuses treatment 3 consecutive times without medical reason, if treatment goals not met, if there is a change in medical status, if patient makes no progress towards goals or if patient is discharged from hospital.  The above assessment, treatment plan, treatment alternatives and goals were discussed and mutually agreed upon: by patient  Patty Sermons 01/26/2021, 4:13 PM

## 2021-01-26 NOTE — Progress Notes (Signed)
Patient ID: [name], male   DOB: 09-May-1950, 70 y.o.   MRN: [id number]  [facility] Progress Note   Assessment/ Plan:   1.  Deconditioning/mobility deficits: Following hospitalization and small acute left pontine infarct.  Appears to be improving status post initiation of dialysis and ongoing PT.  Admitted to CIR. 2. ESRD: From underlying progressive chronic kidney disease and exacerbated by cardiorenal syndrome now started on hemodialysis on a TTS schedule.  Placement will be off schedule due to holiday accommodations.  He has been accepted to [facility] upon discharge.  He is status post left upper extremity AV graft placement on 01/19/2021 and currently getting hemodialysis via right IJ TDC. 3. Anemia: Without overt blood loss, continue ESA and monitoring hemoglobin/hematocrit trend.  PRBC transfusion if hemoglobin <7.0. 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 5. Nutrition: Continue renal diet with ongoing renal multivitamin supplementation.  Continue nutritional supplementation as indicated by albumin levels. 6. Hypertension: Blood pressure acceptable, continue to monitor with dialysis.  Subjective:   Reports to be feeling fair and denies any focal complaints.   Objective:   BP (!) 109/54 (BP Location: Right Arm)   Pulse 63   Temp 98.8 F (37.1 C) (Oral)   Resp 16   Ht 5\' 8"  (1.727 m)   Wt 104.5 kg   SpO2 100%   BMI 35.03 kg/m   Physical Exam: Gen: Appears comfortable resting in bed, awaiting assistance with cleaning himself CVS: Pulse regular rhythm, normal rate, S1 and S2 normal.  Right IJ TDC in place Resp: Clear to auscultation bilaterally, no rales/rhonchi Abd: Soft, obese, nontender, bowel sounds normal Ext: Status post bilateral below-knee amputations with socks over stumps.  Left upper arm AV graft with palpable thrill.  Labs: BMET Recent Labs  Lab 01/20/21 0356  01/22/21 0348 01/22/21 1854 01/22/21 1902 01/23/21 0054 01/24/21 0151 01/25/21 0149 01/25/21 1535  NA 129* 132* 132* 134* 134* 130* 133* 131*  K 4.8 4.2 3.8 3.5 4.1 4.4 3.8 3.7  CL 94* 97* 95*  --  95* 94* 96* 96*  CO2 20* 23 23  --  23 24 26 24   GLUCOSE 299* 163* 153*  --  87 232* 168* 150*  BUN 77* 64* 34*  --  39* 53* 31* 38*  CREATININE 8.08* 7.08* 4.56*  --  4.96* 6.39* 4.56* 5.18*  CALCIUM 8.2* 8.1* 8.1*  --  8.3* 8.1* 8.3* 8.1*  PHOS  --   --   --   --  3.9  --   --  4.3   CBC Recent Labs  Lab 01/22/21 1910 01/23/21 0054 01/24/21 0151 01/25/21 0149 01/25/21 1535  WBC 10.6* 11.5* 12.5* 12.5* 11.8*  NEUTROABS 6.5 9.1*  --   --   --   HGB 8.6* 8.7* 8.0* 7.6* 7.8*  HCT 26.0* 27.1* 25.4* 24.0* 24.9*  MCV 91.5 90.9 91.7 92.7 94.0  PLT 107* 108* 155 165 195      Medications:     (feeding supplement) PROSource Plus  30 mL Oral BID BM   amiodarone  200 mg Oral Daily   aspirin EC  81 mg Oral q morning   atorvastatin  80 mg Oral QHS   Chlorhexidine Gluconate Cloth  6 each Topical Q0600   clopidogrel  75 mg Oral Daily   [START ON 01/29/2021] darbepoetin (ARANESP) injection - DIALYSIS  60 mcg Intravenous Q Thu-HD   feeding supplement (NEPRO CARB STEADY)  237 mL Oral BID BM   gabapentin  300 mg Oral QHS   insulin aspart  0-15 Units Subcutaneous TID WC   insulin aspart  4 Units Subcutaneous TID WC   mouth rinse  15 mL Mouth Rinse BID   multivitamin  1 tablet Oral QHS   pantoprazole  40 mg Oral Q0600   sodium bicarbonate  650 mg Oral BID   Elmarie Shiley, MD 01/26/2021, 8:34 AM

## 2021-01-26 NOTE — Plan of Care (Addendum)
Nutrition Education Note  RD consulted for diet education. Provided "Eating Healthy with Kidney Disease" handout. Reviewed food groups and provided written recommended serving sizes specifically determined for patient's current nutritional status.   Explained why diet restrictions are needed and provided lists of foods to limit/avoid that are high potassium, sodium, and phosphorus. Provided specific recommendations on safer alternatives of these foods. Strongly encouraged compliance of this diet.   Discussed importance of protein intake at each meal and snack. Provided examples of how to maximize protein intake throughout the day. Discussed renal-friendly beverage options.Teach back method used.  Expect good compliance.  Current diet order is dysphagia 2 diet, patient is consuming approximately 100% of meals at this time. Pt reports having a good appetite with no difficulties currently and PTA. Labs and medications reviewed. Pt currently has Nepro and Prosource plus ordered and has been consuming them. No further nutrition interventions warranted at this time. RD contact information provided. If additional nutrition issues arise, please re-consult RD.  Thomas Parker, MS, RD, LDN RD pager number/after hours weekend pager number on Amion.

## 2021-01-26 NOTE — Plan of Care (Signed)
  Problem: RH Balance Goal: LTG Patient will maintain dynamic standing with ADLs (OT) Description: LTG:  Patient will maintain dynamic standing balance with assist during activities of daily living (OT)  Flowsheets (Taken 01/26/2021 1557) LTG: Pt will maintain dynamic standing balance during ADLs with: Minimal Assistance - Patient > 75%   Problem: Sit to Stand Goal: LTG:  Patient will perform sit to stand in prep for activites of daily living with assistance level (OT) Description: LTG:  Patient will perform sit to stand in prep for activites of daily living with assistance level (OT) Flowsheets (Taken 01/26/2021 1557) LTG: PT will perform sit to stand in prep for activites of daily living with assistance level: Minimal Assistance - Patient > 75%   Problem: RH Bathing Goal: LTG Patient will bathe all body parts with assist levels (OT) Description: LTG: Patient will bathe all body parts with assist levels (OT) Flowsheets (Taken 01/26/2021 1557) LTG: Pt will perform bathing with assistance level/cueing: Supervision/Verbal cueing   Problem: RH Dressing Goal: LTG Patient will perform upper body dressing (OT) Description: LTG Patient will perform upper body dressing with assist, with/without cues (OT). Flowsheets (Taken 01/26/2021 1557) LTG: Pt will perform upper body dressing with assistance level of: Minimal Assistance - Patient > 75% Goal: LTG Patient will perform lower body dressing w/assist (OT) Description: LTG: Patient will perform lower body dressing with assist, with/without cues in positioning using equipment (OT) Flowsheets (Taken 01/26/2021 1557) LTG: Pt will perform lower body dressing with assistance level of: Minimal Assistance - Patient > 75%   Problem: RH Toileting Goal: LTG Patient will perform toileting task (3/3 steps) with assistance level (OT) Description: LTG: Patient will perform toileting task (3/3 steps) with assistance level (OT)  Flowsheets (Taken 01/26/2021  1557) LTG: Pt will perform toileting task (3/3 steps) with assistance level: Minimal Assistance - Patient > 75%   Problem: RH Toilet Transfers Goal: LTG Patient will perform toilet transfers w/assist (OT) Description: LTG: Patient will perform toilet transfers with assist, with/without cues using equipment (OT) Flowsheets (Taken 01/26/2021 1557) LTG: Pt will perform toilet transfers with assistance level of: Minimal Assistance - Patient > 75%

## 2021-01-27 LAB — GLUCOSE, CAPILLARY
Glucose-Capillary: 173 mg/dL — ABNORMAL HIGH (ref 70–99)
Glucose-Capillary: 159 mg/dL — ABNORMAL HIGH (ref 70–99)
Glucose-Capillary: 150 mg/dL — ABNORMAL HIGH (ref 70–99)

## 2021-01-27 NOTE — Evaluation (Signed)
Physical Therapy Assessment and Plan  Patient Details  Name: [name] MRN: [id number] Date of Birth: 11-Sep-1950  PT Diagnosis: Difficulty walking, Dizziness and giddiness, Hemiparesis non-dominant, Muscle weakness, and Pain in LUE Rehab Potential: Fair ELOS: 10-14 days   Today's Date: 01/27/2021 PT Individual Time:  1101-1210  PT individual Time Calculation (min): 79 min     Hospital Problem: Principal Problem:   Left pontine stroke ([place]) Active Problems:   Debility   Past Medical History:  Past Medical History:  Diagnosis Date   CHF (congestive heart failure) ([place])    Chronic Kidney Disease IV    Exertional shortness of breath    "before my last OR; I'm fine now" (09/26/2012)   GERD (gastroesophageal reflux disease)    High cholesterol    Hx of seasonal allergies    Hypertension    Myocardial infarction ([place])    Scrotal edema 01/30/2019   Stroke ([place]) 2009   memory loss   Type II diabetes mellitus ([place])    Past Surgical History:  Past Surgical History:  Procedure Laterality Date   AMPUTATION Right 07/29/2012   Procedure: AMPUTATION 3RD TOE;  Surgeon: Kerin Salen, MD;  Location: [place];  Service: Orthopedics;  Laterality: Right;  right third toe amputation   AMPUTATION Right 08/02/2012   Procedure: right transmetatarsal amputation;  Surgeon: Kerin Salen, MD;  Location: [place];  Service: Orthopedics;  Laterality: Right;   AMPUTATION Right 09/06/2012   Procedure: AMPUTATION BELOW KNEE;  Surgeon: Kerin Salen, MD;  Location: [place];  Service: Orthopedics;  Laterality: Right;   AMPUTATION Left 09/27/2012   Procedure: AMPUTATION BELOW KNEE;  Surgeon: Kerin Salen, MD;  Location: [place];  Service: Orthopedics;  Laterality: Left;   AV FISTULA PLACEMENT Left 01/19/2021   Procedure: ARTERIOVENOUS (AV) FISTULA OF LEFT ARM  USING GRAFT;  Surgeon: Angelia Mould, MD;  Location: [place];  Service: Vascular;  Laterality: Left;   CATARACT EXTRACTION W/ INTRAOCULAR LENS   IMPLANT, BILATERAL  ?2010   CORONARY ARTERY BYPASS GRAFT  2008   CABG X4   ESOPHAGOGASTRODUODENOSCOPY N/A 10/08/2012   Procedure: ESOPHAGOGASTRODUODENOSCOPY (EGD);  Surgeon: Winfield Cunas., MD;  Location: [facility] ENDOSCOPY;  Service: Endoscopy;  Laterality: N/A;   IR FLUORO GUIDE CV LINE RIGHT  01/14/2021   IR US GUIDE VASC ACCESS RIGHT  01/14/2021   PERIPHERALLY INSERTED CENTRAL CATHETER INSERTION Right 09/2012   upper arm    Assessment & Plan Clinical Impression: Patient is a 70 y.o. M with history of HTN, CAD s/p CABG, CVA w/STM deficits, T2DM, B-BKAs, CKD IV who has refused HD in the past and was admitted on 01/11/21 with N/V and bilateral LE edema due to acute on chronic CHF. History taken from chart review and patient. Echocardiogram with EF of 30-35% and global hypokinesis. He was evaluated by Dr. Terrence Dupont and was started on IV lasix for diuresis.  EKG changes felt to be due to NSVT and was started on amiodarone for rate control.  Elevated cardiac enzymes felt to be due to demand ischemia and patient with cardiorenal syndrome with symptoms of uremia. HD initiated and AVG placed by Dr. Scot Dock on 01/19/21 and post op has had edema RUE but without numbness or tingling.     He was scheduled for discharge but became unresponsive on 01/22/21, was incontinent of B/B and confused when aroused. Dr. Cheral Marker consulted due to concerns of seizure activity and exam revealed asterixis likely due to metabolic derangements and  questioned seizure due to low calcium. MRI brain done for work up and showed small acute left pontine infarct. 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     EEG was suggestive of mild diffuse encephalopathy without seizure or epileptiform discharges.    Stroke felt to be due to small/large vessel disease but cardioembolic source not ruled out. Dr. Erlinda Hong recommended ASA/plavix X 3 weeks followed by Plavix alone as well as 30 day cardiac event monitor to rule out A fib. .  Patient with limitations in mobility and ADLs. CIR recommended due to functional decline. Patient transferred to CIR on 01/25/2021 .   Patient currently requires max assist with mobility secondary to muscle weakness, decreased cardiorespiratoy endurance and decreased oxygen support, unbalanced muscle activation, decreased midline orientation and decreased attention to left, decreased awareness and decreased safety awareness, and decreased sitting balance, decreased standing balance, decreased balance strategies, and hemipareisis .  Prior to hospitalization, patient was modified independent  with mobility and lived with Other (Comment) in a House home.  Home access is 4Stairs to enter.  Patient will benefit from skilled PT intervention to maximize safe functional mobility, minimize fall risk, and decrease caregiver burden for planned discharge home with 24 hour assist.  Anticipate patient will benefit from follow up [facility] at discharge.  PT - End of Session Activity Tolerance: Tolerates 10 - 20 min activity with multiple rests Endurance Deficit: Yes PT Assessment Rehab Potential (ACUTE/IP ONLY): Fair PT Barriers to Discharge: Inaccessible home environment;Decreased caregiver support;Home environment access/layout;Wound Care;Lack of/limited family support;Insurance for SNF coverage;Weight;Hemodialysis;Medication compliance;Behavior;Nutrition means PT Patient demonstrates impairments in the following area(s): Balance;Behavior;Edema;Endurance;Motor;Nutrition;Pain;Safety;Perception;Sensory;Skin Integrity PT Transfers Functional Problem(s): Bed Mobility;Bed to Chair;Car;Furniture PT Locomotion Functional Problem(s): Ambulation;Wheelchair Mobility;Stairs PT Plan PT Intensity: Minimum of 1-2 x/day ,45 to 90 minutes PT Frequency: 5 out of 7 days PT Duration Estimated Length of Stay: 10-14 days PT Treatment/Interventions: Ambulation/gait training;DME/adaptive equipment instruction;Community  reintegration;Neuromuscular re-education;Psychosocial support;Stair training;UE/LE Strength taining/ROM;Wheelchair propulsion/positioning;UE/LE Coordination activities;Therapeutic Activities;Skin care/wound management;Pain management;Functional electrical stimulation;Balance/vestibular training;Discharge planning;Cognitive remediation/compensation;Functional mobility training;Patient/family education;Disease management/prevention;Splinting/orthotics;Therapeutic Exercise;Visual/perceptual remediation/compensation PT Transfers Anticipated Outcome(s): CGA/ MinA PT Locomotion Anticipated Outcome(s): CGA/ MinA PT Recommendation Recommendations for Other Services: Therapeutic Recreation consult;Other (comment) Therapeutic Recreation Interventions: Kitchen group;Stress management;Outing/community reintergration Follow Up Recommendations: Home health PT;Outpatient PT Patient destination: Home Equipment Recommended: To be determined Equipment Details: pt states he has a w/c but unsure of size and condition; also states he has a RW, has hurrycane in room   PT Evaluation Precautions/Restrictions Precautions Precautions: Fall;Other (comment) Precaution Comments: bilateral BKA Required Braces or Orthoses: Other Brace Other Brace: Bilateral prostheses Restrictions Weight Bearing Restrictions: No General   Vital SignsTherapy Vitals Temp: 98 F (36.7 C) Pulse Rate: 67 Resp: 14 BP: (!) 103/53 Patient Position (if appropriate): Lying Oxygen Therapy SpO2: 99 % O2 Device: Room Air Pain Pain Assessment Pain Scale: 0-10 Pain Score: 0-No pain Pain Interference Pain Interference Pain Effect on Sleep: 1. Rarely or not at all Pain Interference with Therapy Activities: 1. Rarely or not at all Pain Interference with Day-to-Day Activities: 1. Rarely or not at all Home Living/Prior [place] Available Help at Discharge: Family (ex-wife) Type of Home: House Home Access: Stairs to  enter Technical brewer of Steps: 4 Entrance Stairs-Rails: Right Home Layout: One level Bathroom Shower/Tub: Other (comment) (sponge bathes at sink) Bathroom Toilet: Standard Additional Comments: reports his w/c arm is broken, gave his BSC away Prior Function Level of Independence: Independent with basic ADLs;Independent with homemaking with ambulation;Independent with gait;Independent with transfers;Requires  assistive device for independence  Able to Take Stairs?: Yes Driving: No Vision/Perception  Vision - History Ability to See in Adequate Light: 1 Impaired Perception Perception: Within Functional Limits Praxis Praxis: Intact  Cognition Overall Cognitive Status: No family/caregiver present to determine baseline cognitive functioning Arousal/Alertness: Awake/alert Orientation Level: Oriented X4 Memory:  (further testing needed) Awareness: Impaired Awareness Impairment: Emergent impairment Executive Function: Initiating Initiating: Impaired Initiating Impairment: Functional basic Behaviors: Poor frustration tolerance (Pt appears functional for basic tasks but demonstrates frustration throughout evaluation - unable to determine if d/t recent surgery for AVG placement with resultant swelling/ pain in LUE or if 2/2 stroke) Safety/Judgment: Impaired (further testing needed) Comments: Limited cognitive assessment obtained due to minimal participation. Pt appeared functional for basic tasks Sensation Sensation Light Touch: Appears Intact Additional Comments: Pt's skin extremely dry with large flakes on BLE - appears that LT is intact but will need further assessment Coordination Gross Motor Movements are Fluid and Coordinated: Yes Fine Motor Movements are Fluid and Coordinated: No Heel Shin Test: NT - B BKA Motor  Motor Motor: Other (comment) (Hemipareisis) Motor - Skilled Clinical Observations: mild L hemipareisis, lethargic and slow to attain balance in stance    Trunk/Postural Assessment  Cervical Assessment Cervical Assessment: Exceptions to [facility] (forward head) Thoracic Assessment Thoracic Assessment: Exceptions to [facility] (rounded shoulders) Lumbar Assessment Lumbar Assessment: Exceptions to [facility] (posterior pelvic tilt in sitting) Postural Control Postural Control: Deficits on evaluation (mild balance deficit during seated mobility; requires physical assist for forward/ lateral scoot in sitting.)  Balance Balance Balance Assessed: Yes Static Sitting Balance Static Sitting - Balance Support: Bilateral upper extremity supported;Feet unsupported Static Sitting - Level of Assistance: 5: Stand by assistance;4: Min assist Dynamic Sitting Balance Dynamic Sitting - Balance Support: No upper extremity supported;During functional activity Dynamic Sitting - Level of Assistance: 3: Mod assist;2: Max assist Dynamic Sitting - Balance Activities: Lateral lean/weight shifting;Forward lean/weight shifting Sitting balance - Comments: pt dependent on BUE support,demos posterior bias and mild L lateral lean with fatigue Static Standing Balance Static Standing - Balance Support: Bilateral upper extremity supported (prostheics donned) Static Standing - Level of Assistance: 4: Min assist Dynamic Standing Balance Dynamic Standing - Balance Support: Bilateral upper extremity supported Dynamic Standing - Level of Assistance: 4: Min assist;3: Mod assist Dynamic Standing - Balance Activities: Lateral lean/weight shifting;Forward lean/weight shifting Extremity Assessment      RLE Assessment RLE Assessment: Exceptions to [facility] General Strength Comments: no formal MMT d/t pt's frustration level; functional strength demonstrated at 4- to 4/ 5 LLE Assessment LLE Assessment: Exceptions to [facility] General Strength Comments: no formal MMT d/t pt's frustration level; increased weakness compared to RLE - functional strength demonstrated at 4-/ 5  Care Tool Care Tool Bed  Mobility Roll left and right activity   Roll left and right assist level: Minimal Assistance - Patient > 75%    Sit to lying activity   Sit to lying assist level: Minimal Assistance - Patient > 75%    Lying to sitting on side of bed activity   Lying to sitting on side of bed assist level: the ability to move from lying on the back to sitting on the side of the bed with no back support.: Minimal Assistance - Patient > 75%     Care Tool Transfers Sit to stand transfer   Sit to stand assist level: 2 Helpers    Chair/bed transfer   Chair/bed transfer assist level: Moderate Assistance - Patient 50 - 74%  Materials engineer transfer activity did not occur: Safety/medical Personal assistant transfer activity did not occur: Safety/medical concerns        Care Tool Locomotion Ambulation Ambulation activity did not occur: Safety/medical concerns        Walk 10 feet activity Walk 10 feet activity did not occur: Safety/medical concerns       Walk 50 feet with 2 turns activity Walk 50 feet with 2 turns activity did not occur: Safety/medical concerns      Walk 150 feet activity Walk 150 feet activity did not occur: Safety/medical concerns      Walk 10 feet on uneven surfaces activity Walk 10 feet on uneven surfaces activity did not occur: Safety/medical concerns      Stairs Stair activity did not occur: Safety/medical concerns        Walk up/down 1 step activity Walk up/down 1 step or curb (drop down) activity did not occur: Safety/medical concerns      Walk up/down 4 steps activity Walk up/down 4 steps activity did not occur: Safety/medical concerns      Walk up/down 12 steps activity Walk up/down 12 steps activity did not occur: Safety/medical concerns      Pick up small objects from floor Pick up small object from the floor (from standing position) activity did not occur: Safety/medical concerns      Wheelchair Is the patient using a wheelchair?:  Yes Type of Wheelchair: Manual   Wheelchair assist level: Total Assistance - Patient < 25% Max wheelchair distance: 30 ft  Wheel 50 feet with 2 turns activity Wheelchair 50 feet with 2 turns activity did not occur: Safety/medical concerns    Wheel 150 feet activity Wheelchair 150 feet activity did not occur: Safety/medical concerns      Refer to Care Plan for Long Term Goals  SHORT TERM GOAL WEEK 1 PT Short Term Goal 1 (Week 1): Pt will demonstrate bed mobility with consistent CGA. PT Short Term Goal 2 (Week 1): Pt will don BLE prosthetics with all necessary parts for proper fit with MinA. PT Short Term Goal 3 (Week 1): Pt will perform sit<>stand  and pivot transfers with consistent Min/ ModA PT Short Term Goal 4 (Week 1): Pt will initiate gait training. PT Short Term Goal 5 (Week 1): Pt will initiate w/c mobility and parts management training.  Recommendations for other services: Surveyor, mining group, Stress management, and Outing/community reintegration and Other: CPO evaluation of BLE prosthetics.  Skilled Therapeutic Intervention Mobility Bed Mobility Bed Mobility: Supine to Sit;Sitting - Scoot to Edge of Bed Supine to Sit: Contact Guard/Touching assist Sitting - Scoot to Edge of Bed: Moderate Assistance - Patient 50-74% Transfers Transfers: Sit to Stand;Stand to [employer] Transfers Sit to Stand: Maximal Assistance - Patient 25-49%;2 Helpers Stand to Sit: Maximal Assistance - Patient 25-49% Stand Pivot Transfers: Minimal Assistance - Patient > 75%;2 Secondary school teacher Details: Tactile cues for weight shifting;Verbal cues for sequencing;Verbal cues for technique;Verbal cues for precautions/safety;Verbal cues for safe use of DME/AE Transfer (Assistive device): Rolling walker (BLE prosthetics) Locomotion  Gait Ambulation: No Gait Gait: No Stairs / Additional Locomotion Stairs: No Wheelchair Mobility Wheelchair Mobility: No  PT Evaluation  completed; see above for results. PT educated patient in roles of PT vs OT, PT POC, rehab potential, rehab goals, and discharge recommendations along with recommendation for follow-up rehabilitation services. Individual treatment initiated:  Patient supine in bed upon PT arrival. Patient alert and  initially very agitated/ frustrated with "being bothered" all morning when he believes he should be allowed to heal his LUE first. LUE swollen from fingertips to shoulder complex. Pt c/o pain from swelling and points to surgical site from AVG placement which is healing well despite swelling. Education provided re: role of therapies to assist with pain and reduction in swelling while also focusing on assisting pt with improving ability to mobilize so that he can return home at a level better suited for pt to perform at optimal level and allow any physical assist to be minimized. All with safety being the highest priority. Time required to locate appropriate bari drop arm BSC, RW, and 22x18" w/c. Pt informed that w/c to remain at taller height until strength returns, then height to be lowered in order to progress LE strengthening and ability to transfer to/ from home height and community chairs.   No pain complaint during session despite original pain complaint at LUE. SpO2 throughout [facility]. Pt provided with extension tubing for improved positioning in room to sit in w/c.   Therapeutic Activity: Bed Mobility: Patient performed supine --> sit with CGA/ MinA. Requires Mod/ MaxA to perofrm forward scoot to reach EOB. Provided verbal cues for technique. Seated balance challenged with no floor support to LE. Post bias with intermittent lean to L with fatigue. Pt able to don gel pin liner with setup. Pt attempts to don LE prosthetics in seated position with prosthetics on floor but requires Max A from therapist with Bil knees in extension in order to don correctly. Numerous clicks noted bilaterally. Despite cues from  therapist, pt dons prosthetics without socket liners in place initially. And during transfer, prosthetic LE s noted to be loose with extreme lateral swivel play.  Transfers: Patient performed sit <> stand with Mod/ MaxA +2 and extreme forward flexion bias over top of RW. Provided vc/tc for upright posture prior to stand pivot to w/c. Stand pivot required LLE assist and +2 for safety but improved physical assist to Min/ ModA for balance/ safety. LLE required MinA for advancement/ placement.  Patient seated upright  in w/c at end of session with brakes locked, no alarm set as NT, RN, and SW all in room with pt at time and all needs within reach. Discussion with NT re: need for skin care to pt's LE when prosthetics doffed in order to improve skin integrity with continued prosthetic use as pt has not donned/ walked in weeks (per pt).    Discharge Criteria: Patient will be discharged from PT if patient refuses treatment 3 consecutive times without medical reason, if treatment goals not met, if there is a change in medical status, if patient makes no progress towards goals or if patient is discharged from hospital.  The above assessment, treatment plan, treatment alternatives and goals were discussed and mutually agreed upon: by patient  Alger Simons PT, DPT 01/26/2021, 6:55 PM

## 2021-01-27 NOTE — Progress Notes (Signed)
Patient ID: [name], male   DOB: 08-03-1950, 70 y.o.   MRN: [id number]  Met with pt and left message for Edna-ex-wife to update regarding team conference goals of min assist and target discharge of 12/7. Pt is not sure she will assist him. Discussed he can not go to a NH now since he came here. He has been to several NH's in the past for rehab. Will await Edna's return call.

## 2021-01-27 NOTE — Progress Notes (Signed)
Occupational Therapy Session Note  Patient Details  Name: [name] MRN: [id number] Date of Birth: [birth year]  Today's Date: 01/27/2021 OT Individual Time: 9562-1308 and [id number] OT Individual Time Calculation (min): 56 min and 24 min   Short Term Goals: Week 1:  OT Short Term Goal 1 (Week 1): PT will complete BSC or toilet transfer with 1 assist and LRAD OT Short Term Goal 2 (Week 1): Pt will don B prosthetics with supervision assistance OT Short Term Goal 3 (Week 1): Pt will sit EOB ~20 minutes during self care activity before returning to bed to rest in order to increase functional activity tolerance  Skilled Therapeutic Interventions/Progress Updates:    Session 1: Pt greeted at time of session semireclined in bed resting. Discussed with pt role and purpose of OT and pt agreeable to ADL tasks. Multiple attempts to encourage pt to don pants bed level for safety and repeatedly declining. Pt also requesting to perform a squat pivot bed > wheelchair with no prosthetics on (pt is B BKA) prior to putting on pants as this is what he did at home. Pt educated that this would not be a safe option, continued to decline OT methods. Pt eventually agreeable to transfer with stedy after prosthetics donned. Pt did not have on a brief, donned brief bed level dependent with pt rolling to assist with placement. Supine > sit Min A and extended time to scoot to EOB. Donned prosthetics with Max A despite encouraging pt to do this task without help. Sit <> stand in stedy with Max A of 1, stedy transfer to wheelchair. Encouraged pt to don pants "like he does at home" and pt unable to do so. Total A for donning pants using stedy in standing. Up in chair alarm on call bell in reach and on O2 at 2L.   Session 2: Pt greeted at time of session semireclined in bed sleeping on O2, arousable with verbal stimuli but very fatigued. Agreeable to allow OT to K tape LUE 2/2 edema. Initial part of session on cleansing LUE skin  to ensure a good surface, patting dry. Retrograde edema performed on LUE with pt ed on technique, good results in swelling reduction. K tape applied to L forearm, wrist, digits to improve edema management. Pt educated to ask for removal if irritation occurs. In bed resting alarm on call bell in reach.   Therapy Documentation Precautions:  Precautions Precautions: Fall, Other (comment) Precaution Comments: bilateral BKA Required Braces or Orthoses: Other Brace Other Brace: Bilateral prostheses Restrictions Weight Bearing Restrictions: No      Therapy/Group: Individual Therapy  Viona Gilmore 01/27/2021, 7:21 AM

## 2021-01-27 NOTE — Patient Care Conference (Signed)
Inpatient RehabilitationTeam Conference and Plan of Care Update Date: 01/27/2021   Time: 11:36 AM    Patient Name: [name]      Medical Record Number: [id number]  Date of Birth: [birth year] Sex: Male         Room/Bed: 4M10C/4M10C-01 Payor Info: Payor: [employer] / Plan: [facility] MEDICARE / Product Type: *No Product type* /    Admit Date/Time:  01/25/2021  3:09 PM  Primary Diagnosis:  Left pontine stroke [facility])  Hospital Problems: Principal Problem:   Left pontine stroke [facility]) Active Problems:   Debility    Expected Discharge Date: Expected Discharge Date: 02/11/21  Team Members Present: Physician leading conference: Dr. Courtney Heys Social Worker Present: Ovidio Kin, LCSW Nurse Present: Dorthula Nettles, RN PT Present: Ailene Rud, PT OT Present: Lillia Corporal, OT SLP Present: Sherren Kerns, SLP PPS Coordinator present : Gunnar Fusi, SLP     Current Status/Progress Goal Weekly Team Focus  Bowel/Bladder   Pt is cont of bowel and anuric. LBM 11/20  Pt to remain cont of bowel  Help pt remain cont   Swallow/Nutrition/ Hydration   sup A  mod I  Tolerance of Dys 2 textures, thin liquids. Pills whole in puree   ADL's   Mod A UB ADLs, Max/total LB bathe/dress, Stedy transfer, very stubborn/refuses education and techniques  Min A overall  adaptive self care skills, dynamic balance, functional transfers, activity tolerance, OOB tolerance   Mobility   bed mobility CGA/MinA,, transfers = MaxA, no gait yet  SUP for bed mobility, CGA/ MinA for transfers, MinA for gait/ stairs, SUP for w/c mobility  gait, transfers, w/c mobility   Communication   [employer] [facility]         Safety/Cognition/ Behavioral Observations  Min-to-mod A - limited participation  sup A  intellectual and emergent awareness, problem solving, functional recall   Pain   Pt denies pain  pt remain pain free  Assess for pain qshift   Skin   Pt has bilateral BKA (2013)  Pt skin to remain intact with no new  breakdown  Skin care qshift     Discharge Planning:  Home with ex-wife and son's to assist, will need to confirm if will provide min assist level   Team Discussion: B-BKA, new ESRD, CVA. Elevate LUE, K-tape hand. Very stubborn and has his own way of doing things. Can be rude to staff. Incontinent of bowel, will implement timed toileting schedule. New oxygen. Currently mod assist upper body, max assist lower body. Has prolonged mastication due to dentures lost on acute. Does better with unstructured task. Has impaired awareness. Patient on target to meet rehab goals: Min assist overall goals. Supervision goals with SLP.   *See Care Plan and progress notes for long and short-term goals.   Revisions to Treatment Plan:  Maintain IV for now.   Teaching Needs: Family education, medication management, skin/wound care, safety awareness, transfer training, etc.  Current Barriers to Discharge: Decreased caregiver support, Home enviroment access/layout, Incontinence, Wound care, Hemodialysis, Weight bearing restrictions, Medication compliance, Behavior, and New oxygen  Possible Resolutions to Barriers: Family education New Oxygen education Elevate LUE Establish outpatient Hemodialysis Recommended DME Follow up OT/PT/SLP     Medical Summary Current Status: new ESRD- making him exhausted- LUE swollen after fistula placement- inconitninence of bowel- anuric  Barriers to Discharge: Decreased family/caregiver support;Home enviroment access/layout;Hemodialysis;Medical stability;Incontinence;Weight bearing restrictions;Weight;Wound care;Other (comments);New oxygen;Medication compliance;Behavior  Barriers to Discharge Comments: lives with ex-wife; don't know if has dispo- resistance to safe  ways of doing things- Possible Resolutions to [employer]: biggest barrier is rudeness/unsafe; wants to do squat pivot transfer as B/L BKAs- wean new O2; cognitive assessment- needs to get him to do SLP-  min-mod cognitive impairment- needs to SEE what he cannot do- did partial SLUMS- 9-10/15 did partial- STM issues/problem solving- cognition main impairment- and safety awareness- lost bottom dentures-  refusing all techniques- 2.5 weeks- around 12/7   Continued Need for Acute Rehabilitation Level of Care: The patient requires daily medical management by a physician with specialized training in physical medicine and rehabilitation for the following reasons: Direction of a multidisciplinary physical rehabilitation program to maximize functional independence : Yes Medical management of patient stability for increased activity during participation in an intensive rehabilitation regime.: Yes Analysis of laboratory values and/or radiology reports with any subsequent need for medication adjustment and/or medical intervention. : Yes   I attest that I was present, lead the team conference, and concur with the assessment and plan of the team.   Cristi Loron 01/27/2021, 4:05 PM

## 2021-01-27 NOTE — Progress Notes (Signed)
PROGRESS NOTE   Subjective/Complaints:   Pt reports not nauseated (has nausea bag on bed); Concerned about swelling of LUE ever since fistula placed.  Had HD yesterday- per Dr Posey Pronto is due to foreign body reaction.  Slept well; sleepy;  LBM yesterday.   ROS:  Pt denies SOB, abd pain, CP, N/V/C/D, and vision changes   Objective:   No results found. Recent Labs    01/25/21 1535 01/26/21 1500  WBC 11.8* 9.0  HGB 7.8* 7.5*  HCT 24.9* 23.6*  PLT 195 209   Recent Labs    01/25/21 0149 01/25/21 1535  NA 133* 131*  K 3.8 3.7  CL 96* 96*  CO2 26 24  GLUCOSE 168* 150*  BUN 31* 38*  CREATININE 4.56* 5.18*  CALCIUM 8.3* 8.1*    Intake/Output Summary (Last 24 hours) at 01/27/2021 0911 Last data filed at 01/27/2021 0755 Gross per 24 hour  Intake 600 ml  Output 1000 ml  Net -400 ml        Physical Exam: Vital Signs Blood pressure (!) 103/53, pulse 67, temperature 98 F (36.7 C), resp. rate 14, height 5\' 8"  (1.727 m), weight 114 kg, SpO2 99 %.     General: awake, alert, appropriate, ate 100% tray; NAD HENT: conjugate gaze; oropharynx moist CV: regular rate; no JVD Pulmonary: CTA B/L; no W/R/R- good air movement GI: soft, NT, ND, (+)BS Psychiatric: appropriate; flat Neurological: alert Skin- LUE swollen- not elevated on pillow- hand puffy as well as forearm and upper arm below fistula- no erythema or heat- just swelling Musculoskeletal:     Cervical back: Normal range of motion and neck supple.     Comments: B/l BKA with mild tenderness, mild edema  Skin:    General: Skin is warm and dry.  Neurological:     Mental Status: He is alert.     Comments: Alert Dysarthria Motor: Grossly 4/5, left weaker than right   Assessment/Plan: 1. Functional deficits which require 3+ hours per day of interdisciplinary therapy in a comprehensive inpatient rehab setting. Physiatrist is providing close team supervision  and 24 hour management of active medical problems listed below. Physiatrist and rehab team continue to assess barriers to discharge/monitor patient progress toward functional and medical goals  Care Tool:  Bathing    Body parts bathed by patient: Right arm, Left arm, Chest, Abdomen, Front perineal area, Right upper leg, Left upper leg, Face   Body parts bathed by helper: Buttocks Body parts n/a: Right lower leg, Left lower leg   Bathing assist Assist Level: Minimal Assistance - Patient > 75%     Upper Body Dressing/Undressing Upper body dressing   What is the patient wearing?: Button up shirt    Upper body assist Assist Level: Moderate Assistance - Patient 50 - 74%    Lower Body Dressing/Undressing Lower body dressing    Lower body dressing activity did not occur: Refused       Lower body assist       Toileting Toileting Toileting Activity did not occur (Clothing management and hygiene only): N/A (no void or bm)  Toileting assist       Transfers Chair/bed transfer  Transfers assist  Chair/bed transfer assist level: Moderate Assistance - Patient 50 - 74%     Locomotion Ambulation   Ambulation assist   Ambulation activity did not occur: Safety/medical concerns          Walk 10 feet activity   Assist  Walk 10 feet activity did not occur: Safety/medical concerns        Walk 50 feet activity   Assist Walk 50 feet with 2 turns activity did not occur: Safety/medical concerns         Walk 150 feet activity   Assist Walk 150 feet activity did not occur: Safety/medical concerns         Walk 10 feet on uneven surface  activity   Assist Walk 10 feet on uneven surfaces activity did not occur: Safety/medical concerns         Wheelchair     Assist Is the patient using a wheelchair?: Yes Type of Wheelchair: Manual    Wheelchair assist level: Total Assistance - Patient < 25% Max wheelchair distance: 30 ft    Wheelchair 50  feet with 2 turns activity    Assist    Wheelchair 50 feet with 2 turns activity did not occur: Safety/medical concerns       Wheelchair 150 feet activity     Assist  Wheelchair 150 feet activity did not occur: Safety/medical concerns       Blood pressure (!) 103/53, pulse 67, temperature 98 F (36.7 C), resp. rate 14, height 5\' 8"  (1.727 m), weight 114 kg, SpO2 99 %.  Medical Problem List and Plan: 1.  Deficits with mobility, self-care, transfers, endurance secondary to L pontine stroke              -patient may not shower due to HD catheter             -ELOS/Goals: 10-14 days/Min A            Continue CIR- PT, OT and SLP  Team conference today to determine d/c date.  2.  Antithrombotics: -DVT/anticoagulation:  Mechanical: Sequential compression devices, below knee Bilateral lower extremities             -antiplatelet therapy: DAPT X 3 weeks followed by ASA alone.  3. Pain Management: Tylenol prn.  4. Mood: LCSW to follow for evaluation and support.              -antipsychotic agents: N/A 5. Neuropsych: This patient is ?fully capable of making decisions on his own behalf. 6. Skin/Wound Care: Routine pressure relief measures.  7. Fluids/Electrolytes/Nutrition: Strict I/O. 1200 cc FR. Renal diet' 8. 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 Demand ischemia/NSVT: On amiodarone daily 10. Thrombocytopenia: Platelets recovering from drop to 76-->108  11/21- Plts 208 today- con't to monitor 11. Anemia of chronic disease: ON Aranesp weekly for supplement.              CBC ordered for tomorrow  11/21- Hb a little decreased at 7.5- will recheck per renal  12. T2DM  with hyperglycemia: Hgb  A1C-6.8.  Monitor BS ac/hs.  --Continue SSI as well as 4  units novolog TID with meals 11/22- BG 102- 173- slightly variable, but had snacks on table- con't regimen Monitor with increased mobility 13. Small acute left pontine infarct             See #2 14. S/p b/l BKA- chronic             Continue to monitor    LOS: 2 days A FACE TO FACE EVALUATION WAS PERFORMED  Daliana Leverett 01/27/2021, 9:11 AM

## 2021-01-27 NOTE — Progress Notes (Signed)
Patient ID: [name], male   DOB: 1950-06-27, 70 y.o.   MRN: [id number]  [facility] Progress Note   Assessment/ Plan:   1.  Deconditioning/mobility deficits: Following hospitalization and small acute left pontine infarct.  Appears to be improving status post initiation of dialysis and ongoing PT now in inpatient rehabilitation unit. 2. ESRD: From underlying progressive chronic kidney disease and exacerbated by cardiorenal syndrome now started on hemodialysis on a TTS schedule.  He is status post left upper arm AV graft placement on 11/14 and is getting dialysis via right IJ TDC; his graft should be suitable to cannulate after 12/12.  He appears to have significant left upper arm edema possibly from venous congestion/postsurgical inflammation and edema-discussed elevating arm and will challenge ultrafiltration goal (if this does not correct, he will need central venogram).  He has been accepted to [facility] upon discharge.  3. Anemia: Without overt blood loss, continue ESA and monitoring hemoglobin/hematocrit trend.  PRBC transfusion if hemoglobin <7.0. 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 5. Nutrition: Continue renal diet with ongoing renal multivitamin supplementation.  Continue nutritional supplementation as indicated by albumin levels. 6. Hypertension: Blood pressure acceptable, continue to monitor with dialysis.  Subjective:   Denies any chest pain or shortness of breath but concerned about left arm swelling   Objective:   BP (!) 103/53 (BP Location: Right Arm)   Pulse 67   Temp 98 F (36.7 C)   Resp 14   Ht 5\' 8"  (1.727 m)   Wt 114 kg Comment: bed scale  SpO2 99%   BMI 38.21 kg/m   Physical Exam: Gen: Appears comfortable resting in bed, Dr. Dagoberto Ligas at bedside assessing him CVS: Pulse regular rhythm, normal rate, S1 and S2 normal.  Right IJ TDC in place Resp: Clear to auscultation  bilaterally, no rales/rhonchi Abd: Soft, obese, nontender, bowel sounds normal Ext: Status post bilateral below-knee amputations with socks over stumps.  Swollen left upper extremity with audible bruit over graft.  Labs: BMET Recent Labs  Lab 01/22/21 0348 01/22/21 1854 01/22/21 1902 01/23/21 0054 01/24/21 0151 01/25/21 0149 01/25/21 1535  NA 132* 132* 134* 134* 130* 133* 131*  K 4.2 3.8 3.5 4.1 4.4 3.8 3.7  CL 97* 95*  --  95* 94* 96* 96*  CO2 23 23  --  23 24 26 24   GLUCOSE 163* 153*  --  87 232* 168* 150*  BUN 64* 34*  --  39* 53* 31* 38*  CREATININE 7.08* 4.56*  --  4.96* 6.39* 4.56* 5.18*  CALCIUM 8.1* 8.1*  --  8.3* 8.1* 8.3* 8.1*  PHOS  --   --   --  3.9  --   --  4.3   CBC Recent Labs  Lab 01/22/21 1910 01/23/21 0054 01/24/21 0151 01/25/21 0149 01/25/21 1535 01/26/21 1500  WBC 10.6* 11.5* 12.5* 12.5* 11.8* 9.0  NEUTROABS 6.5 9.1*  --   --   --   --   HGB 8.6* 8.7* 8.0* 7.6* 7.8* 7.5*  HCT 26.0* 27.1* 25.4* 24.0* 24.9* 23.6*  MCV 91.5 90.9 91.7 92.7 94.0 94.0  PLT 107* 108* 155 165 195 209      Medications:     (feeding supplement) PROSource Plus  30 mL Oral BID BM   amiodarone  200 mg Oral Daily   aspirin EC  81 mg Oral q morning   atorvastatin  80 mg Oral QHS  Chlorhexidine Gluconate Cloth  6 each Topical Q0600   clopidogrel  75 mg Oral Daily   [START ON 01/29/2021] darbepoetin (ARANESP) injection - DIALYSIS  60 mcg Intravenous Q Thu-HD   feeding supplement (NEPRO CARB STEADY)  237 mL Oral BID BM   gabapentin  300 mg Oral QHS   insulin aspart  0-15 Units Subcutaneous TID WC   insulin aspart  4 Units Subcutaneous TID WC   mouth rinse  15 mL Mouth Rinse BID   multivitamin  1 tablet Oral QHS   pantoprazole  40 mg Oral Q0600   Elmarie Shiley, MD 01/27/2021, 7:42 AM

## 2021-01-27 NOTE — Progress Notes (Signed)
Speech Language Pathology Daily Session Note  Patient Details  Name: [name] MRN: [id number] Date of Birth: Dec 13, 1950  Today's Date: 01/27/2021 SLP Individual Time: 2419-9144 SLP Individual Time Calculation (min): 45 min  Short Term Goals: Week 1: SLP Short Term Goal 1 (Week 1): Patient will participate in further cognitive-communication assessment SLP Short Term Goal 1 - Progress (Week 1): Met SLP Short Term Goal 2 (Week 1): Patient will consume current diet with effective mastication and oral clearance and without overt s/sx of aspiration with sup A verbal cues for implementation of swallow safety SLP Short Term Goal 3 (Week 1): Patient will consume Dys 3 PO trials with improved mastication timeliness and effectiveness and without overt s/sx of aspiration SLP Short Term Goal 4 (Week 1): Patient will verbalize 2-3 physical and cognitive changes s/p CVA and how these changes may impact his functioning and safety upon discharge with min A verbal cues SLP Short Term Goal 5 (Week 1): Patient will complete mildly complex problem solving tasks with min A verbal cues  Skilled Therapeutic Interventions: Skilled ST treatment focused on cognitive goals. Pt observed consuming whole pills one at a time in puree with timely and complete oral clearance and without overt s/sx of aspiration. Pt was agreeable to additional cognitive-linguistic assessment as continuation from yesterday's initial encounter. Pt initially participated in the [place] Mental Status Examination (SLUMS) however denied consideration to proceed halfway through assessment and exhibited increased frustration in the rehab process overall by stating "you all just need to let me get well" and "stop telling me what to do." Pt was oriented to all concepts, he recalled 4/5 words following short (3-5 minute delay), and named 10 animals in one minute for generative naming task. He exhibited difficulty with calculations and mental  manipulation. He would not complete clock drawing, paragraph recall, or visuospatial items. Pt scored 9/16 with adjusted score. Pt appeared to exhibit at least mild cognitive impairment in the areas of short-term recall, problem solving, and intellectual awareness. Pt consistently exhibited difficulty identifying and elaborating on changes s/p CVA. His responses were very general including "I just need to get better." Pt did express "my mind is working slower" s/p CVA and endorsed cognitive deficits from prior CVA though unable to elaborate. SLP reviewed results of assessment and recommendations for plan of care including cognitive-communication and swallowing intervention. Patient verbally agreed to proceed with services. Suspect progress may be limited due to resistance/low frustration tolerance and self limiting behaviors which were observed during yesterday and today's encounter. Will continue to monitor progression toward goals and compliance to intervention for appropriateness for SLP services. Patient was left in bed with alarm activated and immediate needs within reach at end of session. Continue per current plan of care.      Pain Pain Assessment Pain Scale: 0-10 Pain Score: 0-No pain  Therapy/Group: Individual Therapy  Patty Sermons 01/27/2021, 11:49 AM

## 2021-01-27 NOTE — Progress Notes (Signed)
Inpatient Rehabilitation Admission Medication Review by a Pharmacist  A complete drug regimen review was completed for this patient to identify any potential clinically significant medication issues.  High Risk Drug Classes Is patient taking? Indication by Medication  Antipsychotic Yes Compazine for N/V  Anticoagulant No   Antibiotic No   Opioid Yes Oxycodone for severe pain  Antiplatelet Yes Plavix / Aspirin for CVA  Hypoglycemics/insulin Yes Insulin for DM  Vasoactive Medication Yes Amiodarone for V tach  Chemotherapy No   Other Yes Aranesp for anemia Lipitor for HLD Protonix for GERD     Type of Medication Issue Identified Description of Issue Recommendation(s)  Drug Interaction(s) (clinically significant)     Duplicate Therapy     Allergy     No Medication Administration End Date     Incorrect Dose     Additional Drug Therapy Needed     Significant med changes from prior encounter (inform family/care partners about these prior to discharge).    Other       Clinically significant medication issues were identified that warrant physician communication and completion of prescribed/recommended actions by midnight of the next day:  No  Pharmacist comments: Semaglutide on hold  Time spent performing this drug regimen review (minutes): 20 minutes   Tad Moore 01/26/2021 7:53 AM

## 2021-01-27 NOTE — Progress Notes (Signed)
PMR Admission Coordinator Pre-Admission Assessment   Patient: [name] is an 70 y.o., male MRN: [id number] DOB: 10/21/1950 Height: 5' 8"  (172.7 cm) Weight: 102.5 kg   Insurance Information HMO: yes    PPO:      PCP:      IPA:      80/20:      OTHER:  PRIMARY: U[employer] Medicare      Policy#: [id number]      Subscriber: Pt CM Name:  Amedeo Gory     Phone#:      Fax#: [phone] Pre-Cert#: [id number]  approved on 01/22/21 with update due on 01/28/21    Employer: none Benefits:  Phone #: portal     Name:  Eff Date: 03/08/2020 - present Deductible: $0 (does not have deductible) OOP Max: $3,600 ($524.06 met) CIR: $295/day co-pay for days 1-5, $0/day co-pay for days 6+ SNF: $0.00 Copayment per day for days 1-20; $188.00 Copayment per day for days 21-40; $0.00 Copayment per day for days 41-100 for Medicare-covered care/maximum 100 days/benefit period Outpatient: $30/visit co-pay Home Health:  100% coverage; limited by medical necessity DME: 80% coverage; 20% co-insurance Providers: in network   SECONDARY:       Policy#:      Phone#:      The "Data Collection Information Summary" for patients in Inpatient Rehabilitation Facilities with attached "Privacy Act [place] Records" was provided and verbally reviewed with: Patient   Emergency Contact Information Contact Information       Name Relation Home Work Mobile    [place] Son     [phone]    Tiley,Shirley Sister [phone]        Price,Thomas Brother     [phone]    Price, Thomas     [phone]    Harjit, Leider (ex wife) Other [phone]               Current Medical History  Patient Admitting Diagnosis: Heart Failure, ARF, CVA  History of Present Illness:  [name] is an 70 y.o. 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 Cardiac enzymes were elevated, thought to be due to demand ischemia. He was diagnosed with new ESRD this admission, which was felt most likely to be due to cardiorenal syndrome. He has had 4 HD sessions so far this admission. Patient had an AV graft placed on 11/14 by Vascular Surgery. 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 On 11/17, Pt. Became acutely unresponsive to voice and sternal rub after an episode of vomiting. He was noted by RN to be biting down on his tongue which seemed pale. Seizure was suspected.Duration was approximately 3 minutes. He was noted to be incontinent of stool and urine. The patient's pulse was palpable throughout the event. He was initially confused after the spell, then relatively quickly returned to his cognitive baseline.MRI was completed a revealed a small left pontine CVA. CIR was consulted to assist in return to PLOF.      Patient's medical record from [facility] has been reviewed by the rehabilitation admission coordinator and physician.   Past Medical History      Past Medical History:  Diagnosis  Date   CHF (congestive heart failure) (HCC)     Chronic Kidney Disease IV     Exertional shortness of breath      "before my last OR; I'm fine now" (09/26/2012)   GERD (gastroesophageal reflux disease)     High cholesterol     Hx of seasonal allergies     Hypertension     Myocardial infarction [facility])     Scrotal edema 01/30/2019   Stroke [facility]) 2009    memory loss   Type II diabetes mellitus ([place])        Has the patient had major surgery during 100 days prior to admission? Yes   Family History   family history is not on file.   Current Medications   Current Facility-Administered Medications:    (feeding supplement) PROSource Plus liquid 30 mL, 30 mL, Oral, BID BM,  Angelia Mould, MD, 30 mL at 01/23/21 1012   0.9 %  sodium chloride infusion, , Intravenous, PRN, Angelia Mould, MD, Stopped at 01/13/21 1834   0.9 %  sodium chloride infusion, , Intravenous, Continuous, [place], Charisse March, MD, Last Rate: 10 mL/hr at 01/22/21 2100, New Bag at 01/22/21 2100   acetaminophen (TYLENOL) tablet 650 mg, 650 mg, Oral, Q6H PRN, 650 mg at 01/12/21 0220 **OR** acetaminophen (TYLENOL) suppository 650 mg, 650 mg, Rectal, Q6H PRN, Angelia Mould, MD   amiodarone (PACERONE) tablet 200 mg, 200 mg, Oral, Daily, Atway, Rayann N, DO, 200 mg at 01/23/21 1344   aspirin EC tablet 81 mg, 81 mg, Oral, q morning, Angelia Mould, MD, 81 mg at 01/23/21 1011   atorvastatin (LIPITOR) tablet 40 mg, 40 mg, Oral, QHS, Atway, Rayann N, DO   Chlorhexidine Gluconate Cloth 2 % PADS 6 each, 6 each, Topical, Q0600, Angelia Mould, MD, 6 each at 01/23/21 0443   clopidogrel (PLAVIX) tablet 75 mg, 75 mg, Oral, Daily, Angelia Mould, MD, 75 mg at 01/23/21 1011   Darbepoetin Alfa (ARANESP) injection 60 mcg, 60 mcg, Intravenous, Q Thu-HD, [place], Broadus John, MD, 60 mcg at 01/22/21 1105   feeding supplement (NEPRO CARB STEADY) liquid 237 mL, 237 mL, Oral, BID BM, Angelia Mould, MD, 237 mL at 01/22/21 1733   gabapentin (NEURONTIN) capsule 300 mg, 300 mg, Oral, QHS, Angelia Mould, MD, 300 mg at 01/23/21 0043   heparin sodium (porcine) injection 1,600 Units, 1.6 mL, Intravenous, Once, Velna Ochs, MD   insulin aspart (novoLOG) injection 0-15 Units, 0-15 Units, Subcutaneous, TID WC, Angelia Mould, MD, 3 Units at 01/23/21 1342   insulin aspart (novoLOG) injection 4 Units, 4 Units, Subcutaneous, TID WC, Atway, Rayann N, DO, 4 Units at 01/23/21 1343   melatonin tablet 3 mg, 3 mg, Oral, QHS PRN, Wayland Denis, MD, 3 mg at 01/21/21 2142   multivitamin (RENA-VIT) tablet 1 tablet, 1 tablet, Oral, QHS, Angelia Mould, MD, 1 tablet at  01/23/21 0043   nitroGLYCERIN (NITROSTAT) SL tablet 0.4 mg, 0.4 mg, Sublingual, Q5 min PRN, Angelia Mould, MD   ondansetron [facility]) injection 4 mg, 4 mg, Intravenous, Q8H PRN, Timothy Lasso, MD, 4 mg at 01/22/21 1915   oxyCODONE-acetaminophen (PERCOCET/ROXICET) 5-325 MG per tablet 1-2 tablet, 1-2 tablet, Oral, Q4H PRN, Angelia Mould, MD, 1 tablet at 01/21/21 1727   pantoprazole (PROTONIX) EC tablet 40 mg, 40 mg, Oral, Q0600, Angelia Mould, MD, 40 mg at 01/23/21 1012   senna-docusate (Senokot-S) tablet 1 tablet, 1 tablet, Oral, QHS PRN, Angelia Mould, MD, 1  tablet at 01/16/21 1600   sodium bicarbonate tablet 650 mg, 650 mg, Oral, BID, Angelia Mould, MD, 650 mg at 01/23/21 1011   Patients Current Diet:  Diet Order                  DIET DYS 2 Room service appropriate? Yes with Assist; Fluid consistency: Thin  Diet effective now             Diet - low sodium heart healthy                         Precautions / Restrictions Precautions Precautions: Fall, Other (comment) Precaution Comments: bilateral BKA Other Brace: Bilateral prostheses Restrictions Weight Bearing Restrictions: No    Has the patient had 2 or more falls or a fall with injury in the past year? No   Prior Activity Level Community (5-7x/wk): Pt went out nearly daily   Prior Functional Level Self Care: Did the patient need help bathing, dressing, using the toilet or eating? Independent   Indoor Mobility: Did the patient need assistance with walking from room to room (with or without device)? Independent   Stairs: Did the patient need assistance with internal or external stairs (with or without device)? Independent   Functional Cognition: Did the patient need help planning regular tasks such as shopping or remembering to take medications? Independent   Patient Information Are you of Hispanic, Latino/a,or Spanish origin?: A. No, not of Hispanic, Latino/a, or Spanish  origin What is your race?: B. Black or African American Do you need or want an interpreter to communicate with a doctor or health care staff?: 0. No   Patient's Response To:  Health Literacy and Transportation Is the patient able to respond to health literacy and transportation needs?: Yes Health Literacy - How often do you need to have someone help you when you read instructions, pamphlets, or other written material from your doctor or pharmacy?: Never In the past 12 months, has lack of transportation kept you from medical appointments or from getting medications?: No In the past 12 months, has lack of transportation kept you from meetings, work, or from getting things needed for daily living?: No   Development worker, international aid / [place] Devices/Equipment: Prosthesis, Shower chair with back, Radio producer (specify quad or straight), Environmental consultant (specify type), Wheelchair Home Equipment: Conservation officer, nature (2 wheels), [employer] - single point, Wheelchair - manual   Prior Device Use: Indicate devices/aids used by the patient prior to current illness, exacerbation or injury? Manual wheelchair and Motorized wheelchair or scooter   Current Functional Level Cognition   Overall Cognitive Status: Impaired/Different from baseline Orientation Level: Oriented X4 Safety/Judgement: Decreased awareness of safety, Decreased awareness of deficits General Comments: patient oriented x4 and able to follow directions    Extremity Assessment (includes Sensation/Coordination)   Upper Extremity Assessment: RUE deficits/detail, LUE deficits/detail RUE Coordination: decreased fine motor LUE Coordination: decreased fine motor  Lower Extremity Assessment: Defer to PT evaluation RLE Deficits / Details: BKA LLE Deficits / Details: BKA     ADLs   Overall ADL's : Needs assistance/impaired Eating/Feeding: Minimal assistance, Sitting Eating/Feeding Details (indicate cue type and reason): assist to open containers Grooming:  Wash/dry hands, Sitting, Set up Grooming Details (indicate cue type and reason): performed in recliner Upper Body Bathing: Set up, Sitting Lower Body Bathing: Sitting/lateral leans, Maximal assistance Upper Body Dressing : Set up, Sitting Lower Body Dressing: Sitting/lateral leans, Maximal assistance Toilet Transfer: +2 for physical  assistance, Minimal assistance (lateral transfer) Toileting- Clothing Manipulation and Hygiene: Moderate assistance, Sitting/lateral lean General ADL Comments: only agreeable to grooming this am     Mobility   Overal bed mobility: Needs Assistance Bed Mobility: Supine to Sit Rolling: Min assist Supine to sit: Mod assist, HOB elevated Sit to supine: Min guard, HOB elevated General bed mobility comments: pt requiring assistance to pivot hips with use of bed pad     Transfers   Overall transfer level: Needs assistance Equipment used:  (bed pad) Transfers: Bed to chair/wheelchair/BSC Bed to/from chair/wheelchair/BSC transfer type:: Lateral/scoot transfer Anterior-Posterior transfers: Max assist, +2 physical assistance  Lateral/Scoot Transfers: Max assist, From elevated surface General transfer comment: pt requiring maxA to laterally scoot from bed to drop arm recliner. bed elevated to allow for downhill scoot. pt requiring cues to maintain head forward, no clearance of buttocks noted with scoot attempts.     Ambulation / Gait / Stairs / Wheelchair Mobility   Ambulation/Gait General Gait Details: Unable due to LE's too swollen for prosthetics to fit     Posture / Balance Dynamic Sitting Balance Sitting balance - Comments: posterior lean Balance Overall balance assessment: Needs assistance Sitting-balance support: Single extremity supported, Bilateral upper extremity supported, Feet supported Sitting balance-Leahy Scale: Poor Sitting balance - Comments: posterior lean Postural control: Posterior lean Standing balance support: Bilateral upper extremity  supported Standing balance-Leahy Scale: Poor Standing balance comment: stood from relcliner with max assist to power up and mod assist for balance     Special needs/care consideration Skin surgical incision, Diabetic management Novolog 0-15 units sQ 3x daility with meals, and Special service needs BLE prostheses , HD TTS    Previous Home Environment (from acute therapy documentation) Living Arrangements: Other (Comment) (ex wife) Available Help at Discharge: Family Type of Home: House Home Layout: One level Home Access: Stairs to enter Entrance Stairs-Rails: Right Entrance Stairs-Number of Steps: 4 Bathroom Shower/Tub: Other (comment) (sponges bathes at sink) Biochemist, clinical: Standard Home Care Services: No Additional Comments: reports his w/c arm is broken, gave his [facility] away   Discharge Living Setting Plans for Discharge Living Setting: Alone Type of Home at Discharge: House Discharge Home Layout: One level Discharge Home Access: Stairs to enter Entrance Stairs-Rails: Right Entrance Stairs-Number of Steps: 4 Discharge Bathroom Shower/Tub: Tub/shower unit Discharge Bathroom Toilet: Standard Discharge Bathroom Accessibility: Yes How Accessible: Accessible via walker Does the patient have any problems obtaining your medications?: No   Social/Family/Support Systems   Goals Patient/Family Goal for Rehab: PT/OT Supervision Expected length of stay: 12-14 days Pt/Family Agrees to Admission and willing to participate: Yes Program Orientation Provided & Reviewed with Pt/Caregiver Including Roles  & Responsibilities: Yes   Decrease burden of Care through IP rehab admission: Specialzed equipment needs, Decrease number of caregivers, Bowel and bladder program, and Patient/family education   Possible need for SNF placement upon discharge: not anticipated    Patient Condition: I have reviewed medical records from [facility], spoken with CM, and patient. I met with  patient at the bedside for inpatient rehabilitation assessment.  Patient will benefit from ongoing PT and OT, can actively participate in 3 hours of therapy a day 5 days of the week, and can make measurable gains during the admission.  Patient will also benefit from the coordinated team approach during an Inpatient Acute Rehabilitation admission.  The patient will receive intensive therapy as well as Rehabilitation physician, nursing, social worker, and care management interventions.  Due to safety, skin/wound care, disease  management, medication administration, pain management, and patient education the patient requires 24 hour a day rehabilitation nursing.  The patient is currently minA  with mobility and basic ADLs.  Discharge setting and therapy post discharge at home with home health is anticipated.  Patient has agreed to participate in the Acute Inpatient Rehabilitation Program and will admit Sunday 01/25/21.   Preadmission Screen Completed By:  Genella Mech, 01/23/2021 2:18 PM ______________________________________________________________________   Discussed status with Dr. Naaman Plummer on 01/23/21 at 1330 and received approval for admission today.   Admission Coordinator:  Genella Mech, CCC-SLP, time 1330/Date 01/23/21    Assessment/Plan: Diagnosis: Debility Does the need for close, 24 hr/day Medical supervision in concert with the patient's rehab needs make it unreasonable for this patient to be served in a less intensive setting? 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 Yes Does  the patient require coordinated care of a physician, rehab nurse, PT, OT to address physical and functional deficits in the context of the above medical diagnosis(es)? Yes Addressing deficits in the following areas: balance, endurance, locomotion, strength, transferring, bathing, dressing, toileting, and psychosocial support Can the patient actively participate in an intensive therapy program of at least 3 hrs of therapy 5 days a week? Yes The potential for patient to make measurable gains while on inpatient rehab is excellent Anticipated functional outcomes upon discharge from inpatient rehab: min assist PT, min assist OT, n/a SLP Estimated rehab length of stay to reach the above functional goals is: 10-14 days. Anticipated discharge destination: Home 10. Overall Rehab/Functional Prognosis: good     MD Signature: Delice Lesch, MD, ABPMR

## 2021-01-27 NOTE — Plan of Care (Signed)
  Problem: RH Balance Goal: LTG Patient will maintain dynamic sitting balance (PT) Description: LTG:  Patient will maintain dynamic sitting balance with assistance during mobility activities (PT) Flowsheets (Taken 01/26/2021 1804) LTG: Pt will maintain dynamic sitting balance during mobility activities with:: Supervision/Verbal cueing Goal: LTG Patient will maintain dynamic standing balance (PT) Description: LTG:  Patient will maintain dynamic standing balance with assistance during mobility activities (PT) Flowsheets (Taken 01/26/2021 1804) LTG: Pt will maintain dynamic standing balance during mobility activities with:: Contact Guard/Touching assist   Problem: Sit to Stand Goal: LTG:  Patient will perform sit to stand with assistance level (PT) Description: LTG:  Patient will perform sit to stand with assistance level (PT) Flowsheets (Taken 01/26/2021 1804) LTG: PT will perform sit to stand in preparation for functional mobility with assistance level: Minimal Assistance - Patient > 75%   Problem: RH Car Transfers Goal: LTG Patient will perform car transfers with assist (PT) Description: LTG: Patient will perform car transfers with assistance (PT). Flowsheets (Taken 01/26/2021 1804) LTG: Pt will perform car transfers with assist:: Minimal Assistance - Patient > 75%   Problem: RH Furniture Transfers Goal: LTG Patient will perform furniture transfers w/assist (OT/PT) Description: LTG: Patient will perform furniture transfers  with assistance (OT/PT). Flowsheets (Taken 01/26/2021 1804) LTG: Pt will perform furniture transfers with assist:: Minimal Assistance - Patient > 75%   Problem: RH Ambulation Goal: LTG Patient will ambulate in controlled environment (PT) Description: LTG: Patient will ambulate in a controlled environment, # of feet with assistance (PT). Flowsheets (Taken 01/26/2021 1804) LTG: Pt will ambulate in controlled environ  assist needed:: Contact Guard/Touching  assist LTG: Ambulation distance in controlled environment: 75 ft using LRAD with properly fitting prosthetics Goal: LTG Patient will ambulate in home environment (PT) Description: LTG: Patient will ambulate in home environment, # of feet with assistance (PT). Flowsheets (Taken 01/26/2021 1804) LTG: Pt will ambulate in home environ  assist needed:: Contact Guard/Touching assist LTG: Ambulation distance in home environment: 50 ft using LRAD with properly fitting prosthetics   Problem: RH Wheelchair Mobility Goal: LTG Patient will propel w/c in controlled environment (PT) Description: LTG: Patient will propel wheelchair in controlled environment, # of feet with assist (PT) Flowsheets (Taken 01/26/2021 1804) LTG: Pt will propel w/c in controlled environ  assist needed:: Supervision/Verbal cueing LTG: Propel w/c distance in controlled environment: 100 ft Goal: LTG Patient will propel w/c in home environment (PT) Description: LTG: Patient will propel wheelchair in home environment, # of feet with assistance (PT). Flowsheets (Taken 01/26/2021 1804) LTG: Pt will propel w/c in home environ  assist needed:: Supervision/Verbal cueing Distance: wheelchair distance in controlled environment: 100 LTG: Propel w/c distance in home environment: 50 ft   Problem: RH Stairs Goal: LTG Patient will ambulate up and down stairs w/assist (PT) Description: LTG: Patient will ambulate up and down # of stairs with assistance (PT) Flowsheets (Taken 01/26/2021 1804) LTG: Pt will ambulate up/down stairs assist needed:: Minimal Assistance - Patient > 75% LTG: Pt will  ambulate up and down number of stairs: at least 3 steps with HR setup as per home environment in order to enter/ exit home

## 2021-01-27 NOTE — Progress Notes (Signed)
Physical Therapy Session Note  Patient Details  Name: [name] MRN: [id number] Date of Birth: [birth year]  Today's Date: 01/27/2021 PT Individual Time: 4081-4481 PT Individual Time Calculation (min): 68 min   Short Term Goals: Week 1:  PT Short Term Goal 1 (Week 1): Pt will demonstrate bed mobility with consistent CGA. PT Short Term Goal 2 (Week 1): Pt will don BLE prosthetics with all necessary parts for proper fit with MinA. PT Short Term Goal 3 (Week 1): Pt will perform sit<>stand  and pivot transfers with consistent Min/ ModA PT Short Term Goal 4 (Week 1): Pt will initiate gait training. PT Short Term Goal 5 (Week 1): Pt will initiate w/c mobility and parts management training.  Skilled Therapeutic Interventions/Progress Updates:    Pt sitting up in bed on arrival, initially reluctant to participate but easily persuaded. No complaint of pain, but incr time to initiate all mobility throughout session. With HOB upright, pt came to EOB with min A. Donned prosthetics with max A after several attempts to promote independence, which pt refused. Bed>chair with stedy. Min A to stand in stedy. Pt propelled w/c with BUE x 50 ft for endurance and functional mobility. Pt had difficulty with navigation and efficient propulsion d/t swelling in L arm.  Pt then stood in // bars x 4 with CGA fading to mod A with extended rest breaks d/t fatigue. Pt returned to room and was agreeable to sit up in w/c. Therapist elevated L arm to promote fluid drainage and pt was left with all needs in reach and alarm active.   Therapy Documentation Precautions:  Precautions Precautions: Fall, Other (comment) Precaution Comments: bilateral BKA Required Braces or Orthoses: Other Brace Other Brace: Bilateral prostheses Restrictions Weight Bearing Restrictions: No   Therapy/Group: Individual Therapy  [name] 01/27/2021, 4:08 PM

## 2021-01-27 NOTE — Plan of Care (Signed)
  Problem: RH Problem Solving Goal: LTG Patient will demonstrate problem solving for (SLP) Description: LTG:  Patient will demonstrate problem solving for basic/complex daily situations with cues  (SLP) Flowsheets (Taken 01/27/2021 1147) LTG Patient will demonstrate problem solving for: Supervision   Problem: RH Memory Goal: LTG Patient will use memory compensatory aids to (SLP) Description: LTG:  Patient will use memory compensatory aids to recall biographical/new, daily complex information with cues (SLP) Flowsheets (Taken 01/27/2021 1147) LTG: Patient will use memory compensatory aids to (SLP): Supervision

## 2021-01-28 LAB — RENAL FUNCTION PANEL
Albumin: 2.9 g/dL — ABNORMAL LOW (ref 3.5–5.0)
Anion gap: 13 (ref 5–15)
BUN: 37 mg/dL — ABNORMAL HIGH (ref 8–23)
CO2: 25 mmol/L (ref 22–32)
Calcium: 8.8 mg/dL — ABNORMAL LOW (ref 8.9–10.3)
Chloride: 95 mmol/L — ABNORMAL LOW (ref 98–111)
Creatinine, Ser: 5.8 mg/dL — ABNORMAL HIGH (ref 0.61–1.24)
GFR, Estimated: 10 mL/min — ABNORMAL LOW (ref >60)
Glucose, Bld: 158 mg/dL — ABNORMAL HIGH (ref 70–99)
Phosphorus: 4.2 mg/dL (ref 2.5–4.6)
Potassium: 4.2 mmol/L (ref 3.5–5.1)
Sodium: 133 mmol/L — ABNORMAL LOW (ref 135–145)

## 2021-01-28 LAB — GLUCOSE, CAPILLARY
Glucose-Capillary: 231 mg/dL — ABNORMAL HIGH (ref 70–99)
Glucose-Capillary: 109 mg/dL — ABNORMAL HIGH (ref 70–99)
Glucose-Capillary: 151 mg/dL — ABNORMAL HIGH (ref 70–99)
Glucose-Capillary: 125 mg/dL — ABNORMAL HIGH (ref 70–99)
Glucose-Capillary: 161 mg/dL — ABNORMAL HIGH (ref 70–99)
Glucose-Capillary: 110 mg/dL — ABNORMAL HIGH (ref 70–99)
Glucose-Capillary: 110 mg/dL — ABNORMAL HIGH (ref 70–99)

## 2021-01-28 LAB — CBC
HCT: 26.5 % — ABNORMAL LOW (ref 39.0–52.0)
Hemoglobin: 8.4 g/dL — ABNORMAL LOW (ref 13.0–17.0)
MCH: 30.1 pg (ref 26.0–34.0)
MCHC: 31.7 g/dL (ref 30.0–36.0)
MCV: 95 fL (ref 80.0–100.0)
Platelets: 238 10*3/uL (ref 150–400)
RBC: 2.79 MIL/uL — ABNORMAL LOW (ref 4.22–5.81)
RDW: 19.8 % — ABNORMAL HIGH (ref 11.5–15.5)
WBC: 9.4 10*3/uL (ref 4.0–10.5)
nRBC: 0.3 % — ABNORMAL HIGH (ref 0.0–0.2)

## 2021-01-28 MED ORDER — SORBITOL 70 % SOLN
30.0000 mL | Freq: Once | Status: AC
  Administered 2021-01-28: 30 mL via ORAL
  Filled 2021-01-28: qty 30

## 2021-01-28 MED ORDER — HEPARIN SODIUM (PORCINE) 1000 UNIT/ML DIALYSIS: 40.0000 [IU]/kg | INTRAMUSCULAR | Status: DC | PRN

## 2021-01-28 MED ORDER — DARBEPOETIN ALFA 100 MCG/0.5ML IJ SOSY
100.0000 ug | PREFILLED_SYRINGE | INTRAMUSCULAR | Status: DC
  Administered 2021-02-05: 100 ug via INTRAVENOUS
  Filled 2021-01-28 (×4): qty 0.5

## 2021-01-28 NOTE — Progress Notes (Signed)
PROGRESS NOTE   Subjective/Complaints:   Swelling looks a little better- but has K Tape on LUE form fingers to elbow-.  LBM 4 days ago- is "normal" for him- doesn't see why needs to take meds to have BM- explained should have BM at least 2x/week.  Is passing gas per pt.  No SOB- O2 is new 2L.    ROS:  Pt denies SOB, abd pain, CP, N/V/C/D, and vision changes    Objective:   No results found. Recent Labs    01/25/21 1535 01/26/21 1500  WBC 11.8* 9.0  HGB 7.8* 7.5*  HCT 24.9* 23.6*  PLT 195 209   Recent Labs    01/25/21 1535  NA 131*  K 3.7  CL 96*  CO2 24  GLUCOSE 150*  BUN 38*  CREATININE 5.18*  CALCIUM 8.1*    Intake/Output Summary (Last 24 hours) at 01/28/2021 3335 Last data filed at 01/28/2021 0740 Gross per 24 hour  Intake 478 ml  Output --  Net 478 ml        Physical Exam: Vital Signs Blood pressure (!) 122/59, pulse 69, temperature 99.5 F (37.5 C), temperature source Oral, resp. rate 14, height 5\' 8"  (1.727 m), weight 104.8 kg, SpO2 98 %.     Tm- 99.5- in last 24 hours.  General: awake, alert, appropriate, sitting up in bed; has O2 by [place] 2L; NAD HENT: conjugate gaze; oropharynx moist CV: regular rate; no JVD Pulmonary: CTA B/L; no W/R/R- good air movement GI: soft, NT, ND, (+)BS Psychiatric: irritable and not interactive Neurological: Ox3  Skin- LUE has K tape on it- from fingers to L elbow- ~ 50% better swelling; no erythema; no heat Musculoskeletal:     Cervical back: Normal range of motion and neck supple.     Comments: B/l BKA with mild tenderness, mild edema  Skin:    General: Skin is warm and dry.  Neurological:     Mental Status: He is alert.     Comments: Alert Dysarthria Motor: Grossly 4/5, left weaker than right   Assessment/Plan: 1. Functional deficits which require 3+ hours per day of interdisciplinary therapy in a comprehensive inpatient rehab  setting. Physiatrist is providing close team supervision and 24 hour management of active medical problems listed below. Physiatrist and rehab team continue to assess barriers to discharge/monitor patient progress toward functional and medical goals  Care Tool:  Bathing    Body parts bathed by patient: Right arm, Left arm, Chest, Abdomen, Front perineal area, Right upper leg, Left upper leg, Face   Body parts bathed by helper: Buttocks Body parts n/a: Right lower leg, Left lower leg   Bathing assist Assist Level: Minimal Assistance - Patient > 75%     Upper Body Dressing/Undressing Upper body dressing   What is the patient wearing?: Button up shirt    Upper body assist Assist Level: Moderate Assistance - Patient 50 - 74%    Lower Body Dressing/Undressing Lower body dressing    Lower body dressing activity did not occur: Refused What is the patient wearing?: Pants, Underwear/pull up     Lower body assist Assist for lower body dressing: Total Assistance - Patient <  25%     Toileting Toileting Toileting Activity did not occur Landscape architect and hygiene only): N/A (no void or bm)  Toileting assist       Transfers Chair/bed transfer  Transfers assist     Chair/bed transfer assist level: Dependent - Patient 0% (stedy)     Locomotion Ambulation   Ambulation assist   Ambulation activity did not occur: Safety/medical concerns          Walk 10 feet activity   Assist  Walk 10 feet activity did not occur: Safety/medical concerns        Walk 50 feet activity   Assist Walk 50 feet with 2 turns activity did not occur: Safety/medical concerns         Walk 150 feet activity   Assist Walk 150 feet activity did not occur: Safety/medical concerns         Walk 10 feet on uneven surface  activity   Assist Walk 10 feet on uneven surfaces activity did not occur: Safety/medical concerns         Wheelchair     Assist Is the patient  using a wheelchair?: Yes Type of Wheelchair: Manual    Wheelchair assist level: Total Assistance - Patient < 25% Max wheelchair distance: 30 ft    Wheelchair 50 feet with 2 turns activity    Assist    Wheelchair 50 feet with 2 turns activity did not occur: Safety/medical concerns       Wheelchair 150 feet activity     Assist  Wheelchair 150 feet activity did not occur: Safety/medical concerns       Blood pressure (!) 122/59, pulse 69, temperature 99.5 F (37.5 C), temperature source Oral, resp. rate 14, height 5\' 8"  (1.727 m), weight 104.8 kg, SpO2 98 %.  Medical Problem List and Plan: 1.  Deficits with mobility, self-care, transfers, endurance secondary to L pontine stroke              -patient may not shower due to HD catheter             -ELOS/Goals: 10-14 days/Min A            Continue CIR- PT, OT and SLP  Set d/c- date as 12/7- cannot go to SNF due to insurance, since came here- biggest limitation is pt's cognition/unsafe behavior- refusing Slp regularly- con't PT/OT/SLP 2.  Antithrombotics: -DVT/anticoagulation:  Mechanical: Sequential compression devices, below knee Bilateral lower extremities             -antiplatelet therapy: DAPT X 3 weeks followed by ASA alone.  3. Pain Management: Tylenol prn.  4. Mood: LCSW to follow for evaluation and support.              -antipsychotic agents: N/A 5. Neuropsych: This patient is ?fully capable of making decisions on his own behalf. 6. Skin/Wound Care: Routine pressure relief measures.  7. Fluids/Electrolytes/Nutrition: Strict I/O. 1200 cc FR. Renal diet' 8. 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 Demand ischemia/NSVT: On amiodarone daily  10. Thrombocytopenia: Platelets recovering from drop to 76-->108  11/21- Plts 208 today- con't to monitor 11. Anemia of chronic disease: ON Aranesp weekly for supplement.              CBC ordered for tomorrow  11/21- Hb a little decreased at 7.5- will recheck per renal  12. T2DM with hyperglycemia: Hgb  A1C-6.8.  Monitor BS ac/hs.  --Continue SSI as well as 4 units novolog TID with meals 11/22- BG 102- 173- slightly variable, but had snacks on table- con't regimen 11/23- BG's 120s-150s except 1 value of 231- con't regimen Monitor with increased mobility 13. Small acute left pontine infarct             See #2 14. S/p b/l BKA- chronic             Continue to monitor 15. Impaired cognition- mild to moderate with impulsiveness and poor safety awareness- will con't SLP and therapies, however this is pt's biggest limitation.     LOS: 3 days A FACE TO FACE EVALUATION WAS PERFORMED  Shelley Cocke 01/28/2021, 9:22 AM

## 2021-01-28 NOTE — IPOC Note (Signed)
Overall Plan of Care [facility]) Patient Details Name: [name] MRN: [id number] DOB: [birth year]  Admitting Diagnosis: Left pontine stroke [facility])  Hospital Problems: Principal Problem:   Left pontine stroke [facility]) Active Problems:   Debility     Functional Problem List: Nursing Safety, Medication Management, Nutrition, Endurance, Bowel  PT Balance, Behavior, Edema, Endurance, Motor, Nutrition, Pain, Safety, Perception, Sensory, Skin Integrity  OT Balance, Edema, Endurance, Motor, Pain, Safety  SLP Cognition, Nutrition  TR         Basic ADL's: OT Grooming, Bathing, Dressing, Toileting     Advanced  ADL's: OT Simple Meal Preparation     Transfers: PT Bed Mobility, Bed to Chair, Car, Manufacturing systems engineer, Metallurgist: PT Ambulation, Emergency planning/management officer, Stairs     Additional Impairments: OT Fuctional Use of Upper Extremity  SLP Swallowing, Social Cognition   Awareness, Problem Solving  TR      Anticipated Outcomes Item Anticipated Outcome  Self Feeding No goal  Swallowing      Basic self-care  Min A  Toileting  Min A   Bathroom Transfers Min A  Bowel/Bladder  Manage bowel w mod I assist  Transfers  CGA/ MinA  Locomotion  CGA/ MinA  Communication     Cognition  sup A  Pain  n/a  Safety/Judgment  maintain safety w cues   Therapy Plan: PT Intensity: Minimum of 1-2 x/day ,45 to 90 minutes PT Frequency: 5 out of 7 days PT Duration Estimated Length of Stay: 10-14 days OT Intensity: Minimum of 1-2 x/day, 45 to 90 minutes OT Frequency: 5 out of 7 days OT Duration/Estimated Length of Stay: 12-14 days SLP Intensity: Minumum of 1-2 x/day, 30 to 90 minutes SLP Frequency: 3 to 5 out of 7 days SLP Duration/Estimated Length of Stay: ~12-14 days   Due to the current state of emergency, patients may not be receiving their 3-hours of Medicare-mandated therapy.   Team Interventions: Nursing Interventions Disease Management/Prevention, Medication  Management, Discharge Planning, Bowel Management, Patient/Family Education, Dysphagia/Aspiration Precaution Training  PT interventions Ambulation/gait training, DME/adaptive equipment instruction, Community reintegration, Neuromuscular re-education, Psychosocial support, Stair training, UE/LE Strength taining/ROM, Wheelchair propulsion/positioning, UE/LE Coordination activities, Therapeutic Activities, Skin care/wound management, Pain management, Functional electrical stimulation, Training and development officer, Discharge planning, Cognitive remediation/compensation, Functional mobility training, Patient/family education, Disease management/prevention, Splinting/orthotics, Therapeutic Exercise, Visual/perceptual remediation/compensation  OT Interventions Balance/vestibular training, Disease mangement/prevention, Neuromuscular re-education, Self Care/advanced ADL retraining, Therapeutic Exercise, Wheelchair propulsion/positioning, UE/LE Strength taining/ROM, Skin care/wound managment, Pain management, DME/adaptive equipment instruction, Cognitive remediation/compensation, Community reintegration, Barrister's clerk education, UE/LE Coordination activities, Discharge planning, Functional mobility training, Psychosocial support, Therapeutic Activities  SLP Interventions Cognitive remediation/compensation, Cueing hierarchy, Functional tasks, Patient/family education, Therapeutic Activities  TR Interventions    SW/CM Interventions Discharge Planning, Psychosocial Support, Patient/Family Education   Barriers to Discharge MD  Medical stability, Home enviroment access/loayout, Wound care, Lack of/limited family support, Weight, Weight bearing restrictions, Medication compliance, and Behavior  Nursing Lack of/limited family support, Home environment access/layout, Hemodialysis 1 level 4ste solo; w/c broken. Son and family can assist  PT Inaccessible home environment, Decreased caregiver support, Home environment  access/layout, Wound Care, Lack of/limited family support, Insurance for SNF coverage, Weight, Hemodialysis, Medication compliance, Behavior, Nutrition means    OT Hemodialysis, New oxygen    SLP      SW Lack of/limited family support, Insurance for SNF coverage, Decreased caregiver support, New oxygen, Other (comments) new HD   Team Discharge Planning: Destination: PT-Home ,OT- Home , SLP-Home Projected  Follow-up: PT-Home health PT, Outpatient PT, OT-  Home health OT, SLP-Other (comment) (TBD) Projected Equipment Needs: PT-To be determined, OT- To be determined, SLP-None recommended by SLP Equipment Details: PT-pt states he has a w/c but unsure of size and condition; also states he has a RW, has hurrycane in room, OT-  Patient/family involved in discharge planning: PT- Patient,  OT-Patient, SLP-Patient  MD ELOS: 10-14 days Medical Rehab Prognosis:  Fair Assessment: Pt is a 70 yr old male with new ESRD and L pontine stroke and B/L BKA's and variable Bgs/DM- cognition and impulsiveness and poor safety awareness are pt's biggest limitations-  Goals Min A    See Team Conference Notes for weekly updates to the plan of care

## 2021-01-28 NOTE — Progress Notes (Signed)
Patient ID: [name], male   DOB: Oct 12, 1950, 70 y.o.   MRN: [id number]  [facility] Progress Note   Assessment/ Plan:   1.  Deconditioning/mobility deficits: Following hospitalization and small acute left pontine infarct.  Continues to report making improvements with PT/OT and tentative discharge date set for 12/7. 2.  End-stage renal disease: From underlying progressive chronic kidney disease and exacerbated by cardiorenal syndrome now started on hemodialysis on a TTS schedule.  He is status post left upper arm AV graft placement on 11/14 and is getting dialysis via right IJ TDC; his graft should be suitable to cannulate after 12/12.  Arm swelling noted ipsilateral to left upper arm graft-improving with elevation and will challenge EDW.  He has been accepted to [facility] upon discharge.  3. Anemia: Without overt blood loss, continue ESA and monitoring hemoglobin/hematocrit trend; previously scheduled for Aranesp on Thursday which I will change to today because HD is off schedule.  PRBC transfusion if hemoglobin <7.0. 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 5. Nutrition: Continue renal diet with ongoing renal multivitamin supplementation.  Continue nutritional supplementation as indicated by albumin levels. 6. Hypertension: Blood pressure acceptable, continue to monitor with dialysis.  Subjective:   Reports some improvement of left arm swelling overnight, denies any chest pain or shortness of breath   Objective:   BP (!) 122/59   Pulse 69   Temp 99.5 F (37.5 C) (Oral)   Resp 14   Ht 5\' 8"  (1.727 m)   Wt 104.8 kg   SpO2 98%   BMI 35.13 kg/m   Physical Exam: Gen: Comfortably resting in bed, just finished breakfast CVS: Pulse regular rhythm, normal rate, S1 and S2 normal.  Right IJ TDC in place Resp: Clear to auscultation bilaterally, no rales/rhonchi Abd: Soft, obese, nontender, bowel sounds  normal Ext: Status post bilateral below-knee amputations with socks over stumps.  Improving edema left upper extremity with audible bruit over graft.  Labs: BMET Recent Labs  Lab 01/22/21 0348 01/22/21 1854 01/22/21 1902 01/23/21 0054 01/24/21 0151 01/25/21 0149 01/25/21 1535  NA 132* 132* 134* 134* 130* 133* 131*  K 4.2 3.8 3.5 4.1 4.4 3.8 3.7  CL 97* 95*  --  95* 94* 96* 96*  CO2 23 23  --  23 24 26 24   GLUCOSE 163* 153*  --  87 232* 168* 150*  BUN 64* 34*  --  39* 53* 31* 38*  CREATININE 7.08* 4.56*  --  4.96* 6.39* 4.56* 5.18*  CALCIUM 8.1* 8.1*  --  8.3* 8.1* 8.3* 8.1*  PHOS  --   --   --  3.9  --   --  4.3   CBC Recent Labs  Lab 01/22/21 1910 01/23/21 0054 01/24/21 0151 01/25/21 0149 01/25/21 1535 01/26/21 1500  WBC 10.6* 11.5* 12.5* 12.5* 11.8* 9.0  NEUTROABS 6.5 9.1*  --   --   --   --   HGB 8.6* 8.7* 8.0* 7.6* 7.8* 7.5*  HCT 26.0* 27.1* 25.4* 24.0* 24.9* 23.6*  MCV 91.5 90.9 91.7 92.7 94.0 94.0  PLT 107* 108* 155 165 195 209      Medications:     (feeding supplement) PROSource Plus  30 mL Oral BID BM   amiodarone  200 mg Oral Daily   aspirin EC  81 mg Oral q morning   atorvastatin  80 mg Oral QHS   Chlorhexidine Gluconate Cloth  6 each Topical  Q0600   clopidogrel  75 mg Oral Daily   [START ON 01/29/2021] darbepoetin (ARANESP) injection - DIALYSIS  60 mcg Intravenous Q Thu-HD   feeding supplement (NEPRO CARB STEADY)  237 mL Oral BID BM   gabapentin  300 mg Oral QHS   insulin aspart  0-15 Units Subcutaneous TID WC   insulin aspart  4 Units Subcutaneous TID WC   mouth rinse  15 mL Mouth Rinse BID   multivitamin  1 tablet Oral QHS   pantoprazole  40 mg Oral Q0600   Elmarie Shiley, MD 01/28/2021, 7:42 AM

## 2021-01-28 NOTE — Progress Notes (Signed)
Physical Therapy Session Note  Patient Details  Name: [name] MRN: [id number] Date of Birth: Nov 26, 1950  Today's Date: 01/28/2021 PT Individual Time: 0930-1050 PT Individual Time Calculation (min): 80 min   Short Term Goals: Week 1:  PT Short Term Goal 1 (Week 1): Pt will demonstrate bed mobility with consistent CGA. PT Short Term Goal 2 (Week 1): Pt will don BLE prosthetics with all necessary parts for proper fit with MinA. PT Short Term Goal 3 (Week 1): Pt will perform sit<>stand  and pivot transfers with consistent Min/ ModA PT Short Term Goal 4 (Week 1): Pt will initiate gait training. PT Short Term Goal 5 (Week 1): Pt will initiate w/c mobility and parts management training.  Skilled Therapeutic Interventions/Progress Updates: Pt presented in w/c agreeable to therapy. Pt denies pain at start of session with LUE elevated on 2 pillows. PTA noted some tape peeling off hand and cut tape to where adhesive sticking. Pt then transported to rehab gym and performed Sit to stand in parallel bars. Pt was able to ambulate forward backwards x 6 ft x 2 with heavy use of BUE but CGA. Pt required increased time between bouts due to fatigue. Pt then agreeable to attempt Sit to stand with RW from mat. PTA obtained Stedy and performed Stedy transfer from w/c to mat with minA. After extended seated rest pt was able to perform Sit to stand from elevated mat with w/c with modA however was unable to come to full upright posture. After extended rest including pt laying on mat requiring modA to return to sitting pt transferred back to w/c via Stedy. Pt was able to propel ~27ft intermittently pulling self with wall rail then PTA transported pt remaining distance back to room. Stedy transfer then performed to EOB and pt was able to doff B prosthetics with minA from PTA and donned socks total A per pt request. Pt performed sit to supine at EOB with mod A from PTA and pt was set up with pillows under BLE and LUE. Pt  left in bed at end of session with bed alarm on, call bell within reach and visitor present.       Therapy Documentation Precautions:  Precautions Precautions: Fall, Other (comment) Precaution Comments: bilateral BKA Required Braces or Orthoses: Other Brace Other Brace: Bilateral prostheses Restrictions Weight Bearing Restrictions: No General: PT Amount of Missed Time (min): 10 Minutes PT Missed Treatment Reason: Patient fatigue Vital Signs:  Pain:   Mobility:   Locomotion :    Trunk/Postural Assessment :    Balance:   Exercises:   Other Treatments:      Therapy/Group: Individual Therapy  Sacora Hawbaker 01/28/2021, 12:34 PM

## 2021-01-28 NOTE — Progress Notes (Addendum)
Occupational Therapy Session Note  Patient Details  Name: [name] MRN: [id number] Date of Birth: 1950/10/11  Today's Date: 01/28/2021 OT Individual Time: 800-913 OT Individual Time Calculation (min): 73  min    Short Term Goals: Week 1:  OT Short Term Goal 1 (Week 1): PT will complete BSC or toilet transfer with 1 assist and LRAD OT Short Term Goal 2 (Week 1): Pt will don B prosthetics with supervision assistance OT Short Term Goal 3 (Week 1): Pt will sit EOB ~20 minutes during self care activity before returning to bed to rest in order to increase functional activity tolerance  Skilled Therapeutic Interventions/Progress Updates:  Pt greeted supine in bed agreeable to OT intervention. Session focus on BADL reeducation, activity tolerance and functional transfers. Pt declined bathing from EOB but agreeable to bed level bathing. Pt opted to only wash UB needing MOD A, pt able to wash chest and ABD, and under both arms but needed to roll for this OTA to wash his back. Pt able to use BUEs on bed rails to elevate trunk into long sitting to doff button up shirt, pt needed 2 attempts to fully doff shirt as pt presents with impaired balance and BUE strength and endurance needing to lay back and rest. Pt agreeable to transfer EOB to don OH shirt, MOD A to transition supine>sit to R side of bed, most assist needed to elevate trunk and scoot hips to EOB. MODA to don OH shirt d/t pts preference to follow his own directions. Pt needed total A to don prosthetics despite encouragement to don independently. Pt sit<>stand in stedy with MOD A, total A transfer to w/c in stedy. MIN A to stand from seat of stedy. Pt completed seated grooming tasks in w/c at sink with set- up. Pt left seated in w/c with LUE elevated, chair alarm activated and all needs within reach.                     Of note pt was on 2L upon arrival however SpO2 >90%during session therefore left pt on RA, RN aware Therapy  Documentation Precautions:  Precautions Precautions: Fall, Other (comment) Precaution Comments: bilateral BKA Required Braces or Orthoses: Other Brace Other Brace: Bilateral prostheses Restrictions Weight Bearing Restrictions: No  Pain:pt reports pain in LUE, elevated LUE and provided repositioning and rest breaks as needed.    Therapy/Group: Individual Therapy  Corinne Ports [facility] 01/28/2021, 12:08 PM

## 2021-01-28 NOTE — Progress Notes (Addendum)
Occupational Therapy Session Note  Patient Details  Name: [name] MRN: [id number] Date of Birth: 1951/02/10  Today's Date: 01/28/2021 OT Individual Time: 1130-1152 OT Individual Time Calculation (min): 22 min    Short Term Goals: Week 1:  OT Short Term Goal 1 (Week 1): PT will complete BSC or toilet transfer with 1 assist and LRAD OT Short Term Goal 2 (Week 1): Pt will don B prosthetics with supervision assistance OT Short Term Goal 3 (Week 1): Pt will sit EOB ~20 minutes during self care activity before returning to bed to rest in order to increase functional activity tolerance  Skilled Therapeutic Interventions/Progress Updates:  Patient met lying supine in bed. Declined EOB/OOB activity secondary to nausea. RN notified. IV Zofran given during treatment session. Patient only in agreement with bed level therapeutic exercise. BUE HEP with 5lb weighted dowel as detailed below. Patient refusing to wear supplemental O2 without providing reason. Session concluded with patient lying supine in bed with call bell within reach, bed alarm activated and all needs met.   BUE HEP (2 sets x10 reps each) Chest press Shoulder press Straight arm raises   Therapy Documentation Precautions:  Precautions Precautions: Fall, Other (comment) Precaution Comments: bilateral BKA Required Braces or Orthoses: Other Brace Other Brace: Bilateral prostheses Restrictions Weight Bearing Restrictions: No General: General OT Amount of Missed Time: 8 Minutes  Therapy/Group: Individual Therapy  Yoni Lobos R [place] 01/28/2021, 7:26 AM

## 2021-01-29 LAB — GLUCOSE, CAPILLARY
Glucose-Capillary: 129 mg/dL — ABNORMAL HIGH (ref 70–99)
Glucose-Capillary: 134 mg/dL — ABNORMAL HIGH (ref 70–99)
Glucose-Capillary: 190 mg/dL — ABNORMAL HIGH (ref 70–99)
Glucose-Capillary: 158 mg/dL — ABNORMAL HIGH (ref 70–99)

## 2021-01-29 MED ORDER — SORBITOL 70 % SOLN
60.0000 mL | Freq: Once | Status: AC
  Administered 2021-01-29: 60 mL via ORAL
  Filled 2021-01-29: qty 60

## 2021-01-29 MED ORDER — SORBITOL 70 % SOLN
30.0000 mL | Freq: Once | Status: AC
  Administered 2021-01-29: 30 mL via ORAL
  Filled 2021-01-29: qty 30

## 2021-01-29 NOTE — Progress Notes (Signed)
Patient ID: [name], male   DOB: February 21, 1951, 70 y.o.   MRN: [id number]  [facility] Progress Note   Assessment/ Plan:   1.  Deconditioning/mobility deficits: Following hospitalization and small acute left pontine infarct.  Continues to report making improvements with PT/OT and tentative discharge date set for 12/7. 2.  End-stage renal disease: From underlying progressive chronic kidney disease and exacerbated by cardiorenal syndrome now started on hemodialysis on a TTS schedule-underwent dialysis yesterday due to holiday schedule.  He is status post left upper arm AV graft placement on 11/14 and is getting dialysis via right IJ TDC; his graft should be suitable to cannulate after 12/12.  Arm swelling noted ipsilateral to left upper arm graft-improving with elevation and will challenge EDW.  He has been accepted to [facility] upon discharge.  3. Anemia: Without overt blood loss. He had Aranesp given yesterday with labs showing uptrending hemoglobin and hematocrit. 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 5. Nutrition: Continue renal diet with ongoing renal multivitamin supplementation.  Continue nutritional supplementation as indicated by albumin levels. 6. Hypertension: Blood pressure acceptable, continue to monitor with dialysis.  Subjective:   Denies any complaints at this time and continues to report improvement of left arm swelling.  Complains of constipation-awaiting enema.   Objective:   BP (!) 111/54 (BP Location: Right Arm)   Pulse 73   Temp 99 F (37.2 C)   Resp 16   Ht 5\' 8"  (1.727 m)   Wt 100.6 kg   SpO2 100%   BMI 33.72 kg/m   Physical Exam: Gen: Comfortably resting in bed. CVS: Pulse regular rhythm, normal rate, S1 and S2 normal.  Right IJ TDC in place Resp: Clear to auscultation bilaterally, no rales/rhonchi Abd: Soft, obese, nontender, bowel sounds normal Ext: Status post  bilateral below-knee amputations with socks over stumps.  Improving edema left upper extremity with audible bruit over graft.  Labs: BMET Recent Labs  Lab 01/22/21 1854 01/22/21 1902 01/23/21 0054 01/24/21 0151 01/25/21 0149 01/25/21 1535 01/28/21 1207  NA 132* 134* 134* 130* 133* 131* 133*  K 3.8 3.5 4.1 4.4 3.8 3.7 4.2  CL 95*  --  95* 94* 96* 96* 95*  CO2 23  --  23 24 26 24 25   GLUCOSE 153*  --  87 232* 168* 150* 158*  BUN 34*  --  39* 53* 31* 38* 37*  CREATININE 4.56*  --  4.96* 6.39* 4.56* 5.18* 5.80*  CALCIUM 8.1*  --  8.3* 8.1* 8.3* 8.1* 8.8*  PHOS  --   --  3.9  --   --  4.3 4.2   CBC Recent Labs  Lab 01/22/21 1910 01/23/21 0054 01/24/21 0151 01/25/21 0149 01/25/21 1535 01/26/21 1500 01/28/21 1207  WBC 10.6* 11.5*   < > 12.5* 11.8* 9.0 9.4  NEUTROABS 6.5 9.1*  --   --   --   --   --   HGB 8.6* 8.7*   < > 7.6* 7.8* 7.5* 8.4*  HCT 26.0* 27.1*   < > 24.0* 24.9* 23.6* 26.5*  MCV 91.5 90.9   < > 92.7 94.0 94.0 95.0  PLT 107* 108*   < > 165 195 209 238   < > = values in this interval not displayed.      Medications:     (feeding supplement) PROSource Plus  30 mL Oral BID BM   amiodarone  200 mg Oral Daily  aspirin EC  81 mg Oral q morning   atorvastatin  80 mg Oral QHS   Chlorhexidine Gluconate Cloth  6 each Topical Q0600   clopidogrel  75 mg Oral Daily   darbepoetin (ARANESP) injection - DIALYSIS  100 mcg Intravenous Q Thu-HD   feeding supplement (NEPRO CARB STEADY)  237 mL Oral BID BM   gabapentin  300 mg Oral QHS   insulin aspart  0-15 Units Subcutaneous TID WC   insulin aspart  4 Units Subcutaneous TID WC   mouth rinse  15 mL Mouth Rinse BID   multivitamin  1 tablet Oral QHS   pantoprazole  40 mg Oral Q0600   sorbitol  60 mL Oral Once   Elmarie Shiley, MD 01/29/2021, 7:54 AM

## 2021-01-29 NOTE — Progress Notes (Signed)
PROGRESS NOTE   Subjective/Complaints:   Pt reports wants swelling to "go away"- it's much better after HD yesterday.  Feels bloated- explained that's because he needs to have BM.  Got sorbitol after HD, no BM yet.    ROS:  Pt denies SOB, abd pain, CP, N/V/(+)C/D, and vision changes    Objective:   No results found. Recent Labs    01/26/21 1500 01/28/21 1207  WBC 9.0 9.4  HGB 7.5* 8.4*  HCT 23.6* 26.5*  PLT 209 238   Recent Labs    01/28/21 1207  NA 133*  K 4.2  CL 95*  CO2 25  GLUCOSE 158*  BUN 37*  CREATININE 5.80*  CALCIUM 8.8*    Intake/Output Summary (Last 24 hours) at 01/29/2021 0848 Last data filed at 01/29/2021 0748 Gross per 24 hour  Intake 360 ml  Output 4000 ml  Net -3640 ml        Physical Exam: Vital Signs Blood pressure (!) 111/54, pulse 73, temperature 99 F (37.2 C), resp. rate 16, height 5\' 8"  (1.727 m), weight 100.6 kg, SpO2 100 %.      General: awake, alert, appropriate, sitting up in bed; eating breakfast;  NAD HENT: conjugate gaze; oropharynx moist CV: regular rate; no JVD Pulmonary: CTA B/L; no W/R/R- good air movement GI: firmish; NT; distended- moderate; hypoactive BS Psychiatric: appropriate but irritable Neurological: alert,  Skin- LUE has K tape on it- from fingers to L elbow- 70% better after HD- see wooding of skin Musculoskeletal:     Cervical back: Normal range of motion and neck supple.     Comments: B/l BKA with mild tenderness, mild edema  Skin:    General: Skin is warm and dry.  Neurological:     Mental Status: He is alert.     Comments: Alert Dysarthria Motor: Grossly 4/5, left weaker than right   Assessment/Plan: 1. Functional deficits which require 3+ hours per day of interdisciplinary therapy in a comprehensive inpatient rehab setting. Physiatrist is providing close team supervision and 24 hour management of active medical problems listed  below. Physiatrist and rehab team continue to assess barriers to discharge/monitor patient progress toward functional and medical goals  Care Tool:  Bathing    Body parts bathed by patient: Chest, Abdomen, Face   Body parts bathed by helper: Buttocks Body parts n/a: Right lower leg, Left lower leg   Bathing assist Assist Level: Moderate Assistance - Patient 50 - 74%     Upper Body Dressing/Undressing Upper body dressing   What is the patient wearing?: Pull over shirt    Upper body assist Assist Level: Moderate Assistance - Patient 50 - 74% (from EOB)    Lower Body Dressing/Undressing Lower body dressing    Lower body dressing activity did not occur: Refused What is the patient wearing?: Pants, Underwear/pull up     Lower body assist Assist for lower body dressing: Total Assistance - Patient < 25%     Toileting Toileting Toileting Activity did not occur (Clothing management and hygiene only): N/A (no void or bm)  Toileting assist       Transfers Chair/bed transfer  Transfers assist  Chair/bed transfer assist level: Dependent - Patient 0% (stedy)     Locomotion Ambulation   Ambulation assist   Ambulation activity did not occur: Safety/medical concerns          Walk 10 feet activity   Assist  Walk 10 feet activity did not occur: Safety/medical concerns        Walk 50 feet activity   Assist Walk 50 feet with 2 turns activity did not occur: Safety/medical concerns         Walk 150 feet activity   Assist Walk 150 feet activity did not occur: Safety/medical concerns         Walk 10 feet on uneven surface  activity   Assist Walk 10 feet on uneven surfaces activity did not occur: Safety/medical concerns         Wheelchair     Assist Is the patient using a wheelchair?: Yes Type of Wheelchair: Manual    Wheelchair assist level: Total Assistance - Patient < 25% Max wheelchair distance: 30 ft    Wheelchair 50 feet with  2 turns activity    Assist        Assist Level: Moderate Assistance - Patient 50 - 74%   Wheelchair 150 feet activity     Assist      Assist Level: Total Assistance - Patient < 25%   Blood pressure (!) 111/54, pulse 73, temperature 99 F (37.2 C), resp. rate 16, height 5\' 8"  (1.727 m), weight 100.6 kg, SpO2 100 %.  Medical Problem List and Plan: 1.  Deficits with mobility, self-care, transfers, endurance secondary to L pontine stroke              -patient may not shower due to HD catheter             -ELOS/Goals: 10-14 days/Min A            Continue CIR- PT, OT and SLP  Set d/c- date as 12/7- cannot go to SNF due to insurance, since came here- biggest limitation is pt's cognition/unsafe behavior- refusing Slp regularly- con't PT/OT/SLP  11/24- Continue CIR- PT, OT and SLP  2.  Antithrombotics: -DVT/anticoagulation:  Mechanical: Sequential compression devices, below knee Bilateral lower extremities             -antiplatelet therapy: DAPT X 3 weeks followed by ASA alone.  3. Pain Management: Tylenol prn.  4. Mood: LCSW to follow for evaluation and support.              -antipsychotic agents: N/A 5. Neuropsych: This patient is ?fully capable of making decisions on his own behalf. 6. Skin/Wound Care: Routine pressure relief measures.  7. Fluids/Electrolytes/Nutrition: Strict I/O. 1200 cc FR. Renal diet' 8. 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  9. Demand ischemia/NSVT: On amiodarone daily 10. Thrombocytopenia: Platelets  recovering from drop to 76-->108  11/21- Plts 208 today- con't to monitor 11. Anemia of chronic disease: ON Aranesp weekly for supplement.  CBC ordered for tomorrow  11/21- Hb a little decreased at 7.5- will recheck per renal  12. T2DM with hyperglycemia: Hgb  A1C-6.8.  Monitor BS ac/hs.  --Continue SSI as well as 4 units novolog TID with meals 11/22- BG 102- 173- slightly variable, but had snacks on table- con't regimen 11/23- BG's 120s-150s except 1 value of 231- con't regimen  11/24- Bgs look better- con't regimen Monitor with increased mobility 13. Small acute left pontine infarct             See #2 14. S/p b/l BKA- chronic             Continue to monitor 15. Impaired cognition- mild to moderate with impulsiveness and poor safety awareness- will con't SLP and therapies, however this is pt's biggest limitation.  16. Constipation  11/24- will give Sorbitol 60cc at 11am and soap suds enema at 3-4 pm- so has time to work.     LOS: 4 days A FACE TO FACE EVALUATION WAS PERFORMED  [name] 01/29/2021, 8:48 AM

## 2021-01-30 LAB — GLUCOSE, CAPILLARY
Glucose-Capillary: 171 mg/dL — ABNORMAL HIGH (ref 70–99)
Glucose-Capillary: 151 mg/dL — ABNORMAL HIGH (ref 70–99)
Glucose-Capillary: 119 mg/dL — ABNORMAL HIGH (ref 70–99)
Glucose-Capillary: 140 mg/dL — ABNORMAL HIGH (ref 70–99)

## 2021-01-30 NOTE — Progress Notes (Signed)
Patient ID: [name], male   DOB: 06-03-1950, 70 y.o.   MRN: [id number]  [facility] Progress Note   Assessment/ Plan:   1.  Deconditioning/mobility deficits: Following hospitalization and small acute left pontine infarct.  Continues to report making improvements with PT/OT and tentative discharge date set for 12/7. 2.  End-stage renal disease: From underlying progressive chronic kidney disease and exacerbated by cardiorenal syndrome now started on hemodialysis on a TTS schedule (on which he will be at [place] following discharge).  Underwent hemodialysis on Wednesday with next dialysis scheduled for tomorrow as we continue efforts at lowering EDW.  Status post left upper arm AV graft on 01/19/2021 with ipsilateral arm swelling possibly from venous hypertension/postoperative edema that we are attempting to manage with UF.  Graft may be cannulated after 12/2 if edema resolved.  3. Anemia: Without overt blood loss. He had Aranesp given yesterday with labs showing uptrending hemoglobin and hematocrit. 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 5. Nutrition: Continue renal diet with ongoing renal multivitamin supplementation.  Continue nutritional supplementation as indicated by albumin levels. 6. Hypertension: Blood pressure acceptable, continue to monitor with dialysis.  Subjective:   Reports significant improvement of abdominal bloating yesterday following enema.  Denies any acute events overnight.   Objective:   BP 101/73   Pulse 68   Temp 98.6 F (37 C)   Resp 16   Ht 5\' 8"  (1.727 m)   Wt 101.6 kg   SpO2 98%   BMI 34.06 kg/m   Physical Exam: Gen: Comfortably resting in bed. CVS: Pulse regular rhythm, normal rate, S1 and S2 normal.  Right IJ TDC in place Resp: Clear to auscultation bilaterally, no rales/rhonchi Abd: Soft, obese, nontender, bowel sounds normal Ext: Status post bilateral  below-knee amputations with socks over stumps.  Edema noted of left upper extremity with audible bruit over graft.  Labs: BMET Recent Labs  Lab 01/24/21 0151 01/25/21 0149 01/25/21 1535 01/28/21 1207  NA 130* 133* 131* 133*  K 4.4 3.8 3.7 4.2  CL 94* 96* 96* 95*  CO2 24 26 24 25   GLUCOSE 232* 168* 150* 158*  BUN 53* 31* 38* 37*  CREATININE 6.39* 4.56* 5.18* 5.80*  CALCIUM 8.1* 8.3* 8.1* 8.8*  PHOS  --   --  4.3 4.2   CBC Recent Labs  Lab 01/25/21 0149 01/25/21 1535 01/26/21 1500 01/28/21 1207  WBC 12.5* 11.8* 9.0 9.4  HGB 7.6* 7.8* 7.5* 8.4*  HCT 24.0* 24.9* 23.6* 26.5*  MCV 92.7 94.0 94.0 95.0  PLT 165 195 209 238      Medications:     (feeding supplement) PROSource Plus  30 mL Oral BID BM   amiodarone  200 mg Oral Daily   aspirin EC  81 mg Oral q morning   atorvastatin  80 mg Oral QHS   Chlorhexidine Gluconate Cloth  6 each Topical Q0600   clopidogrel  75 mg Oral Daily   darbepoetin (ARANESP) injection - DIALYSIS  100 mcg Intravenous Q Thu-HD   feeding supplement (NEPRO CARB STEADY)  237 mL Oral BID BM   gabapentin  300 mg Oral QHS   insulin aspart  0-15 Units Subcutaneous TID WC   insulin aspart  4 Units Subcutaneous TID WC   mouth rinse  15 mL Mouth Rinse BID   multivitamin  1 tablet Oral QHS   pantoprazole  40 mg Oral Q0600   Elmarie Shiley, MD  01/30/2021, 8:17 AM

## 2021-01-30 NOTE — Progress Notes (Signed)
PROGRESS NOTE   Subjective/Complaints:   Pt reports pooping al ot- so unhappy- explained that he needed to get cleaned out, it had been 4-5 days since last BM.   Said it "kept getting stuck"- is now unstuck  Spoke to pharmacy- to change from DAPT to Plavix only, not ASA only per Dr Phoebe Sharps note.   ROS:  Pt denies SOB, abd pain, CP, N/V/C/D, and vision changes   Objective:   No results found. Recent Labs    01/28/21 1207  WBC 9.4  HGB 8.4*  HCT 26.5*  PLT 238   Recent Labs    01/28/21 1207  NA 133*  K 4.2  CL 95*  CO2 25  GLUCOSE 158*  BUN 37*  CREATININE 5.80*  CALCIUM 8.8*    Intake/Output Summary (Last 24 hours) at 01/30/2021 1059 Last data filed at 01/30/2021 0750 Gross per 24 hour  Intake 600 ml  Output --  Net 600 ml        Physical Exam: Vital Signs Blood pressure 101/73, pulse 68, temperature 98.6 F (37 C), resp. rate 16, height 5\' 8"  (1.727 m), weight 101.6 kg, SpO2 98 %.       General: awake, alert, appropriate, NAD HENT: conjugate gaze; oropharynx moist CV: regular rate; no JVD Pulmonary: CTA B/L; no W/R/R- good air movement- little decreased at bases- on O2 by [place] GI: soft, NT, ND, (+)BS- much less distended Psychiatric: appropriate; irritable;  Neurological: Ox3  Skin- LUE has K tape on it- from fingers to L elbow- 70% better after HD- looks good below elbow- still somewhat swollen above elbow, but is better- no erythema; no heat Musculoskeletal:     Cervical back: Normal range of motion and neck supple.     Comments: B/l BKA with mild tenderness, mild edema  Skin:    General: Skin is warm and dry.  Neurological:     Mental Status: He is alert.     Comments: Alert Dysarthria Motor: Grossly 4/5, left weaker than right   Assessment/Plan: 1. Functional deficits which require 3+ hours per day of interdisciplinary therapy in a comprehensive inpatient rehab  setting. Physiatrist is providing close team supervision and 24 hour management of active medical problems listed below. Physiatrist and rehab team continue to assess barriers to discharge/monitor patient progress toward functional and medical goals  Care Tool:  Bathing    Body parts bathed by patient: Chest, Abdomen, Face   Body parts bathed by helper: Buttocks Body parts n/a: Right lower leg, Left lower leg   Bathing assist Assist Level: Moderate Assistance - Patient 50 - 74%     Upper Body Dressing/Undressing Upper body dressing   What is the patient wearing?: Pull over shirt    Upper body assist Assist Level: Moderate Assistance - Patient 50 - 74% (from EOB)    Lower Body Dressing/Undressing Lower body dressing    Lower body dressing activity did not occur: Refused What is the patient wearing?: Pants, Underwear/pull up     Lower body assist Assist for lower body dressing: Total Assistance - Patient < 25%     Toileting Toileting Toileting Activity did not occur Landscape architect and hygiene only):  N/A (no void or bm)  Toileting assist       Transfers Chair/bed transfer  Transfers assist     Chair/bed transfer assist level: Dependent - Patient 0% (stedy)     Locomotion Ambulation   Ambulation assist   Ambulation activity did not occur: Safety/medical concerns          Walk 10 feet activity   Assist  Walk 10 feet activity did not occur: Safety/medical concerns        Walk 50 feet activity   Assist Walk 50 feet with 2 turns activity did not occur: Safety/medical concerns         Walk 150 feet activity   Assist Walk 150 feet activity did not occur: Safety/medical concerns         Walk 10 feet on uneven surface  activity   Assist Walk 10 feet on uneven surfaces activity did not occur: Safety/medical concerns         Wheelchair     Assist Is the patient using a wheelchair?: Yes Type of Wheelchair: Manual     Wheelchair assist level: Total Assistance - Patient < 25% Max wheelchair distance: 30 ft    Wheelchair 50 feet with 2 turns activity    Assist        Assist Level: Moderate Assistance - Patient 50 - 74%   Wheelchair 150 feet activity     Assist      Assist Level: Total Assistance - Patient < 25%   Blood pressure 101/73, pulse 68, temperature 98.6 F (37 C), resp. rate 16, height 5\' 8"  (1.727 m), weight 101.6 kg, SpO2 98 %.  Medical Problem List and Plan: 1.  Deficits with mobility, self-care, transfers, endurance secondary to L pontine stroke              -patient may not shower due to HD catheter             -ELOS/Goals: 10-14 days/Min A            Continue CIR- PT, OT and SLP  Set d/c- date as 12/7- cannot go to SNF due to insurance, since came here- biggest limitation is pt's cognition/unsafe behavior- refusing Slp regularly- con't PT/OT/SLP  Continue CIR- PT, OT and SLP  2.  Antithrombotics: -DVT/anticoagulation:  Mechanical: Sequential compression devices, below knee Bilateral lower extremities             -antiplatelet therapy: DAPT X 3 weeks followed by Plavix alone  11/25- changing to Plavix alone as of tomorrow 3. Pain Management: Tylenol prn.  4. Mood: LCSW to follow for evaluation and support.              -antipsychotic agents: N/A 5. Neuropsych: This patient is ?fully capable of making decisions on his own behalf. 6. Skin/Wound Care: Routine pressure relief measures.  7. Fluids/Electrolytes/Nutrition: Strict I/O. 1200 cc FR. Renal diet' 8. New ESRD due to cardiorenal syndrome: On HD TTS --schedule at the end of the day to help with tolerance of therapy             --Daily weight  11/22- spoke with Renal- will change dry weight to help with LUE swelling- is foreign body reaction to fistula most likely- good thrill appearing; will also elevate LUE- was dialyzed Monday due to holiday schedule  11/23- looks ~ 40-50% better with K tape- con't elevation  and K tape  11/24- ~70% better- con't regimen with HD/elevation and K taping.   11/25- looks  stable- con't elevation and ktaping and more HD/per renal.  9. Demand ischemia/NSVT: On amiodarone daily 10. Thrombocytopenia: Platelets recovering from drop to 76-->108  11/21- Plts 208 today- con't to monitor 11. Anemia of chronic disease: ON Aranesp weekly for supplement.              CBC ordered for tomorrow  11/21- Hb a little decreased at 7.5- will recheck per renal  12. T2DM with hyperglycemia: Hgb  A1C-6.8.  Monitor BS ac/hs.  --Continue SSI as well as 4 units novolog TID with meals 11/25- BG's look better- 110-190- only 2 above 155- 171 and 190- con't regimen Monitor with increased mobility 13. Small acute left pontine infarct             See #2 14. S/p b/l BKA- chronic             Continue to monitor 15. Impaired cognition- mild to moderate with impulsiveness and poor safety awareness- will con't SLP and therapies, however this is pt's biggest limitation.  16. Constipation  11/24- will give Sorbitol 60cc at 11am and soap suds enema at 3-4 pm- so has time to work.    11/25- pt refused Enema and then took another dose of sorbitol, so 55ml yesterday- so is now cleaned out.   LOS: 5 days A FACE TO FACE EVALUATION WAS PERFORMED  Cadden Elizondo 01/30/2021, 10:59 AM
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
## 2021-01-30 NOTE — Progress Notes (Signed)
Physical Therapy Session Note  Patient Details  Name: [name] MRN: [id number] Date of Birth: August 18, 1950  Today's Date: 01/30/2021 PT Individual Time: 0900-0945 PT Individual Time Calculation (min): 45 min   Short Term Goals: Week 1:  PT Short Term Goal 1 (Week 1): Pt will demonstrate bed mobility with consistent CGA. PT Short Term Goal 2 (Week 1): Pt will don BLE prosthetics with all necessary parts for proper fit with MinA. PT Short Term Goal 3 (Week 1): Pt will perform sit<>stand  and pivot transfers with consistent Min/ ModA PT Short Term Goal 4 (Week 1): Pt will initiate gait training. PT Short Term Goal 5 (Week 1): Pt will initiate w/c mobility and parts management training.  Skilled Therapeutic Interventions/Progress Updates: Pt presented in bed agreeable to therapy. Nsg arrived to provide meds. Pt noted to have decreased swelling in LUE then when previously seen by this therapist. Pt agreeable to OOB. Performed supine to sit at EOB with minA and heavy use of bed feature. Pt attempted to scoot to EOB but required minA from PTA with use of draw sheet. PTA provided modA to don limb liners and total A for donning prothesis. PTA threaded pants maxA and pt was able to pull up above knees. Performed Sit to stand from elevated bed with CGA and PTA assised with pulling pants over hips. Pt transferred to w/c via Stedy and after brief rest pt performed additional Sit to stand from [place]. Pt was able to maintain standing in [place] and weight shifting in standing in [place]. Pt returned to w/c and remained there at end of session with call bell within reach, and LUE elevated on bed propped with pillows.      Therapy Documentation Precautions:  Precautions Precautions: Fall, Other (comment) Precaution Comments: bilateral BKA Required Braces or Orthoses: Other Brace Other Brace: Bilateral prostheses Restrictions Weight Bearing Restrictions: No General:   Vital Signs: Therapy Vitals Temp:  99.5 F (37.5 C) Temp Source: Oral Pulse Rate: 72 Resp: 16 BP: (!) 112/57 Patient Position (if appropriate): Lying Oxygen Therapy SpO2: 92 % O2 Device: Room Air Pain: Pain Assessment Pain Scale: 0-10 Pain Score: 0-No pain Mobility:   Locomotion :    Trunk/Postural Assessment :    Balance:   Exercises:   Other Treatments:      Therapy/Group: Individual Therapy  [name] 01/30/2021, 4:01 PM

## 2021-01-30 NOTE — Progress Notes (Signed)
Occupational Therapy Session Note  Patient Details  Name: [name] MRN: [id number] Date of Birth: 1950/03/24  Today's Date: 01/30/2021 OT Individual Time: 0017-4944 OT Individual Time Calculation (min): 54 min    Short Term Goals: Week 1:  OT Short Term Goal 1 (Week 1): PT will complete BSC or toilet transfer with 1 assist and LRAD OT Short Term Goal 2 (Week 1): Pt will don B prosthetics with supervision assistance OT Short Term Goal 3 (Week 1): Pt will sit EOB ~20 minutes during self care activity before returning to bed to rest in order to increase functional activity tolerance   Skilled Therapeutic Interventions/Progress Updates:    Pt greeted at time of session sitting up in wheelchair stating he was "exhausted" from today, difficulty staying awake at beginning of session. Pt on room air throughout, declining SOB throughout session. Attempted to check O2 but unable to get a reading during session and asymptomatic. Pt self propelling wheelchair approx 20 feet in hall with BUE propulsion and extended time. OT resuming and transported to main gym, performed 1x15 of the following at rebounder with 2kg ball: throw, throw + chest press, throw + overhead with cues for form. Pt seated with anterior weight shift and prosthetics on ground surface for increased core challenge. Transported back to room and sit <> stand in [place] x3 trials with CGA each trial. Pt wanting to return to bed, also with potential dialysis later today, Stedy wheelchair > bed. Pt doffing prosthetics today, and placed one compression sock on residual limb. Sit > supine CGA, LUE elevated on pillow. Applied K tape to LUE to further reduce swelling with good results noted from last taping. In bed resting with NT and son present, alarm on call bell in reach.   Therapy Documentation Precautions:  Precautions Precautions: Fall, Other (comment) Precaution Comments: bilateral BKA Required Braces or Orthoses: Other Brace Other  Brace: Bilateral prostheses Restrictions Weight Bearing Restrictions: No     Therapy/Group: Individual Therapy  Viona Gilmore 01/30/2021, 7:20 AM

## 2021-01-30 NOTE — Progress Notes (Signed)
Physical Therapy Session Note  Patient Details  Name: [name] MRN: [id number] Date of Birth: 09/27/1950  Today's Date: 01/30/2021 PT Individual Time: 1440-1540 PT Individual Time Calculation (min): 60 min   Short Term Goals: Week 1:  PT Short Term Goal 1 (Week 1): Pt will demonstrate bed mobility with consistent CGA. PT Short Term Goal 2 (Week 1): Pt will don BLE prosthetics with all necessary parts for proper fit with MinA. PT Short Term Goal 3 (Week 1): Pt will perform sit<>stand  and pivot transfers with consistent Min/ ModA PT Short Term Goal 4 (Week 1): Pt will initiate gait training. PT Short Term Goal 5 (Week 1): Pt will initiate w/c mobility and parts management training.  Skilled Therapeutic Interventions/Progress Updates:    Pt received seated in bed, agreeable to PT session. No complaints of pain this date. Semi-reclined in bed to sitting EOB with CGA for trunk elevation, HOB maximally elevated and use of bedrail. Pt is min A to don BLE prosthetics while sitting EOB, needs assist due to fatigue and increased time needed to don. Sit to stand with min A to stedy from elevated bed. Stedy transfer to w/c. Dependent transport room to/from therapy gym for time and energy conservation. Sit to stand 2 x 5 reps to stedy initially with Supervision progressing to [place] with onset of fatigue. Pt requires cues for upright stand with hip and trunk extension. Pt able to stand for about 20 sec with no UE support while standing in stedy. Sit to stand with min A in // bars x 2 reps. Ambulation x 10 ft forwards/backwards in // bars with min A for balance, heavy UE reliance. Pt reports fatigue following one round of gait and requests to return to room. Stedy transfer back to bed. Sit to supine CGA. Pt left seated in bed with needs in reach, bed alarm in place. Pt on room air throughout session, SpO2 remains at 96% and higher with activity.  Therapy Documentation Precautions:   Precautions Precautions: Fall, Other (comment) Precaution Comments: bilateral BKA Required Braces or Orthoses: Other Brace Other Brace: Bilateral prostheses Restrictions Weight Bearing Restrictions: No     Therapy/Group: Individual Therapy   Excell Seltzer, PT, DPT, CSRS  01/30/2021, 4:05 PM

## 2021-01-31 LAB — RENAL FUNCTION PANEL
Albumin: 2.7 g/dL — ABNORMAL LOW (ref 3.5–5.0)
Anion gap: 18 — ABNORMAL HIGH (ref 5–15)
BUN: 41 mg/dL — ABNORMAL HIGH (ref 8–23)
CO2: 20 mmol/L — ABNORMAL LOW (ref 22–32)
Calcium: 7.8 mg/dL — ABNORMAL LOW (ref 8.9–10.3)
Chloride: 93 mmol/L — ABNORMAL LOW (ref 98–111)
Creatinine, Ser: 6.83 mg/dL — ABNORMAL HIGH (ref 0.61–1.24)
GFR, Estimated: 8 mL/min — ABNORMAL LOW (ref >60)
Glucose, Bld: 126 mg/dL — ABNORMAL HIGH (ref 70–99)
Phosphorus: 5.4 mg/dL — ABNORMAL HIGH (ref 2.5–4.6)
Potassium: 4.3 mmol/L (ref 3.5–5.1)
Sodium: 131 mmol/L — ABNORMAL LOW (ref 135–145)

## 2021-01-31 LAB — GLUCOSE, CAPILLARY
Glucose-Capillary: 130 mg/dL — ABNORMAL HIGH (ref 70–99)
Glucose-Capillary: 141 mg/dL — ABNORMAL HIGH (ref 70–99)
Glucose-Capillary: 110 mg/dL — ABNORMAL HIGH (ref 70–99)

## 2021-01-31 LAB — CBC
HCT: 27.3 % — ABNORMAL LOW (ref 39.0–52.0)
Hemoglobin: 8.5 g/dL — ABNORMAL LOW (ref 13.0–17.0)
MCH: 29.3 pg (ref 26.0–34.0)
MCHC: 31.1 g/dL (ref 30.0–36.0)
MCV: 94.1 fL (ref 80.0–100.0)
Platelets: 227 10*3/uL (ref 150–400)
RBC: 2.9 MIL/uL — ABNORMAL LOW (ref 4.22–5.81)
RDW: 19.4 % — ABNORMAL HIGH (ref 11.5–15.5)
WBC: 8.2 10*3/uL (ref 4.0–10.5)
nRBC: 0.2 % (ref 0.0–0.2)

## 2021-01-31 MED ORDER — HEPARIN SODIUM (PORCINE) 1000 UNIT/ML DIALYSIS
40.0000 [IU]/kg | INTRAMUSCULAR | Status: DC | PRN
  Administered 2021-01-31: 4100 [IU] via INTRAVENOUS_CENTRAL
  Filled 2021-01-31: qty 5

## 2021-01-31 MED ORDER — SORBITOL 70 % SOLN
60.0000 mL | Freq: Once | Status: AC
  Administered 2021-01-31: 60 mL via ORAL

## 2021-01-31 NOTE — Progress Notes (Signed)
BP re-checked.

## 2021-01-31 NOTE — Progress Notes (Signed)
100 normal saline given. UF goal decreased.

## 2021-01-31 NOTE — Progress Notes (Signed)
Patient recvd back to the unit from dialysis. He is alert and oriented with no complaints of pain. Pt has placed an order for dinner and is currently resting in bed awaiting dinner.

## 2021-01-31 NOTE — Progress Notes (Signed)
Called hemodialysis and took report from [place] who advised patient is ready for transport back to the unit. Dialysis ran longer than usual because the machine had to be switched out. ook of 2.712L of fluid off.  Patient recvd heparin bolus and hep for cath. TPatients BP was low towards the end of dialysis, but had increased at recent vital sign check bp 113/60, HR 66, temp 98.6, R 15, 02 100%.

## 2021-01-31 NOTE — Progress Notes (Signed)
Patient ID: [name], male   DOB: 09/28/1950, 70 y.o.   MRN: [id number]  [facility] Progress Note   Assessment/ Plan:   1.  Deconditioning/mobility deficits: Following hospitalization and small acute left pontine infarct.  Continues to report making improvements with PT/OT and tentative discharge date set for 12/7. 2.  End-stage renal disease: From underlying progressive chronic kidney disease and exacerbated by cardiorenal syndrome now started on hemodialysis on a TTS schedule (on which he will be at [facility] following discharge) and will undergo hemodialysis later today.  Status post left upper arm AV graft on 01/19/2021 with ipsilateral arm swelling possibly from venous hypertension/postoperative edema that we are attempting to manage with UF.  Graft may be cannulated after 12/2 if edema resolved.  3. Anemia: Without overt blood loss. He had Aranesp given yesterday with labs showing uptrending hemoglobin and hematocrit. 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 5. Nutrition: Continue renal diet with ongoing renal multivitamin supplementation.  Continue nutritional supplementation as indicated by albumin levels. 6. Hypertension: Blood pressure acceptable, continue to monitor with dialysis.  Subjective:   Reports continued improvement of left arm swelling and denies any overnight complaints   Objective:   BP (!) 120/40 (BP Location: Left Arm)   Pulse 90   Temp 98.7 F (37.1 C) (Oral)   Resp 14   Ht 5\' 8"  (1.727 m)   Wt 105 kg   SpO2 98%   BMI 35.20 kg/m   Physical Exam: Gen: Comfortably sitting up on the side of his bed, working with PT CVS: Pulse regular rhythm, normal rate, S1 and S2 normal.  Right IJ TDC in place Resp: Clear to auscultation bilaterally, no rales/rhonchi Abd: Soft, obese, nontender, bowel sounds normal Ext: Status post bilateral below-knee amputations with socks over  stumps.  Edema noted of left upper extremity with audible bruit over graft.  Labs: BMET Recent Labs  Lab 01/25/21 0149 01/25/21 1535 01/28/21 1207  NA 133* 131* 133*  K 3.8 3.7 4.2  CL 96* 96* 95*  CO2 26 24 25   GLUCOSE 168* 150* 158*  BUN 31* 38* 37*  CREATININE 4.56* 5.18* 5.80*  CALCIUM 8.3* 8.1* 8.8*  PHOS  --  4.3 4.2   CBC Recent Labs  Lab 01/25/21 1535 01/26/21 1500 01/28/21 1207 01/31/21 0825  WBC 11.8* 9.0 9.4 8.2  HGB 7.8* 7.5* 8.4* 8.5*  HCT 24.9* 23.6* 26.5* 27.3*  MCV 94.0 94.0 95.0 94.1  PLT 195 209 238 227      Medications:     (feeding supplement) PROSource Plus  30 mL Oral BID BM   amiodarone  200 mg Oral Daily   aspirin EC  81 mg Oral q morning   atorvastatin  80 mg Oral QHS   Chlorhexidine Gluconate Cloth  6 each Topical Q0600   clopidogrel  75 mg Oral Daily   darbepoetin (ARANESP) injection - DIALYSIS  100 mcg Intravenous Q Thu-HD   feeding supplement (NEPRO CARB STEADY)  237 mL Oral BID BM   gabapentin  300 mg Oral QHS   insulin aspart  0-15 Units Subcutaneous TID WC   insulin aspart  4 Units Subcutaneous TID WC   mouth rinse  15 mL Mouth Rinse BID   multivitamin  1 tablet Oral QHS   pantoprazole  40 mg Oral Q0600   Elmarie Shiley, MD 01/31/2021, 8:45 AM

## 2021-01-31 NOTE — Progress Notes (Signed)
Physical Therapy Session Note  Patient Details  Name: [name] MRN: [id number] Date of Birth: 1950-12-31  Today's Date: 01/31/2021 PT Individual Time: 0800-0856 PT Individual Time Calculation (min): 56 min   Short Term Goals: Week 1:  PT Short Term Goal 1 (Week 1): Pt will demonstrate bed mobility with consistent CGA. PT Short Term Goal 2 (Week 1): Pt will don BLE prosthetics with all necessary parts for proper fit with MinA. PT Short Term Goal 3 (Week 1): Pt will perform sit<>stand  and pivot transfers with consistent Min/ ModA PT Short Term Goal 4 (Week 1): Pt will initiate gait training. PT Short Term Goal 5 (Week 1): Pt will initiate w/c mobility and parts management training.  Skilled Therapeutic Interventions/Progress Updates:  Pt received supine in bed asleep, easy to arouse. Pt denied pain and reported the swelling in his LUE was better. Pt very pleasant and agreeable to therapy. Emphasis of session on transfers and improved BLE strength. Visitor present in room and pt requested 5 minutes of alone time to speak with visitor, which therapist provided. Upon return, supine <>sit EOB w/CGA and donned pt's prostheses w/max A per pt request. Donned pants w/total A at EOB and lab present for blood draw. Set up room for stedy transfer during blood draw. Pt performed 5 sit <>stands from EOB to [facility] w/CGA-light min A as he fatigued, required lengthy rest break between stands. Pt able to hold stand for 20-60s at a time for improved BLE strength, min verbal cues for upright posture and forward gaze to reduce over reliance on BUEs. Pt very talkative during session, spoke about his children and grandchildren and looking forward to seeing them soon. Bed <>chair transfer via Stedy and pt was left seated in WC in room, all needs in reach.   Therapy Documentation Precautions:  Precautions Precautions: Fall, Other (comment) Precaution Comments: bilateral BKA Required Braces or Orthoses: Other  Brace Other Brace: Bilateral prostheses Restrictions Weight Bearing Restrictions: (P) No   Therapy/Group: Individual Therapy Cruzita Lederer [name], PT, DPT  01/31/2021, 7:42 AM

## 2021-01-31 NOTE — Progress Notes (Signed)
Occupational Therapy Session Note  Patient Details  Name: [name] MRN: [id number] Date of Birth: 07-30-1950  Today's Date: 01/31/2021 OT Individual Time: 5400-8676 OT Individual Time Calculation (min): 33 min  57 minutes missed  Short Term Goals: Week 1:  OT Short Term Goal 1 (Week 1): PT will complete BSC or toilet transfer with 1 assist and LRAD OT Short Term Goal 2 (Week 1): Pt will don B prosthetics with supervision assistance OT Short Term Goal 3 (Week 1): Pt will sit EOB ~20 minutes during self care activity before returning to bed to rest in order to increase functional activity tolerance  Skilled Therapeutic Interventions/Progress Updates:    Pt greeted in bed, asleep, difficult to awaken. Pt eventually woken, only agreeable to bedlevel exercises, would not transfer OOB. Guided pt through UB exercises using 4# bar x10-15 reps 1-2 sets per pts tolerance to participation. Pt intermittently falling asleep, needing A to increase alertness. Pt then refused to participate more, left him in bed with all needs within reach and bed alarm set. Tx time missed due to refusal/sleepiness.   Therapy Documentation Precautions:  Precautions Precautions: Fall, Other (comment) Precaution Comments: bilateral BKA Required Braces or Orthoses: Other Brace Other Brace: Bilateral prostheses Restrictions Weight Bearing Restrictions: (P) No  ADL: ADL Eating: Not assessed Grooming: Minimal assistance Where Assessed-Grooming: Edge of bed Upper Body Bathing: Supervision/safety Where Assessed-Upper Body Bathing: Edge of bed Lower Body Bathing: Minimal assistance Where Assessed-Lower Body Bathing: Bed level Upper Body Dressing: Moderate assistance Where Assessed-Upper Body Dressing: Edge of bed Lower Body Dressing: Not assessed Toileting: Not assessed Toilet Transfer: Not assessed Tub/Shower Transfer: Not assessed   Therapy/Group: Individual Therapy  Javin Nong A Jeyla Bulger 01/31/2021, 12:02  PM

## 2021-01-31 NOTE — Progress Notes (Signed)
Treatment remains in pause for machine testing.

## 2021-01-31 NOTE — Progress Notes (Signed)
Machine V105 stuck closed error. Treatment paused. Patient placed on another machine.

## 2021-02-01 LAB — GLUCOSE, CAPILLARY
Glucose-Capillary: 146 mg/dL — ABNORMAL HIGH (ref 70–99)
Glucose-Capillary: 129 mg/dL — ABNORMAL HIGH (ref 70–99)
Glucose-Capillary: 161 mg/dL — ABNORMAL HIGH (ref 70–99)
Glucose-Capillary: 124 mg/dL — ABNORMAL HIGH (ref 70–99)

## 2021-02-01 MED ORDER — SORBITOL 70 % SOLN
60.0000 mL | Freq: Once | Status: AC
  Administered 2021-02-01: 10:00:00 60 mL via ORAL
  Filled 2021-02-01: qty 60

## 2021-02-01 NOTE — Progress Notes (Signed)
Occupational Therapy Session Note  Patient Details  Name: [name] MRN: [id number] Date of Birth: 17-Aug-1950  Today's Date: 02/02/2021 OT Individual Time: 3704-8889 OT Individual Time Calculation (min): 55 min   Short Term Goals: Week 1:  OT Short Term Goal 1 (Week 1): PT will complete BSC or toilet transfer with 1 assist and LRAD OT Short Term Goal 2 (Week 1): Pt will don B prosthetics with supervision assistance OT Short Term Goal 3 (Week 1): Pt will sit EOB ~20 minutes during self care activity before returning to bed to rest in order to increase functional activity tolerance  Skilled Therapeutic Interventions/Progress Updates:    Pt greeted in the w/c wearing bilateral prosthetics with no c/o pain. When asked what he wanted to do, pt stated "walk." Therefore guided pt through 3 bouts of forward/backward stepping using the RW for balance support. Upgraded activity to two steps forward/backward, then 3 steps, and 4 steps. Mod A for initial sit<stand with vcs, and for remainder of stands pt able to power up with no more than Min A. Prolonged rest breaks in between bouts of stepping. Afterwards, pt completed B UE exercises using 3# dumbbells x9-15 reps x1-3 sets each exercise. Pt very limiting in regards to receiving instruction, stating "Don't make me ill. Don't push me." He did well with options and self guided exercise opportunities. Afterwards pt wanted to return to bed. Min A for stand pivot<bed using RW. Pt doffed B prosthetics given increased time and then returned to supine. Increased time required for making sure pt was left comfortably, pt does well with directing care. Left him with all needs within reach and bed alarm set. Tx focus placed on sit<stands, dynamic standing balance, Lt UE edema mgt, and UB strengthening for carryover during functional tasks and transfers.   Therapy Documentation Precautions:  Precautions Precautions: Fall, Other (comment) Precaution Comments:  bilateral BKA Required Braces or Orthoses: Other Brace Other Brace: Bilateral prostheses Restrictions Weight Bearing Restrictions: No ADL: ADL Eating: Not assessed Grooming: Minimal assistance Where Assessed-Grooming: Edge of bed Upper Body Bathing: Supervision/safety Where Assessed-Upper Body Bathing: Edge of bed Lower Body Bathing: Minimal assistance Where Assessed-Lower Body Bathing: Bed level Upper Body Dressing: Moderate assistance Where Assessed-Upper Body Dressing: Edge of bed Lower Body Dressing: Not assessed Toileting: Not assessed Toilet Transfer: Not assessed Tub/Shower Transfer: Not assessed  Therapy/Group: Individual Therapy  [name] A Shoshannah Faubert 02/02/2021, 3:36 PM

## 2021-02-01 NOTE — Progress Notes (Signed)
Patient ID: [name], male   DOB: Jul 10, 1950, 70 y.o.   MRN: [id number]  [facility] Progress Note   Assessment/ Plan:   1.  Deconditioning/mobility deficits: Following hospitalization and small acute left pontine infarct.  Continues to report making improvements with PT/OT and tentative discharge date set for 12/7. 2.  End-stage renal disease: From underlying progressive chronic kidney disease and exacerbated by cardiorenal syndrome-started on hemodialysis that he is now getting on TTS schedule (placed to chair at [facility]).  Status post left upper arm AV graft on 01/19/2021 with ipsilateral arm swelling possibly from venous hypertension versus postoperative edema being treated with ultrafiltration and arm elevation.  Graft may be cannulated after 12/2 if edema resolved.  If swelling persists, may need evaluation of CV stenosis. 3. Anemia: Without overt blood loss. He had Aranesp given yesterday with labs showing uptrending hemoglobin and hematocrit. 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 5. Nutrition: Continue renal diet with ongoing renal multivitamin supplementation.  Continue nutritional supplementation as indicated by albumin levels. 6. Hypertension: Blood pressure acceptable, continue to monitor with dialysis.  Subjective:   Concerned about yesterday recurrence of left upper arm swelling   Objective:   BP (!) 101/52 (BP Location: Left Arm)   Pulse 70   Temp 99.7 F (37.6 C) (Oral)   Resp 18   Ht 5\' 8"  (1.727 m)   Wt 103.8 kg   SpO2 97%   BMI 34.79 kg/m   Physical Exam: Gen: Resting comfortably in bed CVS: Pulse regular rhythm, normal rate, S1 and S2 normal.  Right IJ TDC in place Resp: Clear to auscultation bilaterally, no rales/rhonchi Abd: Soft, obese, nontender, bowel sounds normal Ext: Status post bilateral below-knee amputations with socks over stumps.  Edema noted of left upper extremity with audible  bruit over graft and intact surgical site.  Labs: BMET Recent Labs  Lab 01/25/21 1535 01/28/21 1207 01/31/21 0825  NA 131* 133* 131*  K 3.7 4.2 4.3  CL 96* 95* 93*  CO2 24 25 20*  GLUCOSE 150* 158* 126*  BUN 38* 37* 41*  CREATININE 5.18* 5.80* 6.83*  CALCIUM 8.1* 8.8* 7.8*  PHOS 4.3 4.2 5.4*   CBC Recent Labs  Lab 01/25/21 1535 01/26/21 1500 01/28/21 1207 01/31/21 0825  WBC 11.8* 9.0 9.4 8.2  HGB 7.8* 7.5* 8.4* 8.5*  HCT 24.9* 23.6* 26.5* 27.3*  MCV 94.0 94.0 95.0 94.1  PLT 195 209 238 227      Medications:     (feeding supplement) PROSource Plus  30 mL Oral BID BM   amiodarone  200 mg Oral Daily   atorvastatin  80 mg Oral QHS   Chlorhexidine Gluconate Cloth  6 each Topical Q0600   clopidogrel  75 mg Oral Daily   darbepoetin (ARANESP) injection - DIALYSIS  100 mcg Intravenous Q Thu-HD   feeding supplement (NEPRO CARB STEADY)  237 mL Oral BID BM   gabapentin  300 mg Oral QHS   insulin aspart  0-15 Units Subcutaneous TID WC   insulin aspart  4 Units Subcutaneous TID WC   mouth rinse  15 mL Mouth Rinse BID   multivitamin  1 tablet Oral QHS   pantoprazole  40 mg Oral Q0600   Elmarie Shiley, MD 02/01/2021, 8:33 AM

## 2021-02-02 LAB — GLUCOSE, CAPILLARY
Glucose-Capillary: 108 mg/dL — ABNORMAL HIGH (ref 70–99)
Glucose-Capillary: 87 mg/dL (ref 70–99)
Glucose-Capillary: 237 mg/dL — ABNORMAL HIGH (ref 70–99)
Glucose-Capillary: 118 mg/dL — ABNORMAL HIGH (ref 70–99)

## 2021-02-02 MED ORDER — CHLORHEXIDINE GLUCONATE CLOTH 2 % EX PADS: 6.0000 | MEDICATED_PAD | Freq: Every day | CUTANEOUS | Status: DC

## 2021-02-02 NOTE — Progress Notes (Signed)
Nephrologist MD on unit, stated that he could hear patient bruit with stethoscope, informed MD that multiple nurses listened and failed to find it by sound or touch, reassured that he found it with stethoscope and that "Crisis is over". Charge nurse, Katharine Look notified of results, will continue to monitor for changes, educated NT [place] on restricted extremities and how to identify them, verbalized understanding.

## 2021-02-02 NOTE — Progress Notes (Signed)
Nurse tech took blood pressure with results of 105/33, Tech informed other nurse on unit about low blood pressure and she informed the tech to take a manual. Tech found pt nurse and told me about blood pressure being low and was told to get manual. When I went into room to do manual I noticed patients left arm was out of the blankets and the right one was tucked in under the blankets. I knew immediately that the blood pressure was taken on a restricted extremity, and felt for bruit and thrill. Both were absent. I retook BP on other arm to get accurate reading with manual reading 128/78 02 -100% HR:69. I notified charge who confirmed thrill and bruit were absent, notified Dialysis and notified on call provider. Dialysis stated that they would call Dr and see what to do, on call provider advised to call Nephrology for further instructions.

## 2021-02-02 NOTE — Progress Notes (Signed)
Speech Language Pathology Weekly Progress and Session Note  Patient Details  Name: [name] MRN: [id number] Date of Birth: 22-Apr-1950  Beginning of progress report period: January 26, 2021 End of progress report period: February 02, 2021  Today's Date: 02/02/2021 SLP Individual Time: 1115-1200 SLP Individual Time Calculation (min): 45 min  Short Term Goals: Week 1: SLP Short Term Goal 1 (Week 1): Patient will participate in further cognitive-communication assessment SLP Short Term Goal 1 - Progress (Week 1): Met SLP Short Term Goal 2 (Week 1): Patient will consume current diet with effective mastication and oral clearance and without overt s/sx of aspiration with sup A verbal cues for implementation of swallow safety SLP Short Term Goal 2 - Progress (Week 1): Met SLP Short Term Goal 3 (Week 1): Patient will consume Dys 3 PO trials with improved mastication timeliness and effectiveness and without overt s/sx of aspiration SLP Short Term Goal 3 - Progress (Week 1): Discontinued (comment) (pt does not want to trial Dys 3 - remain Dys 2) SLP Short Term Goal 4 (Week 1): Patient will verbalize 2-3 physical and cognitive changes s/p CVA and how these changes may impact his functioning and safety upon discharge with min A verbal cues SLP Short Term Goal 4 - Progress (Week 1): Met SLP Short Term Goal 5 (Week 1): Patient will complete mildly complex problem solving tasks with min A verbal cues SLP Short Term Goal 5 - Progress (Week 1): Met    New Short Term Goals: Week 2: SLP Short Term Goal 1 (Week 2): STG = LTG d/t ELOS  Weekly Progress Updates: Pt has made functional gains and met 5 out of 5 short term goals this reporting period. Pt continues to be self-limiting with poor frustration tolerance, however insight and awareness into cognitive and physical impairments s/p CVA is improving. Pt benefits from min A verbal cues for mildly complex problem solving and will likely need 24/7  supervision at discharge and assist with higher level cognitive tasks (med management, money management, etc). Pt is currently tolerating a Dys 2 diet/thin liquids with intermittent supervision. Pt prefers this diet texture 2' dentures being lost and is not agreeable to more advance texture trials at this time. Pt will benefit from ongoing ST services during CIR stay to further promote safety, awareness and independence with daily routine before discharge home with recommended 24/7 supervision/assist. Pt educated on recommendations and POC and verbalizes understanding.    Intensity: Minumum of 1-2 x/day, 30 to 90 minutes Frequency: 3 to 5 out of 7 days Duration/Length of Stay: 12/7 Treatment/Interventions: Cognitive remediation/compensation;Cueing hierarchy;Functional tasks;Patient/family education;Therapeutic Activities   Daily Session  Skilled Therapeutic Interventions: Pt seen for skilled ST with focus on cognitive goals, pt sitting upright in wheelchair and relatively engaged in therapeutic tasks throughout. Pt continues to tolerate Dys 2 diet without interest in trialing upgraded textures at this time due to missing bottom dentures. Pt able to ID 3 physical/cognitive changes following CVA with min A verbal cues for how these changes will impact function at home. Pt did admit "I might have some slow thinking" demonstrating increasing awareness and insight vs evaluation. SLP facilitating mildly complex problem solving task with focus on home environment by providing min A verbal cues for 100% accuracy. At one point during activity, pt mildly agitated stating "Now why do you want to ask all these questions about my life?" Pt provided education and explanation on ST POC and rationale for current goals, pt agreeable and continued with task.  Pt left in wheelchair with belt alarm present and call button within reach to call NT when he couldn't tolerate being up and wheelchair any more (agreeable to stay up  for lunch meal). Cont ST POC.     General    Pain Pain Assessment Pain Scale: 0-10 Pain Score: 0-No pain  Therapy/Group: Individual Therapy  Dewaine Conger 02/02/2021, 12:31 PM

## 2021-02-02 NOTE — Progress Notes (Signed)
PROGRESS NOTE   Subjective/Complaints:   Pt sleeping- woke up briefly.  Off O2- denies issues.   ROS: Limited by sedation.    Objective:   No results found. Recent Labs    01/31/21 0825  WBC 8.2  HGB 8.5*  HCT 27.3*  PLT 227   Recent Labs    01/31/21 0825  NA 131*  K 4.3  CL 93*  CO2 20*  GLUCOSE 126*  BUN 41*  CREATININE 6.83*  CALCIUM 7.8*    Intake/Output Summary (Last 24 hours) at 02/02/2021 0823 Last data filed at 02/02/2021 0751 Gross per 24 hour  Intake 480 ml  Output --  Net 480 ml        Physical Exam: Vital Signs Blood pressure 110/83, pulse 62, temperature 97.8 F (36.6 C), temperature source Oral, resp. rate 18, height 5\' 8"  (1.727 m), weight 104.4 kg, SpO2 95 %.        General: asleep- woke briefly, but went back to sleep; NAD HENT: conjugate gaze; oropharynx moist CV: regular rate; no JVD Pulmonary: CTA B/L; no W/R/R- good air movement- Off O2! GI: soft, NT, ND, (+)BS Psychiatric: sleepy Neurological: sleeping  Skin- LUE has K tape on it- from fingers to L elbow- 70% better after HD- looks good below elbow- still somewhat swollen above elbow, but is better- no erythema; no heat Musculoskeletal:     Cervical back: Normal range of motion and neck supple.     Comments: B/l BKA with mild tenderness, mild edema  Skin:    General: Skin is warm and dry.  Neurological:     Mental Status: He is alert.     Comments: Alert Dysarthria Motor: Grossly 4/5, left weaker than right   Assessment/Plan: 1. Functional deficits which require 3+ hours per day of interdisciplinary therapy in a comprehensive inpatient rehab setting. Physiatrist is providing close team supervision and 24 hour management of active medical problems listed below. Physiatrist and rehab team continue to assess barriers to discharge/monitor patient progress toward functional and medical goals  Care  Tool:  Bathing    Body parts bathed by patient: Chest, Abdomen, Face   Body parts bathed by helper: Buttocks Body parts n/a: Right lower leg, Left lower leg   Bathing assist Assist Level: Moderate Assistance - Patient 50 - 74%     Upper Body Dressing/Undressing Upper body dressing   What is the patient wearing?: Pull over shirt    Upper body assist Assist Level: Moderate Assistance - Patient 50 - 74% (from EOB)    Lower Body Dressing/Undressing Lower body dressing    Lower body dressing activity did not occur: Refused What is the patient wearing?: Pants, Underwear/pull up     Lower body assist Assist for lower body dressing: Total Assistance - Patient < 25%     Toileting Toileting Toileting Activity did not occur Landscape architect and hygiene only): N/A (no void or bm)  Toileting assist       Transfers Chair/bed transfer  Transfers assist     Chair/bed transfer assist level: Dependent - Patient 0% (Stedy)     Locomotion Ambulation   Ambulation assist   Ambulation activity did not occur:  Safety/medical concerns          Walk 10 feet activity   Assist  Walk 10 feet activity did not occur: Safety/medical concerns        Walk 50 feet activity   Assist Walk 50 feet with 2 turns activity did not occur: Safety/medical concerns         Walk 150 feet activity   Assist Walk 150 feet activity did not occur: Safety/medical concerns         Walk 10 feet on uneven surface  activity   Assist Walk 10 feet on uneven surfaces activity did not occur: Safety/medical concerns         Wheelchair     Assist Is the patient using a wheelchair?: Yes Type of Wheelchair: Manual    Wheelchair assist level: Total Assistance - Patient < 25% Max wheelchair distance: 30 ft    Wheelchair 50 feet with 2 turns activity    Assist        Assist Level: Moderate Assistance - Patient 50 - 74%   Wheelchair 150 feet activity      Assist      Assist Level: Total Assistance - Patient < 25%   Blood pressure 110/83, pulse 62, temperature 97.8 F (36.6 C), temperature source Oral, resp. rate 18, height 5\' 8"  (1.727 m), weight 104.4 kg, SpO2 95 %.  Medical Problem List and Plan: 1.  Deficits with mobility, self-care, transfers, endurance secondary to L pontine stroke              -patient may not shower due to HD catheter             -ELOS/Goals: 10-14 days/Min A            Continue CIR- PT, OT and SLP  Set d/c- date as 12/7- cannot go to SNF due to insurance, since came here- biggest limitation is pt's cognition/unsafe behavior- refusing Slp regularly- con't PT/OT/SLP  Continue CIR- PT, OT and SLP 2.  Antithrombotics: -DVT/anticoagulation:  Mechanical: Sequential compression devices, below knee Bilateral lower extremities             -antiplatelet therapy: DAPT X 3 weeks followed by Plavix alone  11/25- changing to Plavix alone as of tomorrow 3. Pain Management: Tylenol prn.  4. Mood: LCSW to follow for evaluation and support.              -antipsychotic agents: N/A 5. Neuropsych: This patient is ?fully capable of making decisions on his own behalf. 6. Skin/Wound Care: Routine pressure relief measures.  7. Fluids/Electrolytes/Nutrition: Strict I/O. 1200 cc FR. Renal diet' 8. 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   11/25- looks stable- con't elevation and ktaping and more HD/per renal.   11/28- LUE less swollen- con't regimen, per renal.  9. Demand  ischemia/NSVT: On amiodarone daily 10. Thrombocytopenia: Platelets recovering from drop to 76-->108  11/21- Plts 208 today- con't to monitor 11. Anemia of chronic disease: ON  Aranesp weekly for supplement.              CBC ordered for tomorrow  11/21- Hb a little decreased at 7.5- will recheck per renal  12. T2DM with hyperglycemia: Hgb  A1C-6.8.  Monitor BS ac/hs.  --Continue SSI as well as 4 units novolog TID with meals 11/28- BG's 108-161- doing much better- con't regimen Monitor with increased mobility 13. Small acute left pontine infarct             See #2 14. S/p b/l BKA- chronic             Continue to monitor 15. Impaired cognition- mild to moderate with impulsiveness and poor safety awareness- will con't SLP and therapies, however this is pt's biggest limitation.  16. Constipation  11/24- will give Sorbitol 60cc at 11am and soap suds enema at 3-4 pm- so has time to work.    11/25- pt refused Enema and then took another dose of sorbitol, so 54ml yesterday- so is now cleaned out.   11/28- 2 BM's last night/overnight- incontinent- bowel meds prn.   LOS: 8 days A FACE TO FACE EVALUATION WAS PERFORMED  [name] 02/02/2021, 8:23 AM

## 2021-02-02 NOTE — Progress Notes (Signed)
Report received from pt's primary nurse, regarding absent bruit and thrill after blood pressure mistakenly taken on restricted extremity. On Call nephrologist notified face to face in Hemodialysis unit. No further instructions.

## 2021-02-02 NOTE — Progress Notes (Signed)
[facility] NEPHROLOGY PROGRESS NOTE  Assessment/ Plan:   # Deconditioning/mobility deficits: Following hospitalization and small acute left pontine infarct.  Continues to report making improvements with PT/OT and tentative discharge date set for 12/7.  #  End-stage renal disease: From underlying progressive chronic kidney disease and exacerbated by cardiorenal syndrome-started on hemodialysis that he is now getting on TTS schedule (placed to chair at [facility]).  Status post left upper arm AV graft on 01/19/2021 with ipsilateral arm swelling possibly from venous hypertension versus postoperative edema being treated with ultrafiltration and arm elevation.  Graft may be cannulated after 12/2 if edema resolved.  If swelling persists, may need evaluation of CV stenosis. Plan for next HD tomorrow.   # Anemia: Without overt blood loss.  Continue ESA, monitor hemoglobin.    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  #. Nutrition: Continue renal diet with ongoing renal multivitamin supplementation.  Continue nutritional supplementation as indicated by albumin levels.  # Hypertension: Blood pressure acceptable, continue to monitor with dialysis.  #Hyponatremia: Managed with dialysis, continue fluid restriction.  Subjective: Seen and examined.  Denies nausea, vomiting, chest pain, shortness of breath.  No new event. Objective Vital signs in last 24 hours: Vitals:   02/01/21 1909 02/02/21 0455 02/02/21 0508 02/02/21 0800  BP: 94/62 110/83    Pulse: 69 64  62  Resp: 16 18    Temp: 99.1 F (37.3 C) 97.8 F (36.6 C)    TempSrc: Oral Oral    SpO2: 100% 95%    Weight:   104.4 kg   Height:       Weight change: -1.1 kg  Intake/Output Summary (Last 24 hours) at 02/02/2021 1059 Last data filed at 02/02/2021 0751 Gross per 24 hour  Intake 480 ml  Output --  Net 480 ml       Labs: Basic Metabolic Panel: Recent Labs   Lab 01/28/21 1207 01/31/21 0825  NA 133* 131*  K 4.2 4.3  CL 95* 93*  CO2 25 20*  GLUCOSE 158* 126*  BUN 37* 41*  CREATININE 5.80* 6.83*  CALCIUM 8.8* 7.8*  PHOS 4.2 5.4*   Liver Function Tests: Recent Labs  Lab 01/28/21 1207 01/31/21 0825  ALBUMIN 2.9* 2.7*   No results for input(s): LIPASE, AMYLASE in the last 168 hours. No results for input(s): AMMONIA in the last 168 hours. CBC: Recent Labs  Lab 01/26/21 1500 01/28/21 1207 01/31/21 0825  WBC 9.0 9.4 8.2  HGB 7.5* 8.4* 8.5*  HCT 23.6* 26.5* 27.3*  MCV 94.0 95.0 94.1  PLT 209 238 227   Cardiac Enzymes: No results for input(s): CKTOTAL, CKMB, CKMBINDEX, TROPONINI in the last 168 hours. CBG: Recent Labs  Lab 02/01/21 0600 02/01/21 1146 02/01/21 1649 02/01/21 2116 02/02/21 0606  GLUCAP 146* 129* 161* 124* 108*    Iron Studies: No results for input(s): IRON, TIBC, TRANSFERRIN, FERRITIN in the last 72 hours. Studies/Results: No results found.  Medications: Infusions:   Scheduled Medications:  (feeding supplement) PROSource Plus  30 mL Oral BID BM   amiodarone  200 mg Oral Daily   atorvastatin  80 mg Oral QHS   Chlorhexidine Gluconate Cloth  6 each Topical Q0600   clopidogrel  75 mg Oral Daily   darbepoetin (ARANESP) injection - DIALYSIS  100 mcg Intravenous Q Thu-HD   feeding supplement (NEPRO CARB STEADY)  237 mL Oral BID BM   gabapentin  300 mg Oral QHS  insulin aspart  0-15 Units Subcutaneous TID WC   insulin aspart  4 Units Subcutaneous TID WC   mouth rinse  15 mL Mouth Rinse BID   multivitamin  1 tablet Oral QHS   pantoprazole  40 mg Oral Q0600    have reviewed scheduled and prn medications.  Physical Exam: General:NAD, comfortable Heart:RRR, s1s2 nl Lungs:clear b/l, no crackle Abdomen:soft, Non-tender, non-distended Extremities: Bilateral below-knee amputation Dialysis Access: Right IJ TDC, left upper extremity with AV fistula, mild edema noted.  [name]  [name] 02/02/2021,10:59 AM  LOS: 8 days

## 2021-02-02 NOTE — Progress Notes (Signed)
Physical Therapy Weekly Progress Note  Patient Details  Name: [name] MRN: [id number] Date of Birth: 10-02-1950  Beginning of progress report period: January 26, 2021 End of progress report period: January 10, 2021  Today's Date: 02/02/2021 PT Individual Time: 3825-0539 PT Individual Time Calculation (min): 75 min   Patient has met 2 of 4 short term goals.  Pt is not consistently performing Sit to stand with min A, but has at times. Pt has not initiated gait training d/t poor LE strength and endurance, and poor participation at times with therapy.  Patient continues to demonstrate the following deficits muscle weakness, decreased cardiorespiratoy endurance, and decreased sitting balance, decreased standing balance, decreased postural control, and decreased balance strategies and therefore will continue to benefit from skilled PT intervention to increase functional independence with mobility.  Patient progressing toward long term goals..  Continue plan of care.  PT Short Term Goals Week 1:  PT Short Term Goal 1 (Week 1): Pt will demonstrate bed mobility with consistent CGA. PT Short Term Goal 1 - Progress (Week 1): Met PT Short Term Goal 2 (Week 1): Pt will don BLE prosthetics with all necessary parts for proper fit with MinA. PT Short Term Goal 2 - Progress (Week 1): Met PT Short Term Goal 3 (Week 1): Pt will perform sit<>stand  and pivot transfers with consistent Min/ ModA PT Short Term Goal 3 - Progress (Week 1): Partly met PT Short Term Goal 4 (Week 1): Pt will initiate gait training. PT Short Term Goal 4 - Progress (Week 1): Progressing toward goal PT Short Term Goal 5 (Week 1): Pt will initiate w/c mobility and parts management training. PT Short Term Goal 5 - Progress (Week 1): Met Week 2:  PT Short Term Goal 1 (Week 2): =LTGs d/t ELOS  Skilled Therapeutic Interventions/Progress Updates:  pt received in bed and agreeable to therapy. No complaint of pain. Pt asleep on  arrival and easily roused, but grumpy about having been woken up and maintained this demeanor throughout session. Supine>sit with HOB elevated and close supervision. Pt donned with BLE prosthetics with min A for RLE to push into place, managed LLE with supervision only. Pt donned pants with tot A to thread and min to pull over hips in standing. Sit to stand to stedy and stedy transfer to w/c with min A fading to CGA. Pt propelled w/c with BUE x 60 ft with 1 seated rest break for endurance and functional mobility. Pt reports that pushing chair is hard on his LLE, but does not report pain or articulate other reasoning to discontinue exercise. Pt self terminates exercise, acknowledges need to work with therapy and that not participating fully could slow progress. Pt transported to therapy gym and performs Stand pivot transfer to mat table with min a to power up and for balance. Pt then participated in stepping onto targets with RW and CGA for balance.  Pt self-terminated this exercise, reporting "I've had enough," but refusing to expound when asked. Pt agreeable to different exercise. Pt performed step taps on 4" step 2 x 5 BIL. Pt returned to w/c with min A Stand pivot transfer with RW and was transported back to room. Pt remained in w/c and was left with all needs in reach and alarm active.     Ambulation/gait training;DME/adaptive equipment instruction;Community reintegration;Neuromuscular re-education;Psychosocial support;Stair training;UE/LE Strength taining/ROM;Wheelchair propulsion/positioning;UE/LE Coordination activities;Therapeutic Activities;Skin care/wound management;Pain management;Functional electrical stimulation;Balance/vestibular training;Discharge planning;Cognitive remediation/compensation;Functional mobility training;Patient/family education;Disease management/prevention;Splinting/orthotics;Therapeutic Exercise;Visual/perceptual remediation/compensation   Therapy Documentation Precautions:  Precautions Precautions: Fall, Other (comment) Precaution Comments: bilateral BKA Required Braces or Orthoses: Other Brace Other Brace: Bilateral prostheses Restrictions Weight Bearing Restrictions: No   Therapy/Group: Individual Therapy  [name] 02/02/2021, 4:21 PM

## 2021-02-03 LAB — CBC
HCT: 26.5 % — ABNORMAL LOW (ref 39.0–52.0)
Hemoglobin: 8.6 g/dL — ABNORMAL LOW (ref 13.0–17.0)
MCH: 30.4 pg (ref 26.0–34.0)
MCHC: 32.5 g/dL (ref 30.0–36.0)
MCV: 93.6 fL (ref 80.0–100.0)
Platelets: 214 10*3/uL (ref 150–400)
RBC: 2.83 MIL/uL — ABNORMAL LOW (ref 4.22–5.81)
RDW: 18.6 % — ABNORMAL HIGH (ref 11.5–15.5)
WBC: 8 10*3/uL (ref 4.0–10.5)
nRBC: 0 % (ref 0.0–0.2)

## 2021-02-03 LAB — RENAL FUNCTION PANEL
Albumin: 2.6 g/dL — ABNORMAL LOW (ref 3.5–5.0)
Anion gap: 12 (ref 5–15)
BUN: 38 mg/dL — ABNORMAL HIGH (ref 8–23)
CO2: 24 mmol/L (ref 22–32)
Calcium: 7.8 mg/dL — ABNORMAL LOW (ref 8.9–10.3)
Chloride: 95 mmol/L — ABNORMAL LOW (ref 98–111)
Creatinine, Ser: 6.72 mg/dL — ABNORMAL HIGH (ref 0.61–1.24)
GFR, Estimated: 8 mL/min — ABNORMAL LOW (ref >60)
Glucose, Bld: 106 mg/dL — ABNORMAL HIGH (ref 70–99)
Phosphorus: 4.3 mg/dL (ref 2.5–4.6)
Potassium: 3.9 mmol/L (ref 3.5–5.1)
Sodium: 131 mmol/L — ABNORMAL LOW (ref 135–145)

## 2021-02-03 LAB — GLUCOSE, CAPILLARY
Glucose-Capillary: 161 mg/dL — ABNORMAL HIGH (ref 70–99)
Glucose-Capillary: 83 mg/dL (ref 70–99)
Glucose-Capillary: 130 mg/dL — ABNORMAL HIGH (ref 70–99)

## 2021-02-03 MED ORDER — PENTAFLUOROPROP-TETRAFLUOROETH EX AERO: 1.0000 "application " | INHALATION_SPRAY | CUTANEOUS | Status: DC | PRN

## 2021-02-03 MED ORDER — HEPARIN SODIUM (PORCINE) 1000 UNIT/ML IJ SOLN
INTRAMUSCULAR | Status: AC
  Filled 2021-02-03: qty 4

## 2021-02-03 MED ORDER — HEPARIN SODIUM (PORCINE) 1000 UNIT/ML DIALYSIS: 20.0000 [IU]/kg | INTRAMUSCULAR | Status: DC | PRN

## 2021-02-03 MED ORDER — LIDOCAINE HCL (PF) 1 % IJ SOLN: 5.0000 mL | INTRAMUSCULAR | Status: DC | PRN

## 2021-02-03 MED ORDER — SODIUM CHLORIDE 0.9 % IV SOLN: 100.0000 mL | INTRAVENOUS | Status: DC | PRN

## 2021-02-03 MED ORDER — LIDOCAINE-PRILOCAINE 2.5-2.5 % EX CREA: 1.0000 "application " | TOPICAL_CREAM | CUTANEOUS | Status: DC | PRN

## 2021-02-03 MED ORDER — ALTEPLASE 2 MG IJ SOLR: 2.0000 mg | Freq: Once | INTRAMUSCULAR | Status: DC | PRN

## 2021-02-03 MED ORDER — HEPARIN SODIUM (PORCINE) 1000 UNIT/ML DIALYSIS
1000.0000 [IU] | INTRAMUSCULAR | Status: DC | PRN
  Administered 2021-02-03: 1000 [IU] via INTRAVENOUS_CENTRAL

## 2021-02-03 NOTE — Progress Notes (Signed)
LUE continues to be swollen this AM. Arm elevated on pillow. Fistula weak to palpate, thrill and bruit present with ascultation. No complications noted at this time.  Sheela Stack, LPN

## 2021-02-03 NOTE — Progress Notes (Signed)
PROGRESS NOTE   Subjective/Complaints:  Pt still off O2- up in bed eating breakfast- OT in room-  Per pt, voided yesterday- is also documented- said usually voids every 1-3 days.   Pt said sleepy, but is more awake than yesterday.  Per nursing, pt's BP was taken on his fistula yesterday- renal can hear bruit over fistula with stethoscope.    ROS:  Pt denies SOB, abd pain, CP, N/V/C/D, and vision changes  Objective:   No results found. No results for input(s): WBC, HGB, HCT, PLT in the last 72 hours.  No results for input(s): NA, K, CL, CO2, GLUCOSE, BUN, CREATININE, CALCIUM in the last 72 hours.   Intake/Output Summary (Last 24 hours) at 02/03/2021 0832 Last data filed at 02/03/2021 0824 Gross per 24 hour  Intake 500 ml  Output --  Net 500 ml        Physical Exam: Vital Signs Blood pressure (!) 146/68, pulse 68, temperature 98.8 F (37.1 C), temperature source Oral, resp. rate 18, height 5\' 8"  (1.727 m), weight 106.8 kg, SpO2 100 %.        General: awake, alert, appropriate, NAD HENT: conjugate gaze; oropharynx moist CV: regular rate; no JVD Pulmonary: CTA B/L; no W/R/R- good air movement GI: soft, NT, ND, (+)BS Psychiatric: appropriate; more itneractive Neurological: alert- mild sedation- cognitively- higher level issues  Skin-  swelling of L hand ~75% better; ~ 50% better in forearm and upper arm- is elevated - K tape off Musculoskeletal:     Cervical back: Normal range of motion and neck supple.     Comments: B/l BKA with mild tenderness, mild edema  Skin:    General: Skin is warm and dry.  Neurological:     Mental Status: He is alert.     Comments: Alert Dysarthria Motor: Grossly 4/5, left weaker than right   Assessment/Plan: 1. Functional deficits which require 3+ hours per day of interdisciplinary therapy in a comprehensive inpatient rehab setting. Physiatrist is providing close team  supervision and 24 hour management of active medical problems listed below. Physiatrist and rehab team continue to assess barriers to discharge/monitor patient progress toward functional and medical goals  Care Tool:  Bathing    Body parts bathed by patient: Chest, Abdomen, Face   Body parts bathed by helper: Buttocks Body parts n/a: Right lower leg, Left lower leg   Bathing assist Assist Level: Moderate Assistance - Patient 50 - 74%     Upper Body Dressing/Undressing Upper body dressing   What is the patient wearing?: Pull over shirt    Upper body assist Assist Level: Moderate Assistance - Patient 50 - 74% (from EOB)    Lower Body Dressing/Undressing Lower body dressing    Lower body dressing activity did not occur: Refused What is the patient wearing?: Pants, Underwear/pull up     Lower body assist Assist for lower body dressing: Total Assistance - Patient < 25%     Toileting Toileting Toileting Activity did not occur (Clothing management and hygiene only): N/A (no void or bm)  Toileting assist       Transfers Chair/bed transfer  Transfers assist     Chair/bed transfer assist level:  Dependent - Patient 0% Charlaine Dalton)     Locomotion Ambulation   Ambulation assist   Ambulation activity did not occur: Safety/medical concerns          Walk 10 feet activity   Assist  Walk 10 feet activity did not occur: Safety/medical concerns        Walk 50 feet activity   Assist Walk 50 feet with 2 turns activity did not occur: Safety/medical concerns         Walk 150 feet activity   Assist Walk 150 feet activity did not occur: Safety/medical concerns         Walk 10 feet on uneven surface  activity   Assist Walk 10 feet on uneven surfaces activity did not occur: Safety/medical concerns         Wheelchair     Assist Is the patient using a wheelchair?: Yes Type of Wheelchair: Manual    Wheelchair assist level: Total Assistance - Patient  < 25% Max wheelchair distance: 30 ft    Wheelchair 50 feet with 2 turns activity    Assist        Assist Level: Moderate Assistance - Patient 50 - 74%   Wheelchair 150 feet activity     Assist      Assist Level: Total Assistance - Patient < 25%   Blood pressure (!) 146/68, pulse 68, temperature 98.8 F (37.1 C), temperature source Oral, resp. rate 18, height 5\' 8"  (1.727 m), weight 106.8 kg, SpO2 100 %.  Medical Problem List and Plan: 1.  Deficits with mobility, self-care, transfers, endurance secondary to L pontine stroke              -patient may not shower due to HD catheter             -ELOS/Goals: 10-14 days/Min A            Continue CIR- PT, OT and SLP  Set d/c- date as 12/7- cannot go to SNF due to insurance, since came here- biggest limitation is pt's cognition/unsafe behavior- refusing Slp regularly- con't PT/OT/SLP  Continue CIR- PT, OT and SLP  Team conference today to f/u on progress 2.  Antithrombotics: -DVT/anticoagulation:  Mechanical: Sequential compression devices, below knee Bilateral lower extremities             -antiplatelet therapy: DAPT X 3 weeks followed by Plavix alone  11/25- changing to Plavix alone as of tomorrow 3. Pain Management: Tylenol prn.  4. Mood: LCSW to follow for evaluation and support.              -antipsychotic agents: N/A 5. Neuropsych: This patient is ?fully capable of making decisions on his own behalf. 6. Skin/Wound Care: Routine pressure relief measures.  7. Fluids/Electrolytes/Nutrition: Strict I/O. 1200 cc FR. Renal diet' 8. 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   11/25- looks stable- con't elevation and ktaping and more HD/per renal.   11/28- LUE less swollen- con't regimen,  per renal.   11/29- BP was accidentally taken over Fistula- per renal, can hear bruit with auscultation.  9. Demand ischemia/NSVT: On amiodarone daily 10. Thrombocytopenia: Platelets recovering from drop to 76-->108  11/21- Plts 208 today- con't to monitor 11. Anemia of chronic disease: ON Aranesp weekly for supplement.              CBC ordered for tomorrow  11/21- Hb a little decreased at 7.5- will recheck per renal   11/29- Last Hb 8.5- stable 12. T2DM with hyperglycemia: Hgb  A1C-6.8.  Monitor BS ac/hs.  --Continue SSI as well as 4 units novolog TID with meals 11/29- BG's 87 to 161 and 1 value of 237- con't regimen Monitor with increased mobility 13. Small acute left pontine infarct             See #2 14. S/p b/l BKA- chronic             Continue to monitor 15. Impaired cognition- mild to moderate with impulsiveness and poor safety awareness- will con't SLP and therapies, however this is pt's biggest limitation.  16. Constipation  11/24- will give Sorbitol 60cc at 11am and soap suds enema at 3-4 pm- so has time to work.    11/25- pt refused Enema and then took another dose of sorbitol, so 55ml yesterday- so is now cleaned out.   11/28- 2 BM's last night/overnight- incontinent- bowel meds prn. 17/ Sleepiness/sedation 11/29- much more awake- eating breakfast- has voided in last 24 hours- will wait to cath pt/looking for U/A and Cx- but ask HD to draw labs in HD. Make sure no leukocytosis.     LOS: 9 days A FACE TO FACE EVALUATION WAS PERFORMED  Devonne Lalani 02/03/2021, 8:32 AM

## 2021-02-03 NOTE — Progress Notes (Signed)
Speech Language Pathology Daily Session Note  Patient Details  Name: [name] MRN: [id number] Date of Birth: [birth year]  Today's Date: 02/03/2021 SLP Individual Time: 0830-0925 SLP Individual Time Calculation (min): 55 min  Short Term Goals: Week 2: SLP Short Term Goal 1 (Week 2): STG = LTG d/t ELOS  Skilled Therapeutic Interventions: Pt seen for skilled ST with focus on cognitive goals, pt up in wheelchair and brighter/more positive attitude than yesterday. Pt reports not having comprehensive knowledge of home medications at baseline, "I take about 9 pills but don't know all of them". SLP providing written list of current medications, dosage, frequency and why taken. Pt able to independently state reason for medication on 3/6 listed meds. SLP facilitating simple problem solving task related to medication management/organization by providing min A verbal cues for accuracy. Pt does have an extensive medical hx and demonstrates adequate awareness of medical limitations and precautions at this time. Pt left in wheelchair with alarm belt present, call button and cell phone within reach. Cont ST POC.   Pain Pain Assessment Pain Scale: 0-10 Pain Score: 0-No pain  Therapy/Group: Individual Therapy  Dewaine Conger 02/03/2021, 9:22 AM

## 2021-02-03 NOTE — Progress Notes (Signed)
Hemodialysis- Tolerated treatment well. UF goal 3.5L met. Reported off to primary RN. Patient alert, currently without complaints. Given warm blanket per request.

## 2021-02-03 NOTE — Progress Notes (Addendum)
Physical Therapy Session Note  Patient Details  Name: [name] MRN: [id number] Date of Birth: October 16, 1950  Today's Date: 02/03/2021 PT Individual Time: 1005-1101 PT Individual Time Calculation (min): 56 min   Short Term Goals: Week 1:  PT Short Term Goal 1 (Week 1): Pt will demonstrate bed mobility with consistent CGA. PT Short Term Goal 1 - Progress (Week 1): Met PT Short Term Goal 2 (Week 1): Pt will don BLE prosthetics with all necessary parts for proper fit with MinA. PT Short Term Goal 2 - Progress (Week 1): Met PT Short Term Goal 3 (Week 1): Pt will perform sit<>stand  and pivot transfers with consistent Min/ ModA PT Short Term Goal 3 - Progress (Week 1): Partly met PT Short Term Goal 4 (Week 1): Pt will initiate gait training. PT Short Term Goal 4 - Progress (Week 1): Progressing toward goal PT Short Term Goal 5 (Week 1): Pt will initiate w/c mobility and parts management training. PT Short Term Goal 5 - Progress (Week 1): Met Week 2:  PT Short Term Goal 1 (Week 2): =LTGs d/t ELOS  Skilled Therapeutic Interventions/Progress Updates:    pt received in w/c with BLE prosthetics in place and agreeable to therapy. No complaint of pain. Pt agreeable to participate on his terms and requested to walk. Pt transported to therapy gym for time management and energy conservation. Pt ambulated in bars with CGA 3 x 2 laps forward and backward walking. Extended rest breaks required throughout session. Pt then ambulated 2 x 50 ft, x100 ft with RW and close w/c follow. Cues for hip extension, RW proximity and upright posture with improvement that quickly faded and required continued multi modal cues. After first walk, pt stated, "I think I am going do about 15 more minutes and then I am going back to bed." Therapist provided education on the importance of therapy and full participation, pt was not receptive but did agree to ~30 more minutes of therapy. Pt returned to bed with Stand pivot transfer and  RW, min A. Pt directed care on returning to bed and getting settled. Pt remained in bed and was left with all needs in reach and alarm active. Pt refused 34 min of scheduled therapy.  Therapy Documentation Precautions:  Precautions Precautions: Fall, Other (comment) Precaution Comments: bilateral BKA Required Braces or Orthoses: Other Brace Other Brace: Bilateral prostheses Restrictions Weight Bearing Restrictions: No General: PT Amount of Missed Time (min): 34 Minutes PT Missed Treatment Reason: Patient unwilling to participate     Therapy/Group: Individual Therapy  [name] 02/03/2021, 11:28 AM

## 2021-02-03 NOTE — Patient Care Conference (Signed)
Inpatient RehabilitationTeam Conference and Plan of Care Update Date: 02/03/2021   Time: 11:29 AM    Patient Name: [name]      Medical Record Number: [id number]  Date of Birth: March 12, 1950 Sex: Male         Room/Bed: 4M10C/4M10C-01 Payor Info: Payor: [employer] / Plan: [facility] MEDICARE / Product Type: *No Product type* /    Admit Date/Time:  01/25/2021  3:09 PM  Primary Diagnosis:  Left pontine stroke [facility])  Hospital Problems: Principal Problem:   Left pontine stroke [facility]) Active Problems:   Debility    Expected Discharge Date: Expected Discharge Date: 02/11/21  Team Members Present: Physician leading conference: Dr. Courtney Heys Social Worker Present: Ovidio Kin, LCSW Nurse Present: Dorthula Nettles, RN PT Present: Ailene Rud, PT OT Present: Lillia Corporal, OT SLP Present: Lillie Columbia, SLP PPS Coordinator present : Gunnar Fusi, SLP     Current Status/Progress Goal Weekly Team Focus  Bowel/Bladder   pt is incont. of Bowel and anuric. LBM 11/27  decrease incontinent episodes  offer toileting during rounding.   Swallow/Nutrition/ Hydration   Mod I Dys 2  Mod I  Cont Dys 2/thin monitor, does not want to trial upgraded diet   ADL's   Mod-Max A overall, Mod A stand pivot transfers using RW though toilet transfer has not been attempted, toileting status undocumented  Min A overall  adaptive self care skills, functional trasnfers, OOB tolerance, dynamic balance, pt education   Mobility   (P) min A transfers, up to 4 steps with min A  (P) SUP for bed mobility, CGA/ MinA for transfers, MinA for gait/ stairs, SUP for w/c mobility  (P) gait transfers, w/c mobility   Communication             Safety/Cognition/ Behavioral Observations  min A cog  sup A  problem ID/problem solve, functional recall, awareness of current cog/physical deficits   Pain   Pt denies any pain  pt remains pain free  assess for pain qshift   Skin   Pt has bil. BKA (2013)  Pt  skin remains intact with no new breakdown  skin care qshift     Discharge Planning:  Ex-wife has not called worker back, unsure of plan but due to pt's insurance will need to DC home. Awaiting son's return call. Pt has not seen ex-wife since has been on rehab   Team Discussion: BP taken over fistula yesterday. Dialysis aware and assessed. Patient voided yesterday. Edema to the LUE, please keep elevated. Has not had HD since Friday of last week due to holiday. Labs to be drawn. Incontinent of bowel. HD TTS in a normal week. Requiring 2L, O2. Small fissure to bottom, applying barrier cream. Ex-wife is not returning SW phone calls. Patient on target to meet rehab goals: Min assist goals PT/OT, supervision SLP. Min/mod assist STS, walked 145ft min assist with close WC follow.working on problem solving, can do structured tasks with min assist.   *See Care Plan and progress notes for long and short-term goals.   Revisions to Treatment Plan:  Not at this time.  Teaching Needs: Family education, medication management, HD management, bowel/bladder management, skin/wound care, safety awareness, transfer/gait training, etc.  Current Barriers to Discharge: Decreased caregiver support, Incontinence, Wound care, Lack of/limited family support, Insurance for SNF coverage, Weight, Medication compliance, Behavior, and self-limiting.  Possible Resolutions to Barriers: Family education Time toileting schedule Follow up HD schedule Follow up HH/Outpatient therapy Encourage to participate  Medical Summary Current Status: last HD Friday due to holiday- did void yesterday- oliguric not anuric; no pain; fissure on bottom; incontinent of bowel;  Barriers to Discharge: Behavior;Decreased family/caregiver support;Home enviroment access/layout;Hemodialysis;Incontinence;Insurance for SNF coverage;Neurogenic Bowel & Bladder;Medical stability;Medication compliance;Weight;Weight bearing restrictions;Wound care   Barriers to Discharge Comments: biggest limiters himself- did walk 100 ft RW with PT- very unsafe; only participate if he wants to. w/c follow- a little better today; ex-wife hasn't returned calls- has son who also said working. SLP- min A problems solving- Possible Resolutions to [employer] Focus: goals min A- his behavior is the cause of limitations- working on this- D2 thins- won't upgrade- d/c 12/7- needs family training   Continued Need for Acute Rehabilitation Level of Care: The patient requires daily medical management by a physician with specialized training in physical medicine and rehabilitation for the following reasons: Direction of a multidisciplinary physical rehabilitation program to maximize functional independence : Yes Medical management of patient stability for increased activity during participation in an intensive rehabilitation regime.: Yes Analysis of laboratory values and/or radiology reports with any subsequent need for medication adjustment and/or medical intervention. : Yes   I attest that I was present, lead the team conference, and concur with the assessment and plan of the team.   Cristi Loron 02/03/2021, 5:30 PM

## 2021-02-03 NOTE — Progress Notes (Signed)
Spoke with ex-wife-Edna via telephone to discuss team conference pt's goals of min-CGA level and discharge date o 12/7. She reports she will be assisting him at discharge. Have scheduled her to come in Monday 12/5 @ 10:00-12:00 and 1:00-2:00 for all three therapies, so can be educated on his care needs. Asked if one of their son's needed to come also she reports she will ask them and see. She reports she will be the main caregiver at home, since she is there with him. Will continue to work on discharge needs and see pt once returns from HD

## 2021-02-03 NOTE — Progress Notes (Signed)
Occupational Therapy Weekly Progress Note  Patient Details  Name: [name] MRN: [id number] Date of Birth: 1950/07/27  Beginning of progress report period: January 26, 2021 End of progress report period: February 03, 2021  Today's Date: 02/03/2021 OT Individual Time: 7092-9574 OT Individual Time Calculation (min): 57 min    Patient has met 0 of 3 short term goals.  The pt is making slow progress toward OT goals, completing bed mobility with CGA/Min A, sit <> stands at [facility] with Min/Mod assist after progressing from [place] transfers, stand pivot transfers to/from bed <> wheelchair with Min A overall with RW, and LB dressing with Mod/Max A. Note the pt has been self limiting and resistant to education/self care techniques. Pt educated on importance of participation. Plan for upcoming education with family prior to DC.   Patient continues to demonstrate the following deficits: muscle weakness, decreased cardiorespiratoy endurance, impaired timing and sequencing, decreased coordination, and decreased motor planning, decreased motor planning, decreased initiation, decreased awareness, decreased problem solving, decreased safety awareness, and delayed processing, and decreased sitting balance, decreased standing balance, decreased postural control, and decreased balance strategies and therefore will continue to benefit from skilled OT intervention to enhance overall performance with BADL and Reduce care partner burden.  Patient slowly progressing toward long term goals..  Continue plan of care.  OT Short Term Goals Week 1:  OT Short Term Goal 1 (Week 1): PT will complete BSC or toilet transfer with 1 assist and LRAD OT Short Term Goal 1 - Progress (Week 1): Progressing toward goal OT Short Term Goal 2 (Week 1): Pt will don B prosthetics with supervision assistance OT Short Term Goal 2 - Progress (Week 1): Progressing toward goal OT Short Term Goal 3 (Week 1): Pt will sit EOB ~20 minutes during  self care activity before returning to bed to rest in order to increase functional activity tolerance OT Short Term Goal 3 - Progress (Week 1): Progressing toward goal Week 2:  OT Short Term Goal 1 (Week 2): Pt will perform toilet/BSC transfer with CGA and LRAD OT Short Term Goal 2 (Week 2): Pt will don B prosthetics with Supervision OT Short Term Goal 3 (Week 2): Pt will perform LB dress with AE PRN with Min A OT Short Term Goal 4 (Week 2): Pt will perform ADL/task of choice for 10 minutes with no rest break to improve global endurance  Skilled Therapeutic Interventions/Progress Updates:    Pt greeted at time of session semireclined in bed with NT setting him up in bed for breakfast. Pt provided a few minutes at beginning of session to eat breakfast with discussion and education throughout. Pt very upset that therapy "isnt giving me any time to eat breakfast" and explained that breakfast running late and on dialysis days, pt's morning will be busier. Note pt in frustrated mood throughout session, requiring encouragement to participate. Pt's brief noted to be torn, and sitting at EOB pt educated on lateral leans to remove old brief and place new brief. Educated on use of lateral leans at home during ADLs. Donned prosthetics at EOB, pt fatigued and leaning back on bed saying "I need a rest break" fatigued from putting on one prosthetic sleeve. OT resuming for time conservation, donning other sleeve and B prosthetics with pt sitting EOB. Donned pants at EOB, assist to thread feet into pants, sit > stand at RW Min/Mod to don over hips with assist. Stand pivot bed > wheelchair Min/Mod. Set up at sink and performed UB  bathe/dress with supervision. Applied K tape to LUE to reduce edema and improve swelling. Set up alarm on call bell in reach.    Therapy Documentation Precautions:  Precautions Precautions: Fall, Other (comment) Precaution Comments: bilateral BKA Required Braces or Orthoses: Other  Brace Other Brace: Bilateral prostheses Restrictions Weight Bearing Restrictions: No     Therapy/Group: Individual Therapy  Viona Gilmore 02/03/2021, 7:15 AM

## 2021-02-03 NOTE — Progress Notes (Signed)
[facility] NEPHROLOGY PROGRESS NOTE  Assessment/ Plan:   # Deconditioning/mobility deficits: Following hospitalization and small acute left pontine infarct.  Continues to report making improvements with PT/OT and tentative discharge date set for 12/7.  #  End-stage renal disease: From underlying progressive chronic kidney disease and exacerbated by cardiorenal syndrome-started on hemodialysis that he is now getting on TTS schedule (placed to chair at [facility]).  Status post left upper arm AV graft on 01/19/2021 with ipsilateral arm swelling possibly from venous hypertension versus postoperative edema being treated with ultrafiltration and arm elevation.  Graft may be cannulated after 12/2 if edema resolved.  If swelling persists, may need evaluation of CV stenosis. Plan for dialysis today.   # Anemia: Without overt blood loss.  Continue ESA, monitor hemoglobin.    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  #. Nutrition: Continue renal diet with ongoing renal multivitamin supplementation.  Continue nutritional supplementation as indicated by albumin levels.  # Hypertension: Blood pressure acceptable, continue to monitor with dialysis.  #Hyponatremia: Managed with dialysis, continue fluid restriction.  Subjective: Seen and examined.  Feeling tired after the therapy.  No chest pain, shortness of breath, nausea or vomiting.  Objective Vital signs in last 24 hours: Vitals:   02/02/21 1952 02/03/21 0359 02/03/21 0419 02/03/21 0815  BP: 128/78 (!) 91/59 (!) 146/68   Pulse:  68  67  Resp:  18    Temp:  98.8 F (37.1 C)    TempSrc:  Oral    SpO2:  100%    Weight:  106.8 kg    Height:       Weight change: 2.4 kg  Intake/Output Summary (Last 24 hours) at 02/03/2021 1237 Last data filed at 02/03/2021 0824 Gross per 24 hour  Intake 500 ml  Output --  Net 500 ml        Labs: Basic Metabolic Panel: Recent  Labs  Lab 01/28/21 1207 01/31/21 0825  NA 133* 131*  K 4.2 4.3  CL 95* 93*  CO2 25 20*  GLUCOSE 158* 126*  BUN 37* 41*  CREATININE 5.80* 6.83*  CALCIUM 8.8* 7.8*  PHOS 4.2 5.4*    Liver Function Tests: Recent Labs  Lab 01/28/21 1207 01/31/21 0825  ALBUMIN 2.9* 2.7*    No results for input(s): LIPASE, AMYLASE in the last 168 hours. No results for input(s): AMMONIA in the last 168 hours. CBC: Recent Labs  Lab 01/28/21 1207 01/31/21 0825  WBC 9.4 8.2  HGB 8.4* 8.5*  HCT 26.5* 27.3*  MCV 95.0 94.1  PLT 238 227    Cardiac Enzymes: No results for input(s): CKTOTAL, CKMB, CKMBINDEX, TROPONINI in the last 168 hours. CBG: Recent Labs  Lab 02/02/21 1136 02/02/21 1704 02/02/21 2108 02/03/21 0408 02/03/21 1108  GLUCAP 87 237* 118* 161* 83     Iron Studies: No results for input(s): IRON, TIBC, TRANSFERRIN, FERRITIN in the last 72 hours. Studies/Results: No results found.  Medications: Infusions:  sodium chloride     sodium chloride      Scheduled Medications:  (feeding supplement) PROSource Plus  30 mL Oral BID BM   amiodarone  200 mg Oral Daily   atorvastatin  80 mg Oral QHS   Chlorhexidine Gluconate Cloth  6 each Topical Q0600   clopidogrel  75 mg Oral Daily   darbepoetin (ARANESP) injection - DIALYSIS  100 mcg Intravenous Q Thu-HD   feeding supplement (NEPRO CARB STEADY)  237 mL Oral  BID BM   gabapentin  300 mg Oral QHS   insulin aspart  0-15 Units Subcutaneous TID WC   insulin aspart  4 Units Subcutaneous TID WC   mouth rinse  15 mL Mouth Rinse BID   multivitamin  1 tablet Oral QHS   pantoprazole  40 mg Oral Q0600    have reviewed scheduled and prn medications.  Physical Exam: General:NAD, comfortable Heart:RRR, s1s2 nl Lungs:clear b/l, no crackle Abdomen:soft, Non-tender, non-distended Extremities: Bilateral below-knee amputation Dialysis Access: Right IJ TDC, left upper extremity with AV fistula, edema noted which is stable.  Carole Deere Tanna Furry 02/03/2021,12:37 PM  LOS: 9 days

## 2021-02-04 LAB — GLUCOSE, CAPILLARY
Glucose-Capillary: 158 mg/dL — ABNORMAL HIGH (ref 70–99)
Glucose-Capillary: 121 mg/dL — ABNORMAL HIGH (ref 70–99)
Glucose-Capillary: 114 mg/dL — ABNORMAL HIGH (ref 70–99)
Glucose-Capillary: 113 mg/dL — ABNORMAL HIGH (ref 70–99)

## 2021-02-04 MED ORDER — CHLORHEXIDINE GLUCONATE CLOTH 2 % EX PADS
6.0000 | MEDICATED_PAD | Freq: Every day | CUTANEOUS | Status: DC
  Administered 2021-02-05 – 2021-02-06 (×2): 6 via TOPICAL

## 2021-02-04 MED ORDER — MELATONIN 5 MG PO TABS
5.0000 mg | ORAL_TABLET | Freq: Every evening | ORAL | Status: DC | PRN
  Administered 2021-02-04 – 2021-02-09 (×5): 5 mg via ORAL
  Filled 2021-02-04 (×5): qty 1

## 2021-02-04 NOTE — Progress Notes (Signed)
Occupational Therapy Session Note  Patient Details  Name: [name] MRN: [id number] Date of Birth: 1951/01/22  Today's Date: 02/04/2021 OT Individual Time: 2353-6144 and 1300-1326 OT Individual Time Calculation (min): 54 min and 26 min   Short Term Goals: Week 1:  OT Short Term Goal 1 (Week 1): PT will complete BSC or toilet transfer with 1 assist and LRAD OT Short Term Goal 1 - Progress (Week 1): Progressing toward goal OT Short Term Goal 2 (Week 1): Pt will don B prosthetics with supervision assistance OT Short Term Goal 2 - Progress (Week 1): Progressing toward goal OT Short Term Goal 3 (Week 1): Pt will sit EOB ~20 minutes during self care activity before returning to bed to rest in order to increase functional activity tolerance OT Short Term Goal 3 - Progress (Week 1): Progressing toward goal Week 2:  OT Short Term Goal 1 (Week 2): Pt will perform toilet/BSC transfer with CGA and LRAD OT Short Term Goal 2 (Week 2): Pt will don B prosthetics with Supervision OT Short Term Goal 3 (Week 2): Pt will perform LB dress with AE PRN with Min A OT Short Term Goal 4 (Week 2): Pt will perform ADL/task of choice for 10 minutes with no rest break to improve global endurance   Skilled Therapeutic Interventions/Progress Updates:    Session 1: Pt greeted at time of session semireclined in bed agreeable to OT session with encouragement. Note pt requiring extended amount of time for all tasks 2/2 slow movements and decreased motivation. Pt noted to have torn straps on brief, placed tape on brief to hold together for LB dressing tasks. Pt adamant that he wanted to don pants at EOB prior to getting into wheelchair to do bathing at sink level. Supine > sit CGA and donned prosthetics with set up/supervision. Assist to thread pants over feet and sit > stand to don over hips, Mod A overall. Note pt educated that would be easier to don pants prior to donning prosthetics to not need assist threading but pt  stating "this is how he has always done it" and will continue to do so. Stand pivot bed > wheelchair Min A overall with RW. Self propel to sink and performed UB bathing seated with Supervision/Set up. Pt declined toileting and therapist provided education on timed toileting to decrease occurrence of incontinence episodes. Pt declining replacing K tape at this time, despite education on improving edema. Pt positioned wheelchair level with LUE elevated and alarm on call bell in reach.    Session 2: Pt greeted at time of session up in wheelchair, agreeable to OT session. Note throughout session pt with limited verbal communication, not answering questions at times, and saying "you dont want to get on my bad side. Dont ask so many questions and we will be fine." Educated pt that his input Is needed at times. Pt wanting to put on prosthetic socks, provided to the patient and able to don with Set up but requesting assist with R prosthetic. Needing assist to click prosthetic into place on R side, able to L side without assist. Focused on sit <> stands x3 with MIN/CGA each trial, able to walk forward/back approx 5 steps with CGA. Pt up in chair with alarm on call bell in reach.   Therapy Documentation Precautions:  Precautions Precautions: Fall, Other (comment) Precaution Comments: bilateral BKA Required Braces or Orthoses: Other Brace Other Brace: Bilateral prostheses Restrictions Weight Bearing Restrictions: No     Therapy/Group: Individual Therapy  Viona Gilmore 02/04/2021, 7:17 AM

## 2021-02-04 NOTE — Plan of Care (Signed)
  Problem: Consults Goal: RH STROKE PATIENT EDUCATION Description: See Patient Education module for education specifics  Outcome: Progressing   Problem: RH BOWEL ELIMINATION Goal: RH STG MANAGE BOWEL WITH ASSISTANCE Description: STG Manage Bowel with  mod I Assistance. Outcome: Progressing Goal: RH STG MANAGE BOWEL W/MEDICATION W/ASSISTANCE Description: STG Manage Bowel with Medication with  mod I Assistance. Outcome: Progressing   Problem: RH SAFETY Goal: RH STG ADHERE TO SAFETY PRECAUTIONS W/ASSISTANCE/DEVICE Description: STG Adhere to Safety Precautions With cues Assistance/Device. Outcome: Progressing   Problem: RH KNOWLEDGE DEFICIT Goal: RH STG INCREASE KNOWLEDGE OF DIABETES Description: Patient will be able to manage DM with medications and dietary modifications using handouts and educational resources independently Outcome: Progressing Goal: RH STG INCREASE KNOWLEDGE OF HYPERTENSION Description: Patient will be able to manage HTN with medications and dietary modifications using handouts and educational resources independently Outcome: Progressing Goal: RH STG INCREASE KNOWLEDGE OF DYSPHAGIA/FLUID INTAKE Description: Patient will be able to manage Dysphagia, medications and dietary modifications using handouts and educational resources independently Outcome: Progressing Goal: RH STG INCREASE KNOWLEGDE OF HYPERLIPIDEMIA Description: Patient will be able to manage HLD with medications and dietary modifications using handouts and educational resources independently Outcome: Progressing Goal: RH STG INCREASE KNOWLEDGE OF STROKE PROPHYLAXIS Description: Patient will be able to manage secondary risks with medications and dietary modifications using handouts and educational resources independently Outcome: Progressing

## 2021-02-04 NOTE — Progress Notes (Signed)
Physical Therapy Session Note  Patient Details  Name: [name] MRN: [id number] Date of Birth: 1951-03-02  Today's Date: 02/04/2021 PT Individual Time: 1005-1100 PT Individual Time Calculation (min): 55 min   Short Term Goals: Week 2:  PT Short Term Goal 1 (Week 2): =LTGs d/t ELOS  Skilled Therapeutic Interventions/Progress Updates: Pt presented in w/c agreeable to therapy. Pt denies pain at start of session. Session focused on endurance via ambulation. Pt transported to rehab gym for energy conservation and performed short bout of ambulation ~5f with CGA. Pt was able to perform Sit to stand throughout session with CGA. Pt then ambulated ~871fwith RW and w/c follow. Pt states increased pain in hips (glutes) which he contributes to laying in bed. Pt then ascended/descended x 8 step (3in) with B rails and CGA. Pt noted to have forward flexed posture. Pt was able to perform alternating step ups to fatigue on 3 in step with B rails. PTA encouraged pt to improve erect posture to encourage increased glute activation. Pt then performed another bout of ambulation with w/c follow ~[id number] transported remaining distance to room and remained in w/c at end of session with seat alarm on, call bell within reach and needs met.      Therapy Documentation Precautions:  Precautions Precautions: Fall, Other (comment) Precaution Comments: bilateral BKA Required Braces or Orthoses: Other Brace Other Brace: Bilateral prostheses Restrictions Weight Bearing Restrictions: No General:   Vital Signs: Therapy Vitals Temp: 98.8 F (37.1 C) Pulse Rate: 68 Resp: 19 BP: (!) 100/41 Patient Position (if appropriate): Sitting Oxygen Therapy SpO2: 99 % O2 Device: Room Air Pain: Pain Assessment Pain Scale: 0-10 Pain Score: 0-No pain Mobility:   Locomotion :    Trunk/Postural Assessment :    Balance:   Exercises:   Other Treatments:      Therapy/Group: Individual Therapy  [name] 02/04/2021, 4:12 PM

## 2021-02-04 NOTE — Progress Notes (Signed)
PROGRESS NOTE   Subjective/Complaints: No issues overnite , participated in PT this am , pt states he is sleeping poorly here but does OK with sleep at home   ROS:  Pt denies SOB, abd pain, CP, N/V/C/D, and vision changes  Objective:   No results found. Recent Labs    02/03/21 1100  WBC 8.0  HGB 8.6*  HCT 26.5*  PLT 214    Recent Labs    02/03/21 1100  NA 131*  K 3.9  CL 95*  CO2 24  GLUCOSE 106*  BUN 38*  CREATININE 6.72*  CALCIUM 7.8*     Intake/Output Summary (Last 24 hours) at 02/04/2021 0841 Last data filed at 02/04/2021 0744 Gross per 24 hour  Intake 360 ml  Output 3500 ml  Net -3140 ml         Physical Exam: Vital Signs Blood pressure (!) 96/50, pulse 71, temperature 99.6 F (37.6 C), temperature source Oral, resp. rate 18, height 5\' 8"  (1.727 m), weight 103.3 kg, SpO2 96 %.   General: No acute distress Mood and affect are appropriate Heart: Regular rate and rhythm no rubs murmurs or extra sounds Lungs: Clear to auscultation, breathing unlabored, no rales or wheezes Abdomen: Positive bowel sounds, soft nontender to palpation, nondistended Extremities: diffuse swelling LUE no erythema or tenderness Skin: No evidence of breakdown, no evidence of rash  Musculoskeletal:     Cervical back: Normal range of motion and neck supple.     Comments: B/l BKA with mild tenderness, mild edema  Skin:    General: Skin is warm and dry.  Neurological:     Mental Status: He is alert.     Comments: Alert Dysarthria Motor: Grossly 4/5, left weaker than right   Assessment/Plan: 1. Functional deficits which require 3+ hours per day of interdisciplinary therapy in a comprehensive inpatient rehab setting. Physiatrist is providing close team supervision and 24 hour management of active medical problems listed below. Physiatrist and rehab team continue to assess barriers to discharge/monitor patient  progress toward functional and medical goals  Care Tool:  Bathing    Body parts bathed by patient: Chest, Abdomen, Face   Body parts bathed by helper: Buttocks Body parts n/a: Right lower leg, Left lower leg   Bathing assist Assist Level: Moderate Assistance - Patient 50 - 74%     Upper Body Dressing/Undressing Upper body dressing   What is the patient wearing?: Pull over shirt    Upper body assist Assist Level: Moderate Assistance - Patient 50 - 74% (from EOB)    Lower Body Dressing/Undressing Lower body dressing    Lower body dressing activity did not occur: Refused What is the patient wearing?: Pants, Underwear/pull up     Lower body assist Assist for lower body dressing: Total Assistance - Patient < 25%     Toileting Toileting Toileting Activity did not occur Landscape architect and hygiene only): N/A (no void or bm)  Toileting assist       Transfers Chair/bed transfer  Transfers assist     Chair/bed transfer assist level: Minimal Assistance - Patient > 75%     Locomotion Ambulation   Ambulation assist   Ambulation  activity did not occur: Safety/medical concerns  Assist level: Minimal Assistance - Patient > 75% Assistive device: Walker-rolling Max distance: 100 ft   Walk 10 feet activity   Assist  Walk 10 feet activity did not occur: Safety/medical concerns  Assist level: Minimal Assistance - Patient > 75% Assistive device: Walker-rolling   Walk 50 feet activity   Assist Walk 50 feet with 2 turns activity did not occur: Safety/medical concerns  Assist level: Minimal Assistance - Patient > 75% Assistive device: Walker-rolling    Walk 150 feet activity   Assist Walk 150 feet activity did not occur: Safety/medical concerns         Walk 10 feet on uneven surface  activity   Assist Walk 10 feet on uneven surfaces activity did not occur: Safety/medical concerns         Wheelchair     Assist Is the patient using a  wheelchair?: Yes Type of Wheelchair: Manual    Wheelchair assist level: Total Assistance - Patient < 25% Max wheelchair distance: 30 ft    Wheelchair 50 feet with 2 turns activity    Assist        Assist Level: Moderate Assistance - Patient 50 - 74%   Wheelchair 150 feet activity     Assist      Assist Level: Total Assistance - Patient < 25%   Blood pressure (!) 96/50, pulse 71, temperature 99.6 F (37.6 C), temperature source Oral, resp. rate 18, height 5\' 8"  (1.727 m), weight 103.3 kg, SpO2 96 %.  Medical Problem List and Plan: 1.  Deficits with mobility, self-care, transfers, endurance secondary to L pontine stroke              -patient may not shower due to HD catheter             -ELOS/Goals: 10-14 days/Min A            Continue CIR- PT, OT and SLP  Set d/c- date as 12/7- cannot go to SNF due to insurance, since came here- biggest limitation is pt's cognition/unsafe behavior- refusing Slp regularly- con't PT/OT/SLP  Continue CIR- PT, OT and SLP  Team conference today to f/u on progress 2.  Antithrombotics: -DVT/anticoagulation:  Mechanical: Sequential compression devices, below knee Bilateral lower extremities             -antiplatelet therapy: DAPT X 3 weeks followed by Plavix alone  11/25- changing to Plavix alone as of tomorrow 3. Pain Management: Tylenol prn.  4. Mood: LCSW to follow for evaluation and support.              -antipsychotic agents: N/A 5. Neuropsych: This patient is ?fully capable of making decisions on his own behalf. 6. Skin/Wound Care: Routine pressure relief measures.  7. Fluids/Electrolytes/Nutrition: Strict I/O. 1200 cc FR. Renal diet' 8. 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 Demand ischemia/NSVT: On amiodarone daily 10. Thrombocytopenia: Platelets recovering from drop to  76-->108  11/21- Plts 208 today- con't to monitor 11. Anemia of chronic disease: ON Aranesp weekly for supplement.              CBC ordered for tomorrow  11/21- Hb a little decreased at 7.5- will recheck per renal   11/29- Last Hb 8.5- stable  12. T2DM with hyperglycemia: Hgb  A1C-6.8.  Monitor BS ac/hs.  --Continue SSI as well as 4 units novolog TID with meals 11/29- BG's 87 to 161 and 1 value of 237- con't regimen Monitor with increased mobility 13. Small acute left pontine infarct             See #2 14. S/p b/l BKA- chronic             Continue to monitor- pt states he was amb with Bilateral prosthetics  15. Impaired cognition- mild to moderate with impulsiveness and poor safety awareness- will con't SLP and therapies, however this is pt's biggest limitation.  16. Constipation  11/24- will give Sorbitol 60cc at 11am and soap suds enema at 3-4 pm- so has time to work.    11/25- pt refused Enema and then took another dose of sorbitol, so 59ml yesterday- so is now cleaned out.   11/28- 2 BM's last night/overnight- incontinent- bowel meds prn. 17/ Sleepiness/sedation Alert this am , BMET without changes compared to 4 d earlier    LOS: 10 days A FACE TO FACE EVALUATION WAS PERFORMED  Charlett Blake 02/04/2021, 8:41 AM

## 2021-02-04 NOTE — Progress Notes (Addendum)
[facility] NEPHROLOGY PROGRESS NOTE  Assessment/ Plan:   # Deconditioning/mobility deficits: Following hospitalization and small acute left pontine infarct.  Continues to report making improvements with PT/OT and tentative discharge date set for 12/7.  #  End-stage renal disease: From underlying progressive chronic kidney disease and exacerbated by cardiorenal syndrome-started on hemodialysis that he is now getting on TTS schedule (placed to chair at [facility]).  Status post left upper arm AV graft on 01/19/2021 with ipsilateral arm swelling possibly from venous hypertension versus postoperative edema being treated with ultrafiltration and arm elevation.  Graft may be cannulated after 12/2 if edema resolved.  If swelling persists, may need evaluation of CV stenosis.  Status post dialysis yesterday with 3.5 L ultrafiltration, tolerated well.  Plan for next HD tomorrow.  # Anemia: Without overt blood loss.  Continue ESA, monitor hemoglobin.    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  #. Nutrition: Continue renal diet with ongoing renal multivitamin supplementation.  Continue nutritional supplementation as indicated by albumin levels.  # Hypertension: Blood pressure soft, not on antihypertensive.  Asymptomatic.  Monitor BP.   #Hyponatremia: Managed with dialysis, continue fluid restriction.  Subjective: Seen and examined.  No new event.  Working with therapy. Objective Vital signs in last 24 hours: Vitals:   02/03/21 1657 02/03/21 1731 02/03/21 2005 02/04/21 0514  BP: (!) 102/56 (!) 102/54 (!) 101/46 (!) 96/50  Pulse:  80 78 71  Resp:  19 19 18   Temp:  98.6 F (37 C) 99 F (37.2 C) 99.6 F (37.6 C)  TempSrc:  Oral  Oral  SpO2:  98% 97% 96%  Weight:      Height:       Weight change: 0 kg  Intake/Output Summary (Last 24 hours) at 02/04/2021 1127 Last data filed at 02/04/2021 0744 Gross per 24 hour  Intake  360 ml  Output 3500 ml  Net -3140 ml        Labs: Basic Metabolic Panel: Recent Labs  Lab 01/28/21 1207 01/31/21 0825 02/03/21 1100  NA 133* 131* 131*  K 4.2 4.3 3.9  CL 95* 93* 95*  CO2 25 20* 24  GLUCOSE 158* 126* 106*  BUN 37* 41* 38*  CREATININE 5.80* 6.83* 6.72*  CALCIUM 8.8* 7.8* 7.8*  PHOS 4.2 5.4* 4.3    Liver Function Tests: Recent Labs  Lab 01/28/21 1207 01/31/21 0825 02/03/21 1100  ALBUMIN 2.9* 2.7* 2.6*    No results for input(s): LIPASE, AMYLASE in the last 168 hours. No results for input(s): AMMONIA in the last 168 hours. CBC: Recent Labs  Lab 01/28/21 1207 01/31/21 0825 02/03/21 1100  WBC 9.4 8.2 8.0  HGB 8.4* 8.5* 8.6*  HCT 26.5* 27.3* 26.5*  MCV 95.0 94.1 93.6  PLT 238 227 214    Cardiac Enzymes: No results for input(s): CKTOTAL, CKMB, CKMBINDEX, TROPONINI in the last 168 hours. CBG: Recent Labs  Lab 02/02/21 2108 02/03/21 0408 02/03/21 1108 02/03/21 1729 02/04/21 0702  GLUCAP 118* 161* 83 130* 158*     Iron Studies: No results for input(s): IRON, TIBC, TRANSFERRIN, FERRITIN in the last 72 hours. Studies/Results: No results found.  Medications: Infusions:    Scheduled Medications:  (feeding supplement) PROSource Plus  30 mL Oral BID BM   amiodarone  200 mg Oral Daily   atorvastatin  80 mg Oral QHS   Chlorhexidine Gluconate Cloth  6 each Topical Q0600   clopidogrel  75 mg Oral  Daily   darbepoetin (ARANESP) injection - DIALYSIS  100 mcg Intravenous Q Thu-HD   feeding supplement (NEPRO CARB STEADY)  237 mL Oral BID BM   gabapentin  300 mg Oral QHS   insulin aspart  0-15 Units Subcutaneous TID WC   insulin aspart  4 Units Subcutaneous TID WC   mouth rinse  15 mL Mouth Rinse BID   multivitamin  1 tablet Oral QHS   pantoprazole  40 mg Oral Q0600    have reviewed scheduled and prn medications.  Physical Exam: General:NAD, comfortable Heart:RRR, s1s2 nl Lungs:clear b/l, no crackle Abdomen:soft, Non-tender,  non-distended Extremities: Bilateral below-knee amputation Dialysis Access: Right IJ TDC, left upper extremity with AV fistula, edema noted which is stable.  [name] 02/04/2021,11:27 AM  LOS: 10 days

## 2021-02-04 NOTE — Progress Notes (Signed)
SLP Cancellation Note  Patient Details Name: [name] MRN: [id number] DOB: Feb 13, 1951   Cancelled treatment:  SLP entering pt room for tx, pt upright in wheelchair and initially responding to basic questions regarding events of day and pain. Quickly, pt yelling "Now are we done?". SLP providing education regarding role of therapist and scheduled 60 minute session. Patient yelling "quit with all this talking. I can't stand all these questions they make me irritated. Leave." Pt not agreeable to therapy at this time despite education and attempted re-direction, left in room with alarm belt on and needs within reach.      Dewaine Conger 02/04/2021, 3:14 PM

## 2021-02-05 LAB — GLUCOSE, CAPILLARY
Glucose-Capillary: 127 mg/dL — ABNORMAL HIGH (ref 70–99)
Glucose-Capillary: 147 mg/dL — ABNORMAL HIGH (ref 70–99)
Glucose-Capillary: 130 mg/dL — ABNORMAL HIGH (ref 70–99)

## 2021-02-05 LAB — RENAL FUNCTION PANEL
Albumin: 2.6 g/dL — ABNORMAL LOW (ref 3.5–5.0)
Anion gap: 12 (ref 5–15)
BUN: 36 mg/dL — ABNORMAL HIGH (ref 8–23)
CO2: 27 mmol/L (ref 22–32)
Calcium: 7.6 mg/dL — ABNORMAL LOW (ref 8.9–10.3)
Chloride: 90 mmol/L — ABNORMAL LOW (ref 98–111)
Creatinine, Ser: 5.74 mg/dL — ABNORMAL HIGH (ref 0.61–1.24)
GFR, Estimated: 10 mL/min — ABNORMAL LOW (ref >60)
Glucose, Bld: 157 mg/dL — ABNORMAL HIGH (ref 70–99)
Phosphorus: 4.3 mg/dL (ref 2.5–4.6)
Potassium: 4.2 mmol/L (ref 3.5–5.1)
Sodium: 129 mmol/L — ABNORMAL LOW (ref 135–145)

## 2021-02-05 LAB — CBC
HCT: 25.9 % — ABNORMAL LOW (ref 39.0–52.0)
Hemoglobin: 8.3 g/dL — ABNORMAL LOW (ref 13.0–17.0)
MCH: 29.5 pg (ref 26.0–34.0)
MCHC: 32 g/dL (ref 30.0–36.0)
MCV: 92.2 fL (ref 80.0–100.0)
Platelets: 207 10*3/uL (ref 150–400)
RBC: 2.81 MIL/uL — ABNORMAL LOW (ref 4.22–5.81)
RDW: 18 % — ABNORMAL HIGH (ref 11.5–15.5)
WBC: 7 10*3/uL (ref 4.0–10.5)
nRBC: 0 % (ref 0.0–0.2)

## 2021-02-05 MED ORDER — LIDOCAINE-PRILOCAINE 2.5-2.5 % EX CREA: 1.0000 "application " | TOPICAL_CREAM | CUTANEOUS | Status: DC | PRN

## 2021-02-05 MED ORDER — PENTAFLUOROPROP-TETRAFLUOROETH EX AERO: 1.0000 "application " | INHALATION_SPRAY | CUTANEOUS | Status: DC | PRN

## 2021-02-05 MED ORDER — SODIUM CHLORIDE 0.9 % IV SOLN: 100.0000 mL | INTRAVENOUS | Status: DC | PRN

## 2021-02-05 MED ORDER — POLYETHYLENE GLYCOL 3350 17 G PO PACK
17.0000 g | PACK | Freq: Every day | ORAL | Status: DC
  Administered 2021-02-05 – 2021-02-07 (×3): 17 g via ORAL
  Filled 2021-02-05 (×3): qty 1

## 2021-02-05 MED ORDER — HEPARIN SODIUM (PORCINE) 1000 UNIT/ML DIALYSIS
1000.0000 [IU] | INTRAMUSCULAR | Status: DC | PRN
  Filled 2021-02-05: qty 1

## 2021-02-05 MED ORDER — HEPARIN SODIUM (PORCINE) 1000 UNIT/ML DIALYSIS: 20.0000 [IU]/kg | INTRAMUSCULAR | Status: DC | PRN

## 2021-02-05 MED ORDER — LIDOCAINE HCL (PF) 1 % IJ SOLN: 5.0000 mL | INTRAMUSCULAR | Status: DC | PRN

## 2021-02-05 MED ORDER — ALTEPLASE 2 MG IJ SOLR: 2.0000 mg | Freq: Once | INTRAMUSCULAR | Status: DC | PRN

## 2021-02-05 NOTE — Progress Notes (Signed)
Speech Language Pathology Daily Session Note  Patient Details  Name: [name] MRN: [id number] Date of Birth: [birth year]  Today's Date: 02/05/2021 SLP Individual Time: 1100-1135 SLP Individual Time Calculation (min): 35 min  Short Term Goals: Week 2: SLP Short Term Goal 1 (Week 2): STG = LTG d/t ELOS  Skilled Therapeutic Interventions:   Patient seen for skilled ST session focusing on cognitve function goals. Patient transferring into bed from [facility] with nursing assistance when SLP arrived. SLP discussed patient's recent comments regarding not wanting to participate in speech therapy. He stated he did not feel he needed speech therapy and when SLP discussed his previously documented statements of feeling like his brain processing was slow, he said ,"its not now". Patient denied the fact that he did not participate in full OT session this morning. He was able to verbalize his needs and demonstrated safety and accuracy with doffing his prosthetic legs. He was off by one day when asked about today's date (stated the 2nd).  When asked about him refusing ST yesterday, patient reported that he does not like being asked many questions and it irritates him. He said this is nothing new and he likes to do his own thing and be mostly by himself. Patient was becoming focused on dialysis which is occurring sometime after 12 today and he started responding/participating less and session was ended early. Patient continues to benefit from skilled SLP intervention to maximize cognitive function goals prior to discharge.  Pain Pain Assessment Pain Scale: 0-10 Pain Score: 0-No pain  Therapy/Group: Individual Therapy  Sonia Baller, MA, CCC-SLP Speech Therapy

## 2021-02-05 NOTE — Progress Notes (Signed)
Occupational Therapy Session Note  Patient Details  Name: [name] MRN: [id number] Date of Birth: [birth year]  Today's Date: 02/05/2021 OT Individual Time: 5400-8676 OT Individual Time Calculation (min): 25 min  and Today's Date: 02/05/2021 OT Missed Time: 35 Minutes Missed Time Reason: Patient fatigue;Patient unwilling/refused to participate without medical reason   Short Term Goals: Week 1:  OT Short Term Goal 1 (Week 1): PT will complete BSC or toilet transfer with 1 assist and LRAD OT Short Term Goal 1 - Progress (Week 1): Progressing toward goal OT Short Term Goal 2 (Week 1): Pt will don B prosthetics with supervision assistance OT Short Term Goal 2 - Progress (Week 1): Progressing toward goal OT Short Term Goal 3 (Week 1): Pt will sit EOB ~20 minutes during self care activity before returning to bed to rest in order to increase functional activity tolerance OT Short Term Goal 3 - Progress (Week 1): Progressing toward goal Week 2:  OT Short Term Goal 1 (Week 2): Pt will perform toilet/BSC transfer with CGA and LRAD OT Short Term Goal 2 (Week 2): Pt will don B prosthetics with Supervision OT Short Term Goal 3 (Week 2): Pt will perform LB dress with AE PRN with Min A OT Short Term Goal 4 (Week 2): Pt will perform ADL/task of choice for 10 minutes with no rest break to improve global endurance   Skilled Therapeutic Interventions/Progress Updates:    Pt greeted at time of session sitting up in wheelchair, no pain reported. Pt declined all ADL tasks, did not need to bathe/dress stating he had on clean clothes already. Verbally reviewed with the pt home accessibility and how to access his bathroom at home, since RW will not fit in straight recommended turning side ways. Pt states he will not be doing this and will use RW till door frame and use cane remaining distance to toilet as he always does at home. Recommended not to do this for safety, pt listening but unclear receptiveness.  Demonstrated how to use RW with sideways entry, no feedback noted. Pt declining to practice commode transfers or bathroom entry techniques. Pt declined out of room activity, standing, balance activities, etc but was agreeable to UB there ex. Retrieved 4# dowel and performed 1x15-20 of the following: bicep curl, chest press, overhead press, and FWD circles. Pt up in chair with alarm on call bell in reach. Declined further OT. Missed 35 minutes of OT.   Therapy Documentation Precautions:  Precautions Precautions: Fall, Other (comment) Precaution Comments: bilateral BKA Required Braces or Orthoses: Other Brace Other Brace: Bilateral prostheses Restrictions Weight Bearing Restrictions: No    Therapy/Group: Individual Therapy  Viona Gilmore 02/05/2021, 10:08 AM

## 2021-02-05 NOTE — Progress Notes (Signed)
Physical Therapy Session Note  Patient Details  Name: [name] MRN: [id number] Date of Birth: [birth year]  Today's Date: 02/05/2021 PT Individual Time: 0800-0901 PT Individual Time Calculation (min): 61 min   Short Term Goals: Week 1:  PT Short Term Goal 1 (Week 1): Pt will demonstrate bed mobility with consistent CGA. PT Short Term Goal 1 - Progress (Week 1): Met PT Short Term Goal 2 (Week 1): Pt will don BLE prosthetics with all necessary parts for proper fit with MinA. PT Short Term Goal 2 - Progress (Week 1): Met PT Short Term Goal 3 (Week 1): Pt will perform sit<>stand  and pivot transfers with consistent Min/ ModA PT Short Term Goal 3 - Progress (Week 1): Partly met PT Short Term Goal 4 (Week 1): Pt will initiate gait training. PT Short Term Goal 4 - Progress (Week 1): Progressing toward goal PT Short Term Goal 5 (Week 1): Pt will initiate w/c mobility and parts management training. PT Short Term Goal 5 - Progress (Week 1): Met  Skilled Therapeutic Interventions/Progress Updates:    pt received in bed and agreeable to therapy. Pt donned prosthetic liners and socks in bed with HOB elevated then transitioned to EOB with heavy use of bed rail. Discussed that pt will need to work on getting out of a flat bed prior to d/c. Donned prosthetics with min A to push RLE into place. Mina Sit to stand to RW from elevated bed. Stand pivot transfer to w/c with CGA, RW and incr time. Pt declined propelling w/c, stating the armrest rubs his fistula but agreeable to other UE work. UBE x 6 min for UE exercise to decr edema in LUE, with one ~45  sec break with discussion of energy conservation technique.   Pt ambulated x 200 ft with 2 rest breaks of ~ 5 minutes. Demoed hunched posture, slowed movements, and short step length. Required CGA for Sit to stand from w/c but did so very slowly. Therapist attempted to use rest breaks therapeutically, discussing energy conservation and home set up for d/c  planning, but pt became slightly agitated and stated, "stop asking questions." Attempted to educate pt that therapy staff are not trying to be nosy but help him be safe at d/c, but he was minimally receptive. Pt remained in w/c at end of session and was left with all needs in reach and alarm active, LUE elevated. Nephrology consult present on therapist's exit.   Therapy Documentation Precautions:  Precautions Precautions: Fall, Other (comment) Precaution Comments: bilateral BKA Required Braces or Orthoses: Other Brace Other Brace: Bilateral prostheses Restrictions Weight Bearing Restrictions: No     Therapy/Group: Individual Therapy  [name] 02/05/2021, 8:34 AM

## 2021-02-05 NOTE — Progress Notes (Signed)
[facility] NEPHROLOGY PROGRESS NOTE  Assessment/ Plan:   # Deconditioning/mobility deficits: Following hospitalization and small acute left pontine infarct.  Continues to report making improvements with PT/OT and tentative discharge date set for 12/7.  #  End-stage renal disease: From underlying progressive chronic kidney disease and exacerbated by cardiorenal syndrome-started on hemodialysis that he is now getting on TTS schedule (placed to chair at [facility]).  Status post left upper arm AV graft on 01/19/2021 with ipsilateral arm swelling possibly from venous hypertension versus postoperative edema being treated with ultrafiltration and arm elevation.  The swelling is gradually improving.  Plan for regular dialysis today.    # Anemia: Without overt blood loss.  Continue ESA, monitor hemoglobin.    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  #. Nutrition: Continue renal diet with ongoing renal multivitamin supplementation.  Continue nutritional supplementation as indicated by albumin levels.  # Hypertension: Blood pressure soft, not on antihypertensive.  Asymptomatic.  Monitor BP.    #Hyponatremia: Managed with dialysis, continue fluid restriction.  Subjective: Seen and examined.  No new event.  Denies nausea, vomiting, chest pain, shortness of breath. Objective Vital signs in last 24 hours: Vitals:   02/04/21 0514 02/04/21 1434 02/04/21 2033 02/05/21 0443  BP: (!) 96/50 (!) 100/41 (!) 99/53 (!) 106/49  Pulse: 71 68 71 67  Resp: 18 19 17 17   Temp: 99.6 F (37.6 C) 98.8 F (37.1 C) 99.3 F (37.4 C) 98.8 F (37.1 C)  TempSrc: Oral     SpO2: 96% 99% 99% 92%  Weight:      Height:       Weight change:   Intake/Output Summary (Last 24 hours) at 02/05/2021 1023 Last data filed at 02/05/2021 0806 Gross per 24 hour  Intake 360 ml  Output --  Net 360 ml        Labs: Basic Metabolic Panel: Recent Labs   Lab 01/31/21 0825 02/03/21 1100  NA 131* 131*  K 4.3 3.9  CL 93* 95*  CO2 20* 24  GLUCOSE 126* 106*  BUN 41* 38*  CREATININE 6.83* 6.72*  CALCIUM 7.8* 7.8*  PHOS 5.4* 4.3    Liver Function Tests: Recent Labs  Lab 01/31/21 0825 02/03/21 1100  ALBUMIN 2.7* 2.6*    No results for input(s): LIPASE, AMYLASE in the last 168 hours. No results for input(s): AMMONIA in the last 168 hours. CBC: Recent Labs  Lab 01/31/21 0825 02/03/21 1100  WBC 8.2 8.0  HGB 8.5* 8.6*  HCT 27.3* 26.5*  MCV 94.1 93.6  PLT 227 214    Cardiac Enzymes: No results for input(s): CKTOTAL, CKMB, CKMBINDEX, TROPONINI in the last 168 hours. CBG: Recent Labs  Lab 02/04/21 0702 02/04/21 1222 02/04/21 1658 02/04/21 2127 02/05/21 0544  GLUCAP 158* 121* 114* 113* 127*     Iron Studies: No results for input(s): IRON, TIBC, TRANSFERRIN, FERRITIN in the last 72 hours. Studies/Results: No results found.  Medications: Infusions:    Scheduled Medications:  (feeding supplement) PROSource Plus  30 mL Oral BID BM   amiodarone  200 mg Oral Daily   atorvastatin  80 mg Oral QHS   Chlorhexidine Gluconate Cloth  6 each Topical Q0600   Chlorhexidine Gluconate Cloth  6 each Topical Q0600   clopidogrel  75 mg Oral Daily   darbepoetin (ARANESP) injection - DIALYSIS  100 mcg Intravenous Q Thu-HD   feeding supplement (NEPRO CARB STEADY)  237 mL Oral BID BM  gabapentin  300 mg Oral QHS   insulin aspart  0-15 Units Subcutaneous TID WC   insulin aspart  4 Units Subcutaneous TID WC   mouth rinse  15 mL Mouth Rinse BID   multivitamin  1 tablet Oral QHS   pantoprazole  40 mg Oral Q0600   polyethylene glycol  17 g Oral Daily    have reviewed scheduled and prn medications.  Physical Exam: General:NAD, comfortable Heart:RRR, s1s2 nl Lungs:clear b/l, no crackle Abdomen:soft, Non-tender, non-distended Extremities: Bilateral below-knee amputation Dialysis Access: Right IJ TDC, left upper extremity with AV  fistula, edema seems to be improving.  Judge Duque Prasad Yanci Bachtell 02/05/2021,10:23 AM  LOS: 11 days

## 2021-02-05 NOTE — Progress Notes (Signed)
Occupational Therapy Session Note  Patient Details  Name: [name] MRN: [id number] Date of Birth: [birth year]  Today's Date: 02/06/2021 OT Individual Time: 1300-1344 OT Individual Time Calculation (min): 44 min   Skilled Therapeutic Interventions/Progress Updates:    Pt greeted in the w/c, agreeable to work on standing/dynamic balance. He changed the socks of his prosthetics given Min A. Sit<stand with RW completed with CGA and pt ambulated ~70 ft into the hallway and then back to his chair. Note that pt required a prolonged rest break post mobility. Sit<stand completed with CGA using pts SPC, per pt request. While standing pt reported "I'm not ready for this" in regards to transferring back to the bed. He sat back down in the w/c. He returned to bed via stand pivot using RW. Assisted pt back to bed, elevating residual limbs and Lt UE. He remained in bed at close of session, all needs within reach and bed alarm set.   Therapy Documentation Precautions:  Precautions Precautions: Fall, Other (comment) Precaution Comments: bilateral BKA Required Braces or Orthoses: Other Brace Other Brace: Bilateral prostheses Restrictions Weight Bearing Restrictions: No  Vital Signs: Therapy Vitals Temp: 99.1 F (37.3 C) Temp Source: Oral Pulse Rate: 76 Resp: 17 BP: (!) 141/119 Patient Position (if appropriate): Lying Oxygen Therapy SpO2: 99 % O2 Device: Room Air Pain: no c/o pain during tx   ADL: ADL Eating: Not assessed Grooming: Minimal assistance Where Assessed-Grooming: Edge of bed Upper Body Bathing: Supervision/safety Where Assessed-Upper Body Bathing: Edge of bed Lower Body Bathing: Minimal assistance Where Assessed-Lower Body Bathing: Bed level Upper Body Dressing: Moderate assistance Where Assessed-Upper Body Dressing: Edge of bed Lower Body Dressing: Not assessed Toileting: Not assessed Toilet Transfer: Not assessed Tub/Shower Transfer: Not assessed   Therapy/Group:  Individual Therapy  Divine Hansley A Ardith Lewman 02/06/2021, 3:51 PM

## 2021-02-05 NOTE — Progress Notes (Signed)
PROGRESS NOTE   Subjective/Complaints:  Pt reports he's working with therapy, in "spite of not wanting to". However is note from SLP yesterday that refused SLP completely.   LBM 11/28- says only goes 2x/week at home- if no BM by tomorrow, will give some sorbitol.   ROS:  Pt denies SOB, abd pain, CP, N/V/C/D, and vision changes  Objective:   No results found. Recent Labs    02/03/21 1100  WBC 8.0  HGB 8.6*  HCT 26.5*  PLT 214    Recent Labs    02/03/21 1100  NA 131*  K 3.9  CL 95*  CO2 24  GLUCOSE 106*  BUN 38*  CREATININE 6.72*  CALCIUM 7.8*     Intake/Output Summary (Last 24 hours) at 02/05/2021 0818 Last data filed at 02/05/2021 0806 Gross per 24 hour  Intake 360 ml  Output --  Net 360 ml        Physical Exam: Vital Signs Blood pressure (!) 106/49, pulse 67, temperature 98.8 F (37.1 C), resp. rate 17, height 5\' 8"  (1.727 m), weight 103.3 kg, SpO2 92 %.    General: awake, alert, appropriate, sitting up in bed; NAD HENT: conjugate gaze; oropharynx moist CV: regular rate; no JVD Pulmonary: CTA B/L; no W/R/R- good air movement GI: soft, NT, ND, (+)BS- hypoactive Psychiatric: appropriate but irritable; more awake;  Neurological: alert;   Musculoskeletal:     Cervical back: Normal range of motion and neck supple.     Comments: B/l BKA with mild tenderness, mild edema  Skin:    General: Skin is warm and dry.  Neurological:     Mental Status: He is alert.     Comments: Alert Dysarthria Motor: Grossly 4/5, left weaker than right   Assessment/Plan: 1. Functional deficits which require 3+ hours per day of interdisciplinary therapy in a comprehensive inpatient rehab setting. Physiatrist is providing close team supervision and 24 hour management of active medical problems listed below. Physiatrist and rehab team continue to assess barriers to discharge/monitor patient progress toward functional  and medical goals  Care Tool:  Bathing    Body parts bathed by patient: Chest, Abdomen, Face, Right arm, Left arm, Left upper leg, Right upper leg   Body parts bathed by helper: Buttocks Body parts n/a: Right lower leg, Left lower leg   Bathing assist Assist Level: Moderate Assistance - Patient 50 - 74%     Upper Body Dressing/Undressing Upper body dressing   What is the patient wearing?: Pull over shirt    Upper body assist Assist Level: Minimal Assistance - Patient > 75%    Lower Body Dressing/Undressing Lower body dressing    Lower body dressing activity did not occur: Refused What is the patient wearing?: Pants     Lower body assist Assist for lower body dressing: Moderate Assistance - Patient 50 - 74%     Toileting Toileting Toileting Activity did not occur (Clothing management and hygiene only): N/A (no void or bm)  Toileting assist       Transfers Chair/bed transfer  Transfers assist     Chair/bed transfer assist level: Minimal Assistance - Patient > 75%     Locomotion Ambulation  Ambulation assist   Ambulation activity did not occur: Safety/medical concerns  Assist level: Minimal Assistance - Patient > 75% Assistive device: Walker-rolling Max distance: 100 ft   Walk 10 feet activity   Assist  Walk 10 feet activity did not occur: Safety/medical concerns  Assist level: Minimal Assistance - Patient > 75% Assistive device: Walker-rolling   Walk 50 feet activity   Assist Walk 50 feet with 2 turns activity did not occur: Safety/medical concerns  Assist level: Minimal Assistance - Patient > 75% Assistive device: Walker-rolling    Walk 150 feet activity   Assist Walk 150 feet activity did not occur: Safety/medical concerns         Walk 10 feet on uneven surface  activity   Assist Walk 10 feet on uneven surfaces activity did not occur: Safety/medical concerns         Wheelchair     Assist Is the patient using a  wheelchair?: Yes Type of Wheelchair: Manual    Wheelchair assist level: Total Assistance - Patient < 25% Max wheelchair distance: 30 ft    Wheelchair 50 feet with 2 turns activity    Assist        Assist Level: Moderate Assistance - Patient 50 - 74%   Wheelchair 150 feet activity     Assist      Assist Level: Total Assistance - Patient < 25%   Blood pressure (!) 106/49, pulse 67, temperature 98.8 F (37.1 C), resp. rate 17, height 5\' 8"  (1.727 m), weight 103.3 kg, SpO2 92 %.  Medical Problem List and Plan: 1.  Deficits with mobility, self-care, transfers, endurance secondary to L pontine stroke              -patient may not shower due to HD catheter             -ELOS/Goals: 10-14 days/Min A            Continue CIR- PT, OT and SLP  Set d/c- date as 12/7- cannot go to SNF due to insurance, since came here- biggest limitation is pt's cognition/unsafe behavior- refusing Slp regularly- con't PT/OT/SLP  Con't PT/OT and SLP= refusing Slp regularly.  2.  Antithrombotics: -DVT/anticoagulation:  Mechanical: Sequential compression devices, below knee Bilateral lower extremities             -antiplatelet therapy: DAPT X 3 weeks followed by Plavix alone  11/25- changing to Plavix alone as of tomorrow 3. Pain Management: Tylenol prn.  4. Mood: LCSW to follow for evaluation and support.              -antipsychotic agents: N/A 5. Neuropsych: This patient is ?fully capable of making decisions on his own behalf. 6. Skin/Wound Care: Routine pressure relief measures.  7. Fluids/Electrolytes/Nutrition: Strict I/O. 1200 cc FR. Renal diet' 8. 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 Demand ischemia/NSVT: On amiodarone daily 10. Thrombocytopenia: Platelets recovering from drop to 76-->108  11/21- Plts 208 today- con't to monitor 11. Anemia  of chronic disease: ON Aranesp weekly for supplement.              CBC ordered for tomorrow  11/21- Hb a little decreased at 7.5- will recheck per renal   11/29- Last Hb 8.5- stable 12. T2DM with hyperglycemia:  Hgb  A1C-6.8.  Monitor BS ac/hs.  --Continue SSI as well as 4 units novolog TID with meals 11/29- BG's 87 to 161 and 1 value of 237- con't regimen 12/1- CBG's controlled- con't regimen Monitor with increased mobility 13. Small acute left pontine infarct             See #2 14. S/p b/l BKA- chronic             Continue to monitor- pt states he was amb with Bilateral prosthetics  15. Impaired cognition- mild to moderate with impulsiveness and poor safety awareness- will con't SLP and therapies, however this is pt's biggest limitation.  16. Constipation  11/24- will give Sorbitol 60cc at 11am and soap suds enema at 3-4 pm- so has time to work.    11/25- pt refused Enema and then took another dose of sorbitol, so 84ml yesterday- so is now cleaned out.   11/28- 2 BM's last night/overnight- incontinent- bowel meds prn.  12/1- LBM x2 on 11/28- if no BM by tomorrow, will need Sorbitol. Refuses bowel meds, but will try to give Miralax daily to start today.  17/ Sleepiness/sedation Alert this am , BMET without changes compared to 4 d earlier    LOS: 11 days A FACE TO FACE EVALUATION WAS PERFORMED  Annaya Bangert 02/05/2021, 8:18 AM

## 2021-02-06 ENCOUNTER — Inpatient Hospital Stay (HOSPITAL_COMMUNITY): Payer: Medicare Other

## 2021-02-06 LAB — GLUCOSE, CAPILLARY
Glucose-Capillary: 244 mg/dL — ABNORMAL HIGH (ref 70–99)
Glucose-Capillary: 169 mg/dL — ABNORMAL HIGH (ref 70–99)
Glucose-Capillary: 123 mg/dL — ABNORMAL HIGH (ref 70–99)
Glucose-Capillary: 114 mg/dL — ABNORMAL HIGH (ref 70–99)

## 2021-02-06 MED ORDER — CHLORHEXIDINE GLUCONATE CLOTH 2 % EX PADS
6.0000 | MEDICATED_PAD | Freq: Every day | CUTANEOUS | Status: DC
  Administered 2021-02-08 – 2021-02-11 (×4): 6 via TOPICAL

## 2021-02-06 MED ORDER — SORBITOL 70 % SOLN
30.0000 mL | Freq: Once | Status: AC
  Administered 2021-02-06: 30 mL via ORAL
  Filled 2021-02-06: qty 30

## 2021-02-06 MED FILL — Medication: Qty: 1 | Status: AC

## 2021-02-06 NOTE — Plan of Care (Signed)
  Problem: Consults Goal: RH STROKE PATIENT EDUCATION Description: See Patient Education module for education specifics  Outcome: Progressing   Problem: RH BOWEL ELIMINATION Goal: RH STG MANAGE BOWEL WITH ASSISTANCE Description: STG Manage Bowel with  mod I Assistance. Outcome: Progressing Goal: RH STG MANAGE BOWEL W/MEDICATION W/ASSISTANCE Description: STG Manage Bowel with Medication with  mod I Assistance. Outcome: Progressing   Problem: RH SAFETY Goal: RH STG ADHERE TO SAFETY PRECAUTIONS W/ASSISTANCE/DEVICE Description: STG Adhere to Safety Precautions With cues Assistance/Device. Outcome: Progressing   Problem: RH KNOWLEDGE DEFICIT Goal: RH STG INCREASE KNOWLEDGE OF DIABETES Description: Patient will be able to manage DM with medications and dietary modifications using handouts and educational resources independently Outcome: Progressing Goal: RH STG INCREASE KNOWLEDGE OF HYPERTENSION Description: Patient will be able to manage HTN with medications and dietary modifications using handouts and educational resources independently Outcome: Progressing Goal: RH STG INCREASE KNOWLEDGE OF DYSPHAGIA/FLUID INTAKE Description: Patient will be able to manage Dysphagia, medications and dietary modifications using handouts and educational resources independently Outcome: Progressing Goal: RH STG INCREASE KNOWLEGDE OF HYPERLIPIDEMIA Description: Patient will be able to manage HLD with medications and dietary modifications using handouts and educational resources independently Outcome: Progressing Goal: RH STG INCREASE KNOWLEDGE OF STROKE PROPHYLAXIS Description: Patient will be able to manage secondary risks with medications and dietary modifications using handouts and educational resources independently Outcome: Progressing

## 2021-02-06 NOTE — Progress Notes (Signed)
Occupational Therapy Session Note  Patient Details  Name: [name] MRN: [id number] Date of Birth: Jul 12, 1950  Today's Date: 02/06/2021 OT Group Time: 1100-1200 OT Group Time Calculation (min): 60 min   Short Term Goals: Week 2:  OT Short Term Goal 1 (Week 2): Pt will perform toilet/BSC transfer with CGA and LRAD OT Short Term Goal 2 (Week 2): Pt will don B prosthetics with Supervision OT Short Term Goal 3 (Week 2): Pt will perform LB dress with AE PRN with Min A OT Short Term Goal 4 (Week 2): Pt will perform ADL/task of choice for 10 minutes with no rest break to improve global endurance  Skilled Therapeutic Interventions/Progress Updates:  Pt participated in group session with a focus on stress mgmt, education on healthy coping strategies, and social interaction. Focus of session on providing coping strategies to manage new current level of function as a result of new diagnosis.  Session focus on breaking down stressors into "daily hassles," "major life stressors" and "life circumstances" in an effort to allow pts to chunk their stressors into groups. Pt very outgoing, very talkative and actively sharing stressors and contributing to group conversation.pt reports his motto is to always stay positive and reports heavily relying on his faith.  Provided active listening, emotional support and therapeutic use of self. Offered education on factors that protect [place] against stress such as "daily uplifts," "healthy coping strategies" and "protective factors." Encouraged all group members to make an effort to actively recall one event from their day that was a daily uplift in an effort to protect their mindset from stressors as well as sharing this information with their caregivers to facilitate improved caregiver communication and decrease overall burden of care.  Issued pt handouts on healthy coping strategies to implement into routine. Pt transported back to room by this OTA with pt left seated in w/c  with alarm belt activated and all needs within reach.  Therapy Documentation\ Precautions:  Precautions Precautions: Fall, Other (comment) Precaution Comments: bilateral BKA Required Braces or Orthoses: Other Brace Other Brace: Bilateral prostheses Restrictions Weight Bearing Restrictions: No   Pain: No pain reported during session    Therapy/Group: Group Therapy  [name] 02/06/2021, 12:53 PM

## 2021-02-06 NOTE — Progress Notes (Signed)
[name] NEPHROLOGY PROGRESS NOTE  Assessment/ Plan:   # Deconditioning/mobility deficits: Following hospitalization and small acute left pontine infarct.  Continues to report making improvements with PT/OT and tentative discharge date set for 12/7.  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  Status post left upper arm AV graft on 01/19/2021 with ipsilateral arm swelling possibly from venous hypertension versus postoperative edema being treated with ultrafiltration and arm elevation.  The swelling is gradually improving.  Status post HD yesterday with 3.5 L ultrafiltration, tolerated well.  Plan for next HD tomorrow.  # Anemia: Without overt blood loss.  Continue ESA, monitor hemoglobin.    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  #. Nutrition: Continue renal diet with ongoing renal multivitamin supplementation.  Continue nutritional supplementation as indicated by albumin levels.  # Hypertension: Blood pressure soft, not on antihypertensive.  Asymptomatic.  Monitor BP.    #Hyponatremia: Managed with dialysis, continue fluid restriction.  #Mild febrile today: The catheter site looks clean.  Clinically asymptomatic.  Defer to primary team.  Subjective: Seen and examined.  Sitting on chair comfortable and denies any complaint.  No nausea,, vomiting, chest pain, shortness of breath, headache or dizziness.  Objective Vital signs in last 24 hours: Vitals:   02/05/21 1832 02/05/21 1907 02/05/21 1922 02/06/21 0549  BP: (!) 117/55 (!) 108/51 110/66 (!) 117/56  Pulse: 70 73 72 73  Resp: 20 16 16 17   Temp:  98.9 F (37.2 C) 98.7 F (37.1 C) (!) 100.7 F (38.2 C)  TempSrc:    Oral  SpO2:  98% 97% 98%  Weight:    102.7 kg  Height:       Weight  change:   Intake/Output Summary (Last 24 hours) at 02/06/2021 1153 Last data filed at 02/06/2021 0802 Gross per 24 hour  Intake 720 ml  Output 3500 ml  Net -2780 ml        Labs: Basic Metabolic Panel: Recent Labs  Lab 01/31/21 0825 02/03/21 1100 02/05/21 1211  NA 131* 131* 129*  K 4.3 3.9 4.2  CL 93* 95* 90*  CO2 20* 24 27  GLUCOSE 126* 106* 157*  BUN 41* 38* 36*  CREATININE 6.83* 6.72* 5.74*  CALCIUM 7.8* 7.8* 7.6*  PHOS 5.4* 4.3 4.3    Liver Function Tests: Recent Labs  Lab 01/31/21 0825 02/03/21 1100 02/05/21 1211  ALBUMIN 2.7* 2.6* 2.6*    No results for input(s): LIPASE, AMYLASE in the last 168 hours. No results for input(s): AMMONIA in the last 168 hours. CBC: Recent Labs  Lab 01/31/21 0825 02/03/21 1100 02/05/21 1211  WBC 8.2 8.0 7.0  HGB 8.5* 8.6* 8.3*  HCT 27.3* 26.5* 25.9*  MCV 94.1 93.6 92.2  PLT 227 214 207    Cardiac Enzymes: No results for input(s): CKTOTAL, CKMB, CKMBINDEX, TROPONINI in the last 168 hours. CBG: Recent Labs  Lab 02/04/21 2127 02/05/21 0544 02/05/21 1143 02/05/21 2036 02/06/21 0551  GLUCAP 113* 127* 147* 130* 244*     Iron Studies: No results for input(s): IRON, TIBC, TRANSFERRIN, FERRITIN in the last 72 hours. Studies/Results: No results found.  Medications: Infusions:    Scheduled Medications:  (feeding supplement) PROSource Plus  30 mL Oral BID BM   amiodarone  200 mg Oral Daily   atorvastatin  80 mg Oral  QHS   Chlorhexidine Gluconate Cloth  6 each Topical Q0600   Chlorhexidine Gluconate Cloth  6 each Topical Q0600   clopidogrel  75 mg Oral Daily   darbepoetin (ARANESP) injection - DIALYSIS  100 mcg Intravenous Q Thu-HD   feeding supplement (NEPRO CARB STEADY)  237 mL Oral BID BM   gabapentin  300 mg Oral QHS   insulin aspart  0-15 Units Subcutaneous TID WC   insulin aspart  4 Units Subcutaneous TID WC   mouth rinse  15 mL Mouth Rinse BID   multivitamin  1 tablet Oral QHS   pantoprazole  40 mg  Oral Q0600   polyethylene glycol  17 g Oral Daily   sorbitol  30 mL Oral Once    have reviewed scheduled and prn medications.  Physical Exam: General: Sitting on chair comfortable, not in distress Heart:RRR, s1s2 nl Lungs: Clear b/l, no crackle Abdomen:soft, Non-tender, non-distended Extremities: Bilateral below-knee amputation Dialysis Access: Right IJ TDC, left upper extremity with AV fistula, the edema is gradually improving.  [name] [name] 02/06/2021,11:53 AM  LOS: 12 days

## 2021-02-06 NOTE — Progress Notes (Signed)
Physical Therapy Session Note  Patient Details  Name: [name] MRN: [id number] Date of Birth: [birth year]  Today's Date: 02/06/2021 PT Individual Time: 0929-0958 PT Individual Time Calculation (min): 29 min   Short Term Goals: Week 1:  PT Short Term Goal 1 (Week 1): Pt will demonstrate bed mobility with consistent CGA. PT Short Term Goal 1 - Progress (Week 1): Met PT Short Term Goal 2 (Week 1): Pt will don BLE prosthetics with all necessary parts for proper fit with MinA. PT Short Term Goal 2 - Progress (Week 1): Met PT Short Term Goal 3 (Week 1): Pt will perform sit<>stand  and pivot transfers with consistent Min/ ModA PT Short Term Goal 3 - Progress (Week 1): Partly met PT Short Term Goal 4 (Week 1): Pt will initiate gait training. PT Short Term Goal 4 - Progress (Week 1): Progressing toward goal PT Short Term Goal 5 (Week 1): Pt will initiate w/c mobility and parts management training. PT Short Term Goal 5 - Progress (Week 1): Met  Skilled Therapeutic Interventions/Progress Updates:     pt received in bed and agreeable to therapy. Pt transitioned to EOB with HOB elevated and bed rail. Donned liners and socks in semi reclined position, donned prosthesis with min A to push on. CGA Stand pivot transfer to w/c with min A to stand. Pt agreeable to gait practice, but had disagreeable affect, making statements like: "what else am I gonna be bothered with?" And "don't rush me" when therapist asked if he was ready for first bout before doing any other work. Pt ambulated x 130 ft and x 100 ft with extended rest breaks. No attempt at education during breaks so as not to further aggravate pt and maximize participation. VC for upright posture with min improvement. Pt left in ortho gym for group session at end of scheduled PT.  Pt missed 31 minutes at start of session, therapist was delayed with previous pt care. Will follow up to make up minutes as able.    Therapy Documentation Precautions:   Precautions Precautions: Fall, Other (comment) Precaution Comments: bilateral BKA Required Braces or Orthoses: Other Brace Other Brace: Bilateral prostheses Restrictions Weight Bearing Restrictions: No General: PT Amount of Missed Time (min): 31 Minutes PT Missed Treatment Reason: Other (Comment) (previous pt care)     Therapy/Group: Individual Therapy  [name] 02/06/2021, 2:58 PM

## 2021-02-06 NOTE — Progress Notes (Signed)
PROGRESS NOTE   Subjective/Complaints:  Pt reports "doesn't poop like other people"- and doesn't want intervention for LBM 4+ days ago- explained that he agreed to go 2x/week and usually did at home- will order Sorbitol.   Also upset that doesn't have coffee this AM. Also wants grits.    Denies feeling ill.  ROS:  Pt denies SOB, abd pain, CP, N/V/C/D, and vision changes   Objective:   No results found. Recent Labs    02/03/21 1100 02/05/21 1211  WBC 8.0 7.0  HGB 8.6* 8.3*  HCT 26.5* 25.9*  PLT 214 207    Recent Labs    02/03/21 1100 02/05/21 1211  NA 131* 129*  K 3.9 4.2  CL 95* 90*  CO2 24 27  GLUCOSE 106* 157*  BUN 38* 36*  CREATININE 6.72* 5.74*  CALCIUM 7.8* 7.6*     Intake/Output Summary (Last 24 hours) at 02/06/2021 1610 Last data filed at 02/06/2021 0802 Gross per 24 hour  Intake 720 ml  Output 3500 ml  Net -2780 ml        Physical Exam: Vital Signs Blood pressure (!) 117/56, pulse 73, temperature (!) 100.7 F (38.2 C), temperature source Oral, resp. rate 17, height 5\' 8"  (1.727 m), weight 102.7 kg, SpO2 98 %.   Tm- 100.7- however no fever seen for days-    General: awake, alert, appropriate, NAD HENT: conjugate gaze; oropharynx moist CV: regular rate; no JVD Pulmonary: CTA B/L; no W/R/R- good air movement GI: soft, NT, ND, (+)BS- protuberant; hypoactive Psychiatric: irritable about lack of coffee Neurological: alert;   Musculoskeletal:     Cervical back: Normal range of motion and neck supple.     Comments: B/l BKA with mild tenderness, mild edema  Skin:    General: Skin is warm and dry.  Neurological:     Mental Status: He is alert.     Comments: Alert Dysarthria Motor: Grossly 4/5, left weaker than right   Assessment/Plan: 1. Functional deficits which require 3+ hours per day of interdisciplinary therapy in a comprehensive inpatient rehab setting. Physiatrist is  providing close team supervision and 24 hour management of active medical problems listed below. Physiatrist and rehab team continue to assess barriers to discharge/monitor patient progress toward functional and medical goals  Care Tool:  Bathing    Body parts bathed by patient: Chest, Abdomen, Face, Right arm, Left arm, Left upper leg, Right upper leg   Body parts bathed by helper: Buttocks Body parts n/a: Right lower leg, Left lower leg   Bathing assist Assist Level: Moderate Assistance - Patient 50 - 74%     Upper Body Dressing/Undressing Upper body dressing   What is the patient wearing?: Pull over shirt    Upper body assist Assist Level: Minimal Assistance - Patient > 75%    Lower Body Dressing/Undressing Lower body dressing    Lower body dressing activity did not occur: Refused What is the patient wearing?: Pants     Lower body assist Assist for lower body dressing: Moderate Assistance - Patient 50 - 74%     Toileting Toileting Toileting Activity did not occur (Clothing management and hygiene only): N/A (no void or  bm)  Toileting assist       Transfers Chair/bed transfer  Transfers assist     Chair/bed transfer assist level: Minimal Assistance - Patient > 75%     Locomotion Ambulation   Ambulation assist   Ambulation activity did not occur: Safety/medical concerns  Assist level: Minimal Assistance - Patient > 75% Assistive device: Walker-rolling Max distance: 100 ft   Walk 10 feet activity   Assist  Walk 10 feet activity did not occur: Safety/medical concerns  Assist level: Minimal Assistance - Patient > 75% Assistive device: Walker-rolling   Walk 50 feet activity   Assist Walk 50 feet with 2 turns activity did not occur: Safety/medical concerns  Assist level: Minimal Assistance - Patient > 75% Assistive device: Walker-rolling    Walk 150 feet activity   Assist Walk 150 feet activity did not occur: Safety/medical concerns          Walk 10 feet on uneven surface  activity   Assist Walk 10 feet on uneven surfaces activity did not occur: Safety/medical concerns         Wheelchair     Assist Is the patient using a wheelchair?: Yes Type of Wheelchair: Manual    Wheelchair assist level: Total Assistance - Patient < 25% Max wheelchair distance: 30 ft    Wheelchair 50 feet with 2 turns activity    Assist        Assist Level: Moderate Assistance - Patient 50 - 74%   Wheelchair 150 feet activity     Assist      Assist Level: Total Assistance - Patient < 25%   Blood pressure (!) 117/56, pulse 73, temperature (!) 100.7 F (38.2 C), temperature source Oral, resp. rate 17, height 5\' 8"  (1.727 m), weight 102.7 kg, SpO2 98 %.  Medical Problem List and Plan: 1.  Deficits with mobility, self-care, transfers, endurance secondary to L pontine stroke              -patient may not shower due to HD catheter             -ELOS/Goals: 10-14 days/Min A            Continue CIR- PT, OT and SLP  Set d/c- date as 12/7- cannot go to SNF due to insurance, since came here- biggest limitation is pt's cognition/unsafe behavior- refusing Slp regularly- con't PT/OT/SLP  Continue CIR- PT, OT and SLP- refusing SLP frequently.  2.  Antithrombotics: -DVT/anticoagulation:  Mechanical: Sequential compression devices, below knee Bilateral lower extremities  12/2- refuses SQ Heparin.              -antiplatelet therapy: DAPT X 3 weeks followed by Plavix alone  11/25- changing to Plavix alone as of tomorrow 3. Pain Management: Tylenol prn.  4. Mood: LCSW to follow for evaluation and support.              -antipsychotic agents: N/A 5. Neuropsych: This patient is ?fully capable of making decisions on his own behalf. 6. Skin/Wound Care: Routine pressure relief measures.  7. Fluids/Electrolytes/Nutrition: Strict I/O. 1200 cc FR. Renal diet' 8. New ESRD due to cardiorenal syndrome: On HD TTS --schedule at the end of the day  to help with tolerance of therapy             --Daily weight  AVG LUE 01/19/2021 with post op swelling   12/2-  9. Demand ischemia/NSVT: On amiodarone daily 10. Thrombocytopenia: Platelets recovering from drop to 76-->108  11/21- Plts 208 today-  con't to monitor 11. Anemia of chronic disease: ON Aranesp weekly for supplement.              CBC ordered for tomorrow  11/21- Hb a little decreased at 7.5- will recheck per renal   11/29- Last Hb 8.5- stable 12. T2DM with hyperglycemia: Hgb  A1C-6.8.  Monitor BS ac/hs.  --Continue SSI as well as 4 units novolog TID with meals 11/29- BG's 87 to 161 and 1 value of 237- con't regimen 12/1- CBG's controlled- con't regimen Monitor with increased mobility 13. Small acute left pontine infarct             See #2 14. S/p b/l BKA- chronic             Continue to monitor- pt states he was amb with Bilateral prosthetics  15. Impaired cognition- mild to moderate with impulsiveness and poor safety awareness- will con't SLP and therapies, however this is pt's biggest limitation.  16. Constipation  11/24- will give Sorbitol 60cc at 11am and soap suds enema at 3-4 pm- so has time to work.    11/25- pt refused Enema and then took another dose of sorbitol, so 98ml yesterday- so is now cleaned out.   11/28- 2 BM's last night/overnight- incontinent- bowel meds prn.  12/1- LBM x2 on 11/28- if no BM by tomorrow, will need Sorbitol. Refuses bowel meds, but will try to give Miralax daily to start today.   12/2- no BM yet- ordered Sorbitol 30cc for bwoels 17/ Sleepiness/sedation Alert this am , BMET without changes compared to 4 d earlier 56. Fever- low grade 12/2- Tm- 100.7- will check CXR- might need U/A checked- only voids q2-3 days- WBC 7.0k yesterday afternoon.    LOS: 12 days A FACE TO FACE EVALUATION WAS PERFORMED  [name] 02/06/2021, 8:22 AM

## 2021-02-06 NOTE — Progress Notes (Signed)
Speech Language Pathology Daily Session Note  Patient Details  Name: [name] MRN: [id number] Date of Birth: 1950-07-04  Today's Date: 02/06/2021 SLP Individual Time: 1400-1425 SLP Individual Time Calculation (min): 25 min  Short Term Goals: Week 2: SLP Short Term Goal 1 (Week 2): STG = LTG d/t ELOS  Skilled Therapeutic Interventions:   Patient seen for skilled ST session focusing on cognitive function goals. Patient was in bed when SLP arrived and was minimally interactive. He gave short responses to SLP's questions and would not elaborate when requested. He was unable to state how close he felt he was to achieving goals before discharge date but that he hoped to be strong enough to not need help at home. He stated he would not know what type of help he would potentially need "until I get home." He was oriented to month, date, day of week, stated year as "2023" without awareness to error. Patient appears apathetic towards session overall and has previously stated he gets irritated when asked questions. Patient with anticipated discharge date of 12/7 and with ST services potentially discharging prior to this.  Pain Pain Assessment Pain Scale: 0-10 Pain Score: 0-No pain  Therapy/Group: Individual Therapy  Sonia Baller, MA, CCC-SLP Speech Therapy

## 2021-02-07 DIAGNOSIS — R5381 Other malaise: Secondary | ICD-10-CM

## 2021-02-07 DIAGNOSIS — N186 End stage renal disease: Secondary | ICD-10-CM

## 2021-02-07 DIAGNOSIS — R7309 Other abnormal glucose: Secondary | ICD-10-CM

## 2021-02-07 DIAGNOSIS — Z992 Dependence on renal dialysis: Secondary | ICD-10-CM

## 2021-02-07 DIAGNOSIS — K5901 Slow transit constipation: Secondary | ICD-10-CM

## 2021-02-07 DIAGNOSIS — R509 Fever, unspecified: Secondary | ICD-10-CM

## 2021-02-07 LAB — RENAL FUNCTION PANEL
Albumin: 2.4 g/dL — ABNORMAL LOW (ref 3.5–5.0)
Anion gap: 10 (ref 5–15)
BUN: 30 mg/dL — ABNORMAL HIGH (ref 8–23)
CO2: 26 mmol/L (ref 22–32)
Calcium: 7.8 mg/dL — ABNORMAL LOW (ref 8.9–10.3)
Chloride: 92 mmol/L — ABNORMAL LOW (ref 98–111)
Creatinine, Ser: 4.78 mg/dL — ABNORMAL HIGH (ref 0.61–1.24)
GFR, Estimated: 12 mL/min — ABNORMAL LOW (ref >60)
Glucose, Bld: 131 mg/dL — ABNORMAL HIGH (ref 70–99)
Phosphorus: 3.9 mg/dL (ref 2.5–4.6)
Potassium: 3.9 mmol/L (ref 3.5–5.1)
Sodium: 128 mmol/L — ABNORMAL LOW (ref 135–145)

## 2021-02-07 LAB — CBC
HCT: 24.3 % — ABNORMAL LOW (ref 39.0–52.0)
Hemoglobin: 7.8 g/dL — ABNORMAL LOW (ref 13.0–17.0)
MCH: 29.7 pg (ref 26.0–34.0)
MCHC: 32.1 g/dL (ref 30.0–36.0)
MCV: 92.4 fL (ref 80.0–100.0)
Platelets: 195 10*3/uL (ref 150–400)
RBC: 2.63 MIL/uL — ABNORMAL LOW (ref 4.22–5.81)
RDW: 18.2 % — ABNORMAL HIGH (ref 11.5–15.5)
WBC: 5.9 10*3/uL (ref 4.0–10.5)
nRBC: 0 % (ref 0.0–0.2)

## 2021-02-07 LAB — HEPATITIS B SURFACE ANTIBODY,QUALITATIVE: Hep B S Ab: NONREACTIVE

## 2021-02-07 LAB — HEPATITIS B SURFACE ANTIGEN: Hepatitis B Surface Ag: NONREACTIVE

## 2021-02-07 LAB — GLUCOSE, CAPILLARY
Glucose-Capillary: 208 mg/dL — ABNORMAL HIGH (ref 70–99)
Glucose-Capillary: 126 mg/dL — ABNORMAL HIGH (ref 70–99)
Glucose-Capillary: 94 mg/dL (ref 70–99)
Glucose-Capillary: 164 mg/dL — ABNORMAL HIGH (ref 70–99)

## 2021-02-07 MED ORDER — PENTAFLUOROPROP-TETRAFLUOROETH EX AERO: 1.0000 "application " | INHALATION_SPRAY | CUTANEOUS | Status: DC | PRN

## 2021-02-07 MED ORDER — POLYETHYLENE GLYCOL 3350 17 G PO PACK
17.0000 g | PACK | Freq: Two times a day (BID) | ORAL | Status: DC
  Administered 2021-02-07 – 2021-02-09 (×3): 17 g via ORAL
  Filled 2021-02-07 (×7): qty 1

## 2021-02-07 MED ORDER — HEPARIN SODIUM (PORCINE) 1000 UNIT/ML DIALYSIS
1000.0000 [IU] | INTRAMUSCULAR | Status: DC | PRN
  Filled 2021-02-07: qty 1

## 2021-02-07 MED ORDER — LIDOCAINE-PRILOCAINE 2.5-2.5 % EX CREA: 1.0000 "application " | TOPICAL_CREAM | CUTANEOUS | Status: DC | PRN

## 2021-02-07 MED ORDER — SODIUM CHLORIDE 0.9 % IV SOLN: 100.0000 mL | INTRAVENOUS | Status: DC | PRN

## 2021-02-07 MED ORDER — HEPARIN SODIUM (PORCINE) 1000 UNIT/ML DIALYSIS
20.0000 [IU]/kg | INTRAMUSCULAR | Status: DC | PRN
  Filled 2021-02-07: qty 3

## 2021-02-07 MED ORDER — ALTEPLASE 2 MG IJ SOLR
2.0000 mg | Freq: Once | INTRAMUSCULAR | Status: DC | PRN
  Filled 2021-02-07: qty 2

## 2021-02-07 MED ORDER — LIDOCAINE HCL (PF) 1 % IJ SOLN: 5.0000 mL | INTRAMUSCULAR | Status: DC | PRN

## 2021-02-07 NOTE — Plan of Care (Signed)
  Problem: Consults Goal: RH STROKE PATIENT EDUCATION Description: See Patient Education module for education specifics  Outcome: Progressing   Problem: RH BOWEL ELIMINATION Goal: RH STG MANAGE BOWEL WITH ASSISTANCE Description: STG Manage Bowel with  mod I Assistance. Outcome: Progressing Goal: RH STG MANAGE BOWEL W/MEDICATION W/ASSISTANCE Description: STG Manage Bowel with Medication with  mod I Assistance. Outcome: Progressing   Problem: RH SAFETY Goal: RH STG ADHERE TO SAFETY PRECAUTIONS W/ASSISTANCE/DEVICE Description: STG Adhere to Safety Precautions With cues Assistance/Device. Outcome: Progressing   Problem: RH KNOWLEDGE DEFICIT Goal: RH STG INCREASE KNOWLEDGE OF DIABETES Description: Patient will be able to manage DM with medications and dietary modifications using handouts and educational resources independently Outcome: Progressing Goal: RH STG INCREASE KNOWLEDGE OF HYPERTENSION Description: Patient will be able to manage HTN with medications and dietary modifications using handouts and educational resources independently Outcome: Progressing Goal: RH STG INCREASE KNOWLEDGE OF DYSPHAGIA/FLUID INTAKE Description: Patient will be able to manage Dysphagia, medications and dietary modifications using handouts and educational resources independently Outcome: Progressing Goal: RH STG INCREASE KNOWLEGDE OF HYPERLIPIDEMIA Description: Patient will be able to manage HLD with medications and dietary modifications using handouts and educational resources independently Outcome: Progressing Goal: RH STG INCREASE KNOWLEDGE OF STROKE PROPHYLAXIS Description: Patient will be able to manage secondary risks with medications and dietary modifications using handouts and educational resources independently Outcome: Progressing

## 2021-02-07 NOTE — Progress Notes (Signed)
PROGRESS NOTE   Subjective/Complaints: Patient seen sitting up in bed this morning.  He states he slept well overnight.  He notes constipation and he is amenable to medications.  He no longer requires supplemental O2.  He was seen by nephrology this a.m., notes reviewed-labs ordered.  ROS: Denies CP, SOB, N/V/D  Objective:   DG CHEST PORT 1 VIEW  Result Date: 02/06/2021 CLINICAL DATA:  Fever, on dialysis EXAM: PORTABLE CHEST 1 VIEW COMPARISON:  01/22/2021 FINDINGS: Right dialysis catheter is unchanged. Persistent linear atelectasis/scarring mid right lung. Probable minimal atelectasis right lung base. No pleural effusion. Stable cardiomegaly. IMPRESSION: No acute process in the chest. Electronically Signed   By: Macy Mis M.D.   On: 02/06/2021 12:45   Recent Labs    02/05/21 1211  WBC 7.0  HGB 8.3*  HCT 25.9*  PLT 207     Recent Labs    02/05/21 1211  NA 129*  K 4.2  CL 90*  CO2 27  GLUCOSE 157*  BUN 36*  CREATININE 5.74*  CALCIUM 7.6*      Intake/Output Summary (Last 24 hours) at 02/07/2021 1030 Last data filed at 02/06/2021 1833 Gross per 24 hour  Intake 234 ml  Output --  Net 234 ml         Physical Exam: Vital Signs Blood pressure (!) 96/51, pulse 74, temperature 98.9 F (37.2 C), temperature source Oral, resp. rate 16, height 5\' 8"  (1.727 m), weight 102.7 kg, SpO2 98 %. Constitutional: No distress . Vital signs reviewed. HENT: Normocephalic.  Atraumatic. Eyes: EOMI. No discharge. Cardiovascular: No JVD.  RRR. Respiratory: Normal effort.  No stridor.  Bilateral clear to auscultation. GI: Non-distended.  BS +. Skin: Warm and dry.  Intact. Psych: Somewhat flat.  Normal behavior. Musc: Bilateral BKA with mild tenderness and mild edema Neuro: Alert Dysarthria, stable Motor: Grossly 4/5, left weaker than right, unchanged  Assessment/Plan: 1. Functional deficits which require 3+ hours per day  of interdisciplinary therapy in a comprehensive inpatient rehab setting. Physiatrist is providing close team supervision and 24 hour management of active medical problems listed below. Physiatrist and rehab team continue to assess barriers to discharge/monitor patient progress toward functional and medical goals  Care Tool:  Bathing    Body parts bathed by patient: Chest, Abdomen, Face, Right arm, Left arm, Left upper leg, Right upper leg   Body parts bathed by helper: Buttocks Body parts n/a: Right lower leg, Left lower leg   Bathing assist Assist Level: Moderate Assistance - Patient 50 - 74%     Upper Body Dressing/Undressing Upper body dressing   What is the patient wearing?: Pull over shirt    Upper body assist Assist Level: Minimal Assistance - Patient > 75%    Lower Body Dressing/Undressing Lower body dressing    Lower body dressing activity did not occur: Refused What is the patient wearing?: Pants     Lower body assist Assist for lower body dressing: Moderate Assistance - Patient 50 - 74%     Toileting Toileting Toileting Activity did not occur (Clothing management and hygiene only): N/A (no void or bm)  Toileting assist       Transfers Chair/bed  transfer  Transfers assist     Chair/bed transfer assist level: Minimal Assistance - Patient > 75%     Locomotion Ambulation   Ambulation assist   Ambulation activity did not occur: Safety/medical concerns  Assist level: Minimal Assistance - Patient > 75% Assistive device: Walker-rolling Max distance: 100 ft   Walk 10 feet activity   Assist  Walk 10 feet activity did not occur: Safety/medical concerns  Assist level: Minimal Assistance - Patient > 75% Assistive device: Walker-rolling   Walk 50 feet activity   Assist Walk 50 feet with 2 turns activity did not occur: Safety/medical concerns  Assist level: Minimal Assistance - Patient > 75% Assistive device: Walker-rolling    Walk 150 feet  activity   Assist Walk 150 feet activity did not occur: Safety/medical concerns         Walk 10 feet on uneven surface  activity   Assist Walk 10 feet on uneven surfaces activity did not occur: Safety/medical concerns         Wheelchair     Assist Is the patient using a wheelchair?: Yes Type of Wheelchair: Manual    Wheelchair assist level: Total Assistance - Patient < 25% Max wheelchair distance: 30 ft    Wheelchair 50 feet with 2 turns activity    Assist        Assist Level: Moderate Assistance - Patient 50 - 74%   Wheelchair 150 feet activity     Assist      Assist Level: Total Assistance - Patient < 25%   Blood pressure (!) 96/51, pulse 74, temperature 98.9 F (37.2 C), temperature source Oral, resp. rate 16, height 5\' 8"  (1.727 m), weight 102.7 kg, SpO2 98 %.  Medical Problem List and Plan: 1.  Deficits with mobility, self-care, transfers, endurance secondary to L pontine stroke   Continue CIR 2.  Antithrombotics: -DVT/anticoagulation:  Mechanical: Sequential compression devices, below knee Bilateral lower extremities  12/2- refuses SQ Heparin.              -antiplatelet therapy: DAPT X 3, changed to Plavix alone on 11/22 3. Pain Management: Tylenol prn.  4. Mood: LCSW to follow for evaluation and support.              -antipsychotic agents: N/A 5. Neuropsych: This patient is ?fully capable of making decisions on his own behalf. 6. Skin/Wound Care: Routine pressure relief measures.  7. Fluids/Electrolytes/Nutrition: Strict I/O. 1200 cc FR. Renal diet' 8. 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 Demand ischemia/NSVT: On amiodarone daily 10. Thrombocytopenia: Resolved 11. Anemia of chronic disease: ON Aranesp weekly for supplement.              Hemoglobin 8.3 on 12/1 12. T2DM with  hyperglycemia: Hgb  A1C-6.8.  Monitor BS ac/hs.  --Continue SSI as well as 4 units novolog TID with meals Slightly labile on 12/3, monitor for trend  Monitor with increased mobility 13. Small acute left pontine infarct             See #2 14. S/p b/l BKA- chronic             Continue to monitor- pt states he was amb with Bilateral prosthetics  15. Impaired cognition- mild to moderate with impulsiveness and poor safety  awareness- will con't SLP and therapies, however this is pt's biggest limitation.  16. Constipation  Sorbitol being ordered periodically  MiraLAX ordered on 12/3 17/ Sleepiness/sedation Improving 18. Fever- low grade 100.7- CXR relatively unremarkable-labs ordered per nephro-appreciate assistance  LOS: 13 days A FACE TO FACE EVALUATION WAS PERFORMED  Matix Henshaw Lorie Phenix 02/07/2021, 10:30 AM

## 2021-02-07 NOTE — Progress Notes (Signed)
[facility] NEPHROLOGY PROGRESS NOTE  Assessment/ Plan:   # Deconditioning/mobility deficits: Following hospitalization and small acute left pontine infarct.  Continues to report making improvements with PT/OT and tentative discharge date set for 12/7.  #  End-stage renal disease: From underlying progressive chronic kidney disease and exacerbated by cardiorenal syndrome-started on hemodialysis that he is now getting on TTS schedule (placed to chair at [facility]).  Status post left upper arm AV graft on 01/19/2021 with ipsilateral arm swelling possibly from venous hypertension versus postoperative edema being treated with ultrafiltration and arm elevation.  The swelling is gradually improving.  Plan for dialysis today.  We will check lab before dialysis, discussed with the nurse.  No  # Anemia: Without overt blood loss.  Continue ESA, monitor hemoglobin.    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  #. Nutrition: Continue renal diet with ongoing renal multivitamin supplementation.  Continue nutritional supplementation as indicated by albumin levels.  # Hypertension: Blood pressure soft, not on antihypertensive.  Asymptomatic.  Monitor BP.    #Hyponatremia: Managed with dialysis, continue fluid restriction.  #Acute febrile illness: The catheter site looks clean.  Clinically asymptomatic.  I will check blood culture.  Chest x-ray done yesterday without any acute finding.  Subjective: Seen and examined.  He had a temperature of 100.7 this morning.  Denies nausea, vomiting, chest pain, shortness of breath.  Objective Vital signs in last 24 hours: Vitals:   02/06/21 0549 02/06/21 1350 02/06/21 1934 02/07/21 0530  BP: (!) 117/56 (!) 141/119 (!) 108/56 (!) 96/51  Pulse: 73 76 72 74  Resp: 17 17 16 16   Temp: (!) 100.7 F (38.2 C) 99.1 F (37.3 C) (!) 100.6 F (38.1 C) 98.9 F (37.2 C)  TempSrc: Oral Oral Oral Oral   SpO2: 98% 99% 97% 98%  Weight: 102.7 kg     Height:       Weight change:   Intake/Output Summary (Last 24 hours) at 02/07/2021 0928 Last data filed at 02/06/2021 1833 Gross per 24 hour  Intake 234 ml  Output --  Net 234 ml        Labs: Basic Metabolic Panel: Recent Labs  Lab 02/03/21 1100 02/05/21 1211  NA 131* 129*  K 3.9 4.2  CL 95* 90*  CO2 24 27  GLUCOSE 106* 157*  BUN 38* 36*  CREATININE 6.72* 5.74*  CALCIUM 7.8* 7.6*  PHOS 4.3 4.3    Liver Function Tests: Recent Labs  Lab 02/03/21 1100 02/05/21 1211  ALBUMIN 2.6* 2.6*    No results for input(s): LIPASE, AMYLASE in the last 168 hours. No results for input(s): AMMONIA in the last 168 hours. CBC: Recent Labs  Lab 02/03/21 1100 02/05/21 1211  WBC 8.0 7.0  HGB 8.6* 8.3*  HCT 26.5* 25.9*  MCV 93.6 92.2  PLT 214 207    Cardiac Enzymes: No results for input(s): CKTOTAL, CKMB, CKMBINDEX, TROPONINI in the last 168 hours. CBG: Recent Labs  Lab 02/06/21 0551 02/06/21 1156 02/06/21 1615 02/06/21 2126 02/07/21 0742  GLUCAP 244* 169* 123* 114* 208*     Iron Studies: No results for input(s): IRON, TIBC, TRANSFERRIN, FERRITIN in the last 72 hours. Studies/Results: DG CHEST PORT 1 VIEW  Result Date: 02/06/2021 CLINICAL DATA:  Fever, on dialysis EXAM: PORTABLE CHEST 1 VIEW COMPARISON:  01/22/2021 FINDINGS: Right dialysis catheter is unchanged. Persistent linear atelectasis/scarring mid right lung. Probable minimal atelectasis right lung base. No pleural effusion. Stable cardiomegaly.  IMPRESSION: No acute process in the chest. Electronically Signed   By: Macy Mis M.D.   On: 02/06/2021 12:45    Medications: Infusions:    Scheduled Medications:  (feeding supplement) PROSource Plus  30 mL Oral BID BM   amiodarone  200 mg Oral Daily   atorvastatin  80 mg Oral QHS   Chlorhexidine Gluconate Cloth  6 each Topical Q0600   Chlorhexidine Gluconate Cloth  6 each Topical Q0600   Chlorhexidine  Gluconate Cloth  6 each Topical Q0600   clopidogrel  75 mg Oral Daily   darbepoetin (ARANESP) injection - DIALYSIS  100 mcg Intravenous Q Thu-HD   feeding supplement (NEPRO CARB STEADY)  237 mL Oral BID BM   gabapentin  300 mg Oral QHS   insulin aspart  0-15 Units Subcutaneous TID WC   insulin aspart  4 Units Subcutaneous TID WC   mouth rinse  15 mL Mouth Rinse BID   multivitamin  1 tablet Oral QHS   pantoprazole  40 mg Oral Q0600   polyethylene glycol  17 g Oral Daily    have reviewed scheduled and prn medications.  Physical Exam: General: Not in distress, looks comfortable Heart:RRR, s1s2 nl Lungs: Clear b/l, no crackle Abdomen:soft, Non-tender, non-distended Extremities: Bilateral below-knee amputation Dialysis Access: Right IJ TDC, left upper extremity with AV fistula, the edema is gradually improving.  Farheen Pfahler Prasad Zuleyka Kloc 02/07/2021,9:28 AM  LOS: 13 days

## 2021-02-07 NOTE — Progress Notes (Signed)
Physical Therapy Session Note  Patient Details  Name: [name] MRN: [id number] Date of Birth: Nov 11, 1950  Today's Date: 02/07/2021 PT Individual Time: 7829-5621 PT Individual Time Calculation (min): 80 min   Short Term Goals: Week 1:  PT Short Term Goal 1 (Week 1): Pt will demonstrate bed mobility with consistent CGA. PT Short Term Goal 1 - Progress (Week 1): Met PT Short Term Goal 2 (Week 1): Pt will don BLE prosthetics with all necessary parts for proper fit with MinA. PT Short Term Goal 2 - Progress (Week 1): Met PT Short Term Goal 3 (Week 1): Pt will perform sit<>stand  and pivot transfers with consistent Min/ ModA PT Short Term Goal 3 - Progress (Week 1): Partly met PT Short Term Goal 4 (Week 1): Pt will initiate gait training. PT Short Term Goal 4 - Progress (Week 1): Progressing toward goal PT Short Term Goal 5 (Week 1): Pt will initiate w/c mobility and parts management training. PT Short Term Goal 5 - Progress (Week 1): Met Week 2:  PT Short Term Goal 1 (Week 2): =LTGs d/t ELOS  Skilled Therapeutic Interventions/Progress Updates:  Patient supine in bed on entrance to room. Patient alert and not initially agreeable to PT session, but quickly requests prosthetics in order to participate.  Patient with no pain complaint throughout session.  Therapeutic Activity: Bed Mobility: Patient performed supine <> sit with supervision. Pt dons Bil prosthetics with mostly setup but does require MinA for actual don of socket to leg. No vc/ tc required for technique. Transfers: Patient performed sit<>stand transfers throughout session with MinA/ CGA and stand pivot transfers throughout session with CGA/ close supervision. Provided education and vc/ tc for biomechanics of rising to stand and need for improved foot positioning as well as increased forward lean. Pt relates that if he puts his feet further backward, then his foot will not be fully on the floor. Physical demonstration and verbal  instruction provided that heel does not have to initially have contact, but during rise-to-stand, with knee extension, heel will lower to floor. Pt with small but improved attempt for proper initial foot positioning. Looks to want to try in future sessions outside of this therapist.   Discussion with pt re: need for prosthetist to visit and re-establish with pt especially since start of dialysis. This therapist to setup visit with [employer] representative.   Gait Training:  Patient ambulated 81' x1/ 120 x1 ft using replacement RW with improved sliders and close supervision. Demonstrated flexed posture intermittently requiring vc/ tc for proximity to walker and improved upright posture. VC also provided for increased L knee flexion during swing through.   Neuromuscular Re-ed: NMR facilitated during session with focus on increased hip muscle activation, coordination, standing balance. Pt guided in sit<>stand training with lateral stepping along EOM in therapy gym. Lateral stepping initiated for improved hip strengthening and proprioception of balance. Sit<>stand performed with slight elevation to mat and tc/ vc for scooting forward to EOM and increasing push forward into more anterior weight shift to lift from seat. Continues to require MinA for power up. NMR performed for improvements in motor control and coordination, balance, sequencing, judgement, and self confidence/ efficacy in performing all aspects of mobility at highest level of independence.   Patient supine  in bed at end of session with brakes locked, bed alarm set, and all needs within reach. Pt setup for trip to dialysis.     Therapy Documentation Precautions:  Precautions Precautions: Fall, Other (comment) Precaution Comments:  bilateral BKA Required Braces or Orthoses: Other Brace Other Brace: Bilateral prostheses Restrictions Weight Bearing Restrictions: No General:    Pain:  No pain complaint this session.   Therapy/Group:  Individual Therapy  Alger Simons PT, DPT 02/07/2021, 7:59 AM

## 2021-02-08 LAB — GLUCOSE, CAPILLARY
Glucose-Capillary: 134 mg/dL — ABNORMAL HIGH (ref 70–99)
Glucose-Capillary: 141 mg/dL — ABNORMAL HIGH (ref 70–99)
Glucose-Capillary: 108 mg/dL — ABNORMAL HIGH (ref 70–99)
Glucose-Capillary: 101 mg/dL — ABNORMAL HIGH (ref 70–99)

## 2021-02-08 MED ORDER — SIMETHICONE 40 MG/0.6ML PO SUSP
40.0000 mg | Freq: Once | ORAL | Status: AC
  Administered 2021-02-08: 40 mg via ORAL
  Filled 2021-02-08: qty 0.6

## 2021-02-08 NOTE — Progress Notes (Signed)
Occupational Therapy Session Note  Patient Details  Name: [name] MRN: [id number] Date of Birth: Jul 05, 1950  Today's Date: 02/08/2021 OT Individual Time:  - 30 minutes missed     Short Term Goals: Week 1:  OT Short Term Goal 1 (Week 1): PT will complete BSC or toilet transfer with 1 assist and LRAD OT Short Term Goal 1 - Progress (Week 1): Progressing toward goal OT Short Term Goal 2 (Week 1): Pt will don B prosthetics with supervision assistance OT Short Term Goal 2 - Progress (Week 1): Progressing toward goal OT Short Term Goal 3 (Week 1): Pt will sit EOB ~20 minutes during self care activity before returning to bed to rest in order to increase functional activity tolerance OT Short Term Goal 3 - Progress (Week 1): Progressing toward goal  Skilled Therapeutic Interventions/Progress Updates:    Pt greeted in bed resting with his ex wife present. Pt reported feeling very tired from therapies today. OT encouraged him to participate in bedlevel or EOB tx as we had a short session together. Pt responded "Did you hear me? I said I don't wanna do nothin'!" His ex wife said that she had no further questions from therapy regarding d/c. Tx time missed due to pt refusal, he denied pain.   Therapy Documentation Precautions:  Precautions Precautions: Fall, Other (comment) Precaution Comments: bilateral BKA Required Braces or Orthoses: Other Brace Other Brace: Bilateral prostheses Restrictions Weight Bearing Restrictions: No  ADL: ADL Eating: Not assessed Grooming: Minimal assistance Where Assessed-Grooming: Edge of bed Upper Body Bathing: Supervision/safety Where Assessed-Upper Body Bathing: Edge of bed Lower Body Bathing: Minimal assistance Where Assessed-Lower Body Bathing: Bed level Upper Body Dressing: Moderate assistance Where Assessed-Upper Body Dressing: Edge of bed Lower Body Dressing: Not assessed Toileting: Not assessed Toilet Transfer: Not assessed Tub/Shower  Transfer: Not assessed  Therapy/Group: Individual Therapy  [name] [name] 02/08/2021, 7:10 PM

## 2021-02-08 NOTE — Plan of Care (Signed)
  Problem: Consults Goal: RH STROKE PATIENT EDUCATION Description: See Patient Education module for education specifics  Outcome: Progressing   Problem: RH BOWEL ELIMINATION Goal: RH STG MANAGE BOWEL WITH ASSISTANCE Description: STG Manage Bowel with  mod I Assistance. Outcome: Progressing Goal: RH STG MANAGE BOWEL W/MEDICATION W/ASSISTANCE Description: STG Manage Bowel with Medication with  mod I Assistance. Outcome: Progressing   Problem: RH SAFETY Goal: RH STG ADHERE TO SAFETY PRECAUTIONS W/ASSISTANCE/DEVICE Description: STG Adhere to Safety Precautions With cues Assistance/Device. Outcome: Progressing   Problem: RH KNOWLEDGE DEFICIT Goal: RH STG INCREASE KNOWLEDGE OF DIABETES Description: Patient will be able to manage DM with medications and dietary modifications using handouts and educational resources independently Outcome: Progressing Goal: RH STG INCREASE KNOWLEDGE OF HYPERTENSION Description: Patient will be able to manage HTN with medications and dietary modifications using handouts and educational resources independently Outcome: Progressing Goal: RH STG INCREASE KNOWLEDGE OF DYSPHAGIA/FLUID INTAKE Description: Patient will be able to manage Dysphagia, medications and dietary modifications using handouts and educational resources independently Outcome: Progressing Goal: RH STG INCREASE KNOWLEGDE OF HYPERLIPIDEMIA Description: Patient will be able to manage HLD with medications and dietary modifications using handouts and educational resources independently Outcome: Progressing Goal: RH STG INCREASE KNOWLEDGE OF STROKE PROPHYLAXIS Description: Patient will be able to manage secondary risks with medications and dietary modifications using handouts and educational resources independently Outcome: Progressing

## 2021-02-08 NOTE — Progress Notes (Signed)
[facility] NEPHROLOGY PROGRESS NOTE  Assessment/ Plan:   # Deconditioning/mobility deficits: Following hospitalization and small acute left pontine infarct.  Continues to report making improvements with PT/OT and tentative discharge date set for 12/7.  #  End-stage renal disease: From underlying progressive chronic kidney disease and exacerbated by cardiorenal syndrome-started on hemodialysis that he is now getting on TTS schedule (placed to chair at [facility]).  Status post left upper arm AV graft on 01/19/2021 with ipsilateral arm swelling possibly from venous hypertension versus postoperative edema being treated with ultrafiltration and arm elevation.  The arm swelling is gradually improving but still not gone completely therefore we will call vascular tomorrow to have a look.  Status post dialysis yesterday with 3.5 L ultrafiltration, tolerated well.  The next dialysis will be on 12/6.  # Anemia: Without overt blood loss.  Continue ESA, monitor hemoglobin.    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  #. Nutrition: Continue renal diet with ongoing renal multivitamin supplementation.  Continue nutritional supplementation as indicated by albumin levels.  # Hypertension: Blood pressure soft, not on antihypertensive.  Asymptomatic.  Monitor BP.    #Hyponatremia: Managed with dialysis, continue fluid restriction.  #Acute febrile illness on 12/3: The catheter site looks clean.  Clinically asymptomatic.  Checked blood culture yesterday, negative so far.  Chest x-ray done yesterday without any acute finding.  Afebrile today.  Subjective: Seen and examined.  He is afebrile today.  Tolerated dialysis yesterday.  Denies nausea, vomiting, chest pain, shortness of breath.  No new event.  Objective Vital signs in last 24 hours: Vitals:   02/07/21 1819 02/07/21 1932 02/08/21 0427 02/08/21 0432  BP: 124/60 (!) 110/57  (!) 110/53   Pulse: 79 77  72  Resp: 18 16  18   Temp: 99.3 F (37.4 C) 99.3 F (37.4 C)  99.1 F (37.3 C)  TempSrc: Oral     SpO2: 98% 95%  98%  Weight:   101.8 kg   Height:       Weight change:   Intake/Output Summary (Last 24 hours) at 02/08/2021 1004 Last data filed at 02/08/2021 0727 Gross per 24 hour  Intake 472 ml  Output 3500 ml  Net -3028 ml        Labs: Basic Metabolic Panel: Recent Labs  Lab 02/03/21 1100 02/05/21 1211 02/07/21 1348  NA 131* 129* 128*  K 3.9 4.2 3.9  CL 95* 90* 92*  CO2 24 27 26   GLUCOSE 106* 157* 131*  BUN 38* 36* 30*  CREATININE 6.72* 5.74* 4.78*  CALCIUM 7.8* 7.6* 7.8*  PHOS 4.3 4.3 3.9    Liver Function Tests: Recent Labs  Lab 02/03/21 1100 02/05/21 1211 02/07/21 1348  ALBUMIN 2.6* 2.6* 2.4*    No results for input(s): LIPASE, AMYLASE in the last 168 hours. No results for input(s): AMMONIA in the last 168 hours. CBC: Recent Labs  Lab 02/03/21 1100 02/05/21 1211 02/07/21 1348  WBC 8.0 7.0 5.9  HGB 8.6* 8.3* 7.8*  HCT 26.5* 25.9* 24.3*  MCV 93.6 92.2 92.4  PLT 214 207 195    Cardiac Enzymes: No results for input(s): CKTOTAL, CKMB, CKMBINDEX, TROPONINI in the last 168 hours. CBG: Recent Labs  Lab 02/07/21 0742 02/07/21 1146 02/07/21 1815 02/07/21 2111 02/08/21 0610  GLUCAP 208* 126* 94 164* 134*     Iron Studies: No results for input(s): IRON, TIBC, TRANSFERRIN, FERRITIN in the last 72 hours. Studies/Results: No results found.  Medications: Infusions:    Scheduled Medications:  (feeding supplement) PROSource Plus  30 mL Oral BID BM   amiodarone  200 mg Oral Daily   atorvastatin  80 mg Oral QHS   Chlorhexidine Gluconate Cloth  6 each Topical Q0600   Chlorhexidine Gluconate Cloth  6 each Topical Q0600   Chlorhexidine Gluconate Cloth  6 each Topical Q0600   clopidogrel  75 mg Oral Daily   darbepoetin (ARANESP) injection - DIALYSIS  100 mcg Intravenous Q Thu-HD   feeding supplement (NEPRO CARB STEADY)  237  mL Oral BID BM   gabapentin  300 mg Oral QHS   insulin aspart  0-15 Units Subcutaneous TID WC   insulin aspart  4 Units Subcutaneous TID WC   mouth rinse  15 mL Mouth Rinse BID   multivitamin  1 tablet Oral QHS   pantoprazole  40 mg Oral Q0600   polyethylene glycol  17 g Oral BID    have reviewed scheduled and prn medications.  Physical Exam: General: Lying on bed comfortable, not in distress Heart:RRR, s1s2 nl Lungs: Clear b/l, no crackle Abdomen:soft, Non-tender, non-distended Extremities: Bilateral below-knee amputation Dialysis Access: Right IJ TDC, left upper extremity with AVG, the edema is gradually improving.  I can hear bruit and thrill but he still not ready to cannulate.  [name] Prasad Adeleine Pask 02/08/2021,10:04 AM  LOS: 14 days

## 2021-02-08 NOTE — Progress Notes (Signed)
Occupational Therapy Session Note  Patient Details  Name: [name] MRN: [id number] Date of Birth: [birth year]  Today's Date: 02/08/2021 OT Individual Time: 2353-6144 OT Individual Time Calculation (min): 54 min    Short Term Goals: Week 1:  OT Short Term Goal 1 (Week 1): PT will complete BSC or toilet transfer with 1 assist and LRAD OT Short Term Goal 1 - Progress (Week 1): Progressing toward goal OT Short Term Goal 2 (Week 1): Pt will don B prosthetics with supervision assistance OT Short Term Goal 2 - Progress (Week 1): Progressing toward goal OT Short Term Goal 3 (Week 1): Pt will sit EOB ~20 minutes during self care activity before returning to bed to rest in order to increase functional activity tolerance OT Short Term Goal 3 - Progress (Week 1): Progressing toward goal Week 2:  OT Short Term Goal 1 (Week 2): Pt will perform toilet/BSC transfer with CGA and LRAD OT Short Term Goal 2 (Week 2): Pt will don B prosthetics with Supervision OT Short Term Goal 3 (Week 2): Pt will perform LB dress with AE PRN with Min A OT Short Term Goal 4 (Week 2): Pt will perform ADL/task of choice for 10 minutes with no rest break to improve global endurance   Skilled Therapeutic Interventions/Progress Updates:    Pt greeted at time of session semireclined in bed, already finished eating lunch. Agreeable to OT session and no pain reported. Pt donned prosthetics and sleeves with Supervision/set up except needing Min A to help click into place. Needing extended time and even a reclined rest as pt has endurance deficits, reporting fatigue. Pt's family members present at this time, brother and sister, for a visit. Note family members having several questions about DC, if pt would have a nurse full time, etc. Relayed to family that training was scheduled for tomorrow with pt's ex wife, both relaying they want the pt to have a full time caregiver and relayed that our recommendation is also 24/7. However, family  members recommended to speak with the pt regarding his wishes and who he may want to be caregiver and the pt can relay to staff. Reached out to SW via message regarding a list of potential home care aide agencies. Pt donning pants at EOB with brother stepping in to assist with pants, recommended allowing pt to attempt first, pt needing Mod A overall with pants. Discussed having patient practice toileting, again declined. Stand pivot bed > wheelchair Min A. Wheelchair transport to gym and focused on side stepping in prep for side stepping through bathroom doorways at home. 1x15-20 with 5# dowel for the following: bicep curl, chest press, overhead press. Transported back to room and set up alarm on call bell in reach and LUE elevated.   Therapy Documentation Precautions:  Precautions Precautions: Fall, Other (comment) Precaution Comments: bilateral BKA Required Braces or Orthoses: Other Brace Other Brace: Bilateral prostheses Restrictions Weight Bearing Restrictions: No     Therapy/Group: Individual Therapy  Viona Gilmore 02/08/2021, 8:42 AM

## 2021-02-09 LAB — GLUCOSE, CAPILLARY
Glucose-Capillary: 86 mg/dL (ref 70–99)
Glucose-Capillary: 166 mg/dL — ABNORMAL HIGH (ref 70–99)
Glucose-Capillary: 122 mg/dL — ABNORMAL HIGH (ref 70–99)
Glucose-Capillary: 138 mg/dL — ABNORMAL HIGH (ref 70–99)
Glucose-Capillary: 125 mg/dL — ABNORMAL HIGH (ref 70–99)

## 2021-02-09 LAB — HEPATITIS B SURFACE ANTIBODY, QUANTITATIVE: Hep B S AB Quant (Post): <3.1 m[IU]/mL — ABNORMAL LOW (ref >9.9)

## 2021-02-09 NOTE — Progress Notes (Signed)
Physical Therapy Session Note  Patient Details  Name: [name] MRN: [id number] Date of Birth: 01/07/1951  Today's Date: 02/09/2021 PT Individual Time: 0900-1000, 1305-1330 PT Individual Time Calculation (min): 60 min, 25 min   Short Term Goals: Week 1:  PT Short Term Goal 1 (Week 1): Pt will demonstrate bed mobility with consistent CGA. PT Short Term Goal 1 - Progress (Week 1): Met PT Short Term Goal 2 (Week 1): Pt will don BLE prosthetics with all necessary parts for proper fit with MinA. PT Short Term Goal 2 - Progress (Week 1): Met PT Short Term Goal 3 (Week 1): Pt will perform sit<>stand  and pivot transfers with consistent Min/ ModA PT Short Term Goal 3 - Progress (Week 1): Partly met PT Short Term Goal 4 (Week 1): Pt will initiate gait training. PT Short Term Goal 4 - Progress (Week 1): Progressing toward goal PT Short Term Goal 5 (Week 1): Pt will initiate w/c mobility and parts management training. PT Short Term Goal 5 - Progress (Week 1): Met Week 2:  PT Short Term Goal 1 (Week 2): =LTGs d/t ELOS  Skilled Therapeutic Interventions/Progress Updates:    Session 1: pt received in bed and agreeable to therapy. No complaint of pain. Pt donned prosthetic liners and socks with set up while semi reclined. Pt declined practicing bed mobility from flat bed, even when reminded that he will not have adjustable bed at time. Bed mobility with supervision and donned BLE prosthetics with min A. Mod A Sit to stand and CGA Stand pivot transfer to w/c. Pt transported to therapy gym for time management and energy conservation.   Pt directed in 3" stairs x 8 with CGA and BUE support. Pt then declined attempting 6" stairs, despite education on needing to be able to perform them at d/c. Pt opted to perform standing exercise. Elevated lunges on 6" step 2 x 6. Pt had difficulty lifting BLE onto step and maintained forward flexed posture that did not correct with multimodal cues. Pt was transported back  to room and remained in w/c, was left with all needs in reach and alarm active.    Session 2: Pt in w/c on arrival, with his ex-wife, [name], present for family education. Pt was sleepy and dozing during session. Pt's lunch had just arrived so therapist left to allow time to eat, but NT informed therapist that pt was not eating. Therapist returned and pt declined any mobility d/t fatigue. Provided verbal education to [name], who expressed understanding. Therapist alerted her that pt has been unwilling to attempt 6" stairs and bed mobility from a flat bed despite home set. [name] confirmed that they are getting a ramp built that should be completed by d/c. Pt agreeable to get into bed. CGA Stand pivot transfer with RW. Pt doffed BLE prosthetics and liners while seated EOB with supervision. Supine>sit with flat bed with supervision and VC, heavy use of bed rail. [name] confirmed that pt's  behavior is baseline and she seemed unconcerned with his abilities and attitude toward therapy. Pt remained in bed with HOB elevated to eat lunch and was left with all needs in reach and alarm active. Pt missed 35 min of scheduled PT d/t fatigue and refusal to participate.   Therapy Documentation Precautions:  Precautions Precautions: Fall, Other (comment) Precaution Comments: bilateral BKA Required Braces or Orthoses: Other Brace Other Brace: Bilateral prostheses Restrictions Weight Bearing Restrictions: No General: PT Amount of Missed Time (min): 35 Minutes PT Missed Treatment Reason:  Patient fatigue;Patient unwilling to participate     Therapy/Group: Individual Therapy  [name] 02/09/2021, 12:56 PM

## 2021-02-09 NOTE — Progress Notes (Signed)
PROGRESS NOTE   Subjective/Complaints:  No cough , no sweats or chills, feels rushed when placed on commode  ROS: Denies CP, SOB, N/V/D  Objective:   No results found. Recent Labs    02/07/21 1348  WBC 5.9  HGB 7.8*  HCT 24.3*  PLT 195     Recent Labs    02/07/21 1348  NA 128*  K 3.9  CL 92*  CO2 26  GLUCOSE 131*  BUN 30*  CREATININE 4.78*  CALCIUM 7.8*      Intake/Output Summary (Last 24 hours) at 02/09/2021 0740 Last data filed at 02/08/2021 2049 Gross per 24 hour  Intake 712 ml  Output --  Net 712 ml         Physical Exam: Vital Signs Blood pressure 135/63, pulse 81, temperature 98 F (36.7 C), resp. rate 18, height 5\' 8"  (1.727 m), weight 101.3 kg, SpO2 100 %.  General: No acute distress Mood and affect are appropriate Heart: Regular rate and rhythm no rubs murmurs or extra sounds Lungs: Clear to auscultation, breathing unlabored, no rales or wheezes Abdomen: Positive bowel sounds, soft nontender to palpation, nondistended Extremities: No clubbing, cyanosis, or edema Skin: No evidence of breakdown, no evidence of rash   Neuro: Alert Dysarthria, stable Motor: Grossly 4/5, left weaker than right, unchanged  Assessment/Plan: 1. Functional deficits which require 3+ hours per day of interdisciplinary therapy in a comprehensive inpatient rehab setting. Physiatrist is providing close team supervision and 24 hour management of active medical problems listed below. Physiatrist and rehab team continue to assess barriers to discharge/monitor patient progress toward functional and medical goals  Care Tool:  Bathing    Body parts bathed by patient: Chest, Abdomen, Face, Right arm, Left arm, Left upper leg, Right upper leg   Body parts bathed by helper: Buttocks Body parts n/a: Right lower leg, Left lower leg   Bathing assist Assist Level: Moderate Assistance - Patient 50 - 74%     Upper Body  Dressing/Undressing Upper body dressing   What is the patient wearing?: Pull over shirt    Upper body assist Assist Level: Minimal Assistance - Patient > 75%    Lower Body Dressing/Undressing Lower body dressing    Lower body dressing activity did not occur: Refused What is the patient wearing?: Pants     Lower body assist Assist for lower body dressing: Moderate Assistance - Patient 50 - 74%     Toileting Toileting Toileting Activity did not occur Landscape architect and hygiene only): N/A (no void or bm)  Toileting assist       Transfers Chair/bed transfer  Transfers assist     Chair/bed transfer assist level: Minimal Assistance - Patient > 75%     Locomotion Ambulation   Ambulation assist   Ambulation activity did not occur: Safety/medical concerns  Assist level: Minimal Assistance - Patient > 75% Assistive device: Walker-rolling Max distance: 100 ft   Walk 10 feet activity   Assist  Walk 10 feet activity did not occur: Safety/medical concerns  Assist level: Minimal Assistance - Patient > 75% Assistive device: Walker-rolling   Walk 50 feet activity   Assist Walk 50 feet with 2 turns  activity did not occur: Safety/medical concerns  Assist level: Minimal Assistance - Patient > 75% Assistive device: Walker-rolling    Walk 150 feet activity   Assist Walk 150 feet activity did not occur: Safety/medical concerns         Walk 10 feet on uneven surface  activity   Assist Walk 10 feet on uneven surfaces activity did not occur: Safety/medical concerns         Wheelchair     Assist Is the patient using a wheelchair?: Yes Type of Wheelchair: Manual    Wheelchair assist level: Total Assistance - Patient < 25% Max wheelchair distance: 30 ft    Wheelchair 50 feet with 2 turns activity    Assist        Assist Level: Moderate Assistance - Patient 50 - 74%   Wheelchair 150 feet activity     Assist      Assist Level:  Total Assistance - Patient < 25%   Blood pressure 135/63, pulse 81, temperature 98 F (36.7 C), resp. rate 18, height 5\' 8"  (1.727 m), weight 101.3 kg, SpO2 100 %.  Medical Problem List and Plan: 1.  Deficits with mobility, self-care, transfers, endurance secondary to L pontine stroke   Continue CIR PT, OT, SLP ELOS 12/7 2.  Antithrombotics: -DVT/anticoagulation:  Mechanical: Sequential compression devices, below knee Bilateral lower extremities  12/2- refuses SQ Heparin.              -antiplatelet therapy: DAPT X 3, changed to Plavix alone on 11/22 3. Pain Management: Tylenol prn.  4. Mood: LCSW to follow for evaluation and support.              -antipsychotic agents: N/A 5. Neuropsych: This patient is ?fully capable of making decisions on his own behalf. 6. Skin/Wound Care: Routine pressure relief measures.  7. Fluids/Electrolytes/Nutrition: Strict I/O. 1200 cc FR. Renal diet' 8. 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 Demand ischemia/NSVT: On amiodarone daily 10. Thrombocytopenia: Resolved 11. Anemia of chronic disease: ON Aranesp weekly for supplement.              Hemoglobin 8.3 on 12/1 12. T2DM with hyperglycemia: Hgb  A1C-6.8.  Monitor BS ac/hs.  --Continue SSI as well as 4 units novolog TID with meals Slightly labile on 12/3, monitor for trend  Monitor with increased mobility 13. Small acute left pontine infarct             See #2 14. S/p b/l BKA- chronic             Continue to monitor- pt states he was amb with Bilateral prosthetics  15. Impaired cognition- mild to moderate with impulsiveness and poor safety awareness- will con't SLP and therapies, however this is pt's biggest limitation.  16. Constipation  Sorbitol being ordered periodically  MiraLAX ordered on 12/3 17/ Sleepiness/sedation Improving 18. Fever- low  grade <100 for last 48h - CXR c/w atelectasis, order IS LOS: 15 days A FACE TO FACE EVALUATION WAS PERFORMED  Charlett Blake 02/09/2021, 7:40 AM

## 2021-02-09 NOTE — Progress Notes (Signed)
Inpatient Rehabilitation Discharge Medication Review by a Pharmacist  A complete drug regimen review was completed for this patient to identify any potential clinically significant medication issues.  High Risk Drug Classes Is patient taking? Indication by Medication  Antipsychotic Yes Compazine for N/V for inpt only  Anticoagulant No   Antibiotic No   Opioid Yes Oxycodone for severe pain  Antiplatelet Yes Plavix for CVA  Hypoglycemics/insulin Yes Insulin for DM  Vasoactive Medication Yes Amiodarone for V tach  Chemotherapy No   Other Yes Aranesp for anemia Lipitor for HLD Protonix for GERD     Type of Medication Issue Identified Description of Issue Recommendation(s)  Drug Interaction(s) (clinically significant)     Duplicate Therapy     Allergy     No Medication Administration End Date     Incorrect Dose     Additional Drug Therapy Needed  Rebelsus Resume at discharge if appropriate  Significant med changes from prior encounter (inform family/care partners about these prior to discharge).    Other       Clinically significant medication issues were identified that warrant physician communication and completion of prescribed/recommended actions by midnight of the next day:  No  Pharmacist comments: Semaglutide on hold  Time spent performing this drug regimen review (minutes): 20 minutes   Onnie Boer, PharmD, [place], AAHIVP, CPP Infectious Disease Pharmacist 02/09/2021 9:00 AM

## 2021-02-09 NOTE — Progress Notes (Signed)
Occupational Therapy Session Note  Patient Details  Name: [name] MRN: [id number] Date of Birth: 1950/08/29  Today's Date: 02/09/2021 OT Individual Time: 1000-1054 OT Individual Time Calculation (min): 54 min    Short Term Goals: Week 1:  OT Short Term Goal 1 (Week 1): PT will complete BSC or toilet transfer with 1 assist and LRAD OT Short Term Goal 1 - Progress (Week 1): Progressing toward goal OT Short Term Goal 2 (Week 1): Pt will don B prosthetics with supervision assistance OT Short Term Goal 2 - Progress (Week 1): Progressing toward goal OT Short Term Goal 3 (Week 1): Pt will sit EOB ~20 minutes during self care activity before returning to bed to rest in order to increase functional activity tolerance OT Short Term Goal 3 - Progress (Week 1): Progressing toward goal Week 2:  OT Short Term Goal 1 (Week 2): Pt will perform toilet/BSC transfer with CGA and LRAD OT Short Term Goal 2 (Week 2): Pt will don B prosthetics with Supervision OT Short Term Goal 3 (Week 2): Pt will perform LB dress with AE PRN with Min A OT Short Term Goal 4 (Week 2): Pt will perform ADL/task of choice for 10 minutes with no rest break to improve global endurance   Skilled Therapeutic Interventions/Progress Updates:    Pt greeted at time of session up in wheelchair agreeable to OT session with encouragement. Note ex wife not present at this time. Pt declining ADL tasks saying he "didn't need to do it" today despite education on importance, agreeable to gym activity for strengthening. When preparing to exit room, ex wife arrived and adjusted to perform family education/training. Note that pt educated that needed to practice ADL with [name] present, pt continued to decline. Offered education for ex wife for don/doff orthotics but politely declined, stating she felt comfortable already. Pt transported to ortho gym and initially demonstrated pt ambulating short distance approx 15 feet with Supervision/CGA with RW,  therapist performing x1 and ex wife performing x1. Pt side stepping at EOM approx 10 feet as well with CGA/supervision. With max encouragement, pt agreeable to attempt toilet transfer. Utilizing bathroom by ortho gym with standard commode (which is what pt has at home but reports taller height than this one), pt side stepping into bathroom to simulate home, transferring to commode CGA. Note pt needing Mod/Max A for sit > stand from low surface but stating commode at home will not be this low. Recommended getting BSC out of storage in case needed to both ex wife and the pt. Transported back to room, LUE elevated and alarm on call bell in reach. Note that since pt declined all ADL tasks for bathing/dressing as part of education, verbally reviewed with ex wife that the pt does need care for toileting, bathing, and dressing 2/2 deficits and weakness.     Therapy Documentation Precautions:  Precautions Precautions: Fall, Other (comment) Precaution Comments: bilateral BKA Required Braces or Orthoses: Other Brace Other Brace: Bilateral prostheses Restrictions Weight Bearing Restrictions: No     Therapy/Group: Individual Therapy  Viona Gilmore 02/09/2021, 7:18 AM

## 2021-02-09 NOTE — Progress Notes (Signed)
[facility] NEPHROLOGY PROGRESS NOTE  Assessment/ Plan:   # Deconditioning/mobility deficits: Following hospitalization and small acute left pontine infarct.  Continues to report making improvements with PT/OT and tentative discharge date set for 12/7.  #  End-stage renal disease: From underlying progressive chronic kidney disease and exacerbated by cardiorenal syndrome-started on hemodialysis that he is now getting on TTS schedule (placed to chair at [facility]).  Status post left upper arm AV graft on 01/19/2021 with ipsilateral arm swelling possibly from venous hypertension versus postoperative edema being treated with ultrafiltration and arm elevation.  The arm swelling is gradually improving, if no improvement then will call [facility] to have them take a look.  The next dialysis will be on 12/6.  # Anemia: Without overt blood loss.  Continue ESA, monitor hemoglobin.    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  #. Nutrition: Continue renal diet with ongoing renal multivitamin supplementation.  Continue nutritional supplementation as indicated by albumin levels.  # Hypertension: Blood pressure soft, not on antihypertensive.  Asymptomatic.  Monitor BP.    #Hyponatremia: Managed with dialysis, continue fluid restriction.  #Acute febrile illness on 12/3: The catheter site looks clean.  Clinically asymptomatic. Bcx 12/3 ngtd. Chest x-ray done yesterday without any acute finding.  Afebrile today.  Subjective: Seen and examined.  No acute events. Tired from therapy earlier today. He reports that his lue swelling is slowly improving  Objective Vital signs in last 24 hours: Vitals:   02/08/21 1312 02/08/21 1938 02/09/21 0500 02/09/21 0534  BP: (!) 104/58 (!) 105/49  135/63  Pulse: 77 78  81  Resp: 18 18  18   Temp: 99.6 F (37.6 C) 99.2 F (37.3 C)  98 F (36.7 C)  TempSrc: Oral Oral    SpO2: 95% 96%  100%  Weight:    101.3 kg   Height:       Weight change: 0.7 kg  Intake/Output Summary (Last 24 hours) at 02/09/2021 1242 Last data filed at 02/09/2021 0835 Gross per 24 hour  Intake 830 ml  Output --  Net 830 ml       Labs: Basic Metabolic Panel: Recent Labs  Lab 02/03/21 1100 02/05/21 1211 02/07/21 1348  NA 131* 129* 128*  K 3.9 4.2 3.9  CL 95* 90* 92*  CO2 24 27 26   GLUCOSE 106* 157* 131*  BUN 38* 36* 30*  CREATININE 6.72* 5.74* 4.78*  CALCIUM 7.8* 7.6* 7.8*  PHOS 4.3 4.3 3.9   Liver Function Tests: Recent Labs  Lab 02/03/21 1100 02/05/21 1211 02/07/21 1348  ALBUMIN 2.6* 2.6* 2.4*   No results for input(s): LIPASE, AMYLASE in the last 168 hours. No results for input(s): AMMONIA in the last 168 hours. CBC: Recent Labs  Lab 02/03/21 1100 02/05/21 1211 02/07/21 1348  WBC 8.0 7.0 5.9  HGB 8.6* 8.3* 7.8*  HCT 26.5* 25.9* 24.3*  MCV 93.6 92.2 92.4  PLT 214 207 195   Cardiac Enzymes: No results for input(s): CKTOTAL, CKMB, CKMBINDEX, TROPONINI in the last 168 hours. CBG: Recent Labs  Lab 02/08/21 1145 02/08/21 1637 02/08/21 2045 02/09/21 0531 02/09/21 1125  GLUCAP 141* 108* 101* 86 166*    Iron Studies: No results for input(s): IRON, TIBC, TRANSFERRIN, FERRITIN in the last 72 hours. Studies/Results: No results found.  Medications: Infusions:    Scheduled Medications:  (feeding supplement) PROSource Plus  30 mL Oral BID BM   amiodarone  200 mg Oral Daily  atorvastatin  80 mg Oral QHS   Chlorhexidine Gluconate Cloth  6 each Topical Q0600   Chlorhexidine Gluconate Cloth  6 each Topical Q0600   Chlorhexidine Gluconate Cloth  6 each Topical Q0600   clopidogrel  75 mg Oral Daily   darbepoetin (ARANESP) injection - DIALYSIS  100 mcg Intravenous Q Thu-HD   feeding supplement (NEPRO CARB STEADY)  237 mL Oral BID BM   gabapentin  300 mg Oral QHS   insulin aspart  0-15 Units Subcutaneous TID WC   insulin aspart  4 Units Subcutaneous TID WC   mouth rinse  15 mL  Mouth Rinse BID   multivitamin  1 tablet Oral QHS   pantoprazole  40 mg Oral Q0600   polyethylene glycol  17 g Oral BID    have reviewed scheduled and prn medications.  Physical Exam: General: Lying on bed comfortable, not in distress Heart:RRR, s1s2 nl Lungs: Clear b/l, no crackle Abdomen:soft, Non-tender, non-distended Extremities: Bilateral below-knee amputation Dialysis Access: Right IJ TDC, left upper extremity with AVG, the edema is gradually improving.  I can hear bruit and thrill but he still not ready to be cannulated.  Nikayla Madaris 02/09/2021,12:42 PM  LOS: 15 days

## 2021-02-09 NOTE — Progress Notes (Signed)
Patient ID: BRONISLAUS VERDELL, male   DOB: 09/11/1950, 70 y.o.   MRN: [id number]  Met with pt and ex-wife who will be his caregiver who is here for education today in anticipation of discharge Wed 12/7. She reports it is going ok. Pt seems to do only what he wants to do. Discussed needing ramp for two steps and he reports he can get up and down them when he wants to. Ex-wife is aware of recommendation for a ramp and their son's are working on rails and will work on ramp. Pt seems to feel whatever he needs will be provided for which it will not. He wanted 3-4 hours of CNA care but does not have medicaid and his [facility] Medicare does not cover this. Discussed what they do cover which is home health.  Have set this up and have given ex-wife the information regarding [facility] and [facility] transport information and when to call to make ride reservation-day prior to appointment. Discussed equipment will order wheelchair and 3 in 1 for pt, he has a rolling walker. Will work toward discharge Wed. Both aware he will need 24/7 care at discharge.

## 2021-02-09 NOTE — Discharge Instructions (Addendum)
Inpatient Rehab Discharge Instructions  [name] Discharge date and time: 02/11/21   Activities/Precautions/ Functional Status: Activity: no lifting, driving, or strenuous exercise for till cleared by MD Diet: Renal diet--chopped foods. Limit fluids to 1200 cc/day (5 cups) Wound Care: keep wound clean and dry   Functional status:  ___ No restrictions     ___ Walk up steps independently _X__ 24/7 supervision/assistance   ___ Walk up steps with assistance ___ Intermittent supervision/assistance  ___ Bathe/dress independently ___ Walk with walker     __X_ Bathe/dress with assistance ___ Walk Independently    ___ Shower independently _X__ Walk with assistance    ___ Shower with assistance ___ No alcohol     ___ Return to work/school ________  Special Instructions:   COMMUNITY REFERRALS UPON DISCHARGE:    Home Health:   PT  OT  [place] Equipment/Items Ordered:WHEELCHAIR AND 3 IN 1                                                 Agency/Supplier:[facility]  [phone]  [facility] T, TH SAT-11:10 FOR 11:30 AM SLOT- NEEDS TO BE THERE ON 12/8 AT 10:30 TO COMPLETE PAPERWORK ACCESS Arley [place] [phone] CALL DAY IN ADVANCE TO RESERVE FOR APPOINTMENT  STROKE/TIA DISCHARGE INSTRUCTIONS SMOKING Cigarette smoking nearly doubles your risk of having a stroke & is the single most alterable risk factor  If you smoke or have smoked in the last 12 months, you are advised to quit smoking for your health. Most of the excess cardiovascular risk related to smoking disappears within a year of stopping. Ask you doctor about anti-smoking medications [place] Quit Line: 1-800-QUIT NOW Free Smoking Cessation Classes [phone]  CHOLESTEROL Know your levels; limit fat & cholesterol in your diet  Lipid Panel     Component Value Date/Time   CHOL 138 01/12/2021 0602   TRIG 61 01/12/2021 0602   HDL 32 (L)  01/12/2021 0602   CHOLHDL 4.3 01/12/2021 0602   VLDL 12 01/12/2021 0602   LDLCALC 94 01/12/2021 0602     Many patients benefit from treatment even if their cholesterol is at goal. Goal: Total Cholesterol (CHOL) less than 160 Goal:  Triglycerides (TRIG) less than 150 Goal:  HDL greater than 40 Goal:  LDL (LDLCALC) less than 100   BLOOD PRESSURE American Stroke Association blood pressure target is less that 120/80 mm/Hg  Your discharge blood pressure is:  BP: (!) 123/97 Monitor your blood pressure Limit your salt and alcohol intake Many individuals will require more than one medication for high blood pressure  DIABETES (A1c is a blood sugar average for last 3 months) Goal HGBA1c is under 7% (HBGA1c is blood sugar average for last 3 months)  Diabetes:     Lab Results  Component Value Date   HGBA1C 6.8 (H) 01/12/2021    Your HGBA1c can be lowered with medications, healthy diet, and exercise. Check your blood sugar as directed by your physician Call your physician if you experience unexplained or low blood sugars.  PHYSICAL ACTIVITY/REHABILITATION Goal is 30 minutes at least 4 days per week  Activity: No driving, Therapies:  Return to work: N/A Activity decreases your risk of heart attack and stroke and makes  your heart stronger.  It helps control your weight and blood pressure; helps you relax and can improve your mood. Participate in a regular exercise program. Talk with your doctor about the best form of exercise for you (dancing, walking, swimming, cycling).  DIET/WEIGHT Goal is to maintain a healthy weight  Your discharge diet is:  Diet Order             DIET DYS 2 Room service appropriate? Yes with Assist; Fluid consistency: Thin; Fluid restriction: 1200 mL Fluid  Diet effective now                   liquids Your height is:  Height: 5\' 8"  (172.7 cm) Your current weight is: Weight: 221 lbs Your Body Mass Index (BMI) is:  BMI (Calculated): 33.66 Following the type of  diet specifically designed for you will help prevent another stroke. Your goal weight range is:  164 lbs Your goal Body Mass Index (BMI) is 19-24. Healthy food habits can help reduce 3 risk factors for stroke:  High cholesterol, hypertension, and excess weight.  RESOURCES Stroke/Support Group:  Call [phone]   STROKE EDUCATION PROVIDED/REVIEWED AND GIVEN TO PATIENT Stroke warning signs and symptoms How to activate emergency medical system (call 911). Medications prescribed at discharge. Need for follow-up after discharge. Personal risk factors for stroke. Pneumonia vaccine given:  Flu vaccine given:  My questions have been answered, the writing is legible, and I understand these instructions.  I will adhere to these goals & educational materials that have been provided to me after my discharge from the hospital.      My questions have been answered and I understand these instructions. I will adhere to these goals and the provided educational materials after my discharge from the hospital.  Patient/Caregiver Signature _______________________________ Date __________  Clinician Signature _______________________________________ Date __________  Please bring this form and your medication list with you to all your follow-up doctor's appointments.

## 2021-02-09 NOTE — Progress Notes (Addendum)
Speech Language Pathology Discharge Summary  Patient Details  Name: [name] MRN: [id number] Date of Birth: August 26, 1950  Today's Date: 02/09/2021 SLP Individual Time: 1101-1130 SLP Individual Time Calculation (min): 29 min   Skilled Therapeutic Interventions:  Pt seen for skilled ST with focus on swallowing and cognition goals as well as family education. Pt ex-wife present, provided education on pt current cognitive status and recommendation for 24/7 supervision/assist. Ex-wife states pt "is the same as he was at home", doesn't like to be talked to and doesn't like to answer questions. She states she thinks he feels "caged" in here and when he is "free at home his brain will get better which will heal his body back to normal". Recommend supervision/assist with all higher level cognitive tasks (money management, med management, cooking, etc) which ex-wife states she can provide "if he lets me. He likes to do things himself even if I offer to help". SLP reviewing Dys 2 diet handout of recommended foods and foods to avoid, pt and ex-wife verbalized understanding. Pt and ex-wife with no questions at this time. Pt left in wheelchair with alarm belt activated and wife present for needs.  Patient has met 3 of 4 long term goals.  Patient to discharge at overall Supervision level.  Reasons goals not met: poor awareness   Clinical Impression/Discharge Summary:   Pt has made slow progress during CIR stay but has met 2 out of 3 long term goals. Pt was unable to meet Supervision level for emergent and anticipatory awareness, is discharging at overall supervision level for swallow function, functional memory and functional problem solving. Pt progress was limited by decreased participation and motivation in therapy sessions and deficits in error awareness impacting safety. Pt with inconsistent response to cues across sessions. Recommend 24/7 supervision at home due to baseline and current cognitive impairments  and assist with all high level cognitive tasks (med management, money management, etc). It is recommended pt continue Dys 2 diet at home 2' missing lower dentures, pt and ex-wife provided Dys 2 handout and provided education on recommended foods and foods to avoid. No f/u ST service recommended due to pt at baseline cognition per ex-wife.    Care Partner:  Caregiver Able to Provide Assistance: Yes  Type of Caregiver Assistance: Cognitive  Recommendation:  None   Reasons for discharge: Discharged from hospital - progress plateau   Patient/Family Agrees with Progress Made and Goals Achieved: Yes    Dewaine Conger 02/09/2021, 2:20 PM

## 2021-02-09 NOTE — Consult Note (Signed)
Neuropsychological Consultation   Patient:   [name]   DOB:   12/16/1950  MR Number:  [id number]  Location:  [facility] [id number] [place] [address] Dept: [place]: [phone]           Date of Service:   02/09/2021  Start Time:   8 AM End Time:   9 AM  Provider/Observer:  Ilean Skill, Psy.D.       Clinical Neuropsychologist       Billing Code/Service: [phone]  Chief Complaint:    [name] is a 70 year old male with history of hypertension, CAD status post CABG, CVA with short-term memory deficits, Type 2 diabetes, bilateral below-knee amputations, chronic kidney disease stage IV who is at times refused hemodialysis.  Patient was admitted on 01/11/2021 with nausea and vomiting and bilateral lower extremity edema.  Hemodialysis was initiated and treated for elevated cardiac enzymes that were felt to be due to demand ischemia and patient with cardiac renal syndrome.  Patient scheduled for discharge became unresponsive on 01/22/2021.  MRI brain showed small acute left pontine infarct and MRI revealed severe left and moderate right stenosis.  EEG was suggestive of mild diffuse encephalopathy without seizure.  Stroke was felt to be due to small/large vessel disease.   Reason for Service:  Patient was referred for neuropsychological consultation due to coping and adjustment issues in particular issues around motivation and therapy.  Patient had in the past refused hemodialysis which potentially played a significant role in his current medical status.  Below is the HPI for the current admission.  HPI: [name] is a 70 year old male with history of HTN, CAD s/p CABG, CVA w/STM deficits, T2DM, B-BKAs, CKD IV who has refused HD in the past and was admitted on 01/11/21 with N/V and bilateral LE edema due to acute on chronic CHF. History taken from chart review and patient.  Echocardiogram with EF of 30-35% and global hypokinesis. He was evaluated by Dr. Terrence Dupont and was started on IV lasix for diuresis.  EKG changes felt to be due to NSVT and was started on amiodarone for rate control.  Elevated cardiac enzymes felt to be due to demand ischemia and patient with cardiorenal syndrome with symptoms of uremia. HD initiated and AVG placed by Dr. Scot Dock on 01/19/21 and post op has had edema RUE but without numbness or tingling.     He was scheduled for discharge but became unresponsive on 01/22/21, was incontinent of B/B and confused when aroused. Dr. Cheral Marker consulted due to concerns of seizure activity and exam revealed asterixis likely due to metabolic derangements and questioned seizure due to low calcium. MRI brain done for work up and showed small acute left pontine infarct. 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     EEG was suggestive of mild diffuse encephalopathy without seizure or epileptiform discharges.    Stroke felt to be due to small/large vessel disease but cardioembolic source not ruled out. Dr. Erlinda Hong recommended ASA/plavix X 3 weeks followed by Plavix alone as well as 30 day cardiac event monitor to rule out A fib. . Patient with limitations in mobility and ADLs. CIR recommended due to functional decline. Please see preadmission assessment from earlier today as well.  Current Status:  Patient was awake and alert sitting up in his bed.  He was oriented x3 but rather blunted and lethargic.  Patient denied any mood  disturbance and denied any neurological changes other than changes in his left hand motor function.  Discussed issues around his motivation and degree of active participation in therapeutic interventions.  Patient reports that he is aware of how that if he does not follow through and do his dialysis that he will "die."  Patient reports that he does intend on more participation as he does not want to have to be taking care of by others but  his investment in the rehabilitation process remains in question.  Behavioral Observation: [name]  presents as a 70 y.o.-year-old Right handed African American Male who appeared his stated age. his dress was Appropriate and he was Well Groomed and his manners were Appropriate to the situation.  his participation was indicative of Appropriate and Resistant behaviors.  There were physical disabilities noted.  he displayed an appropriate level of cooperation and motivation.     Interactions:    Minimal   Attention:   abnormal and attention span appeared shorter than expected for age  Memory:   within normal limits; recent and remote memory intact  Visuo-spatial:  not examined  Speech (Volume):  low  Speech:   normal; normal  Thought Process:  Coherent and Relevant  Though Content:  WNL; not suicidal and not homicidal  Orientation:   person, place, and time/date  Judgment:   Fair  Planning:   Poor  Affect:    Blunted and Lethargic  Mood:    Dysphoric  Insight:   Shallow  Intelligence:   normal   Medical History:   Past Medical History:  Diagnosis Date   CHF (congestive heart failure) (HCC)    Chronic Kidney Disease IV    Exertional shortness of breath    "before my last OR; I'm fine now" (09/26/2012)   GERD (gastroesophageal reflux disease)    High cholesterol    Hx of seasonal allergies    Hypertension    Myocardial infarction ([place])    Scrotal edema 01/30/2019   Stroke ([place]) 2009   memory loss   Type II diabetes mellitus ([place])          Patient Active Problem List   Diagnosis Date Noted   FUO (fever of unknown origin)    Slow transit constipation    Labile blood glucose    ESRD (end stage renal disease) on dialysis ([place])    Left pontine stroke ([place]) 01/25/2021   Debility 01/25/2021   Acute kidney injury ([place])    S/P bilateral BKA (below knee amputation) ([place])    Thrombocytopenia ([place])    Controlled type 2 diabetes mellitus with hyperglycemia ([place])     Acute CVA (cerebrovascular accident) ([place])    Seizure ([place]) 01/22/2021   Benign prostatic hyperplasia    Ventricular tachycardia, non-sustained    ESRD needing dialysis ([place])    Acute on chronic congestive heart failure ([place]) 01/11/2021   Phantom limb pain ([place]) 01/08/2021   History of diabetes mellitus 06/26/2020   Need for pneumococcal vaccination 06/26/2020   Goals of care, counseling/discussion 01/29/2019   CKD (chronic kidney disease), stage IV ([place]) 85/88/5027   Chronic systolic heart failure ([place]) 10/24/2012   Major depressive disorder, recurrent episode, moderate ([place]) 10/05/2012   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   H/O: CVA (cerebrovascular accident) 07/27/2012   Hypertension 07/27/2012   Normocytic anemia 07/27/2012     Psychiatric History:  Well patient denies any significant depression currently he was diagnosed with major depressive disorder dating back as far as 2014.  He is not currently taking any SSRI type medications and denies that he feels like he needs any now.  Family Med/Psych History: History reviewed. No pertinent family history.  Impression/DX:  [name] is a 70 year old male with history of hypertension, CAD status post CABG, CVA with short-term memory deficits, Type 2 diabetes, bilateral below-knee amputations, chronic kidney disease stage IV who is at times refused hemodialysis.  Patient was admitted on 01/11/2021 with nausea and vomiting and bilateral lower extremity edema.  Hemodialysis was initiated and treated for elevated cardiac enzymes that were felt to be due to demand ischemia and patient with cardiac renal syndrome.  Patient scheduled for discharge became unresponsive on 01/22/2021.  MRI brain showed small acute left pontine infarct and MRI revealed severe left and moderate right stenosis.  EEG was suggestive of mild diffuse encephalopathy without seizure.  Stroke was felt to be due to small/large vessel disease.  Patient was awake and alert sitting up  in his bed.  He was oriented x3 but rather blunted and lethargic.  Patient denied any mood disturbance and denied any neurological changes other than changes in his left hand motor function.  Discussed issues around his motivation and degree of active participation in therapeutic interventions.  Patient reports that he is aware of how that if he does not follow through and do his dialysis that he will "die."  Patient reports that he does intend on more participation as he does not want to have to be taking care of by others but his investment in the rehabilitation process remains in question.  Disposition/Plan:  Worked on coping and adjustment issues and tried to emphasize the need for full participation in the therapeutic efforts and his requirements for participating with hemodialysis.  Diagnosis:    Fever - Plan: DG CHEST PORT 1 VIEW, DG CHEST PORT 1 VIEW         Electronically Signed   _______________________ Ilean Skill, Psy.D. Clinical Neuropsychologist

## 2021-02-10 LAB — GLUCOSE, CAPILLARY
Glucose-Capillary: 146 mg/dL — ABNORMAL HIGH (ref 70–99)
Glucose-Capillary: 159 mg/dL — ABNORMAL HIGH (ref 70–99)
Glucose-Capillary: 68 mg/dL — ABNORMAL LOW (ref 70–99)
Glucose-Capillary: 125 mg/dL — ABNORMAL HIGH (ref 70–99)

## 2021-02-10 LAB — RENAL FUNCTION PANEL
Albumin: 2.5 g/dL — ABNORMAL LOW (ref 3.5–5.0)
Anion gap: 13 (ref 5–15)
BUN: 50 mg/dL — ABNORMAL HIGH (ref 8–23)
CO2: 22 mmol/L (ref 22–32)
Calcium: 8 mg/dL — ABNORMAL LOW (ref 8.9–10.3)
Chloride: 88 mmol/L — ABNORMAL LOW (ref 98–111)
Creatinine, Ser: 6.12 mg/dL — ABNORMAL HIGH (ref 0.61–1.24)
GFR, Estimated: 9 mL/min — ABNORMAL LOW (ref >60)
Glucose, Bld: 107 mg/dL — ABNORMAL HIGH (ref 70–99)
Phosphorus: 5 mg/dL — ABNORMAL HIGH (ref 2.5–4.6)
Potassium: 4.6 mmol/L (ref 3.5–5.1)
Sodium: 123 mmol/L — ABNORMAL LOW (ref 135–145)

## 2021-02-10 LAB — CBC
HCT: 25.6 % — ABNORMAL LOW (ref 39.0–52.0)
Hemoglobin: 8.4 g/dL — ABNORMAL LOW (ref 13.0–17.0)
MCH: 29.5 pg (ref 26.0–34.0)
MCHC: 32.8 g/dL (ref 30.0–36.0)
MCV: 89.8 fL (ref 80.0–100.0)
Platelets: 198 10*3/uL (ref 150–400)
RBC: 2.85 MIL/uL — ABNORMAL LOW (ref 4.22–5.81)
RDW: 17.8 % — ABNORMAL HIGH (ref 11.5–15.5)
WBC: 6.5 10*3/uL (ref 4.0–10.5)
nRBC: 0.3 % — ABNORMAL HIGH (ref 0.0–0.2)

## 2021-02-10 MED ORDER — HEPARIN SODIUM (PORCINE) 1000 UNIT/ML IJ SOLN
INTRAMUSCULAR | Status: AC
  Administered 2021-02-10: 1000 [IU]
  Filled 2021-02-10: qty 4

## 2021-02-10 NOTE — Progress Notes (Signed)
[facility] NEPHROLOGY PROGRESS NOTE  Assessment/ Plan:   # Deconditioning/mobility deficits: Following hospitalization and small acute left pontine infarct.  Continues to report making improvements with PT/OT and tentative discharge date set for 12/7.  #  End-stage renal disease: From underlying progressive chronic kidney disease and exacerbated by cardiorenal syndrome-started on hemodialysis that he is now getting on TTS schedule (placed to chair at [facility]).  Status post left upper arm AV graft on 01/19/2021 with ipsilateral arm swelling possibly from venous hypertension versus postoperative edema being treated with ultrafiltration and arm elevation.  LUE swelling is gradually improving, if no improvement then will call [facility] to have them take a look.  Open for HD today, next HD tentatively planned for 12/8 if still here.  # Anemia: Without overt blood loss.  Continue ESA, monitor hemoglobin.    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  #. Nutrition: Continue renal diet with ongoing renal multivitamin supplementation.  Continue nutritional supplementation as indicated by albumin levels.  # Hypertension: Blood pressure soft, not on antihypertensive.  Asymptomatic.  Monitor BP.    #Hyponatremia: Managed with dialysis, continue fluid restriction.  #Acute febrile illness on 12/3: The catheter site looks clean.  Clinically asymptomatic. Bcx 12/3 ngtd. Chest x-ray done yesterday without any acute finding.  Afebrile today.  Subjective: Seen and examined.  No acute events.  Objective Vital signs in last 24 hours: Vitals:   02/09/21 0534 02/09/21 1311 02/09/21 1943 02/10/21 0253  BP: 135/63 (!) 114/56 (!) 112/54 119/63  Pulse: 81 83 75 73  Resp: 18 17 18 16   Temp: 98 F (36.7 C) 98.8 F (37.1 C) 99.2 F (37.3 C) 98.8 F (37.1 C)  TempSrc:  Oral  Oral  SpO2: 100% 95% 100% 96%  Weight:    101.7 kg  Height:        Weight change: 0.4 kg  Intake/Output Summary (Last 24 hours) at 02/10/2021 0924 Last data filed at 02/09/2021 1325 Gross per 24 hour  Intake 236 ml  Output --  Net 236 ml       Labs: Basic Metabolic Panel: Recent Labs  Lab 02/03/21 1100 02/05/21 1211 02/07/21 1348  NA 131* 129* 128*  K 3.9 4.2 3.9  CL 95* 90* 92*  CO2 24 27 26   GLUCOSE 106* 157* 131*  BUN 38* 36* 30*  CREATININE 6.72* 5.74* 4.78*  CALCIUM 7.8* 7.6* 7.8*  PHOS 4.3 4.3 3.9   Liver Function Tests: Recent Labs  Lab 02/03/21 1100 02/05/21 1211 02/07/21 1348  ALBUMIN 2.6* 2.6* 2.4*   No results for input(s): LIPASE, AMYLASE in the last 168 hours. No results for input(s): AMMONIA in the last 168 hours. CBC: Recent Labs  Lab 02/03/21 1100 02/05/21 1211 02/07/21 1348  WBC 8.0 7.0 5.9  HGB 8.6* 8.3* 7.8*  HCT 26.5* 25.9* 24.3*  MCV 93.6 92.2 92.4  PLT 214 207 195   Cardiac Enzymes: No results for input(s): CKTOTAL, CKMB, CKMBINDEX, TROPONINI in the last 168 hours. CBG: Recent Labs  Lab 02/09/21 1125 02/09/21 1425 02/09/21 1651 02/09/21 2052 02/10/21 0506  GLUCAP 166* 122* 138* 125* 146*    Iron Studies: No results for input(s): IRON, TIBC, TRANSFERRIN, FERRITIN in the last 72 hours. Studies/Results: No results found.  Medications: Infusions:    Scheduled Medications:  (feeding supplement) PROSource Plus  30 mL Oral BID BM   amiodarone  200 mg Oral Daily   atorvastatin  80 mg Oral  QHS   Chlorhexidine Gluconate Cloth  6 each Topical Q0600   Chlorhexidine Gluconate Cloth  6 each Topical Q0600   Chlorhexidine Gluconate Cloth  6 each Topical Q0600   clopidogrel  75 mg Oral Daily   darbepoetin (ARANESP) injection - DIALYSIS  100 mcg Intravenous Q Thu-HD   feeding supplement (NEPRO CARB STEADY)  237 mL Oral BID BM   gabapentin  300 mg Oral QHS   insulin aspart  0-15 Units Subcutaneous TID WC   insulin aspart  4 Units Subcutaneous TID WC   mouth rinse  15 mL Mouth Rinse BID    multivitamin  1 tablet Oral QHS   pantoprazole  40 mg Oral Q0600   polyethylene glycol  17 g Oral BID    have reviewed scheduled and prn medications.  Physical Exam: General: Lying on bed comfortable, not in distress Heart:RRR, s1s2 nl Lungs: Clear b/l, no crackle Abdomen:soft, Non-tender, non-distended Extremities: Bilateral below-knee amputation Dialysis Access: Right IJ TDC, left upper extremity with AVG, the edema is gradually improving.  I can hear bruit and thrill but he still not ready to be cannulated.  Khylee Algeo 02/10/2021,9:24 AM  LOS: 16 days

## 2021-02-10 NOTE — Patient Care Conference (Signed)
Inpatient RehabilitationTeam Conference and Plan of Care Update Date: 02/10/2021   Time: 11:31 AM    Patient Name: [name]      Medical Record Number: [id number]  Date of Birth: 24-Sep-1950 Sex: Male         Room/Bed: 4M10C/4M10C-01 Payor Info: Payor: [employer] / Plan: [employer]HC MEDICARE / Product Type: *No Product type* /    Admit Date/Time:  01/25/2021  3:09 PM  Primary Diagnosis:  Left pontine stroke [facility])  Hospital Problems: Principal Problem:   Left pontine stroke ([place]) Active Problems:   Debility   FUO (fever of unknown origin)   Slow transit constipation   Labile blood glucose   ESRD (end stage renal disease) on dialysis [facility])    Expected Discharge Date: Expected Discharge Date: 02/11/21  Team Members Present: Physician leading conference: Dr. Courtney Heys Social Worker Present: Ovidio Kin, LCSW Nurse Present: Dorthula Nettles, RN PT Present: Ailene Rud, PT OT Present: Lillia Corporal, OT SLP Present: Lillie Columbia, SLP PPS Coordinator present : Gunnar Fusi, SLP     Current Status/Progress Goal Weekly Team Focus  Bowel/Bladder   Incontinent of bowel and oliguric. LMB: 12/5  Decrease incontinent episodes  Toilett Q 2 hours and PRN   Swallow/Nutrition/ Hydration   mod I dys 2  goal met  discharged   ADL's   Mod/Max LB ADL, Min/guard stand pivots, Max A sit <> stands from low surface but higher surface CGA/Min, refused toilet transfers  Min A overall  NA grad day   Mobility   gait with RW and CGA, 3" stairs CGA, refused 6" stairs but they are getting ramp installed, CGA transfers, as much as mod A for STS from bed but typically CGA, limited participation  SUP for bed mobility, CGA/ MinA for transfers, MinA for gait/ stairs, SUP for w/c mobility  d/c planning   Communication             Safety/Cognition/ Behavioral Observations  Supervision-Min A  sup A  discharged from [facility] for simple problem solving, memory. Min A for awareness    Pain   Patient denies pain  Remain free from pain  Assess pain Q shift and PRN   Skin   Bilateral BKA (2013)  Skin will remain free from new breakdown  Assess skin Q shift and PRN     Discharge Planning:  ex-wife here yesterday for education felt it went well. Aware of his need for 24/7 care, not sure will provide. Pt does his own thing and does not have a ramp in place but feels will get down the stairs for transport. Informed they will not assist him down the stairs before getting into WC van to go to HD   Team Discussion: Nauseated, constipated, refused Sorbitol. Had BM yesterday, nausea relieved. Incontinent bowel. Chest x-ray good. Resistant to therapy. Refused PT education. Not receptive to therapy and education. SLP discharged patient 02/09/21. Will most likely not meet goals. Patient on target to meet rehab goals: Min assist to contact guard goals.  *See Care Plan and progress notes for long and short-term goals.   Revisions to Treatment Plan:  Finalizing medications and discharge plans  Teaching Needs: Family education, medication management, bowel/bladder management, skin/wound care, transfer training, etc.  Current Barriers to Discharge: Decreased caregiver support, Home enviroment access/layout, Incontinence, Wound care, Lack of/limited family support, Weight, Hemodialysis, Weight bearing restrictions, Medication compliance, and Behavior  Possible Resolutions to Barriers: Family education Order DME Follow up PT/OT Family to  install ramp Set up transportation for HD     Medical Summary Current Status: Pt refused to let renal navigator to contact family about HD; refusing regular therapy- nonreceptive for education; incontinent of bowels- needs ramp- they don't have one; won't get one.  Barriers to Discharge: Behavior;Decreased family/caregiver support;Hemodialysis;Home enviroment access/layout;Incontinence;Weight;Weight bearing restrictions;Medication compliance   Barriers to Discharge Comments: barriers- pt refuses therapy- Min-CGA- refused to work on toileting. Possible Resolutions to [employer]: refusing to walk since last week- won't participate- refused PT education- refused ADL education with wife as well; d/c;d from SLP- refusing- d/c tomorrow- unable to drive-   Continued Need for Acute Rehabilitation Level of Care: The patient requires daily medical management by a physician with specialized training in physical medicine and rehabilitation for the following reasons: Direction of a multidisciplinary physical rehabilitation program to maximize functional independence : Yes Medical management of patient stability for increased activity during participation in an intensive rehabilitation regime.: Yes Analysis of laboratory values and/or radiology reports with any subsequent need for medication adjustment and/or medical intervention. : Yes   I attest that I was present, lead the team conference, and concur with the assessment and plan of the team.   Cristi Loron 02/10/2021, 4:05 PM

## 2021-02-10 NOTE — Progress Notes (Signed)
Occupational Therapy Session Note  Patient Details  Name: WILMOT QUEVEDO MRN: [id number] Date of Birth: 1950/11/26  Today's Date: 02/10/2021 OT Individual Time: 9390-3009 OT Individual Time Calculation (min): 55 min  and Today's Date: 02/10/2021 OT Missed Time: 20 Minutes Missed Time Reason: Other (comment) (request a few minutes for breakfast)   Short Term Goals: Week 1:  OT Short Term Goal 1 (Week 1): PT will complete BSC or toilet transfer with 1 assist and LRAD OT Short Term Goal 1 - Progress (Week 1): Progressing toward goal OT Short Term Goal 2 (Week 1): Pt will don B prosthetics with supervision assistance OT Short Term Goal 2 - Progress (Week 1): Progressing toward goal OT Short Term Goal 3 (Week 1): Pt will sit EOB ~20 minutes during self care activity before returning to bed to rest in order to increase functional activity tolerance OT Short Term Goal 3 - Progress (Week 1): Progressing toward goal Week 2:  OT Short Term Goal 1 (Week 2): Pt will perform toilet/BSC transfer with CGA and LRAD OT Short Term Goal 2 (Week 2): Pt will don B prosthetics with Supervision OT Short Term Goal 3 (Week 2): Pt will perform LB dress with AE PRN with Min A OT Short Term Goal 4 (Week 2): Pt will perform ADL/task of choice for 10 minutes with no rest break to improve global endurance   Skilled Therapeutic Interventions/Progress Updates:    Pt greeted at time of session semireclined in bed stating "why do you have to come so early?" And relayed to the pt that 2/2 dialysis today pt has all therapies in the morning. Pt stating he has not eaten breakfast, OT able to locate breakfast tray and provided to the patient. Pt agreeable to get OOB to eat breakfast. Semireclined in bed, pt donning R prosthetic base, having difficulty with L and requested assistance. Sitting EOB, pt requesting assistance to don remaining prosthetics despite stating that he can do it at home and encouraged to do so, therapist  assisted for time. Donned pants Mod/Max A sit > stand at EOB, stand pivot > wheelchair Min guard with RW. Note pt stating throughout he could direct his care with ex wife and caregiver [name]. Set up in wheelchair to eat breakfast per pt request, stating he cannot participate in therapy session until has eaten breakfast and letting it "settle." Missed 20 mins of OT. Resumed session and pt finished breakfast and med pass. Set up at sink and performed UB bathe and dress with Supervision. Discussion throughout regarding set up for this at home, directing his care with caregivers, etc. At end of session pt in wheelchair alarm on call bell in reach and LUE elevated.   Therapy Documentation Precautions:  Precautions Precautions: Fall, Other (comment) Precaution Comments: bilateral BKA Required Braces or Orthoses: Other Brace Other Brace: Bilateral prostheses Restrictions Weight Bearing Restrictions: No     Therapy/Group: Individual Therapy  Viona Gilmore 02/10/2021, 7:13 AM

## 2021-02-10 NOTE — Plan of Care (Signed)
Patient is non-receptive to education and nursing needs.

## 2021-02-10 NOTE — Progress Notes (Signed)
Occupational Therapy Discharge Summary  Patient Details  Name: [name] MRN: [id number] Date of Birth: 1950-07-04    Patient has met 3 of 7 long term goals due to improved activity tolerance, improved balance, postural control, functional use of  LEFT upper extremity, and improved coordination.  Patient to discharge at overall Mod Assist level.  Patient's care partner is independent to provide the necessary physical assistance at discharge.  The pt has been resistant to adaptive and compensatory techniques, resisting education and missing time during several therapy sessions. The pt has refused all toileting tasks including practice, but is likely Mod/Max A for toileting as simulated with LB dressing. Pt insists on donning prosthetics first prior to LB dressing, which makes the pt require increased assistance with threading. Pt is Min/CGA with stand pivot transfers to/from bed <> wheelchair and other surfaces. Attempted family education with ex wife [name] whom the patient lives with and pt refusing all ADL education with caregiver, but did perform some mobility and transfers. Verbally reviewed with ex wife assistance that pt will need and verbalized understanding.   Reasons goals not met: Pt requires Mod-Max assist with LB dressing and toileting, Min A for LB bathing at sink level.   Recommendation:  Patient will benefit from ongoing skilled OT services in home health setting to continue to advance functional skills in the area of BADL and Reduce care partner burden.  Equipment: BSC  Reasons for discharge: treatment goals met and discharge from hospital  Patient/family agrees with progress made and goals achieved: Yes  OT Discharge Precautions/Restrictions  Precautions Precautions: Fall Precaution Comments: bilateral BKA Required Braces or Orthoses: Other Brace Other Brace: Bilateral prostheses Restrictions Weight Bearing Restrictions: No Pain Pain Assessment Pain Scale:  0-10 Pain Score: 0-No pain ADL ADL Eating: Set up Grooming: Setup Where Assessed-Grooming: Edge of bed Upper Body Bathing: Supervision/safety Where Assessed-Upper Body Bathing: Edge of bed Lower Body Bathing: Moderate assistance, Minimal assistance Where Assessed-Lower Body Bathing: Sitting at sink Upper Body Dressing: Contact guard Where Assessed-Upper Body Dressing: Edge of bed Lower Body Dressing: Maximal assistance Toileting: Not assessed (likely max A, pt has refused toileting, simulated task) Toilet Transfer: Moderate assistance Tub/Shower Transfer: Not assessed Vision Baseline Vision/History: 1 Wears glasses Patient Visual Report: No change from baseline Perception  Perception: Within Functional Limits Praxis Praxis: Intact Cognition Overall Cognitive Status: History of cognitive impairments - at baseline Arousal/Alertness: Awake/alert Orientation Level: Oriented X4 Year: 2022 Month: December Day of Week: Correct Memory: Impaired Immediate Memory Recall: Sock;Blue;Bed Memory Recall Sock: Without Cue Memory Recall Blue: Without Cue Memory Recall Bed: Without Cue Awareness: Impaired Problem Solving: Impaired Reasoning: Impaired Decision Making: Impaired Safety/Judgment: Impaired Sensation Sensation Light Touch: Appears Intact Coordination Gross Motor Movements are Fluid and Coordinated: Yes Fine Motor Movements are Fluid and Coordinated: No Finger Nose Finger Test: dysmetria in end ranges bilaterally Lt>Rt Motor  Motor Motor: Hemiplegia (mild L hemi) Motor - Skilled Clinical Observations: mild L hemipareisis, lethargic and slow to attain balance in stance Mobility  Bed Mobility Supine to Sit: Contact Guard/Touching assist Sitting - Scoot to Edge of Bed: Contact Guard/Touching assist Transfers Sit to Stand: Minimal Assistance - Patient > 75% Stand to Sit: Minimal Assistance - Patient > 75%  Trunk/Postural Assessment  Cervical Assessment Cervical  Assessment: Exceptions to [facility] Thoracic Assessment Thoracic Assessment: Exceptions to [facility] Lumbar Assessment Lumbar Assessment: Exceptions to [facility] Postural Control Postural Control: Deficits on evaluation  Balance Balance Balance Assessed: Yes Static Sitting Balance Static Sitting - Balance Support:  Bilateral upper extremity supported;Feet unsupported Static Sitting - Level of Assistance: 6: Modified independent (Device/Increase time) Dynamic Sitting Balance Dynamic Sitting - Balance Support: No upper extremity supported;During functional activity Dynamic Sitting - Level of Assistance: 5: Stand by assistance Dynamic Sitting - Balance Activities: Lateral lean/weight shifting;Forward lean/weight shifting;Reaching for objects Static Standing Balance Static Standing - Balance Support: Bilateral upper extremity supported Static Standing - Level of Assistance: 5: Stand by assistance Dynamic Standing Balance Dynamic Standing - Balance Support: Bilateral upper extremity supported Dynamic Standing - Level of Assistance: 4: Min assist Dynamic Standing - Balance Activities: Lateral lean/weight shifting;Forward lean/weight shifting;Reaching for objects Extremity/Trunk Assessment RUE Assessment RUE Assessment: Within Functional Limits LUE Assessment LUE Assessment: Exceptions to [facility] Active Range of Motion (AROM) Comments: Shoulder ROM ~90 degrees, edemateous but improved, mild L hemi but functional during ADL   Viona Gilmore 02/10/2021, 1:10 PM

## 2021-02-10 NOTE — Progress Notes (Signed)
PROGRESS NOTE   Subjective/Complaints:  Pt reports nausea improved since had BM- - LBM finally yesterday- he went.   Also itching in R hand.  Says he wants his meds spread out some.  Will d/w nursing.    ROS:  Pt denies SOB, abd pain, CP, N/V/C/D, and vision changes   Objective:   No results found. Recent Labs    02/07/21 1348  WBC 5.9  HGB 7.8*  HCT 24.3*  PLT 195    Recent Labs    02/07/21 1348  NA 128*  K 3.9  CL 92*  CO2 26  GLUCOSE 131*  BUN 30*  CREATININE 4.78*  CALCIUM 7.8*     Intake/Output Summary (Last 24 hours) at 02/10/2021 0831 Last data filed at 02/09/2021 1325 Gross per 24 hour  Intake 354 ml  Output --  Net 354 ml        Physical Exam: Vital Signs Blood pressure 119/63, pulse 73, temperature 98.8 F (37.1 C), temperature source Oral, resp. rate 16, height 5\' 8"  (1.727 m), weight 101.7 kg, SpO2 96 %.  Tm -99.2 General: awake, alert, irritable;  NAD HENT: conjugate gaze; oropharynx moist CV: regular rate; no JVD Pulmonary: CTA B/L; no W/R/R- good air movement GI: soft, NT, ND, (+)BS- less distended; hypoactive- waiting for breakfast Psychiatric: irritable Neurological: alert- talking  Neuro: Alert Dysarthria, stable Motor: Grossly 4/5, left weaker than right, unchanged  Assessment/Plan: 1. Functional deficits which require 3+ hours per day of interdisciplinary therapy in a comprehensive inpatient rehab setting. Physiatrist is providing close team supervision and 24 hour management of active medical problems listed below. Physiatrist and rehab team continue to assess barriers to discharge/monitor patient progress toward functional and medical goals  Care Tool:  Bathing    Body parts bathed by patient: Chest, Abdomen, Face, Right arm, Left arm, Left upper leg, Right upper leg   Body parts bathed by helper: Buttocks Body parts n/a: Right lower leg, Left lower leg    Bathing assist Assist Level: Moderate Assistance - Patient 50 - 74%     Upper Body Dressing/Undressing Upper body dressing   What is the patient wearing?: Pull over shirt    Upper body assist Assist Level: Minimal Assistance - Patient > 75%    Lower Body Dressing/Undressing Lower body dressing    Lower body dressing activity did not occur: Refused What is the patient wearing?: Pants     Lower body assist Assist for lower body dressing: Moderate Assistance - Patient 50 - 74%     Toileting Toileting Toileting Activity did not occur Landscape architect and hygiene only): N/A (no void or bm)  Toileting assist       Transfers Chair/bed transfer  Transfers assist     Chair/bed transfer assist level: Minimal Assistance - Patient > 75%     Locomotion Ambulation   Ambulation assist   Ambulation activity did not occur: Safety/medical concerns  Assist level: Minimal Assistance - Patient > 75% Assistive device: Walker-rolling Max distance: 100 ft   Walk 10 feet activity   Assist  Walk 10 feet activity did not occur: Safety/medical concerns  Assist level: Minimal Assistance - Patient > 75% Assistive  device: Walker-rolling   Walk 50 feet activity   Assist Walk 50 feet with 2 turns activity did not occur: Safety/medical concerns  Assist level: Minimal Assistance - Patient > 75% Assistive device: Walker-rolling    Walk 150 feet activity   Assist Walk 150 feet activity did not occur: Safety/medical concerns         Walk 10 feet on uneven surface  activity   Assist Walk 10 feet on uneven surfaces activity did not occur: Safety/medical concerns         Wheelchair     Assist Is the patient using a wheelchair?: Yes Type of Wheelchair: Manual    Wheelchair assist level: Total Assistance - Patient < 25% Max wheelchair distance: 30 ft    Wheelchair 50 feet with 2 turns activity    Assist        Assist Level: Moderate Assistance -  Patient 50 - 74%   Wheelchair 150 feet activity     Assist      Assist Level: Total Assistance - Patient < 25%   Blood pressure 119/63, pulse 73, temperature 98.8 F (37.1 C), temperature source Oral, resp. rate 16, height 5\' 8"  (1.727 m), weight 101.7 kg, SpO2 96 %.  Medical Problem List and Plan: 1.  Deficits with mobility, self-care, transfers, endurance secondary to L pontine stroke   Continue CIR- PT, OT and SLP  D/c 12/7- team conference today to complete d/c plans.  2.  Antithrombotics: -DVT/anticoagulation:  Mechanical: Sequential compression devices, below knee Bilateral lower extremities  12/2- refuses SQ Heparin.              -antiplatelet therapy: DAPT X 3, changed to Plavix alone on 11/22 3. Pain Management: Tylenol prn.  4. Mood: LCSW to follow for evaluation and support.              -antipsychotic agents: N/A 5. Neuropsych: This patient is ?fully capable of making decisions on his own behalf. 6. Skin/Wound Care: Routine pressure relief measures.  7. Fluids/Electrolytes/Nutrition: Strict I/O. 1200 cc FR. Renal diet' 8. 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 Demand ischemia/NSVT: On amiodarone daily 10. Thrombocytopenia: Resolved 11. Anemia of chronic disease: ON Aranesp weekly for supplement.              Hemoglobin 8.3 on 12/1 12. T2DM with hyperglycemia: Hgb  A1C-6.8.  Monitor BS ac/hs.  --Continue SSI as well as 4 units novolog TID with meals 12/6- CBG's doing better- 86-146- con't regimen Monitor with increased mobility 13. Small acute left pontine infarct             See #2 14. S/p b/l BKA- chronic             Continue to monitor- pt states he was amb with Bilateral prosthetics  15. Impaired cognition- mild to moderate with impulsiveness and poor safety awareness- will con't SLP and therapies, however this  is pt's biggest limitation.  16. Constipation  Sorbitol being ordered periodically  MiraLAX ordered on 12/3  12/6- LBM yesterday-c on't regimen 17/ Sleepiness/sedation Improving 18. Fever- low grade <100 for last 48h - CXR c/w atelectasis, order IS 12/6- Last temp was 99.2- no Sx's of illness- con't IS.   LOS: 16 days A FACE TO FACE EVALUATION  WAS PERFORMED  Aurilla Coulibaly 02/10/2021, 8:31 AM

## 2021-02-10 NOTE — Progress Notes (Signed)
Met with pt at bedside. Discussed out-pt HD arrangements again to refresh memory from last discussion. Pt aware that he needs to arrive at clinic on Thursday at 10:30 to complete paperwork. Reminded pt that he or family member needs to contact [facility] today if possible to schedule appt for Thursday. Pt voices understanding. Pt's out-pt HD info documented on AVS and resource sheet provided to pt as well with all arrangements noted. Inquired if pt would like navigator to contact any family member to discuss arrangements and pt declined. Pt is supposed to d/c tomorrow. Clinic aware pt to start on Thursday. Will contact [facility] staff to request orders. Will assist as needed.  Melven Sartorius Renal Navigator [phone]

## 2021-02-10 NOTE — Progress Notes (Signed)
Patient ID: [name], male   DOB: [birth year], 70 y.o.   MRN: [id number] Met with pt to discuss team conference goals-some met some not met due to his participation with therapies. He states: " There is times I don't want to I'm tired." Pt feels ready to go home tomorrow and he reports his son is coming to transport him home. He has no other concerns and feels he will do more at home cuz he will feel like it. Pt made aware of the concerns team has of him falling at home and safety risk. He feels he can do what he needs to do for himself. Pt is somewhat unrealistic regarding his abilities.

## 2021-02-10 NOTE — Progress Notes (Signed)
Occupational Therapy Session Note  Patient Details  Name: [name] MRN: [id number] Date of Birth: Mar 19, 1950  Today's Date: 02/10/2021 OT Individual Time: 0900-1000 OT Individual Time Calculation (min): 60 min    Short Term Goals: Week 1:  OT Short Term Goal 1 (Week 1): PT will complete BSC or toilet transfer with 1 assist and LRAD OT Short Term Goal 1 - Progress (Week 1): Progressing toward goal OT Short Term Goal 2 (Week 1): Pt will don B prosthetics with supervision assistance OT Short Term Goal 2 - Progress (Week 1): Progressing toward goal OT Short Term Goal 3 (Week 1): Pt will sit EOB ~20 minutes during self care activity before returning to bed to rest in order to increase functional activity tolerance OT Short Term Goal 3 - Progress (Week 1): Progressing toward goal Week 2:  OT Short Term Goal 1 (Week 2): Pt will perform toilet/BSC transfer with CGA and LRAD OT Short Term Goal 2 (Week 2): Pt will don B prosthetics with Supervision OT Short Term Goal 3 (Week 2): Pt will perform LB dress with AE PRN with Min A OT Short Term Goal 4 (Week 2): Pt will perform ADL/task of choice for 10 minutes with no rest break to improve global endurance Week 3:     Skilled Therapeutic Interventions/Progress Updates:    Patient in room seated at w/c LOF upon arrival.  Patient completed functional transfer from sit to stand, maintaining standing for a count of 10 using the RW for static balance.  The pt completed 3 sit to stands with good safety awareness for safe and independent function during transfers incorporating the RW for balance. The pt was able to transfer from the w/c to the recliner with [place] secondary to the height of the recliner in correlation to the w/c.  The pt was able to maneuver the w/c for short distance with MinA and initial vc's.  The pt was instructed in the use of the long handle sponge for additional reach with back and bottom.  The pt worked on sitting balance to improve  active mobility and safety by completing exercises involving oppositional force to improve trunk strength.  The pt remained seated at w/c LOF with his bedside table within reach with his alarm and call bell available with all additional needs addressed. The patient had no c/o pain at the time of treatment.   Therapy Documentation Precautions:  Precautions Precautions: Fall, Other (comment) Precaution Comments: bilateral BKA Required Braces or Orthoses: Other Brace Other Brace: Bilateral prostheses Restrictions Weight Bearing Restrictions: No General: General OT Amount of Missed Time: 20 Minutes PT Missed Treatment Reason: Patient unwilling to participate Vital Signs:   Pain:    Vision   Perception    Praxis   Balance   Exercises:   Other Treatments:     Therapy/Group: Individual Therapy  Yvonne Kendall 02/10/2021, 12:35 PM

## 2021-02-10 NOTE — Progress Notes (Signed)
Inpatient Rehabilitation Care Coordinator Discharge Note   Patient Details  Name: [name] MRN: [id number] Date of Birth: 06/09/1950   Discharge location: HOME WITH EX-WIFE WHO CAN PROVIDE 24/7 CARE  Length of Stay:  17 DAYS  Discharge activity level: MIN LEVEL  Home/community participation: ACTIVE  Patient response IR:SWNIOE Literacy - How often do you need to have someone help you when you read instructions, pamphlets, or other written material from your doctor or pharmacy?: Never  Patient response VO:JJKKXF Isolation - How often do you feel lonely or isolated from those around you?: Never  Services provided included: MD, RD, PT, OT, SLP, RN, CM, Pharmacy, SW, Neuropsych  Financial Services:  Charity fundraiser Utilized: Private Insurance [employer]  Choices offered to/list presented to: PT AND EX-WIFE  Follow-up services arranged:  Home Health, DME, Patient/Family has no preference for HH/DME agencies [facility]-PT,OT,SW    DME : [facility] AND 3 IN 1    Patient response to transportation need: Is the patient able to respond to transportation needs?: Yes In the past 12 months, has lack of transportation kept you from medical appointments or from getting medications?: No In the past 12 months, has lack of transportation kept you from meetings, work, or from getting things needed for daily living?: No    Comments (or additional information): [name] WAS [place] ATTENDED THERAPIES BUT NOT [place] 24/7 CARE. PT DID NOT WANT HER TO PARTICIPATE IN ADL SESSION AND NEEDS ASSIST. PT UNREALISTICALLY FEELS CAN DO MORE ONCE HOME AND WILL BE HIGH FALL RISK. SET UP WITH [facility] AND GAVE NUMBER TO CALL TO RESERVE RIDE FOR HD APPOINTMENTS. HAS TWO STEPS IN THE BACK AND HAS NO RAMP FEELS CAN GET UP AND DOWN THEM. AWARE [facility] WILL NOT ASSIST DOWN THE STAIRS AT HOME. SON'S AWARE HE WILL NEED A RAMP AND TOLD NOT TO DRIVE WHEN GETS  HOME. HIGH RISK TO BE RE-ADMITTED TO [place]. COULD HAVE MET HIS GOALS IF HE PARTICIPATED MORE WITH THERAPIES. CALLED [facility] TO RESERVE A RIDE FOR HIM TOMORROW TO BE AT [facility] BY 10;30 FOR FIRST OP-HD APPOINTMENT.  Patient/Family verbalized understanding of follow-up arrangements:  Yes  Individual responsible for coordination of the follow-up plan: [name]-EX-WIFE [phone]  Confirmed correct DME delivered: [name] 02/10/2021    [name], [name]

## 2021-02-10 NOTE — Progress Notes (Signed)
Physical Therapy Discharge Summary  Patient Details  Name: [name] MRN: [id number] Date of Birth: Mar 07, 1951  Today's Date: 02/10/2021 PT Individual Time: 2671-2458 PT Individual Time Calculation (min): 45 min    Patient has met 5 of 9 long term goals due to improved activity tolerance and ability to compensate for deficits.  Patient to discharge at a wheelchair level [place] with RW   Patient's care partner is independent to provide the necessary physical assistance at discharge. Pt requires min A overall for Sit to stand and transfers. Pt refused most education and techniques and often did not participate with therapy. Pt also refused family education when his caregiver was present.   Reasons goals not met: Pt refused education and refused to attempt stairs and w/c propulsion.  Recommendation:  Patient will benefit from ongoing skilled PT services in home health setting to continue to advance safe functional mobility, address ongoing impairments in strength, functional mobility, and minimize fall risk.  Equipment: 20x18 w/c  Reasons for discharge: discharge from hospital  Patient/family agrees with progress made and goals achieved: Yes  Skilled Therapeutic Interventions/Progress Updates:  Pt seated in w/c on arrival and agreeable to therapy. No complaint of pain. Pt transported to therapy gym for time management and energy conservation. Pt required extended rest breaks throughout and had limited participation during session. Pt navigated ramp with CGA and constant VC for safety. Pt required rest break before ambulating back down ramp. Pt performed turn and sit of portion of car transfer with CGA but was unable to pull legs into car d/t height of car floor. Likely mod A in pt's car. Pt then requested ro return to room and return to bed. Pt returned to bed with min A and required assist to doff legs in bed set up per home set up. Sit>supine with CGA. Pt remained in bed and was left  with all needs in reach and alarm active.   PT Discharge Precautions/Restrictions Precautions Precautions: Fall Precaution Comments: bilateral BKA Required Braces or Orthoses: Other Brace Other Brace: Bilateral prostheses Restrictions Weight Bearing Restrictions: No  Pain Pain Assessment Pain Scale: 0-10 Pain Score: 0-No pain Pain Interference Pain Interference Pain Effect on Sleep: 1. Rarely or not at all Pain Interference with Therapy Activities: 1. Rarely or not at all Pain Interference with Day-to-Day Activities: 1. Rarely or not at all Vision/Perception  Vision - History Ability to See in Adequate Light: 1 Impaired Perception Perception: Within Functional Limits Praxis Praxis: Intact  Cognition Overall Cognitive Status: History of cognitive impairments - at baseline Arousal/Alertness: Awake/alert Orientation Level: Oriented X4 Year: 2022 Month: December Day of Week: Correct Memory: Appears intact Memory Impairment: Retrieval deficit;Storage deficit Immediate Memory Recall: Sock;Blue;Bed Memory Recall Sock: Without Cue Memory Recall Blue: Without Cue Memory Recall Bed: Without Cue Awareness: Impaired Awareness Impairment: Emergent impairment Problem Solving: Impaired Reasoning: Impaired Decision Making: Impaired Safety/Judgment: Impaired Comments: Limited cognitive assessment obtained due to minimal participation. Pt appeared functional for basic tasks Sensation Sensation Light Touch: Appears Intact Additional Comments: Pt's skin extremely dry with large flakes on BLE Coordination Gross Motor Movements are Fluid and Coordinated: Yes Fine Motor Movements are Fluid and Coordinated: No Finger Nose Finger Test: dysmetria in end ranges bilaterally Lt>Rt Motor  Motor Motor: Hemiplegia Motor - Skilled Clinical Observations: mild L hemipareisis, lethargic and slow to attain balance in stance Motor - Discharge Observations: mild improvements in mobility   Mobility Bed Mobility Supine to Sit: Contact Guard/Touching assist Sitting - Scoot to [employer]  of Bed: Contact Guard/Touching assist Sit to Supine: Contact Guard/Touching assist Transfers Transfers: Sit to Stand;Stand to Sit;Stand Pivot Transfers Sit to Stand: Minimal Assistance - Patient > 75% Stand to Sit: Contact Guard/Touching assist Stand Pivot Transfers: Contact Guard/Touching assist Stand Pivot Transfer Details: Tactile cues for weight shifting;Verbal cues for sequencing;Verbal cues for technique;Verbal cues for precautions/safety;Verbal cues for safe use of DME/AE Transfer (Assistive device): Rolling walker Locomotion  Gait Ambulation: Yes Gait Assistance: Contact Guard/Touching assist Gait Distance (Feet): 200 Feet Assistive device: Rolling walker Gait Gait: Yes Gait Pattern: Impaired Gait Pattern: Left foot flat;Right foot flat;Decreased step length - right;Decreased step length - left Stairs / Additional Locomotion Stairs: Yes Stairs Assistance: Minimal Assistance - Patient > 75% Stair Management Technique: Two rails Number of Stairs: 8 Height of Stairs: 3 Ramp: Nurse, mental health Mobility: Yes Wheelchair Assistance: Chartered loss adjuster: Both upper extremities Wheelchair Parts Management: Needs assistance Distance: 50 ft  Trunk/Postural Assessment  Cervical Assessment Cervical Assessment: Exceptions to [facility] Thoracic Assessment Thoracic Assessment: Exceptions to [facility] Lumbar Assessment Lumbar Assessment: Exceptions to [facility] Postural Control Postural Control: Deficits on evaluation  Balance Balance Balance Assessed: Yes Static Sitting Balance Static Sitting - Balance Support: Bilateral upper extremity supported;Feet unsupported Static Sitting - Level of Assistance: 6: Modified independent (Device/Increase time) Dynamic Sitting Balance Dynamic Sitting - Balance Support: No upper extremity  supported;During functional activity Dynamic Sitting - Level of Assistance: 5: Stand by assistance Dynamic Sitting - Balance Activities: Lateral lean/weight shifting;Forward lean/weight shifting;Reaching for objects Sitting balance - Comments: pt dependent on BUE support,demos posterior bias and mild L lateral lean with fatigue Static Standing Balance Static Standing - Balance Support: Bilateral upper extremity supported Static Standing - Level of Assistance: 5: Stand by assistance Dynamic Standing Balance Dynamic Standing - Balance Support: Bilateral upper extremity supported Dynamic Standing - Level of Assistance: 4: Min assist Dynamic Standing - Balance Activities: Lateral lean/weight shifting;Forward lean/weight shifting;Reaching for objects Extremity Assessment  RUE Assessment RUE Assessment: Within Functional Limits LUE Assessment LUE Assessment: Exceptions to [facility] Active Range of Motion (AROM) Comments: Shoulder ROM ~90 degrees, edemateous but improved, mild L hemi but functional during ADL RLE Assessment RLE Assessment: Exceptions to [facility] General Strength Comments: Grossly 4- to 4/5 LLE Assessment LLE Assessment: Exceptions to [facility] General Strength Comments: Grossly 4- to 4/5    Kristyana Notte C Natalina Wieting 02/10/2021, 1:16 PM

## 2021-02-11 LAB — GLUCOSE, CAPILLARY: Glucose-Capillary: 136 mg/dL — ABNORMAL HIGH (ref 70–99)

## 2021-02-11 MED ORDER — POLYETHYLENE GLYCOL 3350 17 G PO PACK: 17.0000 g | PACK | Freq: Two times a day (BID) | ORAL | 0 refills | Status: AC

## 2021-02-11 MED ORDER — ACETAMINOPHEN 325 MG PO TABS: 325.0000 mg | ORAL_TABLET | ORAL | Status: AC | PRN

## 2021-02-11 MED ORDER — PROCHLORPERAZINE MALEATE 5 MG PO TABS: 5.0000 mg | ORAL_TABLET | Freq: Four times a day (QID) | ORAL | 0 refills | Status: AC | PRN

## 2021-02-11 MED ORDER — CLOPIDOGREL BISULFATE 75 MG PO TABS: 75.0000 mg | ORAL_TABLET | Freq: Every day | ORAL | 0 refills | Status: AC

## 2021-02-11 MED ORDER — OXYCODONE-ACETAMINOPHEN 5-325 MG PO TABS: 1.0000 | ORAL_TABLET | Freq: Every day | ORAL | 0 refills | Status: AC | PRN

## 2021-02-11 MED ORDER — RENA-VITE PO TABS: 1.0000 | ORAL_TABLET | Freq: Every day | ORAL | 0 refills | Status: AC

## 2021-02-11 MED ORDER — MELATONIN 5 MG PO TABS: 5.0000 mg | ORAL_TABLET | Freq: Every evening | ORAL | 0 refills | Status: AC | PRN

## 2021-02-11 MED ORDER — AMIODARONE HCL 200 MG PO TABS: 200.0000 mg | ORAL_TABLET | Freq: Every day | ORAL | 0 refills | Status: AC

## 2021-02-11 MED ORDER — ATORVASTATIN CALCIUM 80 MG PO TABS: 80.0000 mg | ORAL_TABLET | Freq: Every day | ORAL | 0 refills | Status: AC

## 2021-02-11 NOTE — Progress Notes (Addendum)
[facility] NEPHROLOGY PROGRESS NOTE  Assessment/ Plan:   # Deconditioning/mobility deficits: Following hospitalization and small acute left pontine infarct.  Continues to report making improvements with PT/OT and tentative discharge date set for 12/7.  #  End-stage renal disease: From underlying progressive chronic kidney disease and exacerbated by cardiorenal syndrome-started on hemodialysis that he is now getting on TTS schedule (placed to chair at [facility]).  Status post left upper arm AV graft on 01/19/2021 with ipsilateral arm swelling possibly from venous hypertension versus postoperative edema being treated with ultrafiltration and arm elevation.  LUE swelling is gradually improving, if no improvement then will call [facility] to have them take a look or can do it as an outpatient.  -next HD 12/8 if still here  # Anemia: Without overt blood loss.  Continue ESA, monitor hemoglobin.    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  #. Nutrition: Continue renal diet with ongoing renal multivitamin supplementation.  Continue nutritional supplementation as indicated by albumin levels.  # Hypertension: Blood pressure soft, not on antihypertensive.  Asymptomatic.  Monitor BP.    #Hyponatremia: Managed with dialysis, continue fluid restriction. 137na bath, Uf'ing as tolerated  #Acute febrile illness on 12/3: The catheter site looks clean.  Clinically asymptomatic. Bcx 12/3 ngtd. Chest x-ray done without any acute finding.  Afebrile today.WBC WNL today  Subjective: Seen and examined.  No acute events. Open for d/c today. Tolerated HD yesterday, net uf 2.5L  Objective Vital signs in last 24 hours: Vitals:   02/10/21 1900 02/10/21 1957 02/11/21 0500 02/11/21 0611  BP: (!) 110/56 (!) 117/56  (!) 123/97  Pulse: 75 76  77  Resp: 16 20  18   Temp: 98.9 F (37.2 C) 99.6 F (37.6 C)  98 F (36.7 C)  TempSrc: Oral     SpO2: 98%  99%  97%  Weight:   100.4 kg   Height:       Weight change: 0 kg  Intake/Output Summary (Last 24 hours) at 02/11/2021 0834 Last data filed at 02/10/2021 1829 Gross per 24 hour  Intake 220 ml  Output 2500 ml  Net -2280 ml       Labs: Basic Metabolic Panel: Recent Labs  Lab 02/05/21 1211 02/07/21 1348 02/10/21 1431  NA 129* 128* 123*  K 4.2 3.9 4.6  CL 90* 92* 88*  CO2 27 26 22   GLUCOSE 157* 131* 107*  BUN 36* 30* 50*  CREATININE 5.74* 4.78* 6.12*  CALCIUM 7.6* 7.8* 8.0*  PHOS 4.3 3.9 5.0*   Liver Function Tests: Recent Labs  Lab 02/05/21 1211 02/07/21 1348 02/10/21 1431  ALBUMIN 2.6* 2.4* 2.5*   No results for input(s): LIPASE, AMYLASE in the last 168 hours. No results for input(s): AMMONIA in the last 168 hours. CBC: Recent Labs  Lab 02/05/21 1211 02/07/21 1348 02/10/21 1431  WBC 7.0 5.9 6.5  HGB 8.3* 7.8* 8.4*  HCT 25.9* 24.3* 25.6*  MCV 92.2 92.4 89.8  PLT 207 195 198   Cardiac Enzymes: No results for input(s): CKTOTAL, CKMB, CKMBINDEX, TROPONINI in the last 168 hours. CBG: Recent Labs  Lab 02/10/21 0506 02/10/21 1142 02/10/21 1858 02/10/21 2123 02/11/21 0617  GLUCAP 146* 159* 68* 125* 136*    Iron Studies: No results for input(s): IRON, TIBC, TRANSFERRIN, FERRITIN in the last 72 hours. Studies/Results: No results found.  Medications: Infusions:    Scheduled Medications:  (feeding supplement) PROSource Plus  30 mL Oral BID BM  amiodarone  200 mg Oral Daily   atorvastatin  80 mg Oral QHS   Chlorhexidine Gluconate Cloth  6 each Topical Q0600   Chlorhexidine Gluconate Cloth  6 each Topical Q0600   Chlorhexidine Gluconate Cloth  6 each Topical Q0600   clopidogrel  75 mg Oral Daily   darbepoetin (ARANESP) injection - DIALYSIS  100 mcg Intravenous Q Thu-HD   feeding supplement (NEPRO CARB STEADY)  237 mL Oral BID BM   gabapentin  300 mg Oral QHS   insulin aspart  0-15 Units Subcutaneous TID WC   insulin aspart  4 Units Subcutaneous  TID WC   mouth rinse  15 mL Mouth Rinse BID   multivitamin  1 tablet Oral QHS   pantoprazole  40 mg Oral Q0600   polyethylene glycol  17 g Oral BID    have reviewed scheduled and prn medications.  Physical Exam: General: Lying on bed comfortable, not in distress Heart:RRR, s1s2 nl Lungs: Clear b/l, no crackle Abdomen:soft, Non-tender, non-distended Extremities: Bilateral below-knee amputation Dialysis Access: Right IJ TDC, left upper extremity with AVG, the edema is gradually improving.  I can hear bruit and thrill but he still not ready to be cannulated.  Roni Scow 02/11/2021,8:34 AM  LOS: 17 days

## 2021-02-11 NOTE — Progress Notes (Signed)
PROGRESS NOTE   Subjective/Complaints:  NO issues - ready for d/c today.   ROS:   Pt denies SOB, abd pain, CP, N/V/C/D, and vision changes    Objective:   No results found. Recent Labs    02/10/21 1431  WBC 6.5  HGB 8.4*  HCT 25.6*  PLT 198    Recent Labs    02/10/21 1431  NA 123*  K 4.6  CL 88*  CO2 22  GLUCOSE 107*  BUN 50*  CREATININE 6.12*  CALCIUM 8.0*     Intake/Output Summary (Last 24 hours) at 02/11/2021 0739 Last data filed at 02/10/2021 1829 Gross per 24 hour  Intake 220 ml  Output 2500 ml  Net -2280 ml        Physical Exam: Vital Signs Blood pressure (!) 123/97, pulse 77, temperature 98 F (36.7 C), resp. rate 18, height 5\' 8"  (1.727 m), weight 100.4 kg, SpO2 97 %.    General: awake, alert, irritable; NAD HENT: conjugate gaze; oropharynx moist CV: regular rate; no JVD Pulmonary: CTA B/L; no W/R/R- good air movement GI: soft, NT, ND, (+)BS Psychiatric: appropriate; irritable- doesn't want to go over d/c plans Neuro: Alert Dysarthria, stable Motor: Grossly 4/5, left weaker than right, unchanged  Assessment/Plan: 1. Functional deficits which require 3+ hours per day of interdisciplinary therapy in a comprehensive inpatient rehab setting. Physiatrist is providing close team supervision and 24 hour management of active medical problems listed below. Physiatrist and rehab team continue to assess barriers to discharge/monitor patient progress toward functional and medical goals  Care Tool:  Bathing    Body parts bathed by patient: Chest, Abdomen, Face, Right arm, Left arm, Left upper leg, Right upper leg   Body parts bathed by helper: Buttocks Body parts n/a: Right lower leg, Left lower leg   Bathing assist Assist Level: Moderate Assistance - Patient 50 - 74%     Upper Body Dressing/Undressing Upper body dressing   What is the patient wearing?: Pull over shirt    Upper body  assist Assist Level: Minimal Assistance - Patient > 75%    Lower Body Dressing/Undressing Lower body dressing    Lower body dressing activity did not occur: Refused What is the patient wearing?: Pants     Lower body assist Assist for lower body dressing: Moderate Assistance - Patient 50 - 74%     Toileting Toileting Toileting Activity did not occur Landscape architect and hygiene only): N/A (no void or bm)  Toileting assist       Transfers Chair/bed transfer  Transfers assist     Chair/bed transfer assist level: Minimal Assistance - Patient > 75%     Locomotion Ambulation   Ambulation assist   Ambulation activity did not occur: Safety/medical concerns  Assist level: Minimal Assistance - Patient > 75% Assistive device: Walker-rolling Max distance: 200 ft   Walk 10 feet activity   Assist  Walk 10 feet activity did not occur: Safety/medical concerns  Assist level: Minimal Assistance - Patient > 75% Assistive device: Walker-rolling   Walk 50 feet activity   Assist Walk 50 feet with 2 turns activity did not occur: Safety/medical concerns  Assist level: Minimal Assistance -  Patient > 75% Assistive device: Walker-rolling    Walk 150 feet activity   Assist Walk 150 feet activity did not occur: Safety/medical concerns  Assist level: Minimal Assistance - Patient > 75% Assistive device: Walker-rolling    Walk 10 feet on uneven surface  activity   Assist Walk 10 feet on uneven surfaces activity did not occur: Refused         Wheelchair     Assist Is the patient using a wheelchair?: Yes Type of Wheelchair: Manual    Wheelchair assist level: Supervision/Verbal cueing Max wheelchair distance: 50    Wheelchair 50 feet with 2 turns activity    Assist        Assist Level: Supervision/Verbal cueing   Wheelchair 150 feet activity     Assist      Assist Level: Maximal Assistance - Patient 25 - 49%   Blood pressure (!) 123/97,  pulse 77, temperature 98 F (36.7 C), resp. rate 18, height 5\' 8"  (1.727 m), weight 100.4 kg, SpO2 97 %.  Medical Problem List and Plan: 1.  Deficits with mobility, self-care, transfers, endurance secondary to L pontine stroke   Continue CIR- PT, OT and SLP  D/c 12/7- team conference today to complete d/c plans.   12/7- d/c today 2.  Antithrombotics: -DVT/anticoagulation:  Mechanical: Sequential compression devices, below knee Bilateral lower extremities  12/2- refuses SQ Heparin.              -antiplatelet therapy: DAPT X 3, changed to Plavix alone on 11/22 3. Pain Management: Tylenol prn.  4. Mood: LCSW to follow for evaluation and support.              -antipsychotic agents: N/A 5. Neuropsych: This patient is ?fully capable of making decisions on his own behalf. 6. Skin/Wound Care: Routine pressure relief measures.  7. Fluids/Electrolytes/Nutrition: Strict I/O. 1200 cc FR. Renal diet' 8. 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 Demand ischemia/NSVT: On amiodarone daily 10. Thrombocytopenia: Resolved 11. Anemia of chronic disease: ON Aranesp weekly for supplement.              Hemoglobin 8.3 on 12/1 12. T2DM with hyperglycemia: Hgb  A1C-6.8.  Monitor BS ac/hs.  --Continue SSI as well as 4 units novolog TID with meals 12/6- CBG's doing better- 86-146- con't regimen Monitor with increased mobility 13. Small acute left pontine infarct             See #2 14. S/p b/l BKA- chronic             Continue to monitor- pt states he was amb with Bilateral prosthetics  15. Impaired cognition- mild to moderate with impulsiveness and poor safety awareness- will con't SLP and therapies, however this is pt's biggest limitation.  16. Constipation  Sorbitol being ordered periodically  MiraLAX ordered on 12/3  12/6- LBM yesterday-c on't regimen 17/  Sleepiness/sedation Improving 18. Fever- low grade <100 for last 48h - CXR c/w atelectasis, order IS 12/6- Last temp was 99.2- no Sx's of illness- con't IS.  12/7- no Sx's-  17. Hyponatremia 12/7- Na 123- which is unusual for him- will d/w renal- See if need to hold d/c?  LOS: 17 days A FACE TO FACE EVALUATION WAS PERFORMED  Jenesis Martin 02/11/2021, 7:39  AM

## 2021-02-11 NOTE — Discharge Summary (Signed)
Physician Discharge Summary  Patient ID: [name] MRN: [id number] DOB/AGE: [birth year] 70 y.o.  Admit date: 01/25/2021 Discharge date: 02/12/2021  Discharge Diagnoses:  Principal Problem:   Left pontine stroke [facility]) Active Problems:   S/P bilateral BKA (below knee amputation) ([facility])   Controlled type 2 diabetes mellitus with hyperglycemia (H[facility])   Debility   Slow transit constipation   ESRD (end stage renal disease) on dialysis ([place])   Anemia of chronic disease   Hyponatremia   Discharged Condition: stable  Significant Diagnostic Studies:  DG CHEST PORT 1 VIEW  Result Date: 02/06/2021 CLINICAL DATA:  Fever, on dialysis EXAM: PORTABLE CHEST 1 VIEW COMPARISON:  01/22/2021 FINDINGS: Right dialysis catheter is unchanged. Persistent linear atelectasis/scarring mid right lung. Probable minimal atelectasis right lung base. No pleural effusion. Stable cardiomegaly. IMPRESSION: No acute process in the chest. Electronically Signed   By: Macy Mis M.D.   On: 02/06/2021 12:45   a Labs:  Basic Metabolic Panel: Recent Labs  Lab 02/05/21 1211 02/07/21 1348 02/10/21 1431  NA 129* 128* 123*  K 4.2 3.9 4.6  CL 90* 92* 88*  CO2 27 26 22   GLUCOSE 157* 131* 107*  BUN 36* 30* 50*  CREATININE 5.74* 4.78* 6.12*  CALCIUM 7.6* 7.8* 8.0*  PHOS 4.3 3.9 5.0*    CBC: Recent Labs  Lab 02/05/21 1211 02/07/21 1348 02/10/21 1431  WBC 7.0 5.9 6.5  HGB 8.3* 7.8* 8.4*  HCT 25.9* 24.3* 25.6*  MCV 92.2 92.4 89.8  PLT 207 195 198    CBG: Recent Labs  Lab 02/10/21 0506 02/10/21 1142 02/10/21 1858 02/10/21 2123 02/11/21 0617  GLUCAP 146* 159* 73* 125* 136*    Brief HPI:   [name] is a 70 y.o. male with history of HTN, CAD s/p CABG, CVA with Deficits, T2DM, B-BKAs, CKD 4 who had refused hemodialysis in the past.  He was admitted 01/12/2019 with nausea vomiting and bilateral lower extremity edema due to acute on chronic CHF.  He was evaluated by Dr. Terrence Dupont and echocardiogram  showed EF of 30 to 35% with global hypokinesis.  He was started on IV Lasix for diuresis and elevated cardiac enzymes felt to be due to demand ischemia.  Patient had cardiorenal syndrome with symptoms of uremia and hemodialysis was initiated.  AV graft placed by Dr. Doren Custard on 11/14 and postop noted to have LUE edema.  Hospital course was significant for episode of unresponsiveness on 11/17 with incontinent of bowel and bladder.  Dr. Cheral Marker evaluated patient and felt that exam likely due to asterixis due to metabolic arrangement and questioned seizure due to low calcium levels.  MRI brain done showing small acute left pontine infarct.  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  EEG was negative for seizures.  Stroke was felt to be due to small/large vessel disease but cardioembolic source was not ruled out.  Dr. Erlinda Hong recommended aspirin Plavix x3 weeks followed by Plavix alone as well as 30-day cardiac event monitor on outpatient basis to rule out A. fib.  Patient was noted to have impairments in mobility and ADLs and CIR was recommended due to functional decline.   Hospital Course: [name] was admitted to rehab 01/25/2021 for inpatient therapies to consist of PT, ST and OT at least three hours five days a week. Past admission physiatrist, therapy team and rehab RN have worked together to provide customized collaborative inpatient rehab. Hemodialysis was ongoing on TTS schedule and edema LUE was gradually improving  with hemodialysis as well as kinesiotaping.  Respiratory status is improved and he was weaned off oxygen.  Serial CBC done showed anemia of chronic disease which is being supplemented by ESA.  Blood pressures were monitored on 3 times daily basis and noted to be on the low side.  He was maintained on renal diet with nutritional supplements due to low albumin levels.  Hyponatremia was managed by dialysis as well as 1200 cc fluid restriction.  Bowel program has been  augmented to help manage constipation and he has been educated compliance with laxatives to avoid obstipation.  He has required oxycodone on rare occasions due to phantom pain in lower extremities during HD.  Blood sugars were monitored on ac/hs basis and were controlled with sliding scale insulin as well as 4 units NovoLog 3 times daily for meal coverage.  His p.o. intake has been good and blood sugars have been reasonably controlled.  He was transitioned back to home regiment of semaglutide at discharge.  He was noted to have low-grade fever however chest x-ray was negative for acute changes.  He was instructed on incentive spirometry to help with atelectasis.  He has made slow progress during his rehab stay due to  variable participation during his rehab stay and currently requires min assist at wheelchair level. He will continue to receive follow up HHPT, HHOT and HHSW by [facility] after discharge.   Rehab course: During patient's stay in rehab weekly team conferences were held to monitor patient's progress, set goals and discuss barriers to discharge. At admission, patient required max assist with mobility and mod assist with basic self-care tasks.  He exhibited mild cognitive impairment and was tolerating dysphagia 2 diet with thin liquids without difficulty.  He has had improvement in activity tolerance, balance, postural control as well as ability to compensate for deficits.  He requires min assist with verbal cues for ADL tasks.  He requires min assist for transfers and contact-guard assist to ambulate 200 feet with rolling walker.  He has refused education on safety as well as compensatory techniques and refused attempts at family education when caregiver was present.Marland Kitchen  He is to continue on dysphagia 2 diet and has been given handouts regarding appropriate foods.  He requires supervision with high-level cognitive tasks and ex-wife was educated on cognitive status.    Disposition: Home  Diet:  Dysphagia 2, Renal diet with 1200 cc fluid restriction per day.  Special Instructions: 1.  Outpatient HD TTS --be there at 11:10 for 11:30 chair.  Discharge Instructions     Ambulatory referral to Cardiology   Complete by: As directed    Needs hospital follow up--demand ischemia/NSVT   Ambulatory referral to Neurology   Complete by: As directed    An appointment is requested in approximately: 3-4 weeks. Has HD on TTS   Ambulatory referral to Physical Medicine Rehab   Complete by: As directed    Hospital follow up--has HD on TTS      Allergies as of 02/11/2021   No Known Allergies      Medication List     STOP taking these medications    aspirin 81 MG EC tablet   sodium bicarbonate 650 MG tablet       TAKE these medications    acetaminophen 325 MG tablet Commonly known as: TYLENOL Take 1-2 tablets (325-650 mg total) by mouth every 4 (four) hours as needed for mild pain.   amiodarone 200 MG tablet Commonly known as: PACERONE Take  1 tablet (200 mg total) by mouth daily. Hold if HR < 55   atorvastatin 80 MG tablet Commonly known as: LIPITOR Take 1 tablet (80 mg total) by mouth at bedtime.   clopidogrel 75 MG tablet Commonly known as: PLAVIX Take 1 tablet (75 mg total) by mouth daily.   gabapentin 300 MG capsule Commonly known as: NEURONTIN Take 1 capsule (300 mg total) by mouth at bedtime.   melatonin 5 MG Tabs Take 1 tablet (5 mg total) by mouth at bedtime as needed.   multivitamin Tabs tablet Take 1 tablet by mouth at bedtime.   nitroGLYCERIN 0.4 MG SL tablet Commonly known as: NITROSTAT Place 0.4 mg under the tongue every 5 (five) minutes as needed for chest pain.   oxyCODONE-acetaminophen 5-325 MG tablet--Rx# 5 pills Commonly known as: PERCOCET/ROXICET Take 1 tablet by mouth daily as needed for severe pain.   pantoprazole 40 MG tablet Commonly known as: PROTONIX Take 1 tablet (40 mg total) by mouth daily at 6 (six) AM.   polyethylene glycol 17  g packet Commonly known as: MIRALAX / GLYCOLAX Take 17 g by mouth 2 (two) times daily.   prochlorperazine 5 MG tablet Commonly known as: COMPAZINE Take 1-2 tablets (5-10 mg total) by mouth every 6 (six) hours as needed for nausea.   Rybelsus 3 MG Tabs Generic drug: Semaglutide Take 1 tablet (3 mg) by mouth daily.   VITAMIN B-12 PO Take 1 tablet by mouth daily.        [facility] Follow up.   Why: Schedule is Tuesday/Thursday/Saturday. First appointment, arrive at 10:30 to complete paperwork. After first appointment, arrive at 11:10 for 11:30 chair time. Contact information: [place] [address] [phone]         Charolette Forward, MD. Call today.   Specialty: Cardiology Why: for follow up appointment Contact information: [address] [place] [address] [phone]         Angelia Mould, MD Follow up.   Specialties: Vascular Surgery, Cardiology Contact information: [place] [address] [phone]         Courtney Heys, MD Follow up.   Specialty: Physical Medicine and Rehabilitation Why: office will call you with follow up appointment Contact information: [address] [place] [address] [phone]         [facility]. Call.   Specialty: Neurology Why: for post stroke follow up Contact information: [address] [place] [phone]                Signed: Bary Leriche 02/12/2021, 6:16 PM

## 2021-02-12 ENCOUNTER — Telehealth: Payer: Self-pay

## 2021-02-12 DIAGNOSIS — E871 Hypo-osmolality and hyponatremia: Secondary | ICD-10-CM

## 2021-02-12 DIAGNOSIS — D638 Anemia in other chronic diseases classified elsewhere: Secondary | ICD-10-CM

## 2021-02-12 LAB — CULTURE, BLOOD (ROUTINE X 2)
Culture: NO GROWTH
Culture: NO GROWTH
Special Requests: ADEQUATE
Special Requests: ADEQUATE

## 2021-02-12 NOTE — Telephone Encounter (Signed)
Left message on patient and Edna's cell to schedule follow up appointment with [facility].

## 2021-02-13 ENCOUNTER — Inpatient Hospital Stay (HOSPITAL_COMMUNITY): Payer: Medicare Other

## 2021-02-13 ENCOUNTER — Telehealth: Payer: Self-pay

## 2021-02-13 ENCOUNTER — Encounter (HOSPITAL_COMMUNITY): Admission: EM | Disposition: E | Payer: Self-pay | Source: Home / Self Care | Attending: Internal Medicine

## 2021-02-13 ENCOUNTER — Inpatient Hospital Stay (HOSPITAL_COMMUNITY)
Admission: EM | Admit: 2021-02-13 | Discharge: 2021-03-08 | DRG: 637 | Disposition: E | Payer: Medicare Other | Attending: Internal Medicine | Admitting: Internal Medicine

## 2021-02-13 ENCOUNTER — Emergency Department (HOSPITAL_COMMUNITY): Payer: Medicare Other

## 2021-02-13 ENCOUNTER — Encounter (HOSPITAL_COMMUNITY): Payer: Self-pay

## 2021-02-13 ENCOUNTER — Other Ambulatory Visit: Payer: Self-pay

## 2021-02-13 DIAGNOSIS — I69319 Unspecified symptoms and signs involving cognitive functions following cerebral infarction: Secondary | ICD-10-CM

## 2021-02-13 DIAGNOSIS — D631 Anemia in chronic kidney disease: Secondary | ICD-10-CM | POA: Diagnosis present

## 2021-02-13 DIAGNOSIS — E872 Acidosis, unspecified: Secondary | ICD-10-CM | POA: Diagnosis present

## 2021-02-13 DIAGNOSIS — E1122 Type 2 diabetes mellitus with diabetic chronic kidney disease: Secondary | ICD-10-CM | POA: Diagnosis present

## 2021-02-13 DIAGNOSIS — I469 Cardiac arrest, cause unspecified: Secondary | ICD-10-CM | POA: Diagnosis present

## 2021-02-13 DIAGNOSIS — J690 Pneumonitis due to inhalation of food and vomit: Secondary | ICD-10-CM | POA: Diagnosis present

## 2021-02-13 DIAGNOSIS — K219 Gastro-esophageal reflux disease without esophagitis: Secondary | ICD-10-CM | POA: Diagnosis present

## 2021-02-13 DIAGNOSIS — J302 Other seasonal allergic rhinitis: Secondary | ICD-10-CM | POA: Diagnosis present

## 2021-02-13 DIAGNOSIS — Z951 Presence of aortocoronary bypass graft: Secondary | ICD-10-CM

## 2021-02-13 DIAGNOSIS — Z992 Dependence on renal dialysis: Secondary | ICD-10-CM | POA: Diagnosis not present

## 2021-02-13 DIAGNOSIS — Z87891 Personal history of nicotine dependence: Secondary | ICD-10-CM

## 2021-02-13 DIAGNOSIS — I4510 Unspecified right bundle-branch block: Secondary | ICD-10-CM | POA: Diagnosis present

## 2021-02-13 DIAGNOSIS — R1310 Dysphagia, unspecified: Secondary | ICD-10-CM | POA: Diagnosis present

## 2021-02-13 DIAGNOSIS — Z89512 Acquired absence of left leg below knee: Secondary | ICD-10-CM

## 2021-02-13 DIAGNOSIS — I5022 Chronic systolic (congestive) heart failure: Secondary | ICD-10-CM | POA: Diagnosis present

## 2021-02-13 DIAGNOSIS — I69391 Dysphagia following cerebral infarction: Secondary | ICD-10-CM | POA: Diagnosis not present

## 2021-02-13 DIAGNOSIS — J9601 Acute respiratory failure with hypoxia: Secondary | ICD-10-CM | POA: Diagnosis present

## 2021-02-13 DIAGNOSIS — Z20822 Contact with and (suspected) exposure to covid-19: Secondary | ICD-10-CM | POA: Diagnosis present

## 2021-02-13 DIAGNOSIS — N189 Chronic kidney disease, unspecified: Secondary | ICD-10-CM

## 2021-02-13 DIAGNOSIS — Z7984 Long term (current) use of oral hypoglycemic drugs: Secondary | ICD-10-CM

## 2021-02-13 DIAGNOSIS — I132 Hypertensive heart and chronic kidney disease with heart failure and with stage 5 chronic kidney disease, or end stage renal disease: Secondary | ICD-10-CM | POA: Diagnosis present

## 2021-02-13 DIAGNOSIS — N186 End stage renal disease: Secondary | ICD-10-CM | POA: Diagnosis present

## 2021-02-13 DIAGNOSIS — R7401 Elevation of levels of liver transaminase levels: Secondary | ICD-10-CM | POA: Diagnosis present

## 2021-02-13 DIAGNOSIS — Z79899 Other long term (current) drug therapy: Secondary | ICD-10-CM

## 2021-02-13 DIAGNOSIS — Z961 Presence of intraocular lens: Secondary | ICD-10-CM | POA: Diagnosis present

## 2021-02-13 DIAGNOSIS — I249 Acute ischemic heart disease, unspecified: Secondary | ICD-10-CM

## 2021-02-13 DIAGNOSIS — E11641 Type 2 diabetes mellitus with hypoglycemia with coma: Secondary | ICD-10-CM | POA: Diagnosis present

## 2021-02-13 DIAGNOSIS — R001 Bradycardia, unspecified: Secondary | ICD-10-CM | POA: Diagnosis not present

## 2021-02-13 DIAGNOSIS — I252 Old myocardial infarction: Secondary | ICD-10-CM | POA: Diagnosis not present

## 2021-02-13 DIAGNOSIS — N400 Enlarged prostate without lower urinary tract symptoms: Secondary | ICD-10-CM | POA: Diagnosis present

## 2021-02-13 DIAGNOSIS — Z66 Do not resuscitate: Secondary | ICD-10-CM | POA: Diagnosis present

## 2021-02-13 DIAGNOSIS — Z7902 Long term (current) use of antithrombotics/antiplatelets: Secondary | ICD-10-CM

## 2021-02-13 DIAGNOSIS — I468 Cardiac arrest due to other underlying condition: Secondary | ICD-10-CM | POA: Diagnosis present

## 2021-02-13 DIAGNOSIS — N179 Acute kidney failure, unspecified: Secondary | ICD-10-CM | POA: Diagnosis present

## 2021-02-13 DIAGNOSIS — E162 Hypoglycemia, unspecified: Secondary | ICD-10-CM

## 2021-02-13 DIAGNOSIS — Z9289 Personal history of other medical treatment: Secondary | ICD-10-CM

## 2021-02-13 DIAGNOSIS — E7800 Pure hypercholesterolemia, unspecified: Secondary | ICD-10-CM | POA: Diagnosis present

## 2021-02-13 DIAGNOSIS — I2510 Atherosclerotic heart disease of native coronary artery without angina pectoris: Secondary | ICD-10-CM | POA: Diagnosis present

## 2021-02-13 DIAGNOSIS — Z4659 Encounter for fitting and adjustment of other gastrointestinal appliance and device: Secondary | ICD-10-CM

## 2021-02-13 DIAGNOSIS — Z89511 Acquired absence of right leg below knee: Secondary | ICD-10-CM

## 2021-02-13 LAB — POCT I-STAT 7, (LYTES, BLD GAS, ICA,H+H)
Acid-base deficit: 16 mmol/L — ABNORMAL HIGH (ref 0.0–2.0)
Acid-base deficit: 16 mmol/L — ABNORMAL HIGH (ref 0.0–2.0)
Acid-base deficit: 18 mmol/L — ABNORMAL HIGH (ref 0.0–2.0)
Bicarbonate: 10.5 mmol/L — ABNORMAL LOW (ref 20.0–28.0)
Bicarbonate: 10.6 mmol/L — ABNORMAL LOW (ref 20.0–28.0)
Bicarbonate: 9.4 mmol/L — ABNORMAL LOW (ref 20.0–28.0)
Calcium, Ion: 0.8 mmol/L — CL (ref 1.15–1.40)
Calcium, Ion: 0.81 mmol/L — CL (ref 1.15–1.40)
Calcium, Ion: 0.83 mmol/L — CL (ref 1.15–1.40)
HCT: 27 % — ABNORMAL LOW (ref 39.0–52.0)
HCT: 28 % — ABNORMAL LOW (ref 39.0–52.0)
HCT: 28 % — ABNORMAL LOW (ref 39.0–52.0)
Hemoglobin: 9.2 g/dL — ABNORMAL LOW (ref 13.0–17.0)
Hemoglobin: 9.5 g/dL — ABNORMAL LOW (ref 13.0–17.0)
Hemoglobin: 9.5 g/dL — ABNORMAL LOW (ref 13.0–17.0)
O2 Saturation: 100 %
O2 Saturation: 97 %
O2 Saturation: 98 %
Patient temperature: 97.5
Potassium: 3.9 mmol/L (ref 3.5–5.1)
Potassium: 4 mmol/L (ref 3.5–5.1)
Potassium: 4.2 mmol/L (ref 3.5–5.1)
Sodium: 132 mmol/L — ABNORMAL LOW (ref 135–145)
Sodium: 134 mmol/L — ABNORMAL LOW (ref 135–145)
Sodium: 136 mmol/L (ref 135–145)
TCO2: 10 mmol/L — ABNORMAL LOW (ref 22–32)
TCO2: 11 mmol/L — ABNORMAL LOW (ref 22–32)
TCO2: 11 mmol/L — ABNORMAL LOW (ref 22–32)
pCO2 arterial: 25.3 mmHg — ABNORMAL LOW (ref 32.0–48.0)
pCO2 arterial: 27 mmHg — ABNORMAL LOW (ref 32.0–48.0)
pCO2 arterial: 27.9 mmHg — ABNORMAL LOW (ref 32.0–48.0)
pH, Arterial: 7.138 — CL (ref 7.350–7.450)
pH, Arterial: 7.199 — CL (ref 7.350–7.450)
pH, Arterial: 7.226 — ABNORMAL LOW (ref 7.350–7.450)
pO2, Arterial: 113 mmHg — ABNORMAL HIGH (ref 83.0–108.0)
pO2, Arterial: 114 mmHg — ABNORMAL HIGH (ref 83.0–108.0)
pO2, Arterial: 277 mmHg — ABNORMAL HIGH (ref 83.0–108.0)

## 2021-02-13 LAB — CBC WITH DIFFERENTIAL/PLATELET
Abs Immature Granulocytes: 0.09 10*3/uL — ABNORMAL HIGH (ref 0.00–0.07)
Basophils Absolute: 0 10*3/uL (ref 0.0–0.1)
Basophils Relative: 0 %
Eosinophils Absolute: 0 10*3/uL (ref 0.0–0.5)
Eosinophils Relative: 0 %
HCT: 29.8 % — ABNORMAL LOW (ref 39.0–52.0)
Hemoglobin: 9.2 g/dL — ABNORMAL LOW (ref 13.0–17.0)
Immature Granulocytes: 1 %
Lymphocytes Relative: 6 %
Lymphs Abs: 0.7 10*3/uL (ref 0.7–4.0)
MCH: 29.8 pg (ref 26.0–34.0)
MCHC: 30.9 g/dL (ref 30.0–36.0)
MCV: 96.4 fL (ref 80.0–100.0)
Monocytes Absolute: 0.9 10*3/uL (ref 0.1–1.0)
Monocytes Relative: 8 %
Neutro Abs: 9 10*3/uL — ABNORMAL HIGH (ref 1.7–7.7)
Neutrophils Relative %: 85 %
Platelets: 220 10*3/uL (ref 150–400)
RBC: 3.09 MIL/uL — ABNORMAL LOW (ref 4.22–5.81)
RDW: 18.4 % — ABNORMAL HIGH (ref 11.5–15.5)
WBC: 10.7 10*3/uL — ABNORMAL HIGH (ref 4.0–10.5)
nRBC: 0.3 % — ABNORMAL HIGH (ref 0.0–0.2)

## 2021-02-13 LAB — ECHOCARDIOGRAM LIMITED
Calc EF: 12.3 %
Height: 67 in
Single Plane A2C EF: 16.3 %
Single Plane A4C EF: 10.9 %
Weight: 3200 [oz_av]

## 2021-02-13 LAB — I-STAT ARTERIAL BLOOD GAS, ED
Acid-base deficit: 13 mmol/L — ABNORMAL HIGH (ref 0.0–2.0)
Bicarbonate: 13.3 mmol/L — ABNORMAL LOW (ref 20.0–28.0)
Calcium, Ion: 0.8 mmol/L — CL (ref 1.15–1.40)
HCT: 28 % — ABNORMAL LOW (ref 39.0–52.0)
Hemoglobin: 9.5 g/dL — ABNORMAL LOW (ref 13.0–17.0)
O2 Saturation: 100 %
Potassium: 4.3 mmol/L (ref 3.5–5.1)
Sodium: 133 mmol/L — ABNORMAL LOW (ref 135–145)
TCO2: 14 mmol/L — ABNORMAL LOW (ref 22–32)
pCO2 arterial: 30.7 mmHg — ABNORMAL LOW (ref 32.0–48.0)
pH, Arterial: 7.245 — ABNORMAL LOW (ref 7.350–7.450)
pO2, Arterial: 308 mmHg — ABNORMAL HIGH (ref 83.0–108.0)

## 2021-02-13 LAB — COMPREHENSIVE METABOLIC PANEL
ALT: 289 U/L — ABNORMAL HIGH (ref 0–44)
AST: 1061 U/L — ABNORMAL HIGH (ref 15–41)
Albumin: 2.9 g/dL — ABNORMAL LOW (ref 3.5–5.0)
Alkaline Phosphatase: 149 U/L — ABNORMAL HIGH (ref 38–126)
Anion gap: 30 — ABNORMAL HIGH (ref 5–15)
BUN: 22 mg/dL (ref 8–23)
CO2: 12 mmol/L — ABNORMAL LOW (ref 22–32)
Calcium: 8.2 mg/dL — ABNORMAL LOW (ref 8.9–10.3)
Chloride: 93 mmol/L — ABNORMAL LOW (ref 98–111)
Creatinine, Ser: 4.04 mg/dL — ABNORMAL HIGH (ref 0.61–1.24)
GFR, Estimated: 15 mL/min — ABNORMAL LOW (ref >60)
Glucose, Bld: 70 mg/dL (ref 70–99)
Potassium: 4.6 mmol/L (ref 3.5–5.1)
Sodium: 135 mmol/L (ref 135–145)
Total Bilirubin: 2.5 mg/dL — ABNORMAL HIGH (ref 0.3–1.2)
Total Protein: 6.7 g/dL (ref 6.5–8.1)

## 2021-02-13 LAB — TROPONIN I (HIGH SENSITIVITY)
Troponin I (High Sensitivity): 571 ng/L (ref <18)
Troponin I (High Sensitivity): 782 ng/L (ref <18)

## 2021-02-13 LAB — BASIC METABOLIC PANEL
Anion gap: 36 — ABNORMAL HIGH (ref 5–15)
BUN: 22 mg/dL (ref 8–23)
CO2: 10 mmol/L — ABNORMAL LOW (ref 22–32)
Calcium: 7.2 mg/dL — ABNORMAL LOW (ref 8.9–10.3)
Chloride: 91 mmol/L — ABNORMAL LOW (ref 98–111)
Creatinine, Ser: 4.31 mg/dL — ABNORMAL HIGH (ref 0.61–1.24)
GFR, Estimated: 14 mL/min — ABNORMAL LOW (ref >60)
Glucose, Bld: 238 mg/dL — ABNORMAL HIGH (ref 70–99)
Potassium: 4.1 mmol/L (ref 3.5–5.1)
Sodium: 137 mmol/L (ref 135–145)

## 2021-02-13 LAB — GLUCOSE, CAPILLARY
Glucose-Capillary: 179 mg/dL — ABNORMAL HIGH (ref 70–99)
Glucose-Capillary: 199 mg/dL — ABNORMAL HIGH (ref 70–99)
Glucose-Capillary: 260 mg/dL — ABNORMAL HIGH (ref 70–99)
Glucose-Capillary: 275 mg/dL — ABNORMAL HIGH (ref 70–99)
Glucose-Capillary: 34 mg/dL — CL (ref 70–99)

## 2021-02-13 LAB — CBG MONITORING, ED
Glucose-Capillary: 19 mg/dL — CL (ref 70–99)
Glucose-Capillary: 34 mg/dL — CL (ref 70–99)
Glucose-Capillary: 85 mg/dL (ref 70–99)
Glucose-Capillary: 371 mg/dL — ABNORMAL HIGH (ref 70–99)
Glucose-Capillary: 53 mg/dL — ABNORMAL LOW (ref 70–99)
Glucose-Capillary: 322 mg/dL — ABNORMAL HIGH (ref 70–99)

## 2021-02-13 LAB — MAGNESIUM: Magnesium: 2.6 mg/dL — ABNORMAL HIGH (ref 1.7–2.4)

## 2021-02-13 LAB — RESP PANEL BY RT-PCR (FLU A&B, COVID) ARPGX2
Influenza A by PCR: NEGATIVE
Influenza B by PCR: NEGATIVE
SARS Coronavirus 2 by RT PCR: NEGATIVE

## 2021-02-13 LAB — HEPARIN LEVEL (UNFRACTIONATED): Heparin Unfractionated: 0.28 IU/mL — ABNORMAL LOW (ref 0.30–0.70)

## 2021-02-13 LAB — BRAIN NATRIURETIC PEPTIDE: B Natriuretic Peptide: >4500 pg/mL — ABNORMAL HIGH (ref 0.0–100.0)

## 2021-02-13 LAB — MRSA NEXT GEN BY PCR, NASAL: MRSA by PCR Next Gen: NOT DETECTED

## 2021-02-13 SURGERY — LEFT HEART CATH AND CORONARY ANGIOGRAPHY
Anesthesia: LOCAL

## 2021-02-13 MED ORDER — DEXTROSE 50 % IV SOLN
1.0000 | Freq: Once | INTRAVENOUS | Status: AC
  Administered 2021-02-13: 50 mL via INTRAVENOUS

## 2021-02-13 MED ORDER — ONDANSETRON HCL 4 MG/2ML IJ SOLN: 4.0000 mg | Freq: Four times a day (QID) | INTRAMUSCULAR | Status: DC | PRN

## 2021-02-13 MED ORDER — SODIUM BICARBONATE 8.4 % IV SOLN
INTRAVENOUS | Status: AC
  Administered 2021-02-13: 100 meq
  Filled 2021-02-13: qty 150

## 2021-02-13 MED ORDER — STERILE WATER FOR INJECTION IV SOLN: Freq: Once | INTRAVENOUS | Status: DC

## 2021-02-13 MED ORDER — VITAL 1.5 CAL PO LIQD
1000.0000 mL | ORAL | Status: DC
  Filled 2021-02-13 (×2): qty 1000

## 2021-02-13 MED ORDER — FENTANYL 2500MCG IN NS 250ML (10MCG/ML) PREMIX INFUSION
25.0000 ug/h | INTRAVENOUS | Status: DC
  Administered 2021-02-13: 75 ug/h via INTRAVENOUS
  Filled 2021-02-13 (×2): qty 250

## 2021-02-13 MED ORDER — IOHEXOL 350 MG/ML SOLN
80.0000 mL | Freq: Once | INTRAVENOUS | Status: AC | PRN
  Administered 2021-02-13: 80 mL via INTRAVENOUS

## 2021-02-13 MED ORDER — DEXTROSE 50 % IV SOLN
INTRAVENOUS | Status: AC
  Filled 2021-02-13: qty 50

## 2021-02-13 MED ORDER — SODIUM BICARBONATE 8.4 % IV SOLN
100.0000 meq | Freq: Once | INTRAVENOUS | Status: AC
Start: 2021-02-13 — End: 2021-02-13

## 2021-02-13 MED ORDER — POLYETHYLENE GLYCOL 3350 17 G PO PACK: 17.0000 g | PACK | Freq: Every day | ORAL | Status: DC

## 2021-02-13 MED ORDER — SODIUM CHLORIDE 0.9 % IV SOLN
3.0000 g | Freq: Three times a day (TID) | INTRAVENOUS | Status: DC
  Administered 2021-02-13: 3 g via INTRAVENOUS
  Filled 2021-02-13: qty 8

## 2021-02-13 MED ORDER — DEXTROSE 50 % IV SOLN
INTRAVENOUS | Status: AC
  Administered 2021-02-13: 50 mL
  Filled 2021-02-13: qty 50

## 2021-02-13 MED ORDER — SODIUM CHLORIDE 0.9 % IV SOLN: 250.0000 mL | INTRAVENOUS | Status: DC

## 2021-02-13 MED ORDER — ACETAMINOPHEN 325 MG PO TABS: 650.0000 mg | ORAL_TABLET | ORAL | Status: DC | PRN

## 2021-02-13 MED ORDER — ACETAMINOPHEN 650 MG RE SUPP: 650.0000 mg | RECTAL | Status: DC

## 2021-02-13 MED ORDER — ACETAMINOPHEN 325 MG PO TABS: 650.0000 mg | ORAL_TABLET | ORAL | Status: DC

## 2021-02-13 MED ORDER — EPINEPHRINE 1 MG/10ML IJ SOSY
PREFILLED_SYRINGE | INTRAMUSCULAR | Status: AC | PRN
  Administered 2021-02-13 (×4): 1 mg via INTRAVENOUS

## 2021-02-13 MED ORDER — SODIUM BICARBONATE 8.4 % IV SOLN
INTRAVENOUS | Status: DC
  Filled 2021-02-13 (×4): qty 1000

## 2021-02-13 MED ORDER — RENA-VITE PO TABS
1.0000 | ORAL_TABLET | Freq: Every day | ORAL | Status: DC
  Administered 2021-02-13: 1
  Filled 2021-02-13: qty 1

## 2021-02-13 MED ORDER — DEXTROSE 10 % IV SOLN: INTRAVENOUS | Status: DC

## 2021-02-13 MED ORDER — DOCUSATE SODIUM 50 MG/5ML PO LIQD: 100.0000 mg | Freq: Two times a day (BID) | ORAL | Status: DC | PRN

## 2021-02-13 MED ORDER — VASOPRESSIN 20 UNITS/100 ML INFUSION FOR SHOCK
0.0300 [IU]/min | INTRAVENOUS | Status: DC
  Administered 2021-02-13 – 2021-02-14 (×2): 0.03 [IU]/min via INTRAVENOUS
  Filled 2021-02-13 (×2): qty 100

## 2021-02-13 MED ORDER — ACETAMINOPHEN 160 MG/5ML PO SOLN
650.0000 mg | ORAL | Status: DC
  Administered 2021-02-13 (×3): 650 mg
  Filled 2021-02-13: qty 20.3

## 2021-02-13 MED ORDER — DEXTROSE 50 % IV SOLN
INTRAVENOUS | Status: AC | PRN
  Administered 2021-02-13: 1 via INTRAVENOUS
  Administered 2021-02-13: 50 mL via INTRAVENOUS

## 2021-02-13 MED ORDER — DOCUSATE SODIUM 50 MG/5ML PO LIQD
100.0000 mg | Freq: Two times a day (BID) | ORAL | Status: DC
  Filled 2021-02-13: qty 10

## 2021-02-13 MED ORDER — FENTANYL CITRATE (PF) 100 MCG/2ML IJ SOLN: 25.0000 ug | Freq: Once | INTRAMUSCULAR | Status: DC

## 2021-02-13 MED ORDER — SUCCINYLCHOLINE CHLORIDE 200 MG/10ML IV SOSY
PREFILLED_SYRINGE | INTRAVENOUS | Status: AC
  Filled 2021-02-13: qty 10

## 2021-02-13 MED ORDER — EPINEPHRINE 1 MG/10ML IJ SOSY
PREFILLED_SYRINGE | INTRAMUSCULAR | Status: AC
  Filled 2021-02-13: qty 10

## 2021-02-13 MED ORDER — FENTANYL CITRATE PF 50 MCG/ML IJ SOSY: 25.0000 ug | PREFILLED_SYRINGE | Freq: Once | INTRAMUSCULAR | Status: DC

## 2021-02-13 MED ORDER — HEPARIN SODIUM (PORCINE) 1000 UNIT/ML DIALYSIS
1000.0000 [IU] | INTRAMUSCULAR | Status: DC | PRN
  Filled 2021-02-13: qty 6

## 2021-02-13 MED ORDER — SODIUM CHLORIDE 0.9 % IV SOLN
0.5000 ug/min | INTRAVENOUS | Status: DC
  Administered 2021-02-13: 0.5 ug/min via INTRAVENOUS
  Filled 2021-02-13 (×2): qty 10

## 2021-02-13 MED ORDER — NOREPINEPHRINE 16 MG/250ML-% IV SOLN
5.0000 ug/min | INTRAVENOUS | Status: DC
  Administered 2021-02-13: 50 ug/min via INTRAVENOUS
  Administered 2021-02-13: 30 ug/min via INTRAVENOUS
  Filled 2021-02-13 (×3): qty 250

## 2021-02-13 MED ORDER — FENTANYL 2500MCG IN NS 250ML (10MCG/ML) PREMIX INFUSION: 25.0000 ug/h | INTRAVENOUS | Status: DC

## 2021-02-13 MED ORDER — ORAL CARE MOUTH RINSE
15.0000 mL | OROMUCOSAL | Status: DC
  Administered 2021-02-13 – 2021-02-14 (×3): 15 mL via OROMUCOSAL

## 2021-02-13 MED ORDER — HYDROCORTISONE SOD SUC (PF) 100 MG IJ SOLR
100.0000 mg | Freq: Two times a day (BID) | INTRAMUSCULAR | Status: DC
  Administered 2021-02-13: 100 mg via INTRAVENOUS
  Filled 2021-02-13 (×2): qty 2

## 2021-02-13 MED ORDER — NOREPINEPHRINE 4 MG/250ML-% IV SOLN
5.0000 ug/min | INTRAVENOUS | Status: DC
  Administered 2021-02-13: 10 ug/min via INTRAVENOUS
  Administered 2021-02-13: 30 ug/min via INTRAVENOUS
  Filled 2021-02-13 (×2): qty 250

## 2021-02-13 MED ORDER — KETAMINE HCL 50 MG/5ML IJ SOSY
PREFILLED_SYRINGE | INTRAMUSCULAR | Status: AC
  Filled 2021-02-13: qty 5

## 2021-02-13 MED ORDER — FENTANYL BOLUS VIA INFUSION
25.0000 ug | INTRAVENOUS | Status: DC | PRN
  Administered 2021-02-13 (×3): 75 ug via INTRAVENOUS
  Filled 2021-02-13: qty 100

## 2021-02-13 MED ORDER — PRISMASOL BGK 4/2.5 32-4-2.5 MEQ/L REPLACEMENT SOLN
Status: DC
  Filled 2021-02-13 (×2): qty 5000

## 2021-02-13 MED ORDER — NOREPINEPHRINE 4 MG/250ML-% IV SOLN: 2.0000 ug/min | INTRAVENOUS | Status: DC

## 2021-02-13 MED ORDER — CHLORHEXIDINE GLUCONATE 0.12% ORAL RINSE (MEDLINE KIT)
15.0000 mL | Freq: Two times a day (BID) | OROMUCOSAL | Status: DC
  Administered 2021-02-13: 15 mL via OROMUCOSAL

## 2021-02-13 MED ORDER — PROPOFOL 1000 MG/100ML IV EMUL
0.0000 ug/kg/min | INTRAVENOUS | Status: DC
  Administered 2021-02-13: 10 ug/kg/min via INTRAVENOUS
  Filled 2021-02-13: qty 100

## 2021-02-13 MED ORDER — PANTOPRAZOLE SODIUM 40 MG IV SOLR
40.0000 mg | Freq: Every day | INTRAVENOUS | Status: DC
  Administered 2021-02-13: 40 mg via INTRAVENOUS
  Filled 2021-02-13: qty 40

## 2021-02-13 MED ORDER — SODIUM BICARBONATE 8.4 % IV SOLN
INTRAVENOUS | Status: AC
  Administered 2021-02-13: 100 meq via INTRAVENOUS
  Filled 2021-02-13: qty 100

## 2021-02-13 MED ORDER — ASPIRIN 300 MG RE SUPP
300.0000 mg | Freq: Once | RECTAL | Status: AC
  Administered 2021-02-13: 300 mg via RECTAL

## 2021-02-13 MED ORDER — SODIUM BICARBONATE 8.4 % IV SOLN
INTRAVENOUS | Status: AC
  Administered 2021-02-13: 100 mL
  Filled 2021-02-13: qty 100

## 2021-02-13 MED ORDER — VITAL HIGH PROTEIN PO LIQD: 1000.0000 mL | ORAL | Status: DC

## 2021-02-13 MED ORDER — HEPARIN BOLUS VIA INFUSION
3000.0000 [IU] | Freq: Once | INTRAVENOUS | Status: AC
  Administered 2021-02-13: 3000 [IU] via INTRAVENOUS
  Filled 2021-02-13: qty 3000

## 2021-02-13 MED ORDER — PRISMASOL BGK 4/2.5 32-4-2.5 MEQ/L EC SOLN
Status: DC
  Filled 2021-02-13 (×9): qty 5000

## 2021-02-13 MED ORDER — SODIUM BICARBONATE 8.4 % IV SOLN
INTRAVENOUS | Status: AC | PRN
  Administered 2021-02-13 (×2): 50 meq via INTRAVENOUS

## 2021-02-13 MED ORDER — CHLORHEXIDINE GLUCONATE CLOTH 2 % EX PADS
6.0000 | MEDICATED_PAD | Freq: Every day | CUTANEOUS | Status: DC
  Administered 2021-02-13: 6 via TOPICAL

## 2021-02-13 MED ORDER — ROCURONIUM BROMIDE 10 MG/ML (PF) SYRINGE
PREFILLED_SYRINGE | INTRAVENOUS | Status: AC
  Filled 2021-02-13: qty 10

## 2021-02-13 MED ORDER — EPINEPHRINE HCL 5 MG/250ML IV SOLN IN NS
0.5000 ug/min | INTRAVENOUS | Status: DC
Start: 2021-02-13 — End: 2021-02-13

## 2021-02-13 MED ORDER — SODIUM BICARBONATE 8.4 % IV SOLN
INTRAVENOUS | Status: AC
  Administered 2021-02-13: 50 meq
  Filled 2021-02-13: qty 100

## 2021-02-13 MED ORDER — LORAZEPAM 2 MG/ML IJ SOLN
INTRAMUSCULAR | Status: AC
  Administered 2021-02-13: 1 mg
  Filled 2021-02-13: qty 1

## 2021-02-13 MED ORDER — HEPARIN (PORCINE) 25000 UT/250ML-% IV SOLN
1400.0000 [IU]/h | INTRAVENOUS | Status: DC
  Administered 2021-02-13: 1300 [IU]/h via INTRAVENOUS
  Administered 2021-02-13: 1400 [IU]/h via INTRAVENOUS
  Filled 2021-02-13 (×2): qty 250

## 2021-02-13 MED ORDER — LORAZEPAM 2 MG/ML IJ SOLN
1.0000 mg | Freq: Once | INTRAMUSCULAR | Status: AC
  Administered 2021-02-13: 1 mg via INTRAVENOUS

## 2021-02-13 MED ORDER — ETOMIDATE 2 MG/ML IV SOLN
INTRAVENOUS | Status: AC
  Filled 2021-02-13: qty 20

## 2021-02-13 MED ORDER — ATROPINE SULFATE 1 MG/10ML IJ SOSY
PREFILLED_SYRINGE | INTRAMUSCULAR | Status: AC
  Filled 2021-02-13: qty 10

## 2021-02-13 MED ORDER — POLYETHYLENE GLYCOL 3350 17 G PO PACK: 17.0000 g | PACK | Freq: Every day | ORAL | Status: DC | PRN

## 2021-02-13 MED ORDER — AMPICILLIN-SULBACTAM SODIUM 3 (2-1) G IJ SOLR
3.0000 g | INTRAMUSCULAR | Status: DC
  Administered 2021-02-13: 3 g via INTRAVENOUS
  Filled 2021-02-13: qty 8

## 2021-02-13 MED ORDER — NOREPINEPHRINE 4 MG/250ML-% IV SOLN
INTRAVENOUS | Status: AC
  Filled 2021-02-13: qty 250

## 2021-02-13 MED ORDER — CALCIUM GLUCONATE-NACL 2-0.675 GM/100ML-% IV SOLN
2.0000 g | Freq: Once | INTRAVENOUS | Status: AC
  Administered 2021-02-13: 2000 mg via INTRAVENOUS
  Filled 2021-02-13 (×2): qty 100

## 2021-02-13 MED ORDER — PRISMASOL BGK 4/2.5 32-4-2.5 MEQ/L REPLACEMENT SOLN
Status: DC
  Filled 2021-02-13 (×3): qty 5000

## 2021-02-13 MED ORDER — SODIUM BICARBONATE 8.4 % IV SOLN
INTRAVENOUS | Status: AC
  Filled 2021-02-13: qty 50

## 2021-02-13 MED ORDER — FENTANYL BOLUS VIA INFUSION: 25.0000 ug | INTRAVENOUS | Status: DC | PRN

## 2021-02-13 MED ORDER — SODIUM CHLORIDE 0.9 % FOR CRRT: INTRAVENOUS_CENTRAL | Status: DC | PRN

## 2021-02-13 MED ORDER — SODIUM BICARBONATE 8.4 % IV SOLN
100.0000 meq | Freq: Once | INTRAVENOUS | Status: AC
Start: 2021-02-13 — End: 2021-02-13
  Administered 2021-02-13: 100 meq via INTRAVENOUS
  Filled 2021-02-13: qty 50

## 2021-02-13 MED ORDER — ASPIRIN 81 MG PO CHEW
324.0000 mg | CHEWABLE_TABLET | Freq: Once | ORAL | Status: DC
  Filled 2021-02-13: qty 4

## 2021-02-13 NOTE — ED Notes (Signed)
RN walked in to administer meds to pt. Pt noted to be lethargic, sweaty, and unresponsive to questions. RN checked blood sugar, CBG 19. RN gave pt orange juice and received a verbal order for 1 amp of D50. MD at bedside

## 2021-02-13 NOTE — ED Notes (Signed)
Levo drip switched to 20 right forearm IV team started

## 2021-02-13 NOTE — Procedures (Signed)
Patient Name: [name]  MRN: [id number]  Epilepsy Attending: Lora Havens  Referring Physician/Provider: Dr Ina Homes Date: 02/21/2021  Duration: 22.45 mins  Patient history: 70yo M s/p cardiac arrest. EEG to evaluate for seizure  Level of alertness: comatose  AEDs during EEG study: None  Technical aspects: This EEG study was done with scalp electrodes positioned according to the 10-20 International system of electrode placement. Electrical activity was acquired at a sampling rate of 500Hz  and reviewed with a high frequency filter of 70Hz  and a low frequency filter of 1Hz . EEG data were recorded continuously and digitally stored.   Description: EEG showed continuous generalized low amplitude 3 to 6 Hz theta-delta slowing. Hyperventilation and photic stimulation were not performed.     ABNORMALITY - Continuous slow, generalized  IMPRESSION: This study is suggestive of severe diffuse encephalopathy, nonspecific etiology. No seizures or epileptiform discharges were seen throughout the recording.  [name]

## 2021-02-13 NOTE — ED Notes (Signed)
RT at bedside saturations dropped in 80's O2 probe changed. MD aware of status and aware pt attempting to breath over vent

## 2021-02-13 NOTE — Consult Note (Signed)
Cardiology Consultation:   Patient ID: [name] MRN: [id number]; DOB: [birth year]  Admit date: 02/17/2021 Date of Consult: 02/27/2021  PCP:  [name], MD   [facility] Providers Cardiologist:  None        Patient Profile:   [name] is a 70 y.o. male with a hx of ESRD, recent stroke, who is being seen 02/19/2021 for the evaluation of STEMI at the request of Dr Roxanne Mins.  History of Present Illness:   [name] was recently hospitalized with a stroke and prolonged stay at inpatient rehab.  He was discharged from inpatient rehab 2 days ago.  He has a complex history with multiple severe medical problems.  He has had multivessel CABG, advanced stage kidney disease now on hemodialysis, and bilateral BKA's.  He presented overnight with weakness and feeling terrible.  He became severely hypoglycemic and then had a respiratory arrest this morning.  He required multiple rounds of epinephrine, mechanical ventilation, and CPR.  His post resuscitation EKG raise concern of inferior STEMI and a code STEMI is paged out.  I am evaluating the patient in the emergency department.  He is now intubated and sedated.  No other history is obtainable at this time.  Pertinent labs include an AST of 1061, ALT of 289, potassium 4.6, troponin 782, BNP greater than 4500, hemoglobin 9.2, platelet count 222,000, white blood cell count 10.7.   Past Medical History:  Diagnosis Date   CHF (congestive heart failure) (HCC)    Chronic Kidney Disease IV    Exertional shortness of breath    "before my last OR; I'm fine now" (09/26/2012)   GERD (gastroesophageal reflux disease)    High cholesterol    Hx of seasonal allergies    Hypertension    Myocardial infarction [facility])    Scrotal edema 01/30/2019   Stroke ([place]) 2009   memory loss   Type II diabetes mellitus ([place])     Past Surgical History:  Procedure Laterality Date   AMPUTATION Right 07/29/2012   Procedure: AMPUTATION 3RD TOE;  Surgeon: Kerin Salen, MD;  Location: [place];  Service: Orthopedics;  Laterality: Right;  right third toe amputation   AMPUTATION Right 08/02/2012   Procedure: right transmetatarsal amputation;  Surgeon: Kerin Salen, MD;  Location: [place];  Service: Orthopedics;  Laterality: Right;   AMPUTATION Right 09/06/2012   Procedure: AMPUTATION BELOW KNEE;  Surgeon: Kerin Salen, MD;  Location: [place];  Service: Orthopedics;  Laterality: Right;   AMPUTATION Left 09/27/2012   Procedure: AMPUTATION BELOW KNEE;  Surgeon: Kerin Salen, MD;  Location: [place];  Service: Orthopedics;  Laterality: Left;   AV FISTULA PLACEMENT Left 01/19/2021   Procedure: ARTERIOVENOUS (AV) FISTULA OF LEFT ARM  USING GRAFT;  Surgeon: Angelia Mould, MD;  Location: [place];  Service: Vascular;  Laterality: Left;   CATARACT EXTRACTION W/ INTRAOCULAR LENS  IMPLANT, BILATERAL  ?2010   CORONARY ARTERY BYPASS GRAFT  2008   CABG X4   ESOPHAGOGASTRODUODENOSCOPY N/A 10/08/2012   Procedure: ESOPHAGOGASTRODUODENOSCOPY (EGD);  Surgeon: Winfield Cunas., MD;  [facility]: [facility] ENDOSCOPY;  Service: Endoscopy;  Laterality: N/A;   IR FLUORO GUIDE CV LINE RIGHT  01/14/2021   IR US GUIDE VASC ACCESS RIGHT  01/14/2021   PERIPHERALLY INSERTED CENTRAL CATHETER INSERTION Right 09/2012   upper arm     Home Medications:  Prior to Admission medications   Medication Sig Start Date End Date Taking? Authorizing Provider  acetaminophen (TYLENOL)  325 MG tablet Take 1-2 tablets (325-650 mg total) by mouth every 4 (four) hours as needed for mild pain. 02/11/21  Yes Love, Ivan Anchors, PA-C  amiodarone (PACERONE) 200 MG tablet Take 1 tablet (200 mg total) by mouth daily. Hold if HR < 55 02/11/21  Yes Love, Ivan Anchors, PA-C  atorvastatin (LIPITOR) 80 MG tablet Take 1 tablet (80 mg total) by mouth at bedtime. 02/11/21  Yes Love, Ivan Anchors, PA-C  clopidogrel (PLAVIX) 75 MG tablet Take 1 tablet (75 mg total) by mouth daily. 02/11/21  Yes Love, Ivan Anchors, PA-C  furosemide (LASIX) 80 MG tablet  Take 80 mg by mouth 2 (two) times daily. 01/30/21  Yes [provider]  gabapentin (NEURONTIN) 300 MG capsule Take 1 capsule (300 mg total) by mouth at bedtime. 01/08/21  Yes Katsadouros, Vasilios, MD  melatonin 5 MG TABS Take 1 tablet (5 mg total) by mouth at bedtime as needed. Patient taking differently: Take 5 mg by mouth at bedtime as needed (sleep). 02/11/21  Yes Love, Ivan Anchors, PA-C  multivitamin (RENA-VIT) TABS tablet Take 1 tablet by mouth at bedtime. 02/11/21  Yes Love, Ivan Anchors, PA-C  nitroGLYCERIN (NITROSTAT) 0.4 MG SL tablet Place 0.4 mg under the tongue every 5 (five) minutes as needed for chest pain.   Yes [provider]  oxyCODONE-acetaminophen (PERCOCET/ROXICET) 5-325 MG tablet Take 1 tablet by mouth daily as needed for severe pain. 02/11/21  Yes Love, Ivan Anchors, PA-C  pantoprazole (PROTONIX) 40 MG tablet Take 1 tablet (40 mg total) by mouth daily at 6 (six) AM. 12/21/18  Yes Charolette Forward, MD  polyethylene glycol (MIRALAX / GLYCOLAX) 17 g packet Take 17 g by mouth 2 (two) times daily. Patient taking differently: Take 17 g by mouth daily as needed for mild constipation. 02/11/21  Yes Love, Ivan Anchors, PA-C  prochlorperazine (COMPAZINE) 5 MG tablet Take 1-2 tablets (5-10 mg total) by mouth every 6 (six) hours as needed for nausea. 02/11/21  Yes Love, Ivan Anchors, PA-C  Semaglutide (RYBELSUS) 3 MG TABS Take 1 tablet (3 mg) by mouth daily. 01/25/21  Yes Gaylan Gerold, DO    Inpatient Medications: Scheduled Meds:  etomidate       fentaNYL (SUBLIMAZE) injection  25 mcg Intravenous Once   Continuous Infusions:  dextrose 100 mL/hr at 03/05/2021 0655   fentaNYL infusion INTRAVENOUS 75 mcg/hr (02/26/2021 0735)   heparin 1,300 Units/hr (02/06/2021 0423)   PRN Meds: dextrose, EPINEPHrine, fentaNYL, sodium bicarbonate  Allergies:   No Known Allergies  Social History:   Social History   Socioeconomic History   Marital status: Legally Separated    Spouse name: Not on file    Number of children: Not on file   Years of education: Not on file   Highest education level: Not on file  Occupational History   Not on file  Tobacco Use   Smoking status: Former    Packs/day: 0.12    Years: 20.00    Pack years: 2.40    Types: Cigarettes   Smokeless tobacco: Never  Vaping Use   Vaping Use: Never used  Substance and Sexual Activity   Alcohol use: No    Comment: last drink 2010   Drug use: No   Sexual activity: Never  Other Topics Concern   Not on file  Social History Narrative   Not on file   Social Determinants of Health   Financial Resource Strain: Not on file  Food Insecurity: Not on file  Transportation Needs: Not  on file  Physical Activity: Not on file  Stress: Not on file  Social Connections: Not on file  Intimate Partner Violence: Not on file    Family History:   No family history on file.   ROS:  Please see the history of present illness.  Unable to obtain due to patient's critical condition  Physical Exam/Data:   Vitals:   03/06/2021 0445 02/16/2021 0515 02/28/2021 0545 02/24/2021 0644  BP: 90/78 113/70 117/74   Pulse:   98 95  Resp: 19 17 (!) 21 (!) 26  Temp:      SpO2: 100% 95% 100%   Weight:      Height:       No intake or output data in the 24 hours ending 02/07/2021 0741 Last 3 Weights 02/22/2021 02/11/2021 02/10/2021  Weight (lbs) 200 lb 221 lb 5.5 oz 218 lb 11.1 oz  Weight (kg) 90.719 kg 100.4 kg 99.2 kg     Body mass index is 31.32 kg/m.  General: Chronically ill-appearing male, intubated HEENT: normal, ET tube in place Neck: no JVD Vascular: No carotid bruits Cardiac:  normal S1, S2; RRR; no murmur distant heart sounds Lungs: Coarse breath sounds bilaterally, no wheezing, rhonchi or rales  Abd: soft, no abdominal masses no hepatomegaly  Ext: Bilateral below-knee amputations Musculoskeletal: As above Skin: warm and dry  Neuro: Unable to assess Psych: Unable to assess  EKG:  The EKG was personally reviewed and demonstrates:  Normal sinus rhythm with occasional PVC, wide complex QRS and right bundle block pattern unchanged from prior tracings, possible acute inferior injury but difficult to determine in context of patient's wide right bundle branch block  Telemetry:  Telemetry was personally reviewed and demonstrates: Sinus rhythm  Relevant CV Studies: 2D echocardiogram: IMPRESSIONS     1. Left ventricular ejection fraction, by estimation, is 30 to 35%. The  left ventricle has moderate to severely decreased function. The left  ventricle demonstrates global hypokinesis. The left ventricular internal  cavity size was mildly dilated. Left  ventricular diastolic function could not be evaluated.   2. Right ventricular systolic function is mildly reduced. The right  ventricular size is normal. There is moderately elevated pulmonary artery  systolic pressure.   3. Left atrial size was mildly dilated.   4. The mitral valve is normal in structure. Moderate mitral valve  regurgitation.   5. Tricuspid valve regurgitation is mild to moderate.   6. The aortic valve is normal in structure. Aortic valve regurgitation is  not visualized. Mild aortic valve sclerosis is present, with no evidence  of aortic valve stenosis.   7. The inferior vena cava is dilated in size with <50% respiratory  variability, suggesting right atrial pressure of 15 mmHg.   Laboratory Data:  High Sensitivity Troponin:   Recent Labs  Lab 01/23/21 0054 01/23/21 0623 01/23/21 0755 03/02/2021 0148 02/22/2021 0359  TROPONINIHS 318* 340* 336* 571* 782*     Chemistry Recent Labs  Lab 02/07/21 1348 02/10/21 1431 02/25/2021 0148  NA 128* 123* 135  K 3.9 4.6 4.6  CL 92* 88* 93*  CO2 26 22 12*  GLUCOSE 131* 107* 70  BUN 30* 50* 22  CREATININE 4.78* 6.12* 4.04*  CALCIUM 7.8* 8.0* 8.2*  GFRNONAA 12* 9* 15*  ANIONGAP 10 13 30*    Recent Labs  Lab 02/07/21 1348 02/10/21 1431 02/06/2021 0148  PROT  --   --  6.7  ALBUMIN 2.4* 2.5* 2.9*  AST   --   --  1,061*  ALT  --   --  289*  ALKPHOS  --   --  149*  BILITOT  --   --  2.5*   Lipids No results for input(s): CHOL, TRIG, HDL, LABVLDL, LDLCALC, CHOLHDL in the last 168 hours.  Hematology Recent Labs  Lab 02/07/21 1348 02/10/21 1431 02/12/2021 0148  WBC 5.9 6.5 10.7*  RBC 2.63* 2.85* 3.09*  HGB 7.8* 8.4* 9.2*  HCT 24.3* 25.6* 29.8*  MCV 92.4 89.8 96.4  MCH 29.7 29.5 29.8  MCHC 32.1 32.8 30.9  RDW 18.2* 17.8* 18.4*  PLT 195 198 220   Thyroid No results for input(s): TSH, FREET4 in the last 168 hours.  BNP Recent Labs  Lab 02/26/2021 0148  BNP >4,500.0*    DDimer No results for input(s): DDIMER in the last 168 hours.   Radiology/Studies:  DG Chest Port 1 View  Result Date: 02/16/2021 CLINICAL DATA:  Shortness of breath. EXAM: PORTABLE CHEST 1 VIEW COMPARISON:  02/06/2021. FINDINGS: The heart is enlarged there is evidence of prior cardiothoracic surgery. Atherosclerotic calcification of the aorta is noted. The pulmonary vasculature is within normal limits. Lung volumes are low. Linear atelectasis or scarring is noted in the mid right lung and unchanged from the previous exam. No effusion or pneumothorax. A stable right internal jugular central venous catheter is noted. IMPRESSION: Cardiomegaly with no acute cardiopulmonary process. Electronically Signed   By: Brett Fairy M.D.   On: 02/27/2021 01:47     Assessment and Plan:   1.  Cardiopulmonary arrest: Extensive discussion with Dr. Roxanne Mins in the emergency department.  By history it sounds like this was initially a respiratory arrest then bradycardic arrest.  The patient has required intubation, multiple rounds of epinephrine, and CPR, now with return of spontaneous circulation. 2.  Question inferior STEMI: The patient's baseline EKG has significant abnormality with right bundle branch block and abnormal inferior ST segments at baseline.  This is more pronounced but I do not think that this represents an acute STEMI.  The  patient's clinical scenario is not typical of STEMI.  He has had no chest pain.  He has been in the emergency room for several hours with nonspecific symptoms.  He has had refractory hypoglycemia, markedly elevated transaminase, and unclear etiology of his cardiopulmonary arrest at this point.  He had a recent stroke, new end-stage renal disease now requiring dialysis, and is a bilateral amputee.  He appears to have very poor functional status and severe comorbid medical conditions now acutely ill.  He is not a candidate for emergency cardiac catheterization. 3.  Known CAD with history of CABG: Appropriate to place him on heparin, cycle enzymes, and follow his clinical course.  To date his troponin values are 571 increased to 782.  In this critically ill patient on dialysis these troponin values are not unexpected. 4.  Acute respiratory failure: Management per CCM team 5.  End-stage renal disease: Management per nephrology  Disposition: Patient is not appropriate to take him for an emergent cardiac procedure due to reasons outlined above.  I do not think his clinical presentation is consistent with an acute STEMI.  In reviewing his chart, he was recently cared for by Dr. Terrence Dupont in the hospital.  I contacted him and he is covered by Dr. Doylene Canard over the weekend.  Please contact Dr. Doylene Canard for acute cardiac issues.   Risk Assessment/Risk Scores:             For questions or updates, please contact  [facility] Please consult [email or web address] for contact info under    Signed, Sherren Mocha, MD  03/04/2021 7:41 AM

## 2021-02-13 NOTE — ED Notes (Signed)
Pt transferred to ICU with RT and NT assistance.  This RN assisted MD and NP with central line placement and arterial line placement and stabilizing pt throughout my shift with him.

## 2021-02-13 NOTE — Procedures (Signed)
Arterial Catheter Insertion Procedure Note [name] [id number] 10-Sep-1950  Procedure: Insertion of Arterial Catheter  Indications: Blood pressure monitoring and Frequent blood sampling  Procedure Details Consent: Unable to obtain consent because of emergent medical necessity. Time Out: Verified patient identification, verified procedure, site/side was marked, verified correct patient position, special equipment/implants available, medications/allergies/relevent history reviewed, required imaging and test results available.  Performed  Maximum sterile technique was used including antiseptics, cap, gloves, gown, hand hygiene, mask, and sheet. Skin prep: Chlorhexidine; local anesthetic administered 20 gauge catheter was inserted into left femoral artery using the Seldinger technique.  Evaluation Blood flow good; BP tracing good. Complications: No apparent complications.    Note cvl attempted rt femoral  and li left ij unable to thread wire on either. CxR ordered   Richardson Landry Veneta Sliter ACNP Maryanna Shape PCCM Pager [phone] till 3 pm If no answer page [phone] 03/03/2021, 9:54 AM

## 2021-02-13 NOTE — ED Notes (Signed)
1 amp D50 given per order

## 2021-02-13 NOTE — Progress Notes (Signed)
Initial Nutrition Assessment  DOCUMENTATION CODES:   Not applicable  INTERVENTION:   Initiate trickle tube feeds via Cortrak tube: - Vital 1.5 @ 20 ml/hr (480 ml/day)  Trickle tube feeding regimen provides 720 kcal, 32 grams of protein, and 367 ml of H2O.    Recommend advancing tube feeds to goal: - Vital 1.5 @ 55 ml/hr (1320 ml/day) - ProSource TF 45 ml TID  Tube feeding regimen at recommended goal rate provides 2100 kcal, 122 grams of protein, and 1008 ml of H2O.   - Renal MVI daily per tube  NUTRITION DIAGNOSIS:   Inadequate oral intake related to inability to eat as evidenced by NPO status.  GOAL:   Patient will meet greater than or equal to 90% of their needs  MONITOR:   Vent status, Labs, Weight trends, TF tolerance, I & O's  REASON FOR ASSESSMENT:   Ventilator, Consult Enteral/tube feeding initiation and management (trickle tube feeds)  ASSESSMENT:   70 year old male who presented to the ED on 12/09 with weakness and fatigue. PMH of ESRD on HD, recent stroke with prolonged admission to CIR, bilateral BKAs, CAD, dysphagia, T2DM, GERD, HLD, CHF, HTN. Pt admitted with STEMI, cardiopulmonary arrest. Pt required intubation.  12/09 - Cortrak placed (tip gastric)  Discussed pt with RN and during ICU rounds. Consult received for trickle tube feeding initiation and management. Per RN, pt brought up from ED without enteral access. Chest x-rays showing OG tube in trachea. Suspect this is why pt did not have enteral access on arrival to the ICU. Cortrak placed today, tip gastric per Cortrak team. Abdominal x-ray pending.  Discussed pt with MD. Start trickle feeds today only. Per MD, likely advance to goal rate tomorrow. RD will leave recommendations for goal tube feeding.  Reviewed weight history in chart. Pt weighed 91 kg on 06/26/20. Weight up to 104.5 kg on 01/25/21. Weight now back down to 90.7 kg today. Question accuracy of recent weights given 10 kg different. Suspect  weight may have appeared falsely elevated related to fluid status given pt with CHF, ESRD. Will monitor weight trends this admission. Unsure of pt's EDW.  Patient is currently intubated on ventilator support MV: 11.6 L/min Temp (24hrs), Avg:97.5 F (36.4 C), Min:97.4 F (36.3 C), Max:97.6 F (36.4 C) BP (a-line): 127/49 MAP (a-line): 64  Drips: Propofol: 1.6 ml/hr (provides 42 kcal daily from lipid) Fentanyl Heparin Levophed Sodium bicarb: off Vasopressin  Medications reviewed and include: colace, IV solu-cortef, IV protonix, miralax, IV abx, IV calcium gluconate 2 grams once  Labs reviewed: sodium 133, ionized calcium 0.80, elevated LFTs, hemoglobin 9.5 CBG's: 19-322 x 24 hours  NUTRITION - FOCUSED PHYSICAL EXAM:  Flowsheet Row Most Recent Value  Orbital Region No depletion  Upper Arm Region Mild depletion  Thoracic and Lumbar Region No depletion  Buccal Region Unable to assess  Temple Region No depletion  Clavicle Bone Region Mild depletion  Clavicle and Acromion Bone Region Mild depletion  Scapular Bone Region Unable to assess  Dorsal Hand No depletion  Patellar Region Mild depletion  Anterior Thigh Region Mild depletion  Posterior Calf Region Unable to assess  [bilateral BKAs]  Edema (RD Assessment) None  Hair Reviewed  Eyes Unable to assess  Mouth Unable to assess  Skin Reviewed  Nails Reviewed       Diet Order:   Diet Order             Diet NPO time specified  Diet effective now  EDUCATION NEEDS:   Not appropriate for education at this time  Skin:  Skin Assessment: Reviewed RN Assessment  Last BM:  no documented BM  Height:   Ht Readings from Last 1 Encounters:  03/01/2021 5\' 7"  (1.702 m)    Weight:   Wt Readings from Last 1 Encounters:  02/23/2021 90.7 kg    BMI:  35.7 kg/m^2  Estimated Nutritional Needs:   Kcal:  2000-2200  Protein:  120-140 grams  Fluid:  1000 ml + UOP    Gustavus Bryant, MS, RD,  LDN Inpatient Clinical Dietitian Please see AMiON for contact information.

## 2021-02-13 NOTE — Progress Notes (Signed)
Echocardiogram 2D Echocardiogram has been performed.  Oneal Deputy Rashaad Hallstrom RDCS 03/02/2021, 3:48 PM

## 2021-02-13 NOTE — ED Notes (Signed)
Pt vomiting around ET tube

## 2021-02-13 NOTE — Progress Notes (Signed)
Spoke with son, patient has not eaten in past 24-36 hours prior to admission, was passing out.  Sounds like hypoglycemic seizure followed by aspiration.  Progressively more unstable through shift, son is being called in.  Not sure he will live through night.  Additional pressors and bicarb added, will try to get on CRRT but doubtful Bps can tolerate.  Additional 40 min cc time Erskine Emery MD PCCM

## 2021-02-13 NOTE — Progress Notes (Signed)
Pt was transported to 3M10C via ventilator with no apparent complications. Report was given to ICU RT and Pt is currently stable.

## 2021-02-13 NOTE — ED Notes (Signed)
Pt cardiac monitor alarming at nursing station. Oxygen sats on room air at 74% with accurate pleth. Pt reports he "sometimes wears oxygen." Pt noted to have bowel movement. Brief change. Pt placed on 5L [place]

## 2021-02-13 NOTE — ED Triage Notes (Signed)
Pt reports general malaise and weakness. Dialysis T/TH/S/ Pt has hx of CVA and an MI. Pt recently here from 11/20 to 12/7. Pt was feeling weak and exhausted after dialysis. Pt stated to medic he took all his meds on an empty stomach. Pt did have a slide out of his wheelchair. Vitals per medic: 98/48, 94HR, 98% RA, 124 CBG.

## 2021-02-13 NOTE — Telephone Encounter (Signed)
Attempt to call [name] to schedule palliative follow up appt. No answer. Left message yesterday to schedule, awaiting callback

## 2021-02-13 NOTE — TOC Progression Note (Signed)
Transition of Care [facility]) - Initial/Assessment Note    Patient Details  Name: [name] MRN: [id number] Date of Birth: Apr 18, 1950  Transition of [facility] CM/SW Contact:    Milinda Antis, [place] Phone Number: 03/05/2021, 5:32 PM  Clinical Narrative:                  Transition of Care Department [facility]) has reviewed patient, due to patient's current grim prognosis, no TOC needs have been identified at this time. We will continue to monitor patient advancement through interdisciplinary progression rounds. If new patient transition needs arise, please place a TOC consult.  Lind Covert, LCSWA   Patient Goals and CMS Choice        Expected Discharge Plan and Services                                                Prior Living Arrangements/Services                       Activities of Daily Living      Permission Sought/Granted                  Emotional Assessment              Admission diagnosis:  Cardiac arrest ([name]) [I46.9] Hypoglycemia [E16.2] Acute coronary syndrome ([name]) [I24.9] Anemia associated with chronic renal failure [N18.9, D63.1] End-stage renal disease on hemodialysis ([name]) [N18.6, Z99.2] Cardiopulmonary arrest with successful resuscitation [facility]) [I46.9] Patient Active Problem List   Diagnosis Date Noted   Cardiac arrest ([name]) 02/16/2021   Cardiopulmonary arrest ([name])    Anemia of chronic disease 02/12/2021   Hyponatremia 02/12/2021   Slow transit constipation    ESRD (end stage renal disease) on dialysis ([place])    Left pontine stroke ([place]) 01/25/2021   Debility 01/25/2021   Acute kidney injury ([name])    S/P bilateral BKA (below knee amputation) ([name])    Thrombocytopenia ([name])    Controlled type 2 diabetes mellitus with hyperglycemia ([name])    Acute CVA (cerebrovascular accident) ([place])    Seizure ([name]) 01/22/2021   Benign prostatic hyperplasia    Ventricular tachycardia, non-sustained    ESRD needing dialysis  ([name])    Acute on chronic congestive heart failure ([place]) 01/11/2021   Phantom limb pain ([name]) 01/08/2021   History of diabetes mellitus 06/26/2020   Need for pneumococcal vaccination 06/26/2020   Goals of care, counseling/discussion 01/29/2019   CKD (chronic kidney disease), stage IV ([name]) 54/62/7035   Chronic systolic heart failure ([place]) 10/24/2012   Major depressive disorder, recurrent episode, moderate ([name]) 10/05/2012   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 [place] [address] Phone: [phone] Fax: [phone]     Social Determinants of Health (SDOH) Interventions    Readmission Risk Interventions Readmission Risk Prevention Plan 01/15/2021 02/02/2019  Transportation Screening Complete Complete  PCP or Specialist Appt within 5-7 Days - Complete  PCP or Specialist Appt within 3-5 Days Complete -  Home Care Screening - Complete  Medication Review (RN CM) - Complete  HRI or Home Care Consult Complete -  Social Work Consult for [place]  Planning/Counseling Complete -  Palliative Care Screening Complete -  Medication Review (RN Care Manager) Complete -  Some recent data might be hidden

## 2021-02-13 NOTE — ED Notes (Signed)
Pt noted to have soiled bed linens. RN and NT changed pt and linens

## 2021-02-13 NOTE — Progress Notes (Addendum)
Critical Lab Results:  ABG 7.13, 27.9, 113, 9.4 Glucose 34 Patient BP 64/25 on 40 levo and 0.03 vaso  -given one amp bicarb -given amp of dextrose -Increased Bicarb drip to 200 ml/hr -RR increased on vent to 28 -updated son over phone on patient's severity; states that he is on his way there -Repeat CBG q 1 hour -Repeat ABG at 2000 -Levo increased to 60; vaso 0.03; will add on epi drip  JD [facility] 02/06/2021, 5:10 PM  Please see [email or web address] for pager details.  From 7A-7P if no response, please call [phone]. After hours, please call ELink [phone].

## 2021-02-13 NOTE — ED Notes (Signed)
1 amp D50 per verbal order

## 2021-02-13 NOTE — Progress Notes (Addendum)
ANTICOAGULATION CONSULT NOTE  Pharmacy Consult for heparin Indication:  ACS  No Known Allergies  Patient Measurements: Height: 5\' 7"  (170.2 cm) Weight: 90.7 kg (200 lb) IBW/kg (Calculated) : 66.1 Heparin Dosing Weight: 85kg  Vital Signs: Temp: 97.4 F (36.3 C) (12/09 1229) Temp Source: Axillary (12/09 1229) BP: 134/48 (12/09 0953) Pulse Rate: 88 (12/09 1430)  Labs: Recent Labs    02/23/2021 0148 03/03/2021 0359 03/03/2021 0919 02/27/2021 1309  HGB 9.2*  --  9.5*  --   HCT 29.8*  --  28.0*  --   PLT 220  --   --   --   HEPARINUNFRC  --   --   --  0.28*  CREATININE 4.04*  --   --   --   TROPONINIHS 571* 782*  --   --      Estimated Creatinine Clearance: 18.3 mL/min (A) (by C-G formula based on SCr of 4.04 mg/dL (H)).  Assessment: 70yo male c/o general malaise and weakness, initially with O2 98% on room air but became hypoxic to 74% requiring 5L [place], noted to have had recent long-distance travel.  Pharmacy consulted for IV heparin for ACS.  CTA negative for PE.  Initial heparin level is slightly sub-therapeutic at 0.28 units/mL on 1300 units/hr.  RN reports minimal oozing from centra line.  Goal of Therapy:  Heparin level 0.3-0.7 units/ml Monitor platelets by anticoagulation protocol: Yes   Plan:  Increase heparin infusion to 1400 units/hr  Check 8 hr heparin level Daily heparin level and CBC Monitor for bleeding  Ally Knodel D. Mina Marble, PharmD, BCPS, [place] 02/16/2021, 2:38 PM

## 2021-02-13 NOTE — Progress Notes (Signed)
Echo attempted at 2:00, patient off the floor for other testing. [place]

## 2021-02-13 NOTE — Progress Notes (Signed)
Vent patient transported from 3M10 to CT Scan and back without any complications.

## 2021-02-13 NOTE — Progress Notes (Signed)
ANTICOAGULATION CONSULT NOTE - Initial Consult  Pharmacy Consult for heparin Indication:  concern for PE  No Known Allergies  Patient Measurements: Height: 5\' 7"  (170.2 cm) Weight: 90.7 kg (200 lb) IBW/kg (Calculated) : 66.1 Heparin Dosing Weight: 85kg  Vital Signs: Temp: 97.6 F (36.4 C) (12/09 0050) BP: 112/69 (12/09 0130) Pulse Rate: 101 (12/09 0050)  Labs: Recent Labs    02/10/21 1431  HGB 8.4*  HCT 25.6*  PLT 198  CREATININE 6.12*    Estimated Creatinine Clearance: 12.1 mL/min (A) (by C-G formula based on SCr of 6.12 mg/dL (H)).   Medical History: Past Medical History:  Diagnosis Date   CHF (congestive heart failure) (HCC)    Chronic Kidney Disease IV    Exertional shortness of breath    "before my last OR; I'm fine now" (09/26/2012)   GERD (gastroesophageal reflux disease)    High cholesterol    Hx of seasonal allergies    Hypertension    Myocardial infarction [facility])    Scrotal edema 01/30/2019   Stroke ([place]) 2009   memory loss   Type II diabetes mellitus ([place])     Assessment: 70yo male c/o general malaise and weakness, initially with O2 98% on room air but became hypoxic to 74% requiring 5L [place], noted to have had recent long-distance travel, awaiting possible VQ scan, to start heparin while ruling out PE.  Of note pt was recently started on HD (TTS).  Goal of Therapy:  Heparin level 0.3-0.7 units/ml Monitor platelets by anticoagulation protocol: Yes   Plan:  Heparin 3000 units IV bolus x1 followed by infusion at 1300 units/hr and monitor heparin levels and CBC.  Wynona Neat, PharmD, BCPS  03/07/2021,2:22 AM

## 2021-02-13 NOTE — Progress Notes (Signed)
Inpatient Diabetes Program Recommendations  AACE/ADA: New Consensus Statement on Inpatient Glycemic Control (2015)  Target Ranges:  Prepandial:   less than 140 mg/dL      Peak postprandial:   less than 180 mg/dL (1-2 hours)      Critically ill patients:  140 - 180 mg/dL   Lab Results  Component Value Date   GLUCAP 322 (H) 02/12/2021   HGBA1C 6.8 (H) 01/12/2021    Review of Glycemic Control  Latest Reference Range & Units 02/19/2021 05:59 02/25/2021 06:14 02/11/2021 06:27 03/07/2021 06:45 03/06/2021 07:12 03/07/2021 08:20  Glucose-Capillary 70 - 99 mg/dL 19 (LL) 34 (LL) 85 53 (L) 371 (H) 322 (H)  (LL): Data is critically low (L): Data is abnormally low (H): Data is abnormally high Diabetes history: Type 2 Dm Outpatient Diabetes medications: Rybelsus 3 mg QD Current orders for Inpatient glycemic control: none Solucortef 100 mg BID  Inpatient Diabetes Program Recommendations:    Consider adding Novolog 1-3 units Q4H.   Thanks, Bronson Curb, MSN, RNC-OB Diabetes Coordinator [phone] (8a-5p)

## 2021-02-13 NOTE — Consult Note (Signed)
[name] Admit Date: 03/05/2021 03/01/2021 [name] Requesting Physician:  Charlsie Quest MD  Reason for Consult:  ESRD, Cardiac Arrest, Acidosis HPI:  72M recent prolonged admission, DC from [facility] earlier this week as new ESRD ([place], [place] maturing LUE AVG), s/p CVA, CAD hx/o CABG presented to the ED feeling unwell, hypoxic and during evaluation became severely hypoglycemic and progressed to cardiac arrest requiring several rounds of CPR, intubation, high-dose vasopressors.  Transition to 77M with persistent hypoglycemia on norepinephrine.  CTA of the chest without PE but bilateral pneumonia present started on Unasyn.  ABG most recently with pH 7.14, PaCO2 28.    It appears patient had his first and only outpatient treatment yesterday where he left 2.6 kg above his EDW and received 3 hours and 37 minutes of a 4-hour treatment.  HD orders: THS, TDC, 4 hours, 400/800, 2K/2 calcium, heparin 4000 units every treatment.  Patient is comatose and unable to provide history.  Taken from chart review.  ROS: Unable to obtain  PMH  Past Medical History:  Diagnosis Date   CHF (congestive heart failure) (HCC)    Chronic Kidney Disease IV    Exertional shortness of breath    "before my last OR; I'm fine now" (09/26/2012)   GERD (gastroesophageal reflux disease)    High cholesterol    Hx of seasonal allergies    Hypertension    Myocardial infarction [facility])    Scrotal edema 01/30/2019   Stroke ([place]) 2009   memory loss   Type II diabetes mellitus ([place])    [place]  Past Surgical History:  Procedure Laterality Date   AMPUTATION Right 07/29/2012   Procedure: AMPUTATION 3RD TOE;  Surgeon: Kerin Salen, MD;  Location: [place];  Service: Orthopedics;  Laterality: Right;  right third toe amputation   AMPUTATION Right 08/02/2012   Procedure: right transmetatarsal amputation;  Surgeon: Kerin Salen, MD;  Location: [name];  Service: Orthopedics;  Laterality: Right;   AMPUTATION Right 09/06/2012   Procedure:  AMPUTATION BELOW KNEE;  Surgeon: Kerin Salen, MD;  Location: [place];  Service: Orthopedics;  Laterality: Right;   AMPUTATION Left 09/27/2012   Procedure: AMPUTATION BELOW KNEE;  Surgeon: Kerin Salen, MD;  Location: [place];  Service: Orthopedics;  Laterality: Left;   AV FISTULA PLACEMENT Left 01/19/2021   Procedure: ARTERIOVENOUS (AV) FISTULA OF LEFT ARM  USING GRAFT;  Surgeon: Angelia Mould, MD;  Location: [place];  Service: Vascular;  Laterality: Left;   CATARACT EXTRACTION W/ INTRAOCULAR LENS  IMPLANT, BILATERAL  ?2010   CORONARY ARTERY BYPASS GRAFT  2008   CABG X4   ESOPHAGOGASTRODUODENOSCOPY N/A 10/08/2012   Procedure: ESOPHAGOGASTRODUODENOSCOPY (EGD);  Surgeon: Winfield Cunas., MD;  Location: [facility] ENDOSCOPY;  Service: Endoscopy;  Laterality: N/A;   IR FLUORO GUIDE CV LINE RIGHT  01/14/2021   IR US GUIDE VASC ACCESS RIGHT  01/14/2021   PERIPHERALLY INSERTED CENTRAL CATHETER INSERTION Right 09/2012   upper arm   FH No family history on file. SH  reports that he has quit smoking. His smoking use included cigarettes. He has a 2.40 pack-year smoking history. He has never used smokeless tobacco. He reports that he does not drink alcohol and does not use drugs. Allergies No Known Allergies Home medications Prior to Admission medications   Medication Sig Start Date End Date Taking? Authorizing Provider  acetaminophen (TYLENOL) 325 MG tablet Take 1-2 tablets (325-650 mg total) by mouth every 4 (four) hours as needed for  mild pain. 02/11/21  Yes Love, Ivan Anchors, PA-C  amiodarone (PACERONE) 200 MG tablet Take 1 tablet (200 mg total) by mouth daily. Hold if HR < 55 02/11/21  Yes Love, Ivan Anchors, PA-C  atorvastatin (LIPITOR) 80 MG tablet Take 1 tablet (80 mg total) by mouth at bedtime. 02/11/21  Yes Love, Ivan Anchors, PA-C  clopidogrel (PLAVIX) 75 MG tablet Take 1 tablet (75 mg total) by mouth daily. 02/11/21  Yes Love, Ivan Anchors, PA-C  furosemide (LASIX) 80 MG tablet Take 80 mg by mouth 2 (two) times  daily. 01/30/21  Yes [provider]  gabapentin (NEURONTIN) 300 MG capsule Take 1 capsule (300 mg total) by mouth at bedtime. 01/08/21  Yes Katsadouros, Vasilios, MD  melatonin 5 MG TABS Take 1 tablet (5 mg total) by mouth at bedtime as needed. Patient taking differently: Take 5 mg by mouth at bedtime as needed (sleep). 02/11/21  Yes Love, Ivan Anchors, PA-C  multivitamin (RENA-VIT) TABS tablet Take 1 tablet by mouth at bedtime. 02/11/21  Yes Love, Ivan Anchors, PA-C  nitroGLYCERIN (NITROSTAT) 0.4 MG SL tablet Place 0.4 mg under the tongue every 5 (five) minutes as needed for chest pain.   Yes [provider]  oxyCODONE-acetaminophen (PERCOCET/ROXICET) 5-325 MG tablet Take 1 tablet by mouth daily as needed for severe pain. 02/11/21  Yes Love, Ivan Anchors, PA-C  pantoprazole (PROTONIX) 40 MG tablet Take 1 tablet (40 mg total) by mouth daily at 6 (six) AM. 12/21/18  Yes Charolette Forward, MD  polyethylene glycol (MIRALAX / GLYCOLAX) 17 g packet Take 17 g by mouth 2 (two) times daily. Patient taking differently: Take 17 g by mouth daily as needed for mild constipation. 02/11/21  Yes Love, Ivan Anchors, PA-C  prochlorperazine (COMPAZINE) 5 MG tablet Take 1-2 tablets (5-10 mg total) by mouth every 6 (six) hours as needed for nausea. 02/11/21  Yes Love, Ivan Anchors, PA-C  Semaglutide (RYBELSUS) 3 MG TABS Take 1 tablet (3 mg) by mouth daily. 01/25/21  Yes Gaylan Gerold, DO    Current Medications Scheduled Meds:  acetaminophen  650 mg Oral Q4H   Or   acetaminophen (TYLENOL) oral liquid 160 mg/5 mL  650 mg Per Tube Q4H   Or   acetaminophen  650 mg Rectal Q4H   Chlorhexidine Gluconate Cloth  6 each Topical Daily   docusate  100 mg Per Tube BID   feeding supplement (VITAL 1.5 CAL)  1,000 mL Per Tube Q24H   fentaNYL (SUBLIMAZE) injection  25 mcg Intravenous Once   hydrocortisone sod succinate (SOLU-CORTEF) inj  100 mg Intravenous Q12H   multivitamin  1 tablet Per Tube QHS   pantoprazole (PROTONIX) IV  40 mg  Intravenous QHS   polyethylene glycol  17 g Per Tube Daily   sodium bicarbonate  100 mEq Intravenous Once   Continuous Infusions:  sodium chloride     sodium chloride     ampicillin-sulbactam (UNASYN) IV Stopped (03/06/2021 1410)   calcium gluconate     fentaNYL infusion INTRAVENOUS     heparin 1,300 Units/hr (02/21/2021 1600)   norepinephrine (LEVOPHED) Adult infusion     propofol (DIPRIVAN) infusion 5 mcg/kg/min (02/12/2021 1600)   sodium bicarbonate 150 mEq in D5W infusion Stopped (02/23/2021 1018)   vasopressin     PRN Meds:.acetaminophen, docusate, fentaNYL, ondansetron (ZOFRAN) IV, polyethylene glycol  CBC Recent Labs  Lab 02/07/21 1348 02/10/21 1431 03/02/2021 0148 02/08/2021 0919 03/04/2021 1626  WBC 5.9 6.5 10.7*  --   --   NEUTROABS  --   --  9.0*  --   --   HGB 7.8* 8.4* 9.2* 9.5* 9.2*  HCT 24.3* 25.6* 29.8* 28.0* 27.0*  MCV 92.4 89.8 96.4  --   --   PLT 195 198 220  --   --    Basic Metabolic Panel Recent Labs  Lab 02/07/21 1348 02/10/21 1431 03/06/2021 0148 02/08/2021 0919 02/26/2021 1626  NA 128* 123* 135 133* 132*  K 3.9 4.6 4.6 4.3 4.0  CL 92* 88* 93*  --   --   CO2 26 22 12*  --   --   GLUCOSE 131* 107* 70  --   --   BUN 30* 50* 22  --   --   CREATININE 4.78* 6.12* 4.04*  --   --   CALCIUM 7.8* 8.0* 8.2*  --   --   PHOS 3.9 5.0*  --   --   --     Physical Exam   Blood pressure (!) 134/48, pulse 75, temperature (!) 97.4 F (36.3 C), temperature source Axillary, resp. rate 18, height 5\' 7"  (1.702 m), weight 90.7 kg, SpO2 97 %. GEN: Intubated, sedated, chronically ill-appearing ENT: Temporal wasting EYES: Eyes closed CV: Regular PULM: Coarse breath sounds bilaterally ABD: Soft, nontender SKIN: No obvious rashes, right IJ TDC C/D/I EXT: Minimal edema  Assessment 5M ESRD s/p cardiac arrest in ED now VDRF, severe metabolic acidosis, shock, pneumonia.  ESRD THS TDC, maturing LUE AVG, [place], started outpt 02/12/21 Cardiac arrest 12/9 in ED Severe hypoglycemia,  unclear etiology, likely sepsis Shock, likely sepsis, imaging with multilobar pneumonia, on norepinephrine Metabolic acidosis, likely lactic Anemia, hemoglobin stable nines  Plan Start CRRT: all 4K, 500/1500/300, start with no UF, no heparin but would add fixed dose as needed Will follow along  [name]  03/07/2021, 4:51 PM

## 2021-02-13 NOTE — ED Provider Notes (Signed)
[facility] EMERGENCY DEPARTMENT Provider Note   CSN: [id number] Arrival date & time: 02/18/2021  0049     History Chief Complaint  Patient presents with   Weakness   Fatigue    [name] is a 70 y.o. male.  The history is provided by the patient.  Weakness He has history of hypertension, diabetes, hyperlipidemia, stroke, myocardial infarction, chronic kidney disease on hemodialysis and comes in complaining of feeling generally weak and short of breath.  He states that he just arrived here this afternoon having traveled by bus from dialysis.  He denies chest pain, heaviness, tightness, pressure.  He denies cough.  He denies fever, chills, sweats.  He denies nausea or vomiting.  Of note, he has received COVID-19 vaccinations and influenza vaccination.   Past Medical History:  Diagnosis Date   CHF (congestive heart failure) (HCC)    Chronic Kidney Disease IV    Exertional shortness of breath    "before my last OR; I'm fine now" (09/26/2012)   GERD (gastroesophageal reflux disease)    High cholesterol    Hx of seasonal allergies    Hypertension    Myocardial infarction ([place])    Scrotal edema 01/30/2019   Stroke ([place]) 2009   memory loss   Type II diabetes mellitus ([place])     Patient Active Problem List   Diagnosis Date Noted   Anemia of chronic disease 02/12/2021   Hyponatremia 02/12/2021   Slow transit constipation    ESRD (end stage renal disease) on dialysis ([place])    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   Debility 01/25/2021   Acute kidney injury ([place])    S/P bilateral BKA (below knee amputation) ([place])    Thrombocytopenia ([place])    Controlled type 2 diabetes mellitus with hyperglycemia ([place])    Acute CVA (cerebrovascular accident) ([place])    Seizure ([place]) 01/22/2021   Benign prostatic hyperplasia    Ventricular tachycardia, non-sustained    ESRD needing dialysis ([place])    Acute on chronic congestive heart failure ([place]) 01/11/2021   Phantom limb  pain ([place]) 01/08/2021   History of diabetes mellitus 06/26/2020   Need for pneumococcal vaccination 06/26/2020   Goals of care, counseling/discussion 01/29/2019   CKD (chronic kidney disease), stage IV ([place]) 42/59/5638   Chronic systolic heart failure ([place]) 10/24/2012   Major depressive disorder, recurrent episode, moderate ([place]) 10/05/2012   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   H/O: CVA (cerebrovascular accident) 07/27/2012   Hypertension 07/27/2012   Normocytic anemia 07/27/2012    Past Surgical History:  Procedure Laterality Date   AMPUTATION Right 07/29/2012   Procedure: AMPUTATION 3RD TOE;  Surgeon: Kerin Salen, MD;  Location: [facility];  Service: Orthopedics;  Laterality: Right;  right third toe amputation   AMPUTATION Right 08/02/2012   Procedure: right transmetatarsal amputation;  Surgeon: Kerin Salen, MD;  Location: [facility];  Service: Orthopedics;  Laterality: Right;   AMPUTATION Right 09/06/2012   Procedure: AMPUTATION BELOW KNEE;  Surgeon: Kerin Salen, MD;  Location: [facility];  Service: Orthopedics;  Laterality: Right;   AMPUTATION Left 09/27/2012   Procedure: AMPUTATION BELOW KNEE;  Surgeon: Kerin Salen, MD;  Location: [facility];  Service: Orthopedics;  Laterality: Left;   AV FISTULA PLACEMENT Left 01/19/2021   Procedure: ARTERIOVENOUS (AV) FISTULA OF LEFT ARM  USING GRAFT;  Surgeon: Angelia Mould, MD;  Location: [facility];  Service: Vascular;  Laterality: Left;   CATARACT EXTRACTION W/ INTRAOCULAR LENS  IMPLANT, BILATERAL  ?  2010   CORONARY ARTERY BYPASS GRAFT  2008   CABG X4   ESOPHAGOGASTRODUODENOSCOPY N/A 10/08/2012   Procedure: ESOPHAGOGASTRODUODENOSCOPY (EGD);  Surgeon: Winfield Cunas., MD;  Location[facility];  Service: Endoscopy;  Laterality: N/A;   IR FLUORO GUIDE CV LINE RIGHT  01/14/2021   IR US GUIDE VASC ACCESS RIGHT  01/14/2021   PERIPHERALLY INSERTED CENTRAL CATHETER INSERTION Right 09/2012   upper arm       No family history on file.  Social History   Tobacco  Use   Smoking status: Former    Packs/day: 0.12    Years: 20.00    Pack years: 2.40    Types: Cigarettes   Smokeless tobacco: Never  Vaping Use   Vaping Use: Never used  Substance Use Topics   Alcohol use: No    Comment: last drink 2010   Drug use: No    Home Medications Prior to Admission medications   Medication Sig Start Date End Date Taking? Authorizing Provider  acetaminophen (TYLENOL) 325 MG tablet Take 1-2 tablets (325-650 mg total) by mouth every 4 (four) hours as needed for mild pain. 02/11/21   Love, Ivan Anchors, PA-C  amiodarone (PACERONE) 200 MG tablet Take 1 tablet (200 mg total) by mouth daily. Hold if HR < 55 02/11/21   Love, Pamela S, PA-C  atorvastatin (LIPITOR) 80 MG tablet Take 1 tablet (80 mg total) by mouth at bedtime. 02/11/21   Love, Ivan Anchors, PA-C  clopidogrel (PLAVIX) 75 MG tablet Take 1 tablet (75 mg total) by mouth daily. 02/11/21   Love, Ivan Anchors, PA-C  Cyanocobalamin (VITAMIN B-12 PO) Take 1 tablet by mouth daily.    [provider]  gabapentin (NEURONTIN) 300 MG capsule Take 1 capsule (300 mg total) by mouth at bedtime. 01/08/21   Katsadouros, Vasilios, MD  melatonin 5 MG TABS Take 1 tablet (5 mg total) by mouth at bedtime as needed. 02/11/21   Love, Ivan Anchors, PA-C  multivitamin (RENA-VIT) TABS tablet Take 1 tablet by mouth at bedtime. 02/11/21   Love, Ivan Anchors, PA-C  nitroGLYCERIN (NITROSTAT) 0.4 MG SL tablet Place 0.4 mg under the tongue every 5 (five) minutes as needed for chest pain.    [provider]  oxyCODONE-acetaminophen (PERCOCET/ROXICET) 5-325 MG tablet Take 1 tablet by mouth daily as needed for severe pain. 02/11/21   Love, Ivan Anchors, PA-C  pantoprazole (PROTONIX) 40 MG tablet Take 1 tablet (40 mg total) by mouth daily at 6 (six) AM. 12/21/18   Charolette Forward, MD  polyethylene glycol (MIRALAX / GLYCOLAX) 17 g packet Take 17 g by mouth 2 (two) times daily. 02/11/21   Love, Ivan Anchors, PA-C  prochlorperazine (COMPAZINE) 5 MG tablet Take 1-2  tablets (5-10 mg total) by mouth every 6 (six) hours as needed for nausea. 02/11/21   Love, Ivan Anchors, PA-C  Semaglutide (RYBELSUS) 3 MG TABS Take 1 tablet (3 mg) by mouth daily. 01/25/21   Gaylan Gerold, DO    Allergies    Patient has no known allergies.  Review of Systems   Review of Systems  Neurological:  Positive for weakness.  All other systems reviewed and are negative.  Physical Exam Updated Vital Signs BP (!) 98/45   Pulse (!) 101   Temp 97.6 F (36.4 C)   Resp 17   Ht _0  (1.702 m)   Wt 90.7 kg   BMI 31.32 kg/m   Physical Exam Vitals and nursing note reviewed.  70 year old male,  appears mildly dyspneic, but is in no acute distress. Vital signs are significant for borderline low blood pressure, borderline elevated heart rate. Oxygen saturation is 75%, which is hypoxic Head is normocephalic and atraumatic. PERRLA, EOMI. Oropharynx is clear. Neck is nontender and supple without adenopathy or JVD. Back is nontender and there is no CVA tenderness. Lungs are clear without rales, wheezes, or rhonchi. Chest is nontender.  Dialysis access catheter is present in the right subclavian area. Heart has regular rate and rhythm without murmur. Abdomen is soft, flat, nontender without masses or hepatosplenomegaly and peristalsis is normoactive. Extremities: Bilateral below the knee amputations.  AV fistula present in the left antecubital area with thrill present. Skin is warm and dry without rash. Neurologic: Mental status is normal, cranial nerves are intact, moves all extremities equally.  ED Results / Procedures / Treatments   Labs (all labs ordered are listed, but only abnormal results are displayed) Labs Reviewed  CBC WITH DIFFERENTIAL/PLATELET - Abnormal; Notable for the following components:      Result Value   WBC 10.7 (*)    RBC 3.09 (*)    Hemoglobin 9.2 (*)    HCT 29.8 (*)    RDW 18.4 (*)    nRBC 0.3 (*)    Neutro Abs 9.0 (*)    Abs Immature Granulocytes 0.09 (*)     All other components within normal limits  COMPREHENSIVE METABOLIC PANEL - Abnormal; Notable for the following components:   Chloride 93 (*)    CO2 12 (*)    Creatinine, Ser 4.04 (*)    Calcium 8.2 (*)    Albumin 2.9 (*)    AST 1,061 (*)    ALT 289 (*)    Alkaline Phosphatase 149 (*)    Total Bilirubin 2.5 (*)    GFR, Estimated 15 (*)    Anion gap 30 (*)    All other components within normal limits  BRAIN NATRIURETIC PEPTIDE - Abnormal; Notable for the following components:   B Natriuretic Peptide >4,500.0 (*)    All other components within normal limits  CBG MONITORING, ED - Abnormal; Notable for the following components:   Glucose-Capillary 19 (*)    All other components within normal limits  CBG MONITORING, ED - Abnormal; Notable for the following components:   Glucose-Capillary 34 (*)    All other components within normal limits  CBG MONITORING, ED - Abnormal; Notable for the following components:   Glucose-Capillary 53 (*)    All other components within normal limits  TROPONIN I (HIGH SENSITIVITY) - Abnormal; Notable for the following components:   Troponin I (High Sensitivity) 571 (*)    All other components within normal limits  TROPONIN I (HIGH SENSITIVITY) - Abnormal; Notable for the following components:   Troponin I (High Sensitivity) 782 (*)    All other components within normal limits  RESP PANEL BY RT-PCR (FLU A&B, COVID) ARPGX2  URINALYSIS, ROUTINE W REFLEX MICROSCOPIC  HEPARIN LEVEL (UNFRACTIONATED)  CBG MONITORING, ED    EKG TARIG, ZIMMERS BH:[id number] 13-Feb-2021 04:17:59 [facility]/ED ROUTINE RECORD [id number] (76 yr) Male Black Room:[id number] Loc:11 Technician: [phone] Test ind: Vent. rate 96 BPM PR interval 170 ms QRS duration 206 ms QT/QTcB 500/632 ms P-R-T axes 150 136 -31 Sinus or ectopic atrial rhythm Ventricular premature complex Right bundle branch block When compared with ECG of 01/22/2021, No significant change was  found Confirmed by Delora Fuel ([id number]) on 02/17/2021 4:36:55 AM Confirmed By: Delora Fuel  EKG Interpretation  Date/Time:  Friday February 13 2021 06:34:48 EST Ventricular Rate:  105 PR Interval:  128 QRS Duration: 131 QT Interval:  434 QTC Calculation: 574 R Axis:   144 Text Interpretation: Sinus tachycardia Consider right atrial enlargement Nonspecific intraventricular conduction delay Probable inferior infarct, acute Anterior infarct, age indeterminate Baseline wander in lead(s) V1 When compared with ECG of EARLIER SAME DATE ** ** ACUTE MI / STEMI ** ** is now present Confirmed by Delora Fuel ([id number]) on 02/08/2021 6:52:22 AM  Radiology DG Chest Port 1 View  Result Date: 02/26/2021 CLINICAL DATA:  Shortness of breath. EXAM: PORTABLE CHEST 1 VIEW COMPARISON:  02/06/2021. FINDINGS: The heart is enlarged there is evidence of prior cardiothoracic surgery. Atherosclerotic calcification of the aorta is noted. The pulmonary vasculature is within normal limits. Lung volumes are low. Linear atelectasis or scarring is noted in the mid right lung and unchanged from the previous exam. No effusion or pneumothorax. A stable right internal jugular central venous catheter is noted. IMPRESSION: Cardiomegaly with no acute cardiopulmonary process. Electronically Signed   By: Brett Fairy M.D.   On: 02/10/2021 01:47    Procedures Procedure Name: Intubation Date/Time: 02/26/2021 7:04 AM Performed by: Delora Fuel, MD Pre-anesthesia Checklist: Patient identified, Patient being monitored, Emergency Drugs available, Suction available and Timeout performed Oxygen Delivery Method: Ambu bag Preoxygenation: Pre-oxygenation with 100% oxygen Ventilation: Mask ventilation without difficulty Laryngoscope Size: Mac and 3 Grade View: Grade I Tube size: 7.5 mm Number of attempts: 1 Airway Equipment and Method: Rigid stylet Placement Confirmation: ETT inserted through vocal cords under direct vision, Breath sounds  checked- equal and bilateral and CO2 detector Secured at: 25 cm Tube secured with: ETT holder Dental Injury: Teeth and Oropharynx as per pre-operative assessment     CRITICAL CARE Performed by: Delora Fuel Total critical care time: 120 minutes Critical care time was exclusive of separately billable procedures and treating other patients. Critical care was necessary to treat or prevent imminent or life-threatening deterioration. Critical care was time spent personally by me on the following activities: development of treatment plan with patient and/or surrogate as well as nursing, discussions with consultants, evaluation of patient's response to treatment, examination of patient, obtaining history from patient or surrogate, ordering and performing treatments and interventions, ordering and review of laboratory studies, ordering and review of radiographic studies, pulse oximetry and re-evaluation of patient's condition.  Cardiopulmonary Resuscitation (CPR) Procedure Note Directed/Performed by: Delora Fuel I personally directed ancillary staff and/or performed CPR in an effort to regain return of spontaneous circulation and to maintain cardiac, neuro and systemic perfusion.    Medications Ordered in ED Medications  heparin ADULT infusion 100 units/mL (25000 units/255m) (1,300 Units/hr Intravenous New Bag/Given 03/06/2021 0423)  dextrose 50 % solution (has no administration in time range)  dextrose 50 % solution (has no administration in time range)  dextrose 50 % solution (has no administration in time range)  ketamine HCl 50 MG/5ML SOSY (has no administration in time range)  rocuronium bromide 100 MG/10ML SOSY (has no administration in time range)  succinylcholine (ANECTINE) 200 MG/10ML syringe (has no administration in time range)  etomidate (AMIDATE) 2 MG/ML injection (has no administration in time range)  EPINEPHrine (ADRENALIN) 1 MG/10ML injection (1 mg Intravenous Given 03/04/2021 0630)   sodium bicarbonate injection (50 mEq Intravenous Given 02/09/2021 0629)  LORazepam (ATIVAN) 2 MG/ML injection (has no administration in time range)  dextrose 50 % solution (1 ampule Intravenous Given 02/09/2021 0652)  dextrose 50 % solution (has no administration in time range)  dextrose 10 % infusion ( Intravenous New Bag/Given 02/16/2021 0655)  heparin bolus via infusion 3,000 Units (3,000 Units Intravenous Bolus from Bag 02/26/2021 0424)  LORazepam (ATIVAN) injection 1 mg (1 mg Intravenous Given 02/21/2021 0634)  norepinephrine (LEVOPHED) 4-5 MG/250ML-% infusion SOLN (  New Bag/Given 02/27/2021 0647)  aspirin suppository 300 mg (300 mg Rectal Given 02/06/2021 6222)    ED Course  I have reviewed the triage vital signs and the nursing notes.  Pertinent labs & imaging results that were available during my care of the patient were reviewed by me and considered in my medical decision making (see chart for details).   MDM Rules/Calculators/A&P                         Weakness and dyspnea with hypoxia.  With long distance travel, I am concerned about possibility of pulmonary embolism.  Also, consider heart failure, pneumonia, viral infection such as influenza or COVID-19.  Old records are reviewed showing recent hospitalization for stroke.  He is noted to be significantly hypoxic and is placed on a nonrebreather mask with only modest improvements in oxygenation.  Will check ABG to confirm hypoxia.  He was recently started on dialysis, so was not a candidate for CT angiogram.  May need to proceed with nuclear medicine lung scan.  Because of concern for delay in getting lung scan, patient is started on heparin.  Troponin has come back elevated consistent with non-STEMI, patient already anticoagulated.  Aspirin is added.  Pages were put out for admission, but before they were answered, patient had an episode of hypoglycemia and was given intravenous dextrose.  Sugar came up only modestly and patient was noted to be  developing agonal respirations.  He eventually went to a respiratory arrest and cardiac arrest and CPR was initiated.  Patient was intubated.  He needed multiple doses of dextrose to get an adequate glucose level and eventually was started on continuous dextrose infusion.  Spontaneous circulation was reestablished, ECG repeated now showing inferior wall STEMI so code STEMI is activated.  Dr. Burt Knack has evaluated the patient and reviewed ECGs, does not feel he is a candidate for emergent PCI.  Case is discussed with critical care service who agrees to admit the patient.  Final Clinical Impression(s) / ED Diagnoses Final diagnoses:  Acute coronary syndrome (HCC)  Hypoglycemia  End-stage renal disease on hemodialysis ([place])  Anemia associated with chronic renal failure  Cardiopulmonary arrest with successful resuscitation [facility])    Rx / DC Orders ED Discharge Orders     None        Delora Fuel, MD 97/98/92 [phone]

## 2021-02-13 NOTE — H&P (Addendum)
NAME:  [name], MRN:  [id number], DOB:  04-25-1950, LOS: 0 ADMISSION DATE:  02/12/2021, CONSULTATION DATE:  02/15/2021 REFERRING MD:  EDP, CHIEF COMPLAINT:  cardiac arrest   History of Present Illness:  History per chart review as patient is comatose.  70 year old man with hx of CAD, ESRD on HD presenting with feeling unwell, hypoxemia.  While in ER developed severe symptomatic hypoglycemia then had cardiac arrest.  Intubated and given more dextrose. Now agonally breathing on vent, on pressors with poor pleth.  PCCM consulted for admission.  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 rate 17, height 5\' 7"  (1.702 m), weight 90.7 kg, SpO2 96 %.    Vent Mode: PRVC FiO2 (%):  [100 %] 100 % Set Rate:  [20 bmp] 20 bmp Vt Set:  [530 mL-550 mL] 530 mL PEEP:  [5 cmH20] 5 cmH20 Plateau Pressure:  [22 cmH20] 22 cmH20  No intake or output data in the 24 hours ending 03/03/2021 5093 Filed Weights   02/08/2021 0100  Weight: 90.7 kg    Examination: General: ill appearing man agonally breathing on vent HENT: ETT in place, small thick secretions Lungs: rhonci bilaterally, fighting against vent Cardiovascular: RRR, ext lukewarm Abdomen: soft, hypoactive BS Extremities: BL BKA, not much edema Neuro: GCS 3, pupils are equal/reactive, corneals intact, cough/gag present Skin: New tunneled R HD cath CDI POCUS: no pericardial effusion, biventricular dysfunction  looks stable from prior  CXR: low ETT, malpositioned OGT  Resolved Hospital Problem list   N/a  Assessment & Plan:  IHCA in setting of hypoglycemia, hypoxemia, transaminitis, elevated trop- r/o VTE, r/o ACS, r/o seizure, not cath candidate at present  Acute hypoxemic respiratory failure in setting of cardiac arrest, likely aspiration  Baseline ESRD on HD, CHF, bilateral amputee uses prostheses, recent admit for left pontine stroke  - Stat CTA chest, CT head - TTM2 37C protocol - LTVEEG - Levophed, vasopressin titrated to MAP 65 - Stress steroids - Heparin gtt for now - Switch fluids to D5 bicarb - Renal consult, will need CRRT at some point - Guarded prognosis  Best Practice (right click and "Reselect all SmartList Selections" daily)   Diet/type: NPO DVT prophylaxis: systemic heparin GI prophylaxis: PPI Lines: Central line Foley: n/a Code Status:  full code Last date of multidisciplinary goals of care discussion [pending]  Labs   CBC: Recent Labs  Lab 02/07/21 1348 02/10/21 1431 02/15/2021 0148  WBC 5.9 6.5 10.7*  NEUTROABS  --   --  9.0*  HGB 7.8* 8.4* 9.2*  HCT 24.3* 25.6* 29.8*  MCV 92.4 89.8 96.4  PLT 195 198 267    Basic Metabolic Panel: Recent Labs  Lab 02/07/21 1348 02/10/21 1431 03/06/2021 0148  NA 128* 123* 135  K 3.9 4.6 4.6  CL 92* 88* 93*  CO2 26 22 12*  GLUCOSE 131* 107* 70  BUN 30* 50* 22  CREATININE 4.78* 6.12* 4.04*  CALCIUM 7.8* 8.0* 8.2*  PHOS 3.9 5.0*  --  GFR: Estimated Creatinine Clearance: 18.3 mL/min (A) (by C-G formula based on SCr of 4.04 mg/dL (H)). Recent Labs  Lab 02/07/21 1348 02/10/21 1431 02/11/2021 0148  WBC 5.9 6.5 10.7*    Liver Function Tests: Recent Labs  Lab 02/07/21 1348 02/10/21 1431 03/07/2021 0148  AST  --   --  1,061*  ALT  --   --  289*  ALKPHOS  --   --  149*  BILITOT  --   --  2.5*  PROT  --   --  6.7  ALBUMIN 2.4* 2.5* 2.9*   No results for input(s): LIPASE, AMYLASE in the last 168  hours. No results for input(s): AMMONIA in the last 168 hours.  ABG    Component Value Date/Time   PHART 7.345 (L) 01/22/2021 1902   PCO2ART 48.2 (H) 01/22/2021 1902   PO2ART 24 (LL) 01/22/2021 1902   HCO3 26.3 01/22/2021 1902   TCO2 28 01/22/2021 1902   ACIDBASEDEF 6.0 (H) 01/12/2021 1513   O2SAT 39.0 01/22/2021 1902     Coagulation Profile: No results for input(s): INR, PROTIME in the last 168 hours.  Cardiac Enzymes: No results for input(s): CKTOTAL, CKMB, CKMBINDEX, TROPONINI in the last 168 hours.  HbA1C: Hemoglobin A1C  Date/Time Value Ref Range Status  06/26/2020 09:27 AM 6.0 (A) 4.0 - 5.6 % Final  07/31/2019 02:09 PM 5.2 4.0 - 5.6 % Final   Hgb A1c MFr Bld  Date/Time Value Ref Range Status  01/12/2021 06:02 AM 6.8 (H) 4.8 - 5.6 % Final    Comment:    (NOTE) Pre diabetes:          5.7%-6.4%  Diabetes:              >6.4%  Glycemic control for   <7.0% adults with diabetes   12/18/2018 11:11 PM 5.4 4.8 - 5.6 % Final    Comment:    (NOTE) Pre diabetes:          5.7%-6.4% Diabetes:              >6.4% Glycemic control for   <7.0% adults with diabetes     CBG: Recent Labs  Lab 03/06/2021 0559 03/06/2021 0614 02/28/2021 0627 02/05/2021 0645 02/12/2021 0712  GLUCAP 19* 34* 85 53* 371*    Review of Systems:   comatose  Past Medical History:  He,  has a past medical history of CHF (congestive heart failure) ([place]), Chronic Kidney Disease IV, Exertional shortness of breath, GERD (gastroesophageal reflux disease), High cholesterol, seasonal allergies, Hypertension, Myocardial infarction [facility]), Scrotal edema (01/30/2019), Stroke ([place]) (2009), and Type II diabetes mellitus ([place]).   Surgical History:   Past Surgical History:  Procedure Laterality Date   AMPUTATION Right 07/29/2012   Procedure: AMPUTATION 3RD TOE;  Surgeon: Kerin Salen, MD;  Location: [place];  Service: Orthopedics;  Laterality: Right;  right third toe amputation   AMPUTATION Right 08/02/2012    Procedure: right transmetatarsal amputation;  Surgeon: Kerin Salen, MD;  Location: [place];  Service: Orthopedics;  Laterality: Right;   AMPUTATION Right 09/06/2012   Procedure: AMPUTATION BELOW KNEE;  Surgeon: Kerin Salen, MD;  Location: [place];  Service: Orthopedics;  Laterality: Right;   AMPUTATION Left 09/27/2012   Procedure: AMPUTATION BELOW KNEE;  Surgeon: Kerin Salen, MD;  Location: [place];  Service: Orthopedics;  Laterality: Left;   AV FISTULA PLACEMENT Left 01/19/2021   Procedure: ARTERIOVENOUS (AV) FISTULA OF LEFT ARM  USING GRAFT;  Surgeon: Angelia Mould,  MD;  Location: [place];  Service: Vascular;  Laterality: Left;   CATARACT EXTRACTION W/ INTRAOCULAR LENS  IMPLANT, BILATERAL  ?2010   CORONARY ARTERY BYPASS GRAFT  2008   CABG X4   ESOPHAGOGASTRODUODENOSCOPY N/A 10/08/2012   Procedure: ESOPHAGOGASTRODUODENOSCOPY (EGD);  Surgeon: Winfield Cunas., MD;  Location[facility] ENDOSCOPY;  Service: Endoscopy;  Laterality: N/A;   IR FLUORO GUIDE CV LINE RIGHT  01/14/2021   IR US GUIDE VASC ACCESS RIGHT  01/14/2021   PERIPHERALLY INSERTED CENTRAL CATHETER INSERTION Right 09/2012   upper arm     Social History:   reports that he has quit smoking. His smoking use included cigarettes. He has a 2.40 pack-year smoking history. He has never used smokeless tobacco. He reports that he does not drink alcohol and does not use drugs.   Family History:  His family history is not on file.   Allergies No Known Allergies   Home Medications  Prior to Admission medications   Medication Sig Start Date End Date Taking? Authorizing Provider  acetaminophen (TYLENOL) 325 MG tablet Take 1-2 tablets (325-650 mg total) by mouth every 4 (four) hours as needed for mild pain. 02/11/21  Yes Love, Ivan Anchors, PA-C  amiodarone (PACERONE) 200 MG tablet Take 1 tablet (200 mg total) by mouth daily. Hold if HR < 55 02/11/21  Yes Love, Ivan Anchors, PA-C  atorvastatin (LIPITOR) 80 MG tablet Take 1 tablet (80 mg total) by mouth  at bedtime. 02/11/21  Yes Love, Ivan Anchors, PA-C  clopidogrel (PLAVIX) 75 MG tablet Take 1 tablet (75 mg total) by mouth daily. 02/11/21  Yes Love, Ivan Anchors, PA-C  furosemide (LASIX) 80 MG tablet Take 80 mg by mouth 2 (two) times daily. 01/30/21  Yes [provider]  gabapentin (NEURONTIN) 300 MG capsule Take 1 capsule (300 mg total) by mouth at bedtime. 01/08/21  Yes Katsadouros, Vasilios, MD  melatonin 5 MG TABS Take 1 tablet (5 mg total) by mouth at bedtime as needed. Patient taking differently: Take 5 mg by mouth at bedtime as needed (sleep). 02/11/21  Yes Love, Ivan Anchors, PA-C  multivitamin (RENA-VIT) TABS tablet Take 1 tablet by mouth at bedtime. 02/11/21  Yes Love, Ivan Anchors, PA-C  nitroGLYCERIN (NITROSTAT) 0.4 MG SL tablet Place 0.4 mg under the tongue every 5 (five) minutes as needed for chest pain.   Yes [provider]  oxyCODONE-acetaminophen (PERCOCET/ROXICET) 5-325 MG tablet Take 1 tablet by mouth daily as needed for severe pain. 02/11/21  Yes Love, Ivan Anchors, PA-C  pantoprazole (PROTONIX) 40 MG tablet Take 1 tablet (40 mg total) by mouth daily at 6 (six) AM. 12/21/18  Yes Charolette Forward, MD  polyethylene glycol (MIRALAX / GLYCOLAX) 17 g packet Take 17 g by mouth 2 (two) times daily. Patient taking differently: Take 17 g by mouth daily as needed for mild constipation. 02/11/21  Yes Love, Ivan Anchors, PA-C  prochlorperazine (COMPAZINE) 5 MG tablet Take 1-2 tablets (5-10 mg total) by mouth every 6 (six) hours as needed for nausea. 02/11/21  Yes Love, Ivan Anchors, PA-C  Semaglutide (RYBELSUS) 3 MG TABS Take 1 tablet (3 mg) by mouth daily. 01/25/21  Yes Gaylan Gerold, DO     Critical care time: 45 minutes

## 2021-02-13 NOTE — Procedures (Signed)
Cortrak ? ?Tube Type:  Cortrak - 43 inches ?Tube Location:  Right nare ?Initial Placement:  Stomach ?Secured by: Bridle ?Technique Used to Measure Tube Placement:  Marking at nare/corner of mouth ?Cortrak Secured At:  70 cm ? ?Cortrak Tube Team Note: ? ?Consult received to place a Cortrak feeding tube.  ? ?X-ray is required, abdominal x-ray has been ordered by the Cortrak team. Please confirm tube placement before using the Cortrak tube.  ? ?If the tube becomes dislodged please keep the tube and contact the Cortrak team at [email or web address] (password [email or web address]) for replacement.  ?If after hours and replacement cannot be delayed, place a NG tube and confirm placement with an abdominal x-ray.  ? ? ?Raider Valbuena MS, RD, LDN ?Please refer to AMION for RD and/or RD on-call/weekend/after hours pager ? ? ?

## 2021-02-13 NOTE — ED Notes (Signed)
A total of 6 amp of bicarb given to pt.

## 2021-02-13 NOTE — Progress Notes (Signed)
An USGPIV (ultrasound guided PIV) has been placed for short-term vasopressor infusion. Should this treatment be needed beyond 48hours, best practice supports seeking central line access.  It will be the responsibility of the bedside nurse to follow best practice to prevent extravasations including placing the IVWatch when indicated.   

## 2021-02-13 NOTE — Progress Notes (Signed)
eLink Physician-Brief Progress Note Patient Name: [name] DOB: 04/22/1950 MRN: [id number]   Date of Service  02/05/2021  HPI/Events of Note  Ongoing severe shock and acidemia on high dose vasopressor support, CRRT, bicarb drip.   Junctional rhythm noted.  eICU Interventions  On maximal support.  Concerned about ability to survive illness.       Intervention Category Intermediate Interventions: Hypotension - evaluation and management  Mauri Brooklyn, P 02/06/2021, 8:57 PM

## 2021-02-13 NOTE — Progress Notes (Signed)
Pharmacy Antibiotic Note  [name] is a 70 y.o. male admitted on 02/11/2021 with aspiration pneumonia.  Pharmacy has been consulted for Unasyn dosing.  Plan: Unasyn 3g IV q24h -Monitor renal function, clinical status, and antibiotic plan  Height: 5\' 7"  (170.2 cm) Weight: 90.7 kg (200 lb) IBW/kg (Calculated) : 66.1  Temp (24hrs), Avg:97.6 F (36.4 C), Min:97.6 F (36.4 C), Max:97.6 F (36.4 C)  Recent Labs  Lab 02/07/21 1348 02/10/21 1431 02/05/2021 0148  WBC 5.9 6.5 10.7*  CREATININE 4.78* 6.12* 4.04*    Estimated Creatinine Clearance: 18.3 mL/min (A) (by C-G formula based on SCr of 4.04 mg/dL (H)).    No Known Allergies  Antimicrobials this admission: Unasyn 12/9 >>   Thank you for allowing pharmacy to be a part of this patient's care.  Joetta Manners, PharmD, [facility] Emergency Medicine Clinical Pharmacist ED RPh Phone: [place]: [phone]

## 2021-02-13 NOTE — Procedures (Signed)
Central Venous Catheter Insertion Procedure Note  [name]  [id number]  02-12-1951  Date:02/15/2021  Time:11:59 AM   Provider Performing:Lindzie Boxx Loletha Grayer Tamala Julian   Procedure: Insertion of Non-tunneled Central Venous Catheter(36556) with US guidance (37943)   Indication(s) Difficult access  Consent Unable to obtain consent due to emergent nature of procedure.  Anesthesia Topical only with 1% lidocaine   Timeout Verified patient identification, verified procedure, site/side was marked, verified correct patient position, special equipment/implants available, medications/allergies/relevant history reviewed, required imaging and test results available.  Sterile Technique Maximal sterile technique including full sterile barrier drape, hand hygiene, sterile gown, sterile gloves, mask, hair covering, sterile ultrasound probe cover (if used).  Procedure Description Area of catheter insertion was cleaned with chlorhexidine and draped in sterile fashion.  With real-time ultrasound guidance a central venous catheter was placed into the left internal jugular vein. Pulsatile venous blood flow and easy flushing noted in all ports.  The catheter was sutured in place and sterile dressing applied.  Complications/Tolerance None; patient tolerated the procedure well. Chest X-ray is ordered to verify placement for internal jugular or subclavian cannulation.   Chest x-ray is not ordered for femoral cannulation.  EBL Minimal  Specimen(s) None

## 2021-02-13 NOTE — Progress Notes (Signed)
EEG complete - results pending 

## 2021-02-14 ENCOUNTER — Inpatient Hospital Stay (HOSPITAL_COMMUNITY): Payer: Medicare Other

## 2021-02-14 LAB — HEPARIN LEVEL (UNFRACTIONATED): Heparin Unfractionated: 0.38 IU/mL (ref 0.30–0.70)

## 2021-02-14 MED ORDER — SODIUM CHLORIDE 0.9% FLUSH: 10.0000 mL | Freq: Two times a day (BID) | INTRAVENOUS | Status: DC

## 2021-02-14 MED ORDER — SODIUM CHLORIDE 0.9% FLUSH: 10.0000 mL | INTRAVENOUS | Status: DC | PRN

## 2021-02-17 ENCOUNTER — Encounter (HOSPITAL_COMMUNITY): Payer: Medicare Other

## 2021-03-05 ENCOUNTER — Other Ambulatory Visit: Payer: Self-pay | Admitting: Physical Medicine and Rehabilitation

## 2021-03-08 NOTE — Progress Notes (Signed)
ANTICOAGULATION CONSULT NOTE - Follow Up Consult  Pharmacy Consult for heparin Indication:  ACS  Labs: Recent Labs    02/28/2021 0148 02/25/2021 0359 02/28/2021 0919 03/01/2021 1309 02/07/2021 1626 03/01/2021 1951 02/25/2021 2055 02/12/2021 2344  HGB 9.2*  --    < >  --  9.2* 9.5*  --  9.5*  HCT 29.8*  --    < >  --  27.0* 28.0*  --  28.0*  PLT 220  --   --   --   --   --   --   --   HEPARINUNFRC  --   --   --  0.28*  --   --   --  0.38  CREATININE 4.04*  --   --   --   --   --  4.31*  --   TROPONINIHS 571* 782*  --   --   --   --   --   --    < > = values in this interval not displayed.    Assessment/Plan:  71yo male therapeutic on heparin after rate change. Will continue infusion at current rate of 1400 units/hr and monitor daily level.   Wynona Neat, PharmD, BCPS  2021-02-25,12:42 AM

## 2021-03-08 NOTE — Procedures (Signed)
Patient Name: MACLAIN COHRON  MRN: [id number]  Epilepsy Attending: Lora Havens  Referring Physician/Provider: Dr Ina Homes Duration: 02/06/2021 1704 to 2021-03-04 0300   Patient history: 71yo M s/p cardiac arrest. EEG to evaluate for seizure   Level of alertness: comatose   AEDs during EEG study: None   Technical aspects: This EEG study was done with scalp electrodes positioned according to the 10-20 International system of electrode placement. Electrical activity was acquired at a sampling rate of 500Hz  and reviewed with a high frequency filter of 70Hz  and a low frequency filter of 1Hz . EEG data were recorded continuously and digitally stored.    Description: EEG initially showed continuous generalized low amplitude 3 to 6 Hz theta-delta slowing. Gradually after around 0300 on 03/04/21 EEG showed continuous generalized background attenuation. Hyperventilation and photic stimulation were not performed.      ABNORMALITY - Continuous slow, generalized   IMPRESSION: This study was initially suggestive of severe diffuse encephalopathy. After around 0300 on 03/04/21, EEG showed profound diffuse encephalopathy. 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 No seizures or epileptiform discharges were seen throughout the recording.   Ardis Fullwood Barbra Sarks

## 2021-03-08 NOTE — Progress Notes (Signed)
Patient brady in the 30s, A-line reading no pulse. PEA rhythm and time of death noted to be 0310 and verified with Wynn Banker, RN. E-link and patient's son notified. Son came to pick up patients belongings including cell phone, bilateral prosthetic legs, and clothing. All questions answered and patient placement phone number given to son.

## 2021-03-08 NOTE — Progress Notes (Signed)
eLink Physician-Brief Progress Note Patient Name: [name] DOB: 16-Feb-1951 MRN: [id number]   Date of Service  2021-02-28  HPI/Events of Note  [name] condition has continued to decline.  He has progressive shock and acidemia despite bicarb drip, CRRT, levophed, vasopressin, and epi drips.  He now is developing bradycardia.  I do not feel his condition is survivable.  I discussed this with his son Venita Lick.  He understands.  Given the severity of his illness we agreed that resuscitation in the event of cardiac arrest including CPR, defibrillation, or other ACLS protocol would not improve his chance of recovery but only prolong his dying process.  Thus the patient is do not resuscitate.  eICU Interventions  DNR Continue present care     Intervention Category Major Interventions: End of life / care limitation discussion  Mauri Brooklyn, P 02-28-21, 12:20 AM

## 2021-03-08 NOTE — Death Summary Note (Signed)
DEATH SUMMARY   Patient Details  Name: [name] MRN: [id number] DOB: [birth year]  Admission/Discharge Information   Admit Date:  2021-02-26  Date of Death: Date of Death: 27-Feb-2021  Time of Death: Time of Death: 0310  Length of Stay: 1  Referring Physician: [name], MD   Reason(s) for Hospitalization  Cardiac arrest suspected secondary to hypoglycemia Aspiration pneumonia Acute renal failure Acute heart failure ESRD on HD  Brief Hospital Course (including significant findings, care, treatment, and services provided and events leading to death)  71 year old man with hx of CAD, ESRD on HD presenting with feeling unwell, hypoxemia.  This is in setting of not eating for multiple days and progressive lethargy, hypoglycemia.  Arrested in emergency room, went into profound multiorgan failure.  Eventually succumbed to his illness day 1 of admission.  Pertinent Labs and Studies  Significant Diagnostic Studies DG Chest 1 View  Result Date: 02-26-2021 CLINICAL DATA:  Endotracheal tube, central line placement EXAM: CHEST  1 VIEW COMPARISON:  Feb 26, 2021 FINDINGS: Interval placement of left neck vascular catheter, tip projecting over the superior vena cava. Support apparatus is otherwise unchanged including endotracheal tube and large-bore right neck multi lumen vascular catheter. Cardiomegaly status post median sternotomy and CABG. Unchanged diffuse bilateral interstitial and heterogeneous airspace opacity. IMPRESSION: 1. Interval placement of left neck vascular catheter, tip projecting over the superior vena cava. 2. Support apparatus is otherwise unchanged. 3. Unchanged diffuse bilateral interstitial and heterogeneous airspace opacity. Electronically Signed   By: Delanna Ahmadi M.D.   On: 2021/02/26 12:20   CT HEAD WO CONTRAST (5MM)  Result Date: 02-26-21 CLINICAL DATA:  Anoxic brain damage EXAM: CT HEAD WITHOUT CONTRAST TECHNIQUE: Contiguous axial images were obtained from the  base of the skull through the vertex without intravenous contrast. COMPARISON:  CT head 07/27/2012, correlation is also made with MRI head 01/22/2021 FINDINGS: Brain: Hypodensity in the right periventricular white matter, with redemonstrated remote right basal ganglia infarct, which appear unchanged from the 01/22/2021 MRI. No definite hypodense correlate is seen for the left pontine and right corona radiata infarcts seen on left MRI. Redemonstrated remote infarct in the right pons and left cerebellum. No acute infarct, hemorrhage, mass, mass effect, or midline shift. Gray-white differentiation is preserved. No extra-axial collection or hydrocephalus. Vascular: No hyperdense vessel. Atherosclerotic calcifications in the intracranial carotid and vertebral arteries. Skull: No acute osseous abnormality. Sinuses/Orbits: Mucosal thickening in the ethmoid air cells. Status post bilateral lens replacements. Other: The mastoids are well aerated. IMPRESSION: No acute intracranial process. Redemonstrated sequela of more remote infarcts. Electronically Signed   By: Merilyn Baba M.D.   On: 02-26-21 14:46   CT Angio Chest Pulmonary Embolism (PE) W or WO Contrast  Result Date: 26-Feb-2021 CLINICAL DATA:  71 year old male with history of generalized malaise and weakness. EXAM: CT ANGIOGRAPHY CHEST WITH CONTRAST TECHNIQUE: Multidetector CT imaging of the chest was performed using the standard protocol during bolus administration of intravenous contrast. Multiplanar CT image reconstructions and MIPs were obtained to evaluate the vascular anatomy. CONTRAST:  57mL OMNIPAQUE IOHEXOL 350 MG/ML SOLN COMPARISON:  No priors. FINDINGS: Cardiovascular: No filling defect within the pulmonary arterial tree to suggest pulmonary embolism. Heart size is mildly enlarged. There is no significant pericardial fluid, thickening or pericardial calcification. There is aortic atherosclerosis, as well as atherosclerosis of the great vessels of the  mediastinum and the coronary arteries, including calcified atherosclerotic plaque in the left main, left anterior descending, left circumflex and right coronary arteries. Status  post median sternotomy for CABG including LIMA to the LAD. Left-sided internal jugular central venous catheter with tip terminating in the distal superior vena cava. Right internal jugular PermCath with tip terminating in the right atrium. Mediastinum/Nodes: Mediastinal or no pathologically enlarged hilar lymph nodes. Please note that accurate exclusion of hilar adenopathy is limited on noncontrast CT scans. Patient is intubated, with the tip of the endotracheal tube approximately 2.6 cm above the carina. Lungs/Pleura: Widespread areas of airspace consolidation are noted throughout the lungs bilaterally, most severe in the posterior aspect of the right upper lobe and dependent portions of the lower lobes. No pleural effusions. Upper Abdomen: Numerous calcified gallstones in the gallbladder. Musculoskeletal: Median sternotomy wires. There are no aggressive appearing lytic or blastic lesions noted in the visualized portions of the skeleton. Review of the MIP images confirms the above findings. IMPRESSION: 1. No evidence of pulmonary embolism. 2. Severe multilobar bilateral pneumonia. 3. Aortic atherosclerosis, in addition to left main and 3 vessel coronary artery disease. Status post median sternotomy for CABG including LIMA to the LAD. 4. Cholelithiasis. Aortic Atherosclerosis (ICD10-I70.0). Electronically Signed   By: Vinnie Langton M.D.   On: 02/07/2021 14:46   DG CHEST PORT 1 VIEW  Result Date: 02/18/2021 CLINICAL DATA:  Pneumothorax EXAM: PORTABLE CHEST 1 VIEW COMPARISON:  Radiograph 02/24/2021 FINDINGS: Endotracheal tube overlies the midthoracic trachea. Central venous catheter tip overlies the right atrium. Orogastric tube tip overlies the thoracic inlet, unchanged. Unchanged enlarged cardiac silhouette with prior median  sternotomy and CABG. Increased right mid to upper lung airspace disease. Small bilateral pleural effusions. No visible pneumothorax. Bones are unchanged. IMPRESSION: Increased right mid to upper lung airspace disease. Stable small effusions. Unchanged orogastric tube tip overlying the thoracic inlet along side the endotracheal tube. Suggest repositioning. No evidence of pneumothorax. Electronically Signed   By: Maurine Simmering M.D.   On: 02/20/2021 10:33   DG Chest Portable 1 View  Result Date: 02/12/2021 CLINICAL DATA:  Intubation. EXAM: PORTABLE CHEST 1 VIEW COMPARISON:  03/07/2021 FINDINGS: Endotracheal tube tip approximately 2 cm above the carina. NG tip is alongside the endotracheal tube at the thoracic inlet. NG could be in the proximal esophagus or the trachea. Right jugular central venous catheter tip in the right atrium unchanged. Cardiac enlargement without vascular congestion or edema. Mild bibasilar airspace disease unchanged. No effusion. IMPRESSION: Endotracheal tube tip 2 cm above the carina NG tube alongside the endotracheal tube at the thoracic inlet. NG could be in the trachea or proximal esophagus. Cardiac enlargement without fluid overload. Bibasilar atelectasis/infiltrate unchanged. Electronically Signed   By: Franchot Gallo M.D.   On: 02/08/2021 08:01   DG Chest Port 1 View  Result Date: 02/21/2021 CLINICAL DATA:  Shortness of breath. EXAM: PORTABLE CHEST 1 VIEW COMPARISON:  02/06/2021. FINDINGS: The heart is enlarged there is evidence of prior cardiothoracic surgery. Atherosclerotic calcification of the aorta is noted. The pulmonary vasculature is within normal limits. Lung volumes are low. Linear atelectasis or scarring is noted in the mid right lung and unchanged from the previous exam. No effusion or pneumothorax. A stable right internal jugular central venous catheter is noted. IMPRESSION: Cardiomegaly with no acute cardiopulmonary process. Electronically Signed   By: Brett Fairy  M.D.   On: 02/24/2021 01:47   DG CHEST PORT 1 VIEW  Result Date: 02/06/2021 CLINICAL DATA:  Fever, on dialysis EXAM: PORTABLE CHEST 1 VIEW COMPARISON:  01/22/2021 FINDINGS: Right dialysis catheter is unchanged. Persistent linear atelectasis/scarring mid right lung. Probable minimal atelectasis  right lung base. No pleural effusion. Stable cardiomegaly. IMPRESSION: No acute process in the chest. Electronically Signed   By: Macy Mis M.D.   On: 02/06/2021 12:45   DG Abd Portable 1V  Result Date: 02/07/2021 CLINICAL DATA:  Feeding tube placement EXAM: PORTABLE ABDOMEN - 1 VIEW COMPARISON:  None. FINDINGS: Esophageal tube tip projects over the distal stomach. Mild air-filled central small bowel with scattered colon gas. Moderate stool in the colon. No radiopaque calculi. IMPRESSION: 1. Esophageal tube tip overlies distal stomach. 2. Mild air-filled small bowel centrally with colon gas present suggestive of a mild ileus Electronically Signed   By: Donavan Foil M.D.   On: 02/11/2021 16:51   EEG adult  Result Date: 02/05/2021 Lora Havens, MD     02/25/2021  5:08 PM Patient Name: [name] MRN: [id number] Epilepsy Attending: Lora Havens Referring Physician/Provider: Dr Ina Homes Date: 02/22/2021 Duration: 22.45 mins Patient history: 71yo M s/p cardiac arrest. EEG to evaluate for seizure Level of alertness: comatose AEDs during EEG study: None Technical aspects: This EEG study was done with scalp electrodes positioned according to the 10-20 International system of electrode placement. Electrical activity was acquired at a sampling rate of 500Hz  and reviewed with a high frequency filter of 70Hz  and a low frequency filter of 1Hz . EEG data were recorded continuously and digitally stored. Description: EEG showed continuous generalized low amplitude 3 to 6 Hz theta-delta slowing. Hyperventilation and photic stimulation were not performed.   ABNORMALITY - Continuous slow, generalized IMPRESSION:  This study is suggestive of severe diffuse encephalopathy, nonspecific etiology. No seizures or epileptiform discharges were seen throughout the recording. [name]   Overnight EEG with video  Result Date: Mar 10, 2021 Lora Havens, MD     2021-03-10  8:24 AM Patient Name: [name] MRN: [id number] Epilepsy Attending: Lora Havens Referring Physician/Provider: Dr Ina Homes Duration: 02/19/2021 1704 to Mar 10, 2021 0300  Patient history: 71yo M s/p cardiac arrest. EEG to evaluate for seizure  Level of alertness: comatose  AEDs during EEG study: None  Technical aspects: This EEG study was done with scalp electrodes positioned according to the 10-20 International system of electrode placement. Electrical activity was acquired at a sampling rate of 500Hz  and reviewed with a high frequency filter of 70Hz  and a low frequency filter of 1Hz . EEG data were recorded continuously and digitally stored.  Description: EEG initially showed continuous generalized low amplitude 3 to 6 Hz theta-delta slowing. Gradually after around 0300 on 03/10/21 EEG showed continuous generalized background attenuation. Hyperventilation and photic stimulation were not performed.    ABNORMALITY - Continuous slow, generalized  IMPRESSION: This study was initially suggestive of severe diffuse encephalopathy. After around 0300 on 03/10/21, EEG showed profound diffuse encephalopathy. 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 No seizures or epileptiform discharges were seen throughout the recording.  Lora Havens   ECHOCARDIOGRAM LIMITED  Result Date: 02/09/2021    ECHOCARDIOGRAM LIMITED REPORT   Patient Name:   [name] Hirst Date of Exam: 02/17/2021 Medical Rec #:  [id number]      Height:       67.0 in Accession #:    [id number]     Weight:       200.0 lb Date of Birth:  Mar 25, 1950       BSA:          2.022 m Patient Age:    71 years  BP:           117/45 mmHg Patient  Gender: M              HR:           90 bpm. Exam Location:  Inpatient Procedure: Limited Echo, Color Doppler and Cardiac Doppler Indications:    Cardiac Arrest  History:        Patient has prior history of Echocardiogram examinations, most                 recent 01/12/2021. Prior CABG; Risk Factors:Hypertension,                 Diabetes, Dyslipidemia and ESRD.  Sonographer:    Raquel Sarna Senior RDCS Referring Phys: [id number] Candee Furbish  Sonographer Comments: Scanned supine on artificial respirator.  Conclusion(s)/Recommendation(s): Bi-Ventricular severe hypokinesis. LV and RV are significantly dilated. No evidence of LV thrombus. Moderate MR appears to be posteriorly directed. Mild TR. IVC dialted, RA pressure approx. 15. LEFT VENTRICLE PLAX 2D LVOT diam:     2.20 cm LV SV:         31 LV SV Index:   15 LVOT Area:     3.80 cm  LV Volumes (MOD) LV vol d, MOD A2C: 215.0 ml LV vol d, MOD A4C: 220.0 ml LV vol s, MOD A2C: 180.0 ml LV vol s, MOD A4C: 196.0 ml LV SV MOD A2C:     35.0 ml LV SV MOD A4C:     220.0 ml LV SV MOD BP:      27.0 ml AORTIC VALVE LVOT Vmax:   67.20 cm/s LVOT Vmean:  46.500 cm/s LVOT VTI:    0.082 m TRICUSPID VALVE TR Peak grad:   11.3 mmHg TR Vmax:        168.00 cm/s  SHUNTS Systemic VTI:  0.08 m Systemic Diam: 2.20 cm Phineas Inches Electronically signed by Phineas Inches Signature Date/Time: 02/28/2021/4:03:58 PM    Final     Microbiology No results found for this or any previous visit (from the past 240 hour(s)).  Lab Basic Metabolic Panel: No results for input(s): NA, K, CL, CO2, GLUCOSE, BUN, CREATININE, CALCIUM, MG, PHOS in the last 168 hours. Liver Function Tests: No results for input(s): AST, ALT, ALKPHOS, BILITOT, PROT, ALBUMIN in the last 168 hours. No results for input(s): LIPASE, AMYLASE in the last 168 hours. No results for input(s): AMMONIA in the last 168 hours. CBC: No results for input(s): WBC, NEUTROABS, HGB, HCT, MCV, PLT in the last 168 hours. Cardiac Enzymes: No results for  input(s): CKTOTAL, CKMB, CKMBINDEX, TROPONINI in the last 168 hours. Sepsis Labs: No results for input(s): PROCALCITON, WBC, LATICACIDVEN in the last 168 hours.   Candee Furbish 03/06/2021, 1:53 PM

## 2021-03-08 DEATH — deceased

## 2021-03-13 ENCOUNTER — Other Ambulatory Visit: Payer: Self-pay | Admitting: Physical Medicine and Rehabilitation

## 2021-03-16 ENCOUNTER — Inpatient Hospital Stay: Payer: Medicare Other | Admitting: Registered Nurse

## 2021-04-08 ENCOUNTER — Ambulatory Visit (HOSPITAL_BASED_OUTPATIENT_CLINIC_OR_DEPARTMENT_OTHER): Payer: Medicare Other | Admitting: Cardiovascular Disease
